# Patient Record
Sex: Female | Born: 1935
Health system: Southern US, Community
[De-identification: ages and names within clinical notes are randomized; demographics above are authoritative.]

## PROBLEM LIST (undated history)

## (undated) DIAGNOSIS — M8580 Other specified disorders of bone density and structure, unspecified site: Secondary | ICD-10-CM

## (undated) DIAGNOSIS — R42 Dizziness and giddiness: Secondary | ICD-10-CM

## (undated) DIAGNOSIS — M19012 Primary osteoarthritis, left shoulder: Secondary | ICD-10-CM

## (undated) DIAGNOSIS — K219 Gastro-esophageal reflux disease without esophagitis: Secondary | ICD-10-CM

## (undated) DIAGNOSIS — K449 Diaphragmatic hernia without obstruction or gangrene: Secondary | ICD-10-CM

## (undated) DIAGNOSIS — Z86718 Personal history of other venous thrombosis and embolism: Secondary | ICD-10-CM

## (undated) DIAGNOSIS — M79672 Pain in left foot: Secondary | ICD-10-CM

## (undated) DIAGNOSIS — J449 Chronic obstructive pulmonary disease, unspecified: Secondary | ICD-10-CM

## (undated) DIAGNOSIS — R32 Unspecified urinary incontinence: Secondary | ICD-10-CM

## (undated) DIAGNOSIS — K589 Irritable bowel syndrome without diarrhea: Secondary | ICD-10-CM

## (undated) DIAGNOSIS — E2740 Unspecified adrenocortical insufficiency: Secondary | ICD-10-CM

## (undated) DIAGNOSIS — J40 Bronchitis, not specified as acute or chronic: Secondary | ICD-10-CM

## (undated) DIAGNOSIS — C801 Malignant (primary) neoplasm, unspecified: Secondary | ICD-10-CM

## (undated) DIAGNOSIS — F32A Depression, unspecified: Secondary | ICD-10-CM

## (undated) DIAGNOSIS — M1990 Unspecified osteoarthritis, unspecified site: Secondary | ICD-10-CM

## (undated) DIAGNOSIS — R011 Cardiac murmur, unspecified: Secondary | ICD-10-CM

## (undated) DIAGNOSIS — R0602 Shortness of breath: Secondary | ICD-10-CM

## (undated) DIAGNOSIS — I209 Angina pectoris, unspecified: Secondary | ICD-10-CM

## (undated) DIAGNOSIS — I10 Essential (primary) hypertension: Secondary | ICD-10-CM

## (undated) DIAGNOSIS — I2699 Other pulmonary embolism without acute cor pulmonale: Secondary | ICD-10-CM

## (undated) DIAGNOSIS — D759 Disease of blood and blood-forming organs, unspecified: Secondary | ICD-10-CM

## (undated) DIAGNOSIS — M069 Rheumatoid arthritis, unspecified: Secondary | ICD-10-CM

## (undated) DIAGNOSIS — E538 Deficiency of other specified B group vitamins: Secondary | ICD-10-CM

## (undated) DIAGNOSIS — H269 Unspecified cataract: Secondary | ICD-10-CM

## (undated) DIAGNOSIS — Z9289 Personal history of other medical treatment: Secondary | ICD-10-CM

## (undated) DIAGNOSIS — M797 Fibromyalgia: Secondary | ICD-10-CM

## (undated) DIAGNOSIS — I38 Endocarditis, valve unspecified: Secondary | ICD-10-CM

## (undated) DIAGNOSIS — D649 Anemia, unspecified: Secondary | ICD-10-CM

## (undated) DIAGNOSIS — K760 Fatty (change of) liver, not elsewhere classified: Secondary | ICD-10-CM

## (undated) DIAGNOSIS — F329 Major depressive disorder, single episode, unspecified: Secondary | ICD-10-CM

## (undated) HISTORY — PX: WRIST SURGERY: SHX841

## (undated) HISTORY — DX: Gastro-esophageal reflux disease without esophagitis: K21.9

## (undated) HISTORY — DX: Rheumatoid arthritis, unspecified: M06.9

## (undated) HISTORY — DX: Personal history of other venous thrombosis and embolism: Z86.718

## (undated) HISTORY — DX: Diaphragmatic hernia without obstruction or gangrene: K44.9

## (undated) HISTORY — DX: Chronic obstructive pulmonary disease, unspecified: J44.9

## (undated) HISTORY — DX: Deficiency of other specified B group vitamins: E53.8

## (undated) HISTORY — DX: Other pulmonary embolism without acute cor pulmonale: I26.99

## (undated) HISTORY — PX: SKIN CANCER EXCISION: SHX779

## (undated) HISTORY — PX: ABDOMINAL HYSTERECTOMY: SHX81

## (undated) HISTORY — PX: TOTAL KNEE ARTHROPLASTY: SHX125

## (undated) HISTORY — PX: COSMETIC SURGERY: SHX468

## (undated) HISTORY — PX: JOINT REPLACEMENT: SHX530

## (undated) HISTORY — DX: Fatty (change of) liver, not elsewhere classified: K76.0

## (undated) HISTORY — PX: CATARACT EXTRACTION: SUR2

## (undated) HISTORY — DX: Essential (primary) hypertension: I10

## (undated) HISTORY — PX: BREAST BIOPSY: SHX20

## (undated) HISTORY — PX: BILATERAL OOPHORECTOMY: SHX1221

## (undated) HISTORY — DX: Personal history of other medical treatment: Z92.89

## (undated) HISTORY — DX: Fibromyalgia: M79.7

## (undated) HISTORY — DX: Unspecified adrenocortical insufficiency: E27.40

## (undated) HISTORY — DX: Depression, unspecified: F32.A

## (undated) HISTORY — DX: Pain in left foot: M79.672

## (undated) HISTORY — DX: Other specified disorders of bone density and structure, unspecified site: M85.80

## (undated) HISTORY — DX: Irritable bowel syndrome, unspecified: K58.9

## (undated) HISTORY — DX: Major depressive disorder, single episode, unspecified: F32.9

## (undated) HISTORY — PX: COLONOSCOPY: SHX174

## (undated) HISTORY — DX: Unspecified osteoarthritis, unspecified site: M19.90

---
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
## 1999-07-03 ENCOUNTER — Other Ambulatory Visit: Admission: RE | Admit: 1999-07-03 | Discharge: 1999-07-03 | Payer: Self-pay | Admitting: Otolaryngology

## 1999-08-14 ENCOUNTER — Encounter: Admission: RE | Admit: 1999-08-14 | Discharge: 1999-08-14 | Payer: Self-pay | Admitting: *Deleted

## 1999-08-14 ENCOUNTER — Encounter: Payer: Self-pay | Admitting: *Deleted

## 2000-03-26 ENCOUNTER — Encounter: Payer: Self-pay | Admitting: Internal Medicine

## 2000-03-26 ENCOUNTER — Ambulatory Visit (HOSPITAL_COMMUNITY): Admission: RE | Admit: 2000-03-26 | Discharge: 2000-03-26 | Payer: Self-pay | Admitting: Internal Medicine

## 2000-03-27 ENCOUNTER — Ambulatory Visit (HOSPITAL_COMMUNITY): Admission: RE | Admit: 2000-03-27 | Discharge: 2000-03-27 | Payer: Self-pay | Admitting: Internal Medicine

## 2000-03-27 ENCOUNTER — Encounter: Payer: Self-pay | Admitting: Internal Medicine

## 2000-04-22 ENCOUNTER — Encounter: Payer: Self-pay | Admitting: Internal Medicine

## 2000-04-29 ENCOUNTER — Encounter: Payer: Self-pay | Admitting: Oncology

## 2000-04-29 ENCOUNTER — Encounter: Admission: RE | Admit: 2000-04-29 | Discharge: 2000-04-29 | Payer: Self-pay | Admitting: Oncology

## 2000-05-14 ENCOUNTER — Ambulatory Visit (HOSPITAL_COMMUNITY): Admission: RE | Admit: 2000-05-14 | Discharge: 2000-05-14 | Payer: Self-pay | Admitting: Oncology

## 2000-05-14 ENCOUNTER — Encounter (INDEPENDENT_AMBULATORY_CARE_PROVIDER_SITE_OTHER): Payer: Self-pay | Admitting: Specialist

## 2000-09-17 ENCOUNTER — Inpatient Hospital Stay (HOSPITAL_COMMUNITY): Admission: EM | Admit: 2000-09-17 | Discharge: 2000-09-24 | Payer: Self-pay | Admitting: Emergency Medicine

## 2000-09-17 ENCOUNTER — Encounter: Payer: Self-pay | Admitting: Emergency Medicine

## 2001-05-04 HISTORY — PX: CHOLECYSTECTOMY: SHX55

## 2001-09-01 ENCOUNTER — Encounter: Admission: RE | Admit: 2001-09-01 | Discharge: 2001-09-01 | Payer: Self-pay | Admitting: Oncology

## 2001-09-01 ENCOUNTER — Encounter: Payer: Self-pay | Admitting: Oncology

## 2001-09-02 ENCOUNTER — Encounter (INDEPENDENT_AMBULATORY_CARE_PROVIDER_SITE_OTHER): Payer: Self-pay | Admitting: Specialist

## 2001-09-02 ENCOUNTER — Inpatient Hospital Stay (HOSPITAL_COMMUNITY): Admission: EM | Admit: 2001-09-02 | Discharge: 2001-09-08 | Payer: Self-pay | Admitting: Emergency Medicine

## 2001-09-03 ENCOUNTER — Encounter: Payer: Self-pay | Admitting: Internal Medicine

## 2001-09-30 ENCOUNTER — Encounter: Admission: RE | Admit: 2001-09-30 | Discharge: 2001-09-30 | Payer: Self-pay | Admitting: General Surgery

## 2001-09-30 ENCOUNTER — Encounter: Payer: Self-pay | Admitting: General Surgery

## 2001-11-15 ENCOUNTER — Ambulatory Visit (HOSPITAL_COMMUNITY): Admission: RE | Admit: 2001-11-15 | Discharge: 2001-11-15 | Payer: Self-pay | Admitting: Oncology

## 2001-11-15 ENCOUNTER — Encounter (INDEPENDENT_AMBULATORY_CARE_PROVIDER_SITE_OTHER): Payer: Self-pay | Admitting: Specialist

## 2002-02-23 ENCOUNTER — Encounter: Payer: Self-pay | Admitting: Internal Medicine

## 2002-02-23 ENCOUNTER — Encounter: Admission: RE | Admit: 2002-02-23 | Discharge: 2002-02-23 | Payer: Self-pay | Admitting: Internal Medicine

## 2002-11-23 ENCOUNTER — Encounter: Payer: Self-pay | Admitting: Emergency Medicine

## 2002-11-23 ENCOUNTER — Emergency Department (HOSPITAL_COMMUNITY): Admission: EM | Admit: 2002-11-23 | Discharge: 2002-11-23 | Payer: Self-pay | Admitting: Emergency Medicine

## 2003-07-31 ENCOUNTER — Encounter: Admission: RE | Admit: 2003-07-31 | Discharge: 2003-07-31 | Payer: Self-pay | Admitting: Internal Medicine

## 2003-08-06 ENCOUNTER — Inpatient Hospital Stay (HOSPITAL_COMMUNITY): Admission: RE | Admit: 2003-08-06 | Discharge: 2003-08-10 | Payer: Self-pay | Admitting: Orthopedic Surgery

## 2004-01-10 ENCOUNTER — Ambulatory Visit: Admission: RE | Admit: 2004-01-10 | Discharge: 2004-01-10 | Payer: Self-pay | Admitting: Orthopedic Surgery

## 2004-01-28 ENCOUNTER — Inpatient Hospital Stay (HOSPITAL_COMMUNITY): Admission: RE | Admit: 2004-01-28 | Discharge: 2004-02-01 | Payer: Self-pay | Admitting: Orthopedic Surgery

## 2004-03-17 ENCOUNTER — Ambulatory Visit: Payer: Self-pay | Admitting: Oncology

## 2004-03-24 ENCOUNTER — Ambulatory Visit: Payer: Self-pay | Admitting: Internal Medicine

## 2004-04-18 ENCOUNTER — Ambulatory Visit (HOSPITAL_COMMUNITY): Admission: RE | Admit: 2004-04-18 | Discharge: 2004-04-18 | Payer: Self-pay | Admitting: Hematology & Oncology

## 2004-05-02 ENCOUNTER — Ambulatory Visit: Payer: Self-pay | Admitting: Oncology

## 2004-05-20 ENCOUNTER — Ambulatory Visit: Payer: Self-pay | Admitting: Internal Medicine

## 2004-06-18 ENCOUNTER — Ambulatory Visit: Payer: Self-pay | Admitting: Oncology

## 2004-06-20 ENCOUNTER — Ambulatory Visit: Payer: Self-pay | Admitting: Internal Medicine

## 2004-06-25 ENCOUNTER — Ambulatory Visit (HOSPITAL_COMMUNITY): Admission: RE | Admit: 2004-06-25 | Discharge: 2004-06-25 | Payer: Self-pay | Admitting: Internal Medicine

## 2004-07-23 ENCOUNTER — Ambulatory Visit: Payer: Self-pay | Admitting: Internal Medicine

## 2004-08-06 ENCOUNTER — Ambulatory Visit: Payer: Self-pay | Admitting: Oncology

## 2004-08-29 ENCOUNTER — Encounter: Admission: RE | Admit: 2004-08-29 | Discharge: 2004-08-29 | Payer: Self-pay | Admitting: Oncology

## 2004-09-02 ENCOUNTER — Ambulatory Visit: Payer: Self-pay | Admitting: Internal Medicine

## 2004-09-18 ENCOUNTER — Ambulatory Visit: Payer: Self-pay | Admitting: Endocrinology

## 2004-09-22 ENCOUNTER — Ambulatory Visit: Payer: Self-pay | Admitting: Oncology

## 2004-10-01 ENCOUNTER — Encounter: Admission: RE | Admit: 2004-10-01 | Discharge: 2004-10-01 | Payer: Self-pay | Admitting: Oncology

## 2004-10-08 ENCOUNTER — Ambulatory Visit: Payer: Self-pay | Admitting: Internal Medicine

## 2004-10-22 ENCOUNTER — Ambulatory Visit: Payer: Self-pay | Admitting: Internal Medicine

## 2004-11-11 ENCOUNTER — Ambulatory Visit: Payer: Self-pay | Admitting: Oncology

## 2004-12-03 ENCOUNTER — Ambulatory Visit: Payer: Self-pay | Admitting: Internal Medicine

## 2005-02-04 ENCOUNTER — Ambulatory Visit: Payer: Self-pay | Admitting: Internal Medicine

## 2005-03-04 ENCOUNTER — Ambulatory Visit: Payer: Self-pay | Admitting: Internal Medicine

## 2005-03-12 ENCOUNTER — Ambulatory Visit: Payer: Self-pay | Admitting: Internal Medicine

## 2005-03-17 ENCOUNTER — Ambulatory Visit: Payer: Self-pay | Admitting: Oncology

## 2005-03-23 ENCOUNTER — Ambulatory Visit: Payer: Self-pay | Admitting: Internal Medicine

## 2005-04-08 ENCOUNTER — Ambulatory Visit (HOSPITAL_COMMUNITY): Admission: RE | Admit: 2005-04-08 | Discharge: 2005-04-08 | Payer: Self-pay | Admitting: Internal Medicine

## 2005-04-08 ENCOUNTER — Ambulatory Visit: Payer: Self-pay | Admitting: Internal Medicine

## 2005-05-27 ENCOUNTER — Ambulatory Visit: Payer: Self-pay | Admitting: Internal Medicine

## 2005-05-30 ENCOUNTER — Emergency Department (HOSPITAL_COMMUNITY): Admission: EM | Admit: 2005-05-30 | Discharge: 2005-05-30 | Payer: Self-pay | Admitting: *Deleted

## 2005-06-09 ENCOUNTER — Ambulatory Visit: Payer: Self-pay | Admitting: Internal Medicine

## 2005-06-15 ENCOUNTER — Ambulatory Visit (HOSPITAL_COMMUNITY): Admission: RE | Admit: 2005-06-15 | Discharge: 2005-06-15 | Payer: Self-pay | Admitting: Oncology

## 2005-06-17 ENCOUNTER — Ambulatory Visit: Payer: Self-pay | Admitting: Oncology

## 2005-07-27 ENCOUNTER — Ambulatory Visit: Payer: Self-pay | Admitting: Internal Medicine

## 2005-08-28 ENCOUNTER — Ambulatory Visit: Payer: Self-pay | Admitting: Oncology

## 2005-08-31 LAB — CBC & DIFF AND RETIC
Basophils Absolute: 0 10*3/uL (ref 0.0–0.1)
EOS%: 2.1 % (ref 0.0–7.0)
HCT: 40.2 % (ref 34.8–46.6)
HGB: 13.7 g/dL (ref 11.6–15.9)
MCH: 32.7 pg (ref 26.0–34.0)
MCV: 95.9 fL (ref 81.0–101.0)
MONO%: 7.9 % (ref 0.0–13.0)
NEUT%: 48.6 % (ref 39.6–76.8)

## 2005-08-31 LAB — COMPREHENSIVE METABOLIC PANEL
ALT: 16 U/L (ref 0–40)
Alkaline Phosphatase: 75 U/L (ref 39–117)
Calcium: 9.3 mg/dL (ref 8.4–10.5)
Glucose, Bld: 96 mg/dL (ref 70–99)
Potassium: 4.1 mEq/L (ref 3.5–5.3)
Sodium: 138 mEq/L (ref 135–145)

## 2005-08-31 LAB — IRON AND TIBC
Iron: 97 ug/dL (ref 42–145)
UIBC: 225 ug/dL

## 2005-08-31 LAB — HEPARIN ANTI-XA: Heparin LMW: 0.11 IU/mL

## 2005-08-31 LAB — FERRITIN: Ferritin: 112 ng/mL (ref 10–291)

## 2005-08-31 LAB — LACTATE DEHYDROGENASE: LDH: 177 U/L (ref 94–250)

## 2005-08-31 LAB — CHCC SMEAR

## 2005-09-01 ENCOUNTER — Encounter: Admission: RE | Admit: 2005-09-01 | Discharge: 2005-09-01 | Payer: Self-pay | Admitting: Internal Medicine

## 2005-09-02 LAB — PROTEIN ELECTROPHORESIS, SERUM
Alpha-1-Globulin: 5.4 % — ABNORMAL HIGH (ref 2.9–4.9)
Alpha-2-Globulin: 11.2 % (ref 7.1–11.8)
Beta 2: 6 % (ref 3.2–6.5)
Beta Globulin: 5.9 % (ref 4.7–7.2)
Gamma Globulin: 14.7 % (ref 11.1–18.8)

## 2005-09-14 ENCOUNTER — Ambulatory Visit: Payer: Self-pay | Admitting: Internal Medicine

## 2005-11-02 ENCOUNTER — Ambulatory Visit: Payer: Self-pay | Admitting: Internal Medicine

## 2005-11-18 ENCOUNTER — Ambulatory Visit: Payer: Self-pay | Admitting: Internal Medicine

## 2005-12-04 ENCOUNTER — Encounter (INDEPENDENT_AMBULATORY_CARE_PROVIDER_SITE_OTHER): Payer: Self-pay | Admitting: *Deleted

## 2005-12-04 ENCOUNTER — Ambulatory Visit (HOSPITAL_BASED_OUTPATIENT_CLINIC_OR_DEPARTMENT_OTHER): Admission: RE | Admit: 2005-12-04 | Discharge: 2005-12-04 | Payer: Self-pay | Admitting: Plastic Surgery

## 2005-12-14 ENCOUNTER — Ambulatory Visit: Payer: Self-pay | Admitting: Internal Medicine

## 2006-01-20 ENCOUNTER — Ambulatory Visit: Payer: Self-pay | Admitting: Internal Medicine

## 2006-01-27 ENCOUNTER — Ambulatory Visit: Payer: Self-pay | Admitting: Oncology

## 2006-03-29 ENCOUNTER — Ambulatory Visit: Payer: Self-pay | Admitting: Internal Medicine

## 2006-04-07 ENCOUNTER — Ambulatory Visit: Payer: Self-pay | Admitting: Oncology

## 2006-04-29 LAB — PROTIME-INR: INR: 1.5 — ABNORMAL LOW (ref 2.00–3.50)

## 2006-05-04 DIAGNOSIS — Z86718 Personal history of other venous thrombosis and embolism: Secondary | ICD-10-CM

## 2006-05-04 HISTORY — DX: Personal history of other venous thrombosis and embolism: Z86.718

## 2006-05-07 LAB — CBC & DIFF AND RETIC
BASO%: 0.5 % (ref 0.0–2.0)
HCT: 37.7 % (ref 34.8–46.6)
LYMPH%: 26.9 % (ref 14.0–48.0)
MCHC: 33.3 g/dL (ref 32.0–36.0)
MCV: 95.4 fL (ref 81.0–101.0)
MONO#: 0.3 10*3/uL (ref 0.1–0.9)
MONO%: 5.7 % (ref 0.0–13.0)
NEUT%: 65.2 % (ref 39.6–76.8)
Platelets: 200 10*3/uL (ref 145–400)
RBC: 3.96 10*6/uL (ref 3.70–5.32)
Retic %: 0.8 % (ref 0.4–2.3)

## 2006-05-07 LAB — PROTIME-INR
INR: 1.3 — ABNORMAL LOW (ref 2.00–3.50)
Protime: 15.6 Seconds — ABNORMAL HIGH (ref 10.6–13.4)

## 2006-05-11 LAB — COMPREHENSIVE METABOLIC PANEL
AST: 11 U/L (ref 0–37)
Albumin: 3.9 g/dL (ref 3.5–5.2)
Alkaline Phosphatase: 77 U/L (ref 39–117)
Calcium: 8.6 mg/dL (ref 8.4–10.5)
Chloride: 108 mEq/L (ref 96–112)
Potassium: 3.5 mEq/L (ref 3.5–5.3)
Sodium: 142 mEq/L (ref 135–145)
Total Protein: 6.5 g/dL (ref 6.0–8.3)

## 2006-05-11 LAB — SPEP & IFE WITH QIG
Albumin ELP: 57.3 % (ref 55.8–66.1)
Alpha-2-Globulin: 9.6 % (ref 7.1–11.8)
Beta Globulin: 6.6 % (ref 4.7–7.2)
IgA: 200 mg/dL (ref 68–378)
IgG (Immunoglobin G), Serum: 1110 mg/dL (ref 694–1618)
Total Protein, Serum Electrophoresis: 6.5 g/dL (ref 6.0–8.3)

## 2006-05-11 LAB — IRON AND TIBC
%SAT: 32 % (ref 20–55)
TIBC: 295 ug/dL (ref 250–470)
UIBC: 201 ug/dL

## 2006-05-13 ENCOUNTER — Ambulatory Visit (HOSPITAL_COMMUNITY): Admission: RE | Admit: 2006-05-13 | Discharge: 2006-05-13 | Payer: Self-pay | Admitting: Oncology

## 2006-05-31 ENCOUNTER — Ambulatory Visit: Payer: Self-pay | Admitting: Oncology

## 2006-06-04 LAB — PROTIME-INR

## 2006-06-18 ENCOUNTER — Ambulatory Visit: Payer: Self-pay | Admitting: Internal Medicine

## 2006-06-18 LAB — CONVERTED CEMR LAB
BUN: 30 mg/dL — ABNORMAL HIGH (ref 6–23)
Calcium: 8.8 mg/dL (ref 8.4–10.5)
Chloride: 100 meq/L (ref 96–112)
Creatinine, Ser: 1.2 mg/dL (ref 0.4–1.2)
Pro B Natriuretic peptide (BNP): 33 pg/mL (ref 0.0–100.0)
Sodium: 141 meq/L (ref 135–145)
TSH: 2.98 microintl units/mL (ref 0.35–5.50)
Vit D, 1,25-Dihydroxy: 8 — ABNORMAL LOW (ref 20–57)

## 2006-06-18 LAB — PROTIME-INR
INR: 1.7 — ABNORMAL LOW (ref 2.00–3.50)
Protime: 20.4 Seconds — ABNORMAL HIGH (ref 10.6–13.4)

## 2006-07-01 LAB — PROTIME-INR: Protime: 39.6 Seconds — ABNORMAL HIGH (ref 10.6–13.4)

## 2006-07-10 ENCOUNTER — Emergency Department (HOSPITAL_COMMUNITY): Admission: EM | Admit: 2006-07-10 | Discharge: 2006-07-10 | Payer: Self-pay | Admitting: Emergency Medicine

## 2006-07-16 ENCOUNTER — Emergency Department (HOSPITAL_COMMUNITY): Admission: EM | Admit: 2006-07-16 | Discharge: 2006-07-16 | Payer: Self-pay | Admitting: Family Medicine

## 2006-07-20 ENCOUNTER — Ambulatory Visit: Payer: Self-pay | Admitting: Internal Medicine

## 2006-08-11 ENCOUNTER — Ambulatory Visit: Payer: Self-pay | Admitting: Oncology

## 2006-08-11 LAB — PROTIME-INR
INR: 2.4 (ref 2.00–3.50)
Protime: 28.8 Seconds — ABNORMAL HIGH (ref 10.6–13.4)

## 2006-08-20 LAB — CBC WITH DIFFERENTIAL/PLATELET
BASO%: 0.8 % (ref 0.0–2.0)
EOS%: 4.1 % (ref 0.0–7.0)
LYMPH%: 39 % (ref 14.0–48.0)
MCHC: 35.2 g/dL (ref 32.0–36.0)
MONO#: 0.4 10*3/uL (ref 0.1–0.9)
Platelets: 232 10*3/uL (ref 145–400)
RBC: 3.99 10*6/uL (ref 3.70–5.32)
WBC: 4.5 10*3/uL (ref 3.9–10.0)
lymph#: 1.7 10*3/uL (ref 0.9–3.3)

## 2006-08-20 LAB — PROTIME-INR
INR: 3.9 — ABNORMAL HIGH (ref 2.00–3.50)
Protime: 46.8 Seconds — ABNORMAL HIGH (ref 10.6–13.4)

## 2006-08-25 LAB — COMPREHENSIVE METABOLIC PANEL
Alkaline Phosphatase: 71 U/L (ref 39–117)
BUN: 22 mg/dL (ref 6–23)
Creatinine, Ser: 1.23 mg/dL — ABNORMAL HIGH (ref 0.40–1.20)
Glucose, Bld: 86 mg/dL (ref 70–99)
Sodium: 139 mEq/L (ref 135–145)
Total Bilirubin: 0.4 mg/dL (ref 0.3–1.2)
Total Protein: 6.6 g/dL (ref 6.0–8.3)

## 2006-08-25 LAB — IRON AND TIBC
%SAT: 13 % — ABNORMAL LOW (ref 20–55)
Iron: 33 ug/dL — ABNORMAL LOW (ref 42–145)
TIBC: 264 ug/dL (ref 250–470)
UIBC: 231 ug/dL

## 2006-08-25 LAB — FERRITIN: Ferritin: 109 ng/mL (ref 10–291)

## 2006-08-25 LAB — SPEP & IFE WITH QIG
Albumin ELP: 54.9 % — ABNORMAL LOW (ref 55.8–66.1)
Alpha-1-Globulin: 5.7 % — ABNORMAL HIGH (ref 2.9–4.9)
Alpha-2-Globulin: 11.3 % (ref 7.1–11.8)
Beta 2: 5.9 % (ref 3.2–6.5)
Beta Globulin: 6.3 % (ref 4.7–7.2)
Gamma Globulin: 15.9 % (ref 11.1–18.8)

## 2006-08-27 LAB — CBC WITH DIFFERENTIAL/PLATELET
BASO%: 1.5 % (ref 0.0–2.0)
Eosinophils Absolute: 0.1 10*3/uL (ref 0.0–0.5)
HCT: 36.4 % (ref 34.8–46.6)
LYMPH%: 39.4 % (ref 14.0–48.0)
MONO#: 0.4 10*3/uL (ref 0.1–0.9)
NEUT#: 2.1 10*3/uL (ref 1.5–6.5)
NEUT%: 47.9 % (ref 39.6–76.8)
Platelets: 187 10*3/uL (ref 145–400)
WBC: 4.4 10*3/uL (ref 3.9–10.0)
lymph#: 1.7 10*3/uL (ref 0.9–3.3)

## 2006-08-27 LAB — PROTIME-INR

## 2006-09-03 ENCOUNTER — Ambulatory Visit: Payer: Self-pay | Admitting: Internal Medicine

## 2006-09-03 LAB — CONVERTED CEMR LAB: Vit D, 1,25-Dihydroxy: 11 — ABNORMAL LOW (ref 20–57)

## 2006-09-14 LAB — PROTIME-INR: Protime: 28.8 Seconds — ABNORMAL HIGH (ref 10.6–13.4)

## 2006-09-29 ENCOUNTER — Ambulatory Visit: Payer: Self-pay | Admitting: Oncology

## 2006-09-29 ENCOUNTER — Inpatient Hospital Stay (HOSPITAL_COMMUNITY): Admission: EM | Admit: 2006-09-29 | Discharge: 2006-10-04 | Payer: Self-pay | Admitting: Emergency Medicine

## 2006-10-05 ENCOUNTER — Ambulatory Visit: Payer: Self-pay | Admitting: Oncology

## 2006-10-07 LAB — CBC WITH DIFFERENTIAL/PLATELET
BASO%: 3.1 % — ABNORMAL HIGH (ref 0.0–2.0)
Eosinophils Absolute: 0.2 10*3/uL (ref 0.0–0.5)
HCT: 34.1 % — ABNORMAL LOW (ref 34.8–46.6)
LYMPH%: 36.7 % (ref 14.0–48.0)
MCHC: 35.1 g/dL (ref 32.0–36.0)
MONO#: 0.6 10*3/uL (ref 0.1–0.9)
NEUT#: 2 10*3/uL (ref 1.5–6.5)
Platelets: 195 10*3/uL (ref 145–400)
RBC: 3.57 10*6/uL — ABNORMAL LOW (ref 3.70–5.32)
WBC: 4.7 10*3/uL (ref 3.9–10.0)
lymph#: 1.7 10*3/uL (ref 0.9–3.3)

## 2006-10-07 LAB — PROTIME-INR

## 2006-10-14 LAB — CBC WITH DIFFERENTIAL/PLATELET
BASO%: 1.5 % (ref 0.0–2.0)
HCT: 32 % — ABNORMAL LOW (ref 34.8–46.6)
HGB: 11.1 g/dL — ABNORMAL LOW (ref 11.6–15.9)
MCHC: 34.7 g/dL (ref 32.0–36.0)
MONO#: 0.7 10*3/uL (ref 0.1–0.9)
NEUT%: 41.7 % (ref 39.6–76.8)
WBC: 5.7 10*3/uL (ref 3.9–10.0)
lymph#: 2.3 10*3/uL (ref 0.9–3.3)

## 2006-10-14 LAB — PROTIME-INR: INR: 3.3 (ref 2.00–3.50)

## 2006-10-15 ENCOUNTER — Ambulatory Visit (HOSPITAL_COMMUNITY): Admission: RE | Admit: 2006-10-15 | Discharge: 2006-10-15 | Payer: Self-pay | Admitting: Internal Medicine

## 2006-10-26 ENCOUNTER — Encounter (INDEPENDENT_AMBULATORY_CARE_PROVIDER_SITE_OTHER): Payer: Self-pay | Admitting: Orthopedic Surgery

## 2006-10-26 ENCOUNTER — Ambulatory Visit (HOSPITAL_BASED_OUTPATIENT_CLINIC_OR_DEPARTMENT_OTHER): Admission: RE | Admit: 2006-10-26 | Discharge: 2006-10-26 | Payer: Self-pay | Admitting: Orthopedic Surgery

## 2006-10-29 ENCOUNTER — Ambulatory Visit: Admission: RE | Admit: 2006-10-29 | Discharge: 2006-10-29 | Payer: Self-pay | Admitting: Oncology

## 2006-10-29 ENCOUNTER — Ambulatory Visit: Payer: Self-pay | Admitting: Vascular Surgery

## 2006-10-29 LAB — CBC WITH DIFFERENTIAL/PLATELET
BASO%: 1.8 % (ref 0.0–2.0)
HCT: 30.4 % — ABNORMAL LOW (ref 34.8–46.6)
MCHC: 34.5 g/dL (ref 32.0–36.0)
MONO#: 0.5 10*3/uL (ref 0.1–0.9)
NEUT%: 38.5 % — ABNORMAL LOW (ref 39.6–76.8)
RBC: 3.22 10*6/uL — ABNORMAL LOW (ref 3.70–5.32)
RDW: 14.2 % (ref 11.3–14.5)
WBC: 4.9 10*3/uL (ref 3.9–10.0)
lymph#: 2.1 10*3/uL (ref 0.9–3.3)

## 2006-10-29 LAB — PROTIME-INR
INR: 1.1 — ABNORMAL LOW (ref 2.00–3.50)
Protime: 13.2 Seconds (ref 10.6–13.4)

## 2006-11-04 LAB — PROTIME-INR

## 2006-11-11 LAB — PROTIME-INR

## 2006-11-11 LAB — PROTHROMBIN TIME
INR: 4.7 — ABNORMAL HIGH (ref 0.0–1.5)
Prothrombin Time: 47.5 seconds — ABNORMAL HIGH (ref 11.6–15.2)

## 2006-11-23 ENCOUNTER — Encounter: Payer: Self-pay | Admitting: Internal Medicine

## 2006-11-23 DIAGNOSIS — M069 Rheumatoid arthritis, unspecified: Secondary | ICD-10-CM | POA: Insufficient documentation

## 2006-12-08 ENCOUNTER — Ambulatory Visit: Payer: Self-pay | Admitting: Internal Medicine

## 2006-12-08 LAB — CONVERTED CEMR LAB
Albumin: 3.6 g/dL (ref 3.5–5.2)
Bilirubin Urine: NEGATIVE
Bilirubin, Direct: 0.1 mg/dL (ref 0.0–0.3)
GFR calc non Af Amer: 66 mL/min
Glucose, Bld: 99 mg/dL (ref 70–99)
Leukocytes, UA: NEGATIVE
Nitrite: NEGATIVE
Potassium: 3.5 meq/L (ref 3.5–5.1)
Sodium: 142 meq/L (ref 135–145)
Specific Gravity, Urine: >=1.03 (ref 1.000–1.03)
Total Bilirubin: 0.8 mg/dL (ref 0.3–1.2)
Urine Glucose: NEGATIVE mg/dL
Urobilinogen, UA: 0.2 (ref 0.0–1.0)
Vitamin B-12: 435 pg/mL (ref 211–911)
pH: 6 (ref 5.0–8.0)

## 2007-02-16 ENCOUNTER — Ambulatory Visit: Payer: Self-pay | Admitting: Internal Medicine

## 2007-03-10 ENCOUNTER — Ambulatory Visit: Payer: Self-pay | Admitting: Internal Medicine

## 2007-03-10 DIAGNOSIS — R42 Dizziness and giddiness: Secondary | ICD-10-CM | POA: Insufficient documentation

## 2007-03-10 DIAGNOSIS — M8580 Other specified disorders of bone density and structure, unspecified site: Secondary | ICD-10-CM | POA: Insufficient documentation

## 2007-03-10 DIAGNOSIS — M1990 Unspecified osteoarthritis, unspecified site: Secondary | ICD-10-CM | POA: Insufficient documentation

## 2007-03-10 DIAGNOSIS — E538 Deficiency of other specified B group vitamins: Secondary | ICD-10-CM | POA: Insufficient documentation

## 2007-03-10 DIAGNOSIS — E559 Vitamin D deficiency, unspecified: Secondary | ICD-10-CM | POA: Insufficient documentation

## 2007-03-10 DIAGNOSIS — Z86718 Personal history of other venous thrombosis and embolism: Secondary | ICD-10-CM | POA: Insufficient documentation

## 2007-03-15 ENCOUNTER — Ambulatory Visit: Payer: Self-pay | Admitting: Oncology

## 2007-03-17 LAB — CBC & DIFF AND RETIC
Basophils Absolute: 0 10*3/uL (ref 0.0–0.1)
Eosinophils Absolute: 0.2 10*3/uL (ref 0.0–0.5)
HGB: 12.9 g/dL (ref 11.6–15.9)
IRF: 0.42 — ABNORMAL HIGH (ref 0.130–0.330)
MCV: 96 fL (ref 81.0–101.0)
MONO#: 0.5 10*3/uL (ref 0.1–0.9)
MONO%: 9.8 % (ref 0.0–13.0)
NEUT#: 2 10*3/uL (ref 1.5–6.5)
RBC: 3.78 10*6/uL (ref 3.70–5.32)
RDW: 14.1 % (ref 11.3–14.5)
RETIC #: 52.9 10*3/uL (ref 19.7–115.1)
Retic %: 1.4 % (ref 0.4–2.3)
WBC: 5.1 10*3/uL (ref 3.9–10.0)
lymph#: 2.3 10*3/uL (ref 0.9–3.3)

## 2007-03-17 LAB — COMPREHENSIVE METABOLIC PANEL
AST: 19 U/L (ref 0–37)
Albumin: 4 g/dL (ref 3.5–5.2)
Alkaline Phosphatase: 60 U/L (ref 39–117)
Glucose, Bld: 81 mg/dL (ref 70–99)
Potassium: 4.1 mEq/L (ref 3.5–5.3)
Sodium: 139 mEq/L (ref 135–145)
Total Bilirubin: 0.4 mg/dL (ref 0.3–1.2)
Total Protein: 6.9 g/dL (ref 6.0–8.3)

## 2007-03-17 LAB — CHCC SMEAR

## 2007-03-21 ENCOUNTER — Ambulatory Visit: Payer: Self-pay | Admitting: Cardiology

## 2007-04-06 ENCOUNTER — Telehealth: Payer: Self-pay | Admitting: Internal Medicine

## 2007-04-11 ENCOUNTER — Encounter: Payer: Self-pay | Admitting: Internal Medicine

## 2007-04-13 ENCOUNTER — Telehealth: Payer: Self-pay | Admitting: Internal Medicine

## 2007-04-18 ENCOUNTER — Telehealth: Payer: Self-pay | Admitting: Internal Medicine

## 2007-05-11 ENCOUNTER — Ambulatory Visit: Payer: Self-pay | Admitting: Internal Medicine

## 2007-05-11 DIAGNOSIS — R5383 Other fatigue: Secondary | ICD-10-CM

## 2007-05-11 DIAGNOSIS — F329 Major depressive disorder, single episode, unspecified: Secondary | ICD-10-CM

## 2007-05-11 DIAGNOSIS — R5381 Other malaise: Secondary | ICD-10-CM | POA: Insufficient documentation

## 2007-05-11 DIAGNOSIS — F32A Depression, unspecified: Secondary | ICD-10-CM | POA: Insufficient documentation

## 2007-05-11 DIAGNOSIS — R404 Transient alteration of awareness: Secondary | ICD-10-CM | POA: Insufficient documentation

## 2007-05-12 LAB — CONVERTED CEMR LAB
BUN: 15 mg/dL (ref 6–23)
Basophils Relative: 0.9 % (ref 0.0–1.0)
CO2: 30 meq/L (ref 19–32)
Calcium: 9.3 mg/dL (ref 8.4–10.5)
Creatinine, Ser: 1.1 mg/dL (ref 0.4–1.2)
Eosinophils Relative: 3.8 % (ref 0.0–5.0)
GFR calc Af Amer: 63 mL/min
Glucose, Bld: 98 mg/dL (ref 70–99)
Hemoglobin: 12.7 g/dL (ref 12.0–15.0)
Lymphocytes Relative: 46 % (ref 12.0–46.0)
Monocytes Relative: 7.5 % (ref 3.0–11.0)
Neutro Abs: 2.2 10*3/uL (ref 1.4–7.7)
Platelets: 145 10*3/uL — ABNORMAL LOW (ref 150–400)
RDW: 12.3 % (ref 11.5–14.6)
WBC: 5.3 10*3/uL (ref 4.5–10.5)

## 2007-05-17 LAB — CONVERTED CEMR LAB: Vit D, 1,25-Dihydroxy: 30 (ref 30–89)

## 2007-05-31 ENCOUNTER — Ambulatory Visit: Payer: Self-pay | Admitting: Internal Medicine

## 2007-05-31 DIAGNOSIS — N3090 Cystitis, unspecified without hematuria: Secondary | ICD-10-CM | POA: Insufficient documentation

## 2007-06-01 LAB — CONVERTED CEMR LAB
Nitrite: POSITIVE — AB
RBC / HPF: NONE SEEN
Specific Gravity, Urine: 1.025 (ref 1.000–1.03)
Squamous Epithelial / LPF: NEGATIVE /lpf
Total Protein, Urine: 100 mg/dL — AB
Urobilinogen, UA: >8 — AB (ref 0.0–1.0)
pH: 5 (ref 5.0–8.0)

## 2007-07-06 ENCOUNTER — Encounter: Payer: Self-pay | Admitting: Internal Medicine

## 2007-07-07 ENCOUNTER — Emergency Department (HOSPITAL_COMMUNITY): Admission: EM | Admit: 2007-07-07 | Discharge: 2007-07-07 | Payer: Self-pay | Admitting: Emergency Medicine

## 2007-07-07 ENCOUNTER — Telehealth: Payer: Self-pay | Admitting: Internal Medicine

## 2007-07-08 DIAGNOSIS — K589 Irritable bowel syndrome without diarrhea: Secondary | ICD-10-CM | POA: Insufficient documentation

## 2007-07-08 DIAGNOSIS — K219 Gastro-esophageal reflux disease without esophagitis: Secondary | ICD-10-CM | POA: Insufficient documentation

## 2007-07-08 DIAGNOSIS — M359 Systemic involvement of connective tissue, unspecified: Secondary | ICD-10-CM | POA: Insufficient documentation

## 2007-07-08 DIAGNOSIS — I82409 Acute embolism and thrombosis of unspecified deep veins of unspecified lower extremity: Secondary | ICD-10-CM | POA: Insufficient documentation

## 2007-07-08 DIAGNOSIS — G8929 Other chronic pain: Secondary | ICD-10-CM | POA: Insufficient documentation

## 2007-07-08 DIAGNOSIS — Z86718 Personal history of other venous thrombosis and embolism: Secondary | ICD-10-CM | POA: Insufficient documentation

## 2007-07-11 ENCOUNTER — Telehealth: Payer: Self-pay | Admitting: Internal Medicine

## 2007-07-13 ENCOUNTER — Ambulatory Visit: Payer: Self-pay | Admitting: Internal Medicine

## 2007-07-13 DIAGNOSIS — R079 Chest pain, unspecified: Secondary | ICD-10-CM | POA: Insufficient documentation

## 2007-07-13 DIAGNOSIS — J189 Pneumonia, unspecified organism: Secondary | ICD-10-CM | POA: Insufficient documentation

## 2007-07-28 ENCOUNTER — Telehealth (INDEPENDENT_AMBULATORY_CARE_PROVIDER_SITE_OTHER): Payer: Self-pay | Admitting: *Deleted

## 2007-08-03 ENCOUNTER — Encounter: Payer: Self-pay | Admitting: Internal Medicine

## 2007-08-29 ENCOUNTER — Telehealth: Payer: Self-pay | Admitting: Internal Medicine

## 2007-09-14 ENCOUNTER — Ambulatory Visit: Payer: Self-pay | Admitting: Internal Medicine

## 2007-09-30 ENCOUNTER — Ambulatory Visit: Payer: Self-pay | Admitting: Oncology

## 2007-11-16 ENCOUNTER — Ambulatory Visit: Payer: Self-pay | Admitting: Internal Medicine

## 2007-12-08 ENCOUNTER — Encounter: Payer: Self-pay | Admitting: Internal Medicine

## 2007-12-21 ENCOUNTER — Telehealth: Payer: Self-pay | Admitting: Internal Medicine

## 2007-12-27 ENCOUNTER — Telehealth: Payer: Self-pay | Admitting: Internal Medicine

## 2008-01-09 ENCOUNTER — Encounter (INDEPENDENT_AMBULATORY_CARE_PROVIDER_SITE_OTHER): Payer: Self-pay | Admitting: Emergency Medicine

## 2008-01-09 ENCOUNTER — Encounter: Payer: Self-pay | Admitting: Internal Medicine

## 2008-01-09 ENCOUNTER — Inpatient Hospital Stay (HOSPITAL_COMMUNITY): Admission: EM | Admit: 2008-01-09 | Discharge: 2008-01-12 | Payer: Self-pay | Admitting: Emergency Medicine

## 2008-01-09 ENCOUNTER — Ambulatory Visit: Payer: Self-pay | Admitting: Surgery

## 2008-01-09 ENCOUNTER — Ambulatory Visit: Payer: Self-pay | Admitting: Internal Medicine

## 2008-01-09 DIAGNOSIS — R609 Edema, unspecified: Secondary | ICD-10-CM | POA: Insufficient documentation

## 2008-01-10 ENCOUNTER — Ambulatory Visit: Payer: Self-pay | Admitting: Oncology

## 2008-01-20 ENCOUNTER — Encounter: Payer: Self-pay | Admitting: Internal Medicine

## 2008-01-24 ENCOUNTER — Ambulatory Visit: Payer: Self-pay | Admitting: Oncology

## 2008-01-31 ENCOUNTER — Telehealth: Payer: Self-pay | Admitting: Internal Medicine

## 2008-02-01 ENCOUNTER — Ambulatory Visit: Payer: Self-pay | Admitting: Internal Medicine

## 2008-02-06 LAB — CONVERTED CEMR LAB
ALT: 26 units/L (ref 0–35)
BUN: 16 mg/dL (ref 6–23)
CO2: 28 meq/L (ref 19–32)
Calcium: 9.5 mg/dL (ref 8.4–10.5)
Eosinophils Relative: 3.3 % (ref 0.0–5.0)
Glucose, Bld: 110 mg/dL — ABNORMAL HIGH (ref 70–99)
HCT: 43.2 % (ref 36.0–46.0)
Hemoglobin: 15 g/dL (ref 12.0–15.0)
Lymphocytes Relative: 44.6 % (ref 12.0–46.0)
Monocytes Relative: 9.7 % (ref 3.0–12.0)
Neutro Abs: 1.8 10*3/uL (ref 1.4–7.7)
Potassium: 4 meq/L (ref 3.5–5.1)
RDW: 13.4 % (ref 11.5–14.6)
Sodium: 137 meq/L (ref 135–145)
WBC: 4.2 10*3/uL — ABNORMAL LOW (ref 4.5–10.5)

## 2008-02-09 LAB — CBC WITH DIFFERENTIAL/PLATELET
BASO%: 0.4 % (ref 0.0–2.0)
EOS%: 3.6 % (ref 0.0–7.0)
HCT: 41 % (ref 34.8–46.6)
LYMPH%: 44.9 % (ref 14.0–48.0)
MCH: 32.4 pg (ref 26.0–34.0)
MCHC: 34.4 g/dL (ref 32.0–36.0)
NEUT%: 42.4 % (ref 39.6–76.8)
RBC: 4.35 10*6/uL (ref 3.70–5.32)
lymph#: 1.4 10*3/uL (ref 0.9–3.3)

## 2008-02-13 ENCOUNTER — Telehealth: Payer: Self-pay | Admitting: Internal Medicine

## 2008-03-12 ENCOUNTER — Telehealth: Payer: Self-pay | Admitting: Internal Medicine

## 2008-04-04 ENCOUNTER — Ambulatory Visit: Payer: Self-pay | Admitting: Internal Medicine

## 2008-04-10 ENCOUNTER — Encounter: Payer: Self-pay | Admitting: Internal Medicine

## 2008-04-18 ENCOUNTER — Encounter: Payer: Self-pay | Admitting: Internal Medicine

## 2008-05-18 ENCOUNTER — Telehealth: Payer: Self-pay | Admitting: Internal Medicine

## 2008-05-30 ENCOUNTER — Ambulatory Visit: Payer: Self-pay | Admitting: Oncology

## 2008-05-31 ENCOUNTER — Telehealth: Payer: Self-pay | Admitting: Internal Medicine

## 2008-06-01 LAB — COMPREHENSIVE METABOLIC PANEL
AST: 20 U/L (ref 0–37)
Albumin: 3.9 g/dL (ref 3.5–5.2)
BUN: 22 mg/dL (ref 6–23)
Calcium: 9.1 mg/dL (ref 8.4–10.5)
Chloride: 102 mEq/L (ref 96–112)
Glucose, Bld: 96 mg/dL (ref 70–99)
Potassium: 4.2 mEq/L (ref 3.5–5.3)

## 2008-06-01 LAB — CBC WITH DIFFERENTIAL/PLATELET
Basophils Absolute: 0 10*3/uL (ref 0.0–0.1)
EOS%: 2.3 % (ref 0.0–7.0)
HCT: 43 % (ref 34.8–46.6)
HGB: 14.8 g/dL (ref 11.6–15.9)
LYMPH%: 42.8 % (ref 14.0–48.0)
MCH: 32.5 pg (ref 26.0–34.0)
MCV: 94.1 fL (ref 81.0–101.0)
MONO%: 7.1 % (ref 0.0–13.0)
NEUT%: 47.2 % (ref 39.6–76.8)

## 2008-06-01 LAB — HEPARIN ANTI-XA: Heparin LMW: 1.12 IU/mL

## 2008-06-04 ENCOUNTER — Telehealth: Payer: Self-pay | Admitting: Internal Medicine

## 2008-06-06 ENCOUNTER — Ambulatory Visit: Payer: Self-pay | Admitting: Internal Medicine

## 2008-06-06 DIAGNOSIS — J209 Acute bronchitis, unspecified: Secondary | ICD-10-CM | POA: Insufficient documentation

## 2008-06-06 LAB — CONVERTED CEMR LAB
Albumin: 3.6 g/dL (ref 3.5–5.2)
BUN: 21 mg/dL (ref 6–23)
Calcium: 9.1 mg/dL (ref 8.4–10.5)
Creatinine, Ser: 1.1 mg/dL (ref 0.4–1.2)
Eosinophils Relative: 3.6 % (ref 0.0–5.0)
GFR calc Af Amer: 63 mL/min
Glucose, Bld: 107 mg/dL — ABNORMAL HIGH (ref 70–99)
HCT: 41 % (ref 36.0–46.0)
Hemoglobin: 13.8 g/dL (ref 12.0–15.0)
MCV: 95.5 fL (ref 78.0–100.0)
Monocytes Absolute: 0.3 10*3/uL (ref 0.1–1.0)
Monocytes Relative: 9.4 % (ref 3.0–12.0)
Neutro Abs: 1.5 10*3/uL (ref 1.4–7.7)
Total Protein: 6.7 g/dL (ref 6.0–8.3)
WBC: 3.7 10*3/uL — ABNORMAL LOW (ref 4.5–10.5)

## 2008-06-12 ENCOUNTER — Encounter: Payer: Self-pay | Admitting: Internal Medicine

## 2008-08-10 ENCOUNTER — Ambulatory Visit: Payer: Self-pay | Admitting: Internal Medicine

## 2008-08-10 LAB — CONVERTED CEMR LAB
ALT: 14 units/L (ref 0–35)
Alkaline Phosphatase: 47 units/L (ref 39–117)
Basophils Relative: 1.4 % (ref 0.0–3.0)
Bilirubin, Direct: 0.1 mg/dL (ref 0.0–0.3)
Chloride: 107 meq/L (ref 96–112)
Creatinine, Ser: 1.1 mg/dL (ref 0.4–1.2)
Eosinophils Relative: 3.4 % (ref 0.0–5.0)
MCV: 96 fL (ref 78.0–100.0)
Monocytes Absolute: 0.4 10*3/uL (ref 0.1–1.0)
Neutrophils Relative %: 37.6 % — ABNORMAL LOW (ref 43.0–77.0)
Potassium: 4.6 meq/L (ref 3.5–5.1)
RBC: 3.76 M/uL — ABNORMAL LOW (ref 3.87–5.11)
Sodium: 141 meq/L (ref 135–145)
Total Protein: 6.3 g/dL (ref 6.0–8.3)
WBC: 4.2 10*3/uL — ABNORMAL LOW (ref 4.5–10.5)

## 2008-09-12 ENCOUNTER — Telehealth: Payer: Self-pay | Admitting: Internal Medicine

## 2008-09-14 ENCOUNTER — Ambulatory Visit: Payer: Self-pay | Admitting: Internal Medicine

## 2008-09-14 DIAGNOSIS — I2699 Other pulmonary embolism without acute cor pulmonale: Secondary | ICD-10-CM | POA: Insufficient documentation

## 2008-09-14 DIAGNOSIS — Z86711 Personal history of pulmonary embolism: Secondary | ICD-10-CM | POA: Insufficient documentation

## 2008-09-14 DIAGNOSIS — R1012 Left upper quadrant pain: Secondary | ICD-10-CM | POA: Insufficient documentation

## 2008-09-14 DIAGNOSIS — R1013 Epigastric pain: Secondary | ICD-10-CM | POA: Insufficient documentation

## 2008-09-14 DIAGNOSIS — K5900 Constipation, unspecified: Secondary | ICD-10-CM | POA: Insufficient documentation

## 2008-09-14 DIAGNOSIS — R1011 Right upper quadrant pain: Secondary | ICD-10-CM | POA: Insufficient documentation

## 2008-09-17 LAB — CONVERTED CEMR LAB
ALT: 17 units/L (ref 0–35)
AST: 17 units/L (ref 0–37)
Albumin: 3.8 g/dL (ref 3.5–5.2)
Basophils Absolute: 0 10*3/uL (ref 0.0–0.1)
Calcium: 9.1 mg/dL (ref 8.4–10.5)
Chloride: 106 meq/L (ref 96–112)
Eosinophils Absolute: 0.2 10*3/uL (ref 0.0–0.7)
Lymphocytes Relative: 40.7 % (ref 12.0–46.0)
MCHC: 34.2 g/dL (ref 30.0–36.0)
Neutrophils Relative %: 46.4 % (ref 43.0–77.0)
Platelets: 173 10*3/uL (ref 150.0–400.0)
Potassium: 4.8 meq/L (ref 3.5–5.1)
RBC: 4.1 M/uL (ref 3.87–5.11)
RDW: 12.7 % (ref 11.5–14.6)
Sodium: 141 meq/L (ref 135–145)

## 2008-09-18 ENCOUNTER — Ambulatory Visit (HOSPITAL_COMMUNITY): Admission: RE | Admit: 2008-09-18 | Discharge: 2008-09-18 | Payer: Self-pay | Admitting: Internal Medicine

## 2008-10-05 ENCOUNTER — Encounter: Payer: Self-pay | Admitting: Internal Medicine

## 2008-10-08 ENCOUNTER — Ambulatory Visit: Payer: Self-pay | Admitting: Oncology

## 2008-10-08 ENCOUNTER — Telehealth: Payer: Self-pay | Admitting: Internal Medicine

## 2008-10-12 ENCOUNTER — Telehealth: Payer: Self-pay | Admitting: Endocrinology

## 2008-10-22 ENCOUNTER — Ambulatory Visit: Payer: Self-pay | Admitting: Internal Medicine

## 2008-10-22 DIAGNOSIS — R05 Cough: Secondary | ICD-10-CM

## 2008-10-22 DIAGNOSIS — R059 Cough, unspecified: Secondary | ICD-10-CM | POA: Insufficient documentation

## 2008-10-22 LAB — CONVERTED CEMR LAB
AST: 24 units/L (ref 0–37)
Alkaline Phosphatase: 54 units/L (ref 39–117)
Basophils Absolute: 0 10*3/uL (ref 0.0–0.1)
Bilirubin, Direct: 0.1 mg/dL (ref 0.0–0.3)
Eosinophils Absolute: 0.2 10*3/uL (ref 0.0–0.7)
Eosinophils Relative: 4.7 % (ref 0.0–5.0)
Free T4: 0.9 ng/dL (ref 0.6–1.6)
GFR calc non Af Amer: 51.83 mL/min (ref >60)
HCT: 39.9 % (ref 36.0–46.0)
Leukocytes, UA: NEGATIVE
Lymphs Abs: 2.3 10*3/uL (ref 0.7–4.0)
MCV: 96.4 fL (ref 78.0–100.0)
Neutro Abs: 1.5 10*3/uL (ref 1.4–7.7)
Platelets: 174 10*3/uL (ref 150.0–400.0)
Potassium: 4 meq/L (ref 3.5–5.1)
RDW: 12.5 % (ref 11.5–14.6)
Sodium: 141 meq/L (ref 135–145)
Specific Gravity, Urine: 1.025 (ref 1.000–1.030)
T3, Free: 2.8 pg/mL (ref 2.3–4.2)
Tissue Transglutaminase Ab, IgA: 0.6 units (ref <7)
Total Protein: 7.4 g/dL (ref 6.0–8.3)
Urobilinogen, UA: 0.2 (ref 0.0–1.0)
Vit D, 25-Hydroxy: 18 ng/mL — ABNORMAL LOW (ref 30–89)
pH: 5.5 (ref 5.0–8.0)

## 2008-11-28 ENCOUNTER — Ambulatory Visit: Payer: Self-pay | Admitting: Oncology

## 2008-12-03 ENCOUNTER — Encounter: Payer: Self-pay | Admitting: Internal Medicine

## 2008-12-03 LAB — CBC & DIFF AND RETIC
BASO%: 0.5 % (ref 0.0–2.0)
EOS%: 2 % (ref 0.0–7.0)
HGB: 13.2 g/dL (ref 11.6–15.9)
Immature Retic Fract: 4.2 % (ref 0.00–10.70)
MCH: 31.7 pg (ref 25.1–34.0)
MCHC: 34.4 g/dL (ref 31.5–36.0)
RDW: 13.3 % (ref 11.2–14.5)
Retic Ct Abs: 38.78 10*3/uL (ref 18.30–72.70)
lymph#: 1.7 10*3/uL (ref 0.9–3.3)

## 2008-12-03 LAB — COMPREHENSIVE METABOLIC PANEL
ALT: 14 U/L (ref 0–35)
AST: 17 U/L (ref 0–37)
Creatinine, Ser: 1.17 mg/dL (ref 0.40–1.20)
Total Bilirubin: 0.4 mg/dL (ref 0.3–1.2)

## 2008-12-03 LAB — CHCC SMEAR

## 2008-12-12 ENCOUNTER — Encounter: Payer: Self-pay | Admitting: Internal Medicine

## 2008-12-18 ENCOUNTER — Ambulatory Visit: Payer: Self-pay | Admitting: Internal Medicine

## 2009-01-10 ENCOUNTER — Encounter: Payer: Self-pay | Admitting: Internal Medicine

## 2009-01-18 ENCOUNTER — Encounter: Payer: Self-pay | Admitting: Internal Medicine

## 2009-01-25 ENCOUNTER — Encounter: Payer: Self-pay | Admitting: Internal Medicine

## 2009-02-11 ENCOUNTER — Telehealth: Payer: Self-pay | Admitting: Internal Medicine

## 2009-02-27 ENCOUNTER — Ambulatory Visit: Payer: Self-pay | Admitting: Internal Medicine

## 2009-03-01 ENCOUNTER — Encounter: Payer: Self-pay | Admitting: Internal Medicine

## 2009-03-22 ENCOUNTER — Telehealth (INDEPENDENT_AMBULATORY_CARE_PROVIDER_SITE_OTHER): Payer: Self-pay | Admitting: *Deleted

## 2009-04-15 ENCOUNTER — Encounter: Payer: Self-pay | Admitting: Internal Medicine

## 2009-04-24 ENCOUNTER — Telehealth: Payer: Self-pay | Admitting: Internal Medicine

## 2009-04-25 ENCOUNTER — Ambulatory Visit: Payer: Self-pay | Admitting: Internal Medicine

## 2009-04-26 LAB — CONVERTED CEMR LAB
AST: 23 units/L (ref 0–37)
Alkaline Phosphatase: 48 units/L (ref 39–117)
Basophils Absolute: 0 10*3/uL (ref 0.0–0.1)
Bilirubin, Direct: 0.1 mg/dL (ref 0.0–0.3)
GFR calc non Af Amer: 46.81 mL/min (ref >60)
HCT: 37.1 % (ref 36.0–46.0)
Hemoglobin: 12.4 g/dL (ref 12.0–15.0)
Lymphs Abs: 1.7 10*3/uL (ref 0.7–4.0)
MCHC: 33.3 g/dL (ref 30.0–36.0)
Monocytes Relative: 9.5 % (ref 3.0–12.0)
Neutro Abs: 1.4 10*3/uL (ref 1.4–7.7)
Potassium: 5 meq/L (ref 3.5–5.1)
RDW: 12 % (ref 11.5–14.6)
Sodium: 138 meq/L (ref 135–145)
TSH: 0.02 microintl units/mL — ABNORMAL LOW (ref 0.35–5.50)
Total Bilirubin: 0.9 mg/dL (ref 0.3–1.2)

## 2009-04-30 ENCOUNTER — Ambulatory Visit: Payer: Self-pay | Admitting: Internal Medicine

## 2009-05-06 ENCOUNTER — Ambulatory Visit: Payer: Self-pay | Admitting: Oncology

## 2009-05-08 ENCOUNTER — Telehealth: Payer: Self-pay | Admitting: Internal Medicine

## 2009-05-10 ENCOUNTER — Telehealth: Payer: Self-pay | Admitting: Internal Medicine

## 2009-05-15 ENCOUNTER — Telehealth: Payer: Self-pay | Admitting: Internal Medicine

## 2009-05-16 ENCOUNTER — Ambulatory Visit: Payer: Self-pay | Admitting: Internal Medicine

## 2009-05-16 DIAGNOSIS — L299 Pruritus, unspecified: Secondary | ICD-10-CM | POA: Insufficient documentation

## 2009-05-17 ENCOUNTER — Encounter: Payer: Self-pay | Admitting: Internal Medicine

## 2009-05-17 LAB — MORPHOLOGY: PLT EST: ADEQUATE

## 2009-05-17 LAB — CHCC SMEAR

## 2009-05-17 LAB — COMPREHENSIVE METABOLIC PANEL
ALT: 11 U/L (ref 0–35)
AST: 14 U/L (ref 0–37)
Albumin: 4 g/dL (ref 3.5–5.2)
Alkaline Phosphatase: 50 U/L (ref 39–117)
Glucose, Bld: 101 mg/dL — ABNORMAL HIGH (ref 70–99)
Potassium: 4.7 mEq/L (ref 3.5–5.3)
Sodium: 140 mEq/L (ref 135–145)
Total Bilirubin: 0.3 mg/dL (ref 0.3–1.2)
Total Protein: 6.4 g/dL (ref 6.0–8.3)

## 2009-05-17 LAB — CBC WITH DIFFERENTIAL/PLATELET
BASO%: 1.3 % (ref 0.0–2.0)
Basophils Absolute: 0.1 10*3/uL (ref 0.0–0.1)
HCT: 39.7 % (ref 34.8–46.6)
HGB: 13 g/dL (ref 11.6–15.9)
LYMPH%: 55.7 % — ABNORMAL HIGH (ref 14.0–49.7)
MCHC: 32.7 g/dL (ref 31.5–36.0)
MONO#: 0.3 10*3/uL (ref 0.1–0.9)
NEUT%: 32.4 % — ABNORMAL LOW (ref 38.4–76.8)
Platelets: 156 10*3/uL (ref 145–400)
WBC: 4 10*3/uL (ref 3.9–10.3)
lymph#: 2.2 10*3/uL (ref 0.9–3.3)

## 2009-05-17 LAB — CONVERTED CEMR LAB
Bilirubin Urine: NEGATIVE
Ketones, ur: NEGATIVE mg/dL
pH: 7 (ref 5.0–8.0)

## 2009-06-03 ENCOUNTER — Telehealth: Payer: Self-pay | Admitting: Internal Medicine

## 2009-06-04 ENCOUNTER — Ambulatory Visit: Payer: Self-pay | Admitting: Internal Medicine

## 2009-06-18 ENCOUNTER — Telehealth: Payer: Self-pay | Admitting: Internal Medicine

## 2009-07-04 ENCOUNTER — Ambulatory Visit: Payer: Self-pay | Admitting: Internal Medicine

## 2009-07-04 DIAGNOSIS — B370 Candidal stomatitis: Secondary | ICD-10-CM | POA: Insufficient documentation

## 2009-07-26 ENCOUNTER — Telehealth: Payer: Self-pay | Admitting: Internal Medicine

## 2009-07-29 ENCOUNTER — Ambulatory Visit: Payer: Self-pay | Admitting: Internal Medicine

## 2009-07-29 LAB — CONVERTED CEMR LAB
GFR calc non Af Amer: 39.16 mL/min (ref >60)
Potassium: 5.3 meq/L — ABNORMAL HIGH (ref 3.5–5.1)
Sodium: 139 meq/L (ref 135–145)
TSH: 0.42 microintl units/mL (ref 0.35–5.50)

## 2009-07-30 ENCOUNTER — Encounter: Admission: RE | Admit: 2009-07-30 | Discharge: 2009-07-30 | Payer: Self-pay | Admitting: Family Medicine

## 2009-07-31 ENCOUNTER — Telehealth: Payer: Self-pay | Admitting: Internal Medicine

## 2009-09-03 ENCOUNTER — Encounter: Payer: Self-pay | Admitting: Internal Medicine

## 2009-09-09 ENCOUNTER — Ambulatory Visit: Payer: Self-pay | Admitting: Oncology

## 2009-09-09 ENCOUNTER — Ambulatory Visit: Payer: Self-pay | Admitting: Internal Medicine

## 2009-09-10 LAB — CHCC SMEAR

## 2009-09-10 LAB — COMPREHENSIVE METABOLIC PANEL
ALT: 9 U/L (ref 0–35)
AST: 16 U/L (ref 0–37)
Albumin: 4.1 g/dL (ref 3.5–5.2)
Calcium: 9 mg/dL (ref 8.4–10.5)
Chloride: 107 mEq/L (ref 96–112)
Potassium: 4.2 mEq/L (ref 3.5–5.3)
Sodium: 141 mEq/L (ref 135–145)
Total Protein: 6.7 g/dL (ref 6.0–8.3)

## 2009-09-10 LAB — CBC & DIFF AND RETIC
Basophils Absolute: 0 10*3/uL (ref 0.0–0.1)
Eosinophils Absolute: 0.2 10*3/uL (ref 0.0–0.5)
HCT: 39 % (ref 34.8–46.6)
HGB: 13.2 g/dL (ref 11.6–15.9)
Immature Retic Fract: 1.6 % (ref 0.00–10.70)
NEUT#: 1.6 10*3/uL (ref 1.5–6.5)
RDW: 14 % (ref 11.2–14.5)
Retic Ct Abs: 26.33 10*3/uL (ref 18.30–72.70)
lymph#: 1.4 10*3/uL (ref 0.9–3.3)

## 2009-09-11 ENCOUNTER — Encounter: Admission: RE | Admit: 2009-09-11 | Discharge: 2009-09-11 | Payer: Self-pay | Admitting: Obstetrics and Gynecology

## 2009-09-18 ENCOUNTER — Encounter: Payer: Self-pay | Admitting: Internal Medicine

## 2009-09-23 ENCOUNTER — Telehealth: Payer: Self-pay | Admitting: Internal Medicine

## 2009-09-27 ENCOUNTER — Encounter: Payer: Self-pay | Admitting: Internal Medicine

## 2009-10-14 ENCOUNTER — Encounter: Payer: Self-pay | Admitting: Internal Medicine

## 2009-11-05 ENCOUNTER — Telehealth: Payer: Self-pay | Admitting: Internal Medicine

## 2009-11-27 ENCOUNTER — Ambulatory Visit: Payer: Self-pay | Admitting: Internal Medicine

## 2009-11-28 LAB — CONVERTED CEMR LAB
AST: 18 units/L (ref 0–37)
Alkaline Phosphatase: 57 units/L (ref 39–117)
BUN: 24 mg/dL — ABNORMAL HIGH (ref 6–23)
Basophils Relative: 1.3 % (ref 0.0–3.0)
Calcium: 8.8 mg/dL (ref 8.4–10.5)
GFR calc non Af Amer: 65.14 mL/min (ref >60)
Hemoglobin: 13.6 g/dL (ref 12.0–15.0)
Lymphocytes Relative: 35.1 % (ref 12.0–46.0)
Monocytes Relative: 10.9 % (ref 3.0–12.0)
Neutro Abs: 1.9 10*3/uL (ref 1.4–7.7)
Potassium: 4.9 meq/L (ref 3.5–5.1)
RBC: 4.1 M/uL (ref 3.87–5.11)
Saturation Ratios: 39.3 % (ref 20.0–50.0)
Sodium: 136 meq/L (ref 135–145)
TSH: 0.11 microintl units/mL — ABNORMAL LOW (ref 0.35–5.50)
Total Bilirubin: 1.1 mg/dL (ref 0.3–1.2)
Transferrin: 249.2 mg/dL (ref 212.0–360.0)
WBC: 3.9 10*3/uL — ABNORMAL LOW (ref 4.5–10.5)

## 2009-12-05 ENCOUNTER — Encounter: Payer: Self-pay | Admitting: Internal Medicine

## 2010-01-21 ENCOUNTER — Telehealth: Payer: Self-pay | Admitting: Internal Medicine

## 2010-01-21 ENCOUNTER — Encounter (INDEPENDENT_AMBULATORY_CARE_PROVIDER_SITE_OTHER): Payer: Self-pay | Admitting: *Deleted

## 2010-01-22 ENCOUNTER — Ambulatory Visit: Payer: Self-pay | Admitting: Internal Medicine

## 2010-01-29 ENCOUNTER — Ambulatory Visit: Payer: Self-pay | Admitting: Internal Medicine

## 2010-01-29 LAB — HM COLONOSCOPY

## 2010-02-03 ENCOUNTER — Encounter: Payer: Self-pay | Admitting: Internal Medicine

## 2010-02-03 ENCOUNTER — Ambulatory Visit: Payer: Self-pay | Admitting: Internal Medicine

## 2010-02-03 DIAGNOSIS — G2581 Restless legs syndrome: Secondary | ICD-10-CM | POA: Insufficient documentation

## 2010-03-19 ENCOUNTER — Encounter: Payer: Self-pay | Admitting: Internal Medicine

## 2010-03-19 ENCOUNTER — Ambulatory Visit: Payer: Self-pay | Admitting: Oncology

## 2010-03-20 LAB — CBC WITH DIFFERENTIAL/PLATELET
Eosinophils Absolute: 0.1 10*3/uL (ref 0.0–0.5)
LYMPH%: 40.7 % (ref 14.0–49.7)
MCHC: 34.3 g/dL (ref 31.5–36.0)
MCV: 95.8 fL (ref 79.5–101.0)
MONO%: 10.2 % (ref 0.0–14.0)
NEUT#: 1.8 10*3/uL (ref 1.5–6.5)
Platelets: 161 10*3/uL (ref 145–400)
RBC: 4.14 10*6/uL (ref 3.70–5.45)

## 2010-03-20 LAB — COMPREHENSIVE METABOLIC PANEL
Alkaline Phosphatase: 50 U/L (ref 39–117)
BUN: 19 mg/dL (ref 6–23)
Glucose, Bld: 93 mg/dL (ref 70–99)
Sodium: 141 mEq/L (ref 135–145)
Total Bilirubin: 0.6 mg/dL (ref 0.3–1.2)
Total Protein: 6.9 g/dL (ref 6.0–8.3)

## 2010-03-20 LAB — HEPARIN ANTI-XA: Heparin LMW: 1.04 IU/mL

## 2010-03-24 ENCOUNTER — Encounter: Payer: Self-pay | Admitting: Internal Medicine

## 2010-04-21 ENCOUNTER — Ambulatory Visit: Payer: Self-pay | Admitting: Internal Medicine

## 2010-05-07 ENCOUNTER — Encounter: Payer: Self-pay | Admitting: Internal Medicine

## 2010-05-24 ENCOUNTER — Encounter: Payer: Self-pay | Admitting: Otolaryngology

## 2010-05-24 ENCOUNTER — Encounter: Payer: Self-pay | Admitting: Oncology

## 2010-05-25 ENCOUNTER — Encounter: Payer: Self-pay | Admitting: Oncology

## 2010-05-25 ENCOUNTER — Encounter: Payer: Self-pay | Admitting: *Deleted

## 2010-05-25 ENCOUNTER — Encounter: Payer: Self-pay | Admitting: Neurology

## 2010-05-25 ENCOUNTER — Encounter: Payer: Self-pay | Admitting: Internal Medicine

## 2010-06-03 NOTE — Progress Notes (Signed)
Summary: triage   Phone Note Call from Patient Call back at Home Phone [phone]   Caller: Patient Call For: Dr Juanda Chance Reason for Call: Talk to Nurse Summary of Call: Patient states that her stomach is like a "ball" really swollen and is bloated. Pt would like to be seen sooner than first available appt 10-20. Initial call taken by: Tawni Levy,  January 21, 2010 9:58 AM  Follow-up for Phone Call        patient scheduled to see Dr Juanda Chance 01/22/10 11:30 Follow-up by: Darcey Nora RN, CGRN,  January 21, 2010 10:08 AM

## 2010-06-03 NOTE — Letter (Signed)
Summary: [facility] [place]   Imported By: Sherian Rein 11/06/2009 15:14:44  _____________________________________________________________________  External Attachment:    Type:   Image     Comment:   External Document

## 2010-06-03 NOTE — Procedures (Signed)
Summary: Upper Endoscopy  Patient: Diane Abbott Note: All result statuses are Final unless otherwise noted.  Tests: (1) Upper Endoscopy (EGD)   EGD Upper Endoscopy       DONE (C)     [facility]     [address]     [place], [place]  [address]           ENDOSCOPY PROCEDURE REPORT           PATIENT:  [name], [name]  MR#:  [id number]     BIRTHDATE:  [birth year], 75 yrs. old  GENDER:  female           ENDOSCOPIST:  Hedwig Morton. Juanda Chance, MD     Referred by:  Linda Hedges Plotnikov, M.D.           PROCEDURE DATE:  01/29/2010     PROCEDURE:  EGD with biopsy     ASA CLASS:  Class II     INDICATIONS:  dyspepsia, bloating     EGD 1914,7829           MEDICATIONS:   Fentanyl 50 mcg, Versed 4 mg     TOPICAL ANESTHETIC:  Exactacain Spray           DESCRIPTION OF PROCEDURE:   After the risks benefits and     alternatives of the procedure were thoroughly explained, informed     consent was obtained.  The LB GIF-H180 [id number] endoscope was     introduced through the mouth and advanced to the second portion of     the duodenum, without limitations.  The instrument was slowly     withdrawn as the mucosa was fully examined.     <<PROCEDUREIMAGES>>           The upper, middle, and distal third of the esophagus were     carefully inspected and no abnormalities were noted. The z-line     was well seen at the GEJ. The endoscope was pushed into the fundus     which was normal including a retroflexed view. The antrum,gastric     body, first and second part of the duodenum were unremarkable.     With standard forceps, a biopsy was obtained and sent to pathology     (see image1, image2, image3, image4, image5, image6, and image7).     gastric biopsies    Retroflexed views revealed no abnormalities.     The scope was then withdrawn from the patient and the procedure     completed.           COMPLICATIONS:  None           ENDOSCOPIC IMPRESSION:     1) Normal EGD     s/p gastric biopsies  RECOMMENDATIONS:     1) Await biopsy results           REPEAT EXAM:  In 0 year(s) for.           ______________________________     Hedwig Morton. Juanda Chance, MD           CC:           n.     REVISED:  02/04/2010 01:29 PM     eSIGNED:   Hedwig Morton. Kolton Kienle at 02/04/2010 01:29 PM           Alyce Pagan, [id number]  Note: An exclamation mark (!) indicates a result that was not dispersed into the flowsheet. Document Creation Date: 02/04/2010 1:30 PM _______________________________________________________________________  (  1) Order result status: Final Collection or observation date-time: 01/29/2010 16:03 Requested date-time:  Receipt date-time:  Reported date-time:  Referring Physician:   Ordering Physician: [name] [phone]) Specimen Source:  Source: Launa Grill Order Number: [id number] Lab site:

## 2010-06-03 NOTE — Assessment & Plan Note (Signed)
Summary: 2 MO ROV /NWS  #   Vital Signs:  Patient profile:   76 year old female Height:      62 inches Weight:      156 pounds BMI:     28.64 O2 Sat:      94 % on Room air Temp:     98.4 degrees F oral Pulse rate:   76 / minute Pulse rhythm:   regular Resp:     16 per minute BP sitting:   118 / 70  (left arm) Cuff size:   regular  Vitals Entered By: Lanier Prude, CMA(AAMA) (November 27, 2009 9:46 AM)  O2 Flow:  Room air CC: 2 mo f/u Is Patient Diabetic? No Comments pt is not using Vagifem or Clotrimazole Vaginal cream   Primary Care Provider:  Sonda Primes, MD  CC:  2 mo f/u.  History of Present Illness: The patient presents for a follow up of back pain, anxiety, depression and headaches. Judith Part told her that she has black mold in her body and treated her for it. C/o restless legs...   Current Medications (verified): 1)  Oxycontin 10 Mg  Tb12 (Oxycodone Hcl) .Marland Kitchen.. 1 By Mouth  A Day For Pain. Please,  Fill On or After  11/03/09          . Ok To Fill 1 Day Earlier When The Month Has 31 Days in It. 2)  Protonix 40 Mg  Tbec (Pantoprazole Sodium) .... Take One Tab Once Daily 3)  Enbrel 50 Mg/ml  Soln (Etanercept) .Marland Kitchen.. 1 Inj Q 1 Wk Sq 4)  Vitamin D3 10000 Unit Caps (Cholecalciferol) .... Once Daily 5)  Levsin/sl 0.125 Mg Subl (Hyoscyamine Sulfate) .Marland Kitchen.. 1-2 Sl or By Mouth Qid Prn 6)  Lovenox 120 Mg/0.83ml Soln (Enoxaparin Sodium) .... Once Daily 7)  Citalopram Hydrobromide 20 Mg Tabs (Citalopram Hydrobromide) .... Take One Tablet By Mouth Daily 8)  Requip 1 Mg Tabs (Ropinirole Hcl) .... Take 1 By Mouth 3 Times A Day 9)  Phenazopyridine Hcl 100 Mg Tabs (Phenazopyridine Hcl) .... Take 1 Tablet By Mouth Three Times A Day As Needed 10)  Aleve 220 Mg Caps (Naproxen Sodium) .... Take 1 Twice Daily X 7 Days Then As Needed For Pain 11)  Vagifem 25 Mcg Tabs (Estradiol) .Marland Kitchen.. 1 Pv Q 2 D 12)  Atrovent 0.06 % Soln (Ipratropium Bromide) .Marland Kitchen.. 1-2 Sprays in Each Nostril As Needed For Runny  Nose 13)  Sm Clotrimazole Vaginal 1 % Crea (Clotrimazole) .... As Dirrected 14)  Hydroxyzine Hcl 25 Mg Tabs (Hydroxyzine Hcl) .Marland Kitchen.. 1-2 Tabs By Mouth Two Times A Day As Needed Itching 15)  Proair Hfa 108 (90 Base) Mcg/act Aers (Albuterol Sulfate) .... 2 Inh Qid As Needed 16)  Vit B12 Inject.  0.12ml .Marland Kitchen.. 1 Shot A Week  Allergies (verified): 1)  ! Xanax 2)  ! Neurontin 3)  ! Effexor 4)  ! Pcn 5)  ! Prednisone 6)  ! * Klonopin 7)  ! Vioxx 8)  ! Sulfa 9)  ! * Volaren 10)  ! * Benzodizzapines 11)  Mirapex (Pramipexole Dihydrochloride) 12)  Cymbalta (Duloxetine Hcl) 13)  Nortriptyline Hcl (Nortriptyline Hcl)  Past History:  Past Medical History: Last updated: 04/30/2009 FMS IMS Pulmonary embolism, hx of  2002 Rheumatoid arthritis    Dr Titus Dubin B12 def Vit D def Osteoarthritis Osteopenia H/o adrenal insuff IBS Tammy Worell NP writes her thyroid meds  for "energy"  Past Surgical History: Last updated: 10/05/2008 Hysterectomy Total knee replacement  B cholecystectomy in 2003 tonsillectomy in 1941 appendectomy in 1995 Right Wrist Sugery Bilateral oophorectomy with tummy tuck Cosmetic Surgery  Social History: Last updated: 10/05/2008 Retired Single Never Smoked Alcohol use-no Illicit Drug Use - no  Review of Systems       The patient complains of difficulty walking and depression.  The patient denies fever, hoarseness, prolonged cough, and abdominal pain.    Physical Exam  General:  NAD, overweight-appearing.   Eyes:  PERRLA, no icterus. Nose:  Erythematous throat mucosa and intranasal erythema.  Mouth:  Erythematous throat mucosa and intranasal erythema. No thrush Neck:  Supple; no masses or thyromegaly. Lungs:  Clear throughout to auscultation. Coughing hard Heart:  Regular rate and rhythm; no murmurs, rubs,  or bruits. Abdomen:  NT Msk:  Symmetrical with no gross deformities. Normal posture. Lumbar-sacral spine is tender to palpation over paraspinal  muscles and painfull with the ROM  Extremities:  No clubbing, cyanosis, edema or deformities noted. Neurologic:  Alert and  oriented x4;  grossly normal neurologically. Skin:  Hemorrhagic excoriations present on upper and lower extr B cheeks w/erythema Psych:  Oriented X3.  Less sad. Not suicidal and depressed affect.     Impression & Recommendations:  Problem # 1:  FIBROMYALGIA (ICD-729.1) Assessment Unchanged  Her updated medication list for this problem includes:    Oxycontin 10 Mg Tb12 (Oxycodone hcl) .Marland Kitchen... 1 by mouth  a day for pain. please,  fill on or after  01/04/10          . ok to fill 1 day earlier when the month has 31 days in it.    Aleve 220 Mg Caps (Naproxen sodium) .Marland Kitchen... Take 1 twice daily x 7 days then as needed for pain  Orders: TLB-CBC Platelet - w/Differential (85025-CBCD) TLB-IBC Pnl (Iron/FE;Transferrin) (83550-IBC) TLB-TSH (Thyroid Stimulating Hormone) (84443-TSH) TLB-B12, Serum-Total ONLY ([address]-U04) TLB-BMP (Basic Metabolic Panel-BMET) (80048-METABOL) TLB-Hepatic/Liver Function Pnl (80076-HEPATIC)  Problem # 2:  EDEMA (ICD-782.3) Assessment: Improved  Problem # 3:  PULMONARY EMBOLISM (ICD-415.19) Assessment: Comment Only  Her updated medication list for this problem includes:    Lovenox 120 Mg/0.54ml Soln (Enoxaparin sodium) ..... Once daily  Problem # 4:  DEPRESSION (ICD-311) Assessment: Unchanged  Her updated medication list for this problem includes:    Citalopram Hydrobromide 20 Mg Tabs (Citalopram hydrobromide) .Marland Kitchen... Take one tablet by mouth daily    Hydroxyzine Hcl 25 Mg Tabs (Hydroxyzine hcl) .Marland Kitchen... 1-2 tabs by mouth two times a day as needed itching  Problem # 5:  B12 DEFICIENCY (ICD-266.2) Assessment: Unchanged  Orders: TLB-CBC Platelet - w/Differential (85025-CBCD) TLB-IBC Pnl (Iron/FE;Transferrin) (83550-IBC) TLB-TSH (Thyroid Stimulating Hormone) (84443-TSH) TLB-B12, Serum-Total ONLY (54098-J19) TLB-BMP (Basic Metabolic Panel-BMET)  (80048-METABOL) TLB-Hepatic/Liver Function Pnl (80076-HEPATIC)  Complete Medication List: 1)  Oxycontin 10 Mg Tb12 (Oxycodone hcl) .Marland Kitchen.. 1 by mouth  a day for pain. please,  fill on or after  01/04/10          . ok to fill 1 day earlier when the month has 31 days in it. 2)  Protonix 40 Mg Tbec (Pantoprazole sodium) .... Take one tab once daily 3)  Enbrel 50 Mg/ml Soln (Etanercept) .Marland Kitchen.. 1 inj q 1 wk sq 4)  Vitamin D3 10000 Unit Caps (Cholecalciferol) .... Once daily 5)  Levsin/sl 0.125 Mg Subl (Hyoscyamine sulfate) .Marland Kitchen.. 1-2 sl or by mouth qid prn 6)  Lovenox 120 Mg/0.64ml Soln (Enoxaparin sodium) .... Once daily 7)  Citalopram Hydrobromide 20 Mg Tabs (Citalopram hydrobromide) .... Take one tablet by mouth  daily 8)  Requip 1 Mg Tabs (Ropinirole hcl) .... Take 1 by mouth 3 times a day 9)  Phenazopyridine Hcl 100 Mg Tabs (Phenazopyridine hcl) .... Take 1 tablet by mouth three times a day as needed 10)  Aleve 220 Mg Caps (Naproxen sodium) .... Take 1 twice daily x 7 days then as needed for pain 11)  Vagifem 25 Mcg Tabs (Estradiol) .Marland Kitchen.. 1 pv q 2 d 12)  Atrovent 0.06 % Soln (Ipratropium bromide) .Marland Kitchen.. 1-2 sprays in each nostril as needed for runny nose 13)  Sm Clotrimazole Vaginal 1 % Crea (Clotrimazole) .... As dirrected 14)  Hydroxyzine Hcl 25 Mg Tabs (Hydroxyzine hcl) .Marland Kitchen.. 1-2 tabs by mouth two times a day as needed itching 15)  Proair Hfa 108 (90 Base) Mcg/act Aers (Albuterol sulfate) .... 2 inh qid as needed 16)  Vit B12 Inject. 0.90ml  .Marland Kitchen.. 1 shot a week 17)  Ferro-bob 325 (65 Fe) Mg Tabs (Ferrous sulfate) .Marland Kitchen.. 1 by mouth once daily  Patient Instructions: 1)  Please schedule a follow-up appointment in 2 months. Prescriptions: REQUIP 1 MG TABS (ROPINIROLE HCL) take 1 by mouth 3 times a day Brand medically necessary #90 x 12   Entered and Authorized by:   Tresa Garter MD   Signed by:   Tresa Garter MD on 11/27/2009   Method used:   Print then Give to Patient   RxID:    [id number] OXYCONTIN 10 MG  TB12 (OXYCODONE HCL) 1 by mouth  a day for pain. Please,  fill on or after  01/04/10          . OK to fill 1 day earlier when the month has 31 days in it.  #30 x 0   Entered and Authorized by:   Tresa Garter MD   Signed by:   Tresa Garter MD on 11/27/2009   Method used:   Print then Give to Patient   RxID:   [id number] OXYCONTIN 10 MG  TB12 (OXYCODONE HCL) 1 by mouth  a day for pain. Please,  fill on or after  12/04/09          . OK to fill 1 day earlier when the month has 31 days in it.  #30 x 0   Entered and Authorized by:   Tresa Garter MD   Signed by:   Tresa Garter MD on 11/27/2009   Method used:   Print then Give to Patient   RxID:   [id number] FERRO-BOB 325 (65 FE) MG TABS (FERROUS SULFATE) 1 by mouth once daily  #30 x 6   Entered and Authorized by:   Tresa Garter MD   Signed by:   Tresa Garter MD on 11/27/2009   Method used:   Print then Give to Patient   RxID:   [id number]

## 2010-06-03 NOTE — Letter (Signed)
[facility]   Imported By: Sherian Rein 11/11/2009 12:02:48  _____________________________________________________________________  External Attachment:    Type:   Image     Comment:   External Document

## 2010-06-03 NOTE — Progress Notes (Signed)
Summary: labs  Phone Note Call from Patient Call back at Home Phone [phone]   Summary of Call: Patient has appt with another MD tomorrow and would like copy of labs placed up front for pickup. Done, left message on machine notifying patient. Initial call taken by: Lucious Groves,  July 31, 2009 12:57 PM  Follow-up for Phone Call        OK - or pls fax it to that MD Follow-up by: Tresa Garter MD,  July 31, 2009 5:21 PM

## 2010-06-03 NOTE — Assessment & Plan Note (Signed)
Summary: 2 MO ROV /NWS  #   RS'D PER PT/NWS   Vital Signs:  Patient profile:   76 year old female Weight:      160 pounds Temp:     98.6 degrees F oral Pulse rate:   92 / minute BP sitting:   106 / 60  (left arm)  Vitals Entered By: Tora Perches (July 04, 2009 10:04 AM) CC: f/u Is Patient Diabetic? No   Primary Care Provider:  Sonda Primes, MD  CC:  f/u.  History of Present Illness: C/o thrush in esophagus - bad - on Nystatin. F/u FMS, GERD, CTD  Preventive Screening-Counseling & Management  Alcohol-Tobacco     Smoking Status: never  Current Medications (verified): 1)  Protonix 40 Mg  Tbec (Pantoprazole Sodium) .... Take One Tab Once Daily 2)  Enbrel 50 Mg/ml  Soln (Etanercept) .Marland Kitchen.. 1 Inj Q 1 Wk Sq 3)  Vitamin D3 10000 Unit Caps (Cholecalciferol) .... Once Daily 4)  Levsin/sl 0.125 Mg Subl (Hyoscyamine Sulfate) .Marland Kitchen.. 1-2 Sl or By Mouth Qid Prn 5)  Oxycontin 10 Mg  Tb12 (Oxycodone Hcl) .Marland Kitchen.. 1 By Mouth  A Day For Pain. Please,  Fill On or After  04/14/09          . Ok To Fill 1 Day Earlier When The Month Has 31 Days in It. 6)  Lovenox 120 Mg/0.24ml Soln (Enoxaparin Sodium) .... Once Daily 7)  Citalopram Hydrobromide 20 Mg Tabs (Citalopram Hydrobromide) .... Take One Tablet By Mouth Daily 8)  Requip 1 Mg Tabs (Ropinirole Hcl) .... Take 1 By Mouth 3 Times A Day 9)  Phenazopyridine Hcl 100 Mg Tabs (Phenazopyridine Hcl) .... Take 1 Tablet By Mouth Three Times A Day As Needed 10)  Aleve 220 Mg Caps (Naproxen Sodium) .... Take 1 Twice Daily X 7 Days Then As Needed For Pain 11)  Vagifem 25 Mcg Tabs (Estradiol) .Marland Kitchen.. 1 Pv Q 2 D 12)  Atrovent 0.06 % Soln (Ipratropium Bromide) .Marland Kitchen.. 1-2 Sprays in Each Nostril As Needed For Runny Nose 13)  Sm Clotrimazole Vaginal 1 % Crea (Clotrimazole) .... As Dirrected 14)  Hydroxyzine Hcl 25 Mg Tabs (Hydroxyzine Hcl) .Marland Kitchen.. 1-2 Tabs By Mouth Two Times A Day As Needed Itching 15)  Proair Hfa 108 (90 Base) Mcg/act Aers (Albuterol Sulfate) .... 2 Inh Qid  As Needed 16)  Vit B12 Inject.  0.9ml .Marland Kitchen.. 1 Shot A Week  Allergies: 1)  ! Xanax 2)  ! Neurontin 3)  ! Effexor 4)  ! Pcn 5)  ! Prednisone 6)  ! * Klonopin 7)  ! Vioxx 8)  ! Sulfa 9)  ! * Volaren 10)  ! * Benzodizzapines 11)  Mirapex (Pramipexole Dihydrochloride) 12)  Cymbalta (Duloxetine Hcl) 13)  Nortriptyline Hcl (Nortriptyline Hcl)  Physical Exam  General:  NAD, overweight-appearing.   Nose:  Erythematous throat mucosa and intranasal erythema.  Mouth:  Erythematous throat mucosa and intranasal erythema. No thrush Neck:  Supple; no masses or thyromegaly. Lungs:  Clear throughout to auscultation. Coughing hard Heart:  Regular rate and rhythm; no murmurs, rubs,  or bruits. Abdomen:  NT Msk:  Symmetrical with no gross deformities. Normal posture. Lumbar-sacral spine is tender to palpation over paraspinal muscles and painfull with the ROM  Neurologic:  Alert and  oriented x4;  grossly normal neurologically. Skin:  Hemorrhagic excoriations present on upper and lower extr B cheeks w/erythema Psych:  Oriented X3.  Less sad. Not suicidal and depressed affect.     Impression &  Recommendations:  Problem # 1:  THRUSH (ICD-112.0) Assessment New Rx given  Problem # 2:  COUGH (ICD-786.2) Assessment: Improved  Problem # 3:  FIBROMYALGIA (ICD-729.1) Assessment: Unchanged  Her updated medication list for this problem includes:    Oxycontin 10 Mg Tb12 (Oxycodone hcl) .Marland Kitchen... 1 by mouth  a day for pain. please,  fill on or after  08/04/08          . ok to fill 1 day earlier when the month has 31 days in it.    Aleve 220 Mg Caps (Naproxen sodium) .Marland Kitchen... Take 1 twice daily x 7 days then as needed for pain  Problem # 4:  B12 DEFICIENCY (ICD-266.2) Assessment: Improved  Orders: Vit B12 1000 mcg (J3420) Admin of Therapeutic Inj  intramuscular or subcutaneous (29562)  Problem # 5:  CONNECTIVE TISSUE DISEASE (ICD-710.9) Assessment: Unchanged On prescription therapy   Complete  Medication List: 1)  Protonix 40 Mg Tbec (Pantoprazole sodium) .... Take one tab once daily 2)  Enbrel 50 Mg/ml Soln (Etanercept) .Marland Kitchen.. 1 inj q 1 wk sq 3)  Vitamin D3 10000 Unit Caps (Cholecalciferol) .... Once daily 4)  Levsin/sl 0.125 Mg Subl (Hyoscyamine sulfate) .Marland Kitchen.. 1-2 sl or by mouth qid prn 5)  Oxycontin 10 Mg Tb12 (Oxycodone hcl) .Marland Kitchen.. 1 by mouth  a day for pain. please,  fill on or after  08/04/08          . ok to fill 1 day earlier when the month has 31 days in it. 6)  Lovenox 120 Mg/0.103ml Soln (Enoxaparin sodium) .... Once daily 7)  Citalopram Hydrobromide 20 Mg Tabs (Citalopram hydrobromide) .... Take one tablet by mouth daily 8)  Requip 1 Mg Tabs (Ropinirole hcl) .... Take 1 by mouth 3 times a day 9)  Phenazopyridine Hcl 100 Mg Tabs (Phenazopyridine hcl) .... Take 1 tablet by mouth three times a day as needed 10)  Aleve 220 Mg Caps (Naproxen sodium) .... Take 1 twice daily x 7 days then as needed for pain 11)  Vagifem 25 Mcg Tabs (Estradiol) .Marland Kitchen.. 1 pv q 2 d 12)  Atrovent 0.06 % Soln (Ipratropium bromide) .Marland Kitchen.. 1-2 sprays in each nostril as needed for runny nose 13)  Sm Clotrimazole Vaginal 1 % Crea (Clotrimazole) .... As dirrected 14)  Hydroxyzine Hcl 25 Mg Tabs (Hydroxyzine hcl) .Marland Kitchen.. 1-2 tabs by mouth two times a day as needed itching 15)  Proair Hfa 108 (90 Base) Mcg/act Aers (Albuterol sulfate) .... 2 inh qid as needed 16)  Vit B12 Inject. 0.48ml  .Marland Kitchen.. 1 shot a week 17)  Diflucan 100 Mg Tabs (Fluconazole) .... Take two tablets on the first day, than  1 by mouth once daily untill gone for a fungul infection  Patient Instructions: 1)  Please schedule a follow-up appointment in 2 months. 2)  Call if you are not better in a reasonable amount of time or if worse.  Prescriptions: OXYCONTIN 10 MG  TB12 (OXYCODONE HCL) 1 by mouth  a day for pain. Please,  fill on or after  08/04/08          . OK to fill 1 day earlier when the month has 31 days in it.  #30 x 0   Entered and Authorized by:    Tresa Garter MD   Signed by:   Tresa Garter MD on 07/04/2009   Method used:   Print then Give to Patient   RxID:   [id number] OXYCONTIN 10 MG  TB12 (OXYCODONE  HCL) 1 by mouth  a day for pain. Please,  fill on or after  07/04/08          . OK to fill 1 day earlier when the month has 31 days in it.  #30 x 0   Entered and Authorized by:   Tresa Garter MD   Signed by:   Tresa Garter MD on 07/04/2009   Method used:   Print then Give to Patient   RxID:   [id number] DIFLUCAN 100 MG TABS (FLUCONAZOLE) Take two tablets on the first day, than  1 by mouth once daily untill gone for a fungul infection  #11 x 1   Entered and Authorized by:   Tresa Garter MD   Signed by:   Tresa Garter MD on 07/04/2009   Method used:   Print then Give to Patient   RxID:   [id number]    Medication Administration  Injection # 1:    Medication: Vit B12 1000 mcg    Diagnosis: B12 DEFICIENCY (ICD-266.2)    Route: IM    Site: L deltoid    Exp Date: 03/2011    Lot #: [id number]    Mfr: [employer]    Comments:    Patient tolerated injection without complications    Given by: Tora Perches (July 04, 2009 10:50 AM)  Orders Added: 1)  Vit B12 1000 mcg [J3420] 2)  Admin of Therapeutic Inj  intramuscular or subcutaneous [96372] 3)  Est. Patient Level IV [42706]

## 2010-06-03 NOTE — Progress Notes (Signed)
Summary: ?  Phone Note Call from Patient   Summary of Call: Patient is requesting a call regarding rx.  Initial call taken by: Lamar Sprinkles, CMA,  May 15, 2009 1:27 PM  Follow-up for Phone Call        left mess to call office back  Follow-up by: Lamar Sprinkles, CMA,  May 16, 2009 8:54 AM  Additional Follow-up for Phone Call Additional follow up Details #1::        Pt c/o continued symptoms, scheduled for office visit today Additional Follow-up by: Lamar Sprinkles, CMA,  May 16, 2009 1:30 PM

## 2010-06-03 NOTE — Letter (Signed)
[facility]   Imported By: Sherian Rein 01/01/2010 10:12:56  _____________________________________________________________________  External Attachment:    Type:   Image     Comment:   External Document

## 2010-06-03 NOTE — Miscellaneous (Signed)
Summary: new med  Clinical Lists Changes  Medications: Added new medication of MIRALAX   POWD (POLYETHYLENE GLYCOL 3350) 1 capful per day - Signed Rx of MIRALAX   POWD (POLYETHYLENE GLYCOL 3350) 1 capful per day;  #522 gm x 3;  Signed;  Entered by: Oda Cogan RN;  Authorized by: Hart Carwin MD;  Method used: Electronically to [facility]  [place] [address] [address], [place], [place]  [address], Ph: [phone] or [phone], Fax: [phone]    Prescriptions: MIRALAX   POWD (POLYETHYLENE GLYCOL 3350) 1 capful per day  #522 gm x 3   Entered by:   Oda Cogan RN   Authorized by:   Hart Carwin MD   Signed by:   Oda Cogan RN on 01/29/2010   Method used:   [facility]ronically to        [facility]  [place] [address] (retail)       [address]       [place], [place]  [address]       Ph: [phone] or [phone]       Fax: [phone]   RxID:   [id number]

## 2010-06-03 NOTE — Procedures (Signed)
Summary: Colonoscopy  Patient: [name] Note: All result statuses are Final unless otherwise noted.  Tests: (1) Colonoscopy (COL)   COL Colonoscopy           DONE (C)     [facility]     [address]     [place], [place]  [address]           COLONOSCOPY PROCEDURE REPORT           PATIENT:  Diane Abbott, Diane Abbott  MR#:  [id number]     BIRTHDATE:  10-16-1934, 75 yrs. old  GENDER:  female     ENDOSCOPIST:  Hedwig Morton. Juanda Chance, MD     REF. BY:  Linda Hedges. Plotnikov, M.D.     PROCEDURE DATE:  01/29/2010     PROCEDURE:  Colonoscopy 19[address]     ASA CLASS:  Class II     INDICATIONS:  Abdominal pain, Routine Risk Screening last colon     2001, IBS     MEDICATIONS:   Fentanyl 50 mcg, Versed 6 mg IV           DESCRIPTION OF PROCEDURE:   After the risks benefits and     alternatives of the procedure were thoroughly explained, informed     consent was obtained.  Digital rectal exam was performed and     revealed no rectal masses.   The LB PCF-H180AL [id number] endoscope     was introduced through the anus and advanced to the cecum, which     was identified by both the appendix and ileocecal valve, without     limitations.  The quality of the prep was good, using MiraLax.     The instrument was then slowly withdrawn as the colon was fully     examined.     <<PROCEDUREIMAGES>>           FINDINGS:  No polyps or cancers were seen (see image1, image2,     image3, image4, image5, image6, and image7).   Retroflexed views     in the rectum revealed no abnormalities.    The scope was then     withdrawn from the patient and the procedure completed.           COMPLICATIONS:  None     ENDOSCOPIC IMPRESSION:     1) No polyps or cancers     RECOMMENDATIONS:     1) high fiber diet     Resume Lovenox today     Align 1x/day     Miralax 17 gm prn constipation     REPEAT EXAM:  In 10 year(s) for.           ______________________________     Hedwig Morton. Juanda Chance, MD           CC:           n.     REVISED:   02/04/2010 01:28 PM     eSIGNED:   Hedwig Morton. Brodie at 02/04/2010 01:28 PM           Diane Abbott, [id number]  Note: An exclamation mark (!) indicates a result that was not dispersed into the flowsheet. Document Creation Date: 02/04/2010 1:29 PM _______________________________________________________________________  (1) Order result status: Final Collection or observation date-time: 01/29/2010 16:27 Requested date-time:  Receipt date-time:  Reported date-time:  Referring Physician:   Ordering Physician: [name] [phone]) Specimen Source:  Source: Launa Grill Order Number: [id number] Lab site:

## 2010-06-03 NOTE — Assessment & Plan Note (Signed)
Summary: 2 MO ROV /NWS  #   Vital Signs:  Patient profile:   76 year old female Height:      62 inches Weight:      162 pounds BMI:     29.74 O2 Sat:      96 % on Room air Pulse rate:   84 / minute Pulse rhythm:   regular Resp:     98.1 per minute BP sitting:   120 / 68  (left arm) Cuff size:   large  Vitals Entered By: Rock Nephew CMA (February 03, 2010 8:24 AM)  O2 Flow:  Room air CC: follow-up visit Is Patient Diabetic? No Pain Assessment Patient in pain? no       Does patient need assistance? Functional Status Self care Ambulation Normal   Primary Care Provider:  Sonda Primes, MD  CC:  follow-up visit.  History of Present Illness: C/o abd swelling, bloating, wt gain in mid-section The patient presents for a follow up of chronic pain, anxiety, depression and headaches, IBS.    Current Medications (verified): 1)  Oxycontin 10 Mg  Tb12 (Oxycodone Hcl) .Marland Kitchen.. 1 By Mouth  A Day For Pain. Please,  Fill On or After  01/04/10          . Ok To Fill 1 Day Earlier When The Month Has 31 Days in It. 2)  Protonix 40 Mg  Tbec (Pantoprazole Sodium) .... Take One Tab Once Daily 3)  Enbrel 50 Mg/ml  Soln (Etanercept) .Marland Kitchen.. 1 Inj Q 1 Wk Sq 4)  Vitamin D3 10000 Unit Caps (Cholecalciferol) .... Once Daily 5)  Levsin/sl 0.125 Mg Subl (Hyoscyamine Sulfate) .Marland Kitchen.. 1-2 Sl or By Mouth Qid Prn 6)  Lovenox 120 Mg/0.76ml Soln (Enoxaparin Sodium) .Marland Kitchen.. 1 Injection Daily 7)  Requip 1 Mg Tabs (Ropinirole Hcl) .... Take 1 By Mouth 3 Times A Day 8)  Aleve 220 Mg Caps (Naproxen Sodium) .... Take 1 Twice Daily X 7 Days Then As Needed For Pain 9)  Vagifem 25 Mcg Tabs (Estradiol) .Marland Kitchen.. 1 Pv Q 2 D 10)  Atrovent 0.06 % Soln (Ipratropium Bromide) .Marland Kitchen.. 1-2 Sprays in Each Nostril As Needed For Runny Nose 11)  Sm Clotrimazole Vaginal 1 % Crea (Clotrimazole) .... As Dirrected 12)  Vit B12 Inject.  0.55ml .Marland Kitchen.. 1 Shot A Week 13)  Ferro-Bob 325 (65 Fe) Mg Tabs (Ferrous Sulfate) .Marland Kitchen.. 1 By Mouth Once Daily 14)   Miralax   Powd (Polyethylene Glycol 3350) .... As Per Prep  Instructions. 15)  Reglan 10 Mg  Tabs (Metoclopramide Hcl) .... As Per Prep Instructions. 16)  Dulcolax 5 Mg  Tbec (Bisacodyl) .... Day Before Procedure Take 2 At 3pm and 2 At 8pm. 17)  Miralax   Powd (Polyethylene Glycol 3350) .Marland Kitchen.. 1 Capful Per Day 18)  Align  Caps (Probiotic Product) .... Take 1 Tablet By Mouth Once A Day 19)  Nexium 40 Mg Cpdr (Esomeprazole Magnesium) .... Take 1 Tablet By Mouth Once A Day  Allergies (verified): 1)  ! Xanax 2)  ! Neurontin 3)  ! Effexor 4)  ! Pcn 5)  ! Prednisone 6)  ! * Klonopin 7)  ! Vioxx 8)  ! Sulfa 9)  ! * Volaren 10)  ! * Benzodizzapines 11)  Mirapex (Pramipexole Dihydrochloride) 12)  Cymbalta (Duloxetine Hcl) 13)  Nortriptyline Hcl (Nortriptyline Hcl)  Past History:  Past Medical History: Last updated: 01/22/2010 FMS IMS Pulmonary embolism, hx of  2002 Rheumatoid arthritis    Dr Titus Dubin B12 def  Vit D def Osteoarthritis Osteopenia H/o adrenal insuff IBS Tammy Worell NP writes her thyroid meds  for "energy" blood clot below the knee 2008  Past Surgical History: Last updated: 10/05/2008 Hysterectomy Total knee replacement B cholecystectomy in 2003 tonsillectomy in 1941 appendectomy in 1995 Right Wrist Sugery Bilateral oophorectomy with tummy tuck Cosmetic Surgery  Social History: Last updated: 10/05/2008 Retired Single Never Smoked Alcohol use-no Illicit Drug Use - no  Review of Systems       The patient complains of difficulty walking and depression.  The patient denies fever and abdominal pain.    Physical Exam  General:  NAD, overweight-appearing.   Nose:  Erythematous throat mucosa and intranasal erythema.  Mouth:  Erythematous throat mucosa and intranasal erythema. No thrush Lungs:  Clear throughout to auscultation. Coughing hard Heart:  Regular rate and rhythm; no murmurs, rubs,  or bruits. Abdomen:  NT Msk:  Symmetrical with no gross  deformities. Normal posture. Lumbar-sacral spine is tender to palpation over paraspinal muscles and painfull with the ROM  Neurologic:  Alert and  oriented x4;  grossly normal neurologically. Skin:  Hemorrhagic excoriations present on upper and lower extr B cheeks w/erythema Psych:  Oriented X3.  Less sad. Not suicidal and depressed affect.     Impression & Recommendations:  Problem # 1:  FIBROMYALGIA (ICD-729.1) Assessment Deteriorated  Her updated medication list for this problem includes:    Oxycontin 10 Mg Tb12 (Oxycodone hcl) .Marland Kitchen... 1 by mouth twice a day for pain. please,  fill on or after  03/06/10          . ok to fill 1 day earlier when the month has 31 days in it.    Aleve 220 Mg Caps (Naproxen sodium) .Marland Kitchen... Take 1 twice daily x 7 days then as needed for pain  Problem # 2:  PULMONARY EMBOLISM (ICD-415.19) Assessment: Unchanged  Her updated medication list for this problem includes:    Lovenox 120 Mg/0.81ml Soln (Enoxaparin sodium) .Marland Kitchen... 1 injection daily  Problem # 3:  B12 DEFICIENCY (ICD-266.2) Assessment: Unchanged On the regimen of medicine(s) reflected in the chart    Problem # 4:  RESTLESS LEG SYNDROME (ICD-333.94) Assessment: Comment Only On the regimen of medicine(s) reflected in the chart    Complete Medication List: 1)  Oxycontin 10 Mg Tb12 (Oxycodone hcl) .Marland Kitchen.. 1 by mouth twice a day for pain. please,  fill on or after  03/06/10          . ok to fill 1 day earlier when the month has 31 days in it. 2)  Protonix 40 Mg Tbec (Pantoprazole sodium) .... Take one tab once daily 3)  Enbrel 50 Mg/ml Soln (Etanercept) .Marland Kitchen.. 1 inj q 1 wk sq 4)  Vitamin D3 10000 Unit Caps (Cholecalciferol) .... Once daily 5)  Levsin/sl 0.125 Mg Subl (Hyoscyamine sulfate) .Marland Kitchen.. 1-2 sl or by mouth qid prn 6)  Lovenox 120 Mg/0.83ml Soln (Enoxaparin sodium) .Marland Kitchen.. 1 injection daily 7)  Requip 1 Mg Tabs (Ropinirole hcl) .... Take 1 by mouth 3 times a day 8)  Aleve 220 Mg Caps (Naproxen sodium) ....  Take 1 twice daily x 7 days then as needed for pain 9)  Vagifem 25 Mcg Tabs (Estradiol) .Marland Kitchen.. 1 pv q 2 d 10)  Atrovent 0.06 % Soln (Ipratropium bromide) .Marland Kitchen.. 1-2 sprays in each nostril as needed for runny nose 11)  Sm Clotrimazole Vaginal 1 % Crea (Clotrimazole) .... As dirrected 12)  Vit B12 Inject. 0.20ml  .Marland Kitchen.. 1 shot  a week 13)  Ferro-bob 325 (65 Fe) Mg Tabs (Ferrous sulfate) .Marland Kitchen.. 1 by mouth once daily 14)  Miralax Powd (Polyethylene glycol 3350) .... As per prep  instructions. 15)  Reglan 10 Mg Tabs (Metoclopramide hcl) .... As per prep instructions. 16)  Dulcolax 5 Mg Tbec (Bisacodyl) .... Day before procedure take 2 at 3pm and 2 at 8pm. 17)  Miralax Powd (Polyethylene glycol 3350) .Marland Kitchen.. 1 capful per day 18)  Align Caps (Probiotic product) .... Take 1 tablet by mouth once a day 19)  Nexium 40 Mg Cpdr (Esomeprazole magnesium) .... Take 1 tablet by mouth two times a day  Other Orders: Pneumococcal Vaccine (60[address]) Admin 1st Vaccine ([address])  Patient Instructions: 1)  Please schedule a follow-up appointment in 2 months. Prescriptions: NEXIUM 40 MG CPDR (ESOMEPRAZOLE MAGNESIUM) Take 1 tablet by mouth two times a day  #60 x 11   Entered and Authorized by:   Tresa Garter MD   Signed by:   Tresa Garter MD on 02/03/2010   Method used:   Print then Give to Patient   RxID:   [id number] ALIGN  CAPS (PROBIOTIC PRODUCT) Take 1 tablet by mouth once a day  #30 x 11   Entered and Authorized by:   Tresa Garter MD   Signed by:   Tresa Garter MD on 02/03/2010   Method used:   Print then Give to Patient   RxID:   [id number] OXYCONTIN 10 MG  TB12 (OXYCODONE HCL) 1 by mouth twice a day for pain. Please,  fill on or after  03/06/10          . OK to fill 1 day earlier when the month has 31 days in it.  #60 x 0   Entered and Authorized by:   Tresa Garter MD   Signed by:   Tresa Garter MD on 02/03/2010   Method used:   Print then Give to Patient   RxID:    [id number] OXYCONTIN 10 MG  TB12 (OXYCODONE HCL) 1 by mouth twice a day for pain. Please,  fill on or after  02/03/10          . OK to fill 1 day earlier when the month has 31 days in it.  #60 x 0   Entered and Authorized by:   Tresa Garter MD   Signed by:   Tresa Garter MD on 02/03/2010   Method used:   Print then Give to Patient   RxID:   [id number]    Immunizations Administered:  Pneumonia Vaccine:    Vaccine Type: Pneumovax    Site: right deltoid    Mfr: Merck    Dose: 0.5 ml    Route: IM    Given by: Ami Bullins CMA    Exp. Date: 05/21/2011    Lot #: [id number]    VIS given: 04/08/09 version given February 03, 2010.

## 2010-06-03 NOTE — Procedures (Signed)
Summary: Colonoscopy  Patient: [name] Note: All result statuses are Final unless otherwise noted.  Tests: (1) Colonoscopy (COL)   COL Colonoscopy           DONE     [facility]     [address]     [place], [place]  [address]           COLONOSCOPY PROCEDURE REPORT           PATIENT:  Diane Abbott, Diane Abbott  MR#:  [id number]     BIRTHDATE:  August 24, 1934, 75 yrs. old  GENDER:  female     ENDOSCOPIST:  Hedwig Morton. Juanda Chance, MD     REF. BY:  Linda Hedges. Plotnikov, M.D.     PROCEDURE DATE:  01/29/2010     PROCEDURE:  Colonoscopy 84[address]     ASA CLASS:  Class II     INDICATIONS:  Abdominal pain, Routine Risk Screening last colon     2001, IBS     MEDICATIONS:   Versed 4 mg, Fentanyl 50 mcg           DESCRIPTION OF PROCEDURE:   After the risks benefits and     alternatives of the procedure were thoroughly explained, informed     consent was obtained.  Digital rectal exam was performed and     revealed no rectal masses.   The LB PCF-H180AL [id number] endoscope     was introduced through the anus and advanced to the cecum, which     was identified by both the appendix and ileocecal valve, without     limitations.  The quality of the prep was good, using MiraLax.     The instrument was then slowly withdrawn as the colon was fully     examined.     <<PROCEDUREIMAGES>>           FINDINGS:  No polyps or cancers were seen (see image1, image2,     image3, image4, image5, image6, and image7).   Retroflexed views     in the rectum revealed no abnormalities.    The scope was then     withdrawn from the patient and the procedure completed.           COMPLICATIONS:  None     ENDOSCOPIC IMPRESSION:     1) No polyps or cancers     RECOMMENDATIONS:     1) high fiber diet     Resume Lovenox today     Align 1x/day     Miralax 17 gm prn constipation     REPEAT EXAM:  In 10 year(s) for.           ______________________________     Hedwig Morton. Juanda Chance, MD           CC:           n.     eSIGNED:   Hedwig Morton. Brodie at 01/29/2010 04:54 PM           Alyce Pagan, [id number]  Note: An exclamation mark (!) indicates a result that was not dispersed into the flowsheet. Document Creation Date: 01/29/2010 4:55 PM _______________________________________________________________________  (1) Order result status: Final Collection or observation date-time: 01/29/2010 16:27 Requested date-time:  Receipt date-time:  Reported date-time:  Referring Physician:   Ordering Physician: [name] [phone]) Specimen Source:  Source: Launa Grill Order Number: [id number] Lab site:   Appended Document: Colonoscopy    Clinical Lists Changes  Observations: Added new observation of COLONNXTDUE:  01/2020 (01/29/2010 17:20)     

## 2010-06-03 NOTE — Assessment & Plan Note (Signed)
Summary: abdominal pain and bloating/Diane Abbott    History of Present Illness Visit Type: Follow-up Visit Primary GI MD: [name] MD Primary Provider: Sonda Primes, MD Requesting Provider: n/a Chief Complaint: Abdominal discomfort with abdominal bloating History of Present Illness:   This is a 76 year old white female with irritable bowel syndrome who has been followed by me for many years. She also has fibromyalgia, rheumatoid arthritis, deep venous thrombosis (on Lovenox) and depression. She had a remote cholecystectomy in 2003. She comes today with a several week history of bloating, abdominal distention and feeling uncomfortable. She used to have diarrhea but now is constipated. Her last colonoscopy in 2001 was normal. An upper endoscopy at that time showed bile reflux gastritis. She is due for a repeat colonoscopy. An MRI of the abdomen in March of this year by a holistic physician was essentially normal.   GI Review of Systems    Reports abdominal pain and  bloating.      Denies acid reflux, belching, chest pain, dysphagia with liquids, dysphagia with solids, heartburn, loss of appetite, nausea, vomiting, vomiting blood, weight loss, and  weight gain.      Reports constipation and  irritable bowel syndrome.     Denies anal fissure, black tarry stools, change in bowel habit, diverticulosis, fecal incontinence, heme positive stool, hemorrhoids, jaundice, light color stool, liver problems, rectal bleeding, and  rectal pain.    Current Medications (verified): 1)  Oxycontin 10 Mg  Tb12 (Oxycodone Hcl) .Marland Kitchen.. 1 By Mouth  A Day For Pain. Please,  Fill On or After  01/04/10          . Ok To Fill 1 Day Earlier When The Month Has 31 Days in It. 2)  Protonix 40 Mg  Tbec (Pantoprazole Sodium) .... Take One Tab Once Daily 3)  Enbrel 50 Mg/ml  Soln (Etanercept) .Marland Kitchen.. 1 Inj Q 1 Wk Sq 4)  Vitamin D3 10000 Unit Caps (Cholecalciferol) .... Once Daily 5)  Levsin/sl 0.125 Mg Subl (Hyoscyamine Sulfate) .Marland Kitchen..  1-2 Sl or By Mouth Qid Prn 6)  Lovenox 120 Mg/0.25ml Soln (Enoxaparin Sodium) .Marland Kitchen.. 1 Injection Daily 7)  Requip 1 Mg Tabs (Ropinirole Hcl) .... Take 1 By Mouth 3 Times A Day 8)  Aleve 220 Mg Caps (Naproxen Sodium) .... Take 1 Twice Daily X 7 Days Then As Needed For Pain 9)  Vagifem 25 Mcg Tabs (Estradiol) .Marland Kitchen.. 1 Pv Q 2 D 10)  Atrovent 0.06 % Soln (Ipratropium Bromide) .Marland Kitchen.. 1-2 Sprays in Each Nostril As Needed For Runny Nose 11)  Sm Clotrimazole Vaginal 1 % Crea (Clotrimazole) .... As Dirrected 12)  Vit B12 Inject.  0.46ml .Marland Kitchen.. 1 Shot A Week 13)  Ferro-Bob 325 (65 Fe) Mg Tabs (Ferrous Sulfate) .Marland Kitchen.. 1 By Mouth Once Daily  Allergies (verified): 1)  ! Xanax 2)  ! Neurontin 3)  ! Effexor 4)  ! Pcn 5)  ! Prednisone 6)  ! * Klonopin 7)  ! Vioxx 8)  ! Sulfa 9)  ! * Volaren 10)  ! * Benzodizzapines 11)  Mirapex (Pramipexole Dihydrochloride) 12)  Cymbalta (Duloxetine Hcl) 13)  Nortriptyline Hcl (Nortriptyline Hcl)  Past History:  Family History: Last updated: 10/05/2008 Family History Ovarian cancer: Mother Family History of Thromboembolism clotting disorder Family History Breast cancer 1st degree relative <50 No FH of Colon Cancer: Family History of Heart Disease: Maternal Grandmother Family History of Diabetes: Mother Family History of Pancreatic Cancer:  Social History: Last updated: 10/05/2008 Retired Single Never Smoked Alcohol use-no Illicit Drug  Use - no  Past Medical History: FMS IMS Pulmonary embolism, hx of  2002 Rheumatoid arthritis    Dr Titus Dubin B12 def Vit D def Osteoarthritis Osteopenia H/o adrenal insuff IBS Tammy Worell NP writes her thyroid meds  for "energy" blood clot below the knee 2008  Past Surgical History: Reviewed history from 10/05/2008 and no changes required. Hysterectomy Total knee replacement B cholecystectomy in 2003 tonsillectomy in 1941 appendectomy in 1995 Right Wrist Sugery Bilateral oophorectomy with tummy tuck Cosmetic  Surgery  Review of Systems  The patient denies allergy/sinus, anemia, anxiety-new, arthritis/joint pain, back pain, blood in urine, breast changes/lumps, change in vision, confusion, cough, coughing up blood, depression-new, fainting, fatigue, fever, headaches-new, hearing problems, heart murmur, heart rhythm changes, itching, menstrual pain, muscle pains/cramps, night sweats, nosebleeds, pregnancy symptoms, shortness of breath, skin rash, sleeping problems, sore throat, swelling of feet/legs, swollen lymph glands, thirst - excessive , urination - excessive , urination changes/pain, urine leakage, vision changes, and voice change.         Pertinent positive and negative review of systems were noted in the above HPI. All other ROS was otherwise negative.   Vital Signs:  Patient profile:   76 year old female Height:      62 inches Weight:      162 pounds BMI:     29.74 BSA:     1.75 Pulse rate:   78 / minute Pulse rhythm:   regular BP sitting:   120 / 66  (left arm)  Vitals Entered By: Merri Ray CMA Duncan Dull) (January 22, 2010 11:30 AM)  Physical Exam  General:  Well developed, well nourished, no acute distress. Mouth:  No deformity or lesions, dentition normal. Neck:  Supple; no masses or thyromegaly. Lungs:  Clear throughout to auscultation. Heart:  Regular rate and rhythm; no murmurs, rubs,  or bruits. Abdomen:  emosis lower abdomen from Lovenox, normoactive bowel sounds, status post abdominoplasty. Soft abdomen but diffusely tender in all quadrants. No ascites, liver edge at costal margin. No palpable mass. Rectal:  soft Hemoccult negative stool. Extremities:  No clubbing, cyanosis, edema or deformities noted. Skin:  Intact without significant lesions or rashes. Psych:  Alert and cooperative. Normal mood and affect.   Impression & Recommendations:  Problem # 1:  IRRITABLE BOWEL SYNDROME (ICD-564.1) Patient has irritable bowel syndrome with predominant diarrhea in the past  now with constipation. Her abdominal symptoms may be related to decreased bowel motility. She is due for a colonoscopy. Her last exam was 10 years ago. We will go and schedule her for a colon exam. At the same time I gave her samples of Align and Bentyl 10 mg twice a day p.r.n.  Problem # 2:  ABDOMINAL PAIN-EPIGASTRIC (ICD-789.06) Patient has a history of bile reflux gastritis and is status post remote cholecystectomy. I have given her samples of Nexium 40 mg twice a day and have scheduled her for an upper endoscopy which would be done the same day as her colonoscopy. Orders: Colon/Endo (Colon/Endo)  Patient Instructions: 1)  You have been scheduled for an endoscopy/colonoscopy on 01/29/10. You have been given written prep instructions.  2)  Please discontinue your Lovenox injection 24 hours prior to your procedure. You will be given instructions on when to start back after your procedure has been completed. 3)  Please go by the pharmacy and pick up reglan, miralax and dulcolax. We have sent an electronic prescription to your pharmacy for each of these medications. 4)  You have been given  samples of Align to take 1 capsule by mouth once daily. 5)  Copy sent to : Dr Trinna Post Plotnikov 6)  The medication list was reviewed and reconciled.  All changed / newly prescribed medications were explained.  A complete medication list was provided to the patient / caregiver. Prescriptions: DULCOLAX 5 MG  TBEC (BISACODYL) Day before procedure take 2 at 3pm and 2 at 8pm.  #4 x 0   Entered by:   Lamona Curl CMA (AAMA)   Authorized by:   Hart Carwin MD   Signed by:   Lamona Curl CMA (AAMA) on 01/22/2010   Method used:   Electronically to        [facility]  [place] [address] (retail)       [address]       [place], [place]  [address]       Ph: [phone] or [phone]       Fax: [phone]   RxID:   [id number] REGLAN 10 MG  TABS (METOCLOPRAMIDE HCL) As per prep instructions.  #2 x 0    Entered by:   Lamona Curl CMA (AAMA)   Authorized by:   Hart Carwin MD   Signed by:   Lamona Curl CMA (AAMA) on 01/22/2010   Method used:   Electronically to        [facility]  [place] [address] (retail)       [address]       [place], [place]  [address]       Ph: [phone] or [phone]       Fax: [phone]   RxID:   [id number] MIRALAX   POWD (POLYETHYLENE GLYCOL 3350) As per prep  instructions.  #255gm x 0   Entered by:   Lamona Curl CMA (AAMA)   Authorized by:   Hart Carwin MD   Signed by:   Lamona Curl CMA (AAMA) on 01/22/2010   Method used:   [facility]        [facility]  [place] [address] (retail[facility]       [address]       [place], [place]  [address]       Ph: [phone] or [phone]       Fax: [phone]   RxID:   [id number]

## 2010-06-03 NOTE — Assessment & Plan Note (Signed)
Summary: chest congestion/cough / SD   Vital Signs:  Patient profile:   76 year old female Weight:      160 pounds Temp:     98 degrees F oral Pulse rate:   86 / minute BP sitting:   114 / 60  (left arm)  Vitals Entered By: Tora Perches (June 04, 2009 7:54 AM) CC: chest cong. Is Patient Diabetic? No   Primary Care Provider:  Sonda Primes, MD  CC:  chest cong.Marland Kitchen  History of Present Illness: The patient presents with complaints of sore throat, fever, cough, sinus congestion and drainge of several days duration since Fri. Not better with OTC meds. Chest hurts with coughing. Can't sleep due to cough. Muscle aches are present.  The mucus is colored.   Preventive Screening-Counseling & Management  Alcohol-Tobacco     Smoking Status: never  Current Medications (verified): 1)  Protonix 40 Mg  Tbec (Pantoprazole Sodium) .... Take One Tab Once Daily 2)  Enbrel 50 Mg/ml  Soln (Etanercept) .Marland Kitchen.. 1 Inj Q 1 Wk Sq 3)  Vitamin B-12 1000 Mcg  Tabs (Cyanocobalamin) .... 1/2 Once Daily 4)  Vitamin D3 1000 Unit  Tabs (Cholecalciferol) .... 2 Qd 5)  Levsin/sl 0.125 Mg Subl (Hyoscyamine Sulfate) .Marland Kitchen.. 1-2 Sl or By Mouth Qid Prn 6)  Oxycontin 10 Mg  Tb12 (Oxycodone Hcl) .Marland Kitchen.. 1 By Mouth 2  A Day For Pain. Please,  Fill On or After  04/14/09          . Ok To Fill 1 Day Earlier When The Month Has 31 Days in It. 7)  Lovenox 120 Mg/0.79ml Soln (Enoxaparin Sodium) .... Once Daily 8)  Citalopram Hydrobromide 20 Mg Tabs (Citalopram Hydrobromide) .... Take One Tablet By Mouth Daily 9)  Requip 1 Mg Tabs (Ropinirole Hcl) .... Take 1 By Mouth 3 Times A Day 10)  Phenazopyridine Hcl 100 Mg Tabs (Phenazopyridine Hcl) .... Take 1 Tablet By Mouth Three Times A Day As Needed 11)  Aleve 220 Mg Caps (Naproxen Sodium) .... Take 1 Twice Daily X 7 Days Then As Needed For Pain 12)  Vagifem 25 Mcg Tabs (Estradiol) .Marland Kitchen.. 1 Pv Q 2 D 13)  Atrovent 0.06 % Soln (Ipratropium Bromide) .Marland Kitchen.. 1-2 Sprays in Each Nostril As Needed  For Runny Nose 14)  Lotrisone 1-0.05 % Crea (Clotrimazole-Betamethasone) .... Use Bid 15)  Sm Clotrimazole Vaginal 1 % Crea (Clotrimazole) .... As Dirrected 16)  Hydroxyzine Hcl 25 Mg Tabs (Hydroxyzine Hcl) .Marland Kitchen.. 1-2 Tabs By Mouth Two Times A Day As Needed Itching  Allergies: 1)  ! Xanax 2)  ! Neurontin 3)  ! Effexor 4)  ! Pcn 5)  ! Prednisone 6)  ! * Klonopin 7)  ! Vioxx 8)  ! Sulfa 9)  ! * Volaren 10)  ! * Benzodizzapines 11)  Mirapex (Pramipexole Dihydrochloride) 12)  Cymbalta (Duloxetine Hcl) 13)  Nortriptyline Hcl (Nortriptyline Hcl)  Past History:  Past Medical History: Last updated: 04/30/2009 FMS IMS Pulmonary embolism, hx of  2002 Rheumatoid arthritis    Dr Titus Dubin B12 def Vit D def Osteoarthritis Osteopenia H/o adrenal insuff IBS Tammy Worell NP writes her thyroid meds  for "energy"  Social History: Last updated: 10/05/2008 Retired Single Never Smoked Alcohol use-no Illicit Drug Use - no  Review of Systems       The patient complains of fever, chest pain, dyspnea on exertion, and prolonged cough.    Physical Exam  General:  NAD, overweight-appearing.   Mouth:  Erythematous throat mucosa  and intranasal erythema.  Neck:  Supple; no masses or thyromegaly. Lungs:  Clear throughout to auscultation. Coughing hard Heart:  Regular rate and rhythm; no murmurs, rubs,  or bruits. Abdomen:  NT Msk:  Symmetrical with no gross deformities. Normal posture. Lumbar-sacral spine is tender to palpation over paraspinal muscles and painfull with the ROM  Neurologic:  Alert and  oriented x4;  grossly normal neurologically.   Impression & Recommendations:  Problem # 1:  COUGH (ICD-786.2) Assessment New  Problem # 2:  BRONCHITIS, ACUTE (ICD-466.0) Assessment: New  Her updated medication list for this problem includes:    Zithromax Z-pak 250 Mg Tabs (Azithromycin) .Marland Kitchen... As dirrected    Proair Hfa 108 (90 Base) Mcg/act Aers (Albuterol sulfate) .Marland Kitchen... 2 inh qid as  needed  Complete Medication List: 1)  Protonix 40 Mg Tbec (Pantoprazole sodium) .... Take one tab once daily 2)  Enbrel 50 Mg/ml Soln (Etanercept) .Marland Kitchen.. 1 inj q 1 wk sq 3)  Vitamin B-12 1000 Mcg Tabs (Cyanocobalamin) .... 1/2 once daily 4)  Vitamin D3 1000 Unit Tabs (Cholecalciferol) .... 2 qd 5)  Levsin/sl 0.125 Mg Subl (Hyoscyamine sulfate) .Marland Kitchen.. 1-2 sl or by mouth qid prn 6)  Oxycontin 10 Mg Tb12 (Oxycodone hcl) .Marland Kitchen.. 1 by mouth 2  a day for pain. please,  fill on or after  04/14/09          . ok to fill 1 day earlier when the month has 31 days in it. 7)  Lovenox 120 Mg/0.39ml Soln (Enoxaparin sodium) .... Once daily 8)  Citalopram Hydrobromide 20 Mg Tabs (Citalopram hydrobromide) .... Take one tablet by mouth daily 9)  Requip 1 Mg Tabs (Ropinirole hcl) .... Take 1 by mouth 3 times a day 10)  Phenazopyridine Hcl 100 Mg Tabs (Phenazopyridine hcl) .... Take 1 tablet by mouth three times a day as needed 11)  Aleve 220 Mg Caps (Naproxen sodium) .... Take 1 twice daily x 7 days then as needed for pain 12)  Vagifem 25 Mcg Tabs (Estradiol) .Marland Kitchen.. 1 pv q 2 d 13)  Atrovent 0.06 % Soln (Ipratropium bromide) .Marland Kitchen.. 1-2 sprays in each nostril as needed for runny nose 14)  Lotrisone 1-0.05 % Crea (Clotrimazole-betamethasone) .... Use bid 15)  Sm Clotrimazole Vaginal 1 % Crea (Clotrimazole) .... As dirrected 16)  Hydroxyzine Hcl 25 Mg Tabs (Hydroxyzine hcl) .Marland Kitchen.. 1-2 tabs by mouth two times a day as needed itching 17)  Zithromax Z-pak 250 Mg Tabs (Azithromycin) .... As dirrected 18)  Proair Hfa 108 (90 Base) Mcg/act Aers (Albuterol sulfate) .... 2 inh qid as needed  Patient Instructions: 1)  Call if you are not better in a reasonable amount of time or if worse. Go to ER if feeling really bad! Prescriptions: PROAIR HFA 108 (90 BASE) MCG/ACT AERS (ALBUTEROL SULFATE) 2 inh qid as needed  #3 x 3   Entered and Authorized by:   Tresa Garter MD   Signed by:   Tresa Garter MD on 06/04/2009   Method  used:   [facility]  [place] [address] [facility]       [address]       [place], [place]  [address]       Ph: [phone] or [phone]       Fax: [phone]   RxID:   [id number] ZITHROMAX Z-PAK 250 MG TABS (AZITHROMYCIN) as dirrected  #1 x 0   Entered and Authorized by:  Tresa Garter MD   Signed by:   Tresa Garter MD on 06/04/2009   Method used:   [facility]  [place] [address]       [address]       [place], [place]  [address]       Ph: [phone] or [phone]       Fax: [phone]   RxID:   [id number]

## 2010-06-03 NOTE — Assessment & Plan Note (Signed)
Summary: 2 mos f/u #/cd   Vital Signs:  Patient profile:   76 year old female Height:      62 inches Weight:      155.50 pounds BMI:     28.54 O2 Sat:      98 % on Room air Temp:     98.1 degrees F oral Pulse rate:   75 / minute BP sitting:   126 / 88  (left arm) Cuff size:   regular  Vitals Entered By: Lucious Groves (Sep 09, 2009 8:22 AM)  O2 Flow:  Room air CC: 2 mo f/u./kb Is Patient Diabetic? No Pain Assessment Patient in pain? no        Primary Care Provider:  Sonda Primes, MD  CC:  2 mo f/u./kb.  History of Present Illness: The patient presents for a follow up of FMS - worse; back pain, anxiety, depression and headaches.   Current Medications (verified): 1)  Protonix 40 Mg  Tbec (Pantoprazole Sodium) .... Take One Tab Once Daily 2)  Enbrel 50 Mg/ml  Soln (Etanercept) .Marland Kitchen.. 1 Inj Q 1 Wk Sq 3)  Vitamin D3 10000 Unit Caps (Cholecalciferol) .... Once Daily 4)  Levsin/sl 0.125 Mg Subl (Hyoscyamine Sulfate) .Marland Kitchen.. 1-2 Sl or By Mouth Qid Prn 5)  Oxycontin 10 Mg  Tb12 (Oxycodone Hcl) .Marland Kitchen.. 1 By Mouth  A Day For Pain. Please,  Fill On or After  08/04/08          . Ok To Fill 1 Day Earlier When The Month Has 31 Days in It. 6)  Lovenox 120 Mg/0.14ml Soln (Enoxaparin Sodium) .... Once Daily 7)  Citalopram Hydrobromide 20 Mg Tabs (Citalopram Hydrobromide) .... Take One Tablet By Mouth Daily 8)  Requip 1 Mg Tabs (Ropinirole Hcl) .... Take 1 By Mouth 3 Times A Day 9)  Phenazopyridine Hcl 100 Mg Tabs (Phenazopyridine Hcl) .... Take 1 Tablet By Mouth Three Times A Day As Needed 10)  Aleve 220 Mg Caps (Naproxen Sodium) .... Take 1 Twice Daily X 7 Days Then As Needed For Pain 11)  Vagifem 25 Mcg Tabs (Estradiol) .Marland Kitchen.. 1 Pv Q 2 D 12)  Atrovent 0.06 % Soln (Ipratropium Bromide) .Marland Kitchen.. 1-2 Sprays in Each Nostril As Needed For Runny Nose 13)  Sm Clotrimazole Vaginal 1 % Crea (Clotrimazole) .... As Dirrected 14)  Hydroxyzine Hcl 25 Mg Tabs (Hydroxyzine Hcl) .Marland Kitchen.. 1-2 Tabs By Mouth Two Times A Day  As Needed Itching 15)  Proair Hfa 108 (90 Base) Mcg/act Aers (Albuterol Sulfate) .... 2 Inh Qid As Needed 16)  Vit B12 Inject.  0.21ml .Marland Kitchen.. 1 Shot A Week  Allergies (verified): 1)  ! Xanax 2)  ! Neurontin 3)  ! Effexor 4)  ! Pcn 5)  ! Prednisone 6)  ! * Klonopin 7)  ! Vioxx 8)  ! Sulfa 9)  ! * Volaren 10)  ! * Benzodizzapines 11)  Mirapex (Pramipexole Dihydrochloride) 12)  Cymbalta (Duloxetine Hcl) 13)  Nortriptyline Hcl (Nortriptyline Hcl)  Past History:  Past Medical History: Last updated: 04/30/2009 FMS IMS Pulmonary embolism, hx of  2002 Rheumatoid arthritis    Dr Titus Dubin B12 def Vit D def Osteoarthritis Osteopenia H/o adrenal insuff IBS Tammy Worell NP writes her thyroid meds  for "energy"  Past Surgical History: Last updated: 10/05/2008 Hysterectomy Total knee replacement B cholecystectomy in 2003 tonsillectomy in 1941 appendectomy in 1995 Right Wrist Sugery Bilateral oophorectomy with tummy tuck Cosmetic Surgery  Social History: Last updated: 10/05/2008 Retired Single Never Smoked Alcohol  use-no Illicit Drug Use - no   Impression & Recommendations:  Problem # 1:  CONNECTIVE TISSUE DISEASE (ICD-710.9) Assessment Unchanged On prescription drug  therapy   Problem # 2:  FIBROMYALGIA (ICD-729.1) Assessment: Deteriorated Use stretching and balance exercises  Her updated medication list for this problem includes:    Oxycontin 10 Mg Tb12 (Oxycodone hcl) .Marland Kitchen... 1 by mouth  a day for pain. please,  fill on or after  11/03/08          . ok to fill 1 day earlier when the month has 31 days in it.    Aleve 220 Mg Caps (Naproxen sodium) .Marland Kitchen... Take 1 twice daily x 7 days then as needed for pain  Problem # 3:  B12 DEFICIENCY (ICD-266.2) Assessment: Unchanged On prescription drug  therapy   Problem # 4:  OSTEOPENIA (ICD-733.90) Assessment: Deteriorated BDS is pending per Dr Billy Coast  Complete Medication List: 1)  Oxycontin 10 Mg Tb12 (Oxycodone hcl) .Marland Kitchen..  1 by mouth  a day for pain. please,  fill on or after  11/03/08          . ok to fill 1 day earlier when the month has 31 days in it. 2)  Protonix 40 Mg Tbec (Pantoprazole sodium) .... Take one tab once daily 3)  Enbrel 50 Mg/ml Soln (Etanercept) .Marland Kitchen.. 1 inj q 1 wk sq 4)  Vitamin D3 10000 Unit Caps (Cholecalciferol) .... Once daily 5)  Levsin/sl 0.125 Mg Subl (Hyoscyamine sulfate) .Marland Kitchen.. 1-2 sl or by mouth qid prn 6)  Lovenox 120 Mg/0.60ml Soln (Enoxaparin sodium) .... Once daily 7)  Citalopram Hydrobromide 20 Mg Tabs (Citalopram hydrobromide) .... Take one tablet by mouth daily 8)  Requip 1 Mg Tabs (Ropinirole hcl) .... Take 1 by mouth 3 times a day 9)  Phenazopyridine Hcl 100 Mg Tabs (Phenazopyridine hcl) .... Take 1 tablet by mouth three times a day as needed 10)  Aleve 220 Mg Caps (Naproxen sodium) .... Take 1 twice daily x 7 days then as needed for pain 11)  Vagifem 25 Mcg Tabs (Estradiol) .Marland Kitchen.. 1 pv q 2 d 12)  Atrovent 0.06 % Soln (Ipratropium bromide) .Marland Kitchen.. 1-2 sprays in each nostril as needed for runny nose 13)  Sm Clotrimazole Vaginal 1 % Crea (Clotrimazole) .... As dirrected 14)  Hydroxyzine Hcl 25 Mg Tabs (Hydroxyzine hcl) .Marland Kitchen.. 1-2 tabs by mouth two times a day as needed itching 15)  Proair Hfa 108 (90 Base) Mcg/act Aers (Albuterol sulfate) .... 2 inh qid as needed 16)  Vit B12 Inject. 0.63ml  .Marland Kitchen.. 1 shot a week  Patient Instructions: 1)  Please schedule a follow-up appointment in 2 months. Prescriptions: OXYCONTIN 10 MG  TB12 (OXYCODONE HCL) 1 by mouth  a day for pain. Please,  fill on or after  11/03/08          . OK to fill 1 day earlier when the month has 31 days in it.  #30 x 0   Entered and Authorized by:   Tresa Garter MD   Signed by:   Tresa Garter MD on 09/09/2009   Method used:   Print then Give to Patient   RxID:   [id number] OXYCONTIN 10 MG  TB12 (OXYCODONE HCL) 1 by mouth  a day for pain. Please,  fill on or after  10/04/08          . OK to fill 1 day earlier  when the month has 31 days in it.  #30 x  0   Entered and Authorized by:   Tresa Garter MD   Signed by:   Tresa Garter MD on 09/09/2009   Method used:   Print then Give to Patient   RxID:   [id number]

## 2010-06-03 NOTE — Letter (Signed)
Summary: [facility]   Imported By: Sherian Rein 04/08/2010 13:32:45  _____________________________________________________________________  External Attachment:    Type:   Image     Comment:   External Document

## 2010-06-03 NOTE — Progress Notes (Signed)
Summary: REQ FOR RX  Phone Note Call from Patient   Summary of Call: Pt is c/o continued vaginal discomfort. She is req rx for vaginal cream or suppositories for yeast infection.  Initial call taken by: Lamar Sprinkles, CMA,  May 10, 2009 10:14 AM  Follow-up for Phone Call        OK clotrimazol Follow-up by: Tresa Garter MD,  May 12, 2009 11:26 PM  Additional Follow-up for Phone Call Additional follow up Details #1::        left mess to call office back......................Marland KitchenLamar Sprinkles, CMA  May 13, 2009 10:36 AM     Additional Follow-up for Phone Call Additional follow up Details #2::    Left mess for pt to check with her pharm & call office with any questions Follow-up by: Lamar Sprinkles, CMA,  May 13, 2009 4:31 PM  New/Updated Medications: SM CLOTRIMAZOLE VAGINAL 1 % CREA (CLOTRIMAZOLE) as dirrected Prescriptions: SM CLOTRIMAZOLE VAGINAL 1 % CREA (CLOTRIMAZOLE) as dirrected  #1 x 1   Entered and Authorized by:   Tresa Garter MD   Signed by:   Lamar Sprinkles, CMA on 05/13/2009   Method used:   [facility]  [place] [address] [facility]       [address]       [place], [place]  [address]       Ph: [phone] or [phone]       Fax: [phone]   RxID:   [id number]

## 2010-06-03 NOTE — Progress Notes (Signed)
Summary: oxycontin  Phone Note Call from Patient   Summary of Call: Patient is requesting a call regarding oxycontin. Need to call pharmacy and get fill dates.  Initial call taken by: Lamar Sprinkles, CMA,  Sep 23, 2009 9:58 AM  Follow-up for Phone Call        Patient filled Rx for Oxycontin 10mg  1 once daily #30 on 4/19. If you look at rx's given they should be for the 19th. Pt was given rx's to be filled 3/3, 4/3, 6/3 and 7/3. No rx was given to fill on 5/3, ok for rx to fill 5/23 2 wk supply?  Follow-up by: Lamar Sprinkles, CMA,  Sep 23, 2009 1:40 PM  Additional Follow-up for Phone Call Additional follow up Details #1::        OK Thx Additional Follow-up by: Tresa Garter MD,  Sep 23, 2009 5:20 PM    Additional Follow-up for Phone Call Additional follow up Details #2::    Pt informed  Follow-up by: Lamar Sprinkles, CMA,  Sep 23, 2009 5:28 PM  New/Updated Medications: OXYCONTIN 10 MG  TB12 (OXYCODONE HCL) 1 by mouth  a day for pain. Please,  fill on or after  09/23/08          . OK to fill 1 day earlier when the month has 31 days in it. OXYCONTIN 10 MG  TB12 (OXYCODONE HCL) 1 by mouth  a day for pain. Please,  fill on or after  11/03/09          . OK to fill 1 day earlier when the month has 31 days in it. Prescriptions: OXYCONTIN 10 MG  TB12 (OXYCODONE HCL) 1 by mouth  a day for pain. Please,  fill on or after  09/23/08          . OK to fill 1 day earlier when the month has 31 days in it.  #12 x 0   Entered by:   Lamar Sprinkles, CMA   Authorized by:   Tresa Garter MD   Signed by:   Lamar Sprinkles, CMA on 09/23/2009   Method used:   Print then Give to Patient   RxID:   [id number]

## 2010-06-03 NOTE — Letter (Signed)
[facility]   Imported By: Sherian Rein 11/19/2009 08:48:53  _____________________________________________________________________  External Attachment:    Type:   Image     Comment:   External Document

## 2010-06-03 NOTE — Letter (Signed)
Summary: [facility]  [facility]   Imported By: Sherian Rein 09/13/2009 08:34:12  _____________________________________________________________________  External Attachment:    Type:   Image     Comment:   External Document

## 2010-06-03 NOTE — Progress Notes (Signed)
Summary: Bladder.vaginal infection  Phone Note Call from Patient Call back at [facility] Phone [phone]   Summary of Call: Patient left message on triage that she has bladder/vaginal infection. Patient started taking sulfa as prescribed two times a day x4 days, and otc meds (vagisil,etc) not helping. Patient c/o severe burning and itch and would like prescription [facility] [place]. Please advise. Initial call taken by: Lucious Groves,  May 08, 2009 1:03 PM  Follow-up for Phone Call        Lotrisone topically Diflucan by mouth OV if sick Follow-up by: Tresa Garter MD,  May 08, 2009 1:06 PM  Additional Follow-up for Phone Call Additional follow up Details #1::        iformed pt. she will call with an update after completing all medications Additional Follow-up by: Ami Bullins CMA,  May 08, 2009 1:13 PM    New/Updated Medications: LOTRISONE 1-0.05 % CREA (CLOTRIMAZOLE-BETAMETHASONE) use bid DIFLUCAN 100 MG TABS (FLUCONAZOLE) Take two tablets on the first day, than  1 by mouth once daily untill gone for a fungul infection Prescriptions: DIFLUCAN 100 MG TABS (FLUCONAZOLE) Take two tablets on the first day, than  1 by mouth once daily untill gone for a fungul infection  #11 x 1   Entered and Authorized by:   Tresa Garter MD   Signed by:   Bill Salinas CMA on 05/08/2009   Method used:   [facility]  [place] [address] [facility]       [address]       [place], [place]  [address]       Ph: [phone] or [phone]       Fax: [phone]   RxID:   [id number] LOTRISONE 1-0.05 % CREA (CLOTRIMAZOLE-BETAMETHASONE) use bid  #90 g x 1   Entered and Authorized by:   Tresa Garter MD   Signed by:   Bill Salinas CMA on 05/08/2009   Method used:   [facility]  [place] [address]       [address]       [place], [place]  [address]       Ph: [phone] or [phone]       Fax: [phone]   RxID:    [id number]

## 2010-06-03 NOTE — Progress Notes (Signed)
Summary: REFILL  - Oxycontin  Phone Note Refill Request Call back at [facility] Phone [phone] Call back at [phone]   Refills Requested: Medication #1:  OXYCONTIN 10 MG  TB12 1 by mouth  a day for pain. Please Pt missed office visit today b/c of car trouble. She is sitting in town now waiting on repairs. Please advise.   Initial call taken by: Lamar Sprinkles, CMA,  November 05, 2009 10:02 AM  Follow-up for Phone Call        ok to ref Follow-up by: Tresa Garter MD,  November 06, 2009 8:07 AM  Additional Follow-up for Phone Call Additional follow up Details #1::        See previous phone note, prescription not needed due to patient already having one dated for 11-03-09. Patient aware. Additional Follow-up by: Lucious Groves,  November 06, 2009 2:31 PM

## 2010-06-03 NOTE — Progress Notes (Signed)
Summary: NEEDS OV TODAY  Phone Note Call from Patient   Summary of Call: Pt left vm, thinks she has bronchitis, wanted to know if she needs office visit. Yes, please help schedule pt office visit for eval. THANKS Initial call taken by: Lamar Sprinkles, CMA,  June 03, 2009 8:53 AM  Follow-up for Phone Call        Pt scheduled for tomorrow Follow-up by: Lamar Sprinkles, CMA,  June 03, 2009 10:48 AM

## 2010-06-03 NOTE — Progress Notes (Signed)
Summary: lab orders  Phone Note Call from Patient Call back at Home Phone [phone]   Summary of Call: Patient called to confirm that she had some lab orders in our system. I made patient aware that there are orders in [facility] from 06-21-09. Patient will come on Monday for lab. Initial call taken by: Lucious Groves,  July 26, 2009 9:32 AM

## 2010-06-03 NOTE — Progress Notes (Signed)
Summary: Thrush  Phone Note Call from Patient Call back at [facility] Phone [phone]   Summary of Call: Pt was seen 2/01, completed her medications. She now has what she thinks is thrush in her mouth and throat. Has raw sores and "slime" in mouth & throat. Also continues to c/o fever and productive cough with gray mucus. Please advise.  Initial call taken by: Lamar Sprinkles, CMA,  June 18, 2009 12:20 PM  Follow-up for Phone Call        OK Nystatin OV if  not better in a reasonable amount of time or if worse Follow-up by: Tresa Garter MD,  June 18, 2009 1:08 PM  Additional Follow-up for Phone Call Additional follow up Details #1::        Pt also concerned about the continued fever & productive cough, do you want to see her again for this?  Additional Follow-up by: Lamar Sprinkles, CMA,  June 18, 2009 1:14 PM    Additional Follow-up for Phone Call Additional follow up Details #2::    Start Levaquin x 10 d .ROV in 10 d  Go to ER if feeling really bad!  Follow-up by: Tresa Garter MD,  June 18, 2009 5:53 PM  Additional Follow-up for Phone Call Additional follow up Details #3:: Details for Additional Follow-up Action Taken: Pt informed  Additional Follow-up by: Lamar Sprinkles, CMA,  June 18, 2009 6:01 PM  New/Updated Medications: NYSTATIN 100000 UNIT/ML SUSP (NYSTATIN) 5 ml by mouth swishh, gargle and swallow qid for thrush LEVAQUIN 500 MG TABS (LEVOFLOXACIN) 1 by mouth qd Prescriptions: LEVAQUIN 500 MG TABS (LEVOFLOXACIN) 1 by mouth qd  #10 x 0   Entered and Authorized by:   Tresa Garter MD   Signed by:   Lamar Sprinkles, CMA on 06/18/2009   Method used:   [facility]        [facility]  [place] [address] [facility]       [address]       [place], [place]  [address]       Ph: [phone] or [phone]       Fax: [phone]   RxID:   [id number] NYSTATIN 100000 UNIT/ML SUSP (NYSTATIN) 5 ml by mouth swishh, gargle and swallow qid for thrush   #300 ml x 1   Entered and Authorized by:   Tresa Garter MD   Signed by:   Lamar Sprinkles, CMA on 06/18/2009   Method used:   [facility]        [facility]  [place] [address] [facility]       [address]       [place], [place]  [address]       Ph: [phone] or [phone]       Fax: [phone]   RxID:   [id number]

## 2010-06-03 NOTE — Assessment & Plan Note (Signed)
Summary: recurrent u/a? / SD   Vital Signs:  Patient profile:   76 year old female Weight:      161 pounds Temp:     97 degrees F oral Pulse rate:   76 / minute BP sitting:   114 / 86  (left arm)  Vitals Entered By: Tora Perches (May 16, 2009 3:13 PM) CC: returning u/a Is Patient Diabetic? No   Primary Care Provider:  Sonda Primes, MD  CC:  returning u/a.  History of Present Illness: C/o urinary burning - she took a sulfa med x4 d - not better  Preventive Screening-Counseling & Management  Alcohol-Tobacco     Smoking Status: never  Current Medications (verified): 1)  Protonix 40 Mg  Tbec (Pantoprazole Sodium) .... Take One Tab Once Daily 2)  Enbrel 50 Mg/ml  Soln (Etanercept) .Marland Kitchen.. 1 Inj Q 1 Wk Sq 3)  Vitamin B-12 1000 Mcg  Tabs (Cyanocobalamin) .... 1/2 Once Daily 4)  Vitamin D3 1000 Unit  Tabs (Cholecalciferol) .... 2 Qd 5)  Meclizine Hcl 12.5 Mg Tabs (Meclizine Hcl) .Marland Kitchen.. 1-2 Qid Prn 6)  Antivert 25 Mg Tabs (Meclizine Hcl) 7)  Levsin/sl 0.125 Mg Subl (Hyoscyamine Sulfate) .Marland Kitchen.. 1-2 Sl or By Mouth Qid Prn 8)  Oxycontin 10 Mg  Tb12 (Oxycodone Hcl) .Marland Kitchen.. 1 By Mouth 2  A Day For Pain. Please,  Fill On or After  04/14/09          . Ok To Fill 1 Day Earlier When The Month Has 31 Days in It. 9)  Lovenox 120 Mg/0.29ml Soln (Enoxaparin Sodium) .... Once Daily 10)  Citalopram Hydrobromide 20 Mg Tabs (Citalopram Hydrobromide) .... Take One Tablet By Mouth Daily 11)  Requip 1 Mg Tabs (Ropinirole Hcl) .... Take 1 By Mouth 3 Times A Day 12)  Phenazopyridine Hcl 100 Mg Tabs (Phenazopyridine Hcl) .... Take 1 Tablet By Mouth Three Times A Day As Needed 13)  Aleve 220 Mg Caps (Naproxen Sodium) .... Take 1 Twice Daily X 7 Days Then As Needed For Pain 14)  Vagifem 25 Mcg Tabs (Estradiol) .Marland Kitchen.. 1 Pv Q 2 D 15)  Atrovent 0.06 % Soln (Ipratropium Bromide) .Marland Kitchen.. 1-2 Sprays in Each Nostril As Needed For Runny Nose 16)  Lotrisone 1-0.05 % Crea (Clotrimazole-Betamethasone) .... Use Bid 17)   Diflucan 100 Mg Tabs (Fluconazole) .... Take Two Tablets On The First Day, Than  1 By Mouth Once Daily Untill Gone For A Fungul Infection 18)  Sm Clotrimazole Vaginal 1 % Crea (Clotrimazole) .... As Dirrected  Allergies: 1)  ! Xanax 2)  ! Neurontin 3)  ! Effexor 4)  ! Pcn 5)  ! Prednisone 6)  ! * Klonopin 7)  ! Vioxx 8)  ! Sulfa 9)  ! * Volaren 10)  ! * Benzodizzapines 11)  Mirapex (Pramipexole Dihydrochloride) 12)  Cymbalta (Duloxetine Hcl) 13)  Nortriptyline Hcl (Nortriptyline Hcl)  Past History:  Past Medical History: Last updated: 04/30/2009 FMS IMS Pulmonary embolism, hx of  2002 Rheumatoid arthritis    Dr Titus Dubin B12 def Vit D def Osteoarthritis Osteopenia H/o adrenal insuff IBS Tammy Worell NP writes her thyroid meds  for "energy"  Social History: Last updated: 10/05/2008 Retired Single Never Smoked Alcohol use-no Illicit Drug Use - no  Review of Systems  The patient denies fever, chest pain, and abdominal pain.    Physical Exam  General:  NAD, overweight-appearing.   Mouth:  Oral mucosa and oropharynx without lesions or exudates.  Teeth in good repair. Lungs:  Clear throughout to auscultation. Heart:  Regular rate and rhythm; no murmurs, rubs,  or bruits. Abdomen:  NT Msk:  Symmetrical with no gross deformities. Normal posture. Lumbar-sacral spine is tender to palpation over paraspinal muscles and painfull with the ROM  Extremities:  No clubbing, cyanosis, edema or deformities noted. Skin:  Hemorrhagic excoriations present on upper and lower extr B cheeks w/erythema Psych:  Oriented X3.  Less sad. Not suicidal and depressed affect.     Impression & Recommendations:  Problem # 1:  CYSTITIS (ICD-595.9) Assessment New  Her updated medication list for this problem includes:    Ciprofloxacin Hcl 500 Mg Tabs (Ciprofloxacin hcl) .Marland Kitchen... 1 by mouth bid  Orders: T-Urine Culture (Spectrum Order) [id number]) TLB-Udip ONLY (81003-UDIP)  Problem #  2:  PRURITUS (ICD-698.9) poss allergy to meds Assessment: Deteriorated Hold 1 med at the time x 1-2 wks (except for Lovenox)  Problem # 3:  PULMONARY EMBOLISM (ICD-415.19) Assessment: Unchanged  Her updated medication list for this problem includes:    Lovenox 120 Mg/0.42ml Soln (Enoxaparin sodium) ..... Once daily  Problem # 4:  BRONCHITIS, ACUTE (ICD-466.0) Assessment: Improved  Complete Medication List: 1)  Protonix 40 Mg Tbec (Pantoprazole sodium) .... Take one tab once daily 2)  Enbrel 50 Mg/ml Soln (Etanercept) .Marland Kitchen.. 1 inj q 1 wk sq 3)  Vitamin B-12 1000 Mcg Tabs (Cyanocobalamin) .... 1/2 once daily 4)  Vitamin D3 1000 Unit Tabs (Cholecalciferol) .... 2 qd 5)  Levsin/sl 0.125 Mg Subl (Hyoscyamine sulfate) .Marland Kitchen.. 1-2 sl or by mouth qid prn 6)  Oxycontin 10 Mg Tb12 (Oxycodone hcl) .Marland Kitchen.. 1 by mouth 2  a day for pain. please,  fill on or after  04/14/09          . ok to fill 1 day earlier when the month has 31 days in it. 7)  Lovenox 120 Mg/0.80ml Soln (Enoxaparin sodium) .... Once daily 8)  Citalopram Hydrobromide 20 Mg Tabs (Citalopram hydrobromide) .... Take one tablet by mouth daily 9)  Requip 1 Mg Tabs (Ropinirole hcl) .... Take 1 by mouth 3 times a day 10)  Phenazopyridine Hcl 100 Mg Tabs (Phenazopyridine hcl) .... Take 1 tablet by mouth three times a day as needed 11)  Aleve 220 Mg Caps (Naproxen sodium) .... Take 1 twice daily x 7 days then as needed for pain 12)  Vagifem 25 Mcg Tabs (Estradiol) .Marland Kitchen.. 1 pv q 2 d 13)  Atrovent 0.06 % Soln (Ipratropium bromide) .Marland Kitchen.. 1-2 sprays in each nostril as needed for runny nose 14)  Lotrisone 1-0.05 % Crea (Clotrimazole-betamethasone) .... Use bid 15)  Diflucan 100 Mg Tabs (Fluconazole) .... Take two tablets on the first day, than  1 by mouth once daily untill gone for a fungul infection 16)  Sm Clotrimazole Vaginal 1 % Crea (Clotrimazole) .... As dirrected 17)  Hydroxyzine Hcl 25 Mg Tabs (Hydroxyzine hcl) .Marland Kitchen.. 1-2 tabs by mouth two times a day  as needed itching 18)  Ciprofloxacin Hcl 500 Mg Tabs (Ciprofloxacin hcl) .Marland Kitchen.. 1 by mouth bid  Patient Instructions: 1)  Keep return office visit  2)  Call if you are not better in a reasonable amount of time or if worse.  Prescriptions: DIFLUCAN 100 MG TABS (FLUCONAZOLE) Take two tablets on the first day, than  1 by mouth once daily untill gone for a fungul infection  #11 x 1   Entered and Authorized by:   Tresa Garter MD   Signed by:   Tresa Garter MD on  05/16/2009   Method used:   Print then Give to Patient   RxID:   [id number] CIPROFLOXACIN HCL 500 MG TABS (CIPROFLOXACIN HCL) 1 by mouth bid  #20 x 1   Entered and Authorized by:   Tresa Garter MD   Signed by:   Tresa Garter MD on 05/16/2009   Method used:   Print then Give to Patient   RxID:   [id number] HYDROXYZINE HCL 25 MG TABS (HYDROXYZINE HCL) 1-2 tabs by mouth two times a day as needed itching  #60 x 3   Entered and Authorized by:   Tresa Garter MD   Signed by:   Tresa Garter MD on 05/16/2009   Method used:   Print then Give to Patient   RxID:   [id number]

## 2010-06-03 NOTE — Letter (Signed)
Summary: Patient [facility] Biopsy Results  [facility]  [address] [place], [place] [address]   Phone: [phone]  Fax: [phone]        February 03, 2010 MRN: [id number]    Diane Abbott [address] [place], [place]  [address]    Dear [name],  I am pleased to inform you that the biopsies taken during your recent endoscopic examination did not show any evidence of cancer upon pathologic examination.  Additional information/recommendations:  __No further action is needed at this time.  Please follow-up with      your primary care physician for your other healthcare needs.  __ Please call [phone] to schedule a return visit to review      your condition.  _x_ Continue with the treatment plan as outlined on the day of your      exam.     Please call us if you are having persistent problems or have questions about your condition that have not been fully answered at this time.  Sincerely,  Hart Carwin MD  This letter has been electronically signed by your physician.  Appended Document: Patient Notice-Endo Biopsy Results letter mailed

## 2010-06-03 NOTE — Letter (Signed)
Summary: [facility] Instructions  [facility]  [address] [place], [place] [address]   Phone: [phone]  Fax: [phone]       LELA MURFIN    [birth year]    MRN: [id number]       Procedure Day Dorna Bloom: Wednesday 01/29/10     Arrival Time: 2:30 pm     Procedure Time: 3:30 pm     Location of Procedure:                    _ x_  [facility] (4th Floor)  PREPARATION FOR COLONOSCOPY WITH MIRALAX  Starting 5 days prior to your procedure 01/24/10 do not eat nuts, seeds, popcorn, corn, beans, peas,  salads, or any raw vegetables.  Do not take any fiber supplements (e.g. Metamucil, Citrucel, and Benefiber). ____________________________________________________________________________________________________   THE DAY BEFORE YOUR PROCEDURE         DATE: 01/28/10 DAY: Tuesday  1   Drink clear liquids the entire day-NO SOLID FOOD  2   Do not drink anything colored red or purple.  Avoid juices with pulp.  No orange juice.  3   Drink at least 64 oz. (8 glasses) of fluid/clear liquids during the day to prevent dehydration and help the prep work efficiently.  CLEAR LIQUIDS INCLUDE: Water Jello Ice Popsicles Tea (sugar ok, no milk/cream) Powdered fruit flavored drinks Coffee (sugar ok, no milk/cream) Gatorade Juice: apple, white grape, white cranberry  Lemonade Clear bullion, consomm, broth Carbonated beverages (any kind) Strained chicken noodle soup Hard Candy  4   Mix the entire bottle of Miralax with 64 oz. of Gatorade/Powerade in the morning and put in the refrigerator to chill.  5   At 3:00 pm take 2 Dulcolax/Bisacodyl tablets.  6   At 4:30 pm take one Reglan/Metoclopramide tablet.  7  Starting at 5:00 pm drink one 8 oz glass of the Miralax mixture every 15-20 minutes until you have finished drinking the entire 64 oz.  You should finish drinking prep around 7:30 or 8:00 pm.  8   If you are nauseated, you may take the 2nd Reglan/Metoclopramide tablet  at 6:30 pm.        9    At 8:00 pm take 2 more DULCOLAX/Bisacodyl tablets.         THE DAY OF YOUR PROCEDURE      DATE:  01/29/10 DAY: Wednesday  You may drink clear liquids until 1:30 pm  (2 HOURS BEFORE PROCEDURE).   MEDICATION INSTRUCTIONS  Unless otherwise instructed, you should take regular prescription medications with a small sip of water as early as possible the morning of your procedure.  STOP TAKING LOVENOX INJECTION 24 HOURS PRIOR TO PROCEDURE PER DR Micca Matura. YOU WILL GET FURTHER INSTRUCTIONS ON RESTARTING LOVENOX AFTER YOUR PROCEDURE HAS BEEN COMPLETED.       OTHER INSTRUCTIONS  You will need a responsible adult at least 74 years of age to accompany you and drive you home.   This person must remain in the waiting room during your procedure.  Wear loose fitting clothing that is easily removed.  Leave jewelry and other valuables at home.  However, you may wish to bring a book to read or an iPod/MP3 player to listen to music as you wait for your procedure to start.  Remove all body piercing jewelry and leave at home.  Total time from sign-in until discharge is approximately 2-3 hours.  You should go home directly after your procedure  and rest.  You can resume normal activities the day after your procedure.  The day of your procedure you should not:   Drive   Make legal decisions   Operate machinery   Drink alcohol   Return to work  You will receive specific instructions about eating, activities and medications before you leave.   The above instructions have been reviewed and explained to me by   Lamona Curl CMA Duncan Dull)  January 22, 2010 12:26 PM     I fully understand and can verbalize these instructions _____________________________ Date 01/22/10

## 2010-06-05 NOTE — Assessment & Plan Note (Signed)
Summary: 2 mos f/u #/cd   Vital Signs:  Patient profile:   76 year old female Height:      62 inches Weight:      158 pounds BMI:     29.00 Temp:     98.5 degrees F oral Pulse rate:   88 / minute Pulse rhythm:   regular Resp:     16 per minute BP sitting:   134 / 70  (left arm) Cuff size:   regular  Vitals Entered By: Lanier Prude, CMA(AAMA) (April 21, 2010 8:06 AM) CC: 2 mo f/u  Is Patient Diabetic? No   Primary Care Provider:  Sonda Primes, MD  CC:  2 mo f/u .  History of Present Illness: The patient presents for a follow up of FMS, back pain, anxiety, depression and headaches.   Current Medications (verified): 1)  Oxycontin 10 Mg  Tb12 (Oxycodone Hcl) .Marland Kitchen.. 1 By Mouth Twice A Day For Pain. Please,  Fill On or After  03/06/10          . Ok To Fill 1 Day Earlier When The Month Has 31 Days in It. 2)  Protonix 40 Mg  Tbec (Pantoprazole Sodium) .... Take One Tab Once Daily 3)  Enbrel 50 Mg/ml  Soln (Etanercept) .Marland Kitchen.. 1 Inj Q 1 Wk Sq 4)  Vitamin D3 10000 Unit Caps (Cholecalciferol) .... Once Daily 5)  Levsin/sl 0.125 Mg Subl (Hyoscyamine Sulfate) .Marland Kitchen.. 1-2 Sl or By Mouth Qid Prn 6)  Lovenox 120 Mg/0.17ml Soln (Enoxaparin Sodium) .Marland Kitchen.. 1 Injection Daily 7)  Requip 1 Mg Tabs (Ropinirole Hcl) .... Take 1 By Mouth 3 Times A Day 8)  Aleve 220 Mg Caps (Naproxen Sodium) .... Take 1 Twice Daily X 7 Days Then As Needed For Pain 9)  Vagifem 25 Mcg Tabs (Estradiol) .Marland Kitchen.. 1 Pv Q 2 D 10)  Atrovent 0.06 % Soln (Ipratropium Bromide) .Marland Kitchen.. 1-2 Sprays in Each Nostril As Needed For Runny Nose 11)  Sm Clotrimazole Vaginal 1 % Crea (Clotrimazole) .... As Dirrected 12)  Vit B12 Inject.  0.39ml .Marland Kitchen.. 1 Shot A Week 13)  Ferro-Bob 325 (65 Fe) Mg Tabs (Ferrous Sulfate) .Marland Kitchen.. 1 By Mouth Once Daily 14)  Miralax   Powd (Polyethylene Glycol 3350) .Marland Kitchen.. 1 Capful Per Day 15)  Align  Caps (Probiotic Product) .... Take 1 Tablet By Mouth Once A Day 16)  Nexium 40 Mg Cpdr (Esomeprazole Magnesium) .... Take 1 Tablet  By Mouth Two Times A Day  Allergies (verified): 1)  ! Xanax 2)  ! Neurontin 3)  ! Effexor 4)  ! Pcn 5)  ! Prednisone 6)  ! * Klonopin 7)  ! Vioxx 8)  ! Sulfa 9)  ! * Volaren 10)  ! * Benzodizzapines 11)  Mirapex (Pramipexole Dihydrochloride) 12)  Cymbalta (Duloxetine Hcl) 13)  Nortriptyline Hcl (Nortriptyline Hcl)  Past History:  Past Medical History: Last updated: 01/22/2010 FMS IMS Pulmonary embolism, hx of  2002 Rheumatoid arthritis    Dr Titus Dubin B12 def Vit D def Osteoarthritis Osteopenia H/o adrenal insuff IBS Tammy Worell NP writes her thyroid meds  for "energy" blood clot below the knee 2008  Social History: Last updated: 10/05/2008 Retired Single Never Smoked Alcohol use-no Illicit Drug Use - no  Review of Systems       The patient complains of difficulty walking and depression.  The patient denies fever, dyspnea on exertion, and abdominal pain.    Physical Exam  General:  NAD, overweight-appearing.   Nose:  Erythematous throat  mucosa and intranasal erythema.  Mouth:  Erythematous throat mucosa and intranasal erythema. No thrush Lungs:  Clear throughout to auscultation. Coughing hard Heart:  Regular rate and rhythm; no murmurs, rubs,  or bruits. Abdomen:  NT Msk:  Symmetrical with no gross deformities. Normal posture. Lumbar-sacral spine is tender to palpation over paraspinal muscles and painfull with the ROM  Extremities:  No clubbing, cyanosis, edema or deformities noted. Neurologic:  Alert and  oriented x4;  grossly normal neurologically. Skin:  Hemorrhagic excoriations present on upper and lower extr B cheeks w/erythema Psych:  Oriented X3.  Less sad. Not suicidal and depressed affect.     Impression & Recommendations:  Problem # 1:  CONNECTIVE TISSUE DISEASE (ICD-710.9) Assessment Unchanged On the regimen of medicine(s) reflected in the chart    Problem # 2:  FIBROMYALGIA (ICD-729.1) better on higher dose Oxy Assessment:  Improved  Her updated medication list for this problem includes:    Oxycontin 10 Mg Tb12 (Oxycodone hcl) .Marland Kitchen... 1 by mouth twice a day for pain. please,  fill on or after  06/06/10          . ok to fill 1 day earlier when the month has 31 days in it.    Aleve 220 Mg Caps (Naproxen sodium) .Marland Kitchen... Take 1 twice daily x 7 days then as needed for pain  Problem # 3:  PULMONARY EMBOLISM (ICD-415.19) Assessment: Comment Only  Her updated medication list for this problem includes:    Lovenox 120 Mg/0.26ml Soln (Enoxaparin sodium) .Marland Kitchen... 1 injection daily  Problem # 4:  B12 DEFICIENCY (ICD-266.2) Assessment: Unchanged On the regimen of medicine(s) reflected in the chart    Complete Medication List: 1)  Oxycontin 10 Mg Tb12 (Oxycodone hcl) .Marland Kitchen.. 1 by mouth twice a day for pain. please,  fill on or after  06/06/10          . ok to fill 1 day earlier when the month has 31 days in it. 2)  Protonix 40 Mg Tbec (Pantoprazole sodium) .... Take one tab once daily 3)  Enbrel 50 Mg/ml Soln (Etanercept) .Marland Kitchen.. 1 inj q 1 wk sq 4)  Vitamin D3 10000 Unit Caps (Cholecalciferol) .... Once daily 5)  Levsin/sl 0.125 Mg Subl (Hyoscyamine sulfate) .Marland Kitchen.. 1-2 sl or by mouth qid prn 6)  Lovenox 120 Mg/0.27ml Soln (Enoxaparin sodium) .Marland Kitchen.. 1 injection daily 7)  Requip 1 Mg Tabs (Ropinirole hcl) .... Take 1 by mouth 3 times a day 8)  Aleve 220 Mg Caps (Naproxen sodium) .... Take 1 twice daily x 7 days then as needed for pain 9)  Vagifem 25 Mcg Tabs (Estradiol) .Marland Kitchen.. 1 pv q 2 d 10)  Atrovent 0.06 % Soln (Ipratropium bromide) .Marland Kitchen.. 1-2 sprays in each nostril as needed for runny nose 11)  Sm Clotrimazole Vaginal 1 % Crea (Clotrimazole) .... As dirrected 12)  Vit B12 Inject. 0.22ml  .Marland Kitchen.. 1 shot a week 13)  Ferro-bob 325 (65 Fe) Mg Tabs (Ferrous sulfate) .Marland Kitchen.. 1 by mouth once daily 14)  Miralax Powd (Polyethylene glycol 3350) .Marland Kitchen.. 1 capful per day 15)  Align Caps (Probiotic product) .... Take 1 tablet by mouth once a day 16)  Nexium 40 Mg  Cpdr (Esomeprazole magnesium) .... Take 1 tablet by mouth two times a day  Patient Instructions: 1)  Please schedule a follow-up appointment in 2  months. 2)  BMP prior to visit, ICD-9: 3)  Vit B12 and Vit D 268.9  266.20 Prescriptions: OXYCONTIN 10 MG  TB12 (OXYCODONE  HCL) 1 by mouth twice a day for pain. Please,  fill on or after  06/06/10          . OK to fill 1 day earlier when the month has 31 days in it.  #60 x 0   Entered and Authorized by:   Tresa Garter MD   Signed by:   Tresa Garter MD on 04/21/2010   Method used:   Print then Give to Patient   RxID:   [id number] OXYCONTIN 10 MG  TB12 (OXYCODONE HCL) 1 by mouth twice a day for pain. Please,  fill on or after  05/06/10          . OK to fill 1 day earlier when the month has 31 days in it.  #60 x 0   Entered and Authorized by:   Tresa Garter MD   Signed by:   Tresa Garter MD on 04/21/2010   Method used:   Print then Give to Patient   RxID:   [id number]    Orders Added: 1)  Est. Patient Level IV [84696]

## 2010-06-05 NOTE — Letter (Signed)
Summary:  [facility]  [facility]   Imported By: Lester Venedy 04/23/2010 12:01:23  _____________________________________________________________________  External Attachment:    Type:   Image     Comment:   External Document

## 2010-06-05 NOTE — Letter (Signed)
Summary: [facility]  [facility]   Imported By: Sherian Rein 05/20/2010 09:13:33  _____________________________________________________________________  External Attachment:    Type:   Image     Comment:   External Document

## 2010-06-16 ENCOUNTER — Other Ambulatory Visit: Payer: Self-pay

## 2010-06-17 ENCOUNTER — Telehealth: Payer: Self-pay | Admitting: Internal Medicine

## 2010-06-23 ENCOUNTER — Ambulatory Visit: Payer: Self-pay | Admitting: Internal Medicine

## 2010-06-25 NOTE — Progress Notes (Signed)
Summary: Brand name Requip not covered  Phone Note Other Incoming   Caller: [facility] Summary of Call: rec a fax from [facility] stating Requip is not on pt's formulary or is covered only under certain conditons that must be authorized through a coverage review.  We can either 1.)  write a new Rx for generic (Ropinirole) or 2.) Request a coverage review for Requip if BMN. Call [phone]  Please advise Initial call taken by: Lanier Prude, [facility]),  June 17, 2010 2:17 PM  Follow-up for Phone Call        ok generic Follow-up by: Tresa Garter MD,  June 19, 2010 9:41 AM    New/Updated Medications: ROPINIROLE HCL 1 MG TABS (ROPINIROLE HCL) take 1 by mouth 3 times a day Prescriptions: ROPINIROLE HCL 1 MG TABS (ROPINIROLE HCL) take 1 by mouth 3 times a day  #16mos x 3   Entered by:   Rock Nephew CMA   Authorized by:   Tresa Garter MD   Signed by:   Rock Nephew CMA on 06/19/2010   Method used:   Faxed to ...       [facility]* (retail)             ,          Ph: [phone]       Fax: [phone]   RxID:   [id number]

## 2010-07-01 ENCOUNTER — Ambulatory Visit (INDEPENDENT_AMBULATORY_CARE_PROVIDER_SITE_OTHER): Payer: Medicare Other | Admitting: Internal Medicine

## 2010-07-01 ENCOUNTER — Encounter: Payer: Self-pay | Admitting: Internal Medicine

## 2010-07-01 ENCOUNTER — Other Ambulatory Visit: Payer: Medicare Other

## 2010-07-01 ENCOUNTER — Other Ambulatory Visit: Payer: Self-pay | Admitting: Internal Medicine

## 2010-07-01 DIAGNOSIS — R066 Hiccough: Secondary | ICD-10-CM

## 2010-07-01 DIAGNOSIS — E538 Deficiency of other specified B group vitamins: Secondary | ICD-10-CM

## 2010-07-01 DIAGNOSIS — IMO0001 Reserved for inherently not codable concepts without codable children: Secondary | ICD-10-CM

## 2010-07-01 DIAGNOSIS — R509 Fever, unspecified: Secondary | ICD-10-CM

## 2010-07-01 LAB — CBC WITH DIFFERENTIAL/PLATELET
Basophils Absolute: 0 10*3/uL (ref 0.0–0.1)
Eosinophils Relative: 4.1 % (ref 0.0–5.0)
HCT: 39.6 % (ref 36.0–46.0)
Lymphs Abs: 1.4 10*3/uL (ref 0.7–4.0)
MCV: 97.2 fl (ref 78.0–100.0)
Monocytes Absolute: 0.4 10*3/uL (ref 0.1–1.0)
Platelets: 158 10*3/uL (ref 150.0–400.0)
RDW: 13.3 % (ref 11.5–14.6)

## 2010-07-01 LAB — BASIC METABOLIC PANEL
BUN: 23 mg/dL (ref 6–23)
CO2: 28 mEq/L (ref 19–32)
Chloride: 101 mEq/L (ref 96–112)
Glucose, Bld: 91 mg/dL (ref 70–99)
Potassium: 5.2 mEq/L — ABNORMAL HIGH (ref 3.5–5.1)

## 2010-07-01 LAB — URINALYSIS, ROUTINE W REFLEX MICROSCOPIC
Bilirubin Urine: NEGATIVE
Leukocytes, UA: NEGATIVE
Nitrite: NEGATIVE
Urobilinogen, UA: 0.2 (ref 0.0–1.0)
pH: 5.5 (ref 5.0–8.0)

## 2010-07-01 LAB — HEPATIC FUNCTION PANEL
ALT: 14 U/L (ref 0–35)
Total Bilirubin: 0.5 mg/dL (ref 0.3–1.2)

## 2010-07-01 LAB — TSH: TSH: 1.32 u[IU]/mL (ref 0.35–5.50)

## 2010-07-15 ENCOUNTER — Other Ambulatory Visit: Payer: Self-pay | Admitting: Oncology

## 2010-07-15 ENCOUNTER — Encounter (HOSPITAL_BASED_OUTPATIENT_CLINIC_OR_DEPARTMENT_OTHER): Payer: Medicare Other | Admitting: Oncology

## 2010-07-15 DIAGNOSIS — M069 Rheumatoid arthritis, unspecified: Secondary | ICD-10-CM

## 2010-07-15 DIAGNOSIS — Z86718 Personal history of other venous thrombosis and embolism: Secondary | ICD-10-CM

## 2010-07-15 DIAGNOSIS — Z7901 Long term (current) use of anticoagulants: Secondary | ICD-10-CM

## 2010-07-15 LAB — COMPREHENSIVE METABOLIC PANEL
Albumin: 3.4 g/dL — ABNORMAL LOW (ref 3.5–5.2)
Alkaline Phosphatase: 47 U/L (ref 39–117)
CO2: 28 mEq/L (ref 19–32)
Calcium: 9 mg/dL (ref 8.4–10.5)
Chloride: 106 mEq/L (ref 96–112)
Glucose, Bld: 93 mg/dL (ref 70–99)
Potassium: 5.3 mEq/L (ref 3.5–5.3)
Sodium: 141 mEq/L (ref 135–145)
Total Protein: 6.6 g/dL (ref 6.0–8.3)

## 2010-07-15 LAB — CBC WITH DIFFERENTIAL/PLATELET
Basophils Absolute: 0 10*3/uL (ref 0.0–0.1)
Eosinophils Absolute: 0.1 10*3/uL (ref 0.0–0.5)
HGB: 12.7 g/dL (ref 11.6–15.9)
MONO#: 0.3 10*3/uL (ref 0.1–0.9)
NEUT#: 1.8 10*3/uL (ref 1.5–6.5)
RBC: 3.85 10*6/uL (ref 3.70–5.45)
RDW: 13.6 % (ref 11.2–14.5)
lymph#: 1.4 10*3/uL (ref 0.9–3.3)

## 2010-07-15 NOTE — Assessment & Plan Note (Signed)
Summary: 2 MO ROV  STC   Vital Signs:  Patient profile:   76 year old female Height:      62 inches (157.48 cm) Weight:      154 pounds (70 kg) BMI:     28.27 O2 Sat:      98 % on Room air Temp:     98.0 degrees F (36.67 degrees C) oral Pulse rate:   74 / minute Resp:     14 per minute BP sitting:   138 / 80  (left arm) Cuff size:   regular  Vitals Entered By: Burnard Leigh CMA(AAMA) (July 01, 2010 7:52 AM)  O2 Flow:  Room air CC: Routine F/U.Pt needs to discuss labs/sls,cma Is Patient Diabetic? No Comments Pt needs Rx refills for: Oxycontin, Vagifem, Align, Nexium. Pt states she is no longer taking Protonix. Pt is requesting a referral for her daughter, Recardo Evangelist, to Dr. Jeral Fruit @ [phone]   Primary Care Provider:  Sonda Primes, MD  CC:  Routine F/U.Pt needs to discuss labs/sls and cma.  History of Present Illness: C/o fever, nausea x 2d The patient presents for a follow up of back pain, anxiety, depression and PE  Current Medications (verified): 1)  Oxycontin 10 Mg  Tb12 (Oxycodone Hcl) .Marland Kitchen.. 1 By Mouth Twice A Day For Pain. Please,  Fill On or After  06/06/10          . Ok To Fill 1 Day Earlier When The Month Has 31 Days in It. 2)  Protonix 40 Mg  Tbec (Pantoprazole Sodium) .... Take One Tab Once Daily 3)  Enbrel 50 Mg/ml  Soln (Etanercept) .Marland Kitchen.. 1 Inj Q 1 Wk Sq 4)  Vitamin D3 10000 Unit Caps (Cholecalciferol) .... Once Daily 5)  Levsin/sl 0.125 Mg Subl (Hyoscyamine Sulfate) .Marland Kitchen.. 1-2 Sl or By Mouth Qid Prn 6)  Lovenox 120 Mg/0.85ml Soln (Enoxaparin Sodium) .Marland Kitchen.. 1 Injection Daily 7)  Ropinirole Hcl 1 Mg Tabs (Ropinirole Hcl) .... Take 1 By Mouth 3 Times A Day 8)  Aleve 220 Mg Caps (Naproxen Sodium) .... Take 1 Twice Daily X 7 Days Then As Needed For Pain 9)  Vagifem 25 Mcg Tabs (Estradiol) .Marland Kitchen.. 1 Pv Q 2 D 10)  Atrovent 0.06 % Soln (Ipratropium Bromide) .Marland Kitchen.. 1-2 Sprays in Each Nostril As Needed For Runny Nose 11)  Sm Clotrimazole Vaginal 1 % Crea (Clotrimazole)  .... As Dirrected 12)  Vit B12 Inject.  0.23ml .Marland Kitchen.. 1 Shot A Week 13)  Ferro-Bob 325 (65 Fe) Mg Tabs (Ferrous Sulfate) .Marland Kitchen.. 1 By Mouth Once Daily 14)  Miralax   Powd (Polyethylene Glycol 3350) .Marland Kitchen.. 1 Capful Per Day 15)  Align  Caps (Probiotic Product) .... Take 1 Tablet By Mouth Once A Day 16)  Nexium 40 Mg Cpdr (Esomeprazole Magnesium) .... Take 1 Tablet By Mouth Two Times A Day  Allergies (verified): 1)  ! Xanax 2)  ! Neurontin 3)  ! Effexor 4)  ! Pcn 5)  ! Prednisone 6)  ! * Klonopin 7)  ! Vioxx 8)  ! Sulfa 9)  ! * Volaren 10)  ! * Benzodizzapines 11)  Mirapex (Pramipexole Dihydrochloride) 12)  Cymbalta (Duloxetine Hcl) 13)  Nortriptyline Hcl (Nortriptyline Hcl)  Past History:  Social History: Last updated: 10/05/2008 Retired Single Never Smoked Alcohol use-no Illicit Drug Use - no  Past Medical History: FMS Pulmonary embolism, hx of  2002 Rheumatoid arthritis    Dr Titus Dubin B12 def Vit D def Osteoarthritis Osteopenia H/o adrenal insuff IBS Tammy  Worell NP writes her thyroid meds  for "energy" blood clot below the knee 2008  Review of Systems       The patient complains of fever.  The patient denies syncope, dyspnea on exertion, and abdominal pain.         No cough and ST  Physical Exam  General:  NAD, overweight-appearing.   Nose:  Erythematous throat mucosa and intranasal erythema.  Mouth:  Erythematous throat mucosa and intranasal erythema. No thrush Lungs:  Clear throughout to auscultation. Coughing hard Heart:  Regular rate and rhythm; no murmurs, rubs,  or bruits. Abdomen:  NT Msk:  Symmetrical with no gross deformities. Normal posture. Lumbar-sacral spine is tender to palpation over paraspinal muscles and painfull with the ROM  Neurologic:  Alert and  oriented x4;  grossly normal neurologically. Skin:  Hemorrhagic excoriations present on upper and lower extr B cheeks w/erythema Psych:  Oriented X3.  Less sad. Not suicidal and depressed affect.      Impression & Recommendations:  Problem # 1:  HICCUPS (ICD-786.8) ? etiol Assessment New  Orders: TLB-BMP (Basic Metabolic Panel-BMET) (80048-METABOL) TLB-CBC Platelet - w/Differential (85025-CBCD) TLB-Hepatic/Liver Function Pnl (80076-HEPATIC) TLB-TSH (Thyroid Stimulating Hormone) (84443-TSH) TLB-Udip ONLY (81003-UDIP)  Problem # 2:  FEVER UNSPECIFIED (ICD-780.60) Assessment: New  Orders: TLB-BMP (Basic Metabolic Panel-BMET) (80048-METABOL) TLB-CBC Platelet - w/Differential (85025-CBCD) TLB-Hepatic/Liver Function Pnl (80076-HEPATIC) TLB-TSH (Thyroid Stimulating Hormone) (84443-TSH) TLB-Udip ONLY (81003-UDIP)  Problem # 3:  PULMONARY EMBOLISM (ICD-415.19) Assessment: Comment Only  Her updated medication list for this problem includes:    Lovenox 120 Mg/0.45ml Soln (Enoxaparin sodium) .Marland Kitchen... 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 1 by mouth twice a day for pain. please,  fill on or after  08/05/10          . ok to fill 1 day earlier when the month has 31 days in it. 2)  Enbrel 50 Mg/ml Soln (Etanercept) .Marland Kitchen.. 1 inj q 1 wk sq 3)  Vitamin D3 10000 Unit Caps (Cholecalciferol) .... Once daily 4)  Levsin/sl 0.125 Mg Subl (Hyoscyamine sulfate) .Marland Kitchen.. 1-2 sl or by mouth qid prn 5)  Lovenox 120 Mg/0.68ml Soln (Enoxaparin sodium) .Marland Kitchen.. 1 injection daily 6)  Ropinirole Hcl 1 Mg Tabs (Ropinirole hcl) .... Take 1 by mouth 3 times a day 7)  Aleve 220 Mg Caps (Naproxen sodium) .... Take 1 twice daily x 7 days then as needed for pain 8)  Vagifem 25 Mcg Tabs (Estradiol) .Marland Kitchen.. 1 pv q 2 d 9)  Atrovent 0.06 % Soln (Ipratropium bromide) .Marland Kitchen..  1-2 sprays in each nostril as needed for runny nose 10)  Sm Clotrimazole Vaginal 1 % Crea (Clotrimazole) .... As dirrected 11)  Vit B12 Inject. 0.34ml  .Marland Kitchen.. 1 shot a week 12)  Ferro-bob 325 (65 Fe) Mg Tabs (Ferrous sulfate) .Marland Kitchen.. 1 by mouth once daily 13)  Miralax Powd (Polyethylene glycol 3350) .Marland Kitchen.. 1 capful per day 14)  Align Caps (Probiotic product) .... Take 1 tablet by mouth once a day 15)  Nexium 40 Mg Cpdr (Esomeprazole magnesium) .... Take 1 tablet by mouth two times a day  Patient Instructions: 1)  Please schedule a follow-up  appointment in 2 months 2)  Call if you are not better in a reasonable amount of time or if worse. Go to ER if feeling really bad!  Prescriptions: NEXIUM 40 MG CPDR (ESOMEPRAZOLE MAGNESIUM) Take 1 tablet by mouth two times a day  #60 x 11   Entered and Authorized by:   Tresa Garter MD   Signed by:   Tresa Garter MD on 07/01/2010   Method used:   Print then Give to Patient   RxID:   [id number] ALIGN  CAPS (PROBIOTIC PRODUCT) Take 1 tablet by mouth once a day  #30 x 11   Entered and Authorized by:   Tresa Garter MD   Signed by:   Tresa Garter MD on 07/01/2010   Method used:   Print then Give to Patient   RxID:   [id number] VAGIFEM 25 MCG TABS (ESTRADIOL) 1 pv q 2 d  #15 x 12   Entered and Authorized by:   Tresa Garter MD   Signed by:   Tresa Garter MD on 07/01/2010   Method used:   Print then Give to Patient   RxID:   [id number] OXYCONTIN 10 MG  TB12 (OXYCODONE HCL) 1 by mouth twice a day for pain. Please,  fill on or after  08/05/10          . OK to fill 1 day earlier when the month has 31 days in it.  #60 x 0   Entered and Authorized by:   Tresa Garter MD   Signed by:   Tresa Garter MD on 07/01/2010   Method used:   Print then Give to Patient   RxID:   [id number] OXYCONTIN 10 MG  TB12 (OXYCODONE HCL) 1 by mouth twice a day for pain. Please,  fill on or after  07/05/10          . OK  to fill 1 day earlier when the month has 31 days in it.  #60 x 0   Entered and Authorized by:   Tresa Garter MD   Signed by:   Tresa Garter MD on 07/01/2010   Method used:   Print then Give to Patient   RxID:   [id number]    Orders Added: 1)  Est. Patient Level IV [51884] 2)  TLB-BMP (Basic Metabolic Panel-BMET) [80048-METABOL] 3)  TLB-CBC Platelet - w/Differential [85025-CBCD] 4)  TLB-Hepatic/Liver Function Pnl [80076-HEPATIC] 5)  TLB-TSH (Thyroid Stimulating Hormone) [84443-TSH] 6)  TLB-Udip ONLY [81003-UDIP]

## 2010-07-16 LAB — VITAMIN D 25 HYDROXY (VIT D DEFICIENCY, FRACTURES): Vit D, 25-Hydroxy: 30 ng/mL (ref 30–89)

## 2010-07-16 LAB — HEPARIN LEVEL (UNFRACTIONATED): Heparin Unfractionated: 1.4 IU/mL — ABNORMAL HIGH (ref 0.3–0.7)

## 2010-07-22 ENCOUNTER — Other Ambulatory Visit: Payer: Self-pay | Admitting: Oncology

## 2010-07-22 ENCOUNTER — Encounter (HOSPITAL_BASED_OUTPATIENT_CLINIC_OR_DEPARTMENT_OTHER): Payer: Medicare Other | Admitting: Oncology

## 2010-07-22 DIAGNOSIS — Z7901 Long term (current) use of anticoagulants: Secondary | ICD-10-CM

## 2010-07-22 DIAGNOSIS — Z1231 Encounter for screening mammogram for malignant neoplasm of breast: Secondary | ICD-10-CM

## 2010-07-22 LAB — CBC WITH DIFFERENTIAL/PLATELET
BASO%: 0.5 % (ref 0.0–2.0)
EOS%: 2.2 % (ref 0.0–7.0)
HCT: 40.4 % (ref 34.8–46.6)
MCH: 32.8 pg (ref 25.1–34.0)
MCHC: 34.3 g/dL (ref 31.5–36.0)
NEUT%: 53.6 % (ref 38.4–76.8)
RBC: 4.23 10*6/uL (ref 3.70–5.45)
lymph#: 1.7 10*3/uL (ref 0.9–3.3)

## 2010-07-22 LAB — HEPARIN ANTI-XA: Heparin LMW: 0.5 IU/mL

## 2010-09-08 ENCOUNTER — Encounter: Payer: Self-pay | Admitting: Internal Medicine

## 2010-09-08 ENCOUNTER — Ambulatory Visit (INDEPENDENT_AMBULATORY_CARE_PROVIDER_SITE_OTHER): Payer: Medicare Other | Admitting: Internal Medicine

## 2010-09-08 DIAGNOSIS — E538 Deficiency of other specified B group vitamins: Secondary | ICD-10-CM

## 2010-09-08 DIAGNOSIS — R42 Dizziness and giddiness: Secondary | ICD-10-CM

## 2010-09-08 DIAGNOSIS — G2581 Restless legs syndrome: Secondary | ICD-10-CM

## 2010-09-08 DIAGNOSIS — M1990 Unspecified osteoarthritis, unspecified site: Secondary | ICD-10-CM

## 2010-09-08 DIAGNOSIS — IMO0001 Reserved for inherently not codable concepts without codable children: Secondary | ICD-10-CM

## 2010-09-08 MED ORDER — OXYCODONE HCL 10 MG PO TB12: 10.0000 mg | ORAL_TABLET | Freq: Two times a day (BID) | ORAL | Status: DC

## 2010-09-08 MED ORDER — OXYCODONE HCL 10 MG PO TB12: 10.0000 mg | ORAL_TABLET | Freq: Every day | ORAL | Status: DC | PRN

## 2010-09-08 MED ORDER — MECLIZINE HCL 12.5 MG PO TABS: 12.5000 mg | ORAL_TABLET | Freq: Three times a day (TID) | ORAL | Status: AC | PRN

## 2010-09-08 NOTE — Progress Notes (Signed)
  Subjective:    Patient ID: Diane Abbott, female    DOB: [birth year], 76 y.o.   MRN: [id number]  HPI   The patient is here to follow up on chronic depression, anxiety, headaches and chronic moderate leg pain pains, fibromyalgia symptoms controlled with medicines, diet and exercise. C/o restless legs being worse...    Review of Systems  Constitutional: Negative for chills and fatigue.  HENT: Negative for nosebleeds and sneezing.   Eyes: Negative for pain.  Respiratory: Negative for cough.   Cardiovascular: Negative for leg swelling.  Genitourinary: Negative for flank pain.  Musculoskeletal: Negative for back pain and gait problem.  Skin: Negative for color change and rash.  Neurological: Positive for numbness. Negative for tremors and weakness.  Psychiatric/Behavioral: Negative for self-injury.       Objective:   Physical Exam  Constitutional: She appears well-developed and well-nourished. No distress.  HENT:  Head: Normocephalic.  Right Ear: External ear normal.  Left Ear: External ear normal.  Nose: Nose normal.  Mouth/Throat: Oropharynx is clear and moist.  Eyes: Conjunctivae are normal. Pupils are equal, round, and reactive to light. Right eye exhibits no discharge. Left eye exhibits no discharge.  Neck: Normal range of motion. Neck supple. No JVD present. No tracheal deviation present. No thyromegaly present.  Cardiovascular: Normal rate, regular rhythm and normal heart sounds.   Pulmonary/Chest: No stridor. No respiratory distress. She has no wheezes.  Abdominal: Soft. Bowel sounds are normal. She exhibits no distension and no mass. There is no tenderness. There is no rebound and no guarding.  Musculoskeletal: She exhibits tenderness (B legs are tender). She exhibits no edema.  Lymphadenopathy:    She has no cervical adenopathy.  Neurological: She displays normal reflexes. No cranial nerve deficit. She exhibits normal muscle tone. Coordination normal.  Skin: No rash  noted. No erythema.  Psychiatric: She has a normal mood and affect. Her behavior is normal. Judgment and thought content normal.          Assessment & Plan:  RESTLESS LEG SYNDROME Not better. Cont Rx  OSTEOARTHRITIS Not better. On Rx  FIBROMYALGIA Rx given  B12 DEFICIENCY On Rx  VERTIGO On rx - meds refilled.

## 2010-09-08 NOTE — Assessment & Plan Note (Signed)
Rx given

## 2010-09-08 NOTE — Assessment & Plan Note (Signed)
Not better. Cont Rx

## 2010-09-08 NOTE — Assessment & Plan Note (Signed)
On rx - meds refilled 

## 2010-09-08 NOTE — Assessment & Plan Note (Signed)
On Rx 

## 2010-09-08 NOTE — Assessment & Plan Note (Signed)
Not better. On Rx

## 2010-09-15 ENCOUNTER — Ambulatory Visit: Payer: Medicare Other

## 2010-09-16 NOTE — Discharge Summary (Signed)
Diane Abbott, BOWE NO.:  [id number]   MEDICAL RECORD NO.:  [id number]          PATIENT TYPE:  INP   LOCATION:  1321                         FACILITY:  [facility]   PHYSICIAN:  Pierce Crane, M.D.      DATE OF BIRTH:  Dec 12, 1934   DATE OF ADMISSION:  09/28/2006  DATE OF DISCHARGE:  10/04/2006                               DISCHARGE SUMMARY   DISCHARGE DIAGNOSES:  1. Rheumatoid arthritis.  2. History of left hand pain.   HISTORY:  This 76 year old woman is well known to our practice with  history of chronic anemia, is on chronic anticoagulation for history of  DVT and PE.  She presented with INR of 10.2 and easy bruising and some  bleeding.  She was admitted by Dr. Arline Asp.  Initial ev showed her to  be stable. Saturation of 98%.  Vital signs were stable.  She did not  have any obvious palpable peripheral adenopathy.  Lungs are clear.  Heart sounds are normal.  She had leg swelling and dorsum of the hand.  There is some ecchymosis of the upper extremity.  As noted, her INR was  10.  Hemoglobin was 10.4, white count 4.8.   COURSE IN THE HOSPITAL:  The patient was admitted to the hospital. Prior  to that she was given FFP and vitamin K.  INR did quickly normalize.  She was seen.  It was unclear as to what the etiology of the swelling in  her hand was.  She was seen by Dr. Teressa Senter from orthopedics who felt this  was a carpal tunnel syndrome related to acute bleeding.  She did have an  MRI of the hand.  This showed some fluid.  No obvious hematoma.  Changes  were seen in the tendon sheath and the bones and fluid in tendon sheath.  Ultimately she was restarted on low dose Coumadin.  She was felt to be  stable and discharged on 6/2.  Pain was relieved with Percocet.  On day  of discharge INR was 1.3.  She was restarted on low dose Coumadin,  instructed to follow-up in the inpatient clinic.  She was also given  some Lovenox injections for coverage until her INR became  therapeutic.  She was discharged from the hospital in stable condition on that day.      Pierce Crane, M.D.  Electronically Signed     PR/MEDQ  D:  10/28/2006  T:  10/28/2006  Job:  [id number]

## 2010-09-16 NOTE — Assessment & Plan Note (Signed)
[facility]                         GASTROENTEROLOGY OFFICE NOTE   Diane Abbott, Diane Abbott                      MRN:          [id number]  DATE:02/16/2007                            DOB:          1934/07/28    [name] is a 76 year old white female with a history of irritable  bowel syndrome/functional constipation.  She has also multiple other  health issues including fibromyalgia, deep venous thrombosis,  depression, connective tissue disorder, specifically rheumatoid  arthritis.  We have done GI evaluations on her in the past including  last colonoscopy in December 2001.  She is due for next colonoscopy  December 2011.  We have evaluated her gastroesophageal reflux disease,  last endoscopy 2001.  She is currently asymptomatic after  cholecystectomy in 2003.  She takes occasional Pepcid on a p.r.n. basis.  She is here today just to check about her constipation.  She takes an  over-the-counter laxative; she prefers pills from powder or liquid,  which is a quite limited choice; she usually takes Senokot-S 1 or 2 at  day x3.  Her bowel movements occur 2-3 times a week.  She denies any  rectal bleeding.   PHYSICAL EXAMINATION:  Blood pressure 102/72, pulse 76, weight 157  pounds.  The patient was obviously depressed; her daughter died several months  ago.  LUNGS:  Clear to auscultation.  COR:  Normal S1 and normal S2.  ABDOMEN:  Soft, but very tender in epigastrium.  Lower abdomen was  normal.  There are normoactive bowel sounds.  Abdominoplasty scar was  well-healed.  EXTREMITIES:  No edema.   IMPRESSION:  1. [age]-year-old white female with functional gastrointestinal      problems, irritable bowel syndrome/constipation.  2. Depression.  3. Gastroesophageal reflux, currently controlled.   PLAN:  1. Senokot-S two or three a day; other choices would be Dulcolax      tablets or possibly Colace.  2. High-fiber diet.  3. Increase her exercise.  4. Colonoscopy recall December 2011.     Diane Abbott. Diane Chance, MD  Electronically Signed    DMB/MedQ  DD: 02/16/2007  DT: 02/17/2007  Job #: [id number]   cc:   Diane Quint. Plotnikov, MD

## 2010-09-16 NOTE — Op Note (Signed)
NAMEDONELL, [name] NO.:  [id number]   MEDICAL RECORD NO.:  [id number]          PATIENT TYPE:  AMB   LOCATION:  DSC                          FACILITY:  [facility]   PHYSICIAN:  Diane Abbott, M.D. DATE OF BIRTH:  24-Feb-1935   DATE OF PROCEDURE:  10/26/2006  DATE OF DISCHARGE:                               OPERATIVE REPORT   PREOPERATIVE DIAGNOSES:  Status post probable tenosynovial bleed, left  volar wrist, leading to acute and severe carpal tunnel syndrome, with  evidence of recurrent aggressive rheumatoid tenosynovium at left wrist,  ulnar bursa, and left long finger synovial sheath, leading to a left  long finger flexion contracture.   POSTOPERATIVE DIAGNOSES:  Probable status post bleed into ulnar bursa  with aggressive recurrence of rheumatoid syndrome with a Mannerfelt-type  lesion on the volar aspect of the distal radioulnar joint with rupture  of the distal radioulnar joint capsule and extrusion of pathologic  synovium from the distal radioulnar joint.  There was a significant  fibrotic and inflammatory tenosynovitis of the ulnar bursa consistent  with recurrent rheumatoid arthritis causing severe carpal tunnel  syndrome.   OPERATION:  1. Exploration of left carpal canal and volar wrist mass, finding a      large tenosynovial mass and evidence of severe carpal tunnel      syndrome with significant contusion of median nerve at distal      margin of transverse carpal ligament.  2. Radical tenosynovectomy of ulnar bursa.  3. Synovectomy of distal radioulnar joint with repair of volar capsule      due to erosive synovitis.  4. Release of left long finger A1 pulley and incidental synovectomy.   OPERATIONS:  Diane Abbott, M.D.   ASSISTANT:  Diane Maduro Dasnoit PA-C.   ANESTHESIA:  General by LMA.  Supervising anesthesiologist is Dr.  Jacklynn Abbott.   INDICATIONS:  Diane Abbott is a 76 year old woman who has been a long-  term patient of our practice.   She has had a very challenging spring of 2008.  On July 10, 2006, she  fell sustaining a minimally-displaced fracture of the left distal  radius.  She was treated with reduction and immobilization.  She went on  to heal with a near-anatomic result.  Approximately 2-1/2 months later  she was admitted to [facility] with profound gum  bleeding and swelling of the volar aspect of her wrist and left hand.  She was noted have a markedly elevated INR.  She is on chronic Coumadin  anticoagulation due to a hypocoagulable state.  She is under the care of  Dr. Donnie Abbott at the cancer center for a possible myelodysplastic state.   For reasons that are not at all, clear Diane Abbott developed a profound  anticoagulated state with an INR greater than 10.  She was admitted to  [facility] on Sep 28, 2006, and was given fresh  frozen plasma to correct her bleeding diathesis.  She was noted to have  a flexion posture of her fingers, pain and numbness in her hand, and an  urgent hand surgery  consult was requested.   On examination she appeared to have an acute bleed into her ulnar bursa.  I advised the oncology service to apply heat, elevate the hand, and to  withhold Coumadin for several days until the bleed stabilized.   After a short period of time Diane Abbott was more comfortable and had  developed some recovery of her median-innervated sensibility.  She  returned to our office for follow-up after discharge and was noted to  have persistent carpal tunnel syndrome.  We advised her to schedule  exploration of her carpal canal as she was developing a progressive mass  in the canal that initially looked like hematoma but as time evolved  began to look more like chronic synovitis.   At the time of her interview in the holding area, she was noted have  obvious synovitis of her wrist and distal radioulnar joint and a large  volar synovial mass, all consistent with an  aggressive inflammatory  synovitis or rheumatoid arthritis.   She has recently seen Diane Abbott, M.D., for rheumatologic  evaluation.   She is brought to the operating room at this time anticipating  synovectomy, release of her long finger flexor sheath, and biopsy of her  mass.   PROCEDURE:  Diane Abbott was brought to the operating room and placed  in the supine position on the operating table.   Following an anesthesia consult with Dr. Jacklynn Abbott, general anesthesia by  LMA was recommended.   Her preoperative INR was 1.   She was brought to room #1, placed in supine position on the operating  table, and under Dr. Marlane Abbott supervision general anesthesia by LMA  technique induced.  Her the left arm was prepped with Betadine soap and  solution and sterilely draped.  A pneumatic tourniquet was applied to  the proximal left brachium.   Following exsanguination of the left arm with Esmarch bandage, an  arterial tourniquet over the proximal brachium was inflated to 250 mmHg.  The procedure commenced with planning of an extended carpal tunnel  incision brought proximal to the wrist flexion creases to explore the  mass in the forearm.   The skin edges were taken sharply, followed by a electrocautery of the  subdermal plexus.  The distal margin of the transverse carpal ligament  was identified in the palm after release of the palmar fascia, followed  by sounding the carpal canal.  There was marked tenosynovium within the  carpal canal.   The transverse carpal ligament was released along its ulnar border  extending proximally, followed by release of the volar forearm fascia.  There was a bulging mass of tenosynovium deep to the superficialis  muscles.   The median nerve was dissected from the synovial mass and gently  protected with blunt retractors, followed by meticulous tenosynovectomy  removing the entire ulnar bursa and synovium of the carpal canal.  This  was passed  off in formalin for pathologic evaluation.  The feeding  vessels were electrocauterized with bipolar current.  Care was taken to  identify the ulnar artery and nerve.  These were gently retracted,  followed by exploration of the distal radioulnar joint and volar  capsule.  There was an 8 x 4 mm defect in the volar aspect of the distal  radioulnar joint capsule.  The triangular fibrocartilage was  inflammatory and degenerative.  There was an abundant tenosynovium  growth within the distal radioulnar joint that had the appearance of a  hyperplastic rheumatoid synovium.  A synovectomy  of the distal  radioulnar joint was accomplished with a fine rongeur, followed by  repair of the capsule of the distal radioulnar joint with a pair of  figure-of-eight sutures of 4-0 Vicryl.   Attention was then directed to the left long finger.   A transverse incision was fashioned in the distal palmar crease,  followed by exposure of the palmar fascia.  The thickened fascia was  released with scissors, followed by identification of the A1 pulley.  After protection of the neurovascular structures, the A1 pulley was  released with scalpel and scissors.  The proximal tenosynovial fold was  fibrotic and thickened.  A tenosynovectomy was performed proximal to the  A1 pulley to facilitate recovery of full extension of the long finger.   After the volar wrist and carpal tunnel synovectomy and the release of  the A1 pulley, full extension of the long finger was recovered.   The median nerve was inspected and found be quite hyperemic at the  distal margin of the transverse carpal ligament.  This appeared to be a  chronic compression syndrome due to the tenosynovitis.   It appears that Diane Abbott has had a series of issues including initially  the fracture, followed by the anticoagulation episode with hemorrhage  into the tenosynovium due to the evidence of hemosiderin deposits.  There is currently evidence of an  aggressive rheumatoid tenosynovitis.   She has seen Dr. Corliss Skains.   We will copy Dr. Corliss Skains with our operative note so that she  understands that we have found evidence of rheumatoid synovitis in the  distal radioulnar joint, a Mannerfelt lesion in the volar capsule of the  distal radioulnar joint, and marked synovitis in the ulnar bursa and  long finger flexor sheath.   It appears that Diane Abbott will need to begin more aggressive management  of her inflammatory arthritis.   The wounds were irrigated and subsequently repaired with subdermal  corner sutures of 4-0 Vicryl and intradermal segmental 3-0 Prolene.  The  tourniquet was released and hemostasis achieved by direct compression.  A voluminous hand and wrist dressing was applied, followed by a volar  plaster splint maintaining the wrist in 10 degrees of dorsiflexion.   There were no apparent complications.      Diane Fitch Abbott, M.D.  Electronically Signed     RVS/MEDQ  D:  10/26/2006  T:  10/27/2006  Job:  [id number]   cc:   Diane Abbott, M.D.

## 2010-09-16 NOTE — H&P (Signed)
NAMEKRISTLE, WESCH NO.:  [id number]   MEDICAL RECORD NO.:  [id number]          PATIENT TYPE:  INP   LOCATION:  0103                         FACILITY:  [facility]   PHYSICIAN:  Samul Dada, M.D.DATE OF BIRTH:  10/04/1934   DATE OF ADMISSION:  09/28/2006  DATE OF DISCHARGE:                              HISTORY & PHYSICAL   HISTORY:  Diane Abbott is a 76 year old white female, being admitted  through the emergency room for markedly elevated prothrombin time  greater than 90 seconds, with an INR of 10.2 and association with  bleeding from the gums (estimated to be about 1/2 cup by the patient and  her daughter Diane Abbott).  Also, suspected hematoma involving the left hand  and wrist, requiring IV Dilaudid for control of her pain.  The patient  apparently was in her stable state of health until this morning, when  she woke up with paresthesias in her left wrist.  She then became aware  of some swelling of the wrist, particularly of the left wrist, the volar  area and swelling on the dorsum of her left hand.  The hand has become  swollen.  The hand is extremely painful and was not controlled with  Percocet in the emergency room.  Subsequently is requiring Dilaudid IV,  given every 1-2 hours.   The patient has been on Coumadin 5 mg daily, since May 2003 when she  apparently developed DVT in her left leg, associated with a fall.  She  also had bilateral pulmonary emboli at that time.  She was initially  treated with Coumadin.  Apparently it was very difficult to control her  levels, and she was on Lovenox for a period of time until about 5 months  ago -- when once again she was placed on Coumadin.  It appears that her  prothrombin times were fairly stable on this dosage.  In fact, we have a  prothrombin time from Sep 14, 2006 of 28.8 seconds and with an INR of  2.4.   The patient had some dental work today.  She came to the emergency room  with a lot of blood in  her mouth.  Also complaining of pain in her left  wrist and hand.  She stated her prothrombin time was markedly elevated.  She is currently receiving her second bag of FFP.  Bleeding in her mouth  has stopped.   The patient and the emergency room doctor felt she needed to be  admitted.  I was called to admit her as the on-call physician for Dr.  Pierce Crane.   PAST MEDICAL HISTORY:  This is rather complicated, in that the patient  has had bilateral total knee arthroplasties by Dr. Lequita Halt.  She has  rheumatoid arthritis, osteoarthritis, fibromyalgia.  She had a  respiratory arrest following abdominal surgery approximately 10 years  ago.  She has GERD, a history of bronchitis, hemorrhoids, urinary tract  infection, restless leg syndrome, irritable bowel syndrome, neuropathy,  anemia (possibly due to myelodysplastic syndrome).  She has undergone  appendectomy, surgery on her right wrist, a hysterectomy with  bilateral  oophorectomy and cosmetic surgery.   ALLERGIES:  PENICILLIN, BENZODIAZEPINE, EFFEXOR, NEURONTIN, VIOXX.   MEDICATIONS:  1. Folic acid 1 mg daily.  2. Antivert as needed.  3. Protonix 40 mg twice a day.  4. Coumadin 5 mg daily.  5. Prednisone as needed, most recently 5 mg.  6. Ferrous sulfate 325 mg a day.  7. Enbrel 50 mg injections once a week.  8. Ipratropium nasal spray.  9. Meclizine for vertigo.  10.Topamax in the past for migraine headaches.   FAMILY HISTORY:  Father apparently had blood clots.  Mother died at age  56 with cancer.   SOCIAL HISTORY:  The patient denies any use of cigarettes or alcohol.  She was born and raised in [place]; has been in [place] since 1967.  She is divorced.  A daughter recently died.  Her daughter, Diane Abbott, is here with her and apparently living with her now.  The patient  lives in [place].  She drives.   REVIEW OF SYSTEMS:  The patient has had migraine headaches, sees Dr.  Sandria Manly.  No stroke or stroke-like  problems.  She wears glasses.  No eye  surgery.  She is having some hearing loss.  She has hay fever and sinus  problems.  She has irritable bowel syndrome.  No liver problems.  No  cardiac problems.  She has had bronchitis in the past.  She has yearly  mammograms.  She has had urinary tract infections.  She occasionally has  some hot flashes.  Does not have any postmenopausal bleeding.  No recent  history of swelling of the legs.  No pain in the legs when she walks.  She bruises easily.  She denies any excessive bleeding in the past.  She  has arthritis, as described above.  She has fever and night sweats.  She  denies any skin problems or psychological problems.   PHYSICAL EXAMINATION:  GENERAL:  The patient is pleasant, fairly good  historian.  She does not seem to be in undue pain, unless her left hand  and wrist are moved.  VITAL SIGNS:  Respirations on room air 98%.  She is afebrile.  Pulse 67  and regular, respirations regular and unlabored.  HEENT:  No scleral icterus.  Pupils and extraocular movements are  normal.  Mouth and pharynx with some dried blood, but no active  bleeding.  NECK:  Without adenopathy, thyroid enlargement or bruit.  LUNGS:  Clear to percussion and auscultation.  CARDIAC:  Regular rhythm, without murmur or rub.  BREASTS:  Not examined.  LYMPHATIC:  No axillary or inguinal adenopathy.  ABDOMEN:  Soft, nontender; with no organomegaly or masses palpable.  EXTREMITIES:  No peripheral edema or clubbing. The patient has fairly  extensive changes in her toes and fingers of rheumatoid arthritis.   Attention to her left upper extremity shows swelling of her left hand.  There is a cystic-like swelling on the dorsum of the hand, that measures  approximately 4-5 cm.  No ecchymosis.  There is also swelling on the  volar aspect of the wrist, approximately 3 cm.  These areas appear to be tender, almost cystic, fairly tense.  There are purpura and ecchymoses  over the  upper extremity.  No active bleeding.  NEUROLOGIC:  Grossly normal, without any focal findings.   LABORATORY DATA:  Prothrombin time greater than 90 seconds, INR 10.2.  White count 4.8, hemoglobin 10.4, hematocrit 30.3, platelets 250,000.  PTT is pending.  I do  not see any chemistries at the moment.   RADIOLOGIC TESTING:  X-rays of the left hand show findings consistent  with rheumatoid arthritis involving the MCP joints and carpus, with  swelling.  Consistent with synovitis.  There is widening of the  scapholunate interval, consistent with tear.   IMPRESSION AND PLAN:  1. PROLONGED PROTHROMBIN TIME OF UNCERTAIN ETIOLOGY.  This is      puzzling, given the lack of interfering medications or other      substances.  As stated, the patient did take some prednisone 5 mg      within the past day or two.  There is question of whether the      patient could have taken more Coumadin or perhaps there has been a      pharmacy error accounting for this patient's prolonged prothrombin      time.  An alternative explanation would be coagulopathy, such as      development of a factor inhibitor.  We are checking PTT.  This may      require further workup.  2. PAIN AND SWELLING INVOLVING THE LEFT WRIST.  This may require      orthopedic evaluation.  The patient has seen Dr. Lequita Halt in the      past; also Dr. Kathryne Hitch as a rheumatologist.  3.  The      patient has anemia, possibly myelodysplastic syndrome.   Other problems are as enumerated above.      Samul Dada, M.D.  Electronically Signed     DSM/MEDQ  D:  09/29/2006  T:  09/29/2006  Job:  [id number]   cc:   Pierce Crane, M.D.  Fax: [phone]

## 2010-09-16 NOTE — Consult Note (Signed)
[name], Abbott NO.:  [id number]   MEDICAL RECORD NO.:  [id number]          PATIENT TYPE:  INP   LOCATION:  1321                         FACILITY:  [facility]   PHYSICIAN:  Katy Fitch. Sypher, M.D. DATE OF BIRTH:  1934-06-11   DATE OF CONSULTATION:  09/29/2006  DATE OF DISCHARGE:                                 CONSULTATION   REQUESTING PHYSICIAN:  Pierce Crane, M.D., Oncology Service   CHIEF COMPLAINT:  Painful, swollen, numb left hand and wrist.   HISTORY OF PRESENT ILLNESS:  Diane Abbott. Shimamoto is a 76 year old right-  hand dominant woman who was admitted on Sep 28, 2006 by Dr. Arline Asp for  evaluation and management of a severe coagulopathy.   Ms. Duggin has a history of deep venous thrombosis and bilateral  pulmonary embolus treated in 2003.  She has been managed with both  Lovenox and oral Coumadin.   She has been reasonably stable on her oral Coumadin dose; however,  recently she had dental work and had prolonged bleeding from her gums  and from her mouth.  She was seen in the emergency room, where she had  lab studies which revealed a markedly elevated INR of greater than 10  with a prothrombin time of greater than 90 seconds.  She was admitted  for fresh frozen plasma.   During her admission, she noted significant pain and swelling in the  left wrist.  She has been seen by Dr. Despina Hick for bilateral knee  arthroplasty and Dr. Corliss Skains for rheumatology consult.  I have treated  her recently for a wrist fracture.  She is well-acquainted with our  practice, as I cared for her daughter for many years for a number of  orthopedic conditions.   Dr. Donnie Coffin has requested orthopedic consultation be performed at this  time.   PAST MEDICAL HISTORY:  As per record.   ALLERGIES:  PENICILLIN, BENZODIAZEPINES, EFFEXOR, NEURONTIN AND VIOXX.   MEDICATIONS:  1. Folic acid 1 mg daily.  2. Antivert p.r.n.  3. Protonix 40 mg b.i.d.  4. Coumadin 5 mg daily, currently  being held.  5. Prednisone 5 mg daily p.r.n.  6. Iron supplement.  7. Enbrel 50 mg injections once weekly.  8. Ipratropium nasal spray.  9. She takes meclizine p.r.n. vertigo.  10.Topamax p.r.n. migraine headaches.   She has a positive family history of deep venous thrombosis and a  history of cancer.  She recently lost her daughter due to what sounds  like pulmonary emboli.  Her daughter has had a number of other chronic  medical problems, including spinal degenerative disk disease and morbid  obesity.   PHYSICAL EXAMINATION:  VITAL SIGNS:  Blood pressure 117/57, temperature  97.7, pulse 80, respirations 20.  GENERAL:  Her gait and stance were not tested.  EXTREMITIES:  Inspection of her upper extremities reveals a normal-  appearing right hand.  Inspection of the left hand and wrist reveal  swelling of the volar aspect of the wrist with a firm hematoma over the  ulna bursa.  She had a mild swelling of the wrist.  She had  flexed  posture of her left long finger with a 30 degree flexion contracture.  Passive extension was painless in her fingers and wrist.  She had full  flexion of her fingers and thumb.  she could palmar flex her wrist 40  degrees and demonstrated dorsiflexion of the wrist at 40 degrees.  She  was noted to have 2+ radial pulse and 1+ ulnar pulse.  Her capillary  refill was two seconds in her fingers and thumb.  Her median sensibility  was diminished on the left, intact on the right.  She had intact radial  and ulnar sensibility.  Her motor function was intact.   ASSESSMENT:  Acute left ulnar bursal hematoma with secondary carpal  tunnel syndrome and pain.   The flexed posture of her long finger represents a probable entrapment  due to the tension of the hematoma in the ulnar bursa.  She does not  have any focal signs of intrinsic or extrinsic compartment syndrome at  this time.   PLAN:  I have advised Ms. Pelissier to utilize the heating pad and elevation  of her  left hand.  Should she not show signs of resolution of her median  neuropathy  symptoms, she may require an acute carpal tunnel release.  I  feel it would be advantageous to maintain her INR  at less than 1.5 in  the interim so that surgery could be accomplished on an acute basis.      Katy Fitch Sypher, M.D.  Electronically Signed     RVS/MEDQ  D:  09/29/2006  T:  09/30/2006  Job:  [id number]

## 2010-09-16 NOTE — Discharge Summary (Signed)
NAMEKALESE, ENSZ NO.:  [id number]   MEDICAL RECORD NO.:  [id number]          PATIENT TYPE:  INP   LOCATION:  1432                         FACILITY:  [facility]   PHYSICIAN:  Rosalyn Gess. Norins, MD  DATE OF BIRTH:  [birth year]   DATE OF ADMISSION:  01/09/2008  DATE OF DISCHARGE:  01/12/2008                               DISCHARGE SUMMARY   ADMITTING DIAGNOSIS:  Left lower extremity deep venous thrombosis.   DISCHARGE DIAGNOSIS:  Left lower extremity deep venous thrombosis.   CONSULTANTS:  Dr. Pierce Crane for hematology.   PROCEDURE:  A CT of the chest with PE protocol that was negative for  pulmonary embolus.   HISTORY OF PRESENT ILLNESS:  The patient is a 76 year old woman followed  for multiple medical problems, including rheumatoid arthritis,  fibromyalgia, osteopenia.  She does have a history of pulmonary embolism  in 2002.  She did have a problem with Coumadin with excessive bleeding,  thrombocytopenia, and also had a problem with a bleed into her wrist.   The patient presented to the office on the day of admission complaining  of a 3-day history of left lower extremity swelling and aching pain  rated as an 8/10, worse with walking.  The patient has been off Coumadin  for approximately a year.  The patient's leg was enlarged.  She was sent  for lower extremity venous Doppler, which did show she had 2 very large  DVTs in the left lower extremity.  She was admitted for anticoagulation.   Please see the H&P on EMR for past medical history, family history,  social history, and physical exam at admission.   HOSPITAL COURSE:  The patient was admitted to the hospital.  She was  started on full-dose Lovenox and was to be started on Coumadin.  Because  of the problem with anticoagulation in the past, Dr. Donnie Coffin was  consulted.  The patient is also followed by hematology in [facility].  Dr. Renelda Loma recommendation was the patient should be treated with low-  molecular-weight heparin only, no Coumadin, and he did not think she  needed an IVC filter.   The patient has had ongoing pain in her left leg, particularly with  weightbearing.  She has been getting her home regimen plus Dilaudid 2 to  4 mg IV q.4 h.  This has kept her moderately comfortable.   With the patient being free of PE with her being bed bound for several  days, now ready for discharge home to continue on low-molecular- weight  heparin/Lovenox.   DISCHARGE EXAMINATION:  VITAL SIGNS:  Temperature 98.8, blood pressure  110/62, heart rate 79, respirations 20, O2 sats 98% on 2 liters.  GENERAL APPEARANCE:  This is a heavy-set woman lying in bed.  She is in  no acute distress.  HEENT:  Exam was grossly unremarkable.  CHEST:  Clear.  No wheezes or rales were noted.  There was no increased  work of breathing.  CARDIOVASCULAR:  The patient had 2+ radial pulses.  Her precordium was  quiet.  She had a regular rate and rhythm.  ABDOMEN:  Nontender.  EXTREMITIES:  The left lower extremity was mildly swollen and tender.   FINAL LABORATORY:  Hemoglobin was 10.8 grams, hematocrit was 32.2%,  white count 3,100, platelet count 145,000.  INR was 1.1.  The final  metabolic panel January 10, 2008, was unremarkable with potassium of  3.9, BUN of 19, creatinine 0.95.  Glucose was 111.  The patient did have  a D-dimer at time of admission that came back at greater than 20, but  she did rule out for PE by CT scan as noted.   DISCHARGE MEDICATIONS:  The patient will resume all of her home  medications, including OxyContin 10 mg b.i.d., Celexa 10 mg daily,  Protonix 40 mg daily, Levsin 4 times a day p.r.n., meclizine 12.5 mg  b.i.d., Enbrel 50 mcg injections every Wednesday IM, self-administered,  vitamin D daily, vitamin B12 orally on a daily basis.   New medications will include Lovenox at 120 mg IM q.24 h.  We will  continue her on her home pain medications.  We will add OxyIR 5 mg q.4 h   p.r.n. breakthrough pain providing her enough for 4 times a day dosing  for 10 days.  Her pain medication chronic regimen will be adjusted by  Dr. Posey Rea in outpatient followup.   DISPOSITION:  The patient is discharged home.  She does have supportive  family.  As noted, she will continue her home medications.  The patient  will need to call Dr. Renelda Loma office to set up followup.  She will be  scheduled to see Dr. Posey Rea in 5 to 7 days for office followup.   The patient's condition at time of discharge dictation is stable and  improved.      Rosalyn Gess Norins, MD  Electronically Signed     MEN/MEDQ  D:  01/12/2008  T:  01/12/2008  Job:  [id number]   cc:   Pierce Crane, MD  Fax: [phone]   Georgina Quint. Plotnikov, MD  [address]  [place]  [place] [address]

## 2010-09-19 NOTE — Discharge Summary (Signed)
NAME:  Diane Abbott, Diane Abbott                         ACCOUNT NO.:  [id number]   MEDICAL RECORD NO.:  [id number]                   PATIENT TYPE:  INP   LOCATION:  0474                                 FACILITY:  [facility]   PHYSICIAN:  Ollen Gross, M.D.                 DATE OF BIRTH:  [birth year]   DATE OF ADMISSION:  08/06/2003  DATE OF DISCHARGE:  08/10/2003                                 DISCHARGE SUMMARY   ADMISSION DIAGNOSES:  1. Osteoarthritis bilateral knees left more symptomatic than right.  2. Rheumatoid arthritis.  3. Fibromyalgia.  4. Significant history of respiratory arrest following previous abdominal     surgery 5 to 7 years ago.  5. Reflux disease.  6. History of bronchitis.  7. History of urinary retention.  8. History of deep vein thrombosis left leg approximately 3 years ago     following a fall.  9. History of bilateral pulmonary embolism from left leg deep vein     thrombosis approximately 3 years ago.   DISCHARGE DIAGNOSES:  1. Osteoarthritis left knee, valgus deformity, status post left total knee     arthroplasty.  2. Right knee arthritis.  3. Postoperative blood loss anemia.  4. Status post transfusion without sequelae.  5. Rheumatoid arthritis.  6. Fibromyalgia.  7. Significant history of respiratory arrest following previous abdominal     surgery 5 to 7 years ago.  8. Reflux disease.  9. History of bronchitis.  10.      History of urinary retention.  11.      History of deep vein thrombosis left leg approximately 3 years ago     following a fall.  12.      History of bilateral pulmonary embolism from left leg deep vein     thrombosis approximately 3 years ago.   PROCEDURE:  Date of surgery August 06, 2003 left total knee arthroplasty.  Surgeon:  Ollen Gross, M.D.  Assistant:  Alexzandrew L. Perkins, P.A.-C.  Anesthesia spinal with postoperative Marcaine pain pump.  Minimal blood  loss.  Hemovac drain x1.  Tourniquet time:  44 minutes at 300 mmHg.   CONSULTS:  Rehabilitation Services.   BRIEF HISTORY:  [name] is a 76 year old female with end-stage arthritis  of the left knee with approximately 30-degree valgus deformity.  She now  presents for a total knee arthroplasty.   LABORATORY DATA:  CBC on admission showed a hemoglobin of 11.7, hematocrit  of 35.5, white cell count 3.6, red cell count of 3.6.  Differential showed  decreased neutrophils of 27, elevated lymphs at 57, monos 9, eos elevated at  6, basos 1.  Postop H&H down to 8.8 and 26.1.  Hemoglobin continued to drop  down to 7.5, given blood.  Post transfusion hemoglobin 9.2.  Last noted H&H  9 and 26.8.  On the initial CBC, the blood smear cell did show some smudge  cells and atypical  lymphocytes with teardrop cells in the lymphocytes.  Large platelets were present.  Preop coag study showed a PT of 25.6, PTT of  57, INR of 3.4.  Rechecked on the morning of surgery at 13.3 and 34  respectively PT/PTT.  Serum pro times were followed per Coumadin protocol.  Last noted PT/INR 21.2 and 2.4.  Chem panel on admission all within normal  limits.  Serial BMETs were followed.  Sodium dropped from 143 down to 134  back up to 137.  Glucose went up from 90 to 157 back down to 104.  Calcium  dropped from 8.5 to 7.9.  Preop UA:  Trace blood, small bilirubin, trace  ketones, oxalate crystals noted, 0-2 white cells.  Blood group type A  positive.   EKG dated August 06, 2003 normal sinus rhythm, normal EKG.  This is  unconfirmed EKG.  Preop chest x-ray with mild osteopenia, stable anterior  superior compression fracture T12, no active disease in the chest.   HOSPITAL COURSE:  The patient was admitted to [facility], taken to  the OR and underwent the above-stated procedure without complication.  The  patient tolerated the procedure well, later sent to recovery, and then to  the orthopedic floor in continued postoperative care.  Vital signs were  followed.  The patient was placed on  p.o. and PCA analgesics for pain  control following surgery.  Was started back on home meds, placed on a  continuous pulse oximetry.  PT and OT and rehab were consulted postop to  assist with gait training ambulation and ADLs and consideration of inpatient  rehab.  She was placed weightbearing as tolerated to the left lower  extremity.  Hemovac drain placed at the time of surgery was pulled on postop  day #1.  She had a rough night through that first evening.  She had a  moderate amount of pain.  She was placed on continuous pulse oximetry  because of a previous history of DVT, PE, and respiratory arrest in the  past.  She had been increased to high-dose PCA because of the pain control.  By day #2, she was still in a fair amount of pain but doing a little bit  better, was still using the PCA.  She was up to the chair.  She did note she  had a drop in her blood down to 7.5.  She was given blood.  Posttransfusion  hemoglobin was back up to 9.2.  Dressing was changed.  Did have some bloody  drainage on the dressing but there was no active bleeding.  She started  getting up with physical therapy and was initially slow with her physical  therapy.  She only got up and walked 15 feet the first day and 20 feet by  day #2.  By day #3, she was doing much better.  She still had some soreness  but much improved.  She was also ambulating much better over 200 feet.  PCAs  were discontinued by then along with the Foley.  Her blood had improved up  to 9.2 following the transfusion.  She did so well with physical therapy was  felt that she would be able to go home.  By day #4, the patient was doing  much better with pain control and much more comfortable, moving her bowels,  tolerating her meds.  Arrangements were made for the patient to have  hospital bed and home therapy.  Once all the arrangements were made, it was  decided the patient could be discharged home.   DISCHARGE PLAN: 1. The patient  discharged home on August 10, 2003.  2. Discharge diagnoses please see above.  3. Discharge meds:  OxyContin, OxyIR, Robaxin, Coumadin.  4. Diet as tolerated.  5. Activity:  Weightbearing as tolerated, home health PT and home health     nursing.   DISPOSITION:  Home.   CONDITION ON DISCHARGE:  Improved.   FOLLOW UP:  Two weeks from surgery.     Alexzandrew L. Julien Girt, P.A.              Ollen Gross, M.D.    ALP/MEDQ  D:  09/06/2003  T:  09/06/2003  Job:  [id number]   cc:   Georgina Quint. Plotnikov, M.D. [facility]   Ranelle Oyster, M.D.  [address] [place]  [place] [address]  Fax: [phone]

## 2010-09-19 NOTE — Procedures (Signed)
[facility]  Patient:    Diane Abbott, Diane Abbott Visit Number: [id number] MRN: [id number]          Service Type: Attending:  Pierce Crane, M.D. Dictated by:   Pierce Crane, M.D. Proc. Date: 11/15/01                             Procedure Report  PROCEDURE PERFORMED:  Bone marrow biopsy.  DESCRIPTION OF PROCEDURE:  The patient underwent bone marrow biopsy today. She was placed in the prone position.  Xylocaine 2% was used to anesthetize the left posterior superior iliac spine.  Demerol 25 mg and 7 mg of Versed were used to anesthetize the patient.  The patient tolerated the procedure well.  Adequate samples were obtained.  The patient was discharged from the medical day surgery area in stable condition. Dictated by:   Pierce Crane, M.D. Attending:  Pierce Crane, M.D. DD:  11/15/01 TD:  11/15/01 Job: [id number]

## 2010-09-19 NOTE — Op Note (Signed)
NAMEJASANI, [name] NO.:  [id number]   MEDICAL RECORD NO.:  [id number]          PATIENT TYPE:  AMB   LOCATION:  DSC                          FACILITY:  [facility]   PHYSICIAN:  Etter Sjogren, M.D.     DATE OF BIRTH:  1934/11/18   DATE OF PROCEDURE:  12/04/2005  DATE OF DISCHARGE:                                 OPERATIVE REPORT   SURGEON:  Etter Sjogren, M.D.   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   POSTOPERATIVE DIAGNOSES:  1. Squamous cell carcinoma of the right cheek, greater than 1 cm.  2. Complicated open wound of the right cheek, greater than 2.5 cm.   PROCEDURE PERFORMED:  1. Excision of squamous cell carcinoma of the right cheek, greater than 1      cm.  2. Complicated open wound/complex wound closure, right cheek, greater than      2.5 cm.   ANESTHESIA:  Xylocaine 1% with epinephrine.   CLINICAL NOTE:  A 76 year old woman with squamous cell carcinoma of the  right cheek, who presents for wide excision.  The nature of the procedure  and risks were discussed with her and she wished to proceed.   DESCRIPTION OF PROCEDURE:  The patient was placed supine, prepped with  Betadine and draped with sterile drapes.  The excision was marked under  loupe magnification.  The excision was performed after successful local  anesthesia.  The specimen was removed.  The wound was irrigated thoroughly.  I did a layered closure with 5-0 Monocryl interrupted for the deep sutures,  5-0 Monocryl interrupted, inverted deep dermal sutures, and 6-0 Prolene  simple, running suture.  Antibiotic ointment and a dry sterile dressing with  a little bit of pressure applied.  She tolerated the procedure well.   DISPOSITION:  She will start her Lovenox back tomorrow.  She understands  there may be some oozing from this wound due to the blood thinning agent,  and she will just need to apply pressure.  I will see her back in the office  next week for recheck.      Etter Sjogren, M.D.  Electronically Signed     DB/MEDQ  D:  12/04/2005  T:  12/04/2005  Job:  [id number]

## 2010-09-19 NOTE — Op Note (Signed)
NAME:  Diane Abbott, Diane Abbott                         ACCOUNT NO.:  [id number]   MEDICAL RECORD NO.:  [id number]                   PATIENT TYPE:  INP   LOCATION:  X006                                 FACILITY:  [facility]   PHYSICIAN:  Ollen Gross, M.D.                 DATE OF BIRTH:  1935/03/20   DATE OF PROCEDURE:  08/06/2003  DATE OF DISCHARGE:                                 OPERATIVE REPORT   PREOPERATIVE DIAGNOSIS:  Osteoarthritis of the left knee, valgus deformity.   POSTOPERATIVE DIAGNOSES:  Osteoarthritis of the left knee, valgus deformity.  .   OPERATION/PROCEDURE:  Left total knee arthroplasty.   SURGEON:  Ollen Gross, M.D.   ASSISTANT:  Alexzandrew L. Perkins, P.A.-C.   ANESTHESIA:  Spinal with postoperative Marcaine pump.   ESTIMATED BLOOD LOSS:  Minimal.   DRAINS:  Hemovac x1.   COMPLICATIONS:  None.   TOURNIQUET TIME:  44 minutes at 300 mmHg.   DISPOSITION:  Stable to recovery.   BRIEF CLINICAL NOTE:  Diane Abbott 76 year old female with end-stage  osteoarthritis of the left knee with approximately 30-degree valgus  deformity.  She presents now for left total knee arthroplasty.   DESCRIPTION OF PROCEDURE:  After the successful administration of general  anesthesia, tourniquet was placed on the left thigh and left lower extremity  prepped and draped in the usual sterile fashion. The extremity was wrapped  in Esmarch, knee flexed, tourniquet inflated to 300 mmHg.   Midline incision was made with a 10-blade through the subcutaneous tissue to  the level of the extensor mechanism.  Given her significant valgus  deformity, I did a lateral parapatellar approach. The arthrotomy is made  with the 10 blade and then the soft tissue of the proximal lateral tissue is  periosteally elevated to the joint line with the knife and around  posterolateral aspect with the Cobb elevator.  We left the medial soft  tissue sleeve alone.  I then everted the patella medially and flexed  the  knee 90 degrees.  She had a massive defect on the lateral tibial plateau.  She was bone on bone in all three compartments.  We removed the remnants of  the PCL and then used the drill to create a starting hole at the distal  femur.  Canal was irrigated and 5-degree left valgus alignment guide was  placed.  The block is pinned to remove 10 mm off the distal femur.  Distal  femoral resection is made with an oscillating saw.  The sizing block was  placed and size 2 was the most appropriate.  Rotation was marked off the  epicondylar axis.  The size 2 cutting block is placed and the anterior and  posterior cuts made.   The tibia was subluxed forward and the meniscal remnants removed.  Extramedullary tibial alignment guide placed surfacing proximally at the  medial aspect of the tibial tubercle and distally along  the second  metatarsal axis and tibial crest.  I already pinned the block to remove 2 mm  of the defect laterally.  This lead to a resection of about 15 mm of  proximal tibia totally.  Resection was made with an oscillating saw.  The  size 2 was also the most appropriate tibial component and then the proximal  tibia was prepared to modular drill and keel punch.  Femoral preparation was  then completed with the intercondylar and chamfer cuts.   Size 2 mobile bearing tibial tray trial with the size 2 posterior stabilized  femur trial and a 15 mm rotating platform insert trial was placed.  With the  15, there was a little bit of varus and valgus play.  With the 17.5, which  allowed for full extension with excellent balance and good valgus and varus  stress throughout with full range of motion.  Her valgus deformity was  corrected back to neutral alignment.  The patella was everted and the  thickness measured to be 23 mm.  Free-hand resection taken to 14 mm, the 35  template placed, lug holes were drilled, trial patella was placed and it  tracks normally.  All trials were then  removed.  There were no posterior  osteophytes removed off the femur.  The wound was then copiously irrigated  with pulsatile lavage and cement mixed.  Once ready for implantation, the  size 2 mobile bearing tibial tray, size 2 posterior stabilized femur and 35  patella are cemented into place and patella held with a clamp.  The trial  17.5 was placed and held for full extension and all extruded cement removed.  Once the cement hardened, the permanent 17.5 posterior stabilized rotating  platform insert was placed with the tibial tray.  The wounds were then  copiously irrigated with antibiotic solution and tourniquet released after a  total time of 43 minutes.  Minor bleeding was stopped with the cautery.  The  arthrotomy was closed over a Hemovac drain with interrupted #1 PDS.  We left  open the area from the superior tibia inferior to the hole of the patella  laterally to serve as a mini-lateral release.  The patella tracks normally  with flexion over 135 degrees against gravity.  Subcutaneous tissues were  then closed with interrupted 2-0 Vicryl, subcuticular running 4-0 Monocryl.  The catheter for the Marcaine pain pump is then inserted but the pump is not  yet turned on.  Incision was cleaned and dried and Steri-Strips and a bulky  sterile dressing applied.  The patient was then awakened and transported to  recovery in stable condition.                                               Ollen Gross, M.D.    FA/MEDQ  D:  08/06/2003  T:  08/06/2003  Job:  [id number]

## 2010-09-19 NOTE — Discharge Summary (Signed)
[facility]  Patient:    Diane Abbott, Diane Abbott Visit Number: [id number] MRN: [id number]          Service Type: MED Location: [phone] Attending Physician:  Corwin Levins Dictated by:   Claretta Fraise, M.D. Admit Date:  09/02/2001 Discharge Date: 09/08/2001   CC:         Sonda Primes, M.D. [facility], [place]   Discharge Summary  DISCHARGE DIAGNOSES: 1. Cholelithiasis status post laparoscopic cholecystectomy on Sep 07, 2001. 2. History of deep venous thrombosis and pulmonary embolus on long-time    anticoagulation.  The rest of her past medical history is the same as listed on her H&P; please refer to that original H&P dated Sep 02, 2001.  DISCHARGE MEDICATIONS:  She is to continue on all of her home medications. The only two differences are that she is to go home on Lovenox 30 mg subcutaneously q.12h. for the next week.  She has been given two refills on the Lovenox, depending on what her INR does once the Coumadin is started.  She is to be on Coumadin 6 mg p.o. q.d. starting Monday, May 12, with INR to be checked on Wednesday at the Coumadin clinic.  Subsequent need for adjustments on the Coumadin will be based on that.  As mentioned earlier, the rest of her discharge medications are as she was taking at home, and they consist of:  1. Topamax 25 mg p.o. b.i.d.  2. Prilosec 20 mg p.o. b.i.d.  3. Folate 1 mg p.o. q.d.  4. Levsin 0.125 mg p.o. q.i.d.  5. Bextra 10 mg p.o. q.d.  6. OxyContin 20 mg p.o. b.i.d.  7. OxyIR 5 mg q.i.d.  8. Ultram 50 mg p.o. q.i.d.  9. Ativan 1 mg t.i.d. p.r.n. 10. Procrit monthly per Dr. Donnie Coffin. 11. B12 monthly. 12. Sonata 10 mg p.o. q.h.s. 13. Diflucan p.r.n. 14. Zoloft 100 mg p.o. q.d. 15. Arava 20 mg p.o. q.d. 16. Megace 40 mg p.o. q.d. 17. Iron supplementation. 18. Senokot. 19. Fosamax 70 mg p.o. weekly,. 20. Pyridium p.r.n.  ACTIVITY:  As tolerated.  DIET:  There have been no restrictions given to her by the  surgeon, Dr. Tawanna Cooler.  FOLLOWUP:  She is to follow up with Dr. Tawanna Cooler in two weeks.  She has been given his phone number to contact his office to set up this appointment.  Also recommend followup with Dr. Posey Rea in 7 to 10 days and also in the Coumadin clinic on Wednesday, Sep 14, 2001, to have PT and INR checked.  HOSPITAL COURSE:  This patient was admitted by Dr. Efrain Sella on May 2 with complaints of right upper quadrant pain.  It was subsequently discovered the pain was coming from gallstones.  Because of the fact that she had multiple medical problems, she was admitted on the medicine service.  With concerns of DVT and PE, pulmonary consult was called to ensure there was no reason for her surgery being held.  Pulmonary saw her and recommended repeat Dopplers to be done which came back negative for any acute DVT.  Her INR was therapeutic during this time.  They thought she had extremely low probability of a pulmonary embolism and felt there was no further need for VQ scan or CT of the chest to determine if she had PE.  She subsequently underwent laparoscopic cholecystectomy by Dr. Tawanna Cooler on Sep 07, 2001, and she did well with that procedure without any complications.  Please refer to Dr. Barbette Or surgical  note for further details on that.  The patient has tolerated p.o. intake.  On the morning of discharge, she ate a good breakfast and kept it down.  The patient is ready for discharge and wants to go home.  In regard to her history of DVT and pulmonary embolism in May 2002, on chronic anticoagulation secondary to various members having hypercoaguable state, as mentioned earlier, repeat Doppler of legs was obtained and was negative for DVT.  The patients heparin was held on the day of surgery.  Prior to that, her Coumadin was taken off also.  On postop day #1, the patient was started on Lovenox at 30 mg subcutaneously q.12h., and she is to remain on this until her INR is therapeutic once  Coumadin is started.  I had recommended to the patient to start her Coumadin on Monday, May 12, starting at 6 mg p.o. q.d. and to have INR checked on Wednesday, May 14, at the Coumadin clinic.  Based on that, the decision as to what her next Coumadin dose will be decided.  Once INR is therapeutic for a couple of days overlap on the Coumadin, she can come off the Coumadin, and this can be determined Shelby Dubin and her primary care physician, Dr. Posey Rea.  The patients family member was taught by the pharmacist on how to administer the Lovenox, and the patient is wanting to go home.  The patient is being discharged to home in stable condition. Dictated by:   Claretta Fraise, M.D. Attending Physician:  Corwin Levins DD:  09/08/01 TD:  09/12/01 Job: [id number]

## 2010-09-19 NOTE — Discharge Summary (Signed)
[facility]  Patient:    Diane Abbott, Diane Abbott                      MRN: [id number] Adm. Date:  [id number] Disc. Date: [id number] Attending:  Corwin Levins CC:         Pierce Crane, M.D.  Coumadin Clinic, Dr. Shelby Dubin   Discharge Summary  DATE OF BIRTH:  [birth year]  DISCHARGE DIAGNOSES:  1. Left leg extensive deep venous thrombosis.  2. Bilateral pulmonary embolus.  3. Hypoxemia due to problem #2.  4. Connective tissue disease of unknown etiology.  5. Fibromyalgia syndrome.  6. Front tooth injury.  7. Anemia.  ALLERGIES:  PENICILLIN and most BENZODIAZEPINES, except for the Ativan.  SURGERIES:  Status post appendectomy, history of T&A, status post TAH/BSO.  MEDICATIONS PRIOR TO ADMISSION:  1. Donnatal 1 q.i.d. and q.h.s.  2. Zoloft 100 q.d.  3. Sonata 10 mg at h.s.  4. Ativan 1 mg 1/2 q.a.m. and 1 in p.m.  5. OxyContin 20 mg b.i.d.  6. Levsin sublingual p.r.n.  7. Calcium supplement.  8. Vitamin D supplement.  9. Prilosec 20 mg b.i.d. 10. Arthrotec 50 mg 2 b.i.d. 11. Prednisone 20 mg q.d. 12. Premarin 0.9 mg q.d. 13. OxyIR 5 mg q.i.d. p.r.n.  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  No tobacco or alcohol.  FAMILY HISTORY:  Positive for DVT.  Daughter had miscarriage.  HOSPITAL COURSE:  She was admitted on Sep 17, 2000, with acute onset of chest pain, shortness of breath.  For the details, please address the history and physical on Sep 17, 2000.  During the course of hospitalization the patient was started on IV heparin and Coumadin.  She was hypoxic and treated with oxygen.  Her condition has greatly improved.  On the day of discharge she was feeling well, although still requiring oxygen to keep her O2 saturations up.  Her blood pressure was 123/58, respirations 16, heart rate 79, and temperature 97.1.  O2 saturation 97% on 1-1/2 liter nasal cannula.  Lungs were clear.  Heart was regular. Abdomen  soft and nontender.  Lower extremities without edema, nontender bilaterally.  She was alert, oriented, and cooperative.  CONSULTATIONS:  Pulmonary, Dr. Jayme Cloud.  DISCHARGE MEDICATIONS:  1. Coumadin 4 mg q.d. until instructed otherwise by Coumadin Clinic.  2. Resume other home medications, except for Premarin.  ACTIVITY:  Resume previous.  SPECIAL INSTRUCTIONS:  Do not take Premarin, wear compression stockings in day time.  FOLLOWUP PLANS:  Coumadin Clinic on Monday and Dr. Trinna Post Plotnikov in two weeks.  Oral surgeon today for consultation.  Dr. Donnie Coffin next week.  X-RAYS:  CT scan of the chest with lower extremity, positive for bilateral acute pulmonary embolism and left common femoral vein and external iliac vein deep venous thrombosis.  EKG with sinus tachycardia, otherwise unremarkable.  LABORATORY ON ADMISSION:  INR 2.8, day prior 2.8.  White count 5.4, hemoglobin 11.0/12.3, MCV 87.5, platelets normal.  INR on admission 1.1.  Factor V Leidin negative.  Protein S function, 112, normal.  Protein C function, 190, elevated.  Troponin 0.21, elevated.  CK normal. DD:  09/24/00 TD:  09/24/00 Job: [id number]

## 2010-09-19 NOTE — Letter (Signed)
December 10, 2005     Caira Poche  [address]  [place], [place]  [address]   RE:  ANELIA, CARRIVEAU  MRN:  [id number]  /  DOB:  09-10-1934   To Whom it May Concern:   Ms. Paelyn Smick has been a patient of mine for a number of years. She has  been suffering from a number of serious medical conditions through 2004-  2007. Her serious medical problems include blood clots in the lungs, blood  clots in the legs with chronic blood thinning therapy, connective tissue  disorder, rheumatoid arthritis, fibromyalgia, adrenal gland insufficiency,  vitamin B12 deficiency, and other problems.    Sincerely,      Sonda Primes, MD   AP/MedQ  DD:  12/10/2005  DT:  12/10/2005  Job #:  [id number]

## 2010-09-19 NOTE — H&P (Signed)
[facility]  Patient:    Diane Abbott, Diane Abbott                      MRN: [id number] Adm. Date:  [id number] Attending:  Corwin Levins CC:         Titus Dubin. Alwyn Ren, M.D. [facility]  Sophronia Simas., M.D., Rheumatology, [facility]   History and Physical  CHIEF COMPLAINT:  Chest pain, shortness of breath, increased troponin I and chest CT with PE, bilateral.  HISTORY OF PRESENT ILLNESS:  Diane Abbott is a 76 year old white female, here with acute onset at approximately 3 p.m. of symptoms above, better with O2 in the ER.  She has no more symptoms at this time and appears otherwise in no apparent distress.  She is on multiple medications including estrogen.  Family medical history is significant for father and daughter both with history of DVTs, daughter perigestation.  The patient herself has been also relatively immobile and not working for the past two years, with a former connective tissue disease, not well-defined, currently on prednisone trial for two months.  PAST MEDICAL HISTORY: Illnesses:  1. History of anemia.  2. Fibromyalgia.  3. Questionable connective tissue disorder.  Surgeries:  1. Status post appendectomy.  2. History of T&A.  3. Status post TAH/BSO.  ALLERGIES:  PENICILLIN and MOST BENZODIAZEPINES except for Ativan.  MEDICATIONS:  1. Donnatal one p.o. q.i.d. and q.h.s.  2. Zoloft 100 mg p.o. q.d.  3. Sonata 10 mg one to two q.h.s. p.r.n.  4. Ativan 1 mg one-half q.a.m., one q.p.m.  5. OxyContin 20 mg b.i.d.  6. Levsin sublingual p.r.n.  7. Calcium supplement.  8. Vitamin D supplement.  9. Prilosec 20 mg b.i.d. 10. Arthrotec 50 mg two b.i.d. 11. Prednisone 20 mg p.o. q.d. 12. Premarin 0.9 mg p.o. q.d. 13. OxyIR 5 mg one q.i.d. 14. She has a prescription bottle for megestrol but does not take it.  SOCIAL HISTORY:  No tobacco.  No alcohol.  Divorced.  Three children.  Retired Warden/ranger, bookkeeper.  FAMILY HISTORY:  As  above.  PHYSICAL EXAMINATION:  GENERAL:  Diane Abbott appears mildly chronically ill.  VITAL SIGNS:  Blood pressure 134/59, heart rate 118, respirations 20, temperature 97.8.  ENT:  Sclerae clear.  TMs clear.  Pharynx benign.  NECK:  Without JVD.  CHEST:  Clear to auscultation.  CARDIAC:  Tachycardic, otherwise, regular rate and rhythm.  ABDOMEN:  Chronic mild epigastric tenderness, otherwise, benign.  EXTREMITIES:  Strikingly benign appearing.  No palpable edema, no erythema, no tenderness and certainly no palpable cords in the left calf.  LABORATORY DATA:  Chest CT with bilateral pulmonary embolus.  Left leg also scanned with the CT and found to have DVT in the common femoral and external iliac.  Other labs include troponin I slightly high at 0.17, CPK 11, MB negative.  WBC 12.3, WBC 5.4, glucose 164.  Electrolytes, BUN, creatinine and LFTs otherwise within normal limits.  ECG:  Sinus tachycardia, no acute changes.  ASSESSMENT AND PLAN:  1. Pulmonary embolus/deep venous thrombosis, currently stable on O2.  She     will be admitted for intravenous heparin and coumadinization, given O2,     gradual ambulation.  Need to stop the Premarin.  With her very interesting     family history, we need to check inherited predisposition to blood clots     including protein C, protein S levels, antithrombin III levels, factor V  leiden mutation and homocystine levels.  Homocystine levels can also be     elevated due to renal insufficiency, but this does not appear to be the     case at this time.  2. Chest pain probably secondary to #1 but rule out myocardial infarction     with telemetry and serial enzymes.  3. Other medical problems as above.  We will need to change the Arthrotec to     Vioxx 25 mg p.o. q.d. as we are concerned about this in conjunction with     prednisone and intravenous heparin and risk of ulceration. DD:  09/17/00 TD:  09/19/00 Job: [id number]

## 2010-09-19 NOTE — Op Note (Signed)
NAMEKHALIAH, BARNICK NO.:  [id number]   MEDICAL RECORD NO.:  [id number]          PATIENT TYPE:  INP   LOCATION:  0473                         FACILITY:  [facility]   PHYSICIAN:  Ollen Gross, M.D.    DATE OF BIRTH:  02-May-1935   DATE OF PROCEDURE:  01/28/2004  DATE OF DISCHARGE:                                 OPERATIVE REPORT   PREOPERATIVE DIAGNOSIS:  Osteoarthritis, right knee.   POSTOPERATIVE DIAGNOSIS:  Osteoarthritis, right knee.   PROCEDURE:  Right total knee arthroplasty.   SURGEON:  Ollen Gross, M.D.   ASSISTANT:  Avel Peace, PA-C.   ANESTHESIA:  Spinal.   ESTIMATED BLOOD LOSS:  Minimal.   DRAINS:  Hemovac x1.   TOURNIQUET TIME:  Forty-one minutes at 300 mmHg.   COMPLICATIONS:  None.   CONDITION:  Stable to the recovery room.   CLINICAL NOTE:  Ms. Sherpa is a 76 year old female with end-stage arthritis  of the right knee and intractable pain.  She has had a previous successful  left total knee arthroplasty and presents now with a right total knee  arthroplasty.   PROCEDURE IN DETAIL:  After successful administration of spinal anesthetic,  a tourniquet was placed high on her right thigh, and her right lower  extremity prepped and draped in the usual sterile fashion.  The extremity  was wrapped in esmarch, the knee flexed, and the tourniquet inflated to 300  mmHg.  A standard midline incision was made with a 10 blade through the  subcutaneous tissue to the level of the extensor mechanism.  A fresh blade  is used to make a medial parapatellar arthrotomy, then the soft tissue over  the proximal medial tissue is subperiosteally elevated to the joint line  with a knife at the semimembranosus bursa with a curved osteotome.  Soft  tissue over the proximal lateral tibia is also elevated with attention being  paid to avoid a patellar tendon on the tibial tubercle.  The patella is  everted, and the knee flexed to 90 degrees and intercondylar  osteophytes are  removed.  I then removed the ACL and PCL.  A drill was used to create a  starting hole on the distal femur, and the canal is irrigated.  A 5 degree  right valgus alignment guide is placed, and the distal femoral block is  pinned to remove 10 mm from the distal femur.  A sizing block is then  placed, and size 3 is the most appropriate for the femoral component.  Rotation is marked off the epicondylar axis, and then a size 3 cutting block  placed.  The anterior and posterior cuts are then made.  The tibia is  subluxed forward, and the menisci are removed.  An extramedullary tibial  alignment guide is placed referencing proximally at the medial aspect of the  tibial tubercle and distally along the second metatarsal axis and tibial  crest.  The block is pinned to remove approximately 2 mm off the deficient  medial side.  She had significant medial deficiency.  The size 3 is the most  appropriate tibial component,  and the proximal tibia is repaired with a  modular drill and keel punch of 4.3.  Femoral preparation is then completed  with the intercondylar and chamfer cuts.   The size 3 posterior stabilizing femoral trial and size 3 mobile bearing  tibial trial, and a 10 mm posterior stabilized rotating platform insert  trial were placed.  With the 10, full extension is achieved with excellent  varus and valgus balance throughout full range of motion.  The patella is  then everted.  Thickness measured to be 26 mm.  Free-hand resection is taken  to 15 mm.  A 35 template is placed.  Lug holes are drilled.  The trial  patella is placed.  It tracks normally.  Osteophytes were then removed off  the posterior femur, and a trial is then placed.  The trial is then removed,  and the cut bone surfaces were prepared with pulsatile lavage.  Cement is  mixed and once ready for implantation, the size 3 mobile-bearing tibial  tray, size 3 posterior stabilized femur, and 35 patella are cemented  into  place, and the patella is held with a clamp.  A trial 10 mm insert is  placed, and the knee held in full extension, and all extruded cement  removed.  Once the cement is fully hardened, a permanent 10 mm posterior  stabilizer rotating platform insert is placed into the tibial tray.  The  wound is then copiously irrigated with antibiotic solution, and the extensor  mechanism closed over a Hemovac drain with interrupted #1 PDS.  Flexion  against gravity is 135 degrees.  The tourniquet is released for a total time  of 41 minutes.  The subcu is closed with interrupted 2-0 Vicryl.  The  subcuticular with running 4-0 Monocryl.  The incision is cleaned and dried,  then Steri-Strips and bulky sterile dressing applied.  She was then awakened  and transported to the recovery room in stable condition.      FA/MEDQ  D:  01/28/2004  T:  01/28/2004  Job:  [id number]

## 2010-09-19 NOTE — Op Note (Signed)
[facility]  Patient:    Diane Abbott, Diane Abbott Visit Number: [id number] MRN: [id number]          Service Type: MED Location: [phone] Attending Physician:  Corwin Levins Proc. Date: 09/07/01 Admit Date:  09/02/2001 Discharge Date: 09/08/2001                             Operative Report  PREOPERATIVE DIAGNOSIS:  Cholelithiasis.  POSTOPERATIVE DIAGNOSIS:  Cholelithiasis.  PROCEDURE PERFORMED:  Laparoscopic cholecystectomy.  SURGEON:  Ollen Gross. Vernell Morgans, M.D.  ASSISTANT:  Angelia Mould. Derrell Lolling, M.D.  ANESTHESIA:  General endotracheal.  DESCRIPTION OF PROCEDURE:  After informed consent was obtained, the patient was brought to the operating room and placed in the supine position on the operating room table.  After adequate induction of general anesthesia, the patients abdomen was prepped with Betadine, and draped in the usual sterile manner.  The area above the umbilicus was infiltrated with 0.25% Marcaine and a small transverse incision was made with a 15 blade knife.  This incision was carried down through the subcutaneous tissue using blunt dissection with the Army-Navy retractor and Kelly clamp until the linea alba was identified.  The linea alba was incised with the 15 blade knife and each side was grasped with Kocher clamps and elevated anteriorly.  The preperitoneal space was probed bluntly with the Hemostat until the peritoneum was opened and access was gained to the abdominal cavity.  A finger was inserted through this opening to palpate the anterior abdominal wall and there were no apparent adhesions.  A 0 Vicryl pursestring stitch was then placed in the fascia surrounding this opening.  A Hasson cannula was placed through this opening and anchored in place with a previously placed Vicryl pursestring stitch.  The abdomen was insufflated with carbon dioxide without difficulty.  The laparoscope was placed through the Hasson cannula and the dome of  the gallbladder and liver edge were readily identifiable.  The patient was placed in head-up position and a small upper midline transverse incision was made after infiltrating this area with 0.25% Marcaine.  A 10 mm port was then placed bluntly through this incision into the abdominal cavity under direct vision.  Sites were then chosen laterally on the right side of the abdomen for placement of 5 mm ports and each of these areas was infiltrated with 0.25% Marcaine.  Small stab incisions were made with a 15 blade and 5 mm ports were placed bluntly through these incisions into the abdominal cavity under direct vision.  A blunt grasper was placed through the lateral most 5 mm port and used to grasp the dome of the gallbladder and elevated anteriorly and superiorly.  Another blunt grasper was placed through the other 5 mm port and used to retract on the body and neck of the gallbladder.  A dissector was placed through the upper midline port and using a combination of the electrocautery and blunt dissection, the peritoneal reflection over the top of the gallbladder neck area was opened. Blunt dissection was then carried out in this area until the gallbladder neck cystic duct junction was readily identified.  Three clips were then placed proximally and one distally on the cystic duct and the duct was divided between the two.  Posterior to this, the cystic artery was identified and again dissected in a circumferential manner until a good window was created. Two clips were placed proximally and one distally on  the artery, and the artery was identified between the two.  Next, the laparoscopic hook cautery device was used to separate the gallbladder from the liver bed.  Prior to completely detaching the gallbladder from the liver bed, the liver bed was inspected and several small bleeding points were coagulated with the Bovie electrocautery.  The gallbladder was then detached the rest of the way  from the liver bed.  The laparoscope was then moved to the upper midline port and a gallbladder grasper was placed through the Hasson cannula and used to grasp the neck of the gallbladder.  The gallbladder was removed with the Hasson cannula through the supraumbilical port.  The Hasson cannula was then replaced and the laparoscope was placed back through the supraumbilical port.  The abdomen was then irrigated with copious amounts of saline until the effluent was clear.  The liver was examined again and found to be hemostatic.  The ports were then all removed under direct vision without difficulty.  The fascia at the supraumbilical port was closed with the previously placed Vicryl pursestring stitch.  The skin incisions were then all closed with interrupted 4-0 Monocryl subcuticular stitches.  Benzoin and Steri-Strips were applied. The patient tolerated the procedure well.  At the end of the case, all needle, sponge, and instrument counts were correct.  The patient was awakened and taken to the recovery room in stable condition. Attending Physician:  Corwin Levins DD:  09/24/01 TD:  09/27/01 Job: [id number]

## 2010-09-19 NOTE — Discharge Summary (Signed)
NAMEHIYAB, NHEM NO.:  [id number]   MEDICAL RECORD NO.:  [id number]          PATIENT TYPE:  INP   LOCATION:  0473                         FACILITY:  [facility]   PHYSICIAN:  Ollen Gross, M.D.    DATE OF BIRTH:  1935-04-23   DATE OF ADMISSION:  01/28/2004  DATE OF DISCHARGE:  02/01/2004                                 DISCHARGE SUMMARY   ADMISSION DIAGNOSES:  1.  Osteoarthritis, right knee.  2.  Rheumatoid arthritis.  3.  Fibromyalgia.  4.  Significant history of respiratory arrest following previous abdominal      surgery 5 to 7 years ago.  5.  Reflux disease.  6.  History of bronchitis.  7.  Hemorrhoids.  8.  History of urinary tract infection.  9.  History of deep vein thrombosis.  10. History of bilateral pulmonary embolism from previous deep vein      thrombosis.   DISCHARGE DIAGNOSES:  1.  Osteoarthritis, right knee status post right total knee arthroplasty.  2.  Rheumatoid arthritis.  3.  Fibromyalgia.  4.  Significant history of respiratory arrest following previous abdominal      surgery 5 to 7 years ago.  5.  Reflux disease.  6.  History of bronchitis.  7.  Hemorrhoids.  8.  History of urinary tract infection.  9.  History of deep vein thrombosis.  10. History of bilateral pulmonary embolism from previous deep vein      thrombosis.  11. Postoperative blood loss anemia.  12. Status post transfusion without sequelae.  13. Postoperative hypokalemia, resolved.   PROCEDURE:  Date of surgery January 28, 2004, right total knee  arthroplasty. Surgeon Dr. Trudee Grip, assistant Avel Peace, P.A.-C.  Spinal anesthesia. Minimal blood loss. Hemovac drain x1. Tourniquet time 41  minutes, 300 mmHg.   BRIEF HISTORY:  Ms. Fabio is a 76 year old female with end-stage  osteoarthritis of the right knee and intractable pain. Previous successful  left total knee, now presents for right total knee arthroplasty.   HOSPITAL COURSE:  The patient admitted  to [facility], taken to the  OR, underwent the above stated procedure without complication. The patient  tolerated the procedure well, later transferred to the recovery room and  then to the orthopedic floor to continue postoperative care. Vital signs  were followed. Given p.o. and IV analgesia for pain control following  surgery. Due to the significant history of her previous DVT and PE, she was  placed on Coumadin and concomitant Lovenox for coverage. On day 1, she had  moderate pain through that night, was doing a little bit better on the  morning of day 1. The drain came out the night before. Adjusted her pain  medications for better control. Initially was going to order a rehab  consult, but she did not want to consider going to rehab. She wanted to go  home. Therefore, that was cancelled. By day 2, she was doing a little bit  better. Blood had dropped down to 9.0. She was asymptomatic at that time.  Dressing was changed. Incision was healing well. She started  getting up with  physical therapy. It was noted, however, the following day her hemoglobin  dropped down to 7.8. Her Coumadin was held due to INR became  supratherapeutic, shot up to 4.6. Rechecked her coags and hemoglobin. She  was given blood on day #3. She tolerated the blood well, and her hemoglobin  came back up to 10.5. From a therapy standpoint, she was actually doing  pretty well. She started off slow, was only ambulating approximately 10 and  20 feet by day 2; however, by day 3, she walked 80 feet, and this is the day  that she also had received her blood, so she was doing quite well. By day 4,  she tolerated her blood well. She was in good condition, no complaints, and  was discharged home.   DISCHARGE PLAN:  1.  The patient was discharged home on February 01, 2004.  2.  Discharge diagnoses:  Please see above.  3.  Discharge medications:  Percocet, Phenergan, Flexeril, and Coumadin.  4.  Diet:  Resume  previous home diet.  5.  Activity:  Weight bearing as tolerated. Home health PT, home health      nursing, total knee protocol. May start showering.   DISPOSITION:  Home.   CONDITION ON DISCHARGE:  Improved.     Alex   ALP/MEDQ  D:  03/10/2004  T:  03/11/2004  Job:  [id number]

## 2010-09-19 NOTE — H&P (Signed)
NAME:  Diane Abbott, TISCHER NO.:  [id number]   MEDICAL RECORD NO.:  [id number]                   PATIENT TYPE:  INP   LOCATION:  X006                                 FACILITY:  [facility]   PHYSICIAN:  Ollen Gross, M.D.                 DATE OF BIRTH:  July 24, 1934   DATE OF ADMISSION:  08/06/2003  DATE OF DISCHARGE:                                HISTORY & PHYSICAL   ANTICIPATED DATE OF ADMISSION:  Office visit History and Physical July 31, 2003 for admission August 06, 2003.   CHIEF COMPLAINT:  Left knee pain.   HISTORY OF PRESENT ILLNESS:  The patient is a 76 year old female with a  continued progressive history of bilateral knee pain.  She has known  arthritis in both knees and feels that she is getting progressively worse.  She has been on Remicade for rheumatoid arthritis which has improved her  swelling but the pain is to the point where it is hard getting up.  She is  seen in the office where x-rays show end-stage arthritis in both knees with  a slight varus deformity on the right.  On the left she has about a 25  degree varus deformity.  She has benefited from cortisone in the past but  this is only temporary.  It is felt she has reached the point where she can  benefit from undergoing total knee replacement.  Risks and benefits are  discussed.  The patient elects to proceed with surgery.   ALLERGIES:  PENICILLIN causes severe rash, BENZODIAZEPINES, EFFEXOR,  NEURONTIN, and VIOXX.   CURRENT MEDICATIONS:  1. Topamax 25 mg twice a day.  2. Coumadin stopped prior to surgery.  3. Folic acid 1 mg daily.  4. __________ 1 tablet twice a day.  5. Belladonna 1 tablet twice a day.  6. Levsin 0.125 mg 1 tablet twice a day.  7. OxyContin 30 mg total twice a day.  8. OxyIR 5 mg four times a day.  9. Sonata 10 mg two tablets at bedtime.  10.      Diflucan p.r.n.  11.      Klonopin 1 mg twice a day.  12.      Fosamax 70 mg once a week.  13.      Pyridium  Plus as needed.  14.      Vitamin B12 injections monthly.  15.      She is on Procrit shots as needed.  16.      Protonix 40 mg twice a day.  17.      Zoloft 100 mg twice a day.  18.      Megestrol 40 mg as needed.  19.      Ferrous sulfate twice a day.  20.      Senokot daily.   PAST MEDICAL HISTORY:  1. Fibromyalgia.  2. Rheumatoid arthritis.  3. Reflux disease.  4. History of bronchitis.  5. History of urinary retention.  6. She has had a significant history of a respiratory arrest following a     previous abdominal surgery about 5 to 7 years ago.  7. History of deep vein thrombosis in her left leg about 3 years ago after a     fall which resulted in bilateral pulmonary embolism.   PAST SURGICAL HISTORY:  1. Bilateral oophorectomy with a tummy tuck.  2. Hysterectomy.  3. Right wrist surgery.  4. Appendectomy.   SOCIAL HISTORY:  Divorced, 3 children, denies use of tobacco products or  alcohol products.   FAMILY HISTORY:  Mother deceased at age 6 with cancer.  Father deceased at  age 81 with emphysema.   REVIEW OF SYSTEMS:  GENERAL:  She has had some night sweats which she states  she incurs all the time.  No fevers or chills.  NEUROLOGICAL:  Fibromyalgia.  No seizures, syncope, paralysis.  RESPIRATORY:  She gets a  little bit of a respiratory cough, nonproductive, which she attributes to  her medications.  She does have a little bit of shortness of breath when she  exerts herself; however, no shortness of breath at rest.  No hemoptysis.  CARDIOVASCULAR:  No chest pain, angina, orthopnea.  GI:  No nausea,  vomiting, diarrhea, constipation.  GU:  No dysuria, hematuria, or discharge.  MUSCULOSKELETAL:  Pertinent to that of the knee found in the History of  Present Illness.   PHYSICAL EXAMINATION:  VITAL SIGNS:  Pulse 68, respirations 12, blood  pressure 100/62.  GENERAL:  A 76 year old white female, well nourished, well developed.  In no  acute distress.  Alert,  oriented, and cooperative.  Mildly anxious at the  time of the visit.  HEENT:  Normocephalic, atraumatic.  Pupils round and reactive.  Oropharynx  clear.  NECK:  Supple.  No carotid bruits are appreciated.  CHEST:  Clear anterior and posterior chest walls.  No rhonchi, rales, or  wheezing.  HEART:  Regular rate and rhythm.  There is a grade 2/6 early systolic  ejection murmur.  No rubs, rales or palpitations.  ABDOMEN:  Soft, nontender, bowel sounds are present.  RECTAL, BREASTS, GENITALIA:  Not done and not pertinent to present illness.  EXTREMITIES:  Significant as to the left knee.  Left knee shows a range of  motion of 5 to 115 degrees, marked crepitus is noted.  There is no obvious  instability.  She does have a 25-degree valgus deformity.  Right Knee:  Right knee shows a range of motion of 5 to 120 degree, moderate crepitus is  noted.  No instability.  Slight valgus malalignment.   IMPRESSION:  1. Osteoarthritis bilateral knees, left more symptomatic than right.  2. Rheumatoid arthritis.  3. Fibromyalgia.  4. Significant history of respiratory arrest following previous abdominal     surgery 5 to 7 years ago.  5. Reflux disease.  6. History of bronchitis.  7. History of urinary retention.  8. History of deep vein thrombosis left leg approximately 3 years ago     following a fall.  9. History of bilateral pulmonary embolism from left leg deep vein     thrombosis approximately 3 years ago.   PLAN:  The patient will be admitted to [facility] and undergo a  left total knee replacement arthroplasty.  She has been on Coumadin  preoperatively which was stopped and she started on Lovenox up to the time  prior to surgery.  Lovenox was stopped 24 hours prior to surgery.  Surgery  will be performed by Dr. Ollen Gross.     Alexzandrew L. Julien Girt, P.A.              Ollen Gross, M.D.   ALP/MEDQ  D:  08/05/2003  T:  08/06/2003  Job:  [id number]

## 2010-09-19 NOTE — H&P (Signed)
[facility]  Patient:    Diane Abbott, Diane Abbott Visit Number: [id number] MRN: [id number]          Service Type: MED Location: [phone] Attending Physician:  Corwin Levins Dictated by:   Corwin Levins, M.D. [facility] Admit Date:  09/02/2001                           History and Physical  CHIEF COMPLAINT:  Right upper quadrant pain.  HISTORY OF PRESENT ILLNESS:  The patient is a 76 year old with a history of progressive increasing right upper quadrant and epigastric discomfort for the last several weeks after intermittent discomfort for the last several years. Recent ultrasound has shown gallstones.  She is now here in the emergency room sort of expecting to have surgery this evening.  I was asked by general surgery to admit due to multiple medical problems, not the least of which is a likely heritable hypercoagulable state with multiple family members with DVTs and PE, herself possibly exacerbated by estrogen use May 2002, resulting in bilateral large DVT/pulmonary embolus May 2002, now on chronic home O2.  PAST MEDICAL HISTORY:  1. Rheumatoid arthritis per [facility] rheumatologist, Dr. Vanessa Kick.  2. Fibromyalgia.  3. History of DVT/pulmonary embolus May 2002, on chronic home O2.  4. History of irritable bowel syndrome.  5. Diverticulosis.  6. History of bilateral reflux.  7. Osteoporosis.  8. B12 deficiency, on replacement.  9. DJD of the knees. 10. Normocytic anemia with leukopenia and thrombocytosis, status post bone     marrow biopsy January 2002, per Dr. Donnie Coffin, local hematology.  No evidence     of malignancy.  PAST SURGICAL HISTORY:  1. Status post TAH.  2. Status post T&A.  3. Status post appendectomy.  4. Status post BSO, with difficult postoperative course.  ALLERGIES:  PENICILLIN, NEURONTIN, VIOXX, EFFEXOR, and "ALL BENZODIAZEPINES" except Ativan.  CURRENT MEDICATIONS:  1. Home O2 at 2 L.  2. Topamax 25 mg b.i.d.  3. Coumadin 6 mg  p.o. q.d. except 4 mg on Monday and Friday.  4. Prilosec 20 mg b.i.d.  5. Folate 1 mg.  6. Belladonna.  7. Levsin 0.125 mg q.i.d.  8. Bextra 10 mg p.o. q.d.  9. OxyContin 20 mg b.i.d. 10. OxyIR 5 mg q.i.d. 11. Ultram 50 mg q.i.d. 12. Ativan 1 mg t.i.d. p.r.n. 13. She just stopped prednisone. 14. Procrit on a monthly basis per Dr. Donnie Coffin. 15. B12 monthly as well. 16. Sonata 10 mg q.h.s. 17. Diflucan p.r.n. 18. Zoloft 100 mg p.o. q.d. 19. Arava 20 mg p.o. q.d. 20. Megestrol 40 mg p.o. q.d. 21. Iron supplement. 22. Senokot. 23. Fosamax 70 mg weekly. 24. Pyridium p.r.n.  SOCIAL HISTORY:  No tobacco, no alcohol.  Currently divorced.  She is a retired Catering manager.  FAMILY HISTORY:  Her father and daughter both with DVT.  Second daughter with pulmonary embolus last year.  PHYSICAL EXAMINATION:  GENERAL:  Pleasant.  In no apparent distress.  VITAL SIGNS:  Afebrile, blood pressure 104/72, heart rate 119, respirations 20, O2 saturation 96% on 2 L.  HEENT:  Sclerae clear.  TMs clear.  Oropharynx benign.  NECK:  Supple.  Without lymphadenopathy, JVD, thyromegaly.  CHEST:  Without rales or wheezing.  CARDIAC:  Regular rate and rhythm.  ABDOMEN:  Right upper quadrant tenderness.  Otherwise positive bowel sounds, otherwise benign.  EXTREMITIES:  No edema.  NEUROLOGIC:  Cranial nerves II-XII intact, otherwise nonfocal.  LABORATORY DATA:  UA negative except for 1+ protein and 7-10 red blood cells. PTT for some reason is over 200, not on Coumadin, INR is 18, suspect laboratory error.  CMET with potassium 3.2, LFTs okay, creatinine 1.2, otherwise within normal limits.  CBC:  White blood cell count 5.7, hemoglobin 13.1, normal MCV.  ASSESSMENT AND PLAN: 1. Abdominal pain, questionable gallbladder with long history of irritable    bowel syndrome, reflux:  Will continue current gastrointestinal    medications.  Consider HIDA scan and gastrointestinal consult in the     morning. 2. History of deep vein thrombosis and pulmonary embolus:  Need to recheck    the coagulations as I suspect laboratory error.  Hold the Coumadin for    now.  Reverse with vitamin K if necessary.  Start IV heparin in order to    stop more quickly than low molecular weight heparin as surgery to be done.    Otherwise, consider low molecular weight postoperatively.  There is no    evidence of thrombocytopenia in the past as noted by the general surgeon    as per patient report, but she actually had thrombocytosis with leukopenia,    apparently with negative bone marrow as above, so that she should not be    more than average risk for heparin antibodies. 3. Other medical problems as above.  ______ "active" at this time    individually enough to preclude surgery with clearly as frail and higher    risk given the sum total of her comorbidities.  She is admitted to further    stabilize, characterize her discomfort, and proceed carefully, doing the    procedure should we deem that necessary. Dictated by:   Corwin Levins, M.D. [facility] Attending Physician:  Corwin Levins DD:  09/02/01 TD:  09/04/01 Job: [id number]

## 2010-09-19 NOTE — Procedures (Signed)
[facility]  Patient:    Diane Abbott, Diane Abbott                        MRN: [id number] Attending:  Hedwig Morton. Juanda Chance, M.D. [facility] CC:         Lilly Cove, M.D.   Procedure Report  PROCEDURE:  Upper endoscopy.  INDICATIONS:  This 76 year old white female has experienced abdominal pain while taking Arthrotec 50 mg twice a day and Ultram.  She has a history of depression and irritable bowel syndrome.  She has had documented weight loss of about 20 pounds.  On physical exam, she had a fullness in the epigastric area.  She is scheduled for CT scan of the abdomen tomorrow and she is undergoing upper endoscope to evaluate her abdominal pain as well as weight loss.  ENDOSCOPIST:  Hedwig Morton. Juanda Chance, M.D.  ENDOSCOPE:  Olympus single-channel videoscope.  SEDATION:  Versed 5 mg IV, Demerol 25 mg IV.  FINDINGS:  Olympus single-channel videoscope was passed directly into the posterior pharynx and into the esophagus.  Patient was monitored by pulse oximetry; her oxygen saturations were normal.  Proximal and distal esophageal mucosa was unremarkable.  There was no evidence of esophagitis or hiatal hernia.  Stomach:  Stomach was insufflated with air and showed normal gastric folds. There was no gastritis.  Bile was refluxing through the pylorus into the gastric lumen.  Biopsies were taken from gastric antrum for CLOtest.  There was some minimal erythema in the prepyloric antrum.  Fundus and cardia were normal.  Duodenum:  Duodenal bulb and descending duodenum were unremarkable.  IMPRESSION 1. Normal upper endoscopy of esophagus, stomach and duodenum, status post    CLOtest. 2. Bile reflux.  PLAN:  Patient will need further investigation of her weight loss.  She is for CT scan of the abdomen and pelvis tomorrow and most likely will also need a colonoscopy.  She has discontinued her Arthrotec and is on Prilosec 20 mg twice a day. DD:  03/26/00 TD:  03/26/00 Job:  [id number]

## 2010-09-19 NOTE — Discharge Summary (Signed)
[name], Diane Abbott NO.:  [id number]   MEDICAL RECORD NO.:  [id number]          PATIENT TYPE:  INP   LOCATION:  1321                         FACILITY:  [facility]   PHYSICIAN:  Pierce Crane, M.D.      DATE OF BIRTH:  November 07, 1934   DATE OF ADMISSION:  09/29/2006  DATE OF DISCHARGE:  10/04/2006                               DISCHARGE SUMMARY   DISCHARGE DIAGNOSES:  1. History of recurrent deep venous thrombosis.  2. History of pulmonary embolism.  3. History of  osteoarthritis.  4. History of   Dictation ended at this point.      Pierce Crane, M.D.  Electronically Signed     PR/MEDQ  D:  12/21/2006  T:  12/21/2006  Job:  [id number]

## 2010-09-19 NOTE — H&P (Signed)
NAMESERAH, NICOLETTI NO.:  [id number]   MEDICAL RECORD NO.:  [id number]          PATIENT TYPE:  INP   LOCATION:  NA                           FACILITY:  [facility]   PHYSICIAN:  Ollen Gross, M.D.    DATE OF BIRTH:  Oct 22, 1934   DATE OF ADMISSION:  01/28/2004  DATE OF DISCHARGE:                                HISTORY & PHYSICAL   Date of office visit, history and physical:  January 17, 2004.   CHIEF COMPLAINT:  Right knee pain.   HISTORY OF PRESENT ILLNESS:  This is a 76 year old female well-known to  Ollen Gross, M.D., who has a longstanding history of severe pain and  arthritis in the right knee that has been ongoing for some time now.  She  has previously had a left total knee arthroplasty and done extremely well  with this.  She has been seen and treated for her arthritis in the past  nonsurgically, also including injection.  She continues to have pain and due  to family and social issues, she is trying to push her surgery off until  September.  She has reached a point now where she would like to have  something done about it.  Risks and benefits of the procedure have been  discussed with the patient.  She elects to proceed with surgery.   ALLERGIES:  PENICILLIN causes rash.  BENZODIAZEPINES, EFFEXOR, NEURONTIN,  and VIOXX.   CURRENT MEDICATIONS:  1.  Topamax 25 mg twice a day.  2.  Coumadin, stopped prior to surgery.  3.  Folic acid 1 mg daily.  4.  Voltoin one tablet twice a day.  5.  Belladonna one tablet twice a day.  6.  Boniva one tablet monthly.  7.  OxyContin 30 mg twice a day.  8.  Diflucan as needed.  9.  Requip 2 mg p.o. daily.  10. Vesicare 5 mg p.o. daily.  11. Meclizine 12.5 mg q.i.d. p.r.n.  12. Pyridium Plus as needed.  13. LUNESTA 2 mg p.o. q.h.s.  14. Procrit shots as needed.  15. Protonix 40 mg twice a day.  16. Zoloft 100 mg twice a day.  17. Ferrous sulfate twice a day.   PAST MEDICAL HISTORY:  1.  Fibromyalgia.  2.   History of migraines.  3.  History of bronchitis.  4.  Hiatal hernia.  5.  Reflux disease.  6.  Hemorrhoids.  7.  Urinary tract infections.  8.  Cystitis.  9.  History of anemia.  10. Rheumatoid arthritis.  11. History history of respiratory arrest following a previous abdominal      surgery.  12. Deep vein thrombosis in her left leg after a fall.  13. History of bilateral pulmonary embolism following DVT.   PAST SURGICAL HISTORY:  1.  Appendectomy.  2.  Right wrist surgery.  3.  Hysterectomy.  4.  Bilateral oophorectomy with a tummy tuck.  5.  Left total knee replacement.  6.  Cosmetic surgery.   SOCIAL HISTORY:  Divorced, retired.  Nonsmoker, no alcohol.  Has three  children.   FAMILY HISTORY:  Mother deceased at 25 with cancer, with the father deceased  at age 69 with emphysema.   REVIEW OF SYSTEMS:  GENERAL:  No fevers, chills, night sweats.  NEUROLOGIC:  She does have fibromyalgia.  No seizure, syncope, or paralysis.  RESPIRATORY:  No shortness of breath, productive cough, or hemoptysis.  CARDIOVASCULAR:  No chest pain, angina, or orthopnea.  GASTROINTESTINAL:  History of reflux and hiatal hernia.  No nausea, vomiting, diarrhea, or  constipation.  GENITOURINARY:  No dysuria, hematuria, or discharge.  MUSCULOSKELETAL:  Pertinent to that of the knee, found in the history of  present illness.   PHYSICAL EXAMINATION:  VITAL SIGNS:  Pulse 76, respirations 14, blood  pressure 112/64.  GENERAL:  The patient is a 76 year old white female, well-nourished, well-  developed, in no acute distress.  She is alert and oriented and cooperative,  mildly anxious at the time of exam.  HEENT:  Normocephalic, atraumatic.  Pupils are round and reactive. EOMs are  intact.  The patient is noted to wear glasses.  NECK:  Supple, no carotid bruits.  CHEST:  Clear anterior, posterior chest walls.  No rhonchi, rales, or  wheezing.  CARDIAC:  Regular rate and rhythm, a grade 2/6 early systolic  ejection  murmur.  No rubs, thrills, or palpitations.  ABDOMEN:  Soft, nontender, bowel sounds present.  RECTAL, BREASTS, GENITALIA:  Not done, not pertinent to present illness.  EXTREMITIES:  Pertinent to the right knee.  Right knee shows a varus  deformity and malalignment.  She does have general tenderness throughout the  right knee.  Marked crepitus is noted on passive range of motion.   IMPRESSION:  1.  Osteoarthritis, right knee.  2.  Rheumatoid arthritis.  3.  Fibromyalgia.  4.  Significant history of respiratory arrest following a previous abdominal      surgery five to seven years ago.  5.  Reflux disease.  6.  History of bronchitis.  7.  Hemorrhoids.  8.  History of urinary tract infection.  9.  History of deep vein thrombosis.  10. History of bilateral pulmonary embolism from previous deep vein      thrombosis.   PLAN:  The patient will be admitted to [facility] and undergo a  right total knee replacement arthroplasty.  Surgery will be performed by Dr.  Ollen Gross.      ALP/MEDQ  D:  01/24/2004  T:  01/25/2004  Job:  [id number]   cc:   Georgina Quint. Plotnikov, M.D. [facility]

## 2010-11-03 ENCOUNTER — Ambulatory Visit
Admission: RE | Admit: 2010-11-03 | Discharge: 2010-11-03 | Disposition: A | Discharge status: Home / Self Care | Payer: Medicare Other | Source: Ambulatory Visit | Attending: Oncology | Admitting: Oncology

## 2010-11-03 DIAGNOSIS — Z1231 Encounter for screening mammogram for malignant neoplasm of breast: Secondary | ICD-10-CM

## 2010-11-04 ENCOUNTER — Encounter: Payer: Self-pay | Admitting: Internal Medicine

## 2010-11-04 ENCOUNTER — Ambulatory Visit (INDEPENDENT_AMBULATORY_CARE_PROVIDER_SITE_OTHER): Payer: Medicare Other | Admitting: Internal Medicine

## 2010-11-04 VITALS — BP 128/90 | HR 80 | Temp 97.9°F | Resp 16 | Ht 61.25 in | Wt 158.0 lb

## 2010-11-04 DIAGNOSIS — G2581 Restless legs syndrome: Secondary | ICD-10-CM

## 2010-11-04 DIAGNOSIS — F329 Major depressive disorder, single episode, unspecified: Secondary | ICD-10-CM

## 2010-11-04 DIAGNOSIS — IMO0001 Reserved for inherently not codable concepts without codable children: Secondary | ICD-10-CM

## 2010-11-04 DIAGNOSIS — G4700 Insomnia, unspecified: Secondary | ICD-10-CM | POA: Insufficient documentation

## 2010-11-04 DIAGNOSIS — E559 Vitamin D deficiency, unspecified: Secondary | ICD-10-CM

## 2010-11-04 DIAGNOSIS — Z23 Encounter for immunization: Secondary | ICD-10-CM

## 2010-11-04 DIAGNOSIS — Z2911 Encounter for prophylactic immunotherapy for respiratory syncytial virus (RSV): Secondary | ICD-10-CM

## 2010-11-04 MED ORDER — OXYCODONE HCL 10 MG PO TB12: 10.0000 mg | ORAL_TABLET | Freq: Every day | ORAL | Status: DC | PRN

## 2010-11-04 MED ORDER — TRIAZOLAM 0.25 MG PO TABS: 0.2500 mg | ORAL_TABLET | Freq: Every evening | ORAL | Status: DC | PRN

## 2010-11-04 MED ORDER — ROPINIROLE HCL 2 MG PO TABS: ORAL_TABLET | ORAL | Status: DC

## 2010-11-04 NOTE — Progress Notes (Signed)
  Subjective:    Patient ID: Diane Abbott, female    DOB: May 09, 1934, 76 y.o.   MRN: [id number]  HPI  The patient presents for a follow-up of  chronic hypertension, chronic dyslipidemia,chronic pain controlled with medicines. Restless legs are worse. Insomnia is terrible - not sleeping at all.    Review of Systems  Constitutional: Positive for fatigue. Negative for chills, activity change, appetite change and unexpected weight change.  HENT: Negative for congestion, mouth sores and sinus pressure.   Eyes: Negative for visual disturbance.  Respiratory: Negative for cough and chest tightness.   Gastrointestinal: Negative for nausea and abdominal pain.  Genitourinary: Negative for frequency, difficulty urinating and vaginal pain.  Musculoskeletal: Positive for back pain. Negative for gait problem.  Skin: Negative for pallor and rash.  Neurological: Negative for dizziness, tremors, weakness, numbness and headaches.  Psychiatric/Behavioral: Positive for sleep disturbance and decreased concentration. Negative for suicidal ideas and confusion. The patient is nervous/anxious.        Objective:   Physical Exam  Constitutional: She appears well-developed and well-nourished. No distress.  HENT:  Head: Normocephalic.  Right Ear: External ear normal.  Left Ear: External ear normal.  Nose: Nose normal.  Mouth/Throat: Oropharynx is clear and moist.  Eyes: Conjunctivae are normal. Pupils are equal, round, and reactive to light. Right eye exhibits no discharge. Left eye exhibits no discharge.  Neck: Normal range of motion. Neck supple. No JVD present. No tracheal deviation present. No thyromegaly present.  Cardiovascular: Normal rate, regular rhythm and normal heart sounds.   Pulmonary/Chest: No stridor. No respiratory distress. She has no wheezes.  Abdominal: Soft. Bowel sounds are normal. She exhibits no distension and no mass. There is no tenderness. There is no rebound and no guarding.    Musculoskeletal: She exhibits no edema and no tenderness.  Lymphadenopathy:    She has no cervical adenopathy.  Neurological: She displays normal reflexes. No cranial nerve deficit. She exhibits normal muscle tone. Coordination normal.  Skin: No rash noted. No erythema.  Psychiatric: Her behavior is normal. Judgment and thought content normal.       sad          Assessment & Plan:

## 2010-11-06 ENCOUNTER — Encounter: Payer: Self-pay | Admitting: Internal Medicine

## 2010-11-06 NOTE — Assessment & Plan Note (Signed)
On Rx 

## 2010-11-06 NOTE — Assessment & Plan Note (Signed)
Discussed See meds 

## 2010-11-06 NOTE — Assessment & Plan Note (Signed)
Meds were adjusted

## 2010-12-24 ENCOUNTER — Telehealth: Payer: Self-pay | Admitting: *Deleted

## 2010-12-24 DIAGNOSIS — K121 Other forms of stomatitis: Secondary | ICD-10-CM

## 2010-12-24 NOTE — Telephone Encounter (Signed)
Pt left vm - She feels that she has thrush in her mouth and esophagus but says Dr Posey Rea does not agree w/her. She is req lab orders to check saliva.

## 2010-12-24 NOTE — Telephone Encounter (Signed)
OK tongue wet prep Thx

## 2010-12-25 NOTE — Telephone Encounter (Signed)
Patient informed and will come in next week for nurse visit to collect specimen.

## 2010-12-25 NOTE — Telephone Encounter (Signed)
Addended by: Vernie Murders on: 12/25/2010 11:38 AM   Modules accepted: Orders

## 2010-12-29 ENCOUNTER — Telehealth: Payer: Self-pay | Admitting: *Deleted

## 2010-12-29 ENCOUNTER — Ambulatory Visit: Payer: Medicare Other | Admitting: *Deleted

## 2010-12-29 ENCOUNTER — Other Ambulatory Visit: Payer: Medicare Other

## 2010-12-29 ENCOUNTER — Other Ambulatory Visit: Payer: Self-pay | Admitting: Internal Medicine

## 2010-12-29 DIAGNOSIS — B370 Candidal stomatitis: Secondary | ICD-10-CM

## 2010-12-29 LAB — WET PREP, GENITAL
Clue Cells Wet Prep HPF POC: NONE SEEN
WBC, Wet Prep HPF POC: NONE SEEN

## 2010-12-29 MED ORDER — NYSTATIN 100000 UNIT/ML MT SUSP: 500000.0000 [IU] | Freq: Four times a day (QID) | OROMUCOSAL | Status: DC

## 2010-12-29 NOTE — Telephone Encounter (Signed)
OK Nystatin - done. Noted. Thx

## 2010-12-29 NOTE — Progress Notes (Signed)
Swabbed pt's mouth and back of throat, prepared wet prep specimen and transported to the lab.

## 2010-12-29 NOTE — Telephone Encounter (Signed)
Wet prep was negative.

## 2010-12-29 NOTE — Telephone Encounter (Signed)
Pt stopped by for nurse visit to collect wet prep specimen to check for thrush. She says she has large amounts of mucus and saliva daily - "can't leave the house b/c of this". She did drink a small amount of vinegar in front of me to show how the saliva & mucus comes up - spit up clear mucus and said this has become serious issue in her daily life. She wants rx for liquid nystatin, results of swab are pending.

## 2010-12-30 NOTE — Telephone Encounter (Signed)
Patient informed. 

## 2011-01-09 ENCOUNTER — Encounter: Payer: Self-pay | Admitting: Internal Medicine

## 2011-01-09 ENCOUNTER — Other Ambulatory Visit (INDEPENDENT_AMBULATORY_CARE_PROVIDER_SITE_OTHER): Payer: Medicare Other

## 2011-01-09 ENCOUNTER — Ambulatory Visit (INDEPENDENT_AMBULATORY_CARE_PROVIDER_SITE_OTHER): Payer: Medicare Other | Admitting: Internal Medicine

## 2011-01-09 VITALS — BP 142/70 | HR 80 | Temp 98.3°F | Resp 16 | Wt 156.0 lb

## 2011-01-09 DIAGNOSIS — E538 Deficiency of other specified B group vitamins: Secondary | ICD-10-CM

## 2011-01-09 DIAGNOSIS — K209 Esophagitis, unspecified without bleeding: Secondary | ICD-10-CM

## 2011-01-09 DIAGNOSIS — F329 Major depressive disorder, single episode, unspecified: Secondary | ICD-10-CM

## 2011-01-09 DIAGNOSIS — IMO0001 Reserved for inherently not codable concepts without codable children: Secondary | ICD-10-CM

## 2011-01-09 DIAGNOSIS — B370 Candidal stomatitis: Secondary | ICD-10-CM

## 2011-01-09 DIAGNOSIS — K219 Gastro-esophageal reflux disease without esophagitis: Secondary | ICD-10-CM

## 2011-01-09 DIAGNOSIS — G4700 Insomnia, unspecified: Secondary | ICD-10-CM

## 2011-01-09 LAB — CBC WITH DIFFERENTIAL/PLATELET
Basophils Absolute: 0 10*3/uL (ref 0.0–0.1)
Basophils Relative: 0.5 % (ref 0.0–3.0)
Hemoglobin: 12.9 g/dL (ref 12.0–15.0)
Lymphocytes Relative: 35.7 % (ref 12.0–46.0)
Monocytes Relative: 10.4 % (ref 3.0–12.0)
Neutro Abs: 1.9 10*3/uL (ref 1.4–7.7)
RBC: 3.99 Mil/uL (ref 3.87–5.11)
RDW: 13.8 % (ref 11.5–14.6)

## 2011-01-09 LAB — COMPREHENSIVE METABOLIC PANEL
BUN: 20 mg/dL (ref 6–23)
CO2: 31 mEq/L (ref 19–32)
Calcium: 9.6 mg/dL (ref 8.4–10.5)
Chloride: 106 mEq/L (ref 96–112)
Creatinine, Ser: 1 mg/dL (ref 0.4–1.2)
GFR: 58.18 mL/min — ABNORMAL LOW (ref >60.00)
Glucose, Bld: 96 mg/dL (ref 70–99)

## 2011-01-09 MED ORDER — CYANOCOBALAMIN 1000 MCG/ML IJ SOLN
1000.0000 ug | Freq: Once | INTRAMUSCULAR | Status: AC
  Administered 2011-01-09: 1000 ug via INTRAMUSCULAR

## 2011-01-09 MED ORDER — KETOCONAZOLE 2 % EX CREA: TOPICAL_CREAM | CUTANEOUS | Status: DC

## 2011-01-09 MED ORDER — OXYCODONE HCL 10 MG PO TB12: 10.0000 mg | ORAL_TABLET | Freq: Every day | ORAL | Status: DC | PRN

## 2011-01-09 MED ORDER — NYSTATIN 100000 UNIT/ML MT SUSP: 500000.0000 [IU] | Freq: Four times a day (QID) | OROMUCOSAL | Status: AC

## 2011-01-09 NOTE — Assessment & Plan Note (Signed)
Continue with current prescription therapy as reflected on the Med list. B12 IM today

## 2011-01-09 NOTE — Progress Notes (Signed)
  Subjective:    Patient ID: Diane Abbott, female    DOB: Jun 27, 1934, 76 y.o.   MRN: [id number]  HPI   The patient presents for a follow-up of  chronic hypertension, chronic dyslipidemia, type 2 diabetes controlled with medicines  C/o thrush and a lot of saliva d/c x 1 mo - not better. Review of Systems  Constitutional: Positive for fatigue. Negative for chills, activity change, appetite change and unexpected weight change.  HENT: Positive for sore throat, drooling and trouble swallowing. Negative for congestion, mouth sores and sinus pressure.   Eyes: Negative for visual disturbance.  Respiratory: Negative for cough and chest tightness.   Gastrointestinal: Negative for nausea, abdominal pain and blood in stool.  Genitourinary: Negative for frequency, difficulty urinating and vaginal pain.  Musculoskeletal: Positive for myalgias, back pain and arthralgias. Negative for gait problem.  Skin: Negative for pallor and rash.  Neurological: Negative for dizziness, tremors, weakness, numbness and headaches.  Psychiatric/Behavioral: Negative for confusion and sleep disturbance. The patient is nervous/anxious.        Objective:   Physical Exam  Constitutional: She appears well-developed and well-nourished. No distress.  HENT:  Head: Normocephalic.  Right Ear: External ear normal.  Left Ear: External ear normal.  Nose: Nose normal.  Mouth/Throat: Oropharynx is clear and moist.  Eyes: Conjunctivae are normal. Pupils are equal, round, and reactive to light. Right eye exhibits no discharge. Left eye exhibits no discharge.       No thrush  Neck: Normal range of motion. Neck supple. No JVD present. No tracheal deviation present. No thyromegaly present.  Cardiovascular: Normal rate, regular rhythm and normal heart sounds.   Pulmonary/Chest: No stridor. No respiratory distress. She has no wheezes.  Abdominal: Soft. Bowel sounds are normal. She exhibits no distension and no mass. There is no  tenderness. There is no rebound and no guarding.  Musculoskeletal: She exhibits tenderness. She exhibits no edema.  Lymphadenopathy:    She has no cervical adenopathy.  Neurological: She displays normal reflexes. No cranial nerve deficit. She exhibits normal muscle tone. Coordination normal.  Skin: No rash noted. No erythema.  Psychiatric: Her behavior is normal. Judgment and thought content normal.       subdued   Rash under breasts B       Assessment & Plan:

## 2011-01-09 NOTE — Assessment & Plan Note (Signed)
Continue with current prescription therapy as reflected on the Med list.  

## 2011-01-09 NOTE — Assessment & Plan Note (Signed)
Restart Nexium.

## 2011-01-09 NOTE — Assessment & Plan Note (Signed)
She is convinced that she has yeast. Will cont w/Nystatin - it helped before.Marland KitchenMarland Kitchen

## 2011-01-09 NOTE — Assessment & Plan Note (Signed)
Chronic FMS/CTD  Potential benefits of a long term opioids use as well as potential risks (i.e. addiction risk, apnea etc) and complications (i.e. Somnolence, constipation and others) were explained to the patient and were aknowledged.

## 2011-01-09 NOTE — Assessment & Plan Note (Signed)
Severe  Potential benefits of a long term benzodiazepines  use as well as potential risks  and complications were explained to the patient and were aknowledged.

## 2011-01-09 NOTE — Assessment & Plan Note (Addendum)
GI consult Dr Juanda Chance Restart Nexium

## 2011-01-20 ENCOUNTER — Telehealth: Payer: Self-pay | Admitting: Internal Medicine

## 2011-01-20 NOTE — Telephone Encounter (Signed)
Left a message for Stanton Kidney to call me.

## 2011-01-21 NOTE — Telephone Encounter (Signed)
Spoke with Stanton Kidney and scheduled patient on 01/30/11 at 2:45 PM with Dr. Juanda Chance.

## 2011-01-26 LAB — DIFFERENTIAL
Basophils Absolute: 0
Basophils Relative: 1
Eosinophils Absolute: 0.2
Monocytes Relative: 10
Neutro Abs: 2.4
Neutrophils Relative %: 46

## 2011-01-26 LAB — CBC
HCT: 33.4 — ABNORMAL LOW
Hemoglobin: 11.7 — ABNORMAL LOW
RBC: 3.57 — ABNORMAL LOW
RDW: 12.9

## 2011-01-26 LAB — COMPREHENSIVE METABOLIC PANEL
ALT: 14
Alkaline Phosphatase: 48
BUN: 19
CO2: 27
Chloride: 101
GFR calc non Af Amer: 47 — ABNORMAL LOW
Glucose, Bld: 101 — ABNORMAL HIGH
Potassium: 3.5
Sodium: 140
Total Bilirubin: 0.5
Total Protein: 6.2

## 2011-01-30 ENCOUNTER — Ambulatory Visit: Payer: Medicare Other | Admitting: Internal Medicine

## 2011-02-04 LAB — URINALYSIS, ROUTINE W REFLEX MICROSCOPIC
Leukocytes, UA: NEGATIVE
Nitrite: NEGATIVE
Specific Gravity, Urine: 1.016
Urobilinogen, UA: 0.2

## 2011-02-04 LAB — CBC
HCT: 36.1
Hemoglobin: 10.8 — ABNORMAL LOW
Hemoglobin: 11.2 — ABNORMAL LOW
MCHC: 33.7
MCHC: 34.1
MCV: 96.8
Platelets: 114 — ABNORMAL LOW
Platelets: 131 — ABNORMAL LOW
RBC: 3.28 — ABNORMAL LOW
RBC: 3.44 — ABNORMAL LOW
RDW: 13.6
RDW: 13.7
RDW: 13.9
WBC: 3.7 — ABNORMAL LOW
WBC: 4.8

## 2011-02-04 LAB — D-DIMER, QUANTITATIVE: D-Dimer, Quant: >20 — ABNORMAL HIGH

## 2011-02-04 LAB — BASIC METABOLIC PANEL
Calcium: 8.8
Chloride: 104
Creatinine, Ser: 0.95
GFR calc Af Amer: >60
Sodium: 140

## 2011-02-04 LAB — DIFFERENTIAL
Basophils Absolute: 0
Basophils Absolute: 0
Basophils Absolute: 0
Basophils Relative: 1
Basophils Relative: 1
Eosinophils Absolute: 0.1
Eosinophils Relative: 3
Eosinophils Relative: 4
Lymphocytes Relative: 34
Lymphs Abs: 1.6
Monocytes Absolute: 0.3
Monocytes Relative: 8
Neutro Abs: 1.2 — ABNORMAL LOW
Neutrophils Relative %: 41 — ABNORMAL LOW
Neutrophils Relative %: 53

## 2011-02-04 LAB — PROTIME-INR
INR: 1
INR: 1
INR: 1.1
Prothrombin Time: 13.4
Prothrombin Time: 13.4

## 2011-02-04 LAB — URINE MICROSCOPIC-ADD ON

## 2011-02-04 LAB — POCT I-STAT, CHEM 8
Calcium, Ion: 1.18
Chloride: 104
HCT: 36
Sodium: 138
TCO2: 27

## 2011-02-13 ENCOUNTER — Telehealth: Payer: Self-pay | Admitting: *Deleted

## 2011-02-13 NOTE — Telephone Encounter (Signed)
Left detailed mess informing pharmacy of below.  

## 2011-02-13 NOTE — Telephone Encounter (Signed)
Rec fax stating Vagifem 25 mcg is d/c by manufact. Is 10 mcg ok to dispense? If so, please advise sig/quantity.

## 2011-02-13 NOTE — Telephone Encounter (Signed)
OK. Thx

## 2011-02-17 ENCOUNTER — Encounter (HOSPITAL_BASED_OUTPATIENT_CLINIC_OR_DEPARTMENT_OTHER): Payer: Medicare Other | Admitting: Oncology

## 2011-02-17 ENCOUNTER — Other Ambulatory Visit: Payer: Self-pay | Admitting: Physician Assistant

## 2011-02-17 DIAGNOSIS — Z86718 Personal history of other venous thrombosis and embolism: Secondary | ICD-10-CM

## 2011-02-17 DIAGNOSIS — M069 Rheumatoid arthritis, unspecified: Secondary | ICD-10-CM

## 2011-02-17 DIAGNOSIS — Z7901 Long term (current) use of anticoagulants: Secondary | ICD-10-CM

## 2011-02-17 LAB — PROTIME-INR: Protime: 10.8 Seconds (ref 10.6–13.4)

## 2011-02-17 LAB — COMPREHENSIVE METABOLIC PANEL
Albumin: 4.2 g/dL (ref 3.5–5.2)
Alkaline Phosphatase: 50 U/L (ref 39–117)
BUN: 33 mg/dL — ABNORMAL HIGH (ref 6–23)
Glucose, Bld: 107 mg/dL — ABNORMAL HIGH (ref 70–99)
Total Bilirubin: 0.5 mg/dL (ref 0.3–1.2)

## 2011-02-17 LAB — CBC WITH DIFFERENTIAL/PLATELET
Basophils Absolute: 0 10*3/uL (ref 0.0–0.1)
Eosinophils Absolute: 0.1 10*3/uL (ref 0.0–0.5)
HGB: 13.3 g/dL (ref 11.6–15.9)
LYMPH%: 32.3 % (ref 14.0–49.7)
MCV: 97.3 fL (ref 79.5–101.0)
MONO%: 8.3 % (ref 0.0–14.0)
NEUT#: 2 10*3/uL (ref 1.5–6.5)
Platelets: 160 10*3/uL (ref 145–400)

## 2011-02-18 LAB — APTT: aPTT: 27

## 2011-02-18 LAB — PROTIME-INR: INR: 1

## 2011-02-19 LAB — CBC
Hemoglobin: 11.2 — ABNORMAL LOW
MCHC: 33.2
MCHC: 34.1
MCV: 98.4
Platelets: 226
RBC: 3.48 — ABNORMAL LOW
RDW: 22.4 — ABNORMAL HIGH

## 2011-02-19 LAB — PROTIME-INR
INR: 1.3
Prothrombin Time: 16.5 — ABNORMAL HIGH

## 2011-02-26 ENCOUNTER — Encounter: Payer: Self-pay | Admitting: *Deleted

## 2011-03-04 ENCOUNTER — Encounter: Payer: Self-pay | Admitting: Internal Medicine

## 2011-03-04 ENCOUNTER — Ambulatory Visit (INDEPENDENT_AMBULATORY_CARE_PROVIDER_SITE_OTHER): Payer: Medicare Other | Admitting: Internal Medicine

## 2011-03-04 DIAGNOSIS — R1319 Other dysphagia: Secondary | ICD-10-CM

## 2011-03-04 DIAGNOSIS — K219 Gastro-esophageal reflux disease without esophagitis: Secondary | ICD-10-CM

## 2011-03-04 NOTE — Patient Instructions (Signed)
You have been scheduled for an endoscopy. Please follow written instructions given to you at your visit today. You have been scheduled for a Barium Esophogram at [facility] (1st floor of the hospital) on 03/11/11 at 9:30 am. Please arrive 15 minutes prior to your appointment for registration. Make certain not to have anything to eat or drink 6 hours prior to your test. If you need to reschedule for any reason, please contact radiology at [phone] to do so. CC: Dr Posey Rea

## 2011-03-04 NOTE — Progress Notes (Signed)
Diane Abbott 12/28/1934 MRN [id number]   History of Present Illness:  This is a 76 year old, white female with irritable bowel syndrome, fibromyalgia and rheumatoid arthritis treated with Enbrel. She has a history of deep venous thromboses and history of pulmonary emboli in 2002. She now has history of recent thrush and increased salivation. She denies dysphagia but has some odynophagia. She has taken Diflucan 200 mg a day for 2 months. She is currently much improved but still not well. She comes in the office salivating into a napkin. An upper endoscopy in September 2011 showed bile reflux gastritis. Her colonoscopy at that time was normal. She is on Nexium 40 mg a day.   Past Medical History  Diagnosis Date  . Fibromyalgia   . Pulmonary embolism   . RA (rheumatoid arthritis)   . Vitamin B 12 deficiency   . Vitamin D deficiency   . Osteoarthritis   . Osteopenia   . Adrenal insufficiency   . IBS (irritable bowel syndrome)   . History of blood clots 2008    below knee  . Diabetes mellitus type II   . Esophagitis   . Depression   . GERD (gastroesophageal reflux disease)   . Connective tissue disorder    Past Surgical History  Procedure Date  . Abdominal hysterectomy   . Total knee arthroplasty     bilateral  . Cholecystectomy 2003  . Tonsillectomy 1941  . Appendectomy 1995  . Wrist surgery     Right  . Bilateral oophorectomy   . Tummy tuck   . Cosmetic surgery     reports that she has never smoked. She has never used smokeless tobacco. She reports that she drinks alcohol. She reports that she does not use illicit drugs. family history includes Breast cancer in an unspecified family member; COPD in her father; Clotting disorder in an unspecified family member; Diabetes in her mother; Heart disease in her maternal grandmother; Ovarian cancer in her mother; and Pancreatic cancer in an unspecified family member.  There is no history of Colon cancer. Allergies  Allergen  Reactions  . Alprazolam   . Benzodiazepines   . Clonazepam   . Codeine   . Darvocet (Propoxyphene N-Acetaminophen)   . Duloxetine     REACTION: achy legs  . Gabapentin   . Morphine And Related   . Nortriptyline Hcl     REACTION: HA  . Penicillins     REACTION: Can take Cephalosporins  . Pramipexole Dihydrochloride     REACTION: sick, falling  . Prednisone   . Rofecoxib   . Sulfonamide Derivatives   . Venlafaxine   . Voltaren         Review of negative for change in bowel habits rectal bleeding or abdominal pain  The remainder of the 10 point ROS is negative except as outlined in H&P   Physical Exam: General appearance  Well developed, in no distress. Eyes- non icteric. HEENT nontraumatic, normocephalic. Mouth no lesions, tongue papillated, no cheilosis. No thrush or ulcerations Neck supple without adenopathy, thyroid not enlarged, no carotid bruits, no JVD. Lungs Clear to auscultation bilaterally. Cor normal S1, normal S2, regular rhythm, no murmur,  quiet precordium. Abdomen: Soft mildly tender in epigastrium. Normal active bowel sounds. No distention. Rectal: Not done. Extremities no pedal edema. Skin no lesions. Neurological alert and oriented x 3. Psychological normal mood and affect.  Assessment and Plan:  Problem #1 Recent onset of odynophagia and history of thrush due to antibiotics. She is improved  after a prolonged course of Diflucan. Her symptoms are also suggestive of esophageal dysmotility. We will proceed with an upper endoscopy and brushings to rule out infectious esophagitis. We will also obtain a barium esophagram to assess esophageal motility. Her hypersalivation is usually controlled with anti-cholinergics such as Robinul.   03/04/2011 Diane Abbott

## 2011-03-05 ENCOUNTER — Ambulatory Visit (AMBULATORY_SURGERY_CENTER): Payer: Medicare Other | Admitting: Internal Medicine

## 2011-03-05 ENCOUNTER — Encounter: Payer: Self-pay | Admitting: Internal Medicine

## 2011-03-05 ENCOUNTER — Other Ambulatory Visit (HOSPITAL_COMMUNITY)
Admission: RE | Admit: 2011-03-05 | Discharge: 2011-03-05 | Disposition: A | Discharge status: Home / Self Care | Payer: Medicare Other | Source: Ambulatory Visit | Attending: Internal Medicine | Admitting: Internal Medicine

## 2011-03-05 DIAGNOSIS — R1013 Epigastric pain: Secondary | ICD-10-CM

## 2011-03-05 DIAGNOSIS — R1319 Other dysphagia: Secondary | ICD-10-CM

## 2011-03-05 DIAGNOSIS — K219 Gastro-esophageal reflux disease without esophagitis: Secondary | ICD-10-CM | POA: Insufficient documentation

## 2011-03-05 DIAGNOSIS — K2970 Gastritis, unspecified, without bleeding: Secondary | ICD-10-CM

## 2011-03-05 DIAGNOSIS — R109 Unspecified abdominal pain: Secondary | ICD-10-CM | POA: Insufficient documentation

## 2011-03-05 DIAGNOSIS — R1012 Left upper quadrant pain: Secondary | ICD-10-CM

## 2011-03-05 MED ORDER — SUCRALFATE 1 GM/10ML PO SUSP: 1.0000 g | Freq: Two times a day (BID) | ORAL | Status: DC

## 2011-03-05 MED ORDER — SODIUM CHLORIDE 0.9 % IV SOLN: 500.0000 mL | INTRAVENOUS | Status: DC

## 2011-03-06 ENCOUNTER — Telehealth: Payer: Self-pay | Admitting: *Deleted

## 2011-03-06 NOTE — Telephone Encounter (Signed)
No answer, no ID on answering machine. No message left. 

## 2011-03-09 DIAGNOSIS — K589 Irritable bowel syndrome without diarrhea: Secondary | ICD-10-CM

## 2011-03-09 DIAGNOSIS — K2970 Gastritis, unspecified, without bleeding: Secondary | ICD-10-CM

## 2011-03-09 DIAGNOSIS — R1012 Left upper quadrant pain: Secondary | ICD-10-CM

## 2011-03-09 DIAGNOSIS — K2990 Gastroduodenitis, unspecified, without bleeding: Secondary | ICD-10-CM

## 2011-03-09 DIAGNOSIS — R109 Unspecified abdominal pain: Secondary | ICD-10-CM

## 2011-03-10 ENCOUNTER — Telehealth: Payer: Self-pay | Admitting: *Deleted

## 2011-03-10 NOTE — Telephone Encounter (Signed)
Message copied by Daphine Deutscher on Tue Mar 10, 2011 11:13 AM ------      Message from: Hart Carwin      Created: Mon Mar 09, 2011 10:06 PM       Please call pt with negative CLO test from EGD.

## 2011-03-10 NOTE — Telephone Encounter (Signed)
Patient given results as per Dr. Brodie 

## 2011-03-11 ENCOUNTER — Ambulatory Visit (HOSPITAL_COMMUNITY)
Admission: RE | Admit: 2011-03-11 | Discharge: 2011-03-11 | Disposition: A | Discharge status: Home / Self Care | Payer: Medicare Other | Source: Ambulatory Visit | Attending: Internal Medicine | Admitting: Internal Medicine

## 2011-03-11 DIAGNOSIS — K449 Diaphragmatic hernia without obstruction or gangrene: Secondary | ICD-10-CM | POA: Insufficient documentation

## 2011-03-11 DIAGNOSIS — K219 Gastro-esophageal reflux disease without esophagitis: Secondary | ICD-10-CM

## 2011-03-11 DIAGNOSIS — M629 Disorder of muscle, unspecified: Secondary | ICD-10-CM | POA: Insufficient documentation

## 2011-03-11 DIAGNOSIS — M2420 Disorder of ligament, unspecified site: Secondary | ICD-10-CM | POA: Insufficient documentation

## 2011-03-11 DIAGNOSIS — R1310 Dysphagia, unspecified: Secondary | ICD-10-CM | POA: Insufficient documentation

## 2011-03-12 ENCOUNTER — Encounter: Payer: Self-pay | Admitting: Internal Medicine

## 2011-03-12 ENCOUNTER — Ambulatory Visit (INDEPENDENT_AMBULATORY_CARE_PROVIDER_SITE_OTHER): Payer: Medicare Other | Admitting: Internal Medicine

## 2011-03-12 ENCOUNTER — Telehealth: Payer: Self-pay | Admitting: *Deleted

## 2011-03-12 VITALS — BP 140/60 | HR 88 | Temp 98.1°F | Resp 16 | Wt 161.0 lb

## 2011-03-12 DIAGNOSIS — F329 Major depressive disorder, single episode, unspecified: Secondary | ICD-10-CM

## 2011-03-12 DIAGNOSIS — J449 Chronic obstructive pulmonary disease, unspecified: Secondary | ICD-10-CM

## 2011-03-12 DIAGNOSIS — IMO0001 Reserved for inherently not codable concepts without codable children: Secondary | ICD-10-CM

## 2011-03-12 DIAGNOSIS — E559 Vitamin D deficiency, unspecified: Secondary | ICD-10-CM

## 2011-03-12 DIAGNOSIS — B370 Candidal stomatitis: Secondary | ICD-10-CM

## 2011-03-12 MED ORDER — OXYCODONE HCL 10 MG PO TB12: 10.0000 mg | ORAL_TABLET | Freq: Two times a day (BID) | ORAL | Status: DC | PRN

## 2011-03-12 MED ORDER — IPRATROPIUM-ALBUTEROL 20-100 MCG/ACT IN AERS: 2.0000 | INHALATION_SPRAY | Freq: Three times a day (TID) | RESPIRATORY_TRACT | Status: DC

## 2011-03-12 NOTE — Assessment & Plan Note (Signed)
Worse 10/12 Start Combivent Respimat

## 2011-03-12 NOTE — Assessment & Plan Note (Signed)
Continue with current prescription therapy as reflected on the Med list.  

## 2011-03-12 NOTE — Telephone Encounter (Signed)
Spoke with patient and gave her the results as per Dr. Brodie. 

## 2011-03-12 NOTE — Telephone Encounter (Signed)
Message copied by Daphine Deutscher on Thu Mar 12, 2011 10:29 AM ------      Message from: Hart Carwin      Created: Wed Mar 11, 2011  5:13 PM       Please call pt with results of Ba esophagram which does not show any  Disruption of the motility or stricture. 13 mm tablet passed without difficulty.

## 2011-03-12 NOTE — Progress Notes (Signed)
  Subjective:    Patient ID: Diane Abbott, female    DOB: 1934-06-20, 76 y.o.   MRN: [id number]  HPI   The patient is here to follow up on chronic depression, anxiety, headaches and chronic moderate LBP and fibromyalgia symptoms controlled with medicines, diet and exercise. C/o SOB and rattling, mucus - h/o COPD  Review of Systems  Constitutional: Negative for chills, activity change, appetite change, fatigue and unexpected weight change.  HENT: Positive for sinus pressure. Negative for congestion and mouth sores.   Eyes: Negative for visual disturbance.  Respiratory: Positive for cough, shortness of breath and wheezing. Negative for chest tightness.   Gastrointestinal: Negative for nausea and abdominal pain.  Genitourinary: Negative for frequency, difficulty urinating and vaginal pain.  Musculoskeletal: Positive for myalgias, back pain and arthralgias. Negative for gait problem.  Skin: Negative for pallor and rash.  Neurological: Negative for dizziness, tremors, weakness, numbness and headaches.  Psychiatric/Behavioral: Negative for confusion and sleep disturbance.       Objective:   Physical Exam  Constitutional: She appears well-developed and well-nourished. No distress.  HENT:  Head: Normocephalic.  Right Ear: External ear normal.  Left Ear: External ear normal.  Nose: Nose normal.  Mouth/Throat: Oropharynx is clear and moist.  Eyes: Conjunctivae are normal. Pupils are equal, round, and reactive to light. Right eye exhibits no discharge. Left eye exhibits no discharge.  Neck: Normal range of motion. Neck supple. No JVD present. No tracheal deviation present. No thyromegaly present.  Cardiovascular: Normal rate, regular rhythm and normal heart sounds.   Pulmonary/Chest: No stridor. No respiratory distress. She has no wheezes.  Abdominal: Soft. Bowel sounds are normal. She exhibits no distension and no mass. There is no tenderness. There is no rebound and no guarding.    Musculoskeletal: She exhibits tenderness. She exhibits no edema.  Lymphadenopathy:    She has no cervical adenopathy.  Neurological: She displays normal reflexes. No cranial nerve deficit. She exhibits normal muscle tone. Coordination normal.  Skin: No rash noted. No erythema.  Psychiatric: She has a normal mood and affect. Her behavior is normal. Judgment and thought content normal.       sad    I personally provided the inhaler Respimat use teaching. After the teaching patient was able to demonstrate it's use effectively. All questions were answered       Assessment & Plan:

## 2011-03-12 NOTE — Telephone Encounter (Signed)
Left a message for patient to call me. 

## 2011-03-12 NOTE — Assessment & Plan Note (Signed)
Chronic FMS/CTD, severe Meds refilled

## 2011-03-12 NOTE — Assessment & Plan Note (Signed)
Resolved

## 2011-03-31 ENCOUNTER — Telehealth: Payer: Self-pay | Admitting: *Deleted

## 2011-03-31 NOTE — Telephone Encounter (Signed)
Pt called and stated she has a lump in her breast.  Dr Donnie Coffin notified and she is to see her PCP   Msg left stating the above with patient to return call for questions or concerns

## 2011-04-01 NOTE — Progress Notes (Signed)
CC:   Georgina Quint. Plotnikov, MD Ollen Gross, M.D.  PROBLEM LIST:  History of hypercoaguable state on chronic Lovenox with history of mild chronic anemia.  INTERVAL HISTORY:  Kienna returns for followup.  She is doing reasonably well.  She has a lot of financial stress, is helping her daughter out.  There have been a lot of issues with her not taking her Lovenox in a timely fashion.  Because of her financial issues, she has not been taking her Lovenox as before.  Her leg pain seems to be much better.  She denies any hot flashes.  She denies any headaches, blurry vision, shortness of breath or cough.  ECOG STATUS:  0.  MEDICATIONS:  List was reviewed.  She is still on OxyContin, Requip, Aleve and Lovenox.  PHYSICAL EXAMINATION:  Pleasant, alert woman looking stated age.  Vital signs:  Blood pressure 142/70, temperature 98.3, pulse 80, respiratory rate 20, weight 161.4.  Head and neck:  Unremarkable.  Trachea midline. No thyromegaly.  Extraocular movements are normal.  Lungs:  Clear. Heart:  Sounds normal.  Breasts:  Free of any masses.  No nipple retraction or skin changes.  Both axilla negative.  There is no palpable hepatosplenomegaly.  No inguinal adenopathy.  No peripheral cyanosis, clubbing or edema.  Neurologic:  Grossly intact.  LABORATORY DATA:  From 10/16 are within normal limits.  We do not have a __________level.  Plan to repeat one in the near future.  IMPRESSION/PLAN:  History of hypercoagulable state with multiple family members being afflicted with deep venous thrombosis and pulmonary embolism.  Once again cautioned Nicole Cella about not being compliant with her Lovenox and the consequences of that.  Will try to help her out in any way we can.  I will plan to see her again in probably 6 months.  I have encouraged her to get a mammogram and continue with her health care maintenance as before.  She did remind me that she did have a mammogram which we reviewed which was  done in July which actually was normal.    ______________________________ Pierce Crane, M.D., F.R.C.P.C. PR/MEDQ  D:  04/01/2011  T:  04/01/2011  Job:  [id number]

## 2011-04-02 ENCOUNTER — Telehealth: Payer: Self-pay | Admitting: *Deleted

## 2011-04-02 NOTE — Telephone Encounter (Signed)
FYI: Pt c/o lump in breast X1 week, scheduled OV for Tuesday 12.04.12 [first opening-refused appointment w/another MD]; Dr Donnie Coffin suggest she she PCP. Pt has Family Hx: Aunt & Cousin deceased from Breast Cancer . Do you want to see patient before next week? Please advise.

## 2011-04-02 NOTE — Telephone Encounter (Signed)
Next Mon w/me or another MD appt sooner Thx

## 2011-04-06 NOTE — Telephone Encounter (Signed)
Done

## 2011-04-07 ENCOUNTER — Encounter: Payer: Self-pay | Admitting: Internal Medicine

## 2011-04-07 ENCOUNTER — Ambulatory Visit (INDEPENDENT_AMBULATORY_CARE_PROVIDER_SITE_OTHER): Payer: Medicare Other | Admitting: Internal Medicine

## 2011-04-07 VITALS — BP 120/60 | HR 92 | Temp 98.0°F | Resp 16 | Wt 162.0 lb

## 2011-04-07 DIAGNOSIS — N63 Unspecified lump in unspecified breast: Secondary | ICD-10-CM | POA: Insufficient documentation

## 2011-04-07 MED ORDER — OXYCODONE HCL 10 MG PO TB12: 10.0000 mg | ORAL_TABLET | Freq: Two times a day (BID) | ORAL | Status: DC | PRN

## 2011-04-07 NOTE — Progress Notes (Signed)
  Subjective:    Patient ID: Diane Abbott, female    DOB: 1935-01-09, 76 y.o.   MRN: [id number]  HPI C/o painful lump in L breast 11/26 ( she had a bx benign in L breast a few years back)   Review of Systems  Constitutional: Negative for chills, activity change, appetite change, fatigue and unexpected weight change.  HENT: Negative for congestion, mouth sores and sinus pressure.   Eyes: Negative for visual disturbance.  Respiratory: Negative for cough and chest tightness.   Gastrointestinal: Negative for nausea and abdominal pain.  Genitourinary: Negative for frequency, difficulty urinating and vaginal pain.  Musculoskeletal: Negative for back pain and gait problem.  Skin: Negative for pallor and rash.  Neurological: Negative for dizziness, tremors, weakness, numbness and headaches.  Psychiatric/Behavioral: Negative for confusion and sleep disturbance.       Objective:   Physical Exam  Constitutional: She appears well-developed and well-nourished. No distress.  Cardiovascular: Normal rate.  Exam reveals no gallop.   Pulmonary/Chest: She has no wheezes. She has no rales. She exhibits no tenderness.  Abdominal: She exhibits no mass. There is no tenderness.  Skin: No rash noted. No pallor.      12/12 L breast 6x5 mm oval mass at 11:30 - about 12 cm north from the nipple, NT    Assessment & Plan:

## 2011-04-07 NOTE — Assessment & Plan Note (Signed)
3/12:   DG SCREEN MAMMOGRAM BILATERAL  Bilateral CC and MLO view(s) were taken.  Prior study comparison: Sep 01, 2005, DG screen mammogram bilateral.  DIGITAL SCREENING MAMMOGRAM WITH CAD:  Comparison: Prior studies.  The breast tissue is almost entirely fatty. There is no dominant mass, architectural distortion or  calcification to suggest malignancy.  Images were processed with CAD.  IMPRESSION:  No mammographic evidence of malignancy. Suggest yearly screening mammography.  A result letter of this screening mammogram will be mailed directly to the patient.  ASSESSMENT: Negative - BI-RADS 1  Screening mammogram in 1 year.

## 2011-04-08 ENCOUNTER — Encounter: Payer: Self-pay | Admitting: Internal Medicine

## 2011-04-08 ENCOUNTER — Telehealth: Payer: Self-pay | Admitting: *Deleted

## 2011-04-08 NOTE — Telephone Encounter (Signed)
left voice message to inform the patient of the new date and time of the 08-19-2011 starting at 11:00

## 2011-04-10 ENCOUNTER — Other Ambulatory Visit: Payer: Self-pay | Admitting: Internal Medicine

## 2011-04-10 DIAGNOSIS — N63 Unspecified lump in unspecified breast: Secondary | ICD-10-CM

## 2011-04-22 ENCOUNTER — Other Ambulatory Visit: Payer: Medicare Other

## 2011-05-07 ENCOUNTER — Ambulatory Visit
Admission: RE | Admit: 2011-05-07 | Discharge: 2011-05-07 | Disposition: A | Discharge status: Home / Self Care | Payer: Medicare Other | Source: Ambulatory Visit | Attending: Internal Medicine | Admitting: Internal Medicine

## 2011-05-07 DIAGNOSIS — N63 Unspecified lump in unspecified breast: Secondary | ICD-10-CM

## 2011-05-07 DIAGNOSIS — N6489 Other specified disorders of breast: Secondary | ICD-10-CM | POA: Diagnosis not present

## 2011-05-18 DIAGNOSIS — M069 Rheumatoid arthritis, unspecified: Secondary | ICD-10-CM | POA: Diagnosis not present

## 2011-05-21 ENCOUNTER — Ambulatory Visit: Payer: Medicare Other | Admitting: Internal Medicine

## 2011-06-01 DIAGNOSIS — M069 Rheumatoid arthritis, unspecified: Secondary | ICD-10-CM | POA: Diagnosis not present

## 2011-06-09 ENCOUNTER — Telehealth: Payer: Self-pay | Admitting: Oncology

## 2011-06-09 NOTE — Telephone Encounter (Signed)
Pt called and lmon my voice mail regarding a blood clot in her leg and was requesting a dopplar appt. forwred the message over to the desk nurse

## 2011-06-10 ENCOUNTER — Telehealth: Payer: Self-pay

## 2011-06-10 ENCOUNTER — Ambulatory Visit (HOSPITAL_COMMUNITY)
Admission: RE | Admit: 2011-06-10 | Discharge: 2011-06-10 | Disposition: A | Discharge status: Home / Self Care | Payer: Medicare Other | Source: Ambulatory Visit | Attending: Oncology | Admitting: Oncology

## 2011-06-10 ENCOUNTER — Other Ambulatory Visit: Payer: Self-pay

## 2011-06-10 ENCOUNTER — Telehealth: Payer: Self-pay | Admitting: *Deleted

## 2011-06-10 DIAGNOSIS — M7989 Other specified soft tissue disorders: Secondary | ICD-10-CM | POA: Insufficient documentation

## 2011-06-10 DIAGNOSIS — M79609 Pain in unspecified limb: Secondary | ICD-10-CM | POA: Diagnosis not present

## 2011-06-10 DIAGNOSIS — T148XXA Other injury of unspecified body region, initial encounter: Secondary | ICD-10-CM

## 2011-06-10 DIAGNOSIS — Z86718 Personal history of other venous thrombosis and embolism: Secondary | ICD-10-CM

## 2011-06-10 DIAGNOSIS — I2699 Other pulmonary embolism without acute cor pulmonale: Secondary | ICD-10-CM

## 2011-06-10 DIAGNOSIS — S8010XA Contusion of unspecified lower leg, initial encounter: Secondary | ICD-10-CM | POA: Insufficient documentation

## 2011-06-10 DIAGNOSIS — R609 Edema, unspecified: Secondary | ICD-10-CM

## 2011-06-10 DIAGNOSIS — X58XXXA Exposure to other specified factors, initial encounter: Secondary | ICD-10-CM | POA: Insufficient documentation

## 2011-06-10 DIAGNOSIS — I82409 Acute embolism and thrombosis of unspecified deep veins of unspecified lower extremity: Secondary | ICD-10-CM

## 2011-06-10 NOTE — Telephone Encounter (Signed)
Call received asking for a copy of an order as patient will be arriving soon and Dawn will enter the order.  Jacki Cones with scheduling notified and will fax order to [facility] at [phone].

## 2011-06-10 NOTE — Telephone Encounter (Signed)
Received message from [employer], scheduler reporting that pt had called requesting a doppler appt for "a superficial blood clot."  Contacted pt for more information.  Per pt, she "bruised my leg about a week ago."  Reports redness around the bruising and "2 knots; 1 the size of a tea cup and 1 the size of a silver dollar."  Denies temperature difference at this sites.  Reports "deep pain in my calf like when I had the clot that went from my ankle to my hip."   Pt admits to being somewhat sporadic in using her Lovenox.  She is prescribed Lovenox 120mg  daily, but states "I'm using my daughter's prescription of 100mg  and those shots are over 45 years old."  Pt's reasoning is financial and she is aware of the risks of not using Lovenox as prescribed.  She is aware that she can apply for financial assistance, but states "I just don't know if I can handle being rejected again."  Encouraged pt to see assistance ASAP.  Doppler of LLE scheduled with Dawn in vascular lab per Debbora Presto, PA-C.  Pt is aware to arrive at [facility] admitting at 1245 TODAY.  Pt is also aware that she will NOT be seen in our office today.  She is comfortable with this plan and verbalizes understanding and appreciation. dph

## 2011-06-10 NOTE — Progress Notes (Signed)
Left:  No evidence of DVT, superficial thrombosis, or Baker's cyst.  Milta Deiters, [place] D., RVS 06/10/2011 1:46 PM

## 2011-06-17 DIAGNOSIS — M069 Rheumatoid arthritis, unspecified: Secondary | ICD-10-CM | POA: Diagnosis not present

## 2011-06-19 ENCOUNTER — Encounter: Payer: Self-pay | Admitting: Internal Medicine

## 2011-06-19 ENCOUNTER — Ambulatory Visit (INDEPENDENT_AMBULATORY_CARE_PROVIDER_SITE_OTHER): Payer: Medicare Other | Admitting: Internal Medicine

## 2011-06-19 DIAGNOSIS — IMO0001 Reserved for inherently not codable concepts without codable children: Secondary | ICD-10-CM | POA: Diagnosis not present

## 2011-06-19 DIAGNOSIS — F329 Major depressive disorder, single episode, unspecified: Secondary | ICD-10-CM

## 2011-06-19 DIAGNOSIS — J449 Chronic obstructive pulmonary disease, unspecified: Secondary | ICD-10-CM

## 2011-06-19 DIAGNOSIS — E538 Deficiency of other specified B group vitamins: Secondary | ICD-10-CM

## 2011-06-19 DIAGNOSIS — I82409 Acute embolism and thrombosis of unspecified deep veins of unspecified lower extremity: Secondary | ICD-10-CM

## 2011-06-19 DIAGNOSIS — F3289 Other specified depressive episodes: Secondary | ICD-10-CM

## 2011-06-19 DIAGNOSIS — K589 Irritable bowel syndrome without diarrhea: Secondary | ICD-10-CM | POA: Diagnosis not present

## 2011-06-19 DIAGNOSIS — M069 Rheumatoid arthritis, unspecified: Secondary | ICD-10-CM

## 2011-06-19 MED ORDER — IPRATROPIUM BROMIDE 0.06 % NA SOLN: 1.0000 | NASAL | Status: DC | PRN

## 2011-06-19 MED ORDER — ERYTHROMYCIN 5 MG/GM OP OINT: TOPICAL_OINTMENT | Freq: Every day | OPHTHALMIC | Status: AC

## 2011-06-19 MED ORDER — HYOSCYAMINE SULFATE 0.125 MG SL SUBL: 0.1250 mg | SUBLINGUAL_TABLET | Freq: Four times a day (QID) | SUBLINGUAL | Status: DC | PRN

## 2011-06-19 MED ORDER — OXYCODONE HCL 10 MG PO TB12: 10.0000 mg | ORAL_TABLET | Freq: Two times a day (BID) | ORAL | Status: DC | PRN

## 2011-06-19 MED ORDER — TRIAZOLAM 0.25 MG PO TABS: 0.2500 mg | ORAL_TABLET | Freq: Every evening | ORAL | Status: DC | PRN

## 2011-06-19 NOTE — Assessment & Plan Note (Signed)
Continue with current prescription therapy as reflected on the Med list.  

## 2011-06-19 NOTE — Assessment & Plan Note (Addendum)
Continue with current prescription therapy as reflected on the Med list. Not using Lovenox every day due to to $$. We will see if Xarelto is less expensive

## 2011-06-19 NOTE — Assessment & Plan Note (Signed)
Chronic FMS/CTD, severe  Potential benefits of a long term opioids use as well as potential risks (i.e. addiction risk, apnea etc) and complications (i.e. Somnolence, constipation and others) were explained to the patient and were aknowledged. 

## 2011-06-21 NOTE — Progress Notes (Signed)
Patient ID: NDEA [name], female   DOB: 1935/02/11, 77 y.o.   MRN: [id number]  Subjective:    Patient ID: Diane Abbott, female    DOB: 1935-03-01, 77 y.o.   MRN: [id number]  HPI   The patient is here to follow up on chronic depression, anxiety, headaches and chronic moderate fibromyalgia symptoms controlled with medicines, diet and exercise. She has been using Lovenox irregular due to cost...   Review of Systems  Constitutional: Negative for chills, activity change, appetite change, fatigue and unexpected weight change.  HENT: Positive for sinus pressure. Negative for congestion and mouth sores.   Eyes: Negative for visual disturbance.  Respiratory: Positive for cough, shortness of breath and wheezing. Negative for chest tightness.   Gastrointestinal: Negative for nausea and abdominal pain.  Genitourinary: Negative for frequency, difficulty urinating and vaginal pain.  Musculoskeletal: Positive for myalgias, back pain and arthralgias. Negative for gait problem.  Skin: Negative for pallor and rash.  Neurological: Negative for dizziness, tremors, weakness, numbness and headaches.  Psychiatric/Behavioral: Negative for confusion and sleep disturbance.       Objective:   Physical Exam  Constitutional: She appears well-developed and well-nourished. No distress.  HENT:  Head: Normocephalic.  Right Ear: External ear normal.  Left Ear: External ear normal.  Nose: Nose normal.  Mouth/Throat: Oropharynx is clear and moist.  Eyes: Conjunctivae are normal. Pupils are equal, round, and reactive to light. Right eye exhibits no discharge. Left eye exhibits no discharge.  Neck: Normal range of motion. Neck supple. No JVD present. No tracheal deviation present. No thyromegaly present.  Cardiovascular: Normal rate, regular rhythm and normal heart sounds.   Pulmonary/Chest: No stridor. No respiratory distress. She has no wheezes.  Abdominal: Soft. Bowel sounds are normal. She exhibits no  distension and no mass. There is no tenderness. There is no rebound and no guarding.  Musculoskeletal: She exhibits tenderness. She exhibits no edema.  Lymphadenopathy:    She has no cervical adenopathy.  Neurological: She displays normal reflexes. No cranial nerve deficit. She exhibits normal muscle tone. Coordination normal.  Skin: No rash noted. No erythema.  Psychiatric: She has a normal mood and affect. Her behavior is normal. Judgment and thought content normal.       sad          Assessment & Plan:

## 2011-06-23 ENCOUNTER — Telehealth: Payer: Self-pay | Admitting: *Deleted

## 2011-06-23 NOTE — Telephone Encounter (Signed)
Left mess for patient to call back.  

## 2011-06-23 NOTE — Telephone Encounter (Signed)
Message copied by Merrilyn Puma on Tue Jun 23, 2011  9:09 AM ------      Message from: Diane Abbott      Created: Sun Jun 21, 2011  1:26 PM       Misty Stanley, please, ask the pt to find out from her pharmacist what Xarelto will cost her      Thx

## 2011-06-24 NOTE — Telephone Encounter (Signed)
Pt left vm stating Xarelto cost for her will be $272.88 at [facility].

## 2011-06-24 NOTE — Telephone Encounter (Signed)
Left detailed mess informing pt of below.  

## 2011-06-24 NOTE — Telephone Encounter (Signed)
Noted - did she get a call from [facility]? Thx

## 2011-06-25 NOTE — Telephone Encounter (Signed)
No, pt states she has not received call from [facility].

## 2011-06-26 NOTE — Telephone Encounter (Signed)
i called [facility]

## 2011-07-01 DIAGNOSIS — M069 Rheumatoid arthritis, unspecified: Secondary | ICD-10-CM | POA: Diagnosis not present

## 2011-07-22 DIAGNOSIS — M67919 Unspecified disorder of synovium and tendon, unspecified shoulder: Secondary | ICD-10-CM | POA: Diagnosis not present

## 2011-07-28 DIAGNOSIS — H2589 Other age-related cataract: Secondary | ICD-10-CM | POA: Diagnosis not present

## 2011-07-28 DIAGNOSIS — H04129 Dry eye syndrome of unspecified lacrimal gland: Secondary | ICD-10-CM | POA: Diagnosis not present

## 2011-08-19 ENCOUNTER — Other Ambulatory Visit (HOSPITAL_BASED_OUTPATIENT_CLINIC_OR_DEPARTMENT_OTHER): Payer: Medicare Other | Admitting: Lab

## 2011-08-19 ENCOUNTER — Ambulatory Visit (HOSPITAL_BASED_OUTPATIENT_CLINIC_OR_DEPARTMENT_OTHER): Payer: Medicare Other | Admitting: Oncology

## 2011-08-19 VITALS — BP 123/65 | HR 78 | Temp 98.5°F | Ht 61.0 in | Wt 159.7 lb

## 2011-08-19 DIAGNOSIS — D6859 Other primary thrombophilia: Secondary | ICD-10-CM

## 2011-08-19 DIAGNOSIS — Z86718 Personal history of other venous thrombosis and embolism: Secondary | ICD-10-CM

## 2011-08-19 DIAGNOSIS — D649 Anemia, unspecified: Secondary | ICD-10-CM

## 2011-08-19 DIAGNOSIS — D530 Protein deficiency anemia: Secondary | ICD-10-CM | POA: Diagnosis not present

## 2011-08-19 DIAGNOSIS — E559 Vitamin D deficiency, unspecified: Secondary | ICD-10-CM | POA: Diagnosis not present

## 2011-08-19 DIAGNOSIS — E538 Deficiency of other specified B group vitamins: Secondary | ICD-10-CM

## 2011-08-19 DIAGNOSIS — C50919 Malignant neoplasm of unspecified site of unspecified female breast: Secondary | ICD-10-CM

## 2011-08-19 DIAGNOSIS — Z7901 Long term (current) use of anticoagulants: Secondary | ICD-10-CM | POA: Diagnosis not present

## 2011-08-19 LAB — CBC WITH DIFFERENTIAL/PLATELET
BASO%: 1.6 % (ref 0.0–2.0)
EOS%: 3.4 % (ref 0.0–7.0)
HCT: 39.2 % (ref 34.8–46.6)
HGB: 13.4 g/dL (ref 11.6–15.9)
MCH: 31.5 pg (ref 25.1–34.0)
MCHC: 34.2 g/dL (ref 31.5–36.0)
MONO#: 0.4 10*3/uL (ref 0.1–0.9)
NEUT%: 45.8 % (ref 38.4–76.8)
RDW: 13.2 % (ref 11.2–14.5)
WBC: 4.4 10*3/uL (ref 3.9–10.3)
lymph#: 1.8 10*3/uL (ref 0.9–3.3)

## 2011-08-20 ENCOUNTER — Other Ambulatory Visit: Payer: Medicare Other | Admitting: Lab

## 2011-08-20 LAB — COMPREHENSIVE METABOLIC PANEL
ALT: 13 U/L (ref 0–35)
AST: 17 U/L (ref 0–37)
Albumin: 3.9 g/dL (ref 3.5–5.2)
Alkaline Phosphatase: 60 U/L (ref 39–117)
BUN: 19 mg/dL (ref 6–23)
Calcium: 8.8 mg/dL (ref 8.4–10.5)
Chloride: 105 mEq/L (ref 96–112)
Potassium: 3.9 mEq/L (ref 3.5–5.3)
Sodium: 142 mEq/L (ref 135–145)
Total Protein: 6.8 g/dL (ref 6.0–8.3)

## 2011-08-20 NOTE — Progress Notes (Signed)
Hematology and Oncology Follow Up Visit  Diane Abbott [id number] February 23, 1935 77 y.o. 08/20/2011 8:06 AM PCP Dr Posey Rea Principle Diagnosis: History of hypercoagulable state with positive family history on chronic Lovenox therapy History of anemia  Interim History:  There have been no intercurrent illness, hospitalizations or medication changes. [name] is not compliant with her Lovenox because of financial issues. Her family doctor is given her samples of a oral anticoagulant likely a antithrombin inhibitor. To try. From a financial point of view this may be more expedient. She a mammogram a few months ago and a followup is recommended for this month. This has not been booked to date. There is a hypoechoic area seen on her mammogram and ultrasound.  Medications: I have reviewed the patient's current medications.  Allergies:  Allergies  Allergen Reactions  . Alprazolam   . Benzodiazepines   . Clonazepam   . Codeine   . Darvocet (Propoxacet-N)   . Duloxetine     REACTION: achy legs  . Gabapentin   . Morphine And Related   . Nortriptyline Hcl     REACTION: HA  . Penicillins     REACTION: Can take Cephalosporins  . Pramipexole Dihydrochloride     REACTION: sick, falling  . Prednisone   . Rofecoxib   . Sulfonamide Derivatives   . Venlafaxine   . Voltaren     Past Medical History, Surgical history, Social history, and Family History were reviewed and updated.  Review of Systems: Constitutional:  Negative for fever, chills, night sweats, anorexia, weight loss, pain. Cardiovascular: negative Respiratory: negative Neurological: negative Dermatological: negative ENT: negative Skin Gastrointestinal: negative Genito-Urinary: negative Hematological and Lymphatic: negative Breast: negative Musculoskeletal: negative Remaining ROS negative.  Physical Exam: Blood pressure 123/65, pulse 78, temperature 98.5 F (36.9 C), temperature source Oral, height 5\' 1"  (1.549 m), weight  159 lb 11.2 oz (72.439 kg). ECOG: 0 General appearance: alert, cooperative and appears stated age Head: Normocephalic, without obvious abnormality, atraumatic Neck: no adenopathy, no carotid bruit, no JVD, supple, symmetrical, trachea midline and thyroid not enlarged, symmetric, no tenderness/mass/nodules Lymph nodes: Cervical, supraclavicular, and axillary nodes normal. Cardiac : regular rate and rhythm, no murmurs or gallops Pulmonary:clear to auscultation bilaterally and normal percussion bilaterally Breasts: inspection negative, no nipple discharge or bleeding, no masses or nodularity palpable Abdomen:soft, non-tender; bowel sounds normal; no masses,  no organomegaly Extremities negative Neuro: alert, oriented, normal speech, no focal findings or movement disorder noted  Lab Results: Lab Results  Component Value Date   WBC 4.4 08/19/2011   HGB 13.4 08/19/2011   HCT 39.2 08/19/2011   MCV 92.2 08/19/2011   PLT 159 08/19/2011     Chemistry      Component Value Date/Time   NA 142 08/19/2011 1059   K 3.9 08/19/2011 1059   CL 105 08/19/2011 1059   CO2 28 08/19/2011 1059   BUN 19 08/19/2011 1059   CREATININE 1.19* 08/19/2011 1059      Component Value Date/Time   CALCIUM 8.8 08/19/2011 1059   ALKPHOS 60 08/19/2011 1059   AST 17 08/19/2011 1059   ALT 13 08/19/2011 1059   BILITOT 0.4 08/19/2011 1059      .pathology. Radiological Studies: chest X-ray n/a Mammogram Due 4/13 Bone density n/a  Impression and Plan: I did discuss with him within the possibility of her taking rivaroxaban, this is likely a good idea especially if this is something that she can obtain on an ongoing basis. I've scheduled her followup mammogram. I will see  her in 6 months.   More than 50% of the visit was spent in patient-related counselling   Pierce Crane, MD 4/18/20138:06 AM

## 2011-08-26 ENCOUNTER — Encounter: Payer: Self-pay | Admitting: Internal Medicine

## 2011-08-26 ENCOUNTER — Ambulatory Visit (INDEPENDENT_AMBULATORY_CARE_PROVIDER_SITE_OTHER): Payer: Medicare Other | Admitting: Internal Medicine

## 2011-08-26 VITALS — BP 136/84 | HR 84 | Temp 97.2°F | Resp 16 | Wt 162.0 lb

## 2011-08-26 DIAGNOSIS — M359 Systemic involvement of connective tissue, unspecified: Secondary | ICD-10-CM

## 2011-08-26 DIAGNOSIS — E538 Deficiency of other specified B group vitamins: Secondary | ICD-10-CM

## 2011-08-26 DIAGNOSIS — M069 Rheumatoid arthritis, unspecified: Secondary | ICD-10-CM

## 2011-08-26 DIAGNOSIS — B370 Candidal stomatitis: Secondary | ICD-10-CM

## 2011-08-26 DIAGNOSIS — I82409 Acute embolism and thrombosis of unspecified deep veins of unspecified lower extremity: Secondary | ICD-10-CM

## 2011-08-26 DIAGNOSIS — IMO0001 Reserved for inherently not codable concepts without codable children: Secondary | ICD-10-CM

## 2011-08-26 DIAGNOSIS — F329 Major depressive disorder, single episode, unspecified: Secondary | ICD-10-CM

## 2011-08-26 MED ORDER — CLOTRIMAZOLE-BETAMETHASONE 1-0.05 % EX CREA: TOPICAL_CREAM | Freq: Two times a day (BID) | CUTANEOUS | Status: DC

## 2011-08-26 MED ORDER — RIVAROXABAN 20 MG PO TABS: 20.0000 mg | ORAL_TABLET | ORAL | Status: DC

## 2011-08-26 MED ORDER — OXYCODONE HCL 10 MG PO TB12: 10.0000 mg | ORAL_TABLET | Freq: Two times a day (BID) | ORAL | Status: DC | PRN

## 2011-08-26 MED ORDER — FLUCONAZOLE 200 MG PO TABS: 200.0000 mg | ORAL_TABLET | Freq: Every day | ORAL | Status: DC

## 2011-08-26 NOTE — Assessment & Plan Note (Signed)
Diflucan po Lotrisone vag for burning

## 2011-08-26 NOTE — Assessment & Plan Note (Signed)
Rx per Dr Titus Dubin

## 2011-08-26 NOTE — Progress Notes (Signed)
Patient ID: Diane Abbott, female   DOB: 1934/10/18, 77 y.o.   MRN: [id number] Patient ID: Diane Abbott, female   DOB: 1935/04/24, 77 y.o.   MRN: [id number]  Subjective:    Patient ID: Diane Abbott, female    DOB: [birth year], 77 y.o.   MRN: [id number]  HPI   The patient is here to follow up on chronic depression, anxiety, headaches and chronic moderate fibromyalgia symptoms controlled with medicines, diet and exercise. She has been using Lovenox irregular due to cost... Xarelto is less $$: $450 vs $4500/mo  Wt Readings from Last 3 Encounters:  08/26/11 162 lb (73.483 kg)  08/19/11 159 lb 11.2 oz (72.439 kg)  06/19/11 164 lb (74.39 kg)   BP Readings from Last 3 Encounters:  08/26/11 136/84  08/19/11 123/65  06/19/11 148/70     Review of Systems  Constitutional: Negative for chills, activity change, appetite change, fatigue and unexpected weight change.  HENT: Positive for sinus pressure. Negative for congestion and mouth sores.   Eyes: Negative for visual disturbance.  Respiratory: Positive for cough, shortness of breath and wheezing. Negative for chest tightness.   Gastrointestinal: Negative for nausea and abdominal pain.  Genitourinary: Negative for frequency, difficulty urinating and vaginal pain.  Musculoskeletal: Positive for myalgias, back pain and arthralgias. Negative for gait problem.  Skin: Negative for pallor and rash.  Neurological: Negative for dizziness, tremors, weakness, numbness and headaches.  Psychiatric/Behavioral: Negative for confusion and sleep disturbance.       Objective:   Physical Exam  Constitutional: She appears well-developed and well-nourished. No distress.  HENT:  Head: Normocephalic.  Right Ear: External ear normal.  Left Ear: External ear normal.  Nose: Nose normal.  Mouth/Throat: Oropharynx is clear and moist.  Eyes: Conjunctivae are normal. Pupils are equal, round, and reactive to light. Right eye exhibits no discharge. Left eye  exhibits no discharge.  Neck: Normal range of motion. Neck supple. No JVD present. No tracheal deviation present. No thyromegaly present.  Cardiovascular: Normal rate, regular rhythm and normal heart sounds.   Pulmonary/Chest: No stridor. No respiratory distress. She has no wheezes.  Abdominal: Soft. Bowel sounds are normal. She exhibits no distension and no mass. There is no tenderness. There is no rebound and no guarding.  Musculoskeletal: She exhibits tenderness. She exhibits no edema.  Lymphadenopathy:    She has no cervical adenopathy.  Neurological: She displays normal reflexes. No cranial nerve deficit. She exhibits normal muscle tone. Coordination normal.  Skin: No rash noted. No erythema.  Psychiatric: She has a normal mood and affect. Her behavior is normal. Judgment and thought content normal.       sad   Lab Results  Component Value Date   WBC 4.4 08/19/2011   HGB 13.4 08/19/2011   HCT 39.2 08/19/2011   PLT 159 08/19/2011   GLUCOSE 121* 08/19/2011   ALT 13 08/19/2011   AST 17 08/19/2011   NA 142 08/19/2011   K 3.9 08/19/2011   CL 105 08/19/2011   CREATININE 1.19* 08/19/2011   BUN 19 08/19/2011   CO2 28 08/19/2011   TSH 2.88 01/09/2011   INR 0.90* 02/17/2011           Assessment & Plan:

## 2011-08-26 NOTE — Assessment & Plan Note (Signed)
Xarelto is more affordable

## 2011-08-26 NOTE — Assessment & Plan Note (Signed)
Continue with current prescription therapy as reflected on the Med list.  

## 2011-08-31 ENCOUNTER — Telehealth: Payer: Self-pay

## 2011-08-31 NOTE — Telephone Encounter (Signed)
Ok Zpac Thx 

## 2011-08-31 NOTE — Telephone Encounter (Signed)
Pt advised of Rx/pharmacy 

## 2011-08-31 NOTE — Telephone Encounter (Signed)
Pt called c/o of fever (100-101), productive cough (green), ST and body aches. Pt is requesting Rx for zpak, please advise.

## 2011-09-01 ENCOUNTER — Telehealth: Payer: Self-pay | Admitting: *Deleted

## 2011-09-01 MED ORDER — AZITHROMYCIN 250 MG PO TABS: ORAL_TABLET | ORAL | Status: DC

## 2011-09-01 NOTE — Telephone Encounter (Signed)
Pt l.eft msg on vm [facility] states they haven't received rx that was suppose to be sent in yesterday. Called pt back z-pack was sent electronically. Will call med into pharmacy... 09/01/11@9 :53am/LMB

## 2011-09-08 ENCOUNTER — Telehealth: Payer: Self-pay

## 2011-09-08 NOTE — Telephone Encounter (Signed)
Patient called LM on triage c/o cough with dark blood in it. She she completed abx and has continued cough. Patient messg transferred to scheduler to set up appt.

## 2011-09-08 NOTE — Telephone Encounter (Signed)
APPT AT 8:00 MAY 8.

## 2011-09-09 ENCOUNTER — Ambulatory Visit (INDEPENDENT_AMBULATORY_CARE_PROVIDER_SITE_OTHER): Payer: Medicare Other | Admitting: Internal Medicine

## 2011-09-09 ENCOUNTER — Encounter: Payer: Self-pay | Admitting: Internal Medicine

## 2011-09-09 VITALS — BP 110/72 | HR 86 | Temp 97.4°F | Ht 61.5 in | Wt 155.4 lb

## 2011-09-09 DIAGNOSIS — B370 Candidal stomatitis: Secondary | ICD-10-CM

## 2011-09-09 DIAGNOSIS — J441 Chronic obstructive pulmonary disease with (acute) exacerbation: Secondary | ICD-10-CM | POA: Diagnosis not present

## 2011-09-09 DIAGNOSIS — R110 Nausea: Secondary | ICD-10-CM

## 2011-09-09 DIAGNOSIS — J189 Pneumonia, unspecified organism: Secondary | ICD-10-CM

## 2011-09-09 MED ORDER — PROMETHAZINE HCL 25 MG PO TABS: 25.0000 mg | ORAL_TABLET | Freq: Four times a day (QID) | ORAL | Status: DC | PRN

## 2011-09-09 MED ORDER — METHYLPREDNISOLONE ACETATE 80 MG/ML IJ SUSP
120.0000 mg | Freq: Once | INTRAMUSCULAR | Status: AC
  Administered 2011-09-09: 120 mg via INTRAMUSCULAR

## 2011-09-09 MED ORDER — LEVOFLOXACIN 250 MG PO TABS: 250.0000 mg | ORAL_TABLET | Freq: Every day | ORAL | Status: AC

## 2011-09-09 MED ORDER — HYDROCODONE-HOMATROPINE 5-1.5 MG/5ML PO SYRP: 5.0000 mL | ORAL_SOLUTION | Freq: Four times a day (QID) | ORAL | Status: AC | PRN

## 2011-09-09 MED ORDER — METHYLPREDNISOLONE 4 MG PO KIT: PACK | ORAL | Status: DC

## 2011-09-09 MED ORDER — PROMETHAZINE HCL 25 MG/ML IJ SOLN
25.0000 mg | Freq: Once | INTRAMUSCULAR | Status: AC
  Administered 2011-09-09: 25 mg via INTRAMUSCULAR

## 2011-09-09 MED ORDER — CEFTRIAXONE SODIUM 1 G IJ SOLR
1.0000 g | Freq: Once | INTRAMUSCULAR | Status: AC
  Administered 2011-09-09: 1 g via INTRAMUSCULAR

## 2011-09-09 NOTE — Assessment & Plan Note (Signed)
liekly related to acute illness - for phenergan 25mg  IM, and po for home

## 2011-09-09 NOTE — Patient Instructions (Addendum)
You had 3 shots today - rocephin antibiotic, depomedrol (steroid), and phenergan for nausea Take all new medications as prescribed - the antibiotic (levaquin), medrol (a steroid but not prednisone), cough medicine (which has hydrocodone, not codeine so should be ok), and phenergan pills for home (all sent to your pharmacy except for the cough medicine) Continue all other medications as before, including the diflucan Please go to XRAY in the Basement for the x-ray test You will be contacted by phone if any changes need to be made immediately.  Otherwise, you will receive a letter about your results with an explanation.

## 2011-09-09 NOTE — Assessment & Plan Note (Signed)
Clinical dx - just finished zpack, but with LLL rales, worsening relative hypoxia x 2 days (97 TO 93 today), prod cough - has numerous allergies but apparently ok with cephalosporins  - for rocephin IM, and levaquin course, check cxr

## 2011-09-09 NOTE — Progress Notes (Signed)
Subjective:    Patient ID: Diane Abbott, female    DOB: June 14, 1934, 77 y.o.   MRN: [id number]  HPI Here as acute with 2-3 days onset ST, HA, general weakness and malaise, with prod cough greenish sputum and worsening sob/doe with wheezing and o2 sat at home decreased from 97 to 93% over past 3 days with low grade temp, but Pt denies chest pain, orthopnea, PND, increased LE swelling, palpitations, dizziness or syncope.  Has mult allergies to mult meds.  Last seen per heme with normal WBC apr 17, did have slight elev glc 121.  Pt denies polydipsia, polyuria.  Has taken zpack x 5 days (finished x 3 days). Pt denies new neurological symptoms such as new headache, or facial or extremity weakness or numbness. Has marked nausea oveall but denies vomiting or abd pain and Denies worsening reflux, dysphagia,  bowel change or blood.  Past Medical History  Diagnosis Date  . Fibromyalgia   . Pulmonary embolism   . RA (rheumatoid arthritis)   . Vitamin B 12 deficiency   . Vitamin d deficiency   . Osteoarthritis   . Osteopenia   . Adrenal insufficiency   . IBS (irritable bowel syndrome)   . History of blood clots 2008    below knee  . Diabetes mellitus type II     pt denies this 03-05-11  . Esophagitis   . Depression   . GERD (gastroesophageal reflux disease)   . Connective tissue disorder    Past Surgical History  Procedure Date  . Abdominal hysterectomy   . Total knee arthroplasty     bilateral  . Cholecystectomy 2003  . Tonsillectomy 1941  . Appendectomy 1995  . Wrist surgery     bilateral  . Bilateral oophorectomy   . Tummy tuck   . Cosmetic surgery     reports that she has never smoked. She has never used smokeless tobacco. She reports that she drinks alcohol. She reports that she does not use illicit drugs. family history includes Breast cancer in an unspecified family member; COPD in her father; Clotting disorder in an unspecified family member; Diabetes in her mother; Heart disease  in her maternal grandmother; Ovarian cancer in her mother; and Pancreatic cancer in an unspecified family member.  There is no history of Colon cancer. Allergies  Allergen Reactions  . Alprazolam   . Benzodiazepines   . Clonazepam   . Codeine   . Darvocet (Propoxyphene-Acetaminophen)   . Diclofenac Sodium   . Duloxetine     REACTION: achy legs  . Gabapentin   . Morphine And Related   . Nortriptyline Hcl     REACTION: HA  . Penicillins     REACTION: Can take Cephalosporins  . Pramipexole Dihydrochloride     REACTION: sick, falling  . Prednisone   . Rofecoxib   . Sulfonamide Derivatives   . Venlafaxine    Current Outpatient Prescriptions on File Prior to Visit  Medication Sig Dispense Refill  . Cholecalciferol (VITAMIN D) 1000 UNITS capsule Take 1,000 Units by mouth daily.        . clotrimazole-betamethasone (LOTRISONE) cream Apply topically 2 (two) times daily.  90 g  3  . cyanocobalamin (,VITAMIN B-12,) 1000 MCG/ML injection Inject 1,000 mcg into the muscle once a week.        . esomeprazole (NEXIUM) 40 MG capsule Take 40 mg by mouth 2 (two) times daily.        Marland Kitchen estradiol (VAGIFEM) 25 MCG vaginal tablet  Place 25 mcg vaginally as directed. every 2 days       . etanercept (ENBREL) 50 MG/ML injection Inject 50 mg into the skin once a week.        . ferrous sulfate 325 (65 FE) MG tablet Take 325 mg by mouth daily with breakfast.        . fluconazole (DIFLUCAN) 200 MG tablet Take 1 tablet (200 mg total) by mouth daily.  21 tablet  1  . hyoscyamine (LEVSIN SL) 0.125 MG SL tablet Place 1-2 tablets (0.125-0.25 mg total) under the tongue 4 (four) times daily as needed.  60 tablet  3  . ipratropium (ATROVENT) 0.06 % nasal spray Place 1-2 sprays into the nose as needed. For runny nose  15 mL  3  . Ipratropium-Albuterol (COMBIVENT RESPIMAT) 20-100 MCG/ACT AERS Inhale 2 Act into the lungs 4 (four) times daily - after meals and at bedtime.  1 Inhaler  3  . ketoconazole (NIZORAL) 2 % cream Use  bid x 1 month  75 g  2  . naproxen sodium (ANAPROX) 220 MG tablet Take 220 mg by mouth as needed. For pain       . oxyCODONE (OXYCONTIN) 10 MG 12 hr tablet Take 1 tablet (10 mg total) by mouth every 12 (twelve) hours as needed for pain. Please fill on 10/10/11  60 tablet  0  . polyethylene glycol (MIRALAX / GLYCOLAX) packet Take 17 g by mouth daily.        . Probiotic Product (ALIGN PO) Take 1 capsule by mouth daily.        . Rivaroxaban (XARELTO) 20 MG TABS Take 20 mg by mouth 1 day or 1 dose.  90 tablet  3  . rOPINIRole (REQUIP) 2 MG tablet 1 po tid  90 tablet  11  . sucralfate (CARAFATE) 1 GM/10ML suspension Take 10 mLs (1 g total) by mouth 2 (two) times daily.  300 mL  1  . triazolam (HALCION) 0.25 MG tablet Take 1 tablet (0.25 mg total) by mouth at bedtime as needed.  30 tablet  5  . meclizine (ANTIVERT) 12.5 MG tablet Take 1 tablet (12.5 mg total) by mouth 3 (three) times daily as needed for dizziness or nausea.  60 tablet  3  . promethazine (PHENERGAN) 25 MG tablet Take 1 tablet (25 mg total) by mouth every 6 (six) hours as needed for nausea.  40 tablet  0   No current facility-administered medications on file prior to visit.   Review of Systems Review of Systems  Constitutional: Negative for diaphoresis and unexpected weight change.  HENT: Negative for drooling and tinnitus.   Eyes: Negative for photophobia and visual disturbance.  Respiratory: Negative for choking and stridor.   Gastrointestinal: Negative for vomiting and blood in stool.  Genitourinary: Negative for hematuria and decreased urine volume.  Neurological: Negative for tremors and numbness.  Psychiatric/Behavioral: Negative for decreased concentration. The patient is not hyperactive.       Objective:   Physical Exam BP 110/72  Pulse 86  Temp(Src) 97.4 F (36.3 C) (Oral)  Ht 5' 1.5" (1.562 m)  Wt 155 lb 6 oz (70.478 kg)  BMI 28.88 kg/m2  SpO2 93% Physical Exam  VS noted, mild to mod ill, fatigued, mild  dyspniec Constitutional: Pt appears well-developed and well-nourished.  HENT: Head: Normocephalic.  Right Ear: External ear normal.  Left Ear: External ear normal.  Bilat tm's mild erythema.  Sinus nontender.  Pharynx mild erythema Eyes: Conjunctivae and EOM  are normal. Pupils are equal, round, and reactive to light.  Neck: Normal range of motion. Neck supple.  Cardiovascular: Normal rate and regular rhythm.   Pulmonary/Chest: Effort normal and breath sounds decreased with bilat mild wheezing, no retractions, few LLL rales noted  Abd:  Soft, NT, non-distended, + BS Neurological: Pt is alert. Not confused Skin: Skin is warm. No erythema.  Psychiatric: Pt behavior is normal. Thought content normal.     Assessment & Plan:

## 2011-09-09 NOTE — Assessment & Plan Note (Signed)
Mild, to cont the diflucan as prescribed

## 2011-09-09 NOTE — Assessment & Plan Note (Signed)
Mild to mod, for depomedrol, and medrol pack for home, to f/u any worsening symptoms or concerns

## 2011-09-10 ENCOUNTER — Encounter: Payer: Self-pay | Admitting: Internal Medicine

## 2011-09-10 ENCOUNTER — Ambulatory Visit (INDEPENDENT_AMBULATORY_CARE_PROVIDER_SITE_OTHER)
Admission: RE | Admit: 2011-09-10 | Discharge: 2011-09-10 | Disposition: A | Discharge status: Home / Self Care | Payer: Medicare Other | Source: Ambulatory Visit | Attending: Internal Medicine | Admitting: Internal Medicine

## 2011-09-10 DIAGNOSIS — J438 Other emphysema: Secondary | ICD-10-CM | POA: Diagnosis not present

## 2011-09-10 DIAGNOSIS — R062 Wheezing: Secondary | ICD-10-CM | POA: Diagnosis not present

## 2011-09-10 DIAGNOSIS — J189 Pneumonia, unspecified organism: Secondary | ICD-10-CM | POA: Diagnosis not present

## 2011-09-10 DIAGNOSIS — R0602 Shortness of breath: Secondary | ICD-10-CM | POA: Diagnosis not present

## 2011-09-17 ENCOUNTER — Ambulatory Visit (INDEPENDENT_AMBULATORY_CARE_PROVIDER_SITE_OTHER): Payer: Medicare Other | Admitting: Internal Medicine

## 2011-09-17 ENCOUNTER — Encounter: Payer: Self-pay | Admitting: Internal Medicine

## 2011-09-17 VITALS — BP 128/60 | HR 64 | Temp 97.5°F | Resp 16 | Wt 152.0 lb

## 2011-09-17 DIAGNOSIS — J40 Bronchitis, not specified as acute or chronic: Secondary | ICD-10-CM

## 2011-09-17 DIAGNOSIS — B370 Candidal stomatitis: Secondary | ICD-10-CM

## 2011-09-17 DIAGNOSIS — E538 Deficiency of other specified B group vitamins: Secondary | ICD-10-CM

## 2011-09-17 DIAGNOSIS — J4489 Other specified chronic obstructive pulmonary disease: Secondary | ICD-10-CM

## 2011-09-17 DIAGNOSIS — J449 Chronic obstructive pulmonary disease, unspecified: Secondary | ICD-10-CM | POA: Diagnosis not present

## 2011-09-17 MED ORDER — FLUCONAZOLE 200 MG PO TABS: 200.0000 mg | ORAL_TABLET | Freq: Every day | ORAL | Status: DC

## 2011-09-17 NOTE — Assessment & Plan Note (Signed)
Treated

## 2011-09-17 NOTE — Assessment & Plan Note (Signed)
Recurrent post - abx Usually it takes her 8 wks of Diflucan to get rid of it [employer]

## 2011-09-17 NOTE — Progress Notes (Signed)
Patient ID: Diane Abbott, female   DOB: May 26, 1934, 77 y.o.   MRN: [id number] Patient ID: Diane Abbott, female   DOB: [birth year], 77 y.o.   MRN: [id number] Patient ID: Diane Abbott, female   DOB: 1934-07-17, 77 y.o.   MRN: [id number]  Subjective:    Patient ID: Diane Abbott, female    DOB: Mar 29, 1935, 77 y.o.   MRN: [id number]  Cough Associated symptoms include myalgias, shortness of breath and wheezing. Pertinent negatives include no chills, headaches or rash.   C/o recent pneumonia, thrush. C/o weakness and fatigue.   The patient is here to follow up on chronic depression, anxiety, headaches and chronic moderate fibromyalgia symptoms controlled with medicines, diet and exercise. She has been using Lovenox irregular due to cost... Xarelto is less $$: $450 vs $4500/mo  Wt Readings from Last 3 Encounters:  09/17/11 152 lb (68.947 kg)  09/09/11 155 lb 6 oz (70.478 kg)  08/26/11 162 lb (73.483 kg)   BP Readings from Last 3 Encounters:  09/17/11 128/60  09/09/11 110/72  08/26/11 136/84     Review of Systems  Constitutional: Negative for chills, activity change, appetite change, fatigue and unexpected weight change.  HENT: Positive for sinus pressure. Negative for congestion and mouth sores.   Eyes: Negative for visual disturbance.  Respiratory: Positive for cough, shortness of breath and wheezing. Negative for chest tightness.   Gastrointestinal: Negative for nausea and abdominal pain.  Genitourinary: Negative for frequency, difficulty urinating and vaginal pain.  Musculoskeletal: Positive for myalgias, back pain and arthralgias. Negative for gait problem.  Skin: Negative for pallor and rash.  Neurological: Negative for dizziness, tremors, weakness, numbness and headaches.  Psychiatric/Behavioral: Negative for confusion and sleep disturbance.       Objective:   Physical Exam  Constitutional: She appears well-developed and well-nourished. No distress.  HENT:  Head:  Normocephalic.  Right Ear: External ear normal.  Left Ear: External ear normal.  Nose: Nose normal.  Mouth/Throat: Oropharynx is clear and moist.  Eyes: Conjunctivae are normal. Pupils are equal, round, and reactive to light. Right eye exhibits no discharge. Left eye exhibits no discharge.  Neck: Normal range of motion. Neck supple. No JVD present. No tracheal deviation present. No thyromegaly present.  Cardiovascular: Normal rate, regular rhythm and normal heart sounds.   Pulmonary/Chest: No stridor. No respiratory distress. She has no wheezes.  Abdominal: Soft. Bowel sounds are normal. She exhibits no distension and no mass. There is no tenderness. There is no rebound and no guarding.  Musculoskeletal: She exhibits tenderness. She exhibits no edema.  Lymphadenopathy:    She has no cervical adenopathy.  Neurological: She displays normal reflexes. No cranial nerve deficit. She exhibits normal muscle tone. Coordination normal.  Skin: No rash noted. No erythema.  Psychiatric: She has a normal mood and affect. Her behavior is normal. Judgment and thought content normal.       sad   Lab Results  Component Value Date   WBC 4.4 08/19/2011   HGB 13.4 08/19/2011   HCT 39.2 08/19/2011   PLT 159 08/19/2011   GLUCOSE 121* 08/19/2011   ALT 13 08/19/2011   AST 17 08/19/2011   NA 142 08/19/2011   K 3.9 08/19/2011   CL 105 08/19/2011   CREATININE 1.19* 08/19/2011   BUN 19 08/19/2011   CO2 28 08/19/2011   TSH 2.88 01/09/2011   INR 0.90* 02/17/2011           Assessment & Plan:

## 2011-09-17 NOTE — Patient Instructions (Signed)
Align 1 a day x 1 month 

## 2011-09-17 NOTE — Assessment & Plan Note (Signed)
Worse 10/12, 5/13 treated

## 2011-09-17 NOTE — Assessment & Plan Note (Signed)
Continue with current prescription therapy as reflected on the Med list.  

## 2011-10-01 ENCOUNTER — Other Ambulatory Visit: Payer: Medicare Other

## 2011-10-28 ENCOUNTER — Other Ambulatory Visit: Payer: Self-pay | Admitting: Orthopedic Surgery

## 2011-10-28 DIAGNOSIS — M25519 Pain in unspecified shoulder: Secondary | ICD-10-CM | POA: Diagnosis not present

## 2011-10-28 DIAGNOSIS — M25512 Pain in left shoulder: Secondary | ICD-10-CM

## 2011-10-28 DIAGNOSIS — M1990 Unspecified osteoarthritis, unspecified site: Secondary | ICD-10-CM

## 2011-10-30 ENCOUNTER — Ambulatory Visit
Admission: RE | Admit: 2011-10-30 | Discharge: 2011-10-30 | Disposition: A | Discharge status: Home / Self Care | Payer: Medicare Other | Source: Ambulatory Visit | Attending: Orthopedic Surgery | Admitting: Orthopedic Surgery

## 2011-10-30 DIAGNOSIS — M25519 Pain in unspecified shoulder: Secondary | ICD-10-CM | POA: Diagnosis not present

## 2011-10-30 DIAGNOSIS — M1990 Unspecified osteoarthritis, unspecified site: Secondary | ICD-10-CM

## 2011-10-30 DIAGNOSIS — M25829 Other specified joint disorders, unspecified elbow: Secondary | ICD-10-CM | POA: Diagnosis not present

## 2011-10-30 DIAGNOSIS — M2580 Other specified joint disorders, unspecified joint: Secondary | ICD-10-CM | POA: Diagnosis not present

## 2011-10-30 DIAGNOSIS — M25512 Pain in left shoulder: Secondary | ICD-10-CM

## 2011-10-30 DIAGNOSIS — M19019 Primary osteoarthritis, unspecified shoulder: Secondary | ICD-10-CM | POA: Diagnosis not present

## 2011-11-04 ENCOUNTER — Ambulatory Visit: Payer: Medicare Other | Admitting: Internal Medicine

## 2011-11-11 ENCOUNTER — Telehealth: Payer: Self-pay | Admitting: Internal Medicine

## 2011-11-11 NOTE — Telephone Encounter (Signed)
Form is at my desk. Will have it for him to complete at next OV.

## 2011-11-11 NOTE — Telephone Encounter (Signed)
Pt has an appt on Tues. July 16.  Do we have the form from the surgical doctor, or do we need to have them fax a new one?

## 2011-11-11 NOTE — Telephone Encounter (Signed)
Not sure. I should see her anyway for an OV to clear Thx

## 2011-11-11 NOTE — Telephone Encounter (Signed)
Caller: Ashtynn/Mother; Phone Number: [phone]; Message from caller:   Pt is supposed to have shoulder surgery and needs clearance from Dr Posey Rea.  Pt stating that surgoen's office sent form to be filled out 10/27/2011 , and they haven't received it back.  Pt wants to know if the form has been seen by Dr Posey Rea yet, or if he is waiting til her appt 11/25/2011 .  Pt can be called back at [phone]

## 2011-11-17 ENCOUNTER — Ambulatory Visit (INDEPENDENT_AMBULATORY_CARE_PROVIDER_SITE_OTHER): Payer: Medicare Other | Admitting: Internal Medicine

## 2011-11-17 ENCOUNTER — Encounter: Payer: Self-pay | Admitting: Internal Medicine

## 2011-11-17 VITALS — BP 112/68 | HR 84 | Temp 97.5°F | Resp 16 | Wt 161.0 lb

## 2011-11-17 DIAGNOSIS — K589 Irritable bowel syndrome without diarrhea: Secondary | ICD-10-CM

## 2011-11-17 DIAGNOSIS — F329 Major depressive disorder, single episode, unspecified: Secondary | ICD-10-CM

## 2011-11-17 DIAGNOSIS — Z01818 Encounter for other preprocedural examination: Secondary | ICD-10-CM | POA: Diagnosis not present

## 2011-11-17 DIAGNOSIS — E559 Vitamin D deficiency, unspecified: Secondary | ICD-10-CM

## 2011-11-17 DIAGNOSIS — IMO0001 Reserved for inherently not codable concepts without codable children: Secondary | ICD-10-CM | POA: Diagnosis not present

## 2011-11-17 DIAGNOSIS — K209 Esophagitis, unspecified without bleeding: Secondary | ICD-10-CM | POA: Diagnosis not present

## 2011-11-17 DIAGNOSIS — E538 Deficiency of other specified B group vitamins: Secondary | ICD-10-CM

## 2011-11-17 DIAGNOSIS — J449 Chronic obstructive pulmonary disease, unspecified: Secondary | ICD-10-CM | POA: Diagnosis not present

## 2011-11-17 DIAGNOSIS — K219 Gastro-esophageal reflux disease without esophagitis: Secondary | ICD-10-CM

## 2011-11-17 DIAGNOSIS — M069 Rheumatoid arthritis, unspecified: Secondary | ICD-10-CM

## 2011-11-17 DIAGNOSIS — Z7189 Other specified counseling: Secondary | ICD-10-CM | POA: Insufficient documentation

## 2011-11-17 MED ORDER — OXYCODONE HCL 10 MG PO TB12: 10.0000 mg | ORAL_TABLET | Freq: Two times a day (BID) | ORAL | Status: DC | PRN

## 2011-11-17 NOTE — Assessment & Plan Note (Signed)
Albuterol HHN perioperatively

## 2011-11-17 NOTE — Assessment & Plan Note (Signed)
She is clear for surgery assuming that her pre-op test/labs that you asked her to have come back with acceptable results. Please, hold and resume anticoagulation per your protocol. She is at a higher risk for thrombotic events. She is aware. She has been tolerating her long-term Xarelto well...  Thank you!

## 2011-11-17 NOTE — Assessment & Plan Note (Signed)
Continue with current prescription therapy as reflected on the Med list.  

## 2011-11-17 NOTE — Assessment & Plan Note (Signed)
Chronic FMS/CTD, severe  Potential benefits of a long term opioids use as well as potential risks (i.e. addiction risk, apnea etc) and complications (i.e. Somnolence, constipation and others) were explained to the patient and were aknowledged. Continue with current prescription therapy as reflected on the Med list.

## 2011-11-17 NOTE — Progress Notes (Signed)
Subjective:    Patient ID: Diane Abbott, female    DOB: 03/19/1935, 77 y.o.   MRN: [id number]  HPI  IM consult Req by Dr Dion Saucier Reason: pre-op L shoulder -- shoulder joint replacement  The patient is here to follow up on chronic depression, anxiety, headaches and chronic moderate fibromyalgia symptoms controlled with medicines, stable. She has been using Lovenox irregular due to cost... Xarelto is less $$ - we switched her to Xarelto a few months ago.  Wt Readings from Last 3 Encounters:  11/17/11 161 lb (73.029 kg)  09/17/11 152 lb (68.947 kg)  09/09/11 155 lb 6 oz (70.478 kg)   BP Readings from Last 3 Encounters:  11/17/11 112/68  09/17/11 128/60  09/09/11 110/72   Past Medical History  Diagnosis Date  . Fibromyalgia   . Pulmonary embolism   . RA (rheumatoid arthritis)   . Vitamin B 12 deficiency   . Vitamin d deficiency   . Osteoarthritis   . Osteopenia   . Adrenal insufficiency   . IBS (irritable bowel syndrome)   . History of blood clots 2008    below knee  . Diabetes mellitus type II     pt denies this 03-05-11  . Esophagitis   . Depression   . GERD (gastroesophageal reflux disease)   . Connective tissue disorder    Past Surgical History  Procedure Date  . Abdominal hysterectomy   . Total knee arthroplasty     bilateral  . Cholecystectomy 2003  . Tonsillectomy 1941  . Appendectomy 1995  . Wrist surgery     bilateral  . Bilateral oophorectomy   . Tummy tuck   . Cosmetic surgery     reports that she has never smoked. She has never used smokeless tobacco. She reports that she drinks alcohol. She reports that she does not use illicit drugs. family history includes Breast cancer in an unspecified family member; COPD in her father; Clotting disorder in an unspecified family member; Diabetes in her mother; Heart disease in her maternal grandmother; Ovarian cancer in her mother; and Pancreatic cancer in an unspecified family member.  There is no history of  Colon cancer. Allergies  Allergen Reactions  . Alprazolam   . Benzodiazepines   . Clonazepam   . Codeine   . Darvocet (Propoxyphene-Acetaminophen)   . Diclofenac Sodium   . Duloxetine     REACTION: achy legs  . Gabapentin   . Morphine And Related   . Nortriptyline Hcl     REACTION: HA  . Penicillins     REACTION: Can take Cephalosporins  . Pramipexole Dihydrochloride     REACTION: sick, falling  . Prednisone   . Rofecoxib   . Sulfonamide Derivatives   . Venlafaxine    Current Outpatient Prescriptions on File Prior to Visit  Medication Sig Dispense Refill  . Cholecalciferol (VITAMIN D) 1000 UNITS capsule Take 1,000 Units by mouth daily.        . clotrimazole-betamethasone (LOTRISONE) cream Apply topically 2 (two) times daily.  90 g  3  . cyanocobalamin (,VITAMIN B-12,) 1000 MCG/ML injection Inject 1,000 mcg into the muscle once a week.        . esomeprazole (NEXIUM) 40 MG capsule Take 40 mg by mouth 2 (two) times daily.        Marland Kitchen estradiol (VAGIFEM) 25 MCG vaginal tablet Place 25 mcg vaginally as directed. every 2 days       . fluconazole (DIFLUCAN) 200 MG tablet Take 1 tablet (  200 mg total) by mouth daily.  30 tablet  1  . hyoscyamine (LEVSIN SL) 0.125 MG SL tablet Place 1-2 tablets (0.125-0.25 mg total) under the tongue 4 (four) times daily as needed.  60 tablet  3  . ipratropium (ATROVENT) 0.06 % nasal spray Place 1-2 sprays into the nose as needed. For runny nose  15 mL  3  . Ipratropium-Albuterol (COMBIVENT RESPIMAT) 20-100 MCG/ACT AERS Inhale 2 Act into the lungs 4 (four) times daily - after meals and at bedtime.  1 Inhaler  3  . naproxen sodium (ANAPROX) 220 MG tablet Take 220 mg by mouth as needed. For pain       . Probiotic Product (ALIGN PO) Take 1 capsule by mouth daily.        . promethazine (PHENERGAN) 25 MG tablet Take 25 mg by mouth every 6 (six) hours as needed.      . Rivaroxaban (XARELTO) 20 MG TABS Take 20 mg by mouth 1 day or 1 dose.  90 tablet  3  .  rOPINIRole (REQUIP) 2 MG tablet 1 po tid  90 tablet  11  . DISCONTD: oxyCODONE (OXYCONTIN) 10 MG 12 hr tablet Take 1 tablet (10 mg total) by mouth every 12 (twelve) hours as needed for pain. Please fill on 10/10/11  60 tablet  0  . etanercept (ENBREL) 50 MG/ML injection Inject 50 mg into the skin once a week.        . ferrous sulfate 325 (65 FE) MG tablet Take 325 mg by mouth daily with breakfast.        . ketoconazole (NIZORAL) 2 % cream Use bid x 1 month  75 g  2  . polyethylene glycol (MIRALAX / GLYCOLAX) packet Take 17 g by mouth daily.        . sucralfate (CARAFATE) 1 GM/10ML suspension Take 10 mLs (1 g total) by mouth 2 (two) times daily.  300 mL  1  . triazolam (HALCION) 0.25 MG tablet Take 1 tablet (0.25 mg total) by mouth at bedtime as needed.  30 tablet  5      Review of Systems  Constitutional: Negative for activity change, appetite change, fatigue and unexpected weight change.  HENT: Positive for sinus pressure. Negative for congestion and mouth sores.   Eyes: Negative for visual disturbance.  Respiratory: Negative for chest tightness.   Gastrointestinal: Negative for nausea and abdominal pain.  Genitourinary: Negative for frequency, difficulty urinating and vaginal pain.  Musculoskeletal: Positive for back pain and arthralgias. Negative for gait problem.  Skin: Negative for pallor.  Neurological: Negative for dizziness, tremors, weakness and numbness.  Psychiatric/Behavioral: Negative for confusion and disturbed wake/sleep cycle.   BP 112/68  Pulse 84  Temp 97.5 F (36.4 C) (Oral)  Resp 16  Wt 161 lb (73.029 kg)     Objective:   Physical Exam  Constitutional: She appears well-developed and well-nourished. No distress.  HENT:  Head: Normocephalic.  Right Ear: External ear normal.  Left Ear: External ear normal.  Nose: Nose normal.  Mouth/Throat: Oropharynx is clear and moist.  Eyes: Conjunctivae are normal. Pupils are equal, round, and reactive to light. Right eye  exhibits no discharge. Left eye exhibits no discharge.  Neck: Normal range of motion. Neck supple. No JVD present. No tracheal deviation present. No thyromegaly present.  Cardiovascular: Normal rate, regular rhythm and normal heart sounds.   Pulmonary/Chest: No stridor. No respiratory distress. She has no wheezes.  Abdominal: Soft. Bowel sounds are normal. She exhibits  no distension and no mass. There is no tenderness. There is no rebound and no guarding.  Musculoskeletal: She exhibits tenderness. She exhibits no edema.       L shoulder is tender w/ROM  Lymphadenopathy:    She has no cervical adenopathy.  Neurological: She displays normal reflexes. No cranial nerve deficit. She exhibits normal muscle tone. Coordination normal.  Skin: No rash noted. No erythema.  Psychiatric: She has a normal mood and affect. Her behavior is normal. Judgment and thought content normal.       sad   Lab Results  Component Value Date   WBC 4.4 08/19/2011   HGB 13.4 08/19/2011   HCT 39.2 08/19/2011   PLT 159 08/19/2011   GLUCOSE 121* 08/19/2011   ALT 13 08/19/2011   AST 17 08/19/2011   NA 142 08/19/2011   K 3.9 08/19/2011   CL 105 08/19/2011   CREATININE 1.19* 08/19/2011   BUN 19 08/19/2011   CO2 28 08/19/2011   TSH 2.88 01/09/2011   INR 0.90* 02/17/2011           Assessment & Plan:

## 2011-11-17 NOTE — Assessment & Plan Note (Signed)
Cont PPIs

## 2011-11-23 ENCOUNTER — Encounter (HOSPITAL_COMMUNITY): Payer: Self-pay | Admitting: Respiratory Therapy

## 2011-11-25 DIAGNOSIS — R5383 Other fatigue: Secondary | ICD-10-CM | POA: Diagnosis not present

## 2011-11-25 DIAGNOSIS — Z79899 Other long term (current) drug therapy: Secondary | ICD-10-CM | POA: Diagnosis not present

## 2011-11-25 DIAGNOSIS — M069 Rheumatoid arthritis, unspecified: Secondary | ICD-10-CM | POA: Diagnosis not present

## 2011-11-25 DIAGNOSIS — R7612 Nonspecific reaction to cell mediated immunity measurement of gamma interferon antigen response without active tuberculosis: Secondary | ICD-10-CM | POA: Diagnosis not present

## 2011-11-25 DIAGNOSIS — R5381 Other malaise: Secondary | ICD-10-CM | POA: Diagnosis not present

## 2011-11-25 DIAGNOSIS — M25519 Pain in unspecified shoulder: Secondary | ICD-10-CM | POA: Diagnosis not present

## 2011-11-25 DIAGNOSIS — M25549 Pain in joints of unspecified hand: Secondary | ICD-10-CM | POA: Diagnosis not present

## 2011-11-26 ENCOUNTER — Telehealth: Payer: Self-pay

## 2011-11-26 NOTE — Telephone Encounter (Signed)
Pt is currently taking Xarelto because she cannot afford Lovenox. Care manager can assist pt with applying for financial assistance with Lovenox but is requesting MD advisement forst.

## 2011-11-26 NOTE — Telephone Encounter (Signed)
I thought Xarelto was less expinsive for her - I'm OK with Xarelto Thx

## 2011-11-27 NOTE — Telephone Encounter (Signed)
Left message on VM advising Care Manager to proceed with financial assistance with pt.

## 2011-11-30 NOTE — Pre-Procedure Instructions (Signed)
22 Diane Abbott   11/30/2011   Your procedure is scheduled on:  Monday, August 5th   Report to [facility] at 10:00 AM. ** Please Note, Short Stay is now on the 3rd Floor (3700)   Call this number if you have problems the morning of surgery: [phone]   Remember:   Do not eat food or drink ANY liquids:After Midnight Sunday .  Take these medicines the morning of surgery with A SIP OF WATER: Nexium, Inhaler   Do not wear jewelry, make-up or nail polish.  Do not wear lotions, powders, or perfumes. You may NOT wear deodorant.   Do not shave 48 hours prior to surgery. Men may shave face and neck.  Do not bring valuables to the hospital.   Contacts, dentures or bridgework may not be worn into surgery.  Leave suitcase in the car. After surgery it may be brought to your room.   For patients admitted to the hospital, checkout time is 11:00 AM the day of discharge.   Patients discharged the day of surgery will not be allowed to drive home.  Name and phone number of your driver:   Special Instructions: CHG Shower Use Special Wash: 1/2 bottle night before surgery and 1/2 bottle morning of surgery.   Please read over the following fact sheets that you were given: Pain Booklet, Coughing and Deep Breathing, MRSA Information and Surgical Site Infection Prevention

## 2011-12-01 ENCOUNTER — Encounter (HOSPITAL_COMMUNITY)
Admission: RE | Admit: 2011-12-01 | Discharge: 2011-12-01 | Disposition: A | Discharge status: Home / Self Care | Payer: Medicare Other | Source: Ambulatory Visit | Attending: Orthopedic Surgery | Admitting: Orthopedic Surgery

## 2011-12-01 ENCOUNTER — Other Ambulatory Visit: Payer: Self-pay | Admitting: Orthopedic Surgery

## 2011-12-01 ENCOUNTER — Encounter (HOSPITAL_COMMUNITY): Payer: Self-pay

## 2011-12-01 DIAGNOSIS — E119 Type 2 diabetes mellitus without complications: Secondary | ICD-10-CM | POA: Diagnosis not present

## 2011-12-01 DIAGNOSIS — D62 Acute posthemorrhagic anemia: Secondary | ICD-10-CM | POA: Diagnosis not present

## 2011-12-01 DIAGNOSIS — M19019 Primary osteoarthritis, unspecified shoulder: Secondary | ICD-10-CM | POA: Diagnosis not present

## 2011-12-01 DIAGNOSIS — M069 Rheumatoid arthritis, unspecified: Secondary | ICD-10-CM | POA: Diagnosis not present

## 2011-12-01 HISTORY — DX: Unspecified cataract: H26.9

## 2011-12-01 LAB — COMPREHENSIVE METABOLIC PANEL
ALT: 11 U/L (ref 0–35)
AST: 15 U/L (ref 0–37)
Albumin: 4 g/dL (ref 3.5–5.2)
Alkaline Phosphatase: 69 U/L (ref 39–117)
Chloride: 104 mEq/L (ref 96–112)
Potassium: 4.9 mEq/L (ref 3.5–5.1)
Sodium: 142 mEq/L (ref 135–145)
Total Bilirubin: 0.4 mg/dL (ref 0.3–1.2)
Total Protein: 7.2 g/dL (ref 6.0–8.3)

## 2011-12-01 LAB — URINALYSIS, ROUTINE W REFLEX MICROSCOPIC
Bilirubin Urine: NEGATIVE
Nitrite: NEGATIVE
Specific Gravity, Urine: 1.015 (ref 1.005–1.030)
Urobilinogen, UA: 0.2 mg/dL (ref 0.0–1.0)
pH: 5.5 (ref 5.0–8.0)

## 2011-12-01 LAB — CBC
HCT: 38.2 % (ref 36.0–46.0)
MCH: 32.2 pg (ref 26.0–34.0)
MCHC: 33.5 g/dL (ref 30.0–36.0)
MCV: 96.2 fL (ref 78.0–100.0)
Platelets: 185 10*3/uL (ref 150–400)
RDW: 13.3 % (ref 11.5–15.5)

## 2011-12-01 LAB — SURGICAL PCR SCREEN
MRSA, PCR: POSITIVE — AB
Staphylococcus aureus: POSITIVE — AB

## 2011-12-01 LAB — URINE MICROSCOPIC-ADD ON

## 2011-12-01 NOTE — Progress Notes (Addendum)
AFTER HAVING GALLBLADDER SURGERY, PT HAD TO HAVE 1 UNIT OF BLOOD?  2007... 1100  STILL NO ORDERS FROM DR. LANDAU

## 2011-12-06 MED ORDER — CEFAZOLIN SODIUM-DEXTROSE 2-3 GM-% IV SOLR
2.0000 g | INTRAVENOUS | Status: AC
  Administered 2011-12-07: 2 g via INTRAVENOUS
  Filled 2011-12-06: qty 50

## 2011-12-07 ENCOUNTER — Inpatient Hospital Stay (HOSPITAL_COMMUNITY): Payer: Medicare Other

## 2011-12-07 ENCOUNTER — Encounter (HOSPITAL_COMMUNITY): Payer: Self-pay | Admitting: Anesthesiology

## 2011-12-07 ENCOUNTER — Encounter (HOSPITAL_COMMUNITY): Payer: Self-pay | Admitting: *Deleted

## 2011-12-07 ENCOUNTER — Ambulatory Visit (HOSPITAL_COMMUNITY): Payer: Medicare Other | Admitting: Anesthesiology

## 2011-12-07 ENCOUNTER — Encounter (HOSPITAL_COMMUNITY): Payer: Self-pay | Admitting: Orthopedic Surgery

## 2011-12-07 ENCOUNTER — Inpatient Hospital Stay (HOSPITAL_COMMUNITY)
Admission: RE | Admit: 2011-12-07 | Discharge: 2011-12-08 | DRG: 483 | Disposition: A | Discharge status: Home Health | Payer: Medicare Other | Source: Ambulatory Visit | Attending: Orthopedic Surgery | Admitting: Orthopedic Surgery

## 2011-12-07 ENCOUNTER — Encounter (HOSPITAL_COMMUNITY)
Admission: RE | Disposition: A | Discharge status: Home / Self Care | Payer: Self-pay | Source: Ambulatory Visit | Attending: Orthopedic Surgery

## 2011-12-07 DIAGNOSIS — Z880 Allergy status to penicillin: Secondary | ICD-10-CM

## 2011-12-07 DIAGNOSIS — F329 Major depressive disorder, single episode, unspecified: Secondary | ICD-10-CM | POA: Diagnosis present

## 2011-12-07 DIAGNOSIS — Z888 Allergy status to other drugs, medicaments and biological substances status: Secondary | ICD-10-CM | POA: Diagnosis not present

## 2011-12-07 DIAGNOSIS — H269 Unspecified cataract: Secondary | ICD-10-CM | POA: Diagnosis present

## 2011-12-07 DIAGNOSIS — M19019 Primary osteoarthritis, unspecified shoulder: Principal | ICD-10-CM | POA: Diagnosis present

## 2011-12-07 DIAGNOSIS — Z833 Family history of diabetes mellitus: Secondary | ICD-10-CM

## 2011-12-07 DIAGNOSIS — K219 Gastro-esophageal reflux disease without esophagitis: Secondary | ICD-10-CM | POA: Diagnosis present

## 2011-12-07 DIAGNOSIS — Z86711 Personal history of pulmonary embolism: Secondary | ICD-10-CM

## 2011-12-07 DIAGNOSIS — Z96619 Presence of unspecified artificial shoulder joint: Secondary | ICD-10-CM | POA: Diagnosis not present

## 2011-12-07 DIAGNOSIS — K589 Irritable bowel syndrome without diarrhea: Secondary | ICD-10-CM | POA: Diagnosis present

## 2011-12-07 DIAGNOSIS — E119 Type 2 diabetes mellitus without complications: Secondary | ICD-10-CM | POA: Diagnosis not present

## 2011-12-07 DIAGNOSIS — Z882 Allergy status to sulfonamides status: Secondary | ICD-10-CM

## 2011-12-07 DIAGNOSIS — IMO0001 Reserved for inherently not codable concepts without codable children: Secondary | ICD-10-CM | POA: Diagnosis present

## 2011-12-07 DIAGNOSIS — Z8249 Family history of ischemic heart disease and other diseases of the circulatory system: Secondary | ICD-10-CM

## 2011-12-07 DIAGNOSIS — Z96659 Presence of unspecified artificial knee joint: Secondary | ICD-10-CM | POA: Diagnosis not present

## 2011-12-07 DIAGNOSIS — M069 Rheumatoid arthritis, unspecified: Secondary | ICD-10-CM | POA: Diagnosis present

## 2011-12-07 DIAGNOSIS — D62 Acute posthemorrhagic anemia: Secondary | ICD-10-CM | POA: Diagnosis not present

## 2011-12-07 DIAGNOSIS — F3289 Other specified depressive episodes: Secondary | ICD-10-CM | POA: Diagnosis present

## 2011-12-07 DIAGNOSIS — Z01812 Encounter for preprocedural laboratory examination: Secondary | ICD-10-CM

## 2011-12-07 DIAGNOSIS — M19012 Primary osteoarthritis, left shoulder: Secondary | ICD-10-CM

## 2011-12-07 DIAGNOSIS — Z471 Aftercare following joint replacement surgery: Secondary | ICD-10-CM | POA: Diagnosis not present

## 2011-12-07 DIAGNOSIS — Z79899 Other long term (current) drug therapy: Secondary | ICD-10-CM | POA: Diagnosis not present

## 2011-12-07 DIAGNOSIS — G8918 Other acute postprocedural pain: Secondary | ICD-10-CM | POA: Diagnosis not present

## 2011-12-07 HISTORY — DX: Primary osteoarthritis, left shoulder: M19.012

## 2011-12-07 HISTORY — PX: TOTAL SHOULDER ARTHROPLASTY: SHX126

## 2011-12-07 LAB — TYPE AND SCREEN: ABO/RH(D): A POS

## 2011-12-07 SURGERY — ARTHROPLASTY, SHOULDER, TOTAL
Anesthesia: General | Site: Shoulder | Laterality: Left | Wound class: Clean

## 2011-12-07 MED ORDER — PROMETHAZINE HCL 25 MG PO TABS: 25.0000 mg | ORAL_TABLET | Freq: Four times a day (QID) | ORAL | Status: DC | PRN

## 2011-12-07 MED ORDER — ACETAMINOPHEN 10 MG/ML IV SOLN
INTRAVENOUS | Status: DC | PRN
  Administered 2011-12-07: 1000 mg via INTRAVENOUS

## 2011-12-07 MED ORDER — ACETAMINOPHEN 10 MG/ML IV SOLN
1000.0000 mg | Freq: Once | INTRAVENOUS | Status: DC
  Filled 2011-12-07: qty 100

## 2011-12-07 MED ORDER — POTASSIUM CHLORIDE IN NACL 20-0.45 MEQ/L-% IV SOLN
INTRAVENOUS | Status: DC
  Administered 2011-12-07: 21:00:00 via INTRAVENOUS
  Filled 2011-12-07 (×3): qty 1000

## 2011-12-07 MED ORDER — PHENYLEPHRINE HCL 10 MG/ML IJ SOLN
INTRAMUSCULAR | Status: DC | PRN
  Administered 2011-12-07 (×2): 80 ug via INTRAVENOUS
  Administered 2011-12-07: 40 ug via INTRAVENOUS

## 2011-12-07 MED ORDER — ZOLPIDEM TARTRATE 5 MG PO TABS: 5.0000 mg | ORAL_TABLET | Freq: Every evening | ORAL | Status: DC | PRN

## 2011-12-07 MED ORDER — BUPIVACAINE HCL (PF) 0.5 % IJ SOLN
INTRAMUSCULAR | Status: DC | PRN
  Administered 2011-12-07: 20 mL

## 2011-12-07 MED ORDER — DIPHENHYDRAMINE HCL 12.5 MG/5ML PO ELIX: 12.5000 mg | ORAL_SOLUTION | ORAL | Status: DC | PRN

## 2011-12-07 MED ORDER — PANTOPRAZOLE SODIUM 40 MG PO TBEC: 80.0000 mg | DELAYED_RELEASE_TABLET | Freq: Every day | ORAL | Status: DC

## 2011-12-07 MED ORDER — PROMETHAZINE HCL 25 MG/ML IJ SOLN: 6.2500 mg | INTRAMUSCULAR | Status: DC | PRN

## 2011-12-07 MED ORDER — FLUCONAZOLE 200 MG PO TABS: 200.0000 mg | ORAL_TABLET | Freq: Every day | ORAL | Status: DC

## 2011-12-07 MED ORDER — RIVAROXABAN 20 MG PO TABS
20.0000 mg | ORAL_TABLET | Freq: Every day | ORAL | Status: DC
  Administered 2011-12-08: 20 mg via ORAL
  Filled 2011-12-07 (×2): qty 1

## 2011-12-07 MED ORDER — HYOSCYAMINE SULFATE 0.125 MG SL SUBL
0.1250 mg | SUBLINGUAL_TABLET | Freq: Four times a day (QID) | SUBLINGUAL | Status: DC | PRN
  Filled 2011-12-07: qty 2

## 2011-12-07 MED ORDER — CEFAZOLIN SODIUM 1-5 GM-% IV SOLN
1.0000 g | Freq: Four times a day (QID) | INTRAVENOUS | Status: AC
  Administered 2011-12-08 (×3): 1 g via INTRAVENOUS
  Filled 2011-12-07 (×4): qty 50

## 2011-12-07 MED ORDER — LACTATED RINGERS IV SOLN
INTRAVENOUS | Status: DC | PRN
  Administered 2011-12-07: 13:00:00 via INTRAVENOUS

## 2011-12-07 MED ORDER — ONDANSETRON HCL 4 MG/2ML IJ SOLN: 4.0000 mg | Freq: Four times a day (QID) | INTRAMUSCULAR | Status: DC | PRN

## 2011-12-07 MED ORDER — OXYCODONE HCL 10 MG PO TB12
10.0000 mg | ORAL_TABLET | Freq: Two times a day (BID) | ORAL | Status: DC
  Administered 2011-12-07 – 2011-12-08 (×2): 10 mg via ORAL
  Filled 2011-12-07 (×2): qty 1

## 2011-12-07 MED ORDER — LIDOCAINE 5 % EX PTCH
3.0000 | MEDICATED_PATCH | CUTANEOUS | Status: DC
  Administered 2011-12-08: 3 via TRANSDERMAL
  Filled 2011-12-07 (×2): qty 3

## 2011-12-07 MED ORDER — HYDROMORPHONE HCL PF 1 MG/ML IJ SOLN
0.2500 mg | INTRAMUSCULAR | Status: DC | PRN
  Administered 2011-12-07 (×2): 0.5 mg via INTRAVENOUS

## 2011-12-07 MED ORDER — ACETAMINOPHEN 650 MG RE SUPP: 650.0000 mg | Freq: Four times a day (QID) | RECTAL | Status: DC | PRN

## 2011-12-07 MED ORDER — METHOCARBAMOL 500 MG PO TABS
500.0000 mg | ORAL_TABLET | Freq: Four times a day (QID) | ORAL | Status: DC | PRN
  Administered 2011-12-07 – 2011-12-08 (×3): 500 mg via ORAL
  Filled 2011-12-07 (×3): qty 1

## 2011-12-07 MED ORDER — PROPOFOL 10 MG/ML IV EMUL
INTRAVENOUS | Status: DC | PRN
  Administered 2011-12-07: 180 mg via INTRAVENOUS

## 2011-12-07 MED ORDER — ROPINIROLE HCL 1 MG PO TABS
2.0000 mg | ORAL_TABLET | Freq: Three times a day (TID) | ORAL | Status: DC
Start: 2011-12-07 — End: 2011-12-08
  Administered 2011-12-07 – 2011-12-08 (×2): 2 mg via ORAL
  Filled 2011-12-07 (×4): qty 2

## 2011-12-07 MED ORDER — ACETAMINOPHEN 10 MG/ML IV SOLN
INTRAVENOUS | Status: AC
  Filled 2011-12-07: qty 100

## 2011-12-07 MED ORDER — ONDANSETRON HCL 4 MG PO TABS: 4.0000 mg | ORAL_TABLET | Freq: Four times a day (QID) | ORAL | Status: DC | PRN

## 2011-12-07 MED ORDER — OXYCODONE-ACETAMINOPHEN 5-325 MG PO TABS
1.0000 | ORAL_TABLET | ORAL | Status: DC | PRN
  Administered 2011-12-07 – 2011-12-08 (×2): 2 via ORAL
  Administered 2011-12-08: 1 via ORAL
  Administered 2011-12-08: 2 via ORAL
  Filled 2011-12-07 (×3): qty 2
  Filled 2011-12-07: qty 1
  Filled 2011-12-07: qty 2

## 2011-12-07 MED ORDER — HYDROMORPHONE HCL PF 1 MG/ML IJ SOLN
INTRAMUSCULAR | Status: AC
  Administered 2011-12-07: 0.5 mg via INTRAVENOUS
  Filled 2011-12-07: qty 1

## 2011-12-07 MED ORDER — LACTATED RINGERS IV SOLN: INTRAVENOUS | Status: DC

## 2011-12-07 MED ORDER — METHOCARBAMOL 500 MG PO TABS: 500.0000 mg | ORAL_TABLET | Freq: Four times a day (QID) | ORAL | Status: AC

## 2011-12-07 MED ORDER — VANCOMYCIN HCL IN DEXTROSE 1-5 GM/200ML-% IV SOLN
1000.0000 mg | Freq: Three times a day (TID) | INTRAVENOUS | Status: AC
  Administered 2011-12-07 – 2011-12-08 (×2): 1000 mg via INTRAVENOUS
  Filled 2011-12-07 (×2): qty 200

## 2011-12-07 MED ORDER — ONDANSETRON HCL 4 MG/2ML IJ SOLN
INTRAMUSCULAR | Status: DC | PRN
  Administered 2011-12-07: 4 mg via INTRAVENOUS

## 2011-12-07 MED ORDER — ROCURONIUM BROMIDE 100 MG/10ML IV SOLN
INTRAVENOUS | Status: DC | PRN
  Administered 2011-12-07: 50 mg via INTRAVENOUS

## 2011-12-07 MED ORDER — FENTANYL CITRATE 0.05 MG/ML IJ SOLN
INTRAMUSCULAR | Status: DC | PRN
  Administered 2011-12-07 (×5): 50 ug via INTRAVENOUS

## 2011-12-07 MED ORDER — METOCLOPRAMIDE HCL 5 MG/ML IJ SOLN: 5.0000 mg | Freq: Three times a day (TID) | INTRAMUSCULAR | Status: DC | PRN

## 2011-12-07 MED ORDER — METOCLOPRAMIDE HCL 10 MG PO TABS: 5.0000 mg | ORAL_TABLET | Freq: Three times a day (TID) | ORAL | Status: DC | PRN

## 2011-12-07 MED ORDER — PHENYLEPHRINE HCL 10 MG/ML IJ SOLN
10.0000 mg | INTRAVENOUS | Status: DC | PRN
  Administered 2011-12-07: 25 ug/min via INTRAVENOUS

## 2011-12-07 MED ORDER — ALUM & MAG HYDROXIDE-SIMETH 200-200-20 MG/5ML PO SUSP: 30.0000 mL | ORAL | Status: DC | PRN

## 2011-12-07 MED ORDER — DOCUSATE SODIUM 100 MG PO CAPS
100.0000 mg | ORAL_CAPSULE | Freq: Two times a day (BID) | ORAL | Status: DC
  Administered 2011-12-08 (×2): 100 mg via ORAL
  Filled 2011-12-07 (×3): qty 1

## 2011-12-07 MED ORDER — OXYCODONE-ACETAMINOPHEN 10-325 MG PO TABS: 1.0000 | ORAL_TABLET | Freq: Four times a day (QID) | ORAL | Status: AC | PRN

## 2011-12-07 MED ORDER — PHENOL 1.4 % MT LIQD: 1.0000 | OROMUCOSAL | Status: DC | PRN

## 2011-12-07 MED ORDER — OXYCODONE HCL 5 MG PO TABS
5.0000 mg | ORAL_TABLET | ORAL | Status: DC | PRN
  Administered 2011-12-08 (×3): 10 mg via ORAL
  Filled 2011-12-07 (×3): qty 2

## 2011-12-07 MED ORDER — HYDROMORPHONE HCL PF 1 MG/ML IJ SOLN: 0.5000 mg | INTRAMUSCULAR | Status: DC | PRN

## 2011-12-07 MED ORDER — ACETAMINOPHEN 325 MG PO TABS
650.0000 mg | ORAL_TABLET | Freq: Four times a day (QID) | ORAL | Status: DC | PRN
  Administered 2011-12-08: 650 mg via ORAL
  Filled 2011-12-07: qty 2

## 2011-12-07 MED ORDER — MENTHOL 3 MG MT LOZG: 1.0000 | LOZENGE | OROMUCOSAL | Status: DC | PRN

## 2011-12-07 MED ORDER — EPHEDRINE SULFATE 50 MG/ML IJ SOLN
INTRAMUSCULAR | Status: DC | PRN
  Administered 2011-12-07: 10 mg via INTRAVENOUS

## 2011-12-07 MED ORDER — MAGNESIUM CITRATE PO SOLN
1.0000 | Freq: Once | ORAL | Status: AC | PRN
  Filled 2011-12-07: qty 296

## 2011-12-07 MED ORDER — POLYETHYLENE GLYCOL 3350 17 G PO PACK: 17.0000 g | PACK | Freq: Every day | ORAL | Status: DC | PRN

## 2011-12-07 MED ORDER — ESTRADIOL 25 MCG VA TABS: 25.0000 ug | ORAL_TABLET | VAGINAL | Status: DC

## 2011-12-07 MED ORDER — HYDROXYCHLOROQUINE SULFATE 200 MG PO TABS
200.0000 mg | ORAL_TABLET | Freq: Every day | ORAL | Status: DC
  Administered 2011-12-07 – 2011-12-08 (×2): 200 mg via ORAL
  Filled 2011-12-07 (×2): qty 1

## 2011-12-07 MED ORDER — VASOPRESSIN 20 UNIT/ML IJ SOLN
0.0300 [IU]/min | INTRAVENOUS | Status: DC
  Filled 2011-12-07: qty 2.5

## 2011-12-07 MED ORDER — SORBITOL 70 % SOLN: 30.0000 mL | Freq: Every day | Status: DC | PRN

## 2011-12-07 MED ORDER — TRIAZOLAM 0.25 MG PO TABS: 0.2500 mg | ORAL_TABLET | Freq: Every evening | ORAL | Status: DC | PRN

## 2011-12-07 MED ORDER — METHOCARBAMOL 100 MG/ML IJ SOLN
500.0000 mg | Freq: Four times a day (QID) | INTRAVENOUS | Status: DC | PRN
  Filled 2011-12-07: qty 5

## 2011-12-07 MED ORDER — FENTANYL CITRATE 0.05 MG/ML IJ SOLN: 50.0000 ug | INTRAMUSCULAR | Status: DC | PRN

## 2011-12-07 MED ORDER — SENNA 8.6 MG PO TABS
1.0000 | ORAL_TABLET | Freq: Two times a day (BID) | ORAL | Status: DC
  Administered 2011-12-07 – 2011-12-08 (×2): 8.6 mg via ORAL
  Filled 2011-12-07 (×3): qty 1

## 2011-12-07 SURGICAL SUPPLY — 68 items
APL SKNCLS STERI-STRIP NONHPOA (GAUZE/BANDAGES/DRESSINGS) ×1
BENZOIN TINCTURE PRP APPL 2/3 (GAUZE/BANDAGES/DRESSINGS) ×2 IMPLANT
BIT DRILL QUICK RELEASE PRPHRL (DRILL) IMPLANT
BLADE SAW SAG 29X58X.64 (BLADE) ×2 IMPLANT
BOOTCOVER CLEANROOM LRG (PROTECTIVE WEAR) ×4 IMPLANT
BOWL SMART MIX CTS (DISPOSABLE) ×1 IMPLANT
BRUSH FEMORAL CANAL (MISCELLANEOUS) IMPLANT
CEMENT BONE DEPUY (Cement) ×2 IMPLANT
CLOTH BEACON ORANGE TIMEOUT ST (SAFETY) ×2 IMPLANT
CLSR STERI-STRIP ANTIMIC 1/2X4 (GAUZE/BANDAGES/DRESSINGS) ×1 IMPLANT
COVER SURGICAL LIGHT HANDLE (MISCELLANEOUS) ×2 IMPLANT
COVER TABLE BACK 60X90 (DRAPES) IMPLANT
DRAPE C-ARM 42X72 X-RAY (DRAPES) IMPLANT
DRAPE INCISE IOBAN 66X45 STRL (DRAPES) ×2 IMPLANT
DRAPE U-SHAPE 47X51 STRL (DRAPES) ×2 IMPLANT
DRILL QUICK RELEASE PERIPHERAL (DRILL) ×6
DRSG MEPILEX BORDER 4X8 (GAUZE/BANDAGES/DRESSINGS) IMPLANT
DRSG PAD ABDOMINAL 8X10 ST (GAUZE/BANDAGES/DRESSINGS) ×2 IMPLANT
DURAPREP 26ML APPLICATOR (WOUND CARE) ×2 IMPLANT
ELECT BLADE 6.5 EXT (BLADE) IMPLANT
ELECT NDL TIP 2.8 STRL (NEEDLE) ×1 IMPLANT
ELECT NEEDLE TIP 2.8 STRL (NEEDLE) ×2 IMPLANT
ELECT REM PT RETURN 9FT ADLT (ELECTROSURGICAL) ×2
ELECTRODE REM PT RTRN 9FT ADLT (ELECTROSURGICAL) ×1 IMPLANT
EVACUATOR 1/8 PVC DRAIN (DRAIN) IMPLANT
FACESHIELD LNG OPTICON STERILE (SAFETY) IMPLANT
GLOVE BIOGEL PI IND STRL 8 (GLOVE) ×2 IMPLANT
GLOVE BIOGEL PI INDICATOR 8 (GLOVE) ×2
GLOVE ORTHO TXT STRL SZ7.5 (GLOVE) ×1 IMPLANT
GLOVE SURG ORTHO 8.0 STRL STRW (GLOVE) ×4 IMPLANT
GOWN STRL NON-REIN LRG LVL3 (GOWN DISPOSABLE) IMPLANT
HANDPIECE INTERPULSE COAX TIP (DISPOSABLE) ×2
HOOD PEEL AWAY FACE SHEILD DIS (HOOD) ×4 IMPLANT
KIT BASIN OR (CUSTOM PROCEDURE TRAY) ×2 IMPLANT
KIT ROOM TURNOVER OR (KITS) ×2 IMPLANT
MANIFOLD NEPTUNE II (INSTRUMENTS) ×2 IMPLANT
NDL 1/2 CIR CATGUT .05X1.09 (NEEDLE) ×1 IMPLANT
NDL HYPO 25GX1X1/2 BEV (NEEDLE) ×1 IMPLANT
NEEDLE 1/2 CIR CATGUT .05X1.09 (NEEDLE) ×2 IMPLANT
NEEDLE HYPO 25GX1X1/2 BEV (NEEDLE) ×2 IMPLANT
NS IRRIG 1000ML POUR BTL (IV SOLUTION) ×2 IMPLANT
PACK SHOULDER (CUSTOM PROCEDURE TRAY) ×2 IMPLANT
PAD ARMBOARD 7.5X6 YLW CONV (MISCELLANEOUS) ×4 IMPLANT
PIN STEINMANN THREADED TIP (PIN) ×1 IMPLANT
PIN THREADED REVERSE (PIN) ×1 IMPLANT
RETRIEVER SUT HEWSON (MISCELLANEOUS) IMPLANT
SET HNDPC FAN SPRY TIP SCT (DISPOSABLE) IMPLANT
SLING ARM IMMOBILIZER LRG (SOFTGOODS) IMPLANT
SLING ARM IMMOBILIZER MED (SOFTGOODS) ×1 IMPLANT
SMARTMIX MINI TOWER (MISCELLANEOUS)
SPONGE GAUZE 4X4 12PLY (GAUZE/BANDAGES/DRESSINGS) ×2 IMPLANT
SPONGE LAP 18X18 X RAY DECT (DISPOSABLE) ×2 IMPLANT
STRIP CLOSURE SKIN 1/2X4 (GAUZE/BANDAGES/DRESSINGS) ×2 IMPLANT
SUCTION FRAZIER TIP 10 FR DISP (SUCTIONS) ×2 IMPLANT
SUPPORT WRAP ARM LG (MISCELLANEOUS) ×2 IMPLANT
SUT FIBERWIRE #2 38 T-5 BLUE (SUTURE) ×6
SUT MNCRL AB 4-0 PS2 18 (SUTURE) ×2 IMPLANT
SUT VIC AB 2-0 CT1 27 (SUTURE) ×2
SUT VIC AB 2-0 CT1 TAPERPNT 27 (SUTURE) ×1 IMPLANT
SUTURE FIBERWR #2 38 T-5 BLUE (SUTURE) ×3 IMPLANT
SYR CONTROL 10ML LL (SYRINGE) ×2 IMPLANT
TAPE CLOTH SURG 4X10 WHT LF (GAUZE/BANDAGES/DRESSINGS) ×1 IMPLANT
TOWEL OR 17X24 6PK STRL BLUE (TOWEL DISPOSABLE) ×2 IMPLANT
TOWEL OR 17X26 10 PK STRL BLUE (TOWEL DISPOSABLE) ×2 IMPLANT
TOWER SMARTMIX MINI (MISCELLANEOUS) IMPLANT
TRAY FOLEY BAG SILVER LF 14FR (CATHETERS) ×1 IMPLANT
TRAY FOLEY CATH 14FR (SET/KITS/TRAYS/PACK) IMPLANT
WATER STERILE IRR 1000ML POUR (IV SOLUTION) ×2 IMPLANT

## 2011-12-07 NOTE — Progress Notes (Signed)
11l:10   SPOKE WITH DR. LANDAU CONCERNING SEVERAL ISSUES...  PT STATES SHE IS ALLERGIC TO PCN...HE WANTS TO CONTINUE ON WITH ANCEF 2 GM..."THEY CAN GIVE A TEST DOSE IN THE OR".... PT STATES THAT SHE HAS ALWAYS LOST BLOOD DURING HER SURGERIES...TYPE & SCREEN TO BE DONE.  da

## 2011-12-07 NOTE — Anesthesia Preprocedure Evaluation (Addendum)
Anesthesia Evaluation  Patient identified by MRN, date of birth, ID band Patient awake    Reviewed: Allergy & Precautions, H&P , NPO status , Patient's Chart, lab work & pertinent test results  Airway Mallampati: II TM Distance: >3 FB Neck ROM: Full    Dental  (+) Dental Advisory Given and Teeth Intact   Pulmonary pneumonia -, resolved, COPDPE breath sounds clear to auscultation  Pulmonary exam normal       Cardiovascular Rhythm:Regular Rate:Normal     Neuro/Psych Depression  Neuromuscular disease    GI/Hepatic Neg liver ROS, GERD-  Controlled and Medicated,  Endo/Other  Pt denies having DM  Renal/GU negative Renal ROS  negative genitourinary   Musculoskeletal  (+) Arthritis -, Rheumatoid disorders,  Fibromyalgia -, narcotic dependent  Abdominal Normal abdominal exam  (+)   Peds  Hematology   Anesthesia Other Findings   Reproductive/Obstetrics                         Anesthesia Physical Anesthesia Plan  ASA: III  Anesthesia Plan: General   Post-op Pain Management:    Induction: Intravenous  Airway Management Planned: Oral ETT  Additional Equipment:   Intra-op Plan:   Post-operative Plan: Extubation in OR  Informed Consent: I have reviewed the patients History and Physical, chart, labs and discussed the procedure including the risks, benefits and alternatives for the proposed anesthesia with the patient or authorized representative who has indicated his/her understanding and acceptance.   Dental advisory given  Plan Discussed with: CRNA and Surgeon  Anesthesia Plan Comments: (Pt seen and examined. Risks, benefits, procedures explained. Pt accepts. Plan GETA with interscalene.)        Anesthesia Quick Evaluation

## 2011-12-07 NOTE — Anesthesia Postprocedure Evaluation (Signed)
  Anesthesia Post-op Note  Patient: Diane Abbott  Procedure(s) Performed: Procedure(s) (LRB): TOTAL SHOULDER ARTHROPLASTY (Left)  Patient Location: PACU  Anesthesia Type: General  Level of Consciousness: awake  Airway and Oxygen Therapy: Patient Spontanous Breathing  Post-op Pain: mild  Post-op Assessment: Post-op Vital signs reviewed  Post-op Vital Signs: Reviewed  Complications: No apparent anesthesia complications

## 2011-12-07 NOTE — Op Note (Signed)
12/07/2011  5:05 PM  PATIENT:  Diane Abbott    PRE-OPERATIVE DIAGNOSIS:  Osteoarthritis LEFT SHOULDER  POST-OPERATIVE DIAGNOSIS:  Same  PROCEDURE:  Left TOTAL SHOULDER ARTHROPLASTY  SURGEON:  Eulas Post, MD  PHYSICIAN ASSISTANT: Janace Litten, OPA-C, present and scrubbed throughout the case, critical for completion in a timely fashion, and for retraction, instrumentation, and closure.  ANESTHESIA:   General  PREOPERATIVE INDICATIONS:  Diane Abbott is a  77 y.o. female with a diagnosis of DJD LEFT SHOULDER who failed conservative measures and elected for surgical management.  Her preoperative x-rays and CT scan had some concern for rotator cuff arthropathy, and we discussed possible reverse shoulder arthroplasty, depending on intraoperative findings.  The risks benefits and alternatives were discussed with the patient preoperatively including but not limited to the risks of infection, bleeding, nerve injury, cardiopulmonary complications, the need for revision surgery, dislocation, loosening, incomplete relief of pain, among others, and the patient was willing to proceed.   OPERATIVE IMPLANTS: Biomet size 10 mini press-fit humeral stem, size 46+18 Versa-dial humeral head, set in the C position with increased coverage posteriorly, with a small cemented glenoid polyethylene 3 peg implant with a central regenerex noncemented post.   OPERATIVE FINDINGS: Advanced glenohumeral osteoarthritis involving the glenoid and the humeral head with substantial osteophyte formation inferiorly. The superior rotator cuff was present, although fairly thinned, and there may have been a small high-grade partial-thickness tear, but overall the rotator cuff seemed present, and therefore I did not perform a reverse shoulder arthroplasty.   OPERATIVE PROCEDURE: The patient is brought to the operating room and placed in the supine position. General anesthesia was administered. IV antibiotics were given.  A  foley was placed. The upper extremity was prepped and draped in usual sterile fashion. The patient was in a beachchair position with all bony prominences padded.   Time out was performed and a deltopectoral approach was carried out. The biceps tendon was tenodesed to the pectoralis tendon. The subscapularis was released, tagging it with a #2 FiberWire, leaving a cuff of tendon for repair.   The inferior osteophyte was removed, and release of the capsule off of the humeral side was completed. The head was dislocated, and I reamed sequentially. I placed the humeral cutting guide at 30 of retroversion, and then pinned this into place, and made my humeral neck cut. This is at the appropriate level.   I then placed deep retractors and exposed the glenoid. I excised the labrum circumferentially, taking care to protect the axillary nerve inferiorly.   I then placed a guidewire into the center position, controlling appropriate version and inclination. I then reamed over the guidewire with the small reamer, and was satisfied with the preparation. I preserved the subchondral bone in order to maximize the strength and minimize the risk for subsequent subsidence.   I then drilled the central hole for the regenerate peg, and then placed the guide, and then drilled the 3 peripheral peg holes. I had excellent bony circumferential contact. The glenoid itself was extremely shallow however, I had good bone in the superior and anterior holes, although the posterior hole perforated. The central hole is well was just at the very depths of the glenoid vault. During the reaming process I took away almost no bone in order to try and preserve glenoid bone stock.  I then cleaned the glenoid, irrigated it copiously, and then dried it and cemented the prosthesis into place. Excellent seating was achieved. I had full exposure. The  cement cured, and then I turned my attention to the humeral side.   I sequentially broached, up to  the selected size, with the broach set at 30 of retroversion. I then placed the real stem. I trialed with multiple heads, and the above-named component was selected. Increased posterior coverage improved the coverage. The soft tissue tension was appropriate.   I then impacted the real humeral head into place, reduced the head, and irrigated copiously. Excellent stability and range of motion was achieved. I repaired the subscapularis with 4 #2 FiberWire, as well as the rotator interval, and irrigated copiously once more. The subcutaneous tissue was closed with Vicryl including the deltopectoral fascia. The cephalic vein was not patent at the end of the case.  The skin was closed with Steri-Strips and sterile gauze was applied. She had a preoperative nerve block. She tolerated the procedure well and there were no complications.

## 2011-12-07 NOTE — Transfer of Care (Signed)
Immediate Anesthesia Transfer of Care Note  Patient: Diane Abbott  Procedure(s) Performed: Procedure(s) (LRB): TOTAL SHOULDER ARTHROPLASTY (Left)  Patient Location: PACU  Anesthesia Type: General and Regional  Level of Consciousness: awake, alert , oriented and patient cooperative  Airway & Oxygen Therapy: Patient Spontanous Breathing and Patient connected to nasal cannula oxygen  Post-op Assessment: Report given to PACU RN, Post -op Vital signs reviewed and stable and Patient moving all extremities X 4  Post vital signs: Reviewed and stable  Complications: No apparent anesthesia complications

## 2011-12-07 NOTE — Anesthesia Procedure Notes (Signed)
Anesthesia Regional Block:  Interscalene brachial plexus block  Pre-Anesthetic Checklist: ,, timeout performed, Correct Patient, Correct Site, Correct Laterality, Correct Procedure, Correct Position, site marked, Risks and benefits discussed, Surgical consent,  Pre-op evaluation,  Post-op pain management  Laterality: Left and Upper  Prep: chloraprep       Needles:  Injection technique: Single-shot  Needle Type: Echogenic Stimulator Needle     Needle Length: 5cm 5 cm Needle Gauge: 22 and 22 G    Additional Needles:  Procedures: ultrasound guided and nerve stimulator Interscalene brachial plexus block  Nerve Stimulator or Paresthesia:  Response: Bicepts, 0.5 mA, 200 ms, 4 cm  Additional Responses:   Narrative:  Start time: 12/07/2011 1:12 PM End time: 12/07/2011 1:37 PM Injection made incrementally with aspirations every 5 mL.  Performed by: Personally  Anesthesiologist: Jacki Couse  Additional Notes: [id number]  Pt seen and examined. Pt elects regional technique. Risks, benefits, procedures explained. Pt accepts.  Brachial plexus identified using u/s. Interscalene nerve block performed under normal sterile conditions. Total of 20ml 0.5% bupivicaine with aspiration interval of 3-5 ml. No blood, paresthesia or pain on injection. Pt tolerated well. No complications.  Lewie Loron, MD

## 2011-12-07 NOTE — H&P (Signed)
PREOPERATIVE H&P  Chief Complaint: DJD LEFT SHOULDER  HPI: Diane Abbott is a 77 y.o. female who presents for preoperative history and physical with a diagnosis of DJD LEFT SHOULDER. Symptoms are rated as moderate to severe, and have been worsening.  This is significantly impairing activities of daily living.  She has elected for surgical management.   Past Medical History  Diagnosis Date  . Fibromyalgia   . Pulmonary embolism   . RA (rheumatoid arthritis)   . Vitamin B 12 deficiency   . Vitamin d deficiency   . Osteoarthritis   . Osteopenia   . Adrenal insufficiency   . IBS (irritable bowel syndrome)   . History of blood clots 2008    below knee  . Diabetes mellitus type II     pt denies this 03-05-11  . Esophagitis   . Depression   . GERD (gastroesophageal reflux disease)   . Connective tissue disorder   . Cataracts, bilateral     more in left than right   Past Surgical History  Procedure Date  . Abdominal hysterectomy   . Total knee arthroplasty     bilateral  . Cholecystectomy 2003  . Tonsillectomy 1941  . Appendectomy 1995  . Wrist surgery     bilateral  . Bilateral oophorectomy   . Tummy tuck   . Cosmetic surgery    History   Social History  . Marital Status: Divorced    Spouse Name: N/A    Number of Children: N/A  . Years of Education: N/A   Occupational History  . retired    Social History Main Topics  . Smoking status: Never Smoker   . Smokeless tobacco: Never Used  . Alcohol Use: Yes  . Drug Use: No  . Sexually Active: Not Currently   Other Topics Concern  . None   Social History Narrative  . None   Family History  Problem Relation Age of Onset  . Ovarian cancer Mother   . Heart disease Maternal Grandmother   . Clotting disorder    . Breast cancer    . Diabetes Mother   . Pancreatic cancer    . COPD Father   . Colon cancer Neg Hx    Allergies  Allergen Reactions  . Latex Itching and Dermatitis    When examined with latex  gloves, burns and itches in contact areas per pt.  . Alprazolam   . Benzodiazepines   . Clonazepam   . Codeine   . Darvocet (Propoxyphene-Acetaminophen)   . Diclofenac Sodium   . Duloxetine     REACTION: achy legs  . Gabapentin   . Morphine And Related   . Nortriptyline Hcl     REACTION: HA  . Penicillins     REACTION: Can take Cephalosporins  . Pramipexole Dihydrochloride     REACTION: sick, falling  . Prednisone   . Rofecoxib   . Sulfonamide Derivatives   . Venlafaxine    Prior to Admission medications   Medication Sig Start Date End Date Taking? Authorizing Provider  albuterol-ipratropium (COMBIVENT) 18-103 MCG/ACT inhaler Inhale 2 puffs into the lungs every 6 (six) hours as needed. For shortness of breath   Yes Historical Provider, MD  esomeprazole (NEXIUM) 40 MG capsule Take 40 mg by mouth 2 (two) times daily.     Yes Historical Provider, MD  estradiol (VAGIFEM) 25 MCG vaginal tablet Place 25 mcg vaginally every other day.    Yes Historical Provider, MD  fluconazole (DIFLUCAN) 200 MG  tablet Take 1 tablet (200 mg total) by mouth daily. 09/17/11  Yes Georgina Quint Plotnikov, MD  hydroxychloroquine (PLAQUENIL) 200 MG tablet Take 200 mg by mouth daily.   Yes Historical Provider, MD  lidocaine (LIDODERM) 5 % Place 3 patches onto the skin daily. Remove & Discard patch within 12 hours or as directed by MD   Yes Historical Provider, MD  oxyCODONE (OXYCONTIN) 10 MG 12 hr tablet Take 1 tablet (10 mg total) by mouth every 12 (twelve) hours as needed for pain. Please fill on 12/18/11 11/17/11  Yes Georgina Quint Plotnikov, MD  Rivaroxaban (XARELTO) 20 MG TABS Take 20 mg by mouth daily.   Yes Historical Provider, MD  rOPINIRole (REQUIP) 2 MG tablet Take 2 mg by mouth 3 (three) times daily.   Yes Historical Provider, MD  hyoscyamine (LEVSIN SL) 0.125 MG SL tablet Place 1-2 tablets (0.125-0.25 mg total) under the tongue 4 (four) times daily as needed. 06/19/11   Georgina Quint Plotnikov, MD  naproxen sodium  (ANAPROX) 220 MG tablet Take 220 mg by mouth as needed. For pain    Historical Provider, MD  triazolam (HALCION) 0.25 MG tablet Take 1 tablet (0.25 mg total) by mouth at bedtime as needed. 06/19/11 02/01/12  Georgina Quint Plotnikov, MD     Positive ROS: All other systems have been reviewed and were otherwise negative with the exception of those mentioned in the HPI and as above.  Physical Exam: General: Alert, no acute distress Cardiovascular: No pedal edema Respiratory: No cyanosis, no use of accessory musculature GI: No organomegaly, abdomen is soft and non-tender Skin: No lesions in the area of chief complaint Neurologic: Sensation intact distally Psychiatric: Patient is competent for consent with normal mood and affect Lymphatic: No axillary or cervical lymphadenopathy  MUSCULOSKELETAL: AROM 0-90, positive crepitance, left shoulder.  Assessment: DJD LEFT SHOULDER  Plan: Plan for Procedure(s): TOTAL SHOULDER ARTHROPLASTY  The risks benefits and alternatives were discussed with the patient including but not limited to the risks of nonoperative treatment, versus surgical intervention including infection, bleeding, nerve injury,  blood clots, cardiopulmonary complications, morbidity, mortality, among others, and they were willing to proceed.   Angelice Piech P, MD Cell [phone] Pager [phone]  12/07/2011 2:14 PM

## 2011-12-08 LAB — CBC
HCT: 30.1 % — ABNORMAL LOW (ref 36.0–46.0)
MCV: 96.8 fL (ref 78.0–100.0)
Platelets: 139 10*3/uL — ABNORMAL LOW (ref 150–400)
RBC: 3.11 MIL/uL — ABNORMAL LOW (ref 3.87–5.11)
RDW: 13.4 % (ref 11.5–15.5)
WBC: 4.2 10*3/uL (ref 4.0–10.5)

## 2011-12-08 LAB — BASIC METABOLIC PANEL
CO2: 28 mEq/L (ref 19–32)
Chloride: 104 mEq/L (ref 96–112)
Creatinine, Ser: 1.01 mg/dL (ref 0.50–1.10)
GFR calc Af Amer: 62 mL/min — ABNORMAL LOW (ref >90)
Potassium: 4.2 mEq/L (ref 3.5–5.1)

## 2011-12-08 MED ORDER — IPRATROPIUM-ALBUTEROL 18-103 MCG/ACT IN AERO
2.0000 | INHALATION_SPRAY | Freq: Four times a day (QID) | RESPIRATORY_TRACT | Status: DC | PRN
Start: 2011-12-08 — End: 2011-12-08
  Filled 2011-12-08 (×2): qty 14.7

## 2011-12-08 MED ORDER — HYDROMORPHONE HCL 2 MG PO TABS
2.0000 mg | ORAL_TABLET | ORAL | Status: DC | PRN
  Administered 2011-12-08: 2 mg via ORAL
  Filled 2011-12-08: qty 1

## 2011-12-08 MED ORDER — DIAZEPAM 5 MG PO TABS: 5.0000 mg | ORAL_TABLET | Freq: Three times a day (TID) | ORAL | Status: DC | PRN

## 2011-12-08 MED ORDER — HYDROMORPHONE HCL 2 MG PO TABS: 2.0000 mg | ORAL_TABLET | ORAL | Status: AC | PRN

## 2011-12-08 NOTE — Progress Notes (Addendum)
Occupational Therapy Treatment Patient Details Name: Diane Abbott MRN: [id number] DOB: 1934-11-04 Today's Date: 12/08/2011 Time: 6578-4696 OT Time Calculation (min): 25 min  OT Assessment / Plan / Recommendation Comments on Treatment Session Apparent c/o pain. Pt rates pain at 10, but is dozing off during session. Pt's primary caregiver verbalized understanding of all aspects of OT. Pt to continue with HHOT after D/C to return to I living status.    Follow Up Recommendations  Home health OT    Barriers to Discharge  None    Equipment Recommendations  None recommended by OT    Recommendations for Other Services    Frequency Min 2X/week   Plan Discharge plan remains appropriate    Precautions / Restrictions Precautions Precautions: Shoulder Type of Shoulder Precautions: no shoulder ROM. E/W/H only Precaution Booklet Issued: Yes (comment) Required Braces or Orthoses: Other Brace/Splint (sling) Restrictions Weight Bearing Restrictions: Yes LUE Weight Bearing: Non weight bearing   Pertinent Vitals/Pain 10    ADL  Eating/Feeding: Performed;Set up Where Assessed - Eating/Feeding: Bed level Grooming: Performed;Minimal assistance Where Assessed - Grooming: Unsupported standing Upper Body Bathing: Simulated;Maximal assistance Where Assessed - Upper Body Bathing: Supported sitting Lower Body Bathing: Simulated;Minimal assistance Where Assessed - Lower Body Bathing: Supported sit to stand Upper Body Dressing: Simulated;Maximal assistance Where Assessed - Upper Body Dressing: Supported sitting Lower Body Dressing: Simulated;Minimal assistance Where Assessed - Lower Body Dressing: Unsupported sit to stand Toilet Transfer: Performed;Supervision/safety Toilet Transfer Method: Sit to Barista: Comfort height toilet Toileting - Clothing Manipulation and Hygiene: Performed;Moderate assistance Where Assessed - Toileting Clothing Manipulation and Hygiene:  Standing Transfers/Ambulation Related to ADLs: S ADL Comments: pt/sister educated in shoulder protocol, including technique for donning/doffing shirt and sling. Able to return demonstrate. Reviewed all precautions, including NWB L UE and ROM with E/W/H only. Also educated on proper positioning of LUE in bed and in sitting. Able to demonstrate correct use of sling. written information given    OT Diagnosis: Generalized weakness;Acute pain  OT Problem List: Decreased strength;Decreased range of motion;Decreased activity tolerance;Decreased coordination;Decreased knowledge of precautions;Impaired UE functional use;Pain OT Treatment Interventions: Self-care/ADL training;Therapeutic exercise;Therapeutic activities;Patient/family education   OT Goals Acute Rehab OT Goals OT Goal Formulation: With patient Time For Goal Achievement: 12/15/11 Potential to Achieve Goals: Good ADL Goals Pt Will Perform Upper Body Bathing: with caregiver independent in assisting;Sitting, edge of bed;Unsupported ADL Goal: Upper Body Bathing - Progress: Met Pt Will Perform Upper Body Dressing: with caregiver independent in assisting;Sit to stand from bed;Unsupported ADL Goal: Upper Body Dressing - Progress: Met Additional ADL Goal #1: pt/family independent in donning/doffing sling ADL Goal: Additional Goal #1 - Progress: Met Arm Goals Pt Will Perform AROM: with supervision, verbal cues required/provided;Left upper extremity;1 set;to maintain range of motion;Other (comment) Arm Goal: AROM - Progress: Met  Visit Information  Last OT Received On: 12/08/11 Assistance Needed: +1    Subjective Data      Prior Functioning  Home Living Lives With: Family Available Help at Discharge: Available 24 hours/day Type of Home: House Home Access: Stairs to enter [employer] of Steps: 2 Home Layout: One level Bathroom Shower/Tub: Engineer, manufacturing systems: Standard Bathroom Accessibility: Yes Home Adaptive  Equipment: Hospital bed;Straight cane Prior Function Level of Independence: Independent Able to Take Stairs?: Yes Driving: Yes Communication Communication: No difficulties Dominant Hand: Right    Cognition  Overall Cognitive Status: Appears within functional limits for tasks assessed/performed Arousal/Alertness: Awake/alert Orientation Level: Appears intact for tasks assessed Behavior During Session:  Agitated    Mobility Bed Mobility Bed Mobility: Supine to Sit Supine to Sit: HOB elevated;With rails;5: Supervision Transfers Transfers: Sit to Stand;Stand to Sit Sit to Stand: 6: Modified independent (Device/Increase time) Stand to Sit: 6: Modified independent (Device/Increase time)   Exercises  E/W/H ROM only. No shoulder ROM. Sling at all times with the exception of exercise and ADL.  Balance  WFL  End of Session OT - End of Session Equipment Utilized During Treatment: Other (comment) (sling) Activity Tolerance: Patient tolerated treatment well Patient left: in bed;with call bell/phone within reach;with family/visitor present Nurse Communication: Other (comment) (assist with IV)  GO     Aunika Kirsten,HILLARY 12/08/2011, 12:29 PM [facility], OTR/L  [phone] 12/08/2011

## 2011-12-08 NOTE — Progress Notes (Signed)
Patient ID: Diane Abbott, female   DOB: 1934/09/10, 77 y.o.   MRN: [id number]     Subjective:  She denies any CP but does state that she feels like she needs her inhaler. Complains of severe pain and anxiety.  Objective:   VITALS:   Filed Vitals:   12/07/11 1847 12/07/11 2200 12/08/11 0155 12/08/11 0632  BP: 95/38 121/75 88/38 110/37  Pulse: 88 107 81 74  Temp: 97.2 F (36.2 C) 98.1 F (36.7 C) 97.4 F (36.3 C) 97.1 F (36.2 C)  TempSrc: Oral Oral Oral Oral  Resp: 16 16 15 15   SpO2: 96% 95% 95% 96%    ABD soft Sensation intact distally Dorsiflexion/Plantar flexion intact Incision: dressing C/D/I and no drainage  LABS  Results for orders placed during the hospital encounter of 12/07/11 (from the past 24 hour(s))  TYPE AND SCREEN     Status: Normal   Collection Time   12/07/11 11:15 AM      Component Value Range   ABO/RH(D) A POS     Antibody Screen NEG     Sample Expiration 12/10/2011    ABO/RH     Status: Normal   Collection Time   12/07/11 11:15 AM      Component Value Range   ABO/RH(D) A POS    GLUCOSE, CAPILLARY     Status: Abnormal   Collection Time   12/07/11  8:59 PM      Component Value Range   Glucose-Capillary 157 (*) 70 - 99 mg/dL  CBC     Status: Abnormal   Collection Time   12/08/11  6:58 AM      Component Value Range   WBC 4.2  4.0 - 10.5 K/uL   RBC 3.11 (*) 3.87 - 5.11 MIL/uL   Hemoglobin 9.9 (*) 12.0 - 15.0 g/dL   HCT 52.8 (*) 41.3 - 24.4 %   MCV 96.8  78.0 - 100.0 fL   MCH 31.8  26.0 - 34.0 pg   MCHC 32.9  30.0 - 36.0 g/dL   RDW 01.0  27.2 - 53.6 %   Platelets 139 (*) 150 - 400 K/uL  BASIC METABOLIC PANEL     Status: Abnormal   Collection Time   12/08/11  6:58 AM      Component Value Range   Sodium 138  135 - 145 mEq/L   Potassium 4.2  3.5 - 5.1 mEq/L   Chloride 104  96 - 112 mEq/L   CO2 28  19 - 32 mEq/L   Glucose, Bld 127 (*) 70 - 99 mg/dL   BUN 13  6 - 23 mg/dL   Creatinine, Ser 6.44  0.50 - 1.10 mg/dL   Calcium 8.4  8.4 - 03.4  mg/dL   GFR calc non Af Amer 53 (*) >90 mL/min   GFR calc Af Amer 62 (*) >90 mL/min    Dg Shoulder Left Port  12/07/2011  *RADIOLOGY REPORT*  Clinical Data: Postop left shoulder arthroplasty.  PORTABLE LEFT SHOULDER - 2+ VIEW  Comparison: 10/30/2011 CT.  Findings: The patient is status post left total shoulder arthroplasty.  Hardware appears well positioned.  There is an anchor screw anteriorly within the glenoid.  No fracture or dislocation is identified.  IMPRESSION: No demonstrated complication following left total shoulder arthroplasty.  Original Report Authenticated By: Gerrianne Scale, M.D.    Assessment/Plan: 1 Day Post-Op   Principal Problem:  *Osteoarthritis of left shoulder   Advance diet Up with therapy ABLA will continue  to monitor Continue inhaler for her COPD as needed Will plan for DC home with home health today. NWB left upper ext.  will add dilaudid, and valium if really needed.  dc home today.   Haskel Khan 12/08/2011, 8:51 AM   Teryl Lucy, MD Cell [phone] Pager [phone]

## 2011-12-08 NOTE — Progress Notes (Signed)
CARE MANAGEMENT NOTE 12/08/2011  Patient:  Diane Abbott, Diane Abbott   Account Number:  [id number]  Date Initiated:  12/08/2011  Documentation initiated by:  Vance Peper  Subjective/Objective Assessment:   77 yr female s/p left total shoulder arthroplasty     Action/Plan:   CM spoke with patient and her sister regarding HH needs at discharge. Choice offered. Patient states she has used [facility] in the past. Will do so now.   Anticipated DC Date:  12/08/2011   Anticipated DC Plan:  HOME W HOME HEALTH SERVICES      DC Planning Services  CM consult      [facility] [place] Choice  HOME HEALTH   Choice offered to / List presented to:  C-1 Patient        HH arranged  HH-1 RN  HH-2 PT  HH-3 OT      [facility] agency  [facility]   Status of service:  Completed, signed off Medicare Important Message given?   (If response is "NO", the following Medicare IM given date fields will be blank) Date Medicare IM given:   Date Additional Medicare IM given:    Discharge Disposition:  HOME W HOME HEALTH SERVICES  Per UR Regulation:    If discussed at Long Length of Stay Meetings, dates discussed:    Comments:

## 2011-12-08 NOTE — Progress Notes (Signed)
Occupational Therapy Evaluation Patient Details Name: NANCI LAKATOS MRN: [id number] DOB: [birth year] Today's Date: 12/08/2011 Time:  -     OT Assessment / Plan / Recommendation Clinical Impression  77 yo s/p L TSA. Pt painful this am. pain meds given. will return again later this am for completion of education. Pt will benefitf rom skilled Ot services to max independence with ADL and functional mobility for ADL to facilitate D/C home with 24/7 S of sister.    OT Assessment  Patient needs continued OT Services    Follow Up Recommendations  Home health OT    Barriers to Discharge None    Equipment Recommendations  None recommended by OT    Recommendations for Other Services    Frequency  Min 2X/week    Precautions / Restrictions Precautions Precautions: Shoulder Type of Shoulder Precautions: no shoulder ROM. E/W/H only Precaution Booklet Issued: Yes (comment) Required Braces or Orthoses: Other Brace/Splint (sling) Restrictions Weight Bearing Restrictions: Yes LUE Weight Bearing: Non weight bearing   Pertinent Vitals/Pain 10    ADL  Eating/Feeding: Performed;Set up Where Assessed - Eating/Feeding: Bed level Grooming: Performed;Minimal assistance Where Assessed - Grooming: Unsupported standing Upper Body Bathing: Simulated;Maximal assistance Where Assessed - Upper Body Bathing: Supported sitting Lower Body Bathing: Simulated;Minimal assistance Where Assessed - Lower Body Bathing: Supported sit to stand Upper Body Dressing: Simulated;Maximal assistance Where Assessed - Upper Body Dressing: Supported sitting Lower Body Dressing: Simulated;Minimal assistance Where Assessed - Lower Body Dressing: Unsupported sit to stand Toilet Transfer: Performed;Supervision/safety Toilet Transfer Method: Sit to Barista: Comfort height toilet Toileting - Clothing Manipulation and Hygiene: Performed;Moderate assistance Where Assessed - Toileting Clothing  Manipulation and Hygiene: Standing Transfers/Ambulation Related to ADLs: S ADL Comments: decreased knowledge of compensatory techniques and shoulder precautions    OT Diagnosis: Generalized weakness;Acute pain  OT Problem List: Decreased strength;Decreased range of motion;Decreased activity tolerance;Decreased coordination;Decreased knowledge of precautions;Impaired UE functional use;Pain OT Treatment Interventions: Self-care/ADL training;Therapeutic exercise;Therapeutic activities;Patient/family education   OT Goals Acute Rehab OT Goals OT Goal Formulation: With patient Time For Goal Achievement: 12/15/11 Potential to Achieve Goals: Good ADL Goals Pt Will Perform Upper Body Bathing: with caregiver independent in assisting;Sitting, edge of bed;Unsupported ADL Goal: Upper Body Bathing - Progress: Goal set today Pt Will Perform Upper Body Dressing: with caregiver independent in assisting;Sit to stand from bed;Unsupported ADL Goal: Upper Body Dressing - Progress: Goal set today Additional ADL Goal #1: pt/family independent in donning/doffing sling ADL Goal: Additional Goal #1 - Progress: Goal set today Arm Goals Pt Will Perform AROM: with supervision, verbal cues required/provided;Left upper extremity;1 set;to maintain range of motion;Other (comment) (elbow, wrist and hand only. no shoulder rom) Arm Goal: AROM - Progress: Goal set today  Visit Information  Last OT Received On: 12/08/11    Subjective Data      Prior Functioning  Vision/Perception  Home Living Lives With: Family Available Help at Discharge: Available 24 hours/day Type of Home: House Home Access: Stairs to enter [employer] of Steps: 2 Home Layout: One level Bathroom Shower/Tub: Associate Professor: Yes Home Adaptive Equipment: Hospital bed;Straight cane Prior Function Level of Independence: Independent Able to Take Stairs?: Yes Driving:  Yes Communication Communication: No difficulties Dominant Hand: Right      Cognition  Overall Cognitive Status: Appears within functional limits for tasks assessed/performed Arousal/Alertness: Awake/alert Orientation Level: Appears intact for tasks assessed Behavior During Session: Agitated    Extremity/Trunk Assessment Right Upper Extremity Assessment RUE ROM/Strength/Tone: [facility]  for tasks assessed Left Upper Extremity Assessment LUE ROM/Strength/Tone: Deficits;Due to pain;Due to precautions LUE ROM/Strength/Tone Deficits: no shoulder ROm. All other ROM WFL LUE Sensation: WFL - Light Touch;WFL - Proprioception Right Lower Extremity Assessment RLE ROM/Strength/Tone: WFL for tasks assessed Left Lower Extremity Assessment LLE ROM/Strength/Tone: WFL for tasks assessed Trunk Assessment Trunk Assessment: Normal   Mobility Bed Mobility Bed Mobility: Supine to Sit Supine to Sit: HOB elevated;With rails;5: Supervision Transfers Transfers: Sit to Stand;Stand to Sit Sit to Stand: 6: Modified independent (Device/Increase time) Stand to Sit: 6: Modified independent (Device/Increase time)   Exercise    Balance    End of Session OT - End of Session Activity Tolerance: Patient limited by pain Patient left: in bed;with call bell/phone within reach;with family/visitor present Nurse Communication: Patient requests pain meds  GO     Aysha Livecchi,HILLARY 12/08/2011, 12:23 PM

## 2011-12-08 NOTE — Progress Notes (Signed)
UR COMPLETED  

## 2011-12-08 NOTE — Discharge Summary (Signed)
Physician Discharge Summary  Patient ID: Diane Abbott MRN: [id number] DOB/AGE: 08-25-1934 77 y.o.  Admit date: 12/07/2011 Discharge date: 12/08/2011  Admission Diagnoses:  Osteoarthritis of left shoulder  Discharge Diagnoses:  Principal Problem:  *Osteoarthritis of left shoulder   Past Medical History  Diagnosis Date  . Fibromyalgia   . Pulmonary embolism   . RA (rheumatoid arthritis)   . Vitamin B 12 deficiency   . Vitamin d deficiency   . Osteoarthritis   . Osteopenia   . Adrenal insufficiency   . IBS (irritable bowel syndrome)   . History of blood clots 2008    below knee  . Diabetes mellitus type II     pt denies this 03-05-11  . Esophagitis   . Depression   . GERD (gastroesophageal reflux disease)   . Connective tissue disorder   . Cataracts, bilateral     more in left than right  . Osteoarthritis of left shoulder 12/07/2011    Surgeries: Procedure(s): TOTAL SHOULDER ARTHROPLASTY on 12/07/2011   Consultants (if any):    Discharged Condition: Improved  Hospital Course: Diane Abbott is an 77 y.o. female who was admitted 12/07/2011 with a diagnosis of Osteoarthritis of left shoulder and went to the operating room on 12/07/2011 and underwent the above named procedures.    She was given perioperative antibiotics:  Anti-infectives     Start     Dose/Rate Route Frequency Ordered Stop   12/07/11 2100   ceFAZolin (ANCEF) IVPB 1 g/50 mL premix        1 g 100 mL/hr over 30 Minutes Intravenous Every 6 hours 12/07/11 1856 12/08/11 0850   12/07/11 2000   hydroxychloroquine (PLAQUENIL) tablet 200 mg        200 mg Oral Daily 12/07/11 1856     12/07/11 2000   vancomycin (VANCOCIN) IVPB 1000 mg/200 mL premix        1,000 mg 200 mL/hr over 60 Minutes Intravenous Every 8 hours 12/07/11 1856 12/08/11 0442   12/07/11 1900   fluconazole (DIFLUCAN) tablet 200 mg  Status:  Discontinued        200 mg Oral Daily 12/07/11 1856 12/07/11 1939   12/06/11 1338   ceFAZolin (ANCEF)  IVPB 2 g/50 mL premix        2 g 100 mL/hr over 30 Minutes Intravenous 60 min pre-op 12/06/11 1338 12/07/11 1427        .  She was given sequential compression devices, early ambulation, and xarelto for DVT prophylaxis.  She also had problems with pain control, and has been on oxycontin and percocet for a long time.  Ultimately used po dilaudid, percocet, oxycontin, oxycodone, and also a low dose valium to help with anxiety and pain control.  Not being dcd on valium due to history of respiratory arrest.  Counseled her on the dangers of all these medicines, but she is adamant that she has developed tolerance, and has a nurse for a daughter who will help monitor.    She benefited maximally from the hospital stay and there were no complications.    Recent vital signs:  Filed Vitals:   12/08/11 0632  BP: 110/37  Pulse: 74  Temp: 97.1 F (36.2 C)  Resp: 15    Recent laboratory studies:  Lab Results  Component Value Date   HGB 9.9* 12/08/2011   HGB 12.8 12/01/2011   HGB 13.4 08/19/2011   Lab Results  Component Value Date   WBC 4.2 12/08/2011   PLT 139*  12/08/2011   Lab Results  Component Value Date   INR 0.90* 02/17/2011   Lab Results  Component Value Date   NA 138 12/08/2011   K 4.2 12/08/2011   CL 104 12/08/2011   CO2 28 12/08/2011   BUN 13 12/08/2011   CREATININE 1.01 12/08/2011   GLUCOSE 127* 12/08/2011    Discharge Medications:   Medication List  As of 12/08/2011 10:38 AM   STOP taking these medications         naproxen sodium 220 MG tablet         TAKE these medications         albuterol-ipratropium 18-103 MCG/ACT inhaler   Commonly known as: COMBIVENT   Inhale 2 puffs into the lungs every 6 (six) hours as needed. For shortness of breath      esomeprazole 40 MG capsule   Commonly known as: NEXIUM   Take 40 mg by mouth 2 (two) times daily.      estradiol 25 MCG vaginal tablet   Commonly known as: VAGIFEM   Place 25 mcg vaginally every other day.      fluconazole 200 MG  tablet   Commonly known as: DIFLUCAN   Take 1 tablet (200 mg total) by mouth daily.      HYDROmorphone 2 MG tablet   Commonly known as: DILAUDID   Take 1 tablet (2 mg total) by mouth every 4 (four) hours as needed for pain.      hydroxychloroquine 200 MG tablet   Commonly known as: PLAQUENIL   Take 200 mg by mouth daily.      hyoscyamine 0.125 MG SL tablet   Commonly known as: LEVSIN SL   Place 1-2 tablets (0.125-0.25 mg total) under the tongue 4 (four) times daily as needed.      lidocaine 5 %   Commonly known as: LIDODERM   Place 3 patches onto the skin daily. Remove & Discard patch within 12 hours or as directed by MD      methocarbamol 500 MG tablet   Commonly known as: ROBAXIN   Take 1 tablet (500 mg total) by mouth 4 (four) times daily.      oxyCODONE 10 MG 12 hr tablet   Commonly known as: OXYCONTIN   Take 1 tablet (10 mg total) by mouth every 12 (twelve) hours as needed for pain. Please fill on 12/18/11      oxyCODONE-acetaminophen 10-325 MG per tablet   Commonly known as: PERCOCET   Take 1-2 tablets by mouth every 6 (six) hours as needed for pain. MAXIMUM TOTAL ACETAMINOPHEN DOSE IS 4000 MG PER DAY.  PLEASE DISPENSE THE TRADE DRUG, NOT GENERIC.      promethazine 25 MG tablet   Commonly known as: PHENERGAN   Take 1 tablet (25 mg total) by mouth every 6 (six) hours as needed for nausea.      Rivaroxaban 20 MG Tabs   Commonly known as: XARELTO   Take 20 mg by mouth daily.      rOPINIRole 2 MG tablet   Commonly known as: REQUIP   Take 2 mg by mouth 3 (three) times daily.      triazolam 0.25 MG tablet   Commonly known as: HALCION   Take 1 tablet (0.25 mg total) by mouth at bedtime as needed.            Diagnostic Studies: Dg Shoulder Left Port  12/07/2011  *RADIOLOGY REPORT*  Clinical Data: Postop left shoulder arthroplasty.  PORTABLE LEFT  SHOULDER - 2+ VIEW  Comparison: 10/30/2011 CT.  Findings: The patient is status post left total shoulder arthroplasty.   Hardware appears well positioned.  There is an anchor screw anteriorly within the glenoid.  No fracture or dislocation is identified.  IMPRESSION: No demonstrated complication following left total shoulder arthroplasty.  Original Report Authenticated By: Gerrianne Scale, M.D.    Disposition:   Discharge Orders    Future Appointments: Provider: Department: Dept Phone: Center:   01/19/2012 1:15 PM Tresa Garter, MD [facility] [phone] [facility]   02/18/2012 9:00 AM Sherrie Mustache [facility] [phone] None   02/18/2012 9:30 AM Pierce Crane, MD [facility] [phone] None      Follow-up Information    Follow up with Eulas Post, MD in 2 weeks.   Contact information:   [facility] [address], [address] [place] [address] [phone]           Signed: Eulas Post 12/08/2011, 10:38 AM

## 2011-12-08 NOTE — Progress Notes (Signed)
OT NOTE  Attempted to see pt this am. Pt having difficulty with pain control. Discussed with nursing. Pain meds given. Will return later this am. [facility], OTR/L  [phone] 12/08/2011

## 2011-12-09 ENCOUNTER — Encounter (HOSPITAL_COMMUNITY): Payer: Self-pay | Admitting: Orthopedic Surgery

## 2011-12-09 DIAGNOSIS — J449 Chronic obstructive pulmonary disease, unspecified: Secondary | ICD-10-CM | POA: Diagnosis not present

## 2011-12-09 DIAGNOSIS — M069 Rheumatoid arthritis, unspecified: Secondary | ICD-10-CM | POA: Diagnosis not present

## 2011-12-09 DIAGNOSIS — Z96619 Presence of unspecified artificial shoulder joint: Secondary | ICD-10-CM | POA: Diagnosis not present

## 2011-12-09 DIAGNOSIS — E119 Type 2 diabetes mellitus without complications: Secondary | ICD-10-CM | POA: Diagnosis not present

## 2011-12-09 DIAGNOSIS — Z471 Aftercare following joint replacement surgery: Secondary | ICD-10-CM | POA: Diagnosis not present

## 2011-12-09 DIAGNOSIS — IMO0001 Reserved for inherently not codable concepts without codable children: Secondary | ICD-10-CM | POA: Diagnosis not present

## 2011-12-11 ENCOUNTER — Telehealth: Payer: Self-pay | Admitting: *Deleted

## 2011-12-11 NOTE — Telephone Encounter (Signed)
Caller requesting the status of pharmacy assistance forms for pt's Enbrel and Lovenox. Please advise.

## 2011-12-11 NOTE — Telephone Encounter (Signed)
i haven't seen them Thx

## 2011-12-11 NOTE — Telephone Encounter (Signed)
Left detailed mess informing Marcelino Duster at [facility] to please fax forms back to [phone].

## 2011-12-14 ENCOUNTER — Other Ambulatory Visit: Payer: Self-pay | Admitting: Internal Medicine

## 2011-12-21 DIAGNOSIS — M19019 Primary osteoarthritis, unspecified shoulder: Secondary | ICD-10-CM | POA: Diagnosis not present

## 2011-12-21 DIAGNOSIS — Z471 Aftercare following joint replacement surgery: Secondary | ICD-10-CM | POA: Diagnosis not present

## 2012-01-05 ENCOUNTER — Telehealth: Payer: Self-pay | Admitting: *Deleted

## 2012-01-05 NOTE — Telephone Encounter (Signed)
You wanted to know what does of Lovenox pt was on. She states she was not on Lovenox. She is on Xarelto 20 mg qd.

## 2012-01-05 NOTE — Telephone Encounter (Signed)
Why did I get the forms for Lovenox pt assistance? -- please disregard Thx

## 2012-01-11 ENCOUNTER — Other Ambulatory Visit: Payer: Self-pay | Admitting: Internal Medicine

## 2012-01-12 ENCOUNTER — Other Ambulatory Visit: Payer: Self-pay | Admitting: *Deleted

## 2012-01-12 MED ORDER — ROPINIROLE HCL 2 MG PO TABS: 1.0000 mg | ORAL_TABLET | Freq: Three times a day (TID) | ORAL | Status: DC

## 2012-01-19 ENCOUNTER — Other Ambulatory Visit (INDEPENDENT_AMBULATORY_CARE_PROVIDER_SITE_OTHER): Payer: Medicare Other

## 2012-01-19 ENCOUNTER — Encounter: Payer: Self-pay | Admitting: Internal Medicine

## 2012-01-19 ENCOUNTER — Ambulatory Visit (INDEPENDENT_AMBULATORY_CARE_PROVIDER_SITE_OTHER): Payer: Medicare Other | Admitting: Internal Medicine

## 2012-01-19 ENCOUNTER — Telehealth: Payer: Self-pay | Admitting: Internal Medicine

## 2012-01-19 VITALS — BP 130/70 | HR 76 | Temp 97.6°F | Resp 16 | Wt 157.0 lb

## 2012-01-19 DIAGNOSIS — D649 Anemia, unspecified: Secondary | ICD-10-CM | POA: Diagnosis not present

## 2012-01-19 DIAGNOSIS — F329 Major depressive disorder, single episode, unspecified: Secondary | ICD-10-CM

## 2012-01-19 DIAGNOSIS — M19019 Primary osteoarthritis, unspecified shoulder: Secondary | ICD-10-CM

## 2012-01-19 DIAGNOSIS — IMO0001 Reserved for inherently not codable concepts without codable children: Secondary | ICD-10-CM

## 2012-01-19 DIAGNOSIS — E538 Deficiency of other specified B group vitamins: Secondary | ICD-10-CM | POA: Diagnosis not present

## 2012-01-19 DIAGNOSIS — M19012 Primary osteoarthritis, left shoulder: Secondary | ICD-10-CM

## 2012-01-19 DIAGNOSIS — E559 Vitamin D deficiency, unspecified: Secondary | ICD-10-CM | POA: Diagnosis not present

## 2012-01-19 LAB — BASIC METABOLIC PANEL
CO2: 30 mEq/L (ref 19–32)
Chloride: 105 mEq/L (ref 96–112)
Glucose, Bld: 103 mg/dL — ABNORMAL HIGH (ref 70–99)
Potassium: 4.7 mEq/L (ref 3.5–5.1)
Sodium: 140 mEq/L (ref 135–145)

## 2012-01-19 LAB — CBC WITH DIFFERENTIAL/PLATELET
Basophils Absolute: 0 10*3/uL (ref 0.0–0.1)
Basophils Relative: 0.6 % (ref 0.0–3.0)
Eosinophils Absolute: 0.1 10*3/uL (ref 0.0–0.7)
HCT: 36.9 % (ref 36.0–46.0)
Hemoglobin: 11.8 g/dL — ABNORMAL LOW (ref 12.0–15.0)
Lymphs Abs: 1.2 10*3/uL (ref 0.7–4.0)
MCHC: 32.1 g/dL (ref 30.0–36.0)
Neutro Abs: 2.1 10*3/uL (ref 1.4–7.7)
RBC: 3.83 Mil/uL — ABNORMAL LOW (ref 3.87–5.11)
RDW: 13 % (ref 11.5–14.6)

## 2012-01-19 MED ORDER — CYANOCOBALAMIN 1000 MCG/ML IJ SOLN
1000.0000 ug | Freq: Once | INTRAMUSCULAR | Status: AC
  Administered 2012-01-19: 1000 ug via INTRAMUSCULAR

## 2012-01-19 MED ORDER — ESOMEPRAZOLE MAGNESIUM 40 MG PO CPDR: 40.0000 mg | DELAYED_RELEASE_CAPSULE | Freq: Two times a day (BID) | ORAL | Status: DC

## 2012-01-19 MED ORDER — TRIAZOLAM 0.25 MG PO TABS: 0.2500 mg | ORAL_TABLET | Freq: Every evening | ORAL | Status: DC | PRN

## 2012-01-19 MED ORDER — PREGABALIN 25 MG PO CAPS: 25.0000 mg | ORAL_CAPSULE | Freq: Three times a day (TID) | ORAL | Status: DC

## 2012-01-19 MED ORDER — OXYCODONE HCL 10 MG PO TB12: 10.0000 mg | ORAL_TABLET | Freq: Two times a day (BID) | ORAL | Status: DC | PRN

## 2012-01-19 NOTE — Assessment & Plan Note (Addendum)
C/o pain in L arm - neuropathic We can try a low dose Lyrica

## 2012-01-19 NOTE — Assessment & Plan Note (Signed)
Continue with current prescription therapy as reflected on the Med list.  

## 2012-01-19 NOTE — Telephone Encounter (Signed)
Pharmacy needs clarification on the quantity of Nexium for this patient, directions are for the patient to take the medication 2x daily and we only filled for 30 tablets, should this have been fore 60 tablets?

## 2012-01-19 NOTE — Assessment & Plan Note (Signed)
Re-start B12 

## 2012-01-19 NOTE — Telephone Encounter (Signed)
Diane Abbott, please, inform patient that all labs are normal except for a very mild anemia Thx

## 2012-01-19 NOTE — Telephone Encounter (Signed)
Please advise on sig 

## 2012-01-19 NOTE — Telephone Encounter (Signed)
Sorry, - OK #[id number] Thx

## 2012-01-19 NOTE — Progress Notes (Signed)
Patient ID: Diane Abbott, female   DOB: June 29, 1934, 77 y.o.   MRN: [id number]   Subjective:    Patient ID: Diane Abbott, female    DOB: 1934/10/14, 77 y.o.   MRN: [id number]  HPI  S/p L shoulder replacement 12/07/11 by Dr Dion Saucier. C/o pail in L arm - like hot water...   The patient is here to follow up on chronic depression, anxiety, headaches and chronic moderate fibromyalgia symptoms controlled with medicines, stable. She has been using Lovenox irregular due to cost... Xarelto is less $$ - we switched her to Xarelto a few months ago.  Wt Readings from Last 3 Encounters:  01/19/12 157 lb (71.215 kg)  12/01/11 159 lb 4.8 oz (72.258 kg)  11/17/11 161 lb (73.029 kg)   BP Readings from Last 3 Encounters:  01/19/12 130/70  12/08/11 110/41  12/08/11 110/41   Past Medical History  Diagnosis Date  . Fibromyalgia   . Pulmonary embolism   . RA (rheumatoid arthritis)   . Vitamin B 12 deficiency   . Vitamin d deficiency   . Osteoarthritis   . Osteopenia   . Adrenal insufficiency   . IBS (irritable bowel syndrome)   . History of blood clots 2008    below knee  . Diabetes mellitus type II     pt denies this 03-05-11  . Esophagitis   . Depression   . GERD (gastroesophageal reflux disease)   . Connective tissue disorder   . Cataracts, bilateral     more in left than right  . Osteoarthritis of left shoulder 12/07/2011   Past Surgical History  Procedure Date  . Abdominal hysterectomy   . Total knee arthroplasty     bilateral  . Cholecystectomy 2003  . Tonsillectomy 1941  . Appendectomy 1995  . Wrist surgery     bilateral  . Bilateral oophorectomy   . Tummy tuck   . Cosmetic surgery   . Total shoulder arthroplasty 12/07/2011    Procedure: TOTAL SHOULDER ARTHROPLASTY;  Surgeon: Eulas Post, MD;  Location: [facility] OR;  Service: Orthopedics;  Laterality: Left;  left total shoulder artthroplasty    reports that she has never smoked. She has never used smokeless tobacco. She reports  that she drinks alcohol. She reports that she does not use illicit drugs. family history includes Breast cancer in an unspecified family member; COPD in her father; Clotting disorder in an unspecified family member; Diabetes in her mother; Heart disease in her maternal grandmother; Ovarian cancer in her mother; and Pancreatic cancer in an unspecified family member.  There is no history of Colon cancer. Allergies  Allergen Reactions  . Latex Itching and Dermatitis    When examined with latex gloves, burns and itches in contact areas per pt.  . Alprazolam   . Benzodiazepines   . Clonazepam   . Codeine   . Darvocet (Propoxyphene-Acetaminophen)   . Diclofenac Sodium   . Duloxetine     REACTION: achy legs  . Gabapentin   . Morphine And Related   . Nortriptyline Hcl     REACTION: HA  . Penicillins     REACTION: Can take Cephalosporins  . Pramipexole Dihydrochloride     REACTION: sick, falling  . Prednisone   . Rofecoxib   . Sulfonamide Derivatives   . Venlafaxine    Current Outpatient Prescriptions on File Prior to Visit  Medication Sig Dispense Refill  . albuterol-ipratropium (COMBIVENT) 18-103 MCG/ACT inhaler Inhale 2 puffs into the lungs every 6 (six)  hours as needed. For shortness of breath      . esomeprazole (NEXIUM) 40 MG capsule Take 40 mg by mouth 2 (two) times daily.        Marland Kitchen estradiol (VAGIFEM) 25 MCG vaginal tablet Place 25 mcg vaginally every other day.       . fluconazole (DIFLUCAN) 200 MG tablet Take 1 tablet (200 mg total) by mouth daily.  30 tablet  1  . hydroxychloroquine (PLAQUENIL) 200 MG tablet Take 200 mg by mouth daily.      . hyoscyamine (LEVSIN SL) 0.125 MG SL tablet Place 1-2 tablets (0.125-0.25 mg total) under the tongue 4 (four) times daily as needed.  60 tablet  3  . lidocaine (LIDODERM) 5 % Place 3 patches onto the skin daily. Remove & Discard patch within 12 hours or as directed by MD      . oxyCODONE (OXYCONTIN) 10 MG 12 hr tablet Take 1 tablet (10 mg  total) by mouth every 12 (twelve) hours as needed for pain. Please fill on 12/18/11  60 tablet  0  . Rivaroxaban (XARELTO) 20 MG TABS Take 20 mg by mouth daily.      Marland Kitchen rOPINIRole (REQUIP) 2 MG tablet Take 0.5 tablets (1 mg total) by mouth 3 (three) times daily.  90 tablet  5  . triazolam (HALCION) 0.25 MG tablet Take 1 tablet (0.25 mg total) by mouth at bedtime as needed.  30 tablet  5      Review of Systems  Constitutional: Negative for activity change, appetite change, fatigue and unexpected weight change.  HENT: Positive for sinus pressure. Negative for congestion and mouth sores.   Eyes: Negative for visual disturbance.  Respiratory: Negative for chest tightness.   Gastrointestinal: Negative for nausea and abdominal pain.  Genitourinary: Negative for frequency, difficulty urinating and vaginal pain.  Musculoskeletal: Positive for back pain and arthralgias. Negative for gait problem.  Skin: Negative for pallor.  Neurological: Negative for dizziness, tremors, weakness and numbness.  Psychiatric/Behavioral: Negative for confusion and disturbed wake/sleep cycle.   BP 130/70  Pulse 76  Temp 97.6 F (36.4 C) (Oral)  Resp 16  Wt 157 lb (71.215 kg)     Objective:   Physical Exam  Constitutional: She appears well-developed and well-nourished. No distress.  HENT:  Head: Normocephalic.  Right Ear: External ear normal.  Left Ear: External ear normal.  Nose: Nose normal.  Mouth/Throat: Oropharynx is clear and moist.  Eyes: Conjunctivae normal are normal. Pupils are equal, round, and reactive to light. Right eye exhibits no discharge. Left eye exhibits no discharge.  Neck: Normal range of motion. Neck supple. No JVD present. No tracheal deviation present. No thyromegaly present.  Cardiovascular: Normal rate, regular rhythm and normal heart sounds.   Pulmonary/Chest: No stridor. No respiratory distress. She has no wheezes.  Abdominal: Soft. Bowel sounds are normal. She exhibits no  distension and no mass. There is no tenderness. There is no rebound and no guarding.  Musculoskeletal: She exhibits tenderness. She exhibits no edema.       L shoulder is tender w/ROM  Lymphadenopathy:    She has no cervical adenopathy.  Neurological: She displays normal reflexes. No cranial nerve deficit. She exhibits normal muscle tone. Coordination normal.  Skin: No rash noted. No erythema.  Psychiatric: She has a normal mood and affect. Her behavior is normal. Judgment and thought content normal.       sad   Lab Results  Component Value Date   WBC 4.2 12/08/2011  HGB 9.9* 12/08/2011   HCT 30.1* 12/08/2011   PLT 139* 12/08/2011   GLUCOSE 127* 12/08/2011   ALT 11 12/01/2011   AST 15 12/01/2011   NA 138 12/08/2011   K 4.2 12/08/2011   CL 104 12/08/2011   CREATININE 1.01 12/08/2011   BUN 13 12/08/2011   CO2 28 12/08/2011   TSH 2.88 01/09/2011   INR 0.90* 02/17/2011           Assessment & Plan:

## 2012-01-20 ENCOUNTER — Telehealth: Payer: Self-pay | Admitting: *Deleted

## 2012-01-20 NOTE — Telephone Encounter (Signed)
Pharmacy informed. Left detailed mess informing pt of below.

## 2012-01-20 NOTE — Telephone Encounter (Signed)
Caller reports that [employer] has made several attempts to reach patient via telephone RE: delivery of her medication and have not been able to reach. Called Son-Bob Demedeiros listed in [facility] at [phone] who reports that his mom [patient] only has mobile and will not answer phone, so he will take message to pt with toll free number to call about receiving her medication/SLS

## 2012-01-26 ENCOUNTER — Telehealth: Payer: Self-pay

## 2012-01-26 NOTE — Telephone Encounter (Signed)
FYI.Marland KitchenMarland KitchenMarland KitchenSelena Batten (PT) w/ [facility] called stating that their computer system shows a potential adverse reaction between Lovenox and Alieve. It states increased potential for bleeding with a NSAID and Heprin. Thanks

## 2012-01-26 NOTE — Telephone Encounter (Signed)
Ok to d/c [employer]

## 2012-01-27 NOTE — Telephone Encounter (Signed)
Tylenol prn Thx

## 2012-01-27 NOTE — Telephone Encounter (Signed)
[name] informed of below. What should pt be taking OTC for pain?

## 2012-01-28 ENCOUNTER — Telehealth: Payer: Self-pay | Admitting: *Deleted

## 2012-01-28 NOTE — Telephone Encounter (Signed)
[facility] OT [name] called back stating that she had informed pt per yesterday's phone note to d/c Aleve and take Tylenol because of drug interaction with Lovenox. Pt states that Dr. Juanda Chance told her that it was okay to take Aleve and Lovenox so pt will continue to take. [facility] wanted to make you aware.

## 2012-01-28 NOTE — Telephone Encounter (Signed)
[name] informed

## 2012-01-29 NOTE — Telephone Encounter (Signed)
Noted. Thx.

## 2012-02-07 DIAGNOSIS — Z471 Aftercare following joint replacement surgery: Secondary | ICD-10-CM | POA: Diagnosis not present

## 2012-02-07 DIAGNOSIS — M069 Rheumatoid arthritis, unspecified: Secondary | ICD-10-CM | POA: Diagnosis not present

## 2012-02-07 DIAGNOSIS — Z96619 Presence of unspecified artificial shoulder joint: Secondary | ICD-10-CM | POA: Diagnosis not present

## 2012-02-07 DIAGNOSIS — IMO0001 Reserved for inherently not codable concepts without codable children: Secondary | ICD-10-CM | POA: Diagnosis not present

## 2012-02-07 DIAGNOSIS — J449 Chronic obstructive pulmonary disease, unspecified: Secondary | ICD-10-CM | POA: Diagnosis not present

## 2012-02-08 DIAGNOSIS — M069 Rheumatoid arthritis, unspecified: Secondary | ICD-10-CM | POA: Diagnosis not present

## 2012-02-08 DIAGNOSIS — J449 Chronic obstructive pulmonary disease, unspecified: Secondary | ICD-10-CM | POA: Diagnosis not present

## 2012-02-08 DIAGNOSIS — Z471 Aftercare following joint replacement surgery: Secondary | ICD-10-CM | POA: Diagnosis not present

## 2012-02-08 DIAGNOSIS — Z96619 Presence of unspecified artificial shoulder joint: Secondary | ICD-10-CM | POA: Diagnosis not present

## 2012-02-08 DIAGNOSIS — IMO0001 Reserved for inherently not codable concepts without codable children: Secondary | ICD-10-CM | POA: Diagnosis not present

## 2012-02-09 ENCOUNTER — Telehealth: Payer: Self-pay | Admitting: Internal Medicine

## 2012-02-09 MED ORDER — FLUCONAZOLE 150 MG PO TABS: 150.0000 mg | ORAL_TABLET | Freq: Once | ORAL | Status: DC

## 2012-02-09 MED ORDER — AZITHROMYCIN 250 MG PO TABS: ORAL_TABLET | ORAL | Status: DC

## 2012-02-09 NOTE — Telephone Encounter (Signed)
Done. Thx.

## 2012-02-09 NOTE — Telephone Encounter (Signed)
Caller: Pallie/Patient; Patient Name: Diane Abbott; PCP: Sonda Primes; Best Callback Phone Number: [phone] Onset- 02/08/12 has not taken temp. and can not find thermometer at this time. Pt has hx of COPD and Bronchitis. She has a non-productive cough and taste in her mouth that she gets when she is getting Bronchitis.  She is calling to request an antibiotic be called in and Diflucan because antibiotic will give her thrush. Emergent s/s of Cough protocol r/o.  Pt to see provider within 4hrs. No appointments seen in [facility] for this office.  Message sent  for appointment. Pt states pharmacy is [facility] in [place] if physician will send in prescriptions. Pt aware of policy concerning antibiotics.

## 2012-02-10 NOTE — Telephone Encounter (Signed)
Left detailed mess informing pt.  

## 2012-02-12 DIAGNOSIS — Z471 Aftercare following joint replacement surgery: Secondary | ICD-10-CM | POA: Diagnosis not present

## 2012-02-12 DIAGNOSIS — IMO0001 Reserved for inherently not codable concepts without codable children: Secondary | ICD-10-CM | POA: Diagnosis not present

## 2012-02-12 DIAGNOSIS — M069 Rheumatoid arthritis, unspecified: Secondary | ICD-10-CM | POA: Diagnosis not present

## 2012-02-12 DIAGNOSIS — J449 Chronic obstructive pulmonary disease, unspecified: Secondary | ICD-10-CM | POA: Diagnosis not present

## 2012-02-12 DIAGNOSIS — Z96619 Presence of unspecified artificial shoulder joint: Secondary | ICD-10-CM | POA: Diagnosis not present

## 2012-02-15 ENCOUNTER — Other Ambulatory Visit: Payer: Self-pay | Admitting: *Deleted

## 2012-02-15 DIAGNOSIS — Z96619 Presence of unspecified artificial shoulder joint: Secondary | ICD-10-CM | POA: Diagnosis not present

## 2012-02-15 DIAGNOSIS — Z471 Aftercare following joint replacement surgery: Secondary | ICD-10-CM | POA: Diagnosis not present

## 2012-02-15 DIAGNOSIS — J449 Chronic obstructive pulmonary disease, unspecified: Secondary | ICD-10-CM | POA: Diagnosis not present

## 2012-02-15 DIAGNOSIS — IMO0001 Reserved for inherently not codable concepts without codable children: Secondary | ICD-10-CM | POA: Diagnosis not present

## 2012-02-15 DIAGNOSIS — M069 Rheumatoid arthritis, unspecified: Secondary | ICD-10-CM | POA: Diagnosis not present

## 2012-02-16 ENCOUNTER — Other Ambulatory Visit: Payer: Self-pay | Admitting: *Deleted

## 2012-02-16 DIAGNOSIS — H2510 Age-related nuclear cataract, unspecified eye: Secondary | ICD-10-CM | POA: Diagnosis not present

## 2012-02-16 DIAGNOSIS — E538 Deficiency of other specified B group vitamins: Secondary | ICD-10-CM

## 2012-02-18 ENCOUNTER — Telehealth: Payer: Self-pay | Admitting: *Deleted

## 2012-02-18 ENCOUNTER — Other Ambulatory Visit (HOSPITAL_BASED_OUTPATIENT_CLINIC_OR_DEPARTMENT_OTHER): Payer: Medicare Other | Admitting: Lab

## 2012-02-18 ENCOUNTER — Ambulatory Visit (HOSPITAL_BASED_OUTPATIENT_CLINIC_OR_DEPARTMENT_OTHER): Payer: Medicare Other | Admitting: Oncology

## 2012-02-18 VITALS — BP 108/65 | HR 99 | Temp 98.2°F | Resp 20 | Ht 62.0 in | Wt 159.1 lb

## 2012-02-18 DIAGNOSIS — Z7901 Long term (current) use of anticoagulants: Secondary | ICD-10-CM

## 2012-02-18 DIAGNOSIS — E538 Deficiency of other specified B group vitamins: Secondary | ICD-10-CM

## 2012-02-18 DIAGNOSIS — D649 Anemia, unspecified: Secondary | ICD-10-CM | POA: Diagnosis not present

## 2012-02-18 DIAGNOSIS — C50919 Malignant neoplasm of unspecified site of unspecified female breast: Secondary | ICD-10-CM | POA: Diagnosis not present

## 2012-02-18 DIAGNOSIS — D6859 Other primary thrombophilia: Secondary | ICD-10-CM

## 2012-02-18 LAB — CBC & DIFF AND RETIC
BASO%: 1.8 % (ref 0.0–2.0)
Eosinophils Absolute: 0.1 10*3/uL (ref 0.0–0.5)
Immature Retic Fract: 7.5 % (ref 1.60–10.00)
LYMPH%: 39.4 % (ref 14.0–49.7)
MCHC: 32.8 g/dL (ref 31.5–36.0)
MONO#: 0.2 10*3/uL (ref 0.1–0.9)
NEUT#: 1.6 10*3/uL (ref 1.5–6.5)
Platelets: 200 10*3/uL (ref 145–400)
RBC: 4.01 10*6/uL (ref 3.70–5.45)
Retic %: 1.06 % (ref 0.70–2.10)
Retic Ct Abs: 42.51 10*3/uL (ref 33.70–90.70)
WBC: 3.3 10*3/uL — ABNORMAL LOW (ref 3.9–10.3)
lymph#: 1.3 10*3/uL (ref 0.9–3.3)
nRBC: 0 % (ref 0–0)

## 2012-02-18 LAB — COMPREHENSIVE METABOLIC PANEL (CC13)
ALT: 15 U/L (ref 0–55)
AST: 17 U/L (ref 5–34)
Alkaline Phosphatase: 75 U/L (ref 40–150)
CO2: 27 mEq/L (ref 22–29)
Creatinine: 1.3 mg/dL — ABNORMAL HIGH (ref 0.6–1.1)
Sodium: 144 mEq/L (ref 136–145)
Total Bilirubin: 0.3 mg/dL (ref 0.20–1.20)
Total Protein: 6.9 g/dL (ref 6.4–8.3)

## 2012-02-18 LAB — CHCC SMEAR

## 2012-02-18 LAB — MORPHOLOGY

## 2012-02-18 NOTE — Progress Notes (Signed)
Hematology and Oncology Follow Up Visit  Diane Abbott [id number] 28-Aug-1934 77 y.o. 02/18/2012 10:22 AM PCP Dr Posey Rea Principle Diagnosis: History of hypercoagulable state with positive family history on chronic Lovenox therapy History of anemia  Interim History:  There have been no intercurrent illness, hospitalizations or medication changes. [name] is not compliant with her Lovenox because of financial issues. Her family doctor is given her samples of a oral anticoagulant likely a antithrombin inhibitor. To try. From a financial point of view this may be more expedient. She a mammogram a few months ago and a followup is recommended for this month. This has not been booked to date. There is a hypoechoic area seen on her mammogram and ultrasound.  Medications: I have reviewed the patient's current medications.  Allergies:  Allergies  Allergen Reactions  . Latex Itching and Dermatitis    When examined with latex gloves, burns and itches in contact areas per pt.  . Alprazolam   . Benzodiazepines   . Clonazepam   . Codeine   . Darvocet (Propoxyphene-Acetaminophen)   . Diclofenac Sodium   . Duloxetine     REACTION: achy legs  . Gabapentin     HA  . Morphine And Related   . Nortriptyline Hcl     REACTION: HA  . Penicillins     REACTION: Can take Cephalosporins  . Pramipexole Dihydrochloride     REACTION: sick, falling  . Prednisone   . Rofecoxib   . Sulfonamide Derivatives   . Venlafaxine     Past Medical History, Surgical history, Social history, and Family History were reviewed and updated.  Review of Systems: Constitutional:  Negative for fever, chills, night sweats, anorexia, weight loss, pain. Cardiovascular: negative Respiratory: negative Neurological: negative Dermatological: negative ENT: negative Skin Gastrointestinal: negative Genito-Urinary: negative Hematological and Lymphatic: negative Breast: negative Musculoskeletal: negative Remaining ROS  negative.  Physical Exam: Blood pressure 108/65, pulse 99, temperature 98.2 F (36.8 C), resp. rate 20, height 5\' 2"  (1.575 m), weight 159 lb 1.6 oz (72.167 kg). ECOG: 0 General appearance: alert, cooperative and appears stated age Head: Normocephalic, without obvious abnormality, atraumatic Neck: no adenopathy, no carotid bruit, no JVD, supple, symmetrical, trachea midline and thyroid not enlarged, symmetric, no tenderness/mass/nodules Lymph nodes: Cervical, supraclavicular, and axillary nodes normal. Cardiac : regular rate and rhythm, no murmurs or gallops Pulmonary:clear to auscultation bilaterally and normal percussion bilaterally Breasts: inspection negative, no nipple discharge or bleeding, no masses or nodularity palpable Abdomen:soft, non-tender; bowel sounds normal; no masses,  no organomegaly Extremities negative Neuro: alert, oriented, normal speech, no focal findings or movement disorder noted  Lab Results: Lab Results  Component Value Date   WBC 3.3* 02/18/2012   HGB 12.2 02/18/2012   HCT 37.2 02/18/2012   MCV 92.8 02/18/2012   PLT 200 02/18/2012     Chemistry      Component Value Date/Time   NA 140 01/19/2012 1405   K 4.7 01/19/2012 1405   CL 105 01/19/2012 1405   CO2 30 01/19/2012 1405   BUN 12 01/19/2012 1405   CREATININE 1.0 01/19/2012 1405      Component Value Date/Time   CALCIUM 8.9 01/19/2012 1405   ALKPHOS 69 12/01/2011 1148   AST 15 12/01/2011 1148   ALT 11 12/01/2011 1148   BILITOT 0.4 12/01/2011 1148      .pathology. Radiological Studies: chest X-ray n/a Mammogram Due 4/13 Bone density n/a  Impression and Plan: I did discuss with him within the possibility of her taking rivaroxaban,  this is likely a good idea especially if this is something that she can obtain on an ongoing basis. I've scheduled her followup mammogram. I will see her in 6 months.   More than 50% of the visit was spent in patient-related counselling   Pierce Crane,  MD 10/17/201310:22 AM

## 2012-02-18 NOTE — Telephone Encounter (Signed)
GAVE PATIENT APPOINTMENT FOR LAB AND MD ON 09-13-2012 STARTING AT 8:30AM

## 2012-02-18 NOTE — Progress Notes (Signed)
Hematology and Oncology Follow Up Visit  Diane Abbott [id number] 01-27-1935 77 y.o. 02/18/2012 10:26 AM PCP Dr Posey Rea Principle Diagnosis: History of hypercoagulable state with positive family history on chronic Lovenox therapy History of anemia  Interim History:  There have been no intercurrent illness, hospitalizations or medication changes. She had shoulder surgery 8/13 and is recovering.she was on xarelto and is receiving lovenox free for 1 month. If she doesn't get free drug, she will go back to xarelto. She has some fatigue.no other complaints.. She has had bronchitis x 3.. She has not had f/u mammogram. Medications: I have reviewed the patient's current medications.  Allergies:  Allergies  Allergen Reactions  . Latex Itching and Dermatitis    When examined with latex gloves, burns and itches in contact areas per pt.  . Alprazolam   . Benzodiazepines   . Clonazepam   . Codeine   . Darvocet (Propoxyphene-Acetaminophen)   . Diclofenac Sodium   . Duloxetine     REACTION: achy legs  . Gabapentin     HA  . Morphine And Related   . Nortriptyline Hcl     REACTION: HA  . Penicillins     REACTION: Can take Cephalosporins  . Pramipexole Dihydrochloride     REACTION: sick, falling  . Prednisone   . Rofecoxib   . Sulfonamide Derivatives   . Venlafaxine     Past Medical History, Surgical history, Social history, and Family History were reviewed and updated.  Review of Systems: Constitutional:  Negative for fever, chills, night sweats, anorexia, weight loss, pain. Cardiovascular: negative Respiratory: negative Neurological: negative Dermatological: negative ENT: negative Skin Gastrointestinal: negative Genito-Urinary: negative Hematological and Lymphatic: negative Breast: negative Musculoskeletal: negative Remaining ROS negative.  Physical Exam: Blood pressure 108/65, pulse 99, temperature 98.2 F (36.8 C), resp. rate 20, height 5\' 2"  (1.575 m), weight 159 lb  1.6 oz (72.167 kg). ECOG: 0 General appearance: alert, cooperative and appears stated age Head: Normocephalic, without obvious abnormality, atraumatic Neck: no adenopathy, no carotid bruit, no JVD, supple, symmetrical, trachea midline and thyroid not enlarged, symmetric, no tenderness/mass/nodules Lymph nodes: Cervical, supraclavicular, and axillary nodes normal. Cardiac : regular rate and rhythm, no murmurs or gallops Pulmonary:clear to auscultation bilaterally and normal percussion bilaterally Breasts: inspection negative, no nipple discharge or bleeding, no masses or nodularity palpable, I can't feel anything in her breast. Abdomen:soft, non-tender; bowel sounds normal; no masses,  no organomegaly Extremities negative Neuro: alert, oriented, normal speech, no focal findings or movement disorder noted  Lab Results: Lab Results  Component Value Date   WBC 3.3* 02/18/2012   HGB 12.2 02/18/2012   HCT 37.2 02/18/2012   MCV 92.8 02/18/2012   PLT 200 02/18/2012     Chemistry      Component Value Date/Time   NA 140 01/19/2012 1405   K 4.7 01/19/2012 1405   CL 105 01/19/2012 1405   CO2 30 01/19/2012 1405   BUN 12 01/19/2012 1405   CREATININE 1.0 01/19/2012 1405      Component Value Date/Time   CALCIUM 8.9 01/19/2012 1405   ALKPHOS 69 12/01/2011 1148   AST 15 12/01/2011 1148   ALT 11 12/01/2011 1148   BILITOT 0.4 12/01/2011 1148      .pathology. Radiological Studies: chest X-ray n/a Mammogram  Bone density n/a  Impression and Plan: History of hypercoagulable state. She has now been on Lovenox. She may continue Lovenox based on availability and/or switch back to xarelto. I will plan to see her back in  about 6 months time. I've encouraged once again to get her mammogram as followup from previous mammograms she should have about 4 months ago.   More than 50% of the visit was spent in patient-related counselling   Pierce Crane, MD 10/17/201310:26 AM

## 2012-02-19 DIAGNOSIS — M069 Rheumatoid arthritis, unspecified: Secondary | ICD-10-CM | POA: Diagnosis not present

## 2012-02-19 DIAGNOSIS — Z471 Aftercare following joint replacement surgery: Secondary | ICD-10-CM | POA: Diagnosis not present

## 2012-02-19 DIAGNOSIS — J449 Chronic obstructive pulmonary disease, unspecified: Secondary | ICD-10-CM | POA: Diagnosis not present

## 2012-02-19 DIAGNOSIS — Z96619 Presence of unspecified artificial shoulder joint: Secondary | ICD-10-CM | POA: Diagnosis not present

## 2012-02-19 DIAGNOSIS — IMO0001 Reserved for inherently not codable concepts without codable children: Secondary | ICD-10-CM | POA: Diagnosis not present

## 2012-02-24 DIAGNOSIS — Z471 Aftercare following joint replacement surgery: Secondary | ICD-10-CM | POA: Diagnosis not present

## 2012-02-24 DIAGNOSIS — J449 Chronic obstructive pulmonary disease, unspecified: Secondary | ICD-10-CM | POA: Diagnosis not present

## 2012-02-24 DIAGNOSIS — M069 Rheumatoid arthritis, unspecified: Secondary | ICD-10-CM | POA: Diagnosis not present

## 2012-02-24 DIAGNOSIS — IMO0001 Reserved for inherently not codable concepts without codable children: Secondary | ICD-10-CM | POA: Diagnosis not present

## 2012-02-24 DIAGNOSIS — Z96619 Presence of unspecified artificial shoulder joint: Secondary | ICD-10-CM | POA: Diagnosis not present

## 2012-02-26 ENCOUNTER — Other Ambulatory Visit: Payer: Self-pay | Admitting: *Deleted

## 2012-02-26 ENCOUNTER — Telehealth: Payer: Self-pay | Admitting: Internal Medicine

## 2012-02-26 MED ORDER — ROPINIROLE HCL 2 MG PO TABS: 2.0000 mg | ORAL_TABLET | Freq: Three times a day (TID) | ORAL | Status: DC

## 2012-02-26 NOTE — Telephone Encounter (Signed)
Caller: Dorthory/Patient; Patient Name: Diane Abbott; PCP: Plotnikov, Alex (Adults only); Best Callback Phone Number: [phone].  Call regarding Requip RX.  Pt states, my Requip RX in Sept was written wrong: 2mg , take 1.5 tabs BID #90, instead of the correct RX 2mg , take 1 tab by mouth 3 x daily.  Per [facility], confirmed RX written on 10-25 w/ Pt:  Requip 2mg , take 1 tab by mouth 3 times daily #90, 5 refills.  Pt confirms RX is correct.  Pt would like to make sure RX is not changed again for future reference due to it is hard for Pt to transport w/ current physical therapy.

## 2012-03-02 DIAGNOSIS — J449 Chronic obstructive pulmonary disease, unspecified: Secondary | ICD-10-CM | POA: Diagnosis not present

## 2012-03-02 DIAGNOSIS — Z471 Aftercare following joint replacement surgery: Secondary | ICD-10-CM | POA: Diagnosis not present

## 2012-03-02 DIAGNOSIS — M069 Rheumatoid arthritis, unspecified: Secondary | ICD-10-CM | POA: Diagnosis not present

## 2012-03-02 DIAGNOSIS — IMO0001 Reserved for inherently not codable concepts without codable children: Secondary | ICD-10-CM | POA: Diagnosis not present

## 2012-03-02 DIAGNOSIS — Z96619 Presence of unspecified artificial shoulder joint: Secondary | ICD-10-CM | POA: Diagnosis not present

## 2012-03-21 ENCOUNTER — Encounter: Payer: Self-pay | Admitting: Internal Medicine

## 2012-03-21 ENCOUNTER — Ambulatory Visit (INDEPENDENT_AMBULATORY_CARE_PROVIDER_SITE_OTHER): Payer: Medicare Other | Admitting: Internal Medicine

## 2012-03-21 ENCOUNTER — Other Ambulatory Visit: Payer: Self-pay | Admitting: *Deleted

## 2012-03-21 VITALS — BP 140/60 | HR 72 | Temp 97.5°F | Resp 16 | Wt 162.0 lb

## 2012-03-21 DIAGNOSIS — M359 Systemic involvement of connective tissue, unspecified: Secondary | ICD-10-CM

## 2012-03-21 DIAGNOSIS — Z23 Encounter for immunization: Secondary | ICD-10-CM | POA: Diagnosis not present

## 2012-03-21 DIAGNOSIS — E538 Deficiency of other specified B group vitamins: Secondary | ICD-10-CM

## 2012-03-21 DIAGNOSIS — K219 Gastro-esophageal reflux disease without esophagitis: Secondary | ICD-10-CM | POA: Diagnosis not present

## 2012-03-21 DIAGNOSIS — IMO0001 Reserved for inherently not codable concepts without codable children: Secondary | ICD-10-CM

## 2012-03-21 DIAGNOSIS — S51809A Unspecified open wound of unspecified forearm, initial encounter: Secondary | ICD-10-CM | POA: Diagnosis not present

## 2012-03-21 MED ORDER — OXYCODONE HCL 10 MG PO TB12: 10.0000 mg | ORAL_TABLET | Freq: Two times a day (BID) | ORAL | Status: DC | PRN

## 2012-03-21 MED ORDER — SILVER SULFADIAZINE 1 % EX CREA: TOPICAL_CREAM | Freq: Every day | CUTANEOUS | Status: DC

## 2012-03-21 MED ORDER — MUPIROCIN CALCIUM 2 % EX CREA: TOPICAL_CREAM | Freq: Every day | CUTANEOUS | Status: DC

## 2012-03-21 NOTE — Progress Notes (Signed)
Subjective:    Patient ID: Diane Abbott, female    DOB: 15-Jun-1934, 77 y.o.   MRN: [id number]  HPI  C/o L forearm abrasion 2 d ago S/p L shoulder replacement 12/07/11 by Dr Dion Saucier. C/o pail in L arm - like hot water...   The patient is here to follow up on chronic depression, anxiety, headaches and chronic moderate fibromyalgia symptoms controlled with medicines, stable. She has been using Lovenox irregular due to cost... Xarelto is less $$ - we switched her to Xarelto a few months ago.  Wt Readings from Last 3 Encounters:  03/21/12 162 lb (73.483 kg)  02/18/12 159 lb 1.6 oz (72.167 kg)  01/19/12 157 lb (71.215 kg)   BP Readings from Last 3 Encounters:  03/21/12 140/60  02/18/12 108/65  01/19/12 130/70   Past Medical History  Diagnosis Date  . Fibromyalgia   . Pulmonary embolism   . RA (rheumatoid arthritis)   . Vitamin B 12 deficiency   . Vitamin D deficiency   . Osteoarthritis   . Osteopenia   . Adrenal insufficiency   . IBS (irritable bowel syndrome)   . History of blood clots 2008    below knee  . Diabetes mellitus type II     pt denies this 03-05-11  . Esophagitis   . Depression   . GERD (gastroesophageal reflux disease)   . Connective tissue disorder   . Cataracts, bilateral     more in left than right  . Osteoarthritis of left shoulder 12/07/2011   Past Surgical History  Procedure Date  . Abdominal hysterectomy   . Total knee arthroplasty     bilateral  . Cholecystectomy 2003  . Tonsillectomy 1941  . Appendectomy 1995  . Wrist surgery     bilateral  . Bilateral oophorectomy   . Tummy tuck   . Cosmetic surgery   . Total shoulder arthroplasty 12/07/2011    Procedure: TOTAL SHOULDER ARTHROPLASTY;  Surgeon: Eulas Post, MD;  Location: [facility] OR;  Service: Orthopedics;  Laterality: Left;  left total shoulder artthroplasty    reports that she has never smoked. She has never used smokeless tobacco. She reports that she drinks alcohol. She reports that she  does not use illicit drugs. family history includes Breast cancer in an unspecified family member; COPD in her father; Clotting disorder in an unspecified family member; Diabetes in her mother; Heart disease in her maternal grandmother; Ovarian cancer in her mother; and Pancreatic cancer in an unspecified family member.  There is no history of Colon cancer. Allergies  Allergen Reactions  . Latex Itching and Dermatitis    When examined with latex gloves, burns and itches in contact areas per pt.  . Alprazolam   . Benzodiazepines   . Clonazepam   . Codeine   . Darvocet (Propoxyphene-Acetaminophen)   . Diclofenac Sodium   . Duloxetine     REACTION: achy legs  . Gabapentin     HA  . Morphine And Related   . Nortriptyline Hcl     REACTION: HA  . Penicillins     REACTION: Can take Cephalosporins  . Pramipexole Dihydrochloride     REACTION: sick, falling  . Prednisone   . Rofecoxib   . Sulfonamide Derivatives   . Venlafaxine    Current Outpatient Prescriptions on File Prior to Visit  Medication Sig Dispense Refill  . albuterol-ipratropium (COMBIVENT) 18-103 MCG/ACT inhaler Inhale 2 puffs into the lungs every 6 (six) hours as needed. For shortness of  breath      . azithromycin (ZITHROMAX Z-PAK) 250 MG tablet As dirrected  6 each  0  . enoxaparin (LOVENOX) 120 MG/0.8ML injection Inject 120 mg into the skin.      Marland Kitchen esomeprazole (NEXIUM) 40 MG capsule Take 1 capsule (40 mg total) by mouth 2 (two) times daily.  30 capsule  11  . estradiol (VAGIFEM) 25 MCG vaginal tablet Place 25 mcg vaginally every other day.       . fluconazole (DIFLUCAN) 150 MG tablet Take 1 tablet (150 mg total) by mouth once.  1 tablet  1  . hydroxychloroquine (PLAQUENIL) 200 MG tablet Take 200 mg by mouth daily.      . hyoscyamine (LEVSIN SL) 0.125 MG SL tablet Place 1-2 tablets (0.125-0.25 mg total) under the tongue 4 (four) times daily as needed.  60 tablet  3  . lidocaine (LIDODERM) 5 % Place 3 patches onto the skin  daily. Remove & Discard patch within 12 hours or as directed by MD      . oxyCODONE (OXYCONTIN) 10 MG 12 hr tablet Take 1 tablet (10 mg total) by mouth every 12 (twelve) hours as needed for pain. Please fill on 02/17/12  60 tablet  0  . rOPINIRole (REQUIP) 2 MG tablet Take 1 tablet (2 mg total) by mouth 3 (three) times daily.  90 tablet  5  . triazolam (HALCION) 0.25 MG tablet Take 1 tablet (0.25 mg total) by mouth at bedtime as needed.  30 tablet  5  . pregabalin (LYRICA) 25 MG capsule Take 1 capsule (25 mg total) by mouth 3 (three) times daily.  30 capsule  5      Review of Systems  Constitutional: Negative for activity change, appetite change, fatigue and unexpected weight change.  HENT: Positive for sinus pressure. Negative for congestion and mouth sores.   Eyes: Negative for visual disturbance.  Respiratory: Negative for chest tightness.   Gastrointestinal: Negative for nausea and abdominal pain.  Genitourinary: Negative for frequency, difficulty urinating and vaginal pain.  Musculoskeletal: Positive for back pain and arthralgias. Negative for gait problem.  Skin: Negative for pallor.  Neurological: Negative for dizziness, tremors, weakness and numbness.  Psychiatric/Behavioral: Negative for confusion and sleep disturbance.   BP 140/60  Pulse 72  Temp 97.5 F (36.4 C) (Oral)  Resp 16  Wt 162 lb (73.483 kg)     Objective:   Physical Exam  Constitutional: She appears well-developed and well-nourished. No distress.  HENT:  Head: Normocephalic.  Right Ear: External ear normal.  Left Ear: External ear normal.  Nose: Nose normal.  Mouth/Throat: Oropharynx is clear and moist.  Eyes: Conjunctivae normal are normal. Pupils are equal, round, and reactive to light. Right eye exhibits no discharge. Left eye exhibits no discharge.  Neck: Normal range of motion. Neck supple. No JVD present. No tracheal deviation present. No thyromegaly present.  Cardiovascular: Normal rate, regular  rhythm and normal heart sounds.   Pulmonary/Chest: No stridor. No respiratory distress. She has no wheezes.  Abdominal: Soft. Bowel sounds are normal. She exhibits no distension and no mass. There is no tenderness. There is no rebound and no guarding.  Musculoskeletal: She exhibits tenderness. She exhibits no edema.       L shoulder is tender w/ROM  Lymphadenopathy:    She has no cervical adenopathy.  Neurological: She displays normal reflexes. No cranial nerve deficit. She exhibits normal muscle tone. Coordination normal.  Skin: No rash noted. No erythema.  Psychiatric: She has  a normal mood and affect. Her behavior is normal. Judgment and thought content normal.       sad  L dorsal forearm 1.5x1.5 cm abrasion  Lab Results  Component Value Date   WBC 3.3* 02/18/2012   HGB 12.2 02/18/2012   HCT 37.2 02/18/2012   PLT 200 02/18/2012   GLUCOSE 153* 02/18/2012   ALT 15 02/18/2012   AST 17 02/18/2012   NA 144 02/18/2012   K 4.4 02/18/2012   CL 105 02/18/2012   CREATININE 1.3* 02/18/2012   BUN 20.0 02/18/2012   CO2 27 02/18/2012   TSH 2.88 01/09/2011   INR 0.90* 02/17/2011     The wound was dressed      Assessment & Plan:

## 2012-03-21 NOTE — Assessment & Plan Note (Signed)
Continue with current prescription therapy as reflected on the Med list.  

## 2012-03-21 NOTE — Assessment & Plan Note (Signed)
I dressed the wound Rx - silvadene cream

## 2012-03-28 DIAGNOSIS — M069 Rheumatoid arthritis, unspecified: Secondary | ICD-10-CM | POA: Diagnosis not present

## 2012-03-28 DIAGNOSIS — M25549 Pain in joints of unspecified hand: Secondary | ICD-10-CM | POA: Diagnosis not present

## 2012-03-28 DIAGNOSIS — M25579 Pain in unspecified ankle and joints of unspecified foot: Secondary | ICD-10-CM | POA: Diagnosis not present

## 2012-03-28 DIAGNOSIS — M25569 Pain in unspecified knee: Secondary | ICD-10-CM | POA: Diagnosis not present

## 2012-03-30 DIAGNOSIS — M542 Cervicalgia: Secondary | ICD-10-CM | POA: Diagnosis not present

## 2012-04-05 ENCOUNTER — Telehealth: Payer: Self-pay | Admitting: Internal Medicine

## 2012-04-05 DIAGNOSIS — M5412 Radiculopathy, cervical region: Secondary | ICD-10-CM

## 2012-04-05 NOTE — Telephone Encounter (Signed)
Ok Done Thx 

## 2012-04-05 NOTE — Telephone Encounter (Signed)
Caller: Sabrina/Child; Phone: [phone]; Reason for Call: Wanting referral to [facility] Surgeon: Hilda Lias [phone] on [address] for treatment of arm numbess.  X-ray done and bone spurs in neck.  Needs Injections.

## 2012-04-14 DIAGNOSIS — M5030 Other cervical disc degeneration, unspecified cervical region: Secondary | ICD-10-CM | POA: Diagnosis not present

## 2012-04-14 DIAGNOSIS — M542 Cervicalgia: Secondary | ICD-10-CM | POA: Diagnosis not present

## 2012-04-14 DIAGNOSIS — M5412 Radiculopathy, cervical region: Secondary | ICD-10-CM | POA: Diagnosis not present

## 2012-04-25 DIAGNOSIS — G5620 Lesion of ulnar nerve, unspecified upper limb: Secondary | ICD-10-CM | POA: Diagnosis not present

## 2012-04-25 DIAGNOSIS — G5600 Carpal tunnel syndrome, unspecified upper limb: Secondary | ICD-10-CM | POA: Diagnosis not present

## 2012-05-25 ENCOUNTER — Encounter: Payer: Self-pay | Admitting: Internal Medicine

## 2012-05-25 ENCOUNTER — Ambulatory Visit (INDEPENDENT_AMBULATORY_CARE_PROVIDER_SITE_OTHER): Payer: Medicare Other | Admitting: Internal Medicine

## 2012-05-25 VITALS — BP 150/90 | HR 76 | Temp 98.1°F | Resp 16 | Wt 160.0 lb

## 2012-05-25 DIAGNOSIS — S51809A Unspecified open wound of unspecified forearm, initial encounter: Secondary | ICD-10-CM

## 2012-05-25 DIAGNOSIS — E559 Vitamin D deficiency, unspecified: Secondary | ICD-10-CM | POA: Diagnosis not present

## 2012-05-25 DIAGNOSIS — F329 Major depressive disorder, single episode, unspecified: Secondary | ICD-10-CM

## 2012-05-25 DIAGNOSIS — F3289 Other specified depressive episodes: Secondary | ICD-10-CM

## 2012-05-25 DIAGNOSIS — I82409 Acute embolism and thrombosis of unspecified deep veins of unspecified lower extremity: Secondary | ICD-10-CM

## 2012-05-25 DIAGNOSIS — M069 Rheumatoid arthritis, unspecified: Secondary | ICD-10-CM | POA: Diagnosis not present

## 2012-05-25 DIAGNOSIS — R609 Edema, unspecified: Secondary | ICD-10-CM

## 2012-05-25 DIAGNOSIS — E538 Deficiency of other specified B group vitamins: Secondary | ICD-10-CM

## 2012-05-25 MED ORDER — OXYCODONE HCL 10 MG PO TB12: 10.0000 mg | ORAL_TABLET | Freq: Two times a day (BID) | ORAL | Status: DC | PRN

## 2012-05-25 MED ORDER — MECLIZINE HCL 12.5 MG PO TABS: 12.5000 mg | ORAL_TABLET | Freq: Three times a day (TID) | ORAL | Status: DC | PRN

## 2012-05-25 MED ORDER — RIVAROXABAN 20 MG PO TABS: 20.0000 mg | ORAL_TABLET | Freq: Every day | ORAL | Status: DC

## 2012-05-25 NOTE — Assessment & Plan Note (Signed)
Continue with current prescription therapy as reflected on the Med list.  

## 2012-05-25 NOTE — Assessment & Plan Note (Signed)
Not on any systemic Rx at this time

## 2012-05-25 NOTE — Progress Notes (Signed)
Subjective:     HPI   S/p L shoulder replacement 12/07/11 by Dr Dion Saucier. F/u pain in L arm - better   The patient is here to follow up on chronic depression, anxiety, headaches and chronic moderate fibromyalgia symptoms controlled with medicines, stable. She has been using Lovenox irregular due to cost... Xarelto - we switched her to it due to Lovenox cost a few months ago. Xarelto is $400/mo...  Wt Readings from Last 3 Encounters:  05/25/12 160 lb (72.576 kg)  03/21/12 162 lb (73.483 kg)  02/18/12 159 lb 1.6 oz (72.167 kg)   BP Readings from Last 3 Encounters:  05/25/12 150/90  03/21/12 140/60  02/18/12 108/65   Past Medical History  Diagnosis Date  . Fibromyalgia   . Pulmonary embolism   . RA (rheumatoid arthritis)   . Vitamin B 12 deficiency   . Vitamin D deficiency   . Osteoarthritis   . Osteopenia   . Adrenal insufficiency   . IBS (irritable bowel syndrome)   . History of blood clots 2008    below knee  . Diabetes mellitus type II     pt denies this 03-05-11  . Esophagitis   . Depression   . GERD (gastroesophageal reflux disease)   . Connective tissue disorder   . Cataracts, bilateral     more in left than right  . Osteoarthritis of left shoulder 12/07/2011   Past Surgical History  Procedure Date  . Abdominal hysterectomy   . Total knee arthroplasty     bilateral  . Cholecystectomy 2003  . Tonsillectomy 1941  . Appendectomy 1995  . Wrist surgery     bilateral  . Bilateral oophorectomy   . Tummy tuck   . Cosmetic surgery   . Total shoulder arthroplasty 12/07/2011    Procedure: TOTAL SHOULDER ARTHROPLASTY;  Surgeon: Eulas Post, MD;  Location: [facility] OR;  Service: Orthopedics;  Laterality: Left;  left total shoulder artthroplasty    reports that she has never smoked. She has never used smokeless tobacco. She reports that she drinks alcohol. She reports that she does not use illicit drugs. family history includes Breast cancer in an unspecified family  member; COPD in her father; Clotting disorder in an unspecified family member; Diabetes in her mother; Heart disease in her maternal grandmother; Ovarian cancer in her mother; and Pancreatic cancer in an unspecified family member.  There is no history of Colon cancer. Allergies  Allergen Reactions  . Latex Itching and Dermatitis    When examined with latex gloves, burns and itches in contact areas per pt.  . Alprazolam   . Arava (Leflunomide) Swelling  . Benzodiazepines   . Clonazepam   . Codeine   . Darvocet (Propoxyphene-Acetaminophen)   . Diclofenac Sodium   . Duloxetine     REACTION: achy legs  . Gabapentin     HA  . Morphine And Related   . Nortriptyline Hcl     REACTION: HA  . Penicillins     REACTION: Can take Cephalosporins  . Pramipexole Dihydrochloride     REACTION: sick, falling  . Prednisone   . Rofecoxib   . Sulfonamide Derivatives   . Venlafaxine    Current Outpatient Prescriptions on File Prior to Visit  Medication Sig Dispense Refill  . albuterol-ipratropium (COMBIVENT) 18-103 MCG/ACT inhaler Inhale 2 puffs into the lungs every 6 (six) hours as needed. For shortness of breath      . azithromycin (ZITHROMAX Z-PAK) 250 MG tablet As dirrected  6 each  0  . enoxaparin (LOVENOX) 120 MG/0.8ML injection Inject 120 mg into the skin.      Marland Kitchen esomeprazole (NEXIUM) 40 MG capsule Take 1 capsule (40 mg total) by mouth 2 (two) times daily.  30 capsule  11  . estradiol (VAGIFEM) 25 MCG vaginal tablet Place 25 mcg vaginally every other day.       . fluconazole (DIFLUCAN) 150 MG tablet Take 1 tablet (150 mg total) by mouth once.  1 tablet  1  . hydroxychloroquine (PLAQUENIL) 200 MG tablet Take 200 mg by mouth daily.      . hyoscyamine (LEVSIN SL) 0.125 MG SL tablet Place 1-2 tablets (0.125-0.25 mg total) under the tongue 4 (four) times daily as needed.  60 tablet  3  . lidocaine (LIDODERM) 5 % Place 3 patches onto the skin daily. Remove & Discard patch within 12 hours or as  directed by MD      . mupirocin cream (BACTROBAN) 2 % Apply topically daily.  25 g  0  . oxyCODONE (OXYCONTIN) 10 MG 12 hr tablet Take 1 tablet (10 mg total) by mouth every 12 (twelve) hours as needed for pain. Please fill on 05/19/12  60 tablet  0  . pregabalin (LYRICA) 25 MG capsule Take 1 capsule (25 mg total) by mouth 3 (three) times daily.  30 capsule  5  . rOPINIRole (REQUIP) 2 MG tablet Take 1 tablet (2 mg total) by mouth 3 (three) times daily.  90 tablet  5  . triazolam (HALCION) 0.25 MG tablet Take 1 tablet (0.25 mg total) by mouth at bedtime as needed.  30 tablet  5      Review of Systems  Constitutional: Negative for activity change, appetite change, fatigue and unexpected weight change.  HENT: Positive for sinus pressure. Negative for congestion and mouth sores.   Eyes: Negative for visual disturbance.  Respiratory: Negative for chest tightness.   Gastrointestinal: Negative for nausea and abdominal pain.  Genitourinary: Negative for frequency, difficulty urinating and vaginal pain.  Musculoskeletal: Positive for back pain and arthralgias. Negative for gait problem.  Skin: Negative for pallor.  Neurological: Negative for dizziness, tremors, weakness and numbness.  Psychiatric/Behavioral: Negative for confusion and sleep disturbance.   BP 150/90  Pulse 76  Temp 98.1 F (36.7 C) (Oral)  Resp 16  Wt 160 lb (72.576 kg)     Objective:   Physical Exam  Constitutional: She appears well-developed and well-nourished. No distress.  HENT:  Head: Normocephalic.  Right Ear: External ear normal.  Left Ear: External ear normal.  Nose: Nose normal.  Mouth/Throat: Oropharynx is clear and moist.  Eyes: Conjunctivae normal are normal. Pupils are equal, round, and reactive to light. Right eye exhibits no discharge. Left eye exhibits no discharge.  Neck: Normal range of motion. Neck supple. No JVD present. No tracheal deviation present. No thyromegaly present.  Cardiovascular: Normal  rate, regular rhythm and normal heart sounds.   Pulmonary/Chest: No stridor. No respiratory distress. She has no wheezes.  Abdominal: Soft. Bowel sounds are normal. She exhibits no distension and no mass. There is no tenderness. There is no rebound and no guarding.  Musculoskeletal: She exhibits tenderness. She exhibits no edema.       L shoulder is tender w/ROM  Lymphadenopathy:    She has no cervical adenopathy.  Neurological: She displays normal reflexes. No cranial nerve deficit. She exhibits normal muscle tone. Coordination normal.  Skin: No rash noted. No erythema.  Psychiatric: She has a  normal mood and affect. Her behavior is normal. Judgment and thought content normal.       sad  L dorsal forearm 1.5x1.5 cm abrasion  Lab Results  Component Value Date   WBC 3.3* 02/18/2012   HGB 12.2 02/18/2012   HCT 37.2 02/18/2012   PLT 200 02/18/2012   GLUCOSE 153* 02/18/2012   ALT 15 02/18/2012   AST 17 02/18/2012   NA 144 02/18/2012   K 4.4 02/18/2012   CL 105 02/18/2012   CREATININE 1.3* 02/18/2012   BUN 20.0 02/18/2012   CO2 27 02/18/2012   TSH 2.88 01/09/2011   INR 0.90* 02/17/2011           Assessment & Plan:

## 2012-05-25 NOTE — Assessment & Plan Note (Signed)
On Xarelto x 5-6 mo by now - doing well... Cont Rx

## 2012-05-25 NOTE — Assessment & Plan Note (Signed)
Healed

## 2012-05-25 NOTE — Assessment & Plan Note (Signed)
resolved 

## 2012-05-25 NOTE — Assessment & Plan Note (Signed)
Discussed.

## 2012-05-30 DIAGNOSIS — IMO0001 Reserved for inherently not codable concepts without codable children: Secondary | ICD-10-CM | POA: Diagnosis not present

## 2012-05-30 DIAGNOSIS — M069 Rheumatoid arthritis, unspecified: Secondary | ICD-10-CM | POA: Diagnosis not present

## 2012-05-30 DIAGNOSIS — M25549 Pain in joints of unspecified hand: Secondary | ICD-10-CM | POA: Diagnosis not present

## 2012-05-30 DIAGNOSIS — Z09 Encounter for follow-up examination after completed treatment for conditions other than malignant neoplasm: Secondary | ICD-10-CM | POA: Diagnosis not present

## 2012-06-13 DIAGNOSIS — H2510 Age-related nuclear cataract, unspecified eye: Secondary | ICD-10-CM | POA: Diagnosis not present

## 2012-06-13 DIAGNOSIS — H269 Unspecified cataract: Secondary | ICD-10-CM | POA: Diagnosis not present

## 2012-06-14 DIAGNOSIS — H2510 Age-related nuclear cataract, unspecified eye: Secondary | ICD-10-CM | POA: Diagnosis not present

## 2012-06-20 DIAGNOSIS — M069 Rheumatoid arthritis, unspecified: Secondary | ICD-10-CM | POA: Diagnosis not present

## 2012-06-27 DIAGNOSIS — H269 Unspecified cataract: Secondary | ICD-10-CM | POA: Diagnosis not present

## 2012-06-27 DIAGNOSIS — H2510 Age-related nuclear cataract, unspecified eye: Secondary | ICD-10-CM | POA: Diagnosis not present

## 2012-07-01 ENCOUNTER — Other Ambulatory Visit: Payer: Self-pay | Admitting: Internal Medicine

## 2012-07-12 ENCOUNTER — Encounter: Payer: Self-pay | Admitting: *Deleted

## 2012-07-12 NOTE — Progress Notes (Signed)
Emailed Trey Paula and Melissa to inform them patient needs to be scheduled with another provider due to non breast diagnosis.

## 2012-07-15 ENCOUNTER — Encounter: Payer: Self-pay | Admitting: Internal Medicine

## 2012-07-20 DIAGNOSIS — M069 Rheumatoid arthritis, unspecified: Secondary | ICD-10-CM | POA: Diagnosis not present

## 2012-07-20 DIAGNOSIS — Z79899 Other long term (current) drug therapy: Secondary | ICD-10-CM | POA: Diagnosis not present

## 2012-07-22 ENCOUNTER — Other Ambulatory Visit: Payer: Self-pay | Admitting: Neurosurgery

## 2012-07-22 DIAGNOSIS — M542 Cervicalgia: Secondary | ICD-10-CM

## 2012-07-22 DIAGNOSIS — M5410 Radiculopathy, site unspecified: Secondary | ICD-10-CM

## 2012-07-27 ENCOUNTER — Ambulatory Visit (INDEPENDENT_AMBULATORY_CARE_PROVIDER_SITE_OTHER): Payer: Medicare Other | Admitting: Internal Medicine

## 2012-07-27 ENCOUNTER — Encounter: Payer: Self-pay | Admitting: Internal Medicine

## 2012-07-27 VITALS — BP 132/62 | HR 68 | Temp 97.5°F | Resp 16 | Wt 162.0 lb

## 2012-07-27 DIAGNOSIS — R739 Hyperglycemia, unspecified: Secondary | ICD-10-CM

## 2012-07-27 DIAGNOSIS — R7309 Other abnormal glucose: Secondary | ICD-10-CM

## 2012-07-27 DIAGNOSIS — E538 Deficiency of other specified B group vitamins: Secondary | ICD-10-CM

## 2012-07-27 DIAGNOSIS — E559 Vitamin D deficiency, unspecified: Secondary | ICD-10-CM

## 2012-07-27 DIAGNOSIS — F3289 Other specified depressive episodes: Secondary | ICD-10-CM

## 2012-07-27 DIAGNOSIS — F329 Major depressive disorder, single episode, unspecified: Secondary | ICD-10-CM

## 2012-07-27 DIAGNOSIS — M359 Systemic involvement of connective tissue, unspecified: Secondary | ICD-10-CM

## 2012-07-27 DIAGNOSIS — M069 Rheumatoid arthritis, unspecified: Secondary | ICD-10-CM | POA: Diagnosis not present

## 2012-07-27 DIAGNOSIS — Z86718 Personal history of other venous thrombosis and embolism: Secondary | ICD-10-CM | POA: Diagnosis not present

## 2012-07-27 DIAGNOSIS — IMO0001 Reserved for inherently not codable concepts without codable children: Secondary | ICD-10-CM

## 2012-07-27 DIAGNOSIS — K209 Esophagitis, unspecified without bleeding: Secondary | ICD-10-CM

## 2012-07-27 MED ORDER — OXYCODONE HCL 10 MG PO TB12: 10.0000 mg | ORAL_TABLET | Freq: Two times a day (BID) | ORAL | Status: DC | PRN

## 2012-07-27 NOTE — Assessment & Plan Note (Signed)
Continue with current prescription therapy as reflected on the Med list.  

## 2012-07-27 NOTE — Assessment & Plan Note (Signed)
Labs

## 2012-07-27 NOTE — Assessment & Plan Note (Signed)
On Xarelto 

## 2012-07-27 NOTE — Progress Notes (Signed)
Subjective:     HPI  C/o R CTS  and neck pain - Dr Jeral Fruit is planning to do surgeries: myelogram is pending...  S/p L shoulder replacement 12/07/11 by Dr Dion Saucier. F/u pain in L arm - better   The patient is here to follow up on chronic depression, anxiety, headaches and chronic moderate fibromyalgia symptoms controlled with medicines, stable. Xarelto - we switched her to it due to Lovenox cost a few months ago. Xarelto is $400/mo.Marland KitchenMarland KitchenShe is on Sympony for RA q 2 mo  Wt Readings from Last 3 Encounters:  07/27/12 162 lb (73.483 kg)  05/25/12 160 lb (72.576 kg)  03/21/12 162 lb (73.483 kg)   BP Readings from Last 3 Encounters:  07/27/12 132/62  05/25/12 150/90  03/21/12 140/60   Past Medical History  Diagnosis Date  . Fibromyalgia   . Pulmonary embolism   . RA (rheumatoid arthritis)   . Vitamin B 12 deficiency   . Vitamin D deficiency   . Osteoarthritis   . Osteopenia   . Adrenal insufficiency   . IBS (irritable bowel syndrome)   . History of blood clots 2008    below knee  . Diabetes mellitus type II     pt denies this 03-05-11  . Esophagitis   . Depression   . GERD (gastroesophageal reflux disease)   . Connective tissue disorder   . Cataracts, bilateral     more in left than right  . Osteoarthritis of left shoulder 12/07/2011   Past Surgical History  Procedure Laterality Date  . Abdominal hysterectomy    . Total knee arthroplasty      bilateral  . Cholecystectomy  2003  . Tonsillectomy  1941  . Appendectomy  1995  . Wrist surgery      bilateral  . Bilateral oophorectomy    . Tummy tuck    . Cosmetic surgery    . Total shoulder arthroplasty  12/07/2011    Procedure: TOTAL SHOULDER ARTHROPLASTY;  Surgeon: Eulas Post, MD;  Location: [facility] OR;  Service: Orthopedics;  Laterality: Left;  left total shoulder artthroplasty    reports that she has never smoked. She has never used smokeless tobacco. She reports that  drinks alcohol. She reports that she does not use  illicit drugs. family history includes Breast cancer in an unspecified family member; COPD in her father; Clotting disorder in an unspecified family member; Diabetes in her mother; Heart disease in her maternal grandmother; Ovarian cancer in her mother; and Pancreatic cancer in an unspecified family member.  There is no history of Colon cancer. Allergies  Allergen Reactions  . Latex Itching and Dermatitis    When examined with latex gloves, burns and itches in contact areas per pt.  . Alprazolam   . Arava (Leflunomide) Swelling  . Benzodiazepines   . Clonazepam   . Codeine   . Darvocet (Propoxyphene-Acetaminophen)   . Diclofenac Sodium   . Duloxetine     REACTION: achy legs  . Gabapentin     HA  . Morphine And Related   . Nortriptyline Hcl     REACTION: HA  . Penicillins     REACTION: Can take Cephalosporins  . Pramipexole Dihydrochloride     REACTION: sick, falling  . Prednisone   . Rofecoxib   . Sulfonamide Derivatives   . Venlafaxine    Current Outpatient Prescriptions on File Prior to Visit  Medication Sig Dispense Refill  . albuterol-ipratropium (COMBIVENT) 18-103 MCG/ACT inhaler Inhale 2 puffs into the  lungs every 6 (six) hours as needed. For shortness of breath      . azithromycin (ZITHROMAX Z-PAK) 250 MG tablet As dirrected  6 each  0  . esomeprazole (NEXIUM) 40 MG capsule Take 1 capsule (40 mg total) by mouth 2 (two) times daily.  30 capsule  11  . estradiol (VAGIFEM) 25 MCG vaginal tablet Place 25 mcg vaginally every other day.       . fluconazole (DIFLUCAN) 150 MG tablet Take 1 tablet (150 mg total) by mouth once.  1 tablet  1  . hyoscyamine (LEVSIN SL) 0.125 MG SL tablet Place 1-2 tablets (0.125-0.25 mg total) under the tongue 4 (four) times daily as needed.  60 tablet  3  . lidocaine (LIDODERM) 5 % Place 3 patches onto the skin daily. Remove & Discard patch within 12 hours or as directed by MD      . meclizine (ANTIVERT) 12.5 MG tablet Take 1-2 tablets (12.5-25 mg  total) by mouth 3 (three) times daily as needed for dizziness.  60 tablet  1  . mupirocin cream (BACTROBAN) 2 % Apply topically daily.  25 g  0  . oxyCODONE (OXYCONTIN) 10 MG 12 hr tablet Take 1 tablet (10 mg total) by mouth every 12 (twelve) hours as needed for pain. Please fill on 07/17/12  60 tablet  0  . pregabalin (LYRICA) 25 MG capsule Take 1 capsule (25 mg total) by mouth 3 (three) times daily.  30 capsule  5  . Rivaroxaban (XARELTO) 20 MG TABS Take 1 tablet (20 mg total) by mouth daily.  100 tablet  3  . rOPINIRole (REQUIP) 2 MG tablet Take 1 tablet (2 mg total) by mouth 3 (three) times daily.  90 tablet  5  . triazolam (HALCION) 0.25 MG tablet Take 1 tablet (0.25 mg total) by mouth at bedtime as needed.  30 tablet  5  . VAGIFEM 10 MCG TABS INSERT ONE TABLET VAGINALLY EVERY 2 (TWO) DAYS  15 tablet  1   No current facility-administered medications on file prior to visit.      Review of Systems  Constitutional: Negative for activity change, appetite change, fatigue and unexpected weight change.  HENT: Positive for sinus pressure. Negative for congestion and mouth sores.   Eyes: Negative for visual disturbance.  Respiratory: Negative for chest tightness.   Gastrointestinal: Negative for nausea and abdominal pain.  Genitourinary: Negative for frequency, difficulty urinating and vaginal pain.  Musculoskeletal: Positive for back pain and arthralgias. Negative for gait problem.  Skin: Negative for pallor.  Neurological: Negative for dizziness, tremors, weakness and numbness.  Psychiatric/Behavioral: Negative for confusion and sleep disturbance.   BP 132/62  Pulse 68  Temp(Src) 97.5 F (36.4 C) (Oral)  Resp 16  Wt 162 lb (73.483 kg)  BMI 29.62 kg/m2     Objective:   Physical Exam  Constitutional: She appears well-developed and well-nourished. No distress.  HENT:  Head: Normocephalic.  Right Ear: External ear normal.  Left Ear: External ear normal.  Nose: Nose normal.   Mouth/Throat: Oropharynx is clear and moist.  Eyes: Conjunctivae are normal. Pupils are equal, round, and reactive to light. Right eye exhibits no discharge. Left eye exhibits no discharge.  Neck: Normal range of motion. Neck supple. No JVD present. No tracheal deviation present. No thyromegaly present.  Cardiovascular: Normal rate, regular rhythm and normal heart sounds.   Pulmonary/Chest: No stridor. No respiratory distress. She has no wheezes.  Abdominal: Soft. Bowel sounds are normal. She exhibits no  distension and no mass. There is no tenderness. There is no rebound and no guarding.  Musculoskeletal: She exhibits tenderness. She exhibits no edema.  L shoulder is not tender w/ROM Neck is tender w/ROM  Lymphadenopathy:    She has no cervical adenopathy.  Neurological: She displays normal reflexes. No cranial nerve deficit. She exhibits normal muscle tone. Coordination normal.  Skin: No rash noted. No erythema.  Psychiatric: She has a normal mood and affect. Her behavior is normal. Judgment and thought content normal.  sad  weak R grip  Lab Results  Component Value Date   WBC 3.3* 02/18/2012   HGB 12.2 02/18/2012   HCT 37.2 02/18/2012   PLT 200 02/18/2012   GLUCOSE 153* 02/18/2012   ALT 15 02/18/2012   AST 17 02/18/2012   NA 144 02/18/2012   K 4.4 02/18/2012   CL 105 02/18/2012   CREATININE 1.3* 02/18/2012   BUN 20.0 02/18/2012   CO2 27 02/18/2012   TSH 2.88 01/09/2011   INR 0.90* 02/17/2011           Assessment & Plan:

## 2012-07-27 NOTE — Assessment & Plan Note (Addendum)
Continue with current prescription therapy as reflected on the Med list.  

## 2012-07-27 NOTE — Assessment & Plan Note (Signed)
On Sympony q 2 mo

## 2012-07-28 ENCOUNTER — Inpatient Hospital Stay
Admission: RE | Admit: 2012-07-28 | Discharge: 2012-07-28 | Disposition: A | Discharge status: Home / Self Care | Payer: Medicare Other | Source: Ambulatory Visit | Attending: Neurosurgery | Admitting: Neurosurgery

## 2012-07-28 ENCOUNTER — Inpatient Hospital Stay: Admission: RE | Admit: 2012-07-28 | Payer: Medicare Other | Source: Ambulatory Visit

## 2012-08-03 ENCOUNTER — Ambulatory Visit
Admission: RE | Admit: 2012-08-03 | Discharge: 2012-08-03 | Disposition: A | Discharge status: Home / Self Care | Payer: Medicare Other | Source: Ambulatory Visit | Attending: Neurosurgery | Admitting: Neurosurgery

## 2012-08-03 VITALS — BP 136/59 | HR 66

## 2012-08-03 DIAGNOSIS — M5410 Radiculopathy, site unspecified: Secondary | ICD-10-CM

## 2012-08-03 DIAGNOSIS — M4802 Spinal stenosis, cervical region: Secondary | ICD-10-CM | POA: Diagnosis not present

## 2012-08-03 DIAGNOSIS — M542 Cervicalgia: Secondary | ICD-10-CM

## 2012-08-03 MED ORDER — ONDANSETRON HCL 4 MG/2ML IJ SOLN
4.0000 mg | Freq: Once | INTRAMUSCULAR | Status: AC
  Administered 2012-08-03: 4 mg via INTRAMUSCULAR

## 2012-08-03 MED ORDER — MEPERIDINE HCL 100 MG/ML IJ SOLN
75.0000 mg | Freq: Once | INTRAMUSCULAR | Status: AC
  Administered 2012-08-03: 75 mg via INTRAMUSCULAR

## 2012-08-03 MED ORDER — IOHEXOL 300 MG/ML  SOLN
10.0000 mL | Freq: Once | INTRAMUSCULAR | Status: AC | PRN
  Administered 2012-08-03: 10 mL via INTRATHECAL

## 2012-08-03 NOTE — Progress Notes (Signed)
Patient states she has been off Xarelto for the past two days.  Discharge instructions explained to patient.

## 2012-08-15 ENCOUNTER — Telehealth: Payer: Self-pay

## 2012-08-15 DIAGNOSIS — I714 Abdominal aortic aneurysm, without rupture: Secondary | ICD-10-CM

## 2012-08-15 NOTE — Telephone Encounter (Signed)
Ok Done Thx 

## 2012-08-15 NOTE — Telephone Encounter (Signed)
Phone call from pt stating she needs a referral to Dr Payton Mccallum office(cardiac/thoracic surgeon for the aneurysm found in her ascending aorta. (myelogram and CT done 08/03/12)

## 2012-08-16 ENCOUNTER — Telehealth: Payer: Self-pay | Admitting: Internal Medicine

## 2012-08-16 NOTE — Telephone Encounter (Signed)
Caller: Diane Abbott/Patient; Phone: [phone]; Reason for Call: Calling to get referl to Dr.  Laneta Simmers because of the scan results.

## 2012-08-16 NOTE — Telephone Encounter (Signed)
done

## 2012-08-16 NOTE — Telephone Encounter (Signed)
Phone call to pt and LM letting her know the order was placed.

## 2012-08-18 ENCOUNTER — Encounter: Payer: Self-pay | Admitting: Internal Medicine

## 2012-08-18 ENCOUNTER — Telehealth: Payer: Self-pay | Admitting: Internal Medicine

## 2012-08-18 DIAGNOSIS — I712 Thoracic aortic aneurysm, without rupture, unspecified: Secondary | ICD-10-CM

## 2012-08-18 NOTE — Telephone Encounter (Signed)
Ok Thx 

## 2012-08-18 NOTE — Telephone Encounter (Signed)
Per Allsion @ [facility]  Pt need  Have a  ct angio chest abdomen pelvis prior to coming to  Their office  Because her dx was only seen on a myelogram and that's not beneficia to  The DR   And  It cant be measure correctly . Once is schedule then they can see pt . Per allsion [phone] also can pt be seen by other dr instead of dr Hortencia Pilar

## 2012-08-18 NOTE — Telephone Encounter (Signed)
Will schedule Thx

## 2012-08-18 NOTE — Telephone Encounter (Signed)
Pt scheduled for  08-24-2012@10 :30  For her  CT ANGIO CHEST PE W/CM &/OR WO CM and CT ANGIO ABDOMEN W/CM &/OR WO CONTRAST....per orders rose @[facility]  CT pt need and order put in for [facility] [employer] [facility]

## 2012-08-19 ENCOUNTER — Telehealth: Payer: Self-pay | Admitting: Internal Medicine

## 2012-08-19 NOTE — Telephone Encounter (Signed)
PT called very concerned about her scheduled angiogram. She stated that she was under the assumption it was a CT scan and would like to speak with a nurse about the details, asap. Please assist.

## 2012-08-22 ENCOUNTER — Encounter: Payer: Self-pay | Admitting: *Deleted

## 2012-08-22 DIAGNOSIS — M069 Rheumatoid arthritis, unspecified: Secondary | ICD-10-CM | POA: Diagnosis not present

## 2012-08-22 DIAGNOSIS — IMO0001 Reserved for inherently not codable concepts without codable children: Secondary | ICD-10-CM | POA: Diagnosis not present

## 2012-08-22 NOTE — Telephone Encounter (Signed)
It is a CT angio Thx

## 2012-08-22 NOTE — Telephone Encounter (Signed)
Pt informed of order

## 2012-08-24 ENCOUNTER — Ambulatory Visit (INDEPENDENT_AMBULATORY_CARE_PROVIDER_SITE_OTHER)
Admission: RE | Admit: 2012-08-24 | Discharge: 2012-08-24 | Disposition: A | Discharge status: Home / Self Care | Payer: Medicare Other | Source: Ambulatory Visit | Attending: Internal Medicine | Admitting: Internal Medicine

## 2012-08-24 DIAGNOSIS — D175 Benign lipomatous neoplasm of intra-abdominal organs: Secondary | ICD-10-CM | POA: Diagnosis not present

## 2012-08-24 DIAGNOSIS — I712 Thoracic aortic aneurysm, without rupture, unspecified: Secondary | ICD-10-CM | POA: Diagnosis not present

## 2012-08-24 MED ORDER — IOHEXOL 350 MG/ML SOLN
100.0000 mL | Freq: Once | INTRAVENOUS | Status: AC | PRN
  Administered 2012-08-24: 100 mL via INTRAVENOUS

## 2012-08-29 ENCOUNTER — Other Ambulatory Visit: Payer: Self-pay | Admitting: Internal Medicine

## 2012-08-31 ENCOUNTER — Institutional Professional Consult (permissible substitution) (INDEPENDENT_AMBULATORY_CARE_PROVIDER_SITE_OTHER): Payer: Medicare Other | Admitting: Surgery

## 2012-08-31 ENCOUNTER — Encounter: Payer: Self-pay | Admitting: Surgery

## 2012-08-31 VITALS — BP 152/70 | HR 90 | Resp 18 | Ht 62.0 in | Wt 162.0 lb

## 2012-08-31 DIAGNOSIS — I712 Thoracic aortic aneurysm, without rupture, unspecified: Secondary | ICD-10-CM

## 2012-09-01 ENCOUNTER — Other Ambulatory Visit: Payer: Self-pay | Admitting: *Deleted

## 2012-09-01 DIAGNOSIS — J449 Chronic obstructive pulmonary disease, unspecified: Secondary | ICD-10-CM

## 2012-09-01 DIAGNOSIS — I7101 Dissection of thoracic aorta: Secondary | ICD-10-CM

## 2012-09-01 DIAGNOSIS — I712 Thoracic aortic aneurysm, without rupture: Secondary | ICD-10-CM

## 2012-09-03 ENCOUNTER — Encounter: Payer: Self-pay | Admitting: Surgery

## 2012-09-03 NOTE — Progress Notes (Signed)
[address]       [place] [address]             [phone]      PCP is Sonda Primes, MD Referring Provider is Plotnikov, Georgina Quint, MD  Chief Complaint  Patient presents with  . Thoracic Aortic Aneurysm    Surgical eval for ascending aortic aneurysm, Chest/Abd/Pelvic CT 08/24/12    HPI:  The patient is a 78 year old white female with a hx of fibromyalgia, rheumatoid and degenerative arthritis, possible familial hypercoagulable state with hx of DVT and PE on chronic Xarelto who was being evaluated for degenerative disease of cervical spine by Dr. Jeral Fruit. MRI of the cervical spine showed a 5 cm ascending aortic aneurysm. CTA of the chest shows the maximum diameter of the ascending aorta to be 5.8 cm with the aneurysm extending from the root to the mid portion of the aortic arch.The aorta has increased in size from 4.5 cm in 2009. She does get some chest pains and back pain but she has fibromyalgia and significant arthritis so she doesn't think much about it.  Past Medical History  Diagnosis Date  . Fibromyalgia   . Pulmonary embolism   . RA (rheumatoid arthritis)   . Vitamin B 12 deficiency   . Vitamin D deficiency   . Osteoarthritis   . Osteopenia   . Adrenal insufficiency   . IBS (irritable bowel syndrome)   . History of blood clots 2008    below knee  . Diabetes mellitus type II     pt denies this 03-05-11  . Esophagitis   . Depression   . GERD (gastroesophageal reflux disease)   . Connective tissue disorder   . Cataracts, bilateral     more in left than right  . Osteoarthritis of left shoulder 12/07/2011    Past Surgical History  Procedure Laterality Date  . Abdominal hysterectomy    . Total knee arthroplasty      bilateral  . Cholecystectomy  2003  . Tonsillectomy  1941  . Appendectomy  1995  . Wrist surgery      bilateral  . Bilateral oophorectomy    . Tummy tuck    . Cosmetic surgery    . Total shoulder arthroplasty  12/07/2011   Procedure: TOTAL SHOULDER ARTHROPLASTY;  Surgeon: Eulas Post, MD;  Location: [facility] OR;  Service: Orthopedics;  Laterality: Left;  left total shoulder artthroplasty    Family History  Problem Relation Age of Onset  . Ovarian cancer Mother   . Heart disease Maternal Grandmother   . Clotting disorder    . Breast cancer    . Diabetes Mother   . Pancreatic cancer    . COPD Father   . Colon cancer Neg Hx     Social History History  Substance Use Topics  . Smoking status: Never Smoker   . Smokeless tobacco: Never Used  . Alcohol Use: Yes    Current Outpatient Prescriptions  Medication Sig Dispense Refill  . esomeprazole (NEXIUM) 40 MG capsule Take 40 mg by mouth as needed.      . hyoscyamine (LEVSIN SL) 0.125 MG SL tablet Place 0.125-0.25 mg under the tongue as needed.      Marland Kitchen oxyCODONE (OXYCONTIN) 10 MG 12 hr tablet Take 1 tablet (10 mg total) by mouth every 12 (twelve) hours as needed for pain. Please fill on 09/16/12  60 tablet  0  . Rivaroxaban (XARELTO) 20 MG TABS Take 1 tablet (  20 mg total) by mouth daily.  100 tablet  3  . rOPINIRole (REQUIP) 2 MG tablet TAKE ONE TABLET BY MOUTH THREE TIMES DAILY  90 tablet  0  . triazolam (HALCION) 0.25 MG tablet Take 1 tablet (0.25 mg total) by mouth at bedtime as needed.  30 tablet  5  . VAGIFEM 10 MCG TABS INSERT ONE TABLET VAGINALLY EVERY 2 (TWO) DAYS  15 tablet  1  . fluconazole (DIFLUCAN) 150 MG tablet Take 150 mg by mouth once. PRN ONLY with ABX       No current facility-administered medications for this visit.    Allergies  Allergen Reactions  . Codeine Other (See Comments)    Blood pressure drops; "I pass out."  . Pramipexole Dihydrochloride Nausea Only    sick, falling  . Rofecoxib Other (See Comments)    "sleep walks"  . Arava (Leflunomide) Swelling  . Clonazepam   . Diclofenac Sodium   . Venlafaxine   . Alprazolam Other (See Comments)    "Makes me mean"  . Benzodiazepines Other (See Comments)    "Halllucinations?"  .  Darvocet (Propoxyphene-Acetaminophen) Other (See Comments)    "makes my eyes ache"  . Duloxetine Other (See Comments)    achy legs  . Gabapentin Other (See Comments)    headache  . Latex Itching and Dermatitis    When examined with latex gloves, burns and itches in contact areas per pt.  . Morphine And Related Other (See Comments)    Hallucinations   . Nortriptyline Hcl Other (See Comments)    Headache   . Penicillins Rash    Can take Cephalosporins  . Prednisone Swelling    Just doesn't want to take it.    Review of Systems  Constitutional: Positive for fatigue and unexpected weight change.       Weight gain.  HENT: Positive for hearing loss.   Eyes: Positive for visual disturbance.  Respiratory: Positive for cough, shortness of breath and wheezing.   Cardiovascular: Positive for chest pain.  Gastrointestinal: Positive for diarrhea and constipation.       Hiatal hernia  Genitourinary: Positive for frequency.  Musculoskeletal: Positive for myalgias, back pain, joint swelling and arthralgias.  Skin: Negative.   Neurological: Positive for headaches.       Neuropathy, chronic pain  Hematological: Negative.   Psychiatric/Behavioral: Negative.     BP 152/70  Pulse 90  Resp 18  Ht 5\' 2"  (1.575 m)  Wt 162 lb (73.483 kg)  BMI 29.62 kg/m2  SpO2 98% Physical Exam  Constitutional: She is oriented to person, place, and time. She appears well-nourished. No distress.  HENT:  Head: Normocephalic and atraumatic.  Mouth/Throat: Oropharynx is clear and moist.  Eyes: Conjunctivae and EOM are normal. Pupils are equal, round, and reactive to light.  Neck: Normal range of motion. Neck supple. No JVD present. No tracheal deviation present. No thyromegaly present.  Cardiovascular: Normal rate, regular rhythm and intact distal pulses.   Murmur heard. 2/6 diastolic murmur at apex  Pulmonary/Chest: Effort normal and breath sounds normal. No respiratory distress. She has no wheezes. She  has no rales.  Abdominal: Soft. Bowel sounds are normal. She exhibits no distension and no mass. There is no tenderness.  Musculoskeletal: Normal range of motion. She exhibits no edema and no tenderness.  Lymphadenopathy:    She has no cervical adenopathy.  Neurological: She is alert and oriented to person, place, and time. No cranial nerve deficit or sensory deficit.  Skin: Skin  is warm and dry.  Psychiatric: She has a normal mood and affect.     Diagnostic Tests:  Art Joseph Art, MD   Wed Aug 24, 2012  1:14:08 PM EDT       **ADDENDUM** CREATED: 08/24/2012 13:11:59   IMA is diminutive and patent.  Branch vessels are grossly patent.   **END ADDENDUM** SIGNED BY: Marlowe Aschoff. Hoss, M.D.     Study Result    *RADIOLOGY REPORT*   Clinical Data:  Thoracic aortic aneurysm.  Pre stent graft.   CT ANGIOGRAPHY CHEST, ABDOMEN AND PELVIS   Technique:  Multidetector CT imaging through the chest, abdomen and pelvis was performed using the standard protocol during bolus administration of intravenous contrast.  Multiplanar reconstructed images including MIPs were obtained and reviewed to evaluate the vascular anatomy.   Contrast: , OMNIPAQUE IOHEXOL 350 MG/ML SOLN   Comparison:  07/07/2007 and multiple other studies.   CTA CHEST   Findings:  Aneurysmal dilatation of the ascending aorta has worsened.  Maximal diameters of the ascending aorta the at the sinus of Valsalva, sinotubular junction, and ascending aorta are 3.2 cm, 3.3 cm, and 5.8 cm respectively.  There is slight kinking of the thoracic aorta at the junction between the arch and proximal descending aorta without significant obstruction.   Innominate artery, right subclavian artery, right common carotid artery, left common carotid artery the, and left subclavian artery are patent.  All of these vessels are tortuous.  Proximal left subclavian artery is ectatic with maximal diameter of 15 mm. Innominate artery is minimally  ectatic at 13 mm.  Right vertebral artery is grossly patent.  Left vertebral artery is also grossly patent.   Descending aorta is nonaneurysmal and patent without significant plaque.  It is tortuous.   No obvious filling defects in the pulmonary arterial tree to suggest acute pulmonary thromboembolism.   Borderline enlarged right paratracheal node on image 30 measures 10 mm in short axis diameter.  This has improved.  No pericardial effusion.   No pneumothorax or pleural effusion.   Minimal ground-glass in the posterior upper left upper lobe on image 24 is stable.   Left shoulder arthroplasty is in place.  No definite acute rib fracture.  Stable T3 compression deformity.  Stable L1 compression compared with lateral radiographs from Sep 10, 2011.   Unremarkable thyroid gland.    Review of the MIP images confirms the above findings.   IMPRESSION: Aneurysmal dilatation of the ascending aorta has increased from 4.5 cm to 5.8 cm as described.   Chronic findings are noted.   CTA ABDOMEN AND PELVIS   Findings:  The aorta is nonaneurysmal and patent with mild diffuse atherosclerotic calcifications.  Celiac and SMA are widely patent. Branch vessels of the celiac and SMA are grossly patent.  Expected branching anatomy is noted.  Renal arteries are patent. There is some irregularity of the mid right renal artery with a lobulated appearance.  Early fibromuscular dysplasia is not excluded.   Common, internal, and external iliac arteries are all widely patent with mild tortuosity and mild vascular calcifications.   Venous phase images demonstrate patency of the portal, superior mesenteric, and splenic veins.  IVC and renal veins are patent. Right gonadal vein is prominent.  Left gonadal vein is prominent. No obvious venous reflux however.   Post cholecystectomy.  Diffuse hepatic steatosis.  No focal liver mass.   Spleen, pancreas, adrenal glands are within normal limits.   Chronic changes of the kidneys.  Tiny 3 mm  angiomyolipoma in the lower pole of the left kidney.  See image 19 of series 7.   Uterus is absent.  Bladder and adnexa are within normal limits.   Chronic changes at the pubic symphysis.  No acute bony deformity. Advanced degenerative disc disease at L5-S1 are noted. Anterolisthesis T12 upon L1 and L4 upon L5 have a chronic appearance.  There is associated facet arthropathy at these levels.    Review of the MIP images confirms the above findings.   IMPRESSION: No evidence of aortic aneurysm.   The right renal artery is somewhat lobulated.  Early fibromuscular dysplasia is not excluded.  Correlate with a history of uncontrolled hypertension.   Tiny angiomyolipoma in the left kidney.   Diffuse hepatic steatosis.     Original Report Authenticated By: Jolaine Click, M.D.     Impression/Plan:  She has a 5.8 cm fusiform ascending aortic and arch areurysm that has increased in size from 4.5 cm in 2009. She is 38 but still fairly vibrant and active despite her arthritis, joint replacements, and fibromyalgia. I think surgical repair is indicated to prevent dissection or rupture. This will require reimplantation or replacement of her aortic valve and replacement of the aorta from the annulus to the mid arch. She will require Right and Left Cardiac Cath and echo preop. Her daughter-in-law gave her the name of a couple cardiologists that she would like her to see and she will call us back to let us know so that we can schedule a consultation for cath. I reviewed the CTA with the patient and her son and discussed the surgery. I answered all of their questions at this time. I will see her back after cath to discuss the results and make plans for surgery.

## 2012-09-08 ENCOUNTER — Ambulatory Visit (HOSPITAL_COMMUNITY)
Admission: RE | Admit: 2012-09-08 | Discharge: 2012-09-08 | Disposition: A | Discharge status: Home / Self Care | Payer: Medicare Other | Source: Ambulatory Visit | Attending: Surgery | Admitting: Surgery

## 2012-09-08 DIAGNOSIS — Z01810 Encounter for preprocedural cardiovascular examination: Secondary | ICD-10-CM

## 2012-09-08 DIAGNOSIS — I712 Thoracic aortic aneurysm, without rupture, unspecified: Secondary | ICD-10-CM | POA: Insufficient documentation

## 2012-09-08 DIAGNOSIS — I6529 Occlusion and stenosis of unspecified carotid artery: Secondary | ICD-10-CM | POA: Diagnosis not present

## 2012-09-08 DIAGNOSIS — Z01811 Encounter for preprocedural respiratory examination: Secondary | ICD-10-CM | POA: Diagnosis not present

## 2012-09-08 DIAGNOSIS — J449 Chronic obstructive pulmonary disease, unspecified: Secondary | ICD-10-CM

## 2012-09-08 DIAGNOSIS — E119 Type 2 diabetes mellitus without complications: Secondary | ICD-10-CM | POA: Insufficient documentation

## 2012-09-08 DIAGNOSIS — I7101 Dissection of thoracic aorta: Secondary | ICD-10-CM

## 2012-09-08 DIAGNOSIS — I359 Nonrheumatic aortic valve disorder, unspecified: Secondary | ICD-10-CM

## 2012-09-08 DIAGNOSIS — J4489 Other specified chronic obstructive pulmonary disease: Secondary | ICD-10-CM | POA: Insufficient documentation

## 2012-09-08 LAB — PULMONARY FUNCTION TEST

## 2012-09-08 NOTE — Progress Notes (Signed)
Pre-op Cardiac Surgery  Carotid Findings:  Right: 40-59% ICA stenosis.  Left: No significant ICA stenosis.  Vertebral artery flow is antegrade.  Tortuous vessels bilaterally.  Upper Extremity Right Left  Brachial Pressures 139T 144T  Radial Waveforms T T  Ulnar Waveforms T T  Palmar Arch (Allen's Test) WNL WNL   Findings:  Palpable    Lower  Extremity Right Left  Dorsalis Pedis    Anterior Tibial    Posterior Tibial    Ankle/Brachial Indices      Findings:         Richetta Cubillos, RVT 09/08/2012, 2:59 PM

## 2012-09-08 NOTE — Progress Notes (Signed)
  Echocardiogram 2D Echocardiogram has been performed.  Diane Abbott 09/08/2012, 11:23 AM

## 2012-09-12 ENCOUNTER — Other Ambulatory Visit: Payer: Self-pay | Admitting: Medical Oncology

## 2012-09-13 ENCOUNTER — Other Ambulatory Visit: Payer: Medicare Other | Admitting: Lab

## 2012-09-13 ENCOUNTER — Encounter: Payer: Self-pay | Admitting: Cardiology

## 2012-09-13 ENCOUNTER — Ambulatory Visit: Payer: Medicare Other | Admitting: Oncology

## 2012-09-13 ENCOUNTER — Encounter: Payer: Self-pay | Admitting: *Deleted

## 2012-09-13 ENCOUNTER — Other Ambulatory Visit: Payer: Self-pay | Admitting: *Deleted

## 2012-09-13 ENCOUNTER — Encounter: Payer: Self-pay | Admitting: Internal Medicine

## 2012-09-13 ENCOUNTER — Ambulatory Visit (INDEPENDENT_AMBULATORY_CARE_PROVIDER_SITE_OTHER): Payer: Medicare Other | Admitting: Cardiology

## 2012-09-13 ENCOUNTER — Ambulatory Visit (HOSPITAL_BASED_OUTPATIENT_CLINIC_OR_DEPARTMENT_OTHER): Payer: Medicare Other | Admitting: Internal Medicine

## 2012-09-13 ENCOUNTER — Other Ambulatory Visit (HOSPITAL_BASED_OUTPATIENT_CLINIC_OR_DEPARTMENT_OTHER): Payer: Medicare Other | Admitting: Lab

## 2012-09-13 VITALS — BP 132/72 | HR 82 | Ht 62.0 in | Wt 164.0 lb

## 2012-09-13 VITALS — BP 115/61 | HR 72 | Temp 97.5°F | Resp 18 | Ht 62.0 in | Wt 164.2 lb

## 2012-09-13 DIAGNOSIS — I2699 Other pulmonary embolism without acute cor pulmonale: Secondary | ICD-10-CM | POA: Diagnosis not present

## 2012-09-13 DIAGNOSIS — J449 Chronic obstructive pulmonary disease, unspecified: Secondary | ICD-10-CM | POA: Diagnosis not present

## 2012-09-13 DIAGNOSIS — I82409 Acute embolism and thrombosis of unspecified deep veins of unspecified lower extremity: Secondary | ICD-10-CM | POA: Diagnosis not present

## 2012-09-13 DIAGNOSIS — I712 Thoracic aortic aneurysm, without rupture, unspecified: Secondary | ICD-10-CM

## 2012-09-13 DIAGNOSIS — E538 Deficiency of other specified B group vitamins: Secondary | ICD-10-CM | POA: Diagnosis not present

## 2012-09-13 DIAGNOSIS — R079 Chest pain, unspecified: Secondary | ICD-10-CM

## 2012-09-13 DIAGNOSIS — I359 Nonrheumatic aortic valve disorder, unspecified: Secondary | ICD-10-CM

## 2012-09-13 DIAGNOSIS — R7309 Other abnormal glucose: Secondary | ICD-10-CM | POA: Diagnosis not present

## 2012-09-13 DIAGNOSIS — R739 Hyperglycemia, unspecified: Secondary | ICD-10-CM

## 2012-09-13 DIAGNOSIS — Z01810 Encounter for preprocedural cardiovascular examination: Secondary | ICD-10-CM | POA: Diagnosis not present

## 2012-09-13 DIAGNOSIS — I351 Nonrheumatic aortic (valve) insufficiency: Secondary | ICD-10-CM

## 2012-09-13 LAB — CBC
HCT: 39.4 % (ref 36.0–46.0)
Hemoglobin: 13.1 g/dL (ref 12.0–15.0)
MCHC: 33.1 g/dL (ref 30.0–36.0)
MCV: 90.8 fl (ref 78.0–100.0)
RDW: 15.1 % — ABNORMAL HIGH (ref 11.5–14.6)

## 2012-09-13 LAB — COMPREHENSIVE METABOLIC PANEL (CC13)
ALT: 12 U/L (ref 0–55)
AST: 18 U/L (ref 5–34)
Calcium: 9.4 mg/dL (ref 8.4–10.4)
Chloride: 105 mEq/L (ref 98–107)
Creatinine: 1.2 mg/dL — ABNORMAL HIGH (ref 0.6–1.1)
Sodium: 140 mEq/L (ref 136–145)
Total Protein: 7.2 g/dL (ref 6.4–8.3)

## 2012-09-13 LAB — CBC WITH DIFFERENTIAL/PLATELET
BASO%: 1.3 % (ref 0.0–2.0)
EOS%: 2.9 % (ref 0.0–7.0)
HCT: 38.8 % (ref 34.8–46.6)
MCH: 29.9 pg (ref 25.1–34.0)
MCHC: 32.8 g/dL (ref 31.5–36.0)
MONO#: 0.4 10*3/uL (ref 0.1–0.9)
NEUT%: 42.5 % (ref 38.4–76.8)
RBC: 4.26 10*6/uL (ref 3.70–5.45)
RDW: 14.7 % — ABNORMAL HIGH (ref 11.2–14.5)
WBC: 3.8 10*3/uL — ABNORMAL LOW (ref 3.9–10.3)
lymph#: 1.7 10*3/uL (ref 0.9–3.3)

## 2012-09-13 LAB — BASIC METABOLIC PANEL
BUN: 25 mg/dL — ABNORMAL HIGH (ref 6–23)
CO2: 29 mEq/L (ref 19–32)
Chloride: 104 mEq/L (ref 96–112)
Creatinine, Ser: 1.2 mg/dL (ref 0.4–1.2)
Glucose, Bld: 97 mg/dL (ref 70–99)
Potassium: 4.6 mEq/L (ref 3.5–5.1)

## 2012-09-13 NOTE — Patient Instructions (Addendum)
SEE LETTER FOR HEART CATH AND FOLLOW THE INSTRUCTIONS  Your physician recommends that you return for lab work in: TODAY, BMET CBC   Your physician recommends that you schedule a follow-up appointment in: 2 MONTHS

## 2012-09-13 NOTE — Progress Notes (Signed)
[facility] [phone]   [phone]  OFFICE PROGRESS NOTE  Sonda Primes, MD [address] [facility] [address] [place] [address]  DIAGNOSIS: History of DVT and pulmonary embolism with Family history hypercoagulopathy.  PRIOR THERAPY: None  CURRENT THERAPY: Intermittent treatment with Xarelto  INTERVAL HISTORY: Diane Abbott 78 y.o. female returns to the clinic today for followup visit. She the patient Dr. Donnie Coffin who came today to see me after he left the practice. The patient is feeling fine today with no specific complaints. She has been on intermittent anticoagulation for questionable history of hypercoagulable state. I look that her medical history and didn't see any evidence for hypercoagulable abnormality. The patient has a daughter with hypercoagulable abnormality. The patient was seen by Dr. Cherie Dark at [facility] for second opinion and he did not recommend for her to continue on anticoagulation. She has been on Xarelto intermittently when she is travelling for long distance or not ambulating regularly. She denied having any significant complaints today. She denied having any chest pain, shortness breath, cough or hemoptysis. The patient denied having any weight loss or night sweats. She is scheduled for thoracic aorta aneurysm repair by Dr. Laneta Simmers soon. The patient is also followed by Dr. Posey Rea for her routine medical care and screening. She missed her appointment for the mammogram recently and I strongly encouraged her to reschedule it.  MEDICAL HISTORY: Past Medical History  Diagnosis Date  . Fibromyalgia   . Pulmonary embolism   . RA (rheumatoid arthritis)   . Vitamin B 12 deficiency   . Vitamin D deficiency   . Osteoarthritis   . Osteopenia   . Adrenal insufficiency   . IBS (irritable bowel syndrome)   . History of blood clots 2008    below knee  . Diabetes mellitus type II     pt denies this 03-05-11  .  Esophagitis   . Depression   . GERD (gastroesophageal reflux disease)   . Connective tissue disorder   . Cataracts, bilateral     more in left than right  . Osteoarthritis of left shoulder 12/07/2011    ALLERGIES:  is allergic to codeine; pramipexole dihydrochloride; rofecoxib; arava; clonazepam; diclofenac sodium; venlafaxine; alprazolam; benzodiazepines; darvocet; duloxetine; gabapentin; latex; morphine and related; nortriptyline hcl; penicillins; and prednisone.  MEDICATIONS:  Current Outpatient Prescriptions  Medication Sig Dispense Refill  . esomeprazole (NEXIUM) 40 MG capsule Take 40 mg by mouth as needed.      . fluconazole (DIFLUCAN) 150 MG tablet Take 150 mg by mouth once. PRN ONLY with ABX      . hyoscyamine (LEVSIN SL) 0.125 MG SL tablet Place 0.125-0.25 mg under the tongue as needed.      Marland Kitchen oxyCODONE (OXYCONTIN) 10 MG 12 hr tablet Take 1 tablet (10 mg total) by mouth every 12 (twelve) hours as needed for pain. Please fill on 09/16/12  60 tablet  0  . PRESCRIPTION MEDICATION Sympony, infusion as direct      . Rivaroxaban (XARELTO) 20 MG TABS Take 1 tablet (20 mg total) by mouth daily.  100 tablet  3  . rOPINIRole (REQUIP) 2 MG tablet TAKE ONE TABLET BY MOUTH THREE TIMES DAILY  90 tablet  0  . triazolam (HALCION) 0.25 MG tablet Take 1 tablet (0.25 mg total) by mouth at bedtime as needed.  30 tablet  5  . VAGIFEM 10 MCG TABS INSERT ONE TABLET VAGINALLY EVERY 2 (TWO)  DAYS  15 tablet  1   No current facility-administered medications for this visit.    SURGICAL HISTORY:  Past Surgical History  Procedure Laterality Date  . Abdominal hysterectomy    . Total knee arthroplasty      bilateral  . Cholecystectomy  2003  . Tonsillectomy  1941  . Appendectomy  1995  . Wrist surgery      bilateral  . Bilateral oophorectomy    . Tummy tuck    . Cosmetic surgery    . Total shoulder arthroplasty  12/07/2011    Procedure: TOTAL SHOULDER ARTHROPLASTY;  Surgeon: Eulas Post, MD;   Location: [facility] OR;  Service: Orthopedics;  Laterality: Left;  left total shoulder artthroplasty    REVIEW OF SYSTEMS:  A comprehensive review of systems was negative.   PHYSICAL EXAMINATION: General appearance: alert, cooperative and no distress Head: Normocephalic, without obvious abnormality, atraumatic Neck: no adenopathy Lymph nodes: Cervical, supraclavicular, and axillary nodes normal. Resp: clear to auscultation bilaterally Cardio: regular rate and rhythm, S1, S2 normal, no murmur, click, rub or gallop GI: soft, non-tender; bowel sounds normal; no masses,  no organomegaly Extremities: extremities normal, atraumatic, no cyanosis or edema  ECOG PERFORMANCE STATUS: 1 - Symptomatic but completely ambulatory  Blood pressure 115/61, pulse 72, temperature 97.5 F (36.4 C), temperature source Oral, resp. rate 18, height 5\' 2"  (1.575 m), weight 164 lb 3.2 oz (74.481 kg).  LABORATORY DATA: Lab Results  Component Value Date   WBC 3.8* 09/13/2012   HGB 12.7 09/13/2012   HCT 38.8 09/13/2012   MCV 91.2 09/13/2012   PLT 136* 09/13/2012      Chemistry      Component Value Date/Time   NA 140 09/13/2012 1427   NA 139 09/13/2012 1200   K 4.2 09/13/2012 1427   K 4.6 09/13/2012 1200   CL 105 09/13/2012 1427   CL 104 09/13/2012 1200   CO2 27 09/13/2012 1427   CO2 29 09/13/2012 1200   BUN 25.9 09/13/2012 1427   BUN 25* 09/13/2012 1200   CREATININE 1.2* 09/13/2012 1427   CREATININE 1.2 09/13/2012 1200      Component Value Date/Time   CALCIUM 9.4 09/13/2012 1427   CALCIUM 9.5 09/13/2012 1200   ALKPHOS 78 09/13/2012 1427   ALKPHOS 69 12/01/2011 1148   AST 18 09/13/2012 1427   AST 15 12/01/2011 1148   ALT 12 09/13/2012 1427   ALT 11 12/01/2011 1148   BILITOT 0.40 09/13/2012 1427   BILITOT 0.4 12/01/2011 1148       RADIOGRAPHIC STUDIES: Ct Angio Chest Aorta W/cm &/or Wo/cm  08/24/2012  **ADDENDUM** CREATED: 08/24/2012 13:11:59  IMA is diminutive and patent.  Branch vessels are grossly patent.  **END  ADDENDUM** SIGNED BY: Marlowe Aschoff. Hoss, M.D.   08/24/2012  *RADIOLOGY REPORT*  Clinical Data:  Thoracic aortic aneurysm.  Pre stent graft.  CT ANGIOGRAPHY CHEST, ABDOMEN AND PELVIS  Technique:  Multidetector CT imaging through the chest, abdomen and pelvis was performed using the standard protocol during bolus administration of intravenous contrast.  Multiplanar reconstructed images including MIPs were obtained and reviewed to evaluate the vascular anatomy.  Contrast: , OMNIPAQUE IOHEXOL 350 MG/ML SOLN  Comparison:  07/07/2007 and multiple other studies.  CTA CHEST  Findings:  Aneurysmal dilatation of the ascending aorta has worsened.  Maximal diameters of the ascending aorta the at the sinus of Valsalva, sinotubular junction, and ascending aorta are 3.2 cm, 3.3 cm, and 5.8 cm respectively.  There is slight kinking  of the thoracic aorta at the junction between the arch and proximal descending aorta without significant obstruction.  Innominate artery, right subclavian artery, right common carotid artery, left common carotid artery the, and left subclavian artery are patent.  All of these vessels are tortuous.  Proximal left subclavian artery is ectatic with maximal diameter of 15 mm. Innominate artery is minimally ectatic at 13 mm.  Right vertebral artery is grossly patent.  Left vertebral artery is also grossly patent.  Descending aorta is nonaneurysmal and patent without significant plaque.  It is tortuous.  No obvious filling defects in the pulmonary arterial tree to suggest acute pulmonary thromboembolism.  Borderline enlarged right paratracheal node on image 30 measures 10 mm in short axis diameter.  This has improved.  No pericardial effusion.  No pneumothorax or pleural effusion.  Minimal ground-glass in the posterior upper left upper lobe on image 24 is stable.  Left shoulder arthroplasty is in place.  No definite acute rib fracture.  Stable T3 compression deformity.  Stable L1 compression compared with  lateral radiographs from Sep 10, 2011.  Unremarkable thyroid gland.   Review of the MIP images confirms the above findings.  IMPRESSION: Aneurysmal dilatation of the ascending aorta has increased from 4.5 cm to 5.8 cm as described.  Chronic findings are noted.  CTA ABDOMEN AND PELVIS  Findings:  The aorta is nonaneurysmal and patent with mild diffuse atherosclerotic calcifications.  Celiac and SMA are widely patent. Branch vessels of the celiac and SMA are grossly patent.  Expected branching anatomy is noted.  Renal arteries are patent. There is some irregularity of the mid right renal artery with a lobulated appearance.  Early fibromuscular dysplasia is not excluded.  Common, internal, and external iliac arteries are all widely patent with mild tortuosity and mild vascular calcifications.  Venous phase images demonstrate patency of the portal, superior mesenteric, and splenic veins.  IVC and renal veins are patent. Right gonadal vein is prominent.  Left gonadal vein is prominent. No obvious venous reflux however.  Post cholecystectomy.  Diffuse hepatic steatosis.  No focal liver mass.  Spleen, pancreas, adrenal glands are within normal limits.  Chronic changes of the kidneys.  Tiny 3 mm angiomyolipoma in the lower pole of the left kidney.  See image 19 of series 7.  Uterus is absent.  Bladder and adnexa are within normal limits.  Chronic changes at the pubic symphysis.  No acute bony deformity. Advanced degenerative disc disease at L5-S1 are noted. Anterolisthesis T12 upon L1 and L4 upon L5 have a chronic appearance.  There is associated facet arthropathy at these levels.   Review of the MIP images confirms the above findings.  IMPRESSION: No evidence of aortic aneurysm.  The right renal artery is somewhat lobulated.  Early fibromuscular dysplasia is not excluded.  Correlate with a history of uncontrolled hypertension.  Tiny angiomyolipoma in the left kidney.  Diffuse hepatic steatosis.   Original Report  Authenticated By: Jolaine Click, M.D.    Ct Angio Abd/pel W/ And/or W/o  08/24/2012  **ADDENDUM** CREATED: 08/24/2012 13:11:59  IMA is diminutive and patent.  Branch vessels are grossly patent.  **END ADDENDUM** SIGNED BY: Marlowe Aschoff. Hoss, M.D.   08/24/2012  *RADIOLOGY REPORT*  Clinical Data:  Thoracic aortic aneurysm.  Pre stent graft.  CT ANGIOGRAPHY CHEST, ABDOMEN AND PELVIS  Technique:  Multidetector CT imaging through the chest, abdomen and pelvis was performed using the standard protocol during bolus administration of intravenous contrast.  Multiplanar reconstructed images including MIPs  were obtained and reviewed to evaluate the vascular anatomy.  Contrast: , OMNIPAQUE IOHEXOL 350 MG/ML SOLN  Comparison:  07/07/2007 and multiple other studies.  CTA CHEST  Findings:  Aneurysmal dilatation of the ascending aorta has worsened.  Maximal diameters of the ascending aorta the at the sinus of Valsalva, sinotubular junction, and ascending aorta are 3.2 cm, 3.3 cm, and 5.8 cm respectively.  There is slight kinking of the thoracic aorta at the junction between the arch and proximal descending aorta without significant obstruction.  Innominate artery, right subclavian artery, right common carotid artery, left common carotid artery the, and left subclavian artery are patent.  All of these vessels are tortuous.  Proximal left subclavian artery is ectatic with maximal diameter of 15 mm. Innominate artery is minimally ectatic at 13 mm.  Right vertebral artery is grossly patent.  Left vertebral artery is also grossly patent.  Descending aorta is nonaneurysmal and patent without significant plaque.  It is tortuous.  No obvious filling defects in the pulmonary arterial tree to suggest acute pulmonary thromboembolism.  Borderline enlarged right paratracheal node on image 30 measures 10 mm in short axis diameter.  This has improved.  No pericardial effusion.  No pneumothorax or pleural effusion.  Minimal ground-glass in the  posterior upper left upper lobe on image 24 is stable.  Left shoulder arthroplasty is in place.  No definite acute rib fracture.  Stable T3 compression deformity.  Stable L1 compression compared with lateral radiographs from Sep 10, 2011.  Unremarkable thyroid gland.   Review of the MIP images confirms the above findings.  IMPRESSION: Aneurysmal dilatation of the ascending aorta has increased from 4.5 cm to 5.8 cm as described.  Chronic findings are noted.  CTA ABDOMEN AND PELVIS  Findings:  The aorta is nonaneurysmal and patent with mild diffuse atherosclerotic calcifications.  Celiac and SMA are widely patent. Branch vessels of the celiac and SMA are grossly patent.  Expected branching anatomy is noted.  Renal arteries are patent. There is some irregularity of the mid right renal artery with a lobulated appearance.  Early fibromuscular dysplasia is not excluded.  Common, internal, and external iliac arteries are all widely patent with mild tortuosity and mild vascular calcifications.  Venous phase images demonstrate patency of the portal, superior mesenteric, and splenic veins.  IVC and renal veins are patent. Right gonadal vein is prominent.  Left gonadal vein is prominent. No obvious venous reflux however.  Post cholecystectomy.  Diffuse hepatic steatosis.  No focal liver mass.  Spleen, pancreas, adrenal glands are within normal limits.  Chronic changes of the kidneys.  Tiny 3 mm angiomyolipoma in the lower pole of the left kidney.  See image 19 of series 7.  Uterus is absent.  Bladder and adnexa are within normal limits.  Chronic changes at the pubic symphysis.  No acute bony deformity. Advanced degenerative disc disease at L5-S1 are noted. Anterolisthesis T12 upon L1 and L4 upon L5 have a chronic appearance.  There is associated facet arthropathy at these levels.   Review of the MIP images confirms the above findings.  IMPRESSION: No evidence of aortic aneurysm.  The right renal artery is somewhat lobulated.   Early fibromuscular dysplasia is not excluded.  Correlate with a history of uncontrolled hypertension.  Tiny angiomyolipoma in the left kidney.  Diffuse hepatic steatosis.   Original Report Authenticated By: Jolaine Click, M.D.     ASSESSMENT: This is a very pleasant 78 years old white female with a family history of  hypercoagulable state but the patient herself does not have any hypercoagulable abnormality.  PLAN: I have a lengthy discussion with the patient today about her condition. I don't see any need for the patient to be on routine anticoagulation. I recommended for her to continue her routine followup visit with her primary care physician. I strongly encouraged the patient to reschedule her mammogram and also to keep up with all the required screening as ordered by her primary care physician. I don't see a need for Diane Abbott to continue routine followup visit with me at this point but will be happy to see her in the future if needed. The patient agreed to the current plan. All questions were answered. The patient knows to call the clinic with any problems, questions or concerns. We can certainly see the patient much sooner if necessary.  I spent 20 minutes counseling the patient face to face. The total time spent in the appointment was 30 minutes.

## 2012-09-14 ENCOUNTER — Telehealth: Payer: Self-pay | Admitting: *Deleted

## 2012-09-14 DIAGNOSIS — I712 Thoracic aortic aneurysm, without rupture: Secondary | ICD-10-CM | POA: Insufficient documentation

## 2012-09-14 DIAGNOSIS — R079 Chest pain, unspecified: Secondary | ICD-10-CM

## 2012-09-14 DIAGNOSIS — I351 Nonrheumatic aortic (valve) insufficiency: Secondary | ICD-10-CM | POA: Insufficient documentation

## 2012-09-14 DIAGNOSIS — Z01810 Encounter for preprocedural cardiovascular examination: Secondary | ICD-10-CM

## 2012-09-14 NOTE — Progress Notes (Signed)
Patient ID: Diane Abbott, female   DOB: 06/11/1934, 78 y.o.   MRN: [id number] PCP: Dr. Plotnikov  78 yo with history of OA, RA, PE/DVT, and recently diagnosed ascending aortic aneurysm presents for cardiology evaluation.  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  She has been having problems with C-spine arthritis.  She had an MRI of her neck done, which noted ascending aortic aneurysm.  Therefore, CTA chest was done.  This showed a 5.8 cm ascending aortic aneurysm extending to the proximal arch.  Echo showed moderate AI with trileaflet aortic valve.    Symptomatically, patient has not had significant cardiac complaints.   She is chronically short of breath when she walks briskly up a flight of steps.  No PND or orthopnea.  Sometimes she will get mild chest aching, but this is usually while she is lying down at rest rather than exertional.    ECG: NSR, normal  Labs (4/14): K 4.4, creatinine 1.3  PMH: 1. C-spine OA 2. Left shoulder replacement in 8/13.  3. Bilateral TKR.  4. Depression 5. Anxiety 6. Fibromyalgia 7. Rheumatoid arthritis.  She is getting Sympony injections.  8. 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  Last event was in 2008.  She has been on Xarelto chronically.   9. GERD 10. Vitamin B12 deficiency.  11. Cholecystectomy 12. Abdominal hysterectomy 13. Ascending aortic aneurysm: 4.5 cm by CT in 2009.  5.8 cm by CTA chest in 4/14.  14. Aortic insufficiency: Echo (4/14) with EF 60-65%, moderate AI, trileaflet aortic valve that incompletely coapts.   SH: Lives in [place], nonsmoker, occasional ETOH.   FH: Familial hypercoagulable state.  Grandmother with "heart problem."   ROS: All systems reviewed and negative except as per HPI.   Current Outpatient Prescriptions  Medication Sig Dispense Refill  . esomeprazole (NEXIUM) 40 MG capsule Take 40 mg by mouth as needed.      . fluconazole (DIFLUCAN) 150  MG tablet Take 150 mg by mouth once. PRN ONLY with ABX      . hyoscyamine (LEVSIN SL) 0.125 MG SL tablet Place 0.125-0.25 mg under the tongue as needed.      . oxyCODONE (OXYCONTIN) 10 MG 12 hr tablet Take 1 tablet (10 mg total) by mouth every 12 (twelve) hours as needed for pain. Please fill on 09/16/12  60 tablet  0  . PRESCRIPTION MEDICATION Sympony, infusion as direct      . Rivaroxaban (XARELTO) 20 MG TABS Take 1 tablet (20 mg total) by mouth daily.  100 tablet  3  . rOPINIRole (REQUIP) 2 MG tablet TAKE ONE TABLET BY MOUTH THREE TIMES DAILY  90 tablet  0  . triazolam (HALCION) 0.25 MG tablet Take 1 tablet (0.25 mg total) by mouth at bedtime as needed.  30 tablet  5  . VAGIFEM 10 MCG TABS INSERT ONE TABLET VAGINALLY EVERY 2 (TWO) DAYS  15 tablet  1   No current facility-administered medications for this visit.   BP 132/72  Pulse 82  Ht 5' 2" (1.575 m)  Wt 164 lb (74.39 kg)  BMI 29.99 kg/m2 General: NAD Neck: JVP 7 cm, no thyromegaly or thyroid nodule.  Lungs: Clear to auscultation bilaterally with normal respiratory effort. CV: Nondisplaced PMI.  Heart regular S1/S2, no S3/S4, 2/6 SEM and 1/6 diastolic murmur along the upper sternal border.  No peripheral edema.  No carotid bruit.  Normal pedal pulses.  Abdomen: Soft, nontender, no hepatosplenomegaly, no   distention.  Skin: Intact without lesions or rashes.  Neurologic: Alert and oriented x 3.  Psych: Normal affect. Extremities: No clubbing or cyanosis.  HEENT: Normal.   Assessment/Plan: 1. Ascending aortic aneurysm: Reaching 5.8 cm, extending to the proximal arch.  The root at the sinuses of valsalva measured 3.2 cm.  Etiology is uncertain: she does not have a bicuspid valve and she does not have a history of HTN.  The size of the aneurysm is beyond the threshold for elective repair.  She will need right and left heart catheterization prior to surgery, which I will arrange (this week).  She has been off Xarelto for several days now.    2. Aortic insufficiency: The aortic valve is trileaflet.  It looks structurally normal but suspect there has been annular dilatation leading to incomplete coaptation in the center with moderate central AI.  The aortic valve will need to be addressed at the time of surgery: replacement versus repair and reimplantation . 3. History of venous thromboembolism: Unless surgery will be within a week post-cath, I will restart Xarelto.   Fonda Rochon 09/14/2012   

## 2012-09-14 NOTE — Telephone Encounter (Signed)
Left pt  a message this time at her home phone, regarding pt needing INR drown. Pt to come in the afternoon or tomorrow some time after 7:30 AM. Order for labs and appointment are in the system.

## 2012-09-14 NOTE — Telephone Encounter (Signed)
[name] from the [facility] called because pt needs an INR Blood work. I Called pt and left a message to call back.

## 2012-09-15 ENCOUNTER — Telehealth: Payer: Self-pay | Admitting: Physician Assistant

## 2012-09-15 ENCOUNTER — Encounter (HOSPITAL_BASED_OUTPATIENT_CLINIC_OR_DEPARTMENT_OTHER)
Admission: RE | Disposition: A | Discharge status: Home / Self Care | Payer: Self-pay | Source: Ambulatory Visit | Attending: Cardiology

## 2012-09-15 ENCOUNTER — Other Ambulatory Visit (INDEPENDENT_AMBULATORY_CARE_PROVIDER_SITE_OTHER): Payer: Medicare Other

## 2012-09-15 ENCOUNTER — Inpatient Hospital Stay (HOSPITAL_BASED_OUTPATIENT_CLINIC_OR_DEPARTMENT_OTHER)
Admission: RE | Admit: 2012-09-15 | Discharge: 2012-09-15 | Disposition: A | Discharge status: Home / Self Care | Payer: Medicare Other | Source: Ambulatory Visit | Attending: Cardiology | Admitting: Cardiology

## 2012-09-15 DIAGNOSIS — Z01810 Encounter for preprocedural cardiovascular examination: Secondary | ICD-10-CM

## 2012-09-15 DIAGNOSIS — I712 Thoracic aortic aneurysm, without rupture, unspecified: Secondary | ICD-10-CM | POA: Diagnosis not present

## 2012-09-15 DIAGNOSIS — R0602 Shortness of breath: Secondary | ICD-10-CM | POA: Diagnosis not present

## 2012-09-15 DIAGNOSIS — I359 Nonrheumatic aortic valve disorder, unspecified: Secondary | ICD-10-CM | POA: Insufficient documentation

## 2012-09-15 DIAGNOSIS — M069 Rheumatoid arthritis, unspecified: Secondary | ICD-10-CM | POA: Diagnosis not present

## 2012-09-15 DIAGNOSIS — R079 Chest pain, unspecified: Secondary | ICD-10-CM

## 2012-09-15 DIAGNOSIS — Z86718 Personal history of other venous thrombosis and embolism: Secondary | ICD-10-CM | POA: Insufficient documentation

## 2012-09-15 DIAGNOSIS — Z7901 Long term (current) use of anticoagulants: Secondary | ICD-10-CM | POA: Insufficient documentation

## 2012-09-15 HISTORY — PX: CARDIAC CATHETERIZATION: SHX172

## 2012-09-15 LAB — POCT I-STAT 3, VENOUS BLOOD GAS (G3P V)
pCO2, Ven: 48.4 mmHg (ref 45.0–50.0)
pH, Ven: 7.327 — ABNORMAL HIGH (ref 7.250–7.300)

## 2012-09-15 LAB — POCT I-STAT 3, ART BLOOD GAS (G3+)
Bicarbonate: 26.3 mEq/L — ABNORMAL HIGH (ref 20.0–24.0)
TCO2: 28 mmol/L (ref 0–100)

## 2012-09-15 SURGERY — JV LEFT AND RIGHT HEART CATHETERIZATION WITH CORONARY ANGIOGRAM
Anesthesia: Moderate Sedation

## 2012-09-15 MED ORDER — SODIUM CHLORIDE 0.9 % IV SOLN: INTRAVENOUS | Status: AC

## 2012-09-15 MED ORDER — ACETAMINOPHEN 325 MG PO TABS: 650.0000 mg | ORAL_TABLET | ORAL | Status: DC | PRN

## 2012-09-15 MED ORDER — ONDANSETRON HCL 4 MG/2ML IJ SOLN: 4.0000 mg | Freq: Four times a day (QID) | INTRAMUSCULAR | Status: DC | PRN

## 2012-09-15 NOTE — Telephone Encounter (Signed)
Per Dayna PA, pt is coming for labs (INR) this AM.

## 2012-09-15 NOTE — Telephone Encounter (Signed)
Patient's daughter called regarding confusion over a voicemail her mother received. Diane Abbott is scheduled for a cardiac cath in [facility] today at 9:30. She received a voicemail last night on her phone about coming in for labwork. Our office left this phone note in her chart: "Left pt a message this time at her home phone, regarding pt needing INR drown. Pt to come in the afternoon or tomorrow some time after 7:30 AM. Order for labs and appointment are in the system." It is unclear to me if this meant for the INR needing to be down or drawn. I spoke with [facility] for clarification since she is due to be in the lab this morning - per their instructions, she should proceed to the office to have the INR drawn and then onward to the [facility]. Pt's daughter made aware. She verbalized understanding. Dayna Dunn PA-C

## 2012-09-15 NOTE — Interval H&P Note (Signed)
History and Physical Interval Note:  09/15/2012 10:54 AM  Diane Abbott  has presented today for surgery, with the diagnosis of surgical clearance  The various methods of treatment have been discussed with the patient and family. After consideration of risks, benefits and other options for treatment, the patient has consented to  Procedure(s): [facility] LEFT AND RIGHT HEART CATHETERIZATION WITH CORONARY ANGIOGRAM (N/A) as a surgical intervention .  The patient's history has been reviewed, patient examined, no change in status, stable for surgery.  I have reviewed the patient's chart and labs.  Questions were answered to the patient's satisfaction.     Dafne Nield [employer]

## 2012-09-15 NOTE — OR Nursing (Signed)
Head elevated to eat.

## 2012-09-15 NOTE — OR Nursing (Signed)
Bedrest began at 1150

## 2012-09-15 NOTE — CV Procedure (Signed)
   Cardiac Catheterization Procedure Note  Name: Diane Abbott MRN: [id number] DOB: 1934-07-09  Procedure: Right Heart Cath, Left Heart Cath, Selective Coronary Angiography, aortic root angiography  Indication: Ascending aortic aneurysm, aortic insufficiency, pre-surgery.    Procedural Details: The right groin was prepped, draped, and anesthetized with 1% lidocaine. Using the modified Seldinger technique a 4 French sheath was placed in the right femoral artery and a 7 French sheath was placed in the right femoral vein. A Swan-Ganz catheter was used for the right heart catheterization. Standard protocol was followed for recording of right heart pressures and sampling of oxygen saturations. Fick cardiac output was calculated. JL-6, 3DRC, and pigtail catheter were used for selective coronary angiography and aortic root angiography. There were no immediate procedural complications. The patient was transferred to the post catheterization recovery area for further monitoring.  Procedural Findings: Hemodynamics (mmHg) RA mean 3 RV 26/4 PA 21/15 PCWP mean 9 LV 130/12 AO 124/80  Oxygen saturations: PA 69% AO 90%  Cardiac Output (Fick) 6.1  Cardiac Index (Fick) 3.4   Coronary angiography: Coronary dominance: right  Left mainstem: No significant disease.   Left anterior descending (LAD): Mild luminal irregularities.   Left circumflex (LCx): No significant disease.  There is a small to moderate ramus and a large PLOM.   Right coronary artery (RCA): No significant disease.   Aortic root angiography: 3+ aortic insufficiency.  Aneurysmal dilatation from the sinotubular junction to the proximal aortic arch.   Final Conclusions:  No significant CAD.  3+ aortic insufficiency.   Recommendations: Surgery per Dr. Laneta Simmers.  Will need to check on timing, if surgery will be delayed a significant amount of time would restart Xarelto.   Marca Ancona 09/15/2012    Marca Ancona 09/15/2012,  11:29 AM

## 2012-09-15 NOTE — H&P (View-Only) (Signed)
Patient ID: Diane Abbott, female   DOB: 10/04/1934, 78 y.o.   MRN: [id number] PCP: Dr. Posey Rea  78 yo with history of OA, RA, PE/DVT, and recently diagnosed ascending aortic aneurysm presents for cardiology evaluation.  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  She has been having problems with C-spine arthritis.  She had an MRI of her neck done, which noted ascending aortic aneurysm.  Therefore, CTA chest was done.  This showed a 5.8 cm ascending aortic aneurysm extending to the proximal arch.  Echo showed moderate AI with trileaflet aortic valve.    Symptomatically, patient has not had significant cardiac complaints.   She is chronically short of breath when she walks briskly up a flight of steps.  No PND or orthopnea.  Sometimes she will get mild chest aching, but this is usually while she is lying down at rest rather than exertional.    ECG: NSR, normal  Labs (4/14): K 4.4, creatinine 1.3  PMH: 1. C-spine OA 2. Left shoulder replacement in 8/13.  3. Bilateral TKR.  4. Depression 5. Anxiety 6. Fibromyalgia 7. Rheumatoid arthritis.  She is getting Sympony injections.  8. 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  Last event was in 2008.  She has been on Xarelto chronically.   9. GERD 10. Vitamin B12 deficiency.  11. Cholecystectomy 12. Abdominal hysterectomy 13. Ascending aortic aneurysm: 4.5 cm by CT in 2009.  5.8 cm by CTA chest in 4/14.  14. Aortic insufficiency: Echo (4/14) with EF 60-65%, moderate AI, trileaflet aortic valve that incompletely coapts.   SH: Lives in [place], nonsmoker, occasional ETOH.   FH: Familial hypercoagulable state.  Grandmother with "heart problem."   ROS: All systems reviewed and negative except as per HPI.   Current Outpatient Prescriptions  Medication Sig Dispense Refill  . esomeprazole (NEXIUM) 40 MG capsule Take 40 mg by mouth as needed.      . fluconazole (DIFLUCAN) 150  MG tablet Take 150 mg by mouth once. PRN ONLY with ABX      . hyoscyamine (LEVSIN SL) 0.125 MG SL tablet Place 0.125-0.25 mg under the tongue as needed.      Marland Kitchen oxyCODONE (OXYCONTIN) 10 MG 12 hr tablet Take 1 tablet (10 mg total) by mouth every 12 (twelve) hours as needed for pain. Please fill on 09/16/12  60 tablet  0  . PRESCRIPTION MEDICATION Sympony, infusion as direct      . Rivaroxaban (XARELTO) 20 MG TABS Take 1 tablet (20 mg total) by mouth daily.  100 tablet  3  . rOPINIRole (REQUIP) 2 MG tablet TAKE ONE TABLET BY MOUTH THREE TIMES DAILY  90 tablet  0  . triazolam (HALCION) 0.25 MG tablet Take 1 tablet (0.25 mg total) by mouth at bedtime as needed.  30 tablet  5  . VAGIFEM 10 MCG TABS INSERT ONE TABLET VAGINALLY EVERY 2 (TWO) DAYS  15 tablet  1   No current facility-administered medications for this visit.   BP 132/72  Pulse 82  Ht 5\' 2"  (1.575 m)  Wt 164 lb (74.39 kg)  BMI 29.99 kg/m2 General: NAD Neck: JVP 7 cm, no thyromegaly or thyroid nodule.  Lungs: Clear to auscultation bilaterally with normal respiratory effort. CV: Nondisplaced PMI.  Heart regular S1/S2, no S3/S4, 2/6 SEM and 1/6 diastolic murmur along the upper sternal border.  No peripheral edema.  No carotid bruit.  Normal pedal pulses.  Abdomen: Soft, nontender, no hepatosplenomegaly, no  distention.  Skin: Intact without lesions or rashes.  Neurologic: Alert and oriented x 3.  Psych: Normal affect. Extremities: No clubbing or cyanosis.  HEENT: Normal.   Assessment/Plan: 1. Ascending aortic aneurysm: Reaching 5.8 cm, extending to the proximal arch.  The root at the sinuses of valsalva measured 3.2 cm.  Etiology is uncertain: she does not have a bicuspid valve and she does not have a history of HTN.  The size of the aneurysm is beyond the threshold for elective repair.  She will need right and left heart catheterization prior to surgery, which I will arrange (this week).  She has been off Xarelto for several days now.    2. Aortic insufficiency: The aortic valve is trileaflet.  It looks structurally normal but suspect there has been annular dilatation leading to incomplete coaptation in the center with moderate central AI.  The aortic valve will need to be addressed at the time of surgery: replacement versus repair and reimplantation . 3. History of venous thromboembolism: Unless surgery will be within a week post-cath, I will restart Xarelto.   Marca Ancona 09/14/2012

## 2012-09-19 ENCOUNTER — Encounter: Payer: Self-pay | Admitting: Internal Medicine

## 2012-09-19 ENCOUNTER — Ambulatory Visit (INDEPENDENT_AMBULATORY_CARE_PROVIDER_SITE_OTHER): Payer: Medicare Other | Admitting: Internal Medicine

## 2012-09-19 VITALS — BP 140/62 | HR 68 | Temp 97.3°F | Resp 16 | Wt 163.0 lb

## 2012-09-19 DIAGNOSIS — I712 Thoracic aortic aneurysm, without rupture, unspecified: Secondary | ICD-10-CM | POA: Diagnosis not present

## 2012-09-19 DIAGNOSIS — I351 Nonrheumatic aortic (valve) insufficiency: Secondary | ICD-10-CM

## 2012-09-19 DIAGNOSIS — I359 Nonrheumatic aortic valve disorder, unspecified: Secondary | ICD-10-CM | POA: Diagnosis not present

## 2012-09-19 DIAGNOSIS — M359 Systemic involvement of connective tissue, unspecified: Secondary | ICD-10-CM | POA: Diagnosis not present

## 2012-09-19 DIAGNOSIS — E538 Deficiency of other specified B group vitamins: Secondary | ICD-10-CM | POA: Diagnosis not present

## 2012-09-19 DIAGNOSIS — I2699 Other pulmonary embolism without acute cor pulmonale: Secondary | ICD-10-CM

## 2012-09-19 DIAGNOSIS — IMO0001 Reserved for inherently not codable concepts without codable children: Secondary | ICD-10-CM

## 2012-09-19 MED ORDER — OXYCODONE HCL 10 MG PO TB12: 10.0000 mg | ORAL_TABLET | Freq: Two times a day (BID) | ORAL | Status: DC | PRN

## 2012-09-19 NOTE — Assessment & Plan Note (Signed)
2014 Coronary angiography:  Coronary dominance: right  Left mainstem: No significant disease.  Left anterior descending (LAD): Mild luminal irregularities.  Left circumflex (LCx): No significant disease. There is a small to moderate ramus and a large PLOM.  Right coronary artery (RCA): No significant disease.  Aortic root angiography: 3+ aortic insufficiency. Aneurysmal dilatation from the sinotubular junction to the proximal aortic arch.  Final Conclusions: No significant CAD. 3+ aortic insufficiency.   Surg by Dr Bartle on 09/29/12 

## 2012-09-19 NOTE — Assessment & Plan Note (Signed)
Dr Shirline Frees - no need for anticoagulation on permanent basis

## 2012-09-19 NOTE — Assessment & Plan Note (Signed)
Continue with current prescription therapy as reflected on the Med list.  

## 2012-09-19 NOTE — Assessment & Plan Note (Signed)
2014 Coronary angiography:  Coronary dominance: right  Left mainstem: No significant disease.  Left anterior descending (LAD): Mild luminal irregularities.  Left circumflex (LCx): No significant disease. There is a small to moderate ramus and a large PLOM.  Right coronary artery (RCA): No significant disease.  Aortic root angiography: 3+ aortic insufficiency. Aneurysmal dilatation from the sinotubular junction to the proximal aortic arch.  Final Conclusions: No significant CAD. 3+ aortic insufficiency.   Surg by Dr Laneta Simmers on 09/29/12

## 2012-09-19 NOTE — Progress Notes (Signed)
Subjective:     HPI  F/u R CTS  and neck pain - Dr Jeral Fruit is planning to do surgeries: she had a myelogram   C/o thor AA and AR recently diagnosed. AVR is planned. She had a heart cath  S/p L shoulder replacement 12/07/11 by Dr Dion Saucier. F/u pain in L arm - better   The patient is here to follow up on chronic depression, anxiety, headaches and chronic moderate fibromyalgia symptoms controlled with medicines, stable. Xarelto - we switched her to it due to Lovenox cost a few months ago. Xarelto is $400/mo.Marland KitchenMarland KitchenShe is on Sympony for RA q 2 mo  Wt Readings from Last 3 Encounters:  09/19/12 163 lb (73.936 kg)  09/15/12 164 lb (74.39 kg)  09/15/12 164 lb (74.39 kg)   BP Readings from Last 3 Encounters:  09/19/12 140/62  09/15/12 116/63  09/15/12 116/63   Past Medical History  Diagnosis Date  . Fibromyalgia   . Pulmonary embolism   . RA (rheumatoid arthritis)   . Vitamin B 12 deficiency   . Vitamin D deficiency   . Osteoarthritis   . Osteopenia   . Adrenal insufficiency   . IBS (irritable bowel syndrome)   . History of blood clots 2008    below knee  . Diabetes mellitus type II     pt denies this 03-05-11  . Esophagitis   . Depression   . GERD (gastroesophageal reflux disease)   . Connective tissue disorder   . Cataracts, bilateral     more in left than right  . Osteoarthritis of left shoulder 12/07/2011   Past Surgical History  Procedure Laterality Date  . Abdominal hysterectomy    . Total knee arthroplasty      bilateral  . Cholecystectomy  2003  . Tonsillectomy  1941  . Appendectomy  1995  . Wrist surgery      bilateral  . Bilateral oophorectomy    . Tummy tuck    . Cosmetic surgery    . Total shoulder arthroplasty  12/07/2011    Procedure: TOTAL SHOULDER ARTHROPLASTY;  Surgeon: Eulas Post, MD;  Location: [facility] OR;  Service: Orthopedics;  Laterality: Left;  left total shoulder artthroplasty    reports that she has never smoked. She has never used smokeless  tobacco. She reports that  drinks alcohol. She reports that she does not use illicit drugs. family history includes Breast cancer in an unspecified family member; COPD in her father; Clotting disorder in an unspecified family member; Diabetes in her mother; Heart disease in her maternal grandmother; Ovarian cancer in her mother; and Pancreatic cancer in an unspecified family member.  There is no history of Colon cancer. Allergies  Allergen Reactions  . Codeine Other (See Comments)    Blood pressure drops; "I pass out."  . Pramipexole Dihydrochloride Nausea Only    sick, falling  . Rofecoxib Other (See Comments)    "sleep walks"  . Arava (Leflunomide) Swelling  . Clonazepam   . Diclofenac Sodium   . Venlafaxine   . Alprazolam Other (See Comments)    "Makes me mean"  . Benzodiazepines Other (See Comments)    "Halllucinations?"  . Darvocet (Propoxyphene-Acetaminophen) Other (See Comments)    "makes my eyes ache"  . Duloxetine Other (See Comments)    achy legs  . Gabapentin Other (See Comments)    headache  . Latex Itching and Dermatitis    When examined with latex gloves, burns and itches in contact areas per pt.  Marland Kitchen  Morphine And Related Other (See Comments)    Hallucinations   . Nortriptyline Hcl Other (See Comments)    Headache   . Penicillins Rash    Can take Cephalosporins  . Prednisone Swelling    Just doesn't want to take it.   Current Outpatient Prescriptions on File Prior to Visit  Medication Sig Dispense Refill  . esomeprazole (NEXIUM) 40 MG capsule Take 40 mg by mouth as needed.      . fluconazole (DIFLUCAN) 150 MG tablet Take 150 mg by mouth once. PRN ONLY with ABX      . hyoscyamine (LEVSIN SL) 0.125 MG SL tablet Place 0.125-0.25 mg under the tongue as needed.      Marland Kitchen PRESCRIPTION MEDICATION Sympony, infusion as direct      . rOPINIRole (REQUIP) 2 MG tablet TAKE ONE TABLET BY MOUTH THREE TIMES DAILY  90 tablet  0  . triazolam (HALCION) 0.25 MG tablet Take 1 tablet  (0.25 mg total) by mouth at bedtime as needed.  30 tablet  5  . VAGIFEM 10 MCG TABS INSERT ONE TABLET VAGINALLY EVERY 2 (TWO) DAYS  15 tablet  1   No current facility-administered medications on file prior to visit.      Review of Systems  Constitutional: Negative for activity change, appetite change, fatigue and unexpected weight change.  HENT: Positive for sinus pressure. Negative for congestion and mouth sores.   Eyes: Negative for visual disturbance.  Respiratory: Negative for chest tightness.   Gastrointestinal: Negative for nausea and abdominal pain.  Genitourinary: Negative for frequency, difficulty urinating and vaginal pain.  Musculoskeletal: Positive for back pain and arthralgias. Negative for gait problem.  Skin: Negative for pallor.  Neurological: Negative for dizziness, tremors, weakness and numbness.  Psychiatric/Behavioral: Negative for confusion and sleep disturbance.   BP 140/62  Pulse 68  Temp(Src) 97.3 F (36.3 C) (Oral)  Resp 16  Wt 163 lb (73.936 kg)  BMI 29.81 kg/m2     Objective:   Physical Exam  Constitutional: She appears well-developed and well-nourished. No distress.  HENT:  Head: Normocephalic.  Right Ear: External ear normal.  Left Ear: External ear normal.  Nose: Nose normal.  Mouth/Throat: Oropharynx is clear and moist.  Eyes: Conjunctivae are normal. Pupils are equal, round, and reactive to light. Right eye exhibits no discharge. Left eye exhibits no discharge.  Neck: Normal range of motion. Neck supple. No JVD present. No tracheal deviation present. No thyromegaly present.  Cardiovascular: Normal rate, regular rhythm and normal heart sounds.   Pulmonary/Chest: No stridor. No respiratory distress. She has no wheezes.  Abdominal: Soft. Bowel sounds are normal. She exhibits no distension and no mass. There is no tenderness. There is no rebound and no guarding.  Musculoskeletal: She exhibits tenderness. She exhibits no edema.  L shoulder is not  tender w/ROM Neck is tender w/ROM  Lymphadenopathy:    She has no cervical adenopathy.  Neurological: She displays normal reflexes. No cranial nerve deficit. She exhibits normal muscle tone. Coordination normal.  Skin: No rash noted. No erythema.  Psychiatric: She has a normal mood and affect. Her behavior is normal. Judgment and thought content normal.  sad  weak R grip  Lab Results  Component Value Date   WBC 3.8* 09/13/2012   HGB 12.7 09/13/2012   HCT 38.8 09/13/2012   PLT 136* 09/13/2012   GLUCOSE 98 09/13/2012   ALT 12 09/13/2012   AST 18 09/13/2012   NA 140 09/13/2012   K 4.2 09/13/2012  CL 105 09/13/2012   CREATININE 1.2* 09/13/2012   BUN 25.9 09/13/2012   CO2 27 09/13/2012   TSH 2.88 01/09/2011   INR 1.0 09/15/2012           Assessment & Plan:

## 2012-09-21 ENCOUNTER — Ambulatory Visit (INDEPENDENT_AMBULATORY_CARE_PROVIDER_SITE_OTHER): Payer: Medicare Other | Admitting: Surgery

## 2012-09-21 ENCOUNTER — Encounter: Payer: Self-pay | Admitting: Surgery

## 2012-09-21 VITALS — BP 124/64 | HR 79 | Resp 20 | Ht 62.0 in | Wt 163.0 lb

## 2012-09-21 DIAGNOSIS — I712 Thoracic aortic aneurysm, without rupture, unspecified: Secondary | ICD-10-CM

## 2012-09-21 DIAGNOSIS — I359 Nonrheumatic aortic valve disorder, unspecified: Secondary | ICD-10-CM | POA: Diagnosis not present

## 2012-09-21 DIAGNOSIS — I351 Nonrheumatic aortic (valve) insufficiency: Secondary | ICD-10-CM

## 2012-09-21 NOTE — Progress Notes (Addendum)
[address]       [place] [address]             [phone]      HPI:  The patient returns today for review of her cardiac catheterization and 2-D echocardiogram in preparation for surgical treatment of a 5.8 cm ascending aortic aneurysm with moderate to severe aortic insufficiency. She's been doing well overall. She had one episode of dull substernal chest discomfort recently which she thought was indigestion. This did not last very long and has not recurred.  Current Outpatient Prescriptions  Medication Sig Dispense Refill  . esomeprazole (NEXIUM) 40 MG capsule Take 40 mg by mouth as needed.      . fluconazole (DIFLUCAN) 150 MG tablet Take 150 mg by mouth once. PRN ONLY with ABX      . hyoscyamine (LEVSIN SL) 0.125 MG SL tablet Place 0.125-0.25 mg under the tongue as needed.      Marland Kitchen oxyCODONE (OXYCONTIN) 10 MG 12 hr tablet Take 1 tablet (10 mg total) by mouth every 12 (twelve) hours as needed for pain. Please fill on 11/16/12  60 tablet  0  . PRESCRIPTION MEDICATION Sympony, infusion as direct      . rOPINIRole (REQUIP) 2 MG tablet TAKE ONE TABLET BY MOUTH THREE TIMES DAILY  90 tablet  0  . triazolam (HALCION) 0.25 MG tablet Take 1 tablet (0.25 mg total) by mouth at bedtime as needed.  30 tablet  5  . VAGIFEM 10 MCG TABS INSERT ONE TABLET VAGINALLY EVERY 2 (TWO) DAYS  15 tablet  1   No current facility-administered medications for this visit.     Physical Exam: \BP 124/64  Pulse 79  Resp 20  Ht 5\' 2"  (1.575 m)  Wt 163 lb (73.936 kg)  BMI 29.81 kg/m2  SpO2 98% She looks well. Cardiac exam shows a regular rate and rhythm with a grade 2/6 diastolic murmur at the apex. Lung exam is clear. There is no peripheral edema.   Diagnostic Tests:  *[place]*                *[facility]*                      [address]                     [place], [place] [address]                         [phone]     ------------------------------------------------------------ Transthoracic Echocardiography  Patient:    Diane Abbott, Diane Abbott MR #:       [id number] Study Date: 09/08/2012 Gender:     F Age:        8 Height: Weight:     73.4kg BSA: Pt. Status: Room:    PERFORMING   [place], [facility]     Evelene Croon  SONOGRAPHER  Melissa Morford, RDCS cc:  ------------------------------------------------------------ LV EF: 60% -   65%  ------------------------------------------------------------ Indications:      COPD 496.  ------------------------------------------------------------ History:   Risk factors:  Diabetes mellitus.  ------------------------------------------------------------ Study Conclusions  - Left ventricle: The cavity size was normal. Wall thickness   was normal. Systolic function was normal. The estimated   ejection fraction was in the range of 60% to 65%. - Aortic valve: Moderate regurgitation. - Aorta: The aorta was moderately dilated. Impressions:  -  There is moderate aortic insufficiency . There is a   moderate - large ascending aortic areurism. Suggest CT   angiogram of the chest to look specifically at the aorta   for further evaluation if indicated. Transthoracic echocardiography.  M-mode, complete 2D, spectral Doppler, and color Doppler.  Weight:  Weight: 73.4kg. Weight: 161.5lb.  Blood pressure:     132/62. Patient status:  Outpatient.  Location:  Echo laboratory.   ------------------------------------------------------------  ------------------------------------------------------------ Left ventricle:  The cavity size was normal. Wall thickness was normal. Systolic function was normal. The estimated ejection fraction was in the range of 60% to 65%.  ------------------------------------------------------------ Aortic valve:   Doppler:   There was no stenosis. Moderate  regurgitation.  ------------------------------------------------------------ Aorta:  The aorta was moderately dilated.  ------------------------------------------------------------ Mitral valve:   The valve appears to be grossly normal. Doppler:   No significant regurgitation.    Peak gradient: 3mm Hg (D).  ------------------------------------------------------------ Left atrium:  The atrium was normal in size.  ------------------------------------------------------------ Right ventricle:  The cavity size was normal. Systolic function was normal.  ------------------------------------------------------------ Pulmonic valve:    Structurally normal valve.   Cusp separation was normal.  Doppler:  Transvalvular velocity was within the normal range.  No regurgitation.  ------------------------------------------------------------ Tricuspid valve:   Structurally normal valve.   Leaflet separation was normal.  Doppler:  Transvalvular velocity was within the normal range.  Trivial regurgitation.  ------------------------------------------------------------ Pulmonary artery:   Systolic pressure was within the normal range.  ------------------------------------------------------------ Right atrium:  The atrium was at the upper limits of normal in size.  ------------------------------------------------------------ Pericardium:  There was no pericardial effusion.  ------------------------------------------------------------ Systemic veins: Inferior vena cava: The vessel was normal in size; the respirophasic diameter changes were in the normal range (= 50%); findings are consistent with normal central venous pressure.  ------------------------------------------------------------ Post procedure conclusions Ascending Aorta:  - The aorta was moderately dilated.  ------------------------------------------------------------  2D measurements        Normal  Doppler                Normal Left ventricle                 measurements LVID ED,     45.3 mm   43-52   Main pulmonary chord, PLAX                    artery LVID ES,     35.2 mm   23-38   Pressure, S    29 mm  =30 chord, PLAX                                      Hg FS, chord,     22 %    >29     Left ventricle PLAX                           Ea, lat      9.32 cm/ ------- LVPW, ED     9.93 mm   ------  ann, tiss         s IVS/LVPW      0.9      <1.3    DP ratio, ED                      E/Ea, lat    9.38     -------  Ventricular septum             ann, tiss IVS, ED      8.92 mm   ------  DP Aorta                          Ea, med      7.57 cm/ ------- Root diam,     27 mm   ------  ann, tiss         s ED                             DP Left atrium                    E/Ea, med   11.55     ------- AP dim         27 mm   ------  ann, tiss                                DP                                Aortic valve                                Regurg PHT    313 ms  -------                                Mitral valve                                Peak E vel   87.4 cm/ -------                                                  s                                Peak A vel   98.2 cm/ -------                                                  s                                Deceleratio   201 ms  150-230                                n time                                Peak            3 mm  -------  gradient, D       Hg                                Peak E/A      0.9     -------                                ratio                                Tricuspid valve                                Regurg peak   220 cm/ -------                                vel               s                                Peak RV-RA     19 mm  -------                                gradient, S       Hg                                Systemic veins                                Estimated      10 mm   -------                                CVP               Hg                                Right ventricle                                Pressure, S    29 mm  <30                                                  Hg                                Sa vel, lat  15.9 cm/ -------                                ann, tiss  s                                DP   ------------------------------------------------------------ Prepared and Electronically Authenticated by  Kristeen Miss [id number]:[id number] Procedural Findings: Hemodynamics (mmHg) RA mean 3 RV 26/4 PA 21/15 PCWP mean 9 LV 130/12 AO 124/80  Oxygen saturations: PA 69% AO 90%  Cardiac Output (Fick) 6.1  Cardiac Index (Fick) 3.4            Coronary angiography: Coronary dominance: right  Left mainstem: No significant disease.   Left anterior descending (LAD): Mild luminal irregularities.    Left circumflex (LCx): No significant disease.  There is a small to moderate ramus and a large PLOM.   Right coronary artery (RCA): No significant disease.    Aortic root angiography: 3+ aortic insufficiency.  Aneurysmal dilatation from the sinotubular junction to the proximal aortic arch.    Final Conclusions:  No significant CAD.  3+ aortic insufficiency.    Recommendations: Surgery per Dr. Laneta Simmers.  Will need to check on timing, if surgery will be delayed a significant amount of time would restart Xarelto.   Marca Ancona 09/15/2012    Impression:  She has a 5.8 cm ascending aortic aneurysm that essentially extends from the sinotubular junction to the proximal to mid aortic arch associated with moderate to severe aortic insufficiency. I have reviewed her 2-D echocardiogram and the aortic valve leaflets looked somewhat thickened and contracted with no coaptation centrally. Her aortic annulus does not appear enlarged and the aortic sinuses are not enlarged. I suspect that her aortic insufficiency is not related to the  aneurysm but to a primary abnormality of the leaflets. I will evaluate them in the operating room but I suspect that her valve is not suitable for repair. She will require a hemi-arch repair using deep hypothermic circulatory arrest. I would plan to use a pericardial valve in a Dacron conduit. I discussed the operative procedure with the patient and family including alternatives, benefits and risks; including but not limited to bleeding, blood transfusion, infection, stroke, myocardial infarction, coronary anastomotic stenosis, heart block requiring a permanent pacemaker, organ dysfunction, and death.  Wandra Mannan understands and agrees to proceed.    Plan:  Biological Bentall procedure on Thursday 09/29/12.     Addendum:    I reviewed her most recent visit with Dr. Arbutus Ped concerning the need for anticoagulation. He felt that she had a family history of a hypercoagulable disorder but did not have a hypercoagulable disorder herself. He did not think that she needed anticoagulation so I am not planning to restart the Xarelto. The patient is also very concerned about her pain management postop. She has been on chronic Oxycontin and Requip, both of which she says she has to have to function. I told her I would plan to restart them postop. She also has to have Pyridium when she has a foley and has to be on Diflucan when she has any antibiotic or she develops bad thrush.

## 2012-09-22 ENCOUNTER — Other Ambulatory Visit: Payer: Self-pay | Admitting: *Deleted

## 2012-09-22 DIAGNOSIS — I712 Thoracic aortic aneurysm, without rupture: Secondary | ICD-10-CM

## 2012-09-22 DIAGNOSIS — I359 Nonrheumatic aortic valve disorder, unspecified: Secondary | ICD-10-CM

## 2012-09-23 NOTE — Pre-Procedure Instructions (Signed)
[place]  09/23/2012   Your procedure is scheduled on:  Thursday Sep 29, 2012.  Report to [facility] 3rd Floor at 5:30 AM.  Call this number if you have problems the morning of surgery: [phone]   Remember:   Do not eat food or drink liquids after midnight.   Take these medicines the morning of surgery with A SIP OF WATER: Esomeprazole (Nexium), Oxycodone (Oxycontin)  if needed for pain and Requip   Do not wear jewelry, make-up or nail polish.  Do not wear lotions, powders, or perfumes.   Do not shave 48 hours prior to surgery.   Do not bring valuables to the hospital.  Contacts, dentures or bridgework may not be worn into surgery.  Leave suitcase in the car. After surgery it may be brought to your room.  For patients admitted to the hospital, checkout time is 11:00 AM the day of discharge.   Patients discharged the day of surgery will not be allowed to drive home.  Name and phone number of your driver: Family/Friend  Special Instructions: Shower using CHG 2 nights before surgery and the night before surgery.  If you shower the day of surgery use CHG.  Use special wash - you have one bottle of CHG for all showers.  You should use approximately 1/3 of the bottle for each shower.   Please read over the following fact sheets that you were given: Pain Booklet, Coughing and Deep Breathing, Blood Transfusion Information, MRSA Information and Surgical Site Infection Prevention

## 2012-09-27 ENCOUNTER — Encounter (HOSPITAL_COMMUNITY): Payer: Self-pay

## 2012-09-27 ENCOUNTER — Encounter (HOSPITAL_COMMUNITY)
Admission: RE | Admit: 2012-09-27 | Discharge: 2012-09-27 | Disposition: A | Discharge status: Home / Self Care | Payer: Medicare Other | Source: Ambulatory Visit | Attending: Surgery | Admitting: Surgery

## 2012-09-27 ENCOUNTER — Inpatient Hospital Stay (HOSPITAL_COMMUNITY)
Admission: RE | Admit: 2012-09-27 | Discharge: 2012-10-05 | DRG: 220 | Disposition: A | Discharge status: Home Health | Payer: Medicare Other | Source: Ambulatory Visit | Attending: Surgery | Admitting: Surgery

## 2012-09-27 ENCOUNTER — Encounter: Payer: Self-pay | Admitting: Cardiology

## 2012-09-27 ENCOUNTER — Ambulatory Visit (HOSPITAL_COMMUNITY)
Admission: RE | Admit: 2012-09-27 | Discharge: 2012-09-27 | Disposition: A | Discharge status: Home / Self Care | Payer: Medicare Other | Source: Ambulatory Visit | Attending: Surgery | Admitting: Surgery

## 2012-09-27 ENCOUNTER — Encounter (HOSPITAL_COMMUNITY): Payer: Self-pay | Admitting: Pharmacy Technician

## 2012-09-27 VITALS — BP 110/68 | HR 71 | Temp 97.0°F | Resp 20 | Ht 61.0 in | Wt 165.0 lb

## 2012-09-27 DIAGNOSIS — IMO0001 Reserved for inherently not codable concepts without codable children: Secondary | ICD-10-CM | POA: Diagnosis present

## 2012-09-27 DIAGNOSIS — K589 Irritable bowel syndrome without diarrhea: Secondary | ICD-10-CM | POA: Diagnosis present

## 2012-09-27 DIAGNOSIS — M949 Disorder of cartilage, unspecified: Secondary | ICD-10-CM | POA: Diagnosis present

## 2012-09-27 DIAGNOSIS — I319 Disease of pericardium, unspecified: Secondary | ICD-10-CM | POA: Diagnosis not present

## 2012-09-27 DIAGNOSIS — E538 Deficiency of other specified B group vitamins: Secondary | ICD-10-CM | POA: Diagnosis present

## 2012-09-27 DIAGNOSIS — K219 Gastro-esophageal reflux disease without esophagitis: Secondary | ICD-10-CM | POA: Diagnosis not present

## 2012-09-27 DIAGNOSIS — H269 Unspecified cataract: Secondary | ICD-10-CM | POA: Diagnosis present

## 2012-09-27 DIAGNOSIS — I359 Nonrheumatic aortic valve disorder, unspecified: Secondary | ICD-10-CM

## 2012-09-27 DIAGNOSIS — M899 Disorder of bone, unspecified: Secondary | ICD-10-CM | POA: Diagnosis present

## 2012-09-27 DIAGNOSIS — Z4682 Encounter for fitting and adjustment of non-vascular catheter: Secondary | ICD-10-CM | POA: Diagnosis not present

## 2012-09-27 DIAGNOSIS — Z01818 Encounter for other preprocedural examination: Secondary | ICD-10-CM | POA: Insufficient documentation

## 2012-09-27 DIAGNOSIS — F3289 Other specified depressive episodes: Secondary | ICD-10-CM | POA: Diagnosis present

## 2012-09-27 DIAGNOSIS — E559 Vitamin D deficiency, unspecified: Secondary | ICD-10-CM | POA: Diagnosis present

## 2012-09-27 DIAGNOSIS — Z86718 Personal history of other venous thrombosis and embolism: Secondary | ICD-10-CM

## 2012-09-27 DIAGNOSIS — M459 Ankylosing spondylitis of unspecified sites in spine: Secondary | ICD-10-CM | POA: Diagnosis present

## 2012-09-27 DIAGNOSIS — I712 Thoracic aortic aneurysm, without rupture, unspecified: Secondary | ICD-10-CM | POA: Diagnosis not present

## 2012-09-27 DIAGNOSIS — Z95828 Presence of other vascular implants and grafts: Secondary | ICD-10-CM

## 2012-09-27 DIAGNOSIS — E2749 Other adrenocortical insufficiency: Secondary | ICD-10-CM | POA: Diagnosis not present

## 2012-09-27 DIAGNOSIS — D62 Acute posthemorrhagic anemia: Secondary | ICD-10-CM | POA: Diagnosis not present

## 2012-09-27 DIAGNOSIS — J449 Chronic obstructive pulmonary disease, unspecified: Secondary | ICD-10-CM | POA: Diagnosis present

## 2012-09-27 DIAGNOSIS — M1990 Unspecified osteoarthritis, unspecified site: Secondary | ICD-10-CM | POA: Diagnosis present

## 2012-09-27 DIAGNOSIS — M069 Rheumatoid arthritis, unspecified: Secondary | ICD-10-CM | POA: Diagnosis present

## 2012-09-27 DIAGNOSIS — F329 Major depressive disorder, single episode, unspecified: Secondary | ICD-10-CM | POA: Diagnosis present

## 2012-09-27 DIAGNOSIS — R0989 Other specified symptoms and signs involving the circulatory and respiratory systems: Secondary | ICD-10-CM | POA: Diagnosis not present

## 2012-09-27 DIAGNOSIS — Z86711 Personal history of pulmonary embolism: Secondary | ICD-10-CM | POA: Diagnosis not present

## 2012-09-27 DIAGNOSIS — J9819 Other pulmonary collapse: Secondary | ICD-10-CM | POA: Diagnosis not present

## 2012-09-27 DIAGNOSIS — I714 Abdominal aortic aneurysm, without rupture, unspecified: Secondary | ICD-10-CM | POA: Diagnosis not present

## 2012-09-27 DIAGNOSIS — Z794 Long term (current) use of insulin: Secondary | ICD-10-CM | POA: Diagnosis not present

## 2012-09-27 DIAGNOSIS — Z952 Presence of prosthetic heart valve: Secondary | ICD-10-CM

## 2012-09-27 DIAGNOSIS — R0602 Shortness of breath: Secondary | ICD-10-CM | POA: Insufficient documentation

## 2012-09-27 DIAGNOSIS — D696 Thrombocytopenia, unspecified: Secondary | ICD-10-CM | POA: Diagnosis not present

## 2012-09-27 DIAGNOSIS — M19019 Primary osteoarthritis, unspecified shoulder: Secondary | ICD-10-CM | POA: Diagnosis present

## 2012-09-27 DIAGNOSIS — J4489 Other specified chronic obstructive pulmonary disease: Secondary | ICD-10-CM | POA: Insufficient documentation

## 2012-09-27 DIAGNOSIS — E119 Type 2 diabetes mellitus without complications: Secondary | ICD-10-CM | POA: Diagnosis present

## 2012-09-27 DIAGNOSIS — I719 Aortic aneurysm of unspecified site, without rupture: Secondary | ICD-10-CM | POA: Diagnosis not present

## 2012-09-27 DIAGNOSIS — Z01812 Encounter for preprocedural laboratory examination: Secondary | ICD-10-CM | POA: Insufficient documentation

## 2012-09-27 DIAGNOSIS — Z452 Encounter for adjustment and management of vascular access device: Secondary | ICD-10-CM | POA: Diagnosis not present

## 2012-09-27 DIAGNOSIS — J90 Pleural effusion, not elsewhere classified: Secondary | ICD-10-CM | POA: Diagnosis not present

## 2012-09-27 HISTORY — DX: Endocarditis, valve unspecified: I38

## 2012-09-27 HISTORY — DX: Dizziness and giddiness: R42

## 2012-09-27 HISTORY — DX: Bronchitis, not specified as acute or chronic: J40

## 2012-09-27 HISTORY — DX: Shortness of breath: R06.02

## 2012-09-27 HISTORY — DX: Angina pectoris, unspecified: I20.9

## 2012-09-27 HISTORY — DX: Unspecified urinary incontinence: R32

## 2012-09-27 HISTORY — DX: Cardiac murmur, unspecified: R01.1

## 2012-09-27 LAB — URINALYSIS, ROUTINE W REFLEX MICROSCOPIC
Glucose, UA: NEGATIVE mg/dL
Leukocytes, UA: NEGATIVE
Protein, ur: NEGATIVE mg/dL
Specific Gravity, Urine: 1.025 (ref 1.005–1.030)
Urobilinogen, UA: 0.2 mg/dL (ref 0.0–1.0)

## 2012-09-27 LAB — CBC
HCT: 37.7 % (ref 36.0–46.0)
Hemoglobin: 12.7 g/dL (ref 12.0–15.0)
MCH: 30.2 pg (ref 26.0–34.0)
MCV: 89.8 fL (ref 78.0–100.0)
Platelets: 148 10*3/uL — ABNORMAL LOW (ref 150–400)
RBC: 4.2 MIL/uL (ref 3.87–5.11)
WBC: 3.3 10*3/uL — ABNORMAL LOW (ref 4.0–10.5)

## 2012-09-27 LAB — COMPREHENSIVE METABOLIC PANEL
AST: 23 U/L (ref 0–37)
BUN: 23 mg/dL (ref 6–23)
CO2: 21 mEq/L (ref 19–32)
Calcium: 9.3 mg/dL (ref 8.4–10.5)
Chloride: 104 mEq/L (ref 96–112)
Creatinine, Ser: 1.12 mg/dL — ABNORMAL HIGH (ref 0.50–1.10)
GFR calc Af Amer: 54 mL/min — ABNORMAL LOW (ref >90)
GFR calc non Af Amer: 46 mL/min — ABNORMAL LOW (ref >90)
Glucose, Bld: 100 mg/dL — ABNORMAL HIGH (ref 70–99)
Total Bilirubin: 0.4 mg/dL (ref 0.3–1.2)

## 2012-09-27 LAB — BLOOD GAS, ARTERIAL
Acid-Base Excess: 0.6 mmol/L (ref 0.0–2.0)
Drawn by: 206361
O2 Saturation: 98.6 %
TCO2: 26.3 mmol/L (ref 0–100)
pCO2 arterial: 42.3 mmHg (ref 35.0–45.0)
pO2, Arterial: 92.2 mmHg (ref 80.0–100.0)

## 2012-09-27 LAB — SURGICAL PCR SCREEN
MRSA, PCR: POSITIVE — AB
Staphylococcus aureus: POSITIVE — AB

## 2012-09-27 LAB — URINE MICROSCOPIC-ADD ON

## 2012-09-27 LAB — HEMOGLOBIN A1C
Hgb A1c MFr Bld: 5.7 % — ABNORMAL HIGH (ref <5.7)
Mean Plasma Glucose: 117 mg/dL — ABNORMAL HIGH (ref <117)

## 2012-09-27 LAB — PROTIME-INR: Prothrombin Time: 12.7 seconds (ref 11.6–15.2)

## 2012-09-27 LAB — APTT: aPTT: 27 seconds (ref 24–37)

## 2012-09-28 MED ORDER — SODIUM CHLORIDE 0.9 % IV SOLN
INTRAVENOUS | Status: DC
  Filled 2012-09-28: qty 30

## 2012-09-28 MED ORDER — SODIUM CHLORIDE 0.9 % IV SOLN
INTRAVENOUS | Status: DC
  Filled 2012-09-28: qty 40

## 2012-09-28 MED ORDER — EPINEPHRINE HCL 1 MG/ML IJ SOLN
0.5000 ug/min | INTRAVENOUS | Status: DC
  Filled 2012-09-28: qty 4

## 2012-09-28 MED ORDER — DEXMEDETOMIDINE HCL IN NACL 400 MCG/100ML IV SOLN
0.1000 ug/kg/h | INTRAVENOUS | Status: AC
  Administered 2012-09-29: 0.3 ug/kg/h via INTRAVENOUS
  Filled 2012-09-28: qty 100

## 2012-09-28 MED ORDER — METOPROLOL TARTRATE 12.5 MG HALF TABLET
12.5000 mg | ORAL_TABLET | Freq: Once | ORAL | Status: AC
  Administered 2012-09-29: 12.5 mg via ORAL
  Filled 2012-09-28: qty 1

## 2012-09-28 MED ORDER — SODIUM CHLORIDE 0.9 % IV SOLN
INTRAVENOUS | Status: DC
  Filled 2012-09-28: qty 1

## 2012-09-28 MED ORDER — LEVOFLOXACIN IN D5W 500 MG/100ML IV SOLN
500.0000 mg | INTRAVENOUS | Status: AC
  Administered 2012-09-29: 500 mg via INTRAVENOUS
  Filled 2012-09-28 (×2): qty 100

## 2012-09-28 MED ORDER — POTASSIUM CHLORIDE 2 MEQ/ML IV SOLN
80.0000 meq | INTRAVENOUS | Status: DC
  Filled 2012-09-28: qty 40

## 2012-09-28 MED ORDER — PLASMA-LYTE 148 IV SOLN
INTRAVENOUS | Status: AC
  Administered 2012-09-29: 09:00:00
  Filled 2012-09-28: qty 2.5

## 2012-09-28 MED ORDER — DOPAMINE-DEXTROSE 3.2-5 MG/ML-% IV SOLN
2.0000 ug/kg/min | INTRAVENOUS | Status: AC
  Administered 2012-09-29: 3 ug/kg/min via INTRAVENOUS
  Filled 2012-09-28: qty 250

## 2012-09-28 MED ORDER — PHENYLEPHRINE HCL 10 MG/ML IJ SOLN
30.0000 ug/min | INTRAVENOUS | Status: AC
  Administered 2012-09-29: 13.33 ug/min via INTRAVENOUS
  Filled 2012-09-28 (×2): qty 2

## 2012-09-28 MED ORDER — MAGNESIUM SULFATE 50 % IJ SOLN
40.0000 meq | INTRAMUSCULAR | Status: DC
  Filled 2012-09-28: qty 10

## 2012-09-28 MED ORDER — NITROGLYCERIN IN D5W 200-5 MCG/ML-% IV SOLN
2.0000 ug/min | INTRAVENOUS | Status: DC
  Filled 2012-09-28: qty 250

## 2012-09-28 MED ORDER — SODIUM CHLORIDE 0.9 % IV SOLN
1250.0000 mg | INTRAVENOUS | Status: AC
  Administered 2012-09-29: 1250 mg via INTRAVENOUS
  Filled 2012-09-28: qty 1250

## 2012-09-28 MED ORDER — CHLORHEXIDINE GLUCONATE 4 % EX LIQD: 30.0000 mL | CUTANEOUS | Status: DC

## 2012-09-29 ENCOUNTER — Ambulatory Visit (HOSPITAL_COMMUNITY): Payer: Medicare Other | Admitting: Anesthesiology

## 2012-09-29 ENCOUNTER — Encounter (HOSPITAL_COMMUNITY): Payer: Self-pay | Admitting: Anesthesiology

## 2012-09-29 ENCOUNTER — Encounter (HOSPITAL_COMMUNITY): Payer: Self-pay | Admitting: Certified Registered"

## 2012-09-29 ENCOUNTER — Inpatient Hospital Stay (HOSPITAL_COMMUNITY): Payer: Medicare Other

## 2012-09-29 ENCOUNTER — Encounter (HOSPITAL_COMMUNITY)
Admission: RE | Disposition: A | Discharge status: Home Health | Payer: Self-pay | Source: Ambulatory Visit | Attending: Surgery

## 2012-09-29 DIAGNOSIS — I359 Nonrheumatic aortic valve disorder, unspecified: Secondary | ICD-10-CM

## 2012-09-29 DIAGNOSIS — I712 Thoracic aortic aneurysm, without rupture, unspecified: Secondary | ICD-10-CM

## 2012-09-29 HISTORY — PX: INTRAOPERATIVE TRANSESOPHAGEAL ECHOCARDIOGRAM: SHX5062

## 2012-09-29 HISTORY — PX: BENTALL PROCEDURE: SHX5058

## 2012-09-29 LAB — POCT I-STAT 3, ART BLOOD GAS (G3+)
Acid-Base Excess: 2 mmol/L (ref 0.0–2.0)
Acid-base deficit: 3 mmol/L — ABNORMAL HIGH (ref 0.0–2.0)
Bicarbonate: 26.4 mEq/L — ABNORMAL HIGH (ref 20.0–24.0)
Bicarbonate: 25.1 mEq/L — ABNORMAL HIGH (ref 20.0–24.0)
Bicarbonate: 25.2 mEq/L — ABNORMAL HIGH (ref 20.0–24.0)
O2 Saturation: 100 %
O2 Saturation: 100 %
Patient temperature: 35.6
TCO2: 28 mmol/L (ref 0–100)
TCO2: 26 mmol/L (ref 0–100)
TCO2: 26 mmol/L (ref 0–100)
pCO2 arterial: 41.2 mmHg (ref 35.0–45.0)
pCO2 arterial: 32.8 mmHg — ABNORMAL LOW (ref 35.0–45.0)
pH, Arterial: 7.35 (ref 7.350–7.450)
pH, Arterial: 7.391 (ref 7.350–7.450)
pH, Arterial: 7.486 — ABNORMAL HIGH (ref 7.350–7.450)
pH, Arterial: 7.367 (ref 7.350–7.450)
pO2, Arterial: 186 mmHg — ABNORMAL HIGH (ref 80.0–100.0)
pO2, Arterial: 110 mmHg — ABNORMAL HIGH (ref 80.0–100.0)
pO2, Arterial: 98 mmHg (ref 80.0–100.0)

## 2012-09-29 LAB — PROTIME-INR: Prothrombin Time: 16.3 seconds — ABNORMAL HIGH (ref 11.6–15.2)

## 2012-09-29 LAB — CBC
HCT: 23.8 % — ABNORMAL LOW (ref 36.0–46.0)
HCT: 26.5 % — ABNORMAL LOW (ref 36.0–46.0)
Hemoglobin: 9.4 g/dL — ABNORMAL LOW (ref 12.0–15.0)
Hemoglobin: 9.1 g/dL — ABNORMAL LOW (ref 12.0–15.0)
MCH: 30 pg (ref 26.0–34.0)
MCH: 30.4 pg (ref 26.0–34.0)
MCH: 30.2 pg (ref 26.0–34.0)
MCHC: 34.1 g/dL (ref 30.0–36.0)
MCV: 88.2 fL (ref 78.0–100.0)
MCV: 88.1 fL (ref 78.0–100.0)
MCV: 88 fL (ref 78.0–100.0)
Platelets: 152 10*3/uL (ref 150–400)
RBC: 3.13 MIL/uL — ABNORMAL LOW (ref 3.87–5.11)
RBC: 2.7 MIL/uL — ABNORMAL LOW (ref 3.87–5.11)
RBC: 3.01 MIL/uL — ABNORMAL LOW (ref 3.87–5.11)
RDW: 14 % (ref 11.5–15.5)
WBC: 7.6 10*3/uL (ref 4.0–10.5)

## 2012-09-29 LAB — POCT I-STAT 4, (NA,K, GLUC, HGB,HCT)
Glucose, Bld: 97 mg/dL (ref 70–99)
Glucose, Bld: 81 mg/dL (ref 70–99)
Glucose, Bld: 89 mg/dL (ref 70–99)
HCT: 26 % — ABNORMAL LOW (ref 36.0–46.0)
HCT: 21 % — ABNORMAL LOW (ref 36.0–46.0)
HCT: 26 % — ABNORMAL LOW (ref 36.0–46.0)
HCT: 27 % — ABNORMAL LOW (ref 36.0–46.0)
Hemoglobin: 11.6 g/dL — ABNORMAL LOW (ref 12.0–15.0)
Hemoglobin: 8.8 g/dL — ABNORMAL LOW (ref 12.0–15.0)
Hemoglobin: 8.5 g/dL — ABNORMAL LOW (ref 12.0–15.0)
Hemoglobin: 8.8 g/dL — ABNORMAL LOW (ref 12.0–15.0)
Potassium: 3.5 mEq/L (ref 3.5–5.1)
Potassium: 3.5 mEq/L (ref 3.5–5.1)
Potassium: 4.5 mEq/L (ref 3.5–5.1)
Potassium: 3.3 mEq/L — ABNORMAL LOW (ref 3.5–5.1)
Sodium: 142 mEq/L (ref 135–145)
Sodium: 125 mEq/L — ABNORMAL LOW (ref 135–145)
Sodium: 135 mEq/L (ref 135–145)

## 2012-09-29 LAB — POCT I-STAT, CHEM 8
Chloride: 106 mEq/L (ref 96–112)
Creatinine, Ser: 0.9 mg/dL (ref 0.50–1.10)
Glucose, Bld: 141 mg/dL — ABNORMAL HIGH (ref 70–99)
HCT: 36 % (ref 36.0–46.0)
Hemoglobin: 12.2 g/dL (ref 12.0–15.0)
Potassium: 3.9 mEq/L (ref 3.5–5.1)
Sodium: 143 mEq/L (ref 135–145)

## 2012-09-29 LAB — CREATININE, SERUM
GFR calc Af Amer: 74 mL/min — ABNORMAL LOW (ref >90)
GFR calc non Af Amer: 64 mL/min — ABNORMAL LOW (ref >90)

## 2012-09-29 LAB — GLUCOSE, CAPILLARY
Glucose-Capillary: 150 mg/dL — ABNORMAL HIGH (ref 70–99)
Glucose-Capillary: 153 mg/dL — ABNORMAL HIGH (ref 70–99)
Glucose-Capillary: 133 mg/dL — ABNORMAL HIGH (ref 70–99)

## 2012-09-29 SURGERY — BENTALL PROCEDURE
Anesthesia: General | Site: Chest | Wound class: Clean

## 2012-09-29 MED ORDER — CHLORHEXIDINE GLUCONATE CLOTH 2 % EX PADS
6.0000 | MEDICATED_PAD | Freq: Every day | CUTANEOUS | Status: AC
  Administered 2012-09-30 – 2012-10-04 (×5): 6 via TOPICAL

## 2012-09-29 MED ORDER — POTASSIUM CHLORIDE 10 MEQ/50ML IV SOLN
10.0000 meq | Freq: Once | INTRAVENOUS | Status: AC
  Administered 2012-09-29: 10 meq via INTRAVENOUS

## 2012-09-29 MED ORDER — DEXMEDETOMIDINE HCL IN NACL 200 MCG/50ML IV SOLN
0.1000 ug/kg/h | INTRAVENOUS | Status: DC
  Administered 2012-09-29: 0.7 ug/kg/h via INTRAVENOUS
  Filled 2012-09-29: qty 50

## 2012-09-29 MED ORDER — POTASSIUM CHLORIDE 10 MEQ/50ML IV SOLN
10.0000 meq | INTRAVENOUS | Status: AC
  Administered 2012-09-29 (×3): 10 meq via INTRAVENOUS

## 2012-09-29 MED ORDER — FAMOTIDINE IN NACL 20-0.9 MG/50ML-% IV SOLN
20.0000 mg | Freq: Two times a day (BID) | INTRAVENOUS | Status: AC
  Administered 2012-09-29 – 2012-09-30 (×2): 20 mg via INTRAVENOUS
  Filled 2012-09-29: qty 50

## 2012-09-29 MED ORDER — DOCUSATE SODIUM 100 MG PO CAPS
200.0000 mg | ORAL_CAPSULE | Freq: Every day | ORAL | Status: DC
  Administered 2012-09-30 – 2012-10-01 (×2): 200 mg via ORAL
  Filled 2012-09-29 (×2): qty 2

## 2012-09-29 MED ORDER — SODIUM CHLORIDE 0.9 % IV SOLN: INTRAVENOUS | Status: DC

## 2012-09-29 MED ORDER — NITROGLYCERIN IN D5W 200-5 MCG/ML-% IV SOLN: 0.0000 ug/min | INTRAVENOUS | Status: DC

## 2012-09-29 MED ORDER — PANTOPRAZOLE SODIUM 40 MG PO TBEC
40.0000 mg | DELAYED_RELEASE_TABLET | Freq: Every day | ORAL | Status: DC
  Administered 2012-10-01: 40 mg via ORAL
  Filled 2012-09-29 (×2): qty 1

## 2012-09-29 MED ORDER — MUPIROCIN 2 % EX OINT
1.0000 "application " | TOPICAL_OINTMENT | Freq: Two times a day (BID) | CUTANEOUS | Status: AC
  Administered 2012-09-29 – 2012-10-04 (×10): 1 via NASAL
  Filled 2012-09-29: qty 22

## 2012-09-29 MED ORDER — ACETAMINOPHEN 500 MG PO TABS
1000.0000 mg | ORAL_TABLET | Freq: Four times a day (QID) | ORAL | Status: DC
  Administered 2012-09-30 – 2012-10-02 (×9): 1000 mg via ORAL
  Filled 2012-09-29 (×14): qty 2

## 2012-09-29 MED ORDER — ASPIRIN 81 MG PO CHEW: 324.0000 mg | CHEWABLE_TABLET | Freq: Every day | ORAL | Status: DC

## 2012-09-29 MED ORDER — ROCURONIUM BROMIDE 100 MG/10ML IV SOLN
INTRAVENOUS | Status: DC | PRN
  Administered 2012-09-29 (×2): 50 mg via INTRAVENOUS

## 2012-09-29 MED ORDER — FENTANYL CITRATE 0.05 MG/ML IJ SOLN
INTRAMUSCULAR | Status: DC | PRN
  Administered 2012-09-29: 150 ug via INTRAVENOUS
  Administered 2012-09-29: 250 ug via INTRAVENOUS
  Administered 2012-09-29: 100 ug via INTRAVENOUS
  Administered 2012-09-29: 250 ug via INTRAVENOUS
  Administered 2012-09-29: 50 ug via INTRAVENOUS
  Administered 2012-09-29: 250 ug via INTRAVENOUS
  Administered 2012-09-29: 200 ug via INTRAVENOUS
  Administered 2012-09-29: 50 ug via INTRAVENOUS
  Administered 2012-09-29 (×2): 100 ug via INTRAVENOUS

## 2012-09-29 MED ORDER — LACTATED RINGERS IV SOLN: 500.0000 mL | Freq: Once | INTRAVENOUS | Status: AC | PRN

## 2012-09-29 MED ORDER — SODIUM CHLORIDE 0.9 % IJ SOLN
3.0000 mL | Freq: Two times a day (BID) | INTRAMUSCULAR | Status: DC
  Administered 2012-09-30: 3 mL via INTRAVENOUS
  Administered 2012-09-30: 22:00:00 via INTRAVENOUS
  Administered 2012-10-01 (×2): 3 mL via INTRAVENOUS

## 2012-09-29 MED ORDER — HEMOSTATIC AGENTS (NO CHARGE) OPTIME
TOPICAL | Status: DC | PRN
  Administered 2012-09-29: 1 via TOPICAL

## 2012-09-29 MED ORDER — LACTATED RINGERS IV SOLN: INTRAVENOUS | Status: DC

## 2012-09-29 MED ORDER — HEPARIN SODIUM (PORCINE) 1000 UNIT/ML IJ SOLN
INTRAMUSCULAR | Status: DC | PRN
  Administered 2012-09-29: 21000 [IU] via INTRAVENOUS

## 2012-09-29 MED ORDER — PROTAMINE SULFATE 10 MG/ML IV SOLN
INTRAVENOUS | Status: DC | PRN
  Administered 2012-09-29 (×3): 50 mg via INTRAVENOUS
  Administered 2012-09-29: 20 mg via INTRAVENOUS

## 2012-09-29 MED ORDER — FLUCONAZOLE 150 MG PO TABS
150.0000 mg | ORAL_TABLET | Freq: Once | ORAL | Status: AC
  Administered 2012-09-30: 150 mg via ORAL
  Filled 2012-09-29: qty 1

## 2012-09-29 MED ORDER — MIDAZOLAM HCL 5 MG/5ML IJ SOLN
INTRAMUSCULAR | Status: DC | PRN
  Administered 2012-09-29: 4 mg via INTRAVENOUS
  Administered 2012-09-29 (×2): 2 mg via INTRAVENOUS
  Administered 2012-09-29: 1 mg via INTRAVENOUS
  Administered 2012-09-29: 3 mg via INTRAVENOUS

## 2012-09-29 MED ORDER — MIDAZOLAM HCL 2 MG/2ML IJ SOLN: 2.0000 mg | INTRAMUSCULAR | Status: DC | PRN

## 2012-09-29 MED ORDER — SODIUM CHLORIDE 0.9 % IJ SOLN: 3.0000 mL | INTRAMUSCULAR | Status: DC | PRN

## 2012-09-29 MED ORDER — THROMBIN 20000 UNITS EX SOLR
CUTANEOUS | Status: AC
  Filled 2012-09-29: qty 20000

## 2012-09-29 MED ORDER — ALBUMIN HUMAN 5 % IV SOLN
INTRAVENOUS | Status: DC | PRN
  Administered 2012-09-29: 14:00:00 via INTRAVENOUS

## 2012-09-29 MED ORDER — SODIUM CHLORIDE 0.9 % IV SOLN: 250.0000 mL | INTRAVENOUS | Status: DC

## 2012-09-29 MED ORDER — METHYLPREDNISOLONE SODIUM SUCC 125 MG IJ SOLR
INTRAMUSCULAR | Status: DC | PRN
  Administered 2012-09-29: 125 mg via INTRAVENOUS

## 2012-09-29 MED ORDER — METOPROLOL TARTRATE 1 MG/ML IV SOLN: 2.5000 mg | INTRAVENOUS | Status: DC | PRN

## 2012-09-29 MED ORDER — HEMOSTATIC AGENTS (NO CHARGE) OPTIME
TOPICAL | Status: DC | PRN
  Administered 2012-09-29 (×2): 1 via TOPICAL

## 2012-09-29 MED ORDER — LACTATED RINGERS IV SOLN
INTRAVENOUS | Status: DC | PRN
  Administered 2012-09-29 (×2): via INTRAVENOUS

## 2012-09-29 MED ORDER — DEXTROSE 5 % IV SOLN
1.5000 g | Freq: Two times a day (BID) | INTRAVENOUS | Status: AC
  Administered 2012-09-29 – 2012-10-01 (×4): 1.5 g via INTRAVENOUS
  Filled 2012-09-29 (×4): qty 1.5

## 2012-09-29 MED ORDER — MORPHINE SULFATE 2 MG/ML IJ SOLN: 1.0000 mg | INTRAMUSCULAR | Status: DC | PRN

## 2012-09-29 MED ORDER — PHENAZOPYRIDINE HCL 100 MG PO TABS
100.0000 mg | ORAL_TABLET | Freq: Three times a day (TID) | ORAL | Status: AC
  Administered 2012-09-30 – 2012-10-01 (×5): 100 mg via ORAL
  Filled 2012-09-29 (×6): qty 1

## 2012-09-29 MED ORDER — BISACODYL 10 MG RE SUPP: 10.0000 mg | Freq: Every day | RECTAL | Status: DC

## 2012-09-29 MED ORDER — ASPIRIN EC 325 MG PO TBEC: 325.0000 mg | DELAYED_RELEASE_TABLET | Freq: Every day | ORAL | Status: DC

## 2012-09-29 MED ORDER — SODIUM CHLORIDE 0.9 % IV SOLN
10.0000 g | INTRAVENOUS | Status: DC | PRN
  Administered 2012-09-29: 5 g/h via INTRAVENOUS

## 2012-09-29 MED ORDER — ACETAMINOPHEN 10 MG/ML IV SOLN
1000.0000 mg | Freq: Once | INTRAVENOUS | Status: AC
  Administered 2012-09-29: 1000 mg via INTRAVENOUS
  Filled 2012-09-29: qty 100

## 2012-09-29 MED ORDER — METOPROLOL TARTRATE 12.5 MG HALF TABLET
12.5000 mg | ORAL_TABLET | Freq: Two times a day (BID) | ORAL | Status: DC
  Filled 2012-09-29 (×3): qty 1

## 2012-09-29 MED ORDER — ROPINIROLE HCL 1 MG PO TABS
2.0000 mg | ORAL_TABLET | Freq: Three times a day (TID) | ORAL | Status: DC
  Administered 2012-09-30 – 2012-10-05 (×16): 2 mg via ORAL
  Filled 2012-09-29 (×20): qty 2

## 2012-09-29 MED ORDER — MORPHINE SULFATE 2 MG/ML IJ SOLN: 2.0000 mg | INTRAMUSCULAR | Status: DC | PRN

## 2012-09-29 MED ORDER — ONDANSETRON HCL 4 MG/2ML IJ SOLN
4.0000 mg | Freq: Four times a day (QID) | INTRAMUSCULAR | Status: DC | PRN
  Administered 2012-09-30: 4 mg via INTRAVENOUS
  Filled 2012-09-29: qty 2

## 2012-09-29 MED ORDER — ACETAMINOPHEN 160 MG/5ML PO SOLN: 975.0000 mg | Freq: Four times a day (QID) | ORAL | Status: DC

## 2012-09-29 MED ORDER — OXYCODONE HCL ER 10 MG PO T12A
10.0000 mg | EXTENDED_RELEASE_TABLET | Freq: Two times a day (BID) | ORAL | Status: DC | PRN
  Administered 2012-10-02 – 2012-10-03 (×3): 10 mg via ORAL
  Filled 2012-09-29 (×4): qty 1

## 2012-09-29 MED ORDER — ARTIFICIAL TEARS OP OINT
TOPICAL_OINTMENT | OPHTHALMIC | Status: DC | PRN
  Administered 2012-09-29: 1 via OPHTHALMIC

## 2012-09-29 MED ORDER — INSULIN REGULAR BOLUS VIA INFUSION
0.0000 [IU] | Freq: Three times a day (TID) | INTRAVENOUS | Status: DC
  Filled 2012-09-29: qty 10

## 2012-09-29 MED ORDER — FLUCONAZOLE 150 MG PO TABS
150.0000 mg | ORAL_TABLET | Freq: Once | ORAL | Status: DC
  Filled 2012-09-29: qty 1

## 2012-09-29 MED ORDER — THROMBIN 20000 UNITS EX SOLR
OROMUCOSAL | Status: DC | PRN
  Administered 2012-09-29: 09:00:00 via TOPICAL

## 2012-09-29 MED ORDER — THROMBIN 20000 UNITS EX SOLR
CUTANEOUS | Status: DC | PRN
  Administered 2012-09-29: 20000 [IU] via TOPICAL

## 2012-09-29 MED ORDER — METOPROLOL TARTRATE 25 MG/10 ML ORAL SUSPENSION
12.5000 mg | Freq: Two times a day (BID) | ORAL | Status: DC
  Filled 2012-09-29 (×3): qty 5

## 2012-09-29 MED ORDER — LIDOCAINE HCL (CARDIAC) 20 MG/ML IV SOLN
INTRAVENOUS | Status: DC | PRN
  Administered 2012-09-29: 60 mg via INTRAVENOUS

## 2012-09-29 MED ORDER — VECURONIUM BROMIDE 10 MG IV SOLR
INTRAVENOUS | Status: DC | PRN
  Administered 2012-09-29: 10 mg via INTRAVENOUS

## 2012-09-29 MED ORDER — ALBUMIN HUMAN 5 % IV SOLN
250.0000 mL | INTRAVENOUS | Status: AC | PRN
  Administered 2012-09-29 (×3): 250 mL via INTRAVENOUS
  Filled 2012-09-29: qty 500

## 2012-09-29 MED ORDER — BISACODYL 5 MG PO TBEC
10.0000 mg | DELAYED_RELEASE_TABLET | Freq: Every day | ORAL | Status: DC
  Administered 2012-09-30 – 2012-10-01 (×2): 10 mg via ORAL
  Filled 2012-09-29 (×3): qty 2

## 2012-09-29 MED ORDER — FENTANYL CITRATE 0.05 MG/ML IJ SOLN
50.0000 ug | INTRAMUSCULAR | Status: DC | PRN
  Administered 2012-09-29 – 2012-09-30 (×2): 50 ug via INTRAVENOUS
  Filled 2012-09-29: qty 2

## 2012-09-29 MED ORDER — DOPAMINE-DEXTROSE 3.2-5 MG/ML-% IV SOLN: 0.0000 ug/kg/min | INTRAVENOUS | Status: DC

## 2012-09-29 MED ORDER — PHENYLEPHRINE HCL 10 MG/ML IJ SOLN
0.0000 ug/min | INTRAVENOUS | Status: DC
  Administered 2012-09-30 – 2012-10-01 (×2): 15 ug/min via INTRAVENOUS
  Filled 2012-09-29 (×3): qty 2

## 2012-09-29 MED ORDER — SODIUM CHLORIDE 0.45 % IV SOLN
INTRAVENOUS | Status: DC
  Administered 2012-09-29: 20 mL/h via INTRAVENOUS

## 2012-09-29 MED ORDER — MAGNESIUM SULFATE 40 MG/ML IJ SOLN
4.0000 g | Freq: Once | INTRAMUSCULAR | Status: AC
  Administered 2012-09-29: 4 g via INTRAVENOUS
  Filled 2012-09-29: qty 100

## 2012-09-29 MED ORDER — PROPOFOL 10 MG/ML IV BOLUS
INTRAVENOUS | Status: DC | PRN
  Administered 2012-09-29: 120 mg via INTRAVENOUS
  Administered 2012-09-29: 80 mg via INTRAVENOUS

## 2012-09-29 MED ORDER — SODIUM CHLORIDE 0.9 % IV SOLN
100.0000 [IU] | INTRAVENOUS | Status: DC | PRN
  Administered 2012-09-29: 1.1 [IU]/h via INTRAVENOUS

## 2012-09-29 MED ORDER — VANCOMYCIN HCL IN DEXTROSE 1-5 GM/200ML-% IV SOLN
1000.0000 mg | Freq: Once | INTRAVENOUS | Status: AC
  Administered 2012-09-29: 1000 mg via INTRAVENOUS
  Filled 2012-09-29: qty 200

## 2012-09-29 MED ORDER — 0.9 % SODIUM CHLORIDE (POUR BTL) OPTIME
TOPICAL | Status: DC | PRN
  Administered 2012-09-29: 1000 mL

## 2012-09-29 MED ORDER — OXYCODONE HCL 5 MG PO TABS
5.0000 mg | ORAL_TABLET | ORAL | Status: DC | PRN
  Administered 2012-09-30 – 2012-10-02 (×13): 10 mg via ORAL
  Filled 2012-09-29 (×8): qty 2
  Filled 2012-09-29: qty 1
  Filled 2012-09-29 (×5): qty 2

## 2012-09-29 SURGICAL SUPPLY — 90 items
ADAPTER CARDIO PERF ANTE/RETRO (ADAPTER) ×3 IMPLANT
ADPR PRFSN 84XANTGRD RTRGD (ADAPTER) ×2
APL SRG 7X2 LUM MLBL SLNT (VASCULAR PRODUCTS) ×4
APPLICATOR TIP COSEAL (VASCULAR PRODUCTS) ×2 IMPLANT
ATTRACTOMAT 16X20 MAGNETIC DRP (DRAPES) ×3 IMPLANT
BAG DECANTER FOR FLEXI CONT (MISCELLANEOUS) ×3 IMPLANT
BLADE KNIFE PERSONA 15 (BLADE) ×1 IMPLANT
BLADE STERNUM SYSTEM 6 (BLADE) ×3 IMPLANT
BLADE SURG 15 STRL LF DISP TIS (BLADE) ×2 IMPLANT
BLADE SURG 15 STRL SS (BLADE) ×3
BLADE SURG ROTATE 9660 (MISCELLANEOUS) ×1 IMPLANT
CANISTER SUCTION 2500CC (MISCELLANEOUS) ×3 IMPLANT
CANNULA GUNDRY RCSP 15FR (MISCELLANEOUS) ×3 IMPLANT
CATH ROBINSON RED A/P 18FR (CATHETERS) ×6 IMPLANT
CATH THORACIC 28FR (CATHETERS) ×1 IMPLANT
CATH THORACIC 36FR (CATHETERS) ×3 IMPLANT
CATH THORACIC 36FR RT ANG (CATHETERS) ×2 IMPLANT
CAUTERY HIGH TEMP VAS (MISCELLANEOUS) ×3 IMPLANT
CAUTERY SURG HI TEMP FINE TIP (MISCELLANEOUS) ×1 IMPLANT
CLOTH BEACON ORANGE TIMEOUT ST (SAFETY) ×3 IMPLANT
CONT SPEC 4OZ CLIKSEAL STRL BL (MISCELLANEOUS) ×2 IMPLANT
CONT SPEC STER OR (MISCELLANEOUS) ×3 IMPLANT
COVER SURGICAL LIGHT HANDLE (MISCELLANEOUS) ×5 IMPLANT
CRADLE DONUT ADULT HEAD (MISCELLANEOUS) ×3 IMPLANT
DRAPE SLUSH MACHINE 52X66 (DRAPES) IMPLANT
DRAPE SLUSH/WARMER DISC (DRAPES) ×1 IMPLANT
DRSG COVADERM 4X14 (GAUZE/BANDAGES/DRESSINGS) ×3 IMPLANT
ELECT CAUTERY BLADE 6.4 (BLADE) ×3 IMPLANT
ELECT REM PT RETURN 9FT ADLT (ELECTROSURGICAL) ×6
ELECTRODE REM PT RTRN 9FT ADLT (ELECTROSURGICAL) ×4 IMPLANT
GLOVE BIO SURGEON STRL SZ 6 (GLOVE) IMPLANT
GLOVE BIO SURGEON STRL SZ 6.5 (GLOVE) IMPLANT
GLOVE BIO SURGEON STRL SZ7 (GLOVE) IMPLANT
GLOVE BIO SURGEON STRL SZ7.5 (GLOVE) IMPLANT
GLOVE BIOGEL PI IND STRL 7.0 (GLOVE) IMPLANT
GLOVE BIOGEL PI INDICATOR 7.0 (GLOVE) ×3
GLOVE EUDERMIC 7 POWDERFREE (GLOVE) ×6 IMPLANT
GLOVE SURG SS PI 6.0 STRL IVOR (GLOVE) ×1 IMPLANT
GLOVE SURG SS PI 6.5 STRL IVOR (GLOVE) ×6 IMPLANT
GLOVE SURG SS PI 7.0 STRL IVOR (GLOVE) ×1 IMPLANT
GLOVE SURG SS PI 7.5 STRL IVOR (GLOVE) ×2 IMPLANT
GOWN PREVENTION PLUS XLARGE (GOWN DISPOSABLE) ×6 IMPLANT
GOWN STRL NON-REIN LRG LVL3 (GOWN DISPOSABLE) ×12 IMPLANT
GRAFT GELWEAVE VALSALVA 26 (Prosthesis & Implant Heart) IMPLANT
GRAFT GELWEAVE VALSALVA 26CM (Prosthesis & Implant Heart) ×3 IMPLANT
GRAFT HEMASHIELD 30X10 (Vascular Products) ×1 IMPLANT
HEART VENT LT CURVED (MISCELLANEOUS) ×3 IMPLANT
HEMOSTAT POWDER SURGIFOAM 1G (HEMOSTASIS) ×9 IMPLANT
HEMOSTAT SURGICEL 2X14 (HEMOSTASIS) ×3 IMPLANT
KIT BASIN OR (CUSTOM PROCEDURE TRAY) ×3 IMPLANT
KIT CATH CPB BARTLE (MISCELLANEOUS) ×3 IMPLANT
KIT ROOM TURNOVER OR (KITS) ×3 IMPLANT
KIT SUCTION CATH 14FR (SUCTIONS) ×6 IMPLANT
NS IRRIG 1000ML POUR BTL (IV SOLUTION) ×17 IMPLANT
PACK OPEN HEART (CUSTOM PROCEDURE TRAY) ×3 IMPLANT
PAD ARMBOARD 7.5X6 YLW CONV (MISCELLANEOUS) ×6 IMPLANT
SEALANT SURG COSEAL 4ML (VASCULAR PRODUCTS) ×1 IMPLANT
SEALANT SURG COSEAL 8ML (VASCULAR PRODUCTS) ×1 IMPLANT
SPONGE GAUZE 4X4 12PLY (GAUZE/BANDAGES/DRESSINGS) ×3 IMPLANT
SUT BONE WAX W31G (SUTURE) ×3 IMPLANT
SUT ETHIBON 2 0 V 52N 30 (SUTURE) ×6 IMPLANT
SUT ETHIBON EXCEL 2-0 V-5 (SUTURE) IMPLANT
SUT ETHIBOND V-5 VALVE (SUTURE) IMPLANT
SUT PROLENE 3 0 SH 1 (SUTURE) ×3 IMPLANT
SUT PROLENE 3 0 SH DA (SUTURE) IMPLANT
SUT PROLENE 4 0 RB 1 (SUTURE) ×18
SUT PROLENE 4-0 RB1 .5 CRCL 36 (SUTURE) ×8 IMPLANT
SUT PROLENE 5 0 C 1 36 (SUTURE) ×1 IMPLANT
SUT PROLENE 5 0 RB 2 (SUTURE) ×6 IMPLANT
SUT SILK  1 MH (SUTURE) ×1
SUT SILK 1 MH (SUTURE) IMPLANT
SUT SILK 2 0 SH CR/8 (SUTURE) ×1 IMPLANT
SUT STEEL 6MS V (SUTURE) ×2 IMPLANT
SUT STEEL STERNAL CCS#1 18IN (SUTURE) IMPLANT
SUT STEEL SZ 6 DBL 3X14 BALL (SUTURE) IMPLANT
SUT VIC AB 1 CTX 36 (SUTURE) ×12
SUT VIC AB 1 CTX36XBRD ANBCTR (SUTURE) ×4 IMPLANT
SUT VIC AB 2-0 CT1 27 (SUTURE)
SUT VIC AB 2-0 CT1 TAPERPNT 27 (SUTURE) IMPLANT
SUT VIC AB 3-0 X1 27 (SUTURE) IMPLANT
SUTURE E-PAK OPEN HEART (SUTURE) ×3 IMPLANT
SYSTEM SAHARA CHEST DRAIN ATS (WOUND CARE) ×3 IMPLANT
TOWEL OR 17X24 6PK STRL BLUE (TOWEL DISPOSABLE) ×3 IMPLANT
TOWEL OR 17X26 10 PK STRL BLUE (TOWEL DISPOSABLE) ×5 IMPLANT
TRAY FOLEY BAG SILVER LF 14FR (CATHETERS) ×1 IMPLANT
TRAY FOLEY IC TEMP SENS 14FR (CATHETERS) ×2 IMPLANT
TUBE SUCT INTRACARD DLP 20F (MISCELLANEOUS) ×3 IMPLANT
UNDERPAD 30X30 INCONTINENT (UNDERPADS AND DIAPERS) ×3 IMPLANT
VALVE MAGNA EASE 21MM (Prosthesis & Implant Heart) ×1 IMPLANT
WATER STERILE IRR 1000ML POUR (IV SOLUTION) ×6 IMPLANT

## 2012-09-29 NOTE — Interval H&P Note (Signed)
History and Physical Interval Note:  09/29/2012 7:41 AM  Diane Abbott  has presented today for surgery, with the diagnosis of ASCENDING AORTIC ANEURSYM AI  The various methods of treatment have been discussed with the patient and family. After consideration of risks, benefits and other options for treatment, the patient has consented to  Procedure(s) with comments: BENTALL PROCEDURE (N/A) - WITH CIRC ARREST INTRAOPERATIVE TRANSESOPHAGEAL ECHOCARDIOGRAM (N/A) as a surgical intervention .  The patient's history has been reviewed, patient examined, no change in status, stable for surgery.  I have reviewed the patient's chart and labs.  Questions were answered to the patient's satisfaction.     [name]

## 2012-09-29 NOTE — Progress Notes (Signed)
  Echocardiogram Echocardiogram Transesophageal has been performed.  Diane Abbott 09/29/2012, 9:11 AM

## 2012-09-29 NOTE — OR Nursing (Signed)
SICU First call @ 1310

## 2012-09-29 NOTE — Anesthesia Preprocedure Evaluation (Addendum)
Anesthesia Evaluation  Patient identified by MRN, date of birth, ID band Patient awake    Reviewed: Allergy & Precautions, H&P , NPO status , Patient's Chart, lab work & pertinent test results  History of Anesthesia Complications Negative for: history of anesthetic complications  Airway Mallampati: II TM Distance: >3 FB Neck ROM: Full    Dental  (+) Teeth Intact   Pulmonary shortness of breath, pneumonia -, resolved, COPD breath sounds clear to auscultation        Cardiovascular + angina + Peripheral Vascular Disease + Valvular Problems/Murmurs AI Rhythm:Regular Rate:Normal + Diastolic murmurs    Neuro/Psych PSYCHIATRIC DISORDERS Depression Fibromyalgia  Neuromuscular disease    GI/Hepatic GERD-  Medicated and Controlled,  Endo/Other    Renal/GU      Musculoskeletal  (+) Arthritis -, Fibromyalgia -, narcotic dependent  Abdominal   Peds  Hematology   Anesthesia Other Findings   Reproductive/Obstetrics                          Anesthesia Physical Anesthesia Plan  ASA: III  Anesthesia Plan: General   Post-op Pain Management:    Induction: Intravenous  Airway Management Planned: Oral ETT  Additional Equipment: Arterial line, TEE, PA Cath and Ultrasound Guidance Line Placement  Intra-op Plan:   Post-operative Plan: Post-operative intubation/ventilation  Informed Consent: I have reviewed the patients History and Physical, chart, labs and discussed the procedure including the risks, benefits and alternatives for the proposed anesthesia with the patient or authorized representative who has indicated his/her understanding and acceptance.     Plan Discussed with: CRNA, Surgeon and Anesthesiologist  Anesthesia Plan Comments:        Anesthesia Quick Evaluation

## 2012-09-29 NOTE — Progress Notes (Signed)
[facility] BRIEF SICU PROGRESS NOTE  Day of Surgery  S/P Procedure(s) (LRB): BENTALL PROCEDURE (N/A) INTRAOPERATIVE TRANSESOPHAGEAL ECHOCARDIOGRAM (N/A)   Sedated on vent AAI paced rhythm w/ stable hemodynamics Chest tube output trending down, 100-150 mL/hr Labs okay w/ Hgb 8.2  Plan: Watch chest tube output and Hgb, otherwise continue routine early postop  Diane Abbott,Diane Abbott 09/29/2012 7:20 PM

## 2012-09-29 NOTE — H&P (Signed)
[address]       [place] [address]             [phone]      PCP is Sonda Primes, MD  Referring Provider is Plotnikov, Georgina Quint, MD  Chief Complaint   Patient presents with   .  Thoracic Aortic Aneurysm     Surgical eval for ascending aortic aneurysm, Chest/Abd/Pelvic CT 08/24/12   HPI:  The patient is a 78 year old white female with a hx of fibromyalgia, rheumatoid and degenerative arthritis, possible familial hypercoagulable state with hx of DVT and PE on chronic Xarelto who was being evaluated for degenerative disease of cervical spine by Dr. Jeral Fruit. MRI of the cervical spine showed a 5 cm ascending aortic aneurysm. CTA of the chest shows the maximum diameter of the ascending aorta to be 5.8 cm with the aneurysm extending from the root to the mid portion of the aortic arch.The aorta has increased in size from 4.5 cm in 2009. She does get some chest pains and back pain but she has fibromyalgia and significant arthritis so she doesn't think much about it.  Past Medical History   Diagnosis  Date   .  Fibromyalgia    .  Pulmonary embolism    .  RA (rheumatoid arthritis)    .  Vitamin B 12 deficiency    .  Vitamin D deficiency    .  Osteoarthritis    .  Osteopenia    .  Adrenal insufficiency    .  IBS (irritable bowel syndrome)    .  History of blood clots  2008     below knee   .  Diabetes mellitus type II      pt denies this 03-05-11   .  Esophagitis    .  Depression    .  GERD (gastroesophageal reflux disease)    .  Connective tissue disorder    .  Cataracts, bilateral      more in left than right   .  Osteoarthritis of left shoulder  12/07/2011    Past Surgical History   Procedure  Laterality  Date   .  Abdominal hysterectomy     .  Total knee arthroplasty       bilateral   .  Cholecystectomy   2003   .  Tonsillectomy   1941   .  Appendectomy   1995   .  Wrist surgery       bilateral   .  Bilateral oophorectomy     .  Tummy tuck     .   Cosmetic surgery     .  Total shoulder arthroplasty   12/07/2011     Procedure: TOTAL SHOULDER ARTHROPLASTY; Surgeon: Eulas Post, MD; Location: [facility] OR; Service: Orthopedics; Laterality: Left; left total shoulder artthroplasty    Family History   Problem  Relation  Age of Onset   .  Ovarian cancer  Mother    .  Heart disease  Maternal Grandmother    .  Clotting disorder     .  Breast cancer     .  Diabetes  Mother    .  Pancreatic cancer     .  COPD  Father    .  Colon cancer  Neg Hx    Social History  History   Substance Use Topics   .  Smoking status:  Never Smoker   .  Smokeless tobacco:  Never Used   .  Alcohol Use:  Yes    Current Outpatient Prescriptions   Medication  Sig  Dispense  Refill   .  esomeprazole (NEXIUM) 40 MG capsule  Take 40 mg by mouth as needed.     .  hyoscyamine (LEVSIN SL) 0.125 MG SL tablet  Place 0.125-0.25 mg under the tongue as needed.     Marland Kitchen  oxyCODONE (OXYCONTIN) 10 MG 12 hr tablet  Take 1 tablet (10 mg total) by mouth every 12 (twelve) hours as needed for pain. Please fill on 09/16/12  60 tablet  0   .  Rivaroxaban (XARELTO) 20 MG TABS  Take 1 tablet (20 mg total) by mouth daily.  100 tablet  3   .  rOPINIRole (REQUIP) 2 MG tablet  TAKE ONE TABLET BY MOUTH THREE TIMES DAILY  90 tablet  0   .  triazolam (HALCION) 0.25 MG tablet  Take 1 tablet (0.25 mg total) by mouth at bedtime as needed.  30 tablet  5   .  VAGIFEM 10 MCG TABS  INSERT ONE TABLET VAGINALLY EVERY 2 (TWO) DAYS  15 tablet  1   .  fluconazole (DIFLUCAN) 150 MG tablet  Take 150 mg by mouth once. PRN ONLY with ABX      No current facility-administered medications for this visit.    Allergies   Allergen  Reactions   .  Codeine  Other (See Comments)     Blood pressure drops; "I pass out."   .  Pramipexole Dihydrochloride  Nausea Only     sick, falling   .  Rofecoxib  Other (See Comments)     "sleep walks"   .  Arava (Leflunomide)  Swelling   .  Clonazepam    .  Diclofenac Sodium    .   Venlafaxine    .  Alprazolam  Other (See Comments)     "Makes me mean"   .  Benzodiazepines  Other (See Comments)     "Halllucinations?"   .  Darvocet (Propoxyphene-Acetaminophen)  Other (See Comments)     "makes my eyes ache"   .  Duloxetine  Other (See Comments)     achy legs   .  Gabapentin  Other (See Comments)     headache   .  Latex  Itching and Dermatitis     When examined with latex gloves, burns and itches in contact areas per pt.   .  Morphine And Related  Other (See Comments)     Hallucinations   .  Nortriptyline Hcl  Other (See Comments)     Headache   .  Penicillins  Rash     Can take Cephalosporins   .  Prednisone  Swelling     Just doesn't want to take it.   Review of Systems  Constitutional: Positive for fatigue and unexpected weight change.  Weight gain.  HENT: Positive for hearing loss.  Eyes: Positive for visual disturbance.  Respiratory: Positive for cough, shortness of breath and wheezing.  Cardiovascular: Positive for chest pain.  Gastrointestinal: Positive for diarrhea and constipation.  Hiatal hernia  Genitourinary: Positive for frequency.  Musculoskeletal: Positive for myalgias, back pain, joint swelling and arthralgias.  Skin: Negative.  Neurological: Positive for headaches.  Neuropathy, chronic pain  Hematological: Negative.  Psychiatric/Behavioral: Negative.  BP 152/70  Pulse 90  Resp 18  Ht 5\' 2"  (1.575 m)  Wt 162 lb (73.483 kg)  BMI 29.62 kg/m2  SpO2 98%  Physical Exam  Constitutional: She is oriented to person, place, and time. She appears well-nourished. No distress.  HENT:  Head: Normocephalic and atraumatic.  Mouth/Throat: Oropharynx is clear and moist.  Eyes: Conjunctivae and EOM are normal. Pupils are equal, round, and reactive to light.  Neck: Normal range of motion. Neck supple. No JVD present. No tracheal deviation present. No thyromegaly present.  Cardiovascular: Normal rate, regular rhythm and intact distal pulses.  Murmur  heard. 2/6 diastolic murmur at apex  Pulmonary/Chest: Effort normal and breath sounds normal. No respiratory distress. She has no wheezes. She has no rales.  Abdominal: Soft. Bowel sounds are normal. She exhibits no distension and no mass. There is no tenderness.  Musculoskeletal: Normal range of motion. She exhibits no edema and no tenderness.  Lymphadenopathy:  She has no cervical adenopathy.  Neurological: She is alert and oriented to person, place, and time. No cranial nerve deficit or sensory deficit.  Skin: Skin is warm and dry.  Psychiatric: She has a normal mood and affect.   Diagnostic Tests:  Art Joseph Art, MD Wed Aug 24, 2012 1:14:08 PM EDT      **ADDENDUM** CREATED: 08/24/2012 13:11:59  IMA is diminutive and patent. Branch vessels are grossly patent.  **END ADDENDUM** SIGNED BY: Marlowe Aschoff. Hoss, M.D.     Study Result    *RADIOLOGY REPORT*  Clinical Data: Thoracic aortic aneurysm. Pre stent graft.  CT ANGIOGRAPHY CHEST, ABDOMEN AND PELVIS  Technique: Multidetector CT imaging through the chest, abdomen and  pelvis was performed using the standard protocol during bolus  administration of intravenous contrast. Multiplanar reconstructed  images including MIPs were obtained and reviewed to evaluate the  vascular anatomy.  Contrast: , OMNIPAQUE IOHEXOL 350 MG/ML SOLN  Comparison: 07/07/2007 and multiple other studies.  CTA CHEST  Findings: Aneurysmal dilatation of the ascending aorta has  worsened. Maximal diameters of the ascending aorta the at the  sinus of Valsalva, sinotubular junction, and ascending aorta are  3.2 cm, 3.3 cm, and 5.8 cm respectively. There is slight kinking  of the thoracic aorta at the junction between the arch and proximal  descending aorta without significant obstruction.  Innominate artery, right subclavian artery, right common carotid  artery, left common carotid artery the, and left subclavian artery  are patent. All of these vessels are  tortuous. Proximal left  subclavian artery is ectatic with maximal diameter of 15 mm.  Innominate artery is minimally ectatic at 13 mm. Right vertebral  artery is grossly patent. Left vertebral artery is also grossly  patent.  Descending aorta is nonaneurysmal and patent without significant  plaque. It is tortuous.  No obvious filling defects in the pulmonary arterial tree to  suggest acute pulmonary thromboembolism.  Borderline enlarged right paratracheal node on image 30 measures 10  mm in short axis diameter. This has improved. No pericardial  effusion.  No pneumothorax or pleural effusion.  Minimal ground-glass in the posterior upper left upper lobe on  image 24 is stable.  Left shoulder arthroplasty is in place. No definite acute rib  fracture. Stable T3 compression deformity. Stable L1 compression  compared with lateral radiographs from Sep 10, 2011.  Unremarkable thyroid gland.  Review of the MIP images confirms the above findings.  IMPRESSION:  Aneurysmal dilatation of the ascending aorta has increased from 4.5  cm to 5.8 cm as described.  Chronic findings are noted.  CTA ABDOMEN AND PELVIS  Findings: The aorta is nonaneurysmal and patent with mild diffuse  atherosclerotic calcifications. Celiac and SMA are widely patent.  Branch vessels of the celiac and SMA are grossly patent. Expected  branching anatomy is noted. Renal arteries are patent. There is  some irregularity of the mid right renal artery with a lobulated  appearance. Early fibromuscular dysplasia is not excluded.  Common, internal, and external iliac arteries are all widely patent  with mild tortuosity and mild vascular calcifications.  Venous phase images demonstrate patency of the portal, superior  mesenteric, and splenic veins. IVC and renal veins are patent.  Right gonadal vein is prominent. Left gonadal vein is prominent.  No obvious venous reflux however.  Post cholecystectomy. Diffuse hepatic steatosis.  No focal liver  mass.  Spleen, pancreas, adrenal glands are within normal limits. Chronic  changes of the kidneys. Tiny 3 mm angiomyolipoma in the lower pole  of the left kidney. See image 19 of series 7.  Uterus is absent. Bladder and adnexa are within normal limits.  Chronic changes at the pubic symphysis. No acute bony deformity.  Advanced degenerative disc disease at L5-S1 are noted.  Anterolisthesis T12 upon L1 and L4 upon L5 have a chronic  appearance. There is associated facet arthropathy at these levels.  Review of the MIP images confirms the above findings.  IMPRESSION:  No evidence of aortic aneurysm.  The right renal artery is somewhat lobulated. Early fibromuscular  dysplasia is not excluded. Correlate with a history of  uncontrolled hypertension.  Tiny angiomyolipoma in the left kidney.  Diffuse hepatic steatosis.  Original Report Authenticated By: Jolaine Click, M.D.       *[place]*                *[facility]*                      [address]                     [place], [place] [address]                         [phone]   ------------------------------------------------------------ Transthoracic Echocardiography  Patient:    Meryle, Pugmire MR #:       [id number] Study Date: 09/08/2012 Gender:     F Age:        41 Height: Weight:     73.4kg BSA: Pt. Status: Room:    PERFORMING   [place]     Evelene Croon  SONOGRAPHER  Melissa Morford, RDCS cc:  ------------------------------------------------------------ LV EF: 60% -   65%  ------------------------------------------------------------ Indications:      COPD 496.  ------------------------------------------------------------ History:   Risk factors:  Diabetes mellitus.  ------------------------------------------------------------ Study Conclusions  - Left ventricle: The cavity size was normal. Wall thickness   was normal. Systolic function was normal.  The estimated   ejection fraction was in the range of 60% to 65%. - Aortic valve: Moderate regurgitation. - Aorta: The aorta was moderately dilated. Impressions:  - There is moderate aortic insufficiency . There is a   moderate - large ascending aortic areurism. Suggest CT   angiogram of the chest to look specifically at the aorta   for further evaluation if indicated. Transthoracic echocardiography.  M-mode, complete 2D, spectral Doppler, and color Doppler.  Weight:  Weight: 73.4kg. Weight: 161.5lb.  Blood pressure:     132/62. Patient status:  Outpatient.  Location:  Echo laboratory.   ------------------------------------------------------------  ------------------------------------------------------------  Left ventricle:  The cavity size was normal. Wall thickness was normal. Systolic function was normal. The estimated ejection fraction was in the range of 60% to 65%.  ------------------------------------------------------------ Aortic valve:   Doppler:   There was no stenosis. Moderate regurgitation.  ------------------------------------------------------------ Aorta:  The aorta was moderately dilated.  ------------------------------------------------------------ Mitral valve:   The valve appears to be grossly normal. Doppler:   No significant regurgitation.    Peak gradient: 3mm Hg (D).  ------------------------------------------------------------ Left atrium:  The atrium was normal in size.  ------------------------------------------------------------ Right ventricle:  The cavity size was normal. Systolic function was normal.  ------------------------------------------------------------ Pulmonic valve:    Structurally normal valve.   Cusp separation was normal.  Doppler:  Transvalvular velocity was within the normal range.  No regurgitation.  ------------------------------------------------------------ Tricuspid valve:   Structurally normal valve.    Leaflet separation was normal.  Doppler:  Transvalvular velocity was within the normal range.  Trivial regurgitation.  ------------------------------------------------------------ Pulmonary artery:   Systolic pressure was within the normal range.  ------------------------------------------------------------ Right atrium:  The atrium was at the upper limits of normal in size.  ------------------------------------------------------------ Pericardium:  There was no pericardial effusion.  ------------------------------------------------------------ Systemic veins: Inferior vena cava: The vessel was normal in size; the respirophasic diameter changes were in the normal range (= 50%); findings are consistent with normal central venous pressure.  ------------------------------------------------------------ Post procedure conclusions Ascending Aorta:  - The aorta was moderately dilated.  ------------------------------------------------------------  2D measurements        Normal  Doppler               Normal Left ventricle                 measurements LVID ED,     45.3 mm   43-52   Main pulmonary chord, PLAX                    artery LVID ES,     35.2 mm   23-38   Pressure, S    29 mm  =30 chord, PLAX                                      Hg FS, chord,     22 %    >29     Left ventricle PLAX                           Ea, lat      9.32 cm/ ------- LVPW, ED     9.93 mm   ------  ann, tiss         s IVS/LVPW      0.9      <1.3    DP ratio, ED                      E/Ea, lat    9.38     ------- Ventricular septum             ann, tiss IVS, ED      8.92 mm   ------  DP Aorta                          Ea, med      7.57 cm/ ------- Root diam,     27 mm   ------  ann, tiss         s ED                             DP Left atrium                    E/Ea, med   11.55     ------- AP dim         27 mm   ------  ann, tiss                                DP                                Aortic  valve                                Regurg PHT    313 ms  -------                                Mitral valve                                Peak E vel   87.4 cm/ -------                                                  s                                Peak A vel   98.2 cm/ -------                                                  s                                Deceleratio   201 ms  150-230                                n time                                Peak            3 mm  -------                                gradient, D       Hg                                Peak E/A  0.9     -------                                ratio                                Tricuspid valve                                Regurg peak   220 cm/ -------                                vel               s                                Peak RV-RA     19 mm  -------                                gradient, S       Hg                                Systemic veins                                Estimated      10 mm  -------                                CVP               Hg                                Right ventricle                                Pressure, S    29 mm  <30                                                  Hg                                Sa vel, lat  15.9 cm/ -------                                ann, tiss         s                                DP   ------------------------------------------------------------ Prepared and Electronically Authenticated  by  Kristeen Miss 2014-05-08T11:47:[id number] Procedural Findings: Hemodynamics (mmHg) RA mean 3 RV 26/4 PA 21/15 PCWP mean 9 LV 130/12 AO 124/80  Oxygen saturations: PA 69% AO 90%  Cardiac Output (Fick) 6.1   Cardiac Index (Fick) 3.4            Coronary angiography: Coronary dominance: right  Left mainstem: No significant disease.   Left anterior descending (LAD): Mild luminal irregularities.    Left  circumflex (LCx): No significant disease.  There is a small to moderate ramus and a large PLOM.   Right coronary artery (RCA): No significant disease.    Aortic root angiography: 3+ aortic insufficiency.  Aneurysmal dilatation from the sinotubular junction to the proximal aortic arch.    Final Conclusions:  No significant CAD.  3+ aortic insufficiency.    Recommendations: Surgery per Dr. Laneta Simmers.  Will need to check on timing, if surgery will be delayed a significant amount of time would restart Xarelto.   Marca Ancona 09/15/2012    Impression:  She has a 5.8 cm ascending aortic aneurysm that essentially extends from the sinotubular junction to the proximal to mid aortic arch associated with moderate to severe aortic insufficiency. I have reviewed her 2-D echocardiogram and the aortic valve leaflets looked somewhat thickened and contracted with no coaptation centrally. Her aortic annulus does not appear enlarged and the aortic sinuses are not enlarged. I suspect that her aortic insufficiency is not related to the aneurysm but to a primary abnormality of the leaflets. I will evaluate them in the operating room but I suspect that her valve is not suitable for repair. She will require a hemi-arch repair using deep hypothermic circulatory arrest. I would plan to use a pericardial valve in a Dacron conduit. I discussed the operative procedure with the patient and family including alternatives, benefits and risks; including but not limited to bleeding, blood transfusion, infection, stroke, myocardial infarction, coronary anastomotic stenosis, heart block requiring a permanent pacemaker, organ dysfunction, and death.  Wandra Mannan understands and agrees to proceed.    Plan:  Biological Bentall procedure on Thursday 09/29/12.

## 2012-09-29 NOTE — Brief Op Note (Addendum)
      [address]       [place] [address]             [phone]     09/29/2012  1:08 PM  PATIENT:  Diane Abbott  78 y.o. female  PRE-OPERATIVE DIAGNOSIS:  ASCENDING AORTIC ANEURSYM/ MOD TO SEVERE AI  POST-OPERATIVE DIAGNOSIS: SAME  PROCEDURE:  Procedure(s):R+G ASCENDING AORTIC ANEURYSM WITH 30 m Hemashield graft. (Hemi-arch repair) BENTALL PROCEDURE with composite 21 mm Magna-Ease pericardial valve in a 26mm Gelweave Valsalva graft. INTRAOPERATIVE TRANSESOPHAGEAL ECHOCARDIOGRAM  SURGEON:  Surgeon(s): [name], MD  PHYSICIAN ASSISTANT: WAYNE GOLD PA-C  ANESTHESIA:   general  PATIENT CONDITION:  ICU - intubated and hemodynamically stable.  PRE-OPERATIVE WEIGHT: 74kg  COMPLICATIONS : NO KNOWN

## 2012-09-29 NOTE — Transfer of Care (Signed)
Immediate Anesthesia Transfer of Care Note  Patient: Diane Abbott  Procedure(s) Performed: Procedure(s) with comments: BENTALL PROCEDURE (N/A) - WITH CIRC ARREST INTRAOPERATIVE TRANSESOPHAGEAL ECHOCARDIOGRAM (N/A)  Patient Location: SICU  Anesthesia Type:General  Level of Consciousness: Patient remains intubated per anesthesia plan  Airway & Oxygen Therapy: Patient remains intubated per anesthesia plan and Patient placed on Ventilator (see vital sign flow sheet for setting)  Post-op Assessment: Report given to PACU RN and Post -op Vital signs reviewed and stable  Post vital signs: Reviewed and stable  Complications: No apparent anesthesia complications

## 2012-09-29 NOTE — OR Nursing (Signed)
Volunteer called @1310  to notify patient is off pump

## 2012-09-30 ENCOUNTER — Encounter (HOSPITAL_COMMUNITY): Payer: Self-pay | Admitting: Surgery

## 2012-09-30 ENCOUNTER — Inpatient Hospital Stay (HOSPITAL_COMMUNITY): Payer: Medicare Other

## 2012-09-30 LAB — PREPARE CRYOPRECIPITATE: Unit division: 0

## 2012-09-30 LAB — GLUCOSE, CAPILLARY
Glucose-Capillary: 122 mg/dL — ABNORMAL HIGH (ref 70–99)
Glucose-Capillary: 93 mg/dL (ref 70–99)
Glucose-Capillary: 101 mg/dL — ABNORMAL HIGH (ref 70–99)
Glucose-Capillary: 88 mg/dL (ref 70–99)
Glucose-Capillary: 109 mg/dL — ABNORMAL HIGH (ref 70–99)
Glucose-Capillary: 155 mg/dL — ABNORMAL HIGH (ref 70–99)
Glucose-Capillary: 110 mg/dL — ABNORMAL HIGH (ref 70–99)
Glucose-Capillary: 141 mg/dL — ABNORMAL HIGH (ref 70–99)
Glucose-Capillary: 128 mg/dL — ABNORMAL HIGH (ref 70–99)

## 2012-09-30 LAB — POCT I-STAT 3, ART BLOOD GAS (G3+)
O2 Saturation: 100 %
pCO2 arterial: 23.2 mmHg — ABNORMAL LOW (ref 35.0–45.0)
pH, Arterial: 7.612 (ref 7.350–7.450)
pO2, Arterial: 153 mmHg — ABNORMAL HIGH (ref 80.0–100.0)

## 2012-09-30 LAB — CBC
HCT: 25.7 % — ABNORMAL LOW (ref 36.0–46.0)
Hemoglobin: 7.6 g/dL — ABNORMAL LOW (ref 12.0–15.0)
MCH: 30.7 pg (ref 26.0–34.0)
MCH: 30.6 pg (ref 26.0–34.0)
MCHC: 35 g/dL (ref 30.0–36.0)
MCHC: 34.4 g/dL (ref 30.0–36.0)
MCV: 89.1 fL (ref 78.0–100.0)
RDW: 14.5 % (ref 11.5–15.5)

## 2012-09-30 LAB — POCT I-STAT, CHEM 8
Creatinine, Ser: 1 mg/dL (ref 0.50–1.10)
HCT: 21 % — ABNORMAL LOW (ref 36.0–46.0)
Hemoglobin: 7.1 g/dL — ABNORMAL LOW (ref 12.0–15.0)
Potassium: 4.4 mEq/L (ref 3.5–5.1)
Sodium: 138 mEq/L (ref 135–145)

## 2012-09-30 LAB — BASIC METABOLIC PANEL
BUN: 18 mg/dL (ref 6–23)
Calcium: 8.7 mg/dL (ref 8.4–10.5)
GFR calc Af Amer: 73 mL/min — ABNORMAL LOW (ref >90)
GFR calc non Af Amer: 63 mL/min — ABNORMAL LOW (ref >90)
Potassium: 3.8 mEq/L (ref 3.5–5.1)

## 2012-09-30 LAB — PREPARE PLATELET PHERESIS: Unit division: 0

## 2012-09-30 LAB — CREATININE, SERUM: Creatinine, Ser: 1.07 mg/dL (ref 0.50–1.10)

## 2012-09-30 LAB — PREPARE RBC (CROSSMATCH)

## 2012-09-30 MED ORDER — FLUCONAZOLE 100 MG PO TABS
100.0000 mg | ORAL_TABLET | Freq: Every day | ORAL | Status: AC
  Administered 2012-10-01 – 2012-10-04 (×4): 100 mg via ORAL
  Filled 2012-09-30 (×4): qty 1

## 2012-09-30 MED ORDER — ENOXAPARIN SODIUM 40 MG/0.4ML ~~LOC~~ SOLN
40.0000 mg | Freq: Every day | SUBCUTANEOUS | Status: DC
  Administered 2012-09-30 – 2012-10-01 (×2): 40 mg via SUBCUTANEOUS
  Filled 2012-09-30 (×3): qty 0.4

## 2012-09-30 MED ORDER — INSULIN ASPART 100 UNIT/ML ~~LOC~~ SOLN
0.0000 [IU] | SUBCUTANEOUS | Status: DC
  Administered 2012-09-30 (×2): 2 [IU] via SUBCUTANEOUS

## 2012-09-30 MED ORDER — INSULIN ASPART 100 UNIT/ML ~~LOC~~ SOLN
0.0000 [IU] | SUBCUTANEOUS | Status: DC
  Administered 2012-10-01: 2 [IU] via SUBCUTANEOUS

## 2012-09-30 MED ORDER — INSULIN ASPART 100 UNIT/ML ~~LOC~~ SOLN
0.0000 [IU] | SUBCUTANEOUS | Status: AC
  Administered 2012-09-30 (×2): 2 [IU] via SUBCUTANEOUS

## 2012-09-30 MED FILL — Potassium Chloride Inj 2 mEq/ML: INTRAVENOUS | Qty: 40 | Status: AC

## 2012-09-30 MED FILL — Sodium Bicarbonate IV Soln 8.4%: INTRAVENOUS | Qty: 50 | Status: AC

## 2012-09-30 MED FILL — Heparin Sodium (Porcine) Inj 1000 Unit/ML: INTRAMUSCULAR | Qty: 30 | Status: AC

## 2012-09-30 MED FILL — Magnesium Sulfate Inj 50%: INTRAMUSCULAR | Qty: 10 | Status: AC

## 2012-09-30 MED FILL — Electrolyte-R (PH 7.4) Solution: INTRAVENOUS | Qty: 5000 | Status: AC

## 2012-09-30 MED FILL — Mannitol IV Soln 20%: INTRAVENOUS | Qty: 500 | Status: AC

## 2012-09-30 MED FILL — Sodium Chloride IV Soln 0.9%: INTRAVENOUS | Qty: 1000 | Status: AC

## 2012-09-30 MED FILL — Sodium Chloride Irrigation Soln 0.9%: Qty: 3000 | Status: AC

## 2012-09-30 NOTE — Procedures (Signed)
Extubation Procedure Note  Patient Details:   Name: Diane Abbott DOB: [birth year] MRN: [id number]   Airway Documentation:   Pt extubated following wean protocol. Pt NIF: 36, VC 1350. Cuff leak audible when cuff deflated. Pt extubated to 2L [place] Sats 98%, RR: 19, Clear BBS. No stridor. Pt alert and able to verbalize name/DOB. Pt is resting comfortably post extubation. RT will continue to monitor.  Evaluation  O2 sats: stable throughout Complications: No apparent complications Patient did tolerate procedure well. Bilateral Breath Sounds: Clear;Diminished Suctioning: Airway Yes  Elmer Picker 09/30/2012, 2:55 AM

## 2012-09-30 NOTE — Anesthesia Postprocedure Evaluation (Signed)
  Anesthesia Post-op Note  Patient: Diane Abbott  Procedure(s) Performed: Procedure(s) with comments: BENTALL PROCEDURE (N/A) - WITH CIRC ARREST INTRAOPERATIVE TRANSESOPHAGEAL ECHOCARDIOGRAM (N/A)  Patient Location: SICU  Anesthesia Type:General  Level of Consciousness: sedated, unresponsive and Patient remains intubated per anesthesia plan  Airway and Oxygen Therapy: Patient remains intubated per anesthesia plan  Post-op Pain: none  Post-op Assessment: Post-op Vital signs reviewed  Post-op Vital Signs: stable  Complications: No apparent anesthesia complications

## 2012-09-30 NOTE — Op Note (Signed)
NAMEADDASYN, MCBREEN NO.:  [id number]  MEDICAL RECORD NO.:  [id number]  LOCATION:  2308                         FACILITY:  [facility]  PHYSICIAN:  Burna Forts, M.D.DATE OF BIRTH:  04/27/1935  DATE OF PROCEDURE:  09/29/2012 DATE OF DISCHARGE:                              OPERATIVE REPORT   INDICATIONS FOR PROCEDURE:  Ms. Ramey is a 78 year old woman who presents today for aortic aneurysm resection and repair including aortic valve replacement.  This is considered a Bentall procedure.  This is to be performed by Dr. Rito Ehrlich.  She is brought to the holding area the morning of surgery where under local anesthesia with sedation, radial arterial and pulmonary lines are inserted.  She is taken to the OR for routine induction of general anesthesia, after which the TEE probe is prepared and then passed oropharyngeally into the stomach and slightly withdrawn for imaging of the cardiac structures.  PRECARDIOPULMONARY BYPASS T EXAMINATION:   Left ventricle.  The left ventricular chamber is seen initially in the short axis view.  There is normal wall thickness and normal wall contractile pattern appreciated. There is very mild LV chamber dilatation appreciated.  Overall contractility of the left ventricular chamber in the short axis view is satisfactory.  No masses are appreciated.  Mitral valve.  The mitral valve is seen initially in the four-chamber view.  It is compliant, mobile, and coaptation occurs at  the appropriate level.  On color Doppler, there is trivial MR seen.  No regurgitant flow appreciated.  Left atrium.  This is a normal left atrial chamber.  The left atrial appendage is visualized and is without clot or thrombus.  The interatrial septum is interrogated and is intact.  Aortic valve.  The aortic valve is seen initially in the short axis view.  It is a trileaflet valve.  There is mildly dilated aortic valve itself with a mean dilatation  diameter of about 3.4 cm at the level of the aortic valve.  The cusps appear thin and normal in appearance, though they lack of coaptation centrally.  This is associated with a moderately sized aortic insufficiency jet especially seen in the long-axis view. In the view of the LVOT, the jet of aortic insufficiency fills approximately 50-55% of the LVOT tract indicating moderate to nearly severe aortic insufficiency.  Above the level of the aortic valve  the aortic root begins to dilate in symmetric fashion.   Above the level of the aortic valve 2-3 cm, the aneurysmal dilatation is appreciated further.  Measurement is approximately 5.7-5.75 cm in diameter and at its maximum withd.  This continues up to the arch area.  Right ventricle: normal chamber in size and function.  The tricuspid valve  Is normal.    Right atrium normal.  Right atrial chamber appreciated.  The patient was placed on cardiopulmonary bypass ultimately circulatory arrest of Bentall procedures carried out which is replacement of the aortic valve including the conduit for the ascending aortic portion.  De- airing maneuvers were carried out.  Rewarming is conducted and the patient is separated from cardiopulmonary bypass with the initial attempt.  POSTCARDIOPULMONARY BYPASS EXAMINATION:  Left ventricle.  Left ventricular chamber  is seen initially in the short axis view.  There was essentially flattening and some dyskinetic activity noted in the septal wall area which continues several minutes into the post bypass period. Overall, the contractile pattern remained satisfactory.  The ventricle was  paced as well.  In the area of the aortic valve could be seen the tissue valve leaflets of the replaced valve.  These were thin compliant and mobile.  There is no aortic insufficiency.  No stenosis appreciated across this valve. The struts appeared as well it could be seen easily. This appeared to be normally functioning  replaced aortic valve.  About this level, could be easily seen walls of the conduit which again appeared to be situated appropriately in a normal fashion.  The rest of the cardiac examination was as previously described.  The patient was ultimately returned to the cardiac intensive care unit in stable condition.          ______________________________ Burna Forts, M.D.     JTM/MEDQ  D:  09/29/2012  T:  09/30/2012  Job:  [id number]

## 2012-09-30 NOTE — Progress Notes (Addendum)
Patient ID: Diane Abbott, female   DOB: [birth year], 78 y.o.   MRN: [id number]      [address]       [place] [address]             [phone]                 1 Day Post-Op Procedure(s) (LRB): BENTALL PROCEDURE (N/A) INTRAOPERATIVE TRANSESOPHAGEAL ECHOCARDIOGRAM (N/A)  Total Length of Stay:  LOS: 1 day  BP 92/43  Pulse 87  Temp(Src) 98.4 F (36.9 C) (Core (Comment))  Resp 18  Ht 5\' 2"  (1.575 m)  Wt 171 lb 12.8 oz (77.928 kg)  BMI 31.41 kg/m2  SpO2 100%  .Intake/Output     05/30 0701 - 05/31 0700   P.O. 275   I.V. (mL/kg) 465.7 (6)   Blood    NG/GT    IV Piggyback 50   Total Intake(mL/kg) 790.7 (10.1)   Urine (mL/kg/hr) 770 (0.8)   Emesis/NG output    Blood    Chest Tube 130 (0.1)   Total Output 900   Net -109.3         . sodium chloride 20 mL/hr (09/29/12 1445)  . sodium chloride 20 mL/hr at 09/29/12 1900  . sodium chloride    . DOPamine Stopped (09/30/12 1200)  . lactated ringers 20 mL/hr (09/29/12 1430)  . nitroGLYCERIN Stopped (09/29/12 1430)  . phenylephrine (NEO-SYNEPHRINE) Adult infusion 30 mcg/min (09/30/12 1800)     Lab Results  Component Value Date   WBC 11.6* 09/30/2012   HGB 7.1* 09/30/2012   HCT 21.0* 09/30/2012   PLT 120* 09/30/2012   GLUCOSE 145* 09/30/2012   ALT 12 09/27/2012   AST 23 09/27/2012   NA 138 09/30/2012   K 4.4 09/30/2012   CL 103 09/30/2012   CREATININE 1.00 09/30/2012   BUN 19 09/30/2012   CO2 26 09/30/2012   TSH 2.88 01/09/2011   INR 1.34 09/29/2012   HGBA1C 5.7* 09/27/2012   Hgb was 7.6 to 7.1 tonight Still on some neo, with falling HGB and need for neo will transfusion prbc, patient agreeable, notes she had blood with both knee replacements and gallbladder   Delight Ovens MD  Beeper [phone] Office [phone] 09/30/2012 7:11 PM

## 2012-09-30 NOTE — Op Note (Signed)
[name], Abbott NO.:  [id number]  MEDICAL RECORD NO.:  [id number]  LOCATION:  2308                         FACILITY:  [facility]  PHYSICIAN:  Evelene Croon, M.D.     DATE OF BIRTH:  1935-04-18  DATE OF PROCEDURE:  09/29/2012 DATE OF DISCHARGE:                              OPERATIVE REPORT   POSTOPERATIVE DIAGNOSIS:  Severe aortic insufficiency and 6 cm ascending aortic aneurysm.  POSTOPERATIVE DIAGNOSIS:  Severe aortic insufficiency and 6 cm ascending aortic aneurysm.  OPERATIVE PROCEDURE:  Median sternotomy, extracorporeal circulation, replacement of the ascending aorta and proximal aortic arch using Diane Abbott 30- mm Hemashield tube graft under deep hypothermic circulatory arrest, and Bentall procedure using Diane Abbott composite 26 mm Gelweave Valsalva graft and Diane Abbott 21 mm Edwards pericardial Magna-Ease valve.  ATTENDING SURGEON:  Evelene Croon, M.D.  ASSISTANT:  Rowe Clack, P.Diane Abbott.-C.  ANESTHESIA:  General endotracheal.  CLINICAL HISTORY:  This patient is Diane Abbott 78 year old white Abbott with history of fibromyalgia, rheumatoid and degenerative arthritis, and history of previous DVT and pulmonary embolism, on chronic treatment with Xarelto, who is being evaluated for degenerative disease of the cervical spine by Dr. Jeral Fruit.  An MRI of the cervical spine showed Diane Abbott 5 cm ascending aortic aneurysm.  Diane Abbott CT angiogram of the chest showed Diane Abbott maximum diameter of the ascending aorta to be about 5.8 cm with the aneurysm extending from the aortic root to the midportion of the aortic arch.  The aorta had increased in size from 4.5 cm in 2009.  She was getting some chest pains and back pain, but it was difficult to tell if this was due to her aneurysm or due to her fibromyalgia and arthritis. She subsequently underwent cardiac catheterization, which showed normal coronary arteries.  There was an ascending aortic aneurysm and significant aortic insufficiency.  She also underwent Diane Abbott  2D echocardiogram that showed an ejection fraction of 60-65% with moderate aortic insufficiency and dilated aorta.  After review of all these studies and examination with the patient, it was felt that replacement of her aortic valve and ascending aorta up to the proximal arch was the best treatment to prevent further enlargement with dissection or rupture of the aorta and worsening aortic insufficiency.  I discussed the operative procedure of replacement of her aortic valve and ascending aorta using Diane Abbott composite pericardial valve and Diane Abbott Dacron conduit.  I discussed the alternatives of aortic valve repair with Diane Abbott valve sparing root replacement, although examination of valve showed there was some thickening of the valve leaflets and they appeared to be somewhat contracted.  I discussed the benefits and risks of surgery including, but not limited to, bleeding, blood transfusion, infection, stroke, myocardial infarction, heart block requiring permanent pacemaker, organ dysfunction, and death.  She understood all this and agreed to proceed.  OPERATIVE PROCEDURE:  The patient was seen in the preoperative holding area.  Proper patient and proper operation were confirmed with nursing staff and the patient after reviewing her CT scan, echocardiogram, and catheterizations.  The consent was signed by me.  Preoperative intravenous antibiotics were given.  She was taken back to the operating room and placed on the table in supine position.  After induction of general endotracheal anesthesia, Diane Abbott Foley catheter was placed in bladder using sterile technique.  Then, the chest, abdomen, and both lower extremities were prepped and draped in usual sterile manner.  TEE was performed by Dr. Sharee Holster.  This showed moderate to severe central aortic insufficiency.  The valve leaflets appeared fairly thin and pliable.  In this elderly patient, I thought that proceeding ahead with replacement of her valve would  be the best option to shorten the operative time.  Left ventricular function appeared normal.  There was no mitral regurgitation.  Then, Diane Abbott time-out was taken.  Proper patient, proper operation were confirmed with nursing and anesthesia staff.  Chest was entered through Diane Abbott median sternotomy incision.  The pericardium opened in midline. Examination of the heart showed good ventricular contractility.  The aortic root and ascending aorta were markedly enlarged with some bluish discoloration of the wall.  The aorta appeared to come back towards more normal size in the proximal aortic arch.  Then, the patient was heparinized when an adequate ACT was obtained.  The distal ascending aorta was cannulated using Diane Abbott 20-French aortic cannula for arterial inflow.  Venous outflow was achieved using Diane Abbott two-stage venous cannula for the right atrial appendage.  The patient was placed on cardiopulmonary bypass.  Diane Abbott left ventricular vent was placed through the right superior pulmonary vein and Diane Abbott retrograde cardioplegic cannula was inserted through Diane Abbott pursestring suture in the right atrium and advanced in the coronary sinus without difficulty.  The patient was then cooled to Diane Abbott rectal temperature of 18 degrees centigrade.  With her latex allergy, we could not use Diane Abbott temperature probe Foley.  While she was being cold, Diane Abbott retrograde cardioplegic cannula was inserted into the superior vena cava through Diane Abbott pursestring suture and the proximal vena cava was encircled with Diane Abbott tape to allow use of cerebral plegia.  An insulating pad was placed in the pericardium.  Temperature probe was placed in septum.  Diane Abbott 30-mm Hemashield tube graft with Diane Abbott 10-mm side arm was brought onto the operative field.  The 1/4 inch x 3/8 inch perfusion connector was inserted into the side graft and tied into place securely. The graft was cut to the appropriate blank and bevel to allow hemiarch replacement.  When the patient had just cooled to Diane Abbott rectal  temperature of 18 degrees centigrade and Diane Abbott cold for about 25 minutes, the patient was given 125 mg of Solu-Medrol as well as Diane Abbott dose of propofol to decrease the BIS to 0.  The head was packed in ice.  She was placed in Diane Abbott steep Trendelenburg position.  After 30 minutes of cooling, we reached our targeted temperature and circulatory arrest was begun.  The blood volume was not in the pump.  The superior vena cava was occluded with Diane Abbott tape and retrograde cerebral plegia was begun by perfusion.  The patient was also given dose of cold blood retrograde cardioplegia to obtain electrical silence.  Topical cooling of the heart was performed with iced saline solution.  Then, the aortic cannula was removed.  The aorta was transected just proximal to the innominate artery and beveled under the proximal portion the aortic arch to allow hemiarch replacement.  The aorta at this level appeared fairly normal size at about 3 cm.  The aortic wall appeared to be of good thickness.  Then, the Hemashield graft was anastomosed end-to- end to the undersurface of the aortic arch using continuous 3-0 Prolene suture with Diane Abbott felt strip to  reinforce the anastomosis.  The anastomosis was lightly coated with Coseal for hemostasis.  After the completion of this anastomosis, the side graft was connected to the arterial end of the perfusion circuit.  The retrograde cerebral plegia was stopped after 19 minutes.  Cardiopulmonary bypass was slowly restarted and the graft was crossclamped just proximal to the side arm perfusion graft.  Total circulatory arrest time was 21 minutes.  The patient was then rewarmed towards 20 degrees centigrade.  The distal anastomosis appeared hemostatic.  Then, attention was turned to the aortic root.  The ascending aortic aneurysm was mobilized off of the main pulmonary artery as well as the right pulmonary artery posteriorly.  The aneurysm was opened longitudinally.  This aneurysm extends  down to the level of the coronary arteries.  Examination of the valve showed that it was Diane Abbott 3-leaflet valve with mild thickening of the leaflets and slight contraction of the leaflets.  I was concerned about whether Diane Abbott valve repair would be possible.  The left and right coronary arteries were then excised from the aortic wall with Diane Abbott button of the aortic wall around them.  They were gently retracted all the way with sutures to prevent rotation or injury. Then, the native valve leaflets were excised.  The anulus was sized and Diane Abbott 21 mm Edwards pericardial Magna-Ease valve was chosen.  We did not have Diane Abbott 21 mm Perimount aortic valve.  I was concerned that Diane Abbott 23 mm Perimount valve may be too large in this very small aortic root and may end up causing problems.  While the prosthetic aortic valve was being prepared, Diane Abbott series of pledgeted 2-0 Ethibond horizontal mattress sutures were placed around the aortic anulus with the pledgets in Diane Abbott subannular location.  The prosthetic aortic valve was then placed inside Diane Abbott 26 mm Gelweave Valsalva graft, so the cuff on the valve was at the proximal end of the Valsalva portion.  Then, Diane Abbott 4-0 Prolene continuous suture was used to join the sewing cuff of the valve to the vascular graft in Diane Abbott continuous manner.  The proximal cuff on the graft was then trimmed, so there were about 3 rings left on the cuff.  Then, the valve sutures were placed through Diane Abbott strip of autologous pericardium for reinforcement and then through the proximal cuff on the graft.  The graft was then lowered into placed and the sutures tied sequentially.  This proximal anastomosis was lightly coated with Coseal to seal any needle holes. Then, the left coronary anastomosis was performed.  Diane Abbott small opening was made in the graft using an eye cautery.  The left coronary was then anastomosed to this opening using an end-to-side manner using continuous 5-0 Prolene suture.  Then, the right coronary anastomosis  was performed in Diane Abbott similar manner to the anterior portion of the graft.  Both anastomoses were lightly coated with Coseal for hemostasis.  The patient was then rewarmed to 37 degrees centigrade.  The proximal and distal grafts were then cut to the appropriate length and anastomosed in an end- to-end manner using continuous 3-0 Prolene suture.  This anastomosis was also lightly coated with Coseal for hemostasis.  Then, Diane Abbott vent catheter was inserted into the ascending graft through Diane Abbott pursestring suture to allow de-airing maneuvers.  We did insufflate the pericardium with CO2 throughout the procedure to minimize intracardiac air.  Then, after de- airing maneuvers were performed, the head was placed in Trendelenburg position and the crossclamp was removed with time of 135 minutes.  The total circulatory arrest time was only 21 minutes with 19 minutes of retrograde cerebral plegia given.  After the cross-clamp was removed, there was return of ventricular fibrillation.  The patient was defibrillated into sinus rhythm.  The anastomoses appeared grossly hemostatic.  There was some oozing from the needle holes.  Then, 2 temporary right ventricular and right atrial pacing wires placed and brought through the skin.  When the patient rewarmed to 37 degrees centigrade, she was weaned from cardiopulmonary bypass on low-dose dopamine.  Total bypass time was 204 minutes.  Cardiac function on TEE appeared good.  The aortic valve prosthesis was functioning normally. There was no mitral regurgitation.  Then, protamine was given and the venous cannulas removed.  The left ventricular vent and retrograde cardioplegia cannulas were removed.  After protamine was completed, the patient was given 2 units of platelets due to thrombocytopenia with Diane Abbott platelet count of 60,000.  This resulted in fairly good hemostasis.  The side arm aortic perfusion graft was then ligated with heavy silk ties and divided and suture  ligated with Diane Abbott 4-0 Prolene pledgeted horizontal mattress suture.  Three chest tubes were placed with 2 in the posterior pericardium on the anterior mediastinum and one on the right pleural space.  The sternum was then closed with #6 stainless steel wires.  The fascia was closed with continuous #1 Vicryl suture.  Subcutaneous tissue was closed with continuous 2-0 Vicryl and the skin with Diane Abbott 3-0 Vicryl subcuticular closure.  The sponge, needle, and instrument counts were correct according to the scrub nurse.  Dry sterile dressing was applied over the incision and around the chest tubes, which were hooked to Pleur- Evac suction.  The patient remained hemodynamically stable and transferred to the SICU in guarded, but stable condition.     Evelene Croon, M.D.     BB/MEDQ  D:  09/29/2012  T:  09/30/2012  Job:  [id number]

## 2012-09-30 NOTE — Progress Notes (Signed)
Utilization review completed.  P.J. Elyssia Strausser,RN,BSN Case Manager [phone]  

## 2012-09-30 NOTE — Progress Notes (Signed)
ABG results called to Dr. Cornelius Moras.  Per results, NIF, VC it is OK to extubate.

## 2012-09-30 NOTE — Progress Notes (Addendum)
1 Day Post-Op Procedure(s) (LRB): BENTALL PROCEDURE (N/A) INTRAOPERATIVE TRANSESOPHAGEAL ECHOCARDIOGRAM (N/A) Subjective: No complaints  Objective: Vital signs in last 24 hours: Temp:  [95.9 F (35.5 C)-97.9 F (36.6 C)] 97.5 F (36.4 C) (05/30 0700) Pulse Rate:  [79-87] 82 (05/30 0700) Cardiac Rhythm:  [-] Normal sinus rhythm (05/30 0700) Resp:  [9-28] 17 (05/30 0700) BP: (71-110)/(45-68) 110/61 mmHg (05/30 0700) SpO2:  [96 %-100 %] 99 % (05/30 0700) Arterial Line BP: (82-118)/(51-70) 105/52 mmHg (05/30 0700) FiO2 (%):  [40 %-50 %] 40 % (05/30 0140) Weight:  [77.928 kg (171 lb 12.8 oz)] 77.928 kg (171 lb 12.8 oz) (05/30 0500)  Hemodynamic parameters for last 24 hours: PAP: (16-31)/(5-14) 20/5 mmHg CO:  [2.1 L/min-2.9 L/min] 2.9 L/min CI:  [1.2 L/min/m2-1.7 L/min/m2] 1.7 L/min/m2  Intake/Output from previous day: 05/29 0701 - 05/30 0700 In: 6799.7 [I.V.:3115.2; Blood:1824.5; NG/GT:60; IV Piggyback:1800] Out: 6265 [Urine:4290; Emesis/NG output:50; Blood:1000; Chest Tube:925] Intake/Output this shift:    General appearance: alert and cooperative Neurologic: intact Heart: regular rate and rhythm, S1, S2 normal, no murmur, click, rub or gallop Lungs: clear to auscultation bilaterally Extremities: extremities normal, atraumatic, no cyanosis or edema Wound: dressing dry  Lab Results:  Recent Labs  09/29/12 2240 09/29/12 2308 09/30/12 0445  WBC 7.6  --  9.1  HGB 9.1* 12.2 9.0*  HCT 26.5* 36.0 25.7*  PLT 133*  --  132*   BMET:  Recent Labs  09/27/12 0848  09/29/12 2308 09/30/12 0445  NA 138  < > 143 139  K 3.9  < > 3.9 3.8  CL 104  --  106 105  CO2 21  --   --  26  GLUCOSE 100*  < > 141* 117*  BUN 23  --  15 18  CREATININE 1.12*  < > 0.90 0.87  CALCIUM 9.3  --   --  8.7  < > = values in this interval not displayed.  PT/INR:  Recent Labs  09/29/12 1500  LABPROT 16.3*  INR 1.34   ABG    Component Value Date/Time   PHART 7.612* 09/30/2012 0216   HCO3  23.5 09/30/2012 0216   TCO2 24 09/30/2012 0216   ACIDBASEDEF 1.0 09/29/2012 2304   O2SAT 100.0 09/30/2012 0216   CBG (last 3)   Recent Labs  09/30/12 0619 09/30/12 0703 09/30/12 0750  GLUCAP 93 101* 88   CXR:  Mild left base atelectasis.  ECG:  NSR. Prolonged QT. No acute changes.  Assessment/Plan: S/P Procedure(s) (LRB): BENTALL PROCEDURE (N/A) INTRAOPERATIVE TRANSESOPHAGEAL ECHOCARDIOGRAM (N/A) Hemodynamically stable but still on a little dop and neo. Mobilize Diuresis: wait until bp stable off dop and neo. Diabetes control: Hgb A1c was 5.7 preop. Continue CBG's and SSI See progression orders Will leave chest tubes in until later today.   LOS: 1 day    BARTLE,BRYAN K 09/30/2012

## 2012-09-30 NOTE — Clinical Documentation Improvement (Signed)
Anemia Blood Loss Clarification  THIS DOCUMENT IS NOT A PERMANENT PART OF THE MEDICAL RECORD  RESPOND TO THE THIS QUERY, FOLLOW THE INSTRUCTIONS BELOW:  1. If needed, update documentation for the patient's encounter via the notes activity.  2. Access this query again and click edit on the In [employer].  3. After updating, or not, click F2 to complete all highlighted (required) fields concerning your review. Select "additional documentation in the medical record" OR "no additional documentation provided".  4. Click Sign note button.  5. The deficiency will fall out of your In Basket *Please let us know if you are not able to complete this workflow by phone or e-mail (listed below).        09/30/12  Dear Dr. Laneta Simmers Marton Redwood,  In an effort to better capture your patient's severity of illness, reflect appropriate length of stay and utilization of resources, a review of the patient medical record has revealed the following indicators.    Based on your clinical judgment, please clarify and document in a progress note and/or discharge summary the clinical condition associated with the following supporting information:  In responding to this query please exercise your independent judgment.  The fact that a query is asked, does not imply that any particular answer is desired or expected.   Possible Clinical Conditions?   " Expected Acute Blood Loss Anemia  " Acute Blood Loss Anemia  " Acute on chronic blood loss anemia  " Chronic blood loss anemia  " Precipitous drop in Hematocrit  " Other Condition  " Cannot Clinically Determine    Risk Factors:  EBL: per 5/29 Anesthesia record.   Diagnostics: H&H on 5/29:  11.6/34.0 H&H after TX:   7.1/21.0  Treatments: Transfusion: 5/29:  PRBC  IV fluids / plasma expanders: Per 5/29 Anesthesia record: Cell saver: ; Albumin:  ; plts pheres:  612; LR:  Serial H&H monitoring Medications (Fe,  Procrit)  Reviewed:  no additional documentation provided  Thank You,  Heywood Footman, BSN,  Clinical Documentation Specialist:  Phone: [phone]  Health Information Management [place]

## 2012-10-01 ENCOUNTER — Inpatient Hospital Stay (HOSPITAL_COMMUNITY): Payer: Medicare Other

## 2012-10-01 LAB — CBC
Hemoglobin: 8.7 g/dL — ABNORMAL LOW (ref 12.0–15.0)
MCHC: 34 g/dL (ref 30.0–36.0)
WBC: 9.6 10*3/uL (ref 4.0–10.5)

## 2012-10-01 LAB — BASIC METABOLIC PANEL
BUN: 19 mg/dL (ref 6–23)
GFR calc Af Amer: 56 mL/min — ABNORMAL LOW (ref >90)
GFR calc non Af Amer: 48 mL/min — ABNORMAL LOW (ref >90)
Potassium: 4 mEq/L (ref 3.5–5.1)
Sodium: 138 mEq/L (ref 135–145)

## 2012-10-01 LAB — GLUCOSE, CAPILLARY
Glucose-Capillary: 137 mg/dL — ABNORMAL HIGH (ref 70–99)
Glucose-Capillary: 96 mg/dL (ref 70–99)

## 2012-10-01 NOTE — Progress Notes (Signed)
Patient ID: Diane Abbott, female   DOB: [birth year], 78 y.o.   MRN: [id number]      [address]       [place] [address]             [phone]                 2 Days Post-Op Procedure(s) (LRB): BENTALL PROCEDURE (N/A) INTRAOPERATIVE TRANSESOPHAGEAL ECHOCARDIOGRAM (N/A)  Total Length of Stay:  LOS: 2 days  BP 105/51  Pulse 80  Temp(Src) 97.9 F (36.6 C) (Oral)  Resp 14  Ht 5\' 2"  (1.575 m)  Wt 168 lb 6.9 oz (76.4 kg)  BMI 30.8 kg/m2  SpO2 99%  .Intake/Output     05/31 0701 - 06/01 0700   P.O. 600   I.V. (mL/kg) 512.1 (6.7)   IV Piggyback    Total Intake(mL/kg) 1112.1 (14.6)   Urine (mL/kg/hr) 260 (0.3)   Chest Tube 150 (0.2)   Total Output 410   Net +702.1         . sodium chloride 20 mL/hr (09/29/12 1445)  . sodium chloride 20 mL/hr at 09/29/12 1900  . sodium chloride    . DOPamine Stopped (09/30/12 1200)  . lactated ringers 20 mL/hr (09/29/12 1430)  . nitroGLYCERIN Stopped (09/29/12 1430)  . phenylephrine (NEO-SYNEPHRINE) Adult infusion Stopped (10/01/12 1400)     Lab Results  Component Value Date   WBC 9.6 10/01/2012   HGB 8.7* 10/01/2012   HCT 25.6* 10/01/2012   PLT 95* 10/01/2012   GLUCOSE 127* 10/01/2012   ALT 12 09/27/2012   AST 23 09/27/2012   NA 138 10/01/2012   K 4.0 10/01/2012   CL 104 10/01/2012   CREATININE 1.09 10/01/2012   BUN 19 10/01/2012   CO2 27 10/01/2012   TSH 2.88 01/09/2011   INR 1.34 09/29/2012   HGBA1C 5.7* 09/27/2012   Stable day, off drips   Delight Ovens MD  Beeper [phone] Office [phone] 10/01/2012 7:23 PM

## 2012-10-01 NOTE — Progress Notes (Signed)
Patient ID: IOWA KAPPES, female   DOB: [birth year], 78 y.o.   MRN: [id number] TCTS DAILY PROGRESS NOTE                   [address]            [place] [address]          [phone]      2 Days Post-Op Procedure(s) (LRB): BENTALL PROCEDURE (N/A) INTRAOPERATIVE TRANSESOPHAGEAL ECHOCARDIOGRAM (N/A)  Total Length of Stay:  LOS: 2 days   Subjective: Up in chair , neuro intact  Objective: Vital signs in last 24 hours: Temp:  [97.7 F (36.5 C)-98.4 F (36.9 C)] 97.7 F (36.5 C) (05/31 0746) Pulse Rate:  [74-98] 74 (05/31 0800) Cardiac Rhythm:  [-] Normal sinus rhythm (05/31 0800) Resp:  [10-26] 11 (05/31 0800) BP: (75-118)/(37-67) 108/51 mmHg (05/31 0800) SpO2:  [88 %-100 %] 100 % (05/31 0800) Arterial Line BP: (84-127)/(36-66) 113/52 mmHg (05/31 0800) FiO2 (%):  [1 %-2 %] 1 % (05/30 1400) Weight:  [168 lb 6.9 oz (76.4 kg)] 168 lb 6.9 oz (76.4 kg) (05/31 0500)  Filed Weights   09/28/12 1300 09/30/12 0500 10/01/12 0500  Weight: 165 lb (74.844 kg) 171 lb 12.8 oz (77.928 kg) 168 lb 6.9 oz (76.4 kg)    Weight change: -3 lb 5.9 oz (-1.528 kg)   Hemodynamic parameters for last 24 hours: PAP: (25-40)/(11-26) 28/13 mmHg  Intake/Output from previous day: 05/30 0701 - 05/31 0700 In: 1310.7 [P.O.:275; I.V.:985.7; IV Piggyback:50] Out: 1940 [Urine:1670; Chest Tube:270]  Intake/Output this shift: Total I/O In: 498.2 [P.O.:240; I.V.:258.2] Out: -   Current Meds: Scheduled Meds: . acetaminophen  1,000 mg Oral Q6H   Or  . acetaminophen (TYLENOL) oral liquid 160 mg/5 mL  975 mg Per Tube Q6H  . bisacodyl  10 mg Oral Daily   Or  . bisacodyl  10 mg Rectal Daily  . Chlorhexidine Gluconate Cloth  6 each Topical Q0600  . docusate sodium  200 mg Oral Daily  . enoxaparin (LOVENOX) injection  40 mg Subcutaneous QHS  . fluconazole  100 mg Oral Daily  . insulin aspart  0-24 Units Subcutaneous Q4H  . insulin aspart  0-24 Units Subcutaneous Q4H  . mupirocin  ointment  1 application Nasal BID  . pantoprazole  40 mg Oral Daily  . phenazopyridine  100 mg Oral TID WC  . rOPINIRole  2 mg Oral TID  . sodium chloride  3 mL Intravenous Q12H   Continuous Infusions: . sodium chloride 20 mL/hr (09/29/12 1445)  . sodium chloride 20 mL/hr at 09/29/12 1900  . sodium chloride    . DOPamine Stopped (09/30/12 1200)  . lactated ringers 20 mL/hr (09/29/12 1430)  . nitroGLYCERIN Stopped (09/29/12 1430)  . phenylephrine (NEO-SYNEPHRINE) Adult infusion 10 mcg/min (10/01/12 0800)   PRN Meds:.fentaNYL, metoprolol, ondansetron (ZOFRAN) IV, oxyCODONE, OxyCODONE, sodium chloride  General appearance: alert and cooperative Neurologic: intact Heart: friction rub heard . Lungs: clear to auscultation bilaterally Abdomen: soft, non-tender; bowel sounds normal; no masses,  no organomegaly Extremities: extremities normal, atraumatic, no cyanosis or edema Wound: steble sternum  Lab Results: CBC: Recent Labs  09/30/12 1700 09/30/12 1748 10/01/12 0405  WBC 11.6*  --  9.6  HGB 7.6* 7.1* 8.7*  HCT 22.1* 21.0* 25.6*  PLT 120*  --  95*   BMET:  Recent Labs  09/30/12 0445  09/30/12 1748 10/01/12 0405  NA 139  --  138 138  K 3.8  --  4.4 4.0  CL 105  --  103 104  CO2 26  --   --  27  GLUCOSE 117*  --  145* 127*  BUN 18  --  19 19  CREATININE 0.87  < > 1.00 1.09  CALCIUM 8.7  --   --  8.4  < > = values in this interval not displayed.  PT/INR:  Recent Labs  09/29/12 1500  LABPROT 16.3*  INR 1.34   Radiology: Dg Chest Port 1 View  10/01/2012   *RADIOLOGY REPORT*  Clinical Data: Post cardiac surgery  PORTABLE CHEST - 1 VIEW  Comparison:   the previous day's study  Findings: Interval retraction of the Swan-Ganz catheter leaving the right IJ venous sheath in place.  Mediastinal drains and right chest tube stable.  No pneumothorax.  Left shoulder arthroplasty hardware unchanged.  Small left pleural effusion. Atelectasis/consolidation at the left lung base as  before.  IMPRESSION:  1.  Removal of Swan-Ganz catheter.  Otherwise stable appearance since previous portable film   Original Report Authenticated By: D. Andria Rhein, MD      Assessment/Plan: S/P Procedure(s) (LRB): BENTALL PROCEDURE (N/A) INTRAOPERATIVE TRANSESOPHAGEAL ECHOCARDIOGRAM (N/A) D/c a line and chest tubes Expected Acute  Blood - loss Anemia now 8.7 after blood last night Pt consult  History of acute unary retentuion, leave foley one more day until gets up     Keitra Carusone B 10/01/2012 10:12 AM

## 2012-10-02 ENCOUNTER — Inpatient Hospital Stay (HOSPITAL_COMMUNITY): Payer: Medicare Other

## 2012-10-02 LAB — TYPE AND SCREEN
Antibody Screen: NEGATIVE
Unit division: 0

## 2012-10-02 LAB — BASIC METABOLIC PANEL
BUN: 21 mg/dL (ref 6–23)
CO2: 29 mEq/L (ref 19–32)
Calcium: 8.2 mg/dL — ABNORMAL LOW (ref 8.4–10.5)
Chloride: 104 mEq/L (ref 96–112)
Creatinine, Ser: 1.03 mg/dL (ref 0.50–1.10)
GFR calc Af Amer: 60 mL/min — ABNORMAL LOW (ref >90)
GFR calc non Af Amer: 51 mL/min — ABNORMAL LOW (ref >90)
Glucose, Bld: 98 mg/dL (ref 70–99)
Potassium: 4.2 mEq/L (ref 3.5–5.1)
Sodium: 136 mEq/L (ref 135–145)

## 2012-10-02 LAB — CBC
HCT: 25.1 % — ABNORMAL LOW (ref 36.0–46.0)
Hemoglobin: 8.3 g/dL — ABNORMAL LOW (ref 12.0–15.0)
MCH: 29.6 pg (ref 26.0–34.0)
MCHC: 33.1 g/dL (ref 30.0–36.0)
MCV: 89.6 fL (ref 78.0–100.0)
Platelets: 74 10*3/uL — ABNORMAL LOW (ref 150–400)
RBC: 2.8 MIL/uL — ABNORMAL LOW (ref 3.87–5.11)
RDW: 17.1 % — ABNORMAL HIGH (ref 11.5–15.5)
WBC: 6 10*3/uL (ref 4.0–10.5)

## 2012-10-02 LAB — GLUCOSE, CAPILLARY
Glucose-Capillary: 88 mg/dL (ref 70–99)
Glucose-Capillary: 94 mg/dL (ref 70–99)
Glucose-Capillary: 110 mg/dL — ABNORMAL HIGH (ref 70–99)
Glucose-Capillary: 106 mg/dL — ABNORMAL HIGH (ref 70–99)

## 2012-10-02 MED ORDER — SODIUM CHLORIDE 0.9 % IJ SOLN
3.0000 mL | Freq: Two times a day (BID) | INTRAMUSCULAR | Status: DC
  Administered 2012-10-02 – 2012-10-04 (×4): 3 mL via INTRAVENOUS

## 2012-10-02 MED ORDER — DOCUSATE SODIUM 100 MG PO CAPS
200.0000 mg | ORAL_CAPSULE | Freq: Every day | ORAL | Status: DC
  Administered 2012-10-02 – 2012-10-04 (×3): 200 mg via ORAL
  Filled 2012-10-02 (×3): qty 2

## 2012-10-02 MED ORDER — PANTOPRAZOLE SODIUM 40 MG PO TBEC
40.0000 mg | DELAYED_RELEASE_TABLET | Freq: Every day | ORAL | Status: DC
  Administered 2012-10-03 – 2012-10-05 (×3): 40 mg via ORAL
  Filled 2012-10-02 (×3): qty 1

## 2012-10-02 MED ORDER — MOVING RIGHT ALONG BOOK
Freq: Once | Status: DC
  Filled 2012-10-02 (×2): qty 1

## 2012-10-02 MED ORDER — OXYCODONE HCL 5 MG PO TABS
5.0000 mg | ORAL_TABLET | Freq: Four times a day (QID) | ORAL | Status: DC | PRN
  Administered 2012-10-02 – 2012-10-03 (×4): 10 mg via ORAL
  Administered 2012-10-04 (×2): 5 mg via ORAL
  Administered 2012-10-04: 10 mg via ORAL
  Administered 2012-10-05 (×2): 5 mg via ORAL
  Filled 2012-10-02 (×2): qty 1
  Filled 2012-10-02: qty 2
  Filled 2012-10-02: qty 1
  Filled 2012-10-02 (×2): qty 2
  Filled 2012-10-02: qty 1
  Filled 2012-10-02 (×2): qty 2

## 2012-10-02 MED ORDER — BISACODYL 10 MG RE SUPP: 10.0000 mg | Freq: Every day | RECTAL | Status: DC | PRN

## 2012-10-02 MED ORDER — INSULIN ASPART 100 UNIT/ML ~~LOC~~ SOLN: 0.0000 [IU] | Freq: Three times a day (TID) | SUBCUTANEOUS | Status: DC

## 2012-10-02 MED ORDER — SODIUM CHLORIDE 0.9 % IJ SOLN: 3.0000 mL | INTRAMUSCULAR | Status: DC | PRN

## 2012-10-02 MED ORDER — ENOXAPARIN SODIUM 30 MG/0.3ML ~~LOC~~ SOLN
30.0000 mg | Freq: Every day | SUBCUTANEOUS | Status: DC
  Administered 2012-10-02 – 2012-10-03 (×2): 30 mg via SUBCUTANEOUS
  Filled 2012-10-02 (×3): qty 0.3

## 2012-10-02 MED ORDER — SODIUM CHLORIDE 0.9 % IV SOLN: 250.0000 mL | INTRAVENOUS | Status: DC | PRN

## 2012-10-02 MED ORDER — BISACODYL 5 MG PO TBEC
10.0000 mg | DELAYED_RELEASE_TABLET | Freq: Every day | ORAL | Status: DC | PRN
  Administered 2012-10-02: 10 mg via ORAL

## 2012-10-02 NOTE — Progress Notes (Signed)
Patient ID: SHAILA GILCHREST, female   DOB: 1935-02-13, 78 y.o.   MRN: [id number] TCTS DAILY PROGRESS NOTE                   [address]            [place] [address]          [phone]      3 Days Post-Op Procedure(s) (LRB): BENTALL PROCEDURE (N/A) INTRAOPERATIVE TRANSESOPHAGEAL ECHOCARDIOGRAM (N/A)  Total Length of Stay:  LOS: 3 days   Subjective: Up in chair , walked 300 feet this am   Objective: Vital signs in last 24 hours: Temp:  [97.8 F (36.6 C)-98.4 F (36.9 C)] 98.2 F (36.8 C) (06/01 0400) Pulse Rate:  [73-85] 80 (06/01 0600) Cardiac Rhythm:  [-] Normal sinus rhythm (06/01 0400) Resp:  [11-18] 11 (06/01 0600) BP: (83-113)/(34-59) 93/50 mmHg (06/01 0600) SpO2:  [86 %-100 %] 97 % (06/01 0600) Arterial Line BP: (101-107)/(44-49) 107/49 mmHg (05/31 1000) Weight:  [168 lb 14 oz (76.6 kg)] 168 lb 14 oz (76.6 kg) (06/01 0600)  Filed Weights   09/30/12 0500 10/01/12 0500 10/02/12 0600  Weight: 171 lb 12.8 oz (77.928 kg) 168 lb 6.9 oz (76.4 kg) 168 lb 14 oz (76.6 kg)    Weight change: 7.1 oz (0.2 kg)      Intake/Output from previous day: 05/31 0701 - 06/01 0700 In: 1332.1 [P.O.:600; I.V.:732.1] Out: 1045 [Urine:745; Chest Tube:300]  Intake/Output this shift:    Current Meds: Scheduled Meds: . acetaminophen  1,000 mg Oral Q6H   Or  . acetaminophen (TYLENOL) oral liquid 160 mg/5 mL  975 mg Per Tube Q6H  . bisacodyl  10 mg Oral Daily   Or  . bisacodyl  10 mg Rectal Daily  . Chlorhexidine Gluconate Cloth  6 each Topical Q0600  . docusate sodium  200 mg Oral Daily  . enoxaparin (LOVENOX) injection  40 mg Subcutaneous QHS  . fluconazole  100 mg Oral Daily  . insulin aspart  0-24 Units Subcutaneous Q4H  . insulin aspart  0-24 Units Subcutaneous Q4H  . mupirocin ointment  1 application Nasal BID  . pantoprazole  40 mg Oral Daily  . rOPINIRole  2 mg Oral TID  . sodium chloride  3 mL Intravenous Q12H   Continuous Infusions: . sodium  chloride 20 mL/hr (09/29/12 1445)  . sodium chloride 20 mL/hr at 09/29/12 1900  . sodium chloride    . DOPamine Stopped (09/30/12 1200)  . lactated ringers 20 mL/hr (09/29/12 1430)  . nitroGLYCERIN Stopped (09/29/12 1430)  . phenylephrine (NEO-SYNEPHRINE) Adult infusion Stopped (10/01/12 1400)   PRN Meds:.fentaNYL, metoprolol, ondansetron (ZOFRAN) IV, oxyCODONE, OxyCODONE, sodium chloride  General appearance: alert and cooperative Neurologic: intact Heart: friction rub heard . Lungs: clear to auscultation bilaterally Abdomen: soft, non-tender; bowel sounds normal; no masses,  no organomegaly Extremities: extremities normal, atraumatic, no cyanosis or edema Wound:ssternum stable  Lab Results: CBC:  Recent Labs  10/01/12 0405 10/02/12 0350  WBC 9.6 6.0  HGB 8.7* 8.3*  HCT 25.6* 25.1*  PLT 95* 74*   BMET:   Recent Labs  10/01/12 0405 10/02/12 0350  NA 138 136  K 4.0 4.2  CL 104 104  CO2 27 29  GLUCOSE 127* 98  BUN 19 21  CREATININE 1.09 1.03  CALCIUM 8.4 8.2*    PT/INR:   Recent Labs  09/29/12 1500  LABPROT 16.3*  INR 1.34   Radiology: Dg Chest [facility] [address]  10/01/2012   *RADIOLOGY REPORT*  Clinical Data: Post cardiac surgery  PORTABLE CHEST - 1 VIEW  Comparison:   the previous day's study  Findings: Interval retraction of the Swan-Ganz catheter leaving the right IJ venous sheath in place.  Mediastinal drains and right chest tube stable.  No pneumothorax.  Left shoulder arthroplasty hardware unchanged.  Small left pleural effusion. Atelectasis/consolidation at the left lung base as before.  IMPRESSION:  1.  Removal of Swan-Ganz catheter.  Otherwise stable appearance since previous portable film   Original Report Authenticated By: D. Andria Rhein, MD      Assessment/Plan: S/P Procedure(s) (LRB): BENTALL PROCEDURE (N/A) INTRAOPERATIVE TRANSESOPHAGEAL ECHOCARDIOGRAM (N/A) D/c a line and chest tubes Expected Acute  Blood - loss Anemia now 8.3 after blood two  days ago Pt consult  D/c foley today To circle if beds  Mandeep Ferch B 10/02/2012 8:25 AM

## 2012-10-02 NOTE — Progress Notes (Signed)
Patient transferred from Unit 2300 to [address] 2000, after report was given to receiving RN, Nego.  Patient's chart, medications, and personal belongings all sent with patient.  Sister present during transfer.  Keitha Butte, RN

## 2012-10-02 NOTE — Evaluation (Signed)
Physical Therapy Evaluation Patient Details Name: Diane Abbott MRN: [id number] DOB: 03-Feb-1935 Today's Date: 10/02/2012 Time: 1100-1119 PT Time Calculation (min): 19 min  PT Assessment / Plan / Recommendation Clinical Impression  Patient is a 78 y/o female admitted with 5 cm ascending aortic aneurysm now s/p Bentall procedure with h/o fibromyalgia, rheumatoid and degenerative arthritis, possible familial hypercoagulable state with hx of DVT and PE on chronic Xarelto.  She presents to PT with decreased mobility secondary to sternal precautions, decreased activity tolerance, decreased balance, and decreased strength.  She will benefit from skilled PT in the acute setting to allow return home with supervision level assist.   Would also benefit from OT assessment of ADL's.    PT Assessment  Patient needs continued PT services    Follow Up Recommendations  Home health PT;Supervision - Intermittent       Barriers to Discharge Decreased caregiver support      Equipment Recommendations  None recommended by PT    Recommendations for Other Services OT consult   Frequency Min 3X/week    Precautions / Restrictions Precautions Precautions: Fall;Sternal Restrictions Weight Bearing Restrictions:  (sternal precautions)   Pertinent Vitals/Pain Min c/o chest soreness      Mobility  Bed Mobility Bed Mobility: Not assessed Details for Bed Mobility Assistance: pt up in bathroom with nursing upon entry preparing for transfer to 2000 Transfers Transfers: Sit to Stand;Stand to Sit Sit to Stand: 4: Min guard;From chair/3-in-1 Stand to Sit: 4: Min guard;To chair/3-in-1 Details for Transfer Assistance: stood from 3:1 over commode, cues for sternal precautions Ambulation/Gait Ambulation/Gait Assistance: 4: Min assist Ambulation Distance (Feet): 150 Feet Assistive device: 1 person hand held assist Gait Pattern: Step-through pattern;Decreased hip/knee flexion - right;Decreased hip/knee flexion -  left;Decreased stride length        PT Diagnosis: Abnormality of gait;Generalized weakness  PT Problem List: Decreased strength;Decreased activity tolerance;Decreased balance;Decreased mobility;Cardiopulmonary status limiting activity PT Treatment Interventions: Gait training;DME instruction;Stair training;Balance training;Patient/family education;Therapeutic activities;Therapeutic exercise;Functional mobility training   PT Goals Acute Rehab PT Goals PT Goal Formulation: With patient Time For Goal Achievement: 10/16/12 Potential to Achieve Goals: Good Pt will Roll Supine to Right Side: with modified independence PT Goal: Rolling Supine to Right Side - Progress: Goal set today Pt will go Supine/Side to Sit: with modified independence PT Goal: Supine/Side to Sit - Progress: Goal set today Pt will go Sit to Supine/Side: with modified independence PT Goal: Sit to Supine/Side - Progress: Goal set today Pt will go Sit to Stand: with modified independence PT Goal: Sit to Stand - Progress: Goal set today Pt will Stand: with modified independence;3 - 5 min;with unilateral upper extremity support PT Goal: Stand - Progress: Goal set today Pt will Ambulate: >150 feet;with modified independence;with least restrictive assistive device PT Goal: Ambulate - Progress: Goal set today Pt will Go Up / Down Stairs: 3-5 stairs;with supervision;with rail(s) PT Goal: Up/Down Stairs - Progress: Goal set today Additional Goals Additional Goal #1: Patient to demonstrate mobility maintaining sternal precautions without cues at lest 70% of the time. PT Goal: Additional Goal #1 - Progress: Goal set today  Visit Information  Last PT Received On: 10/02/12 Assistance Needed: +1    Subjective Data  Subjective: Do you think I can manage at home? Patient Stated Goal: To go home   Prior Functioning  Home Living Lives With: Daughter Available Help at Discharge: Available 24 hours/day;Family (for supervision help  only; daughter sick too) Type of Home: House Home Access: Stairs to  enter Entrance Stairs-Number of Steps: 3 Entrance Stairs-Rails: Left;Right Home Layout: Two level;Able to live on main level with bedroom/bathroom Alternate Level Stairs-Number of Steps: stays on main level Bathroom Shower/Tub: Health visitor: Handicapped height Bathroom Accessibility: Yes How Accessible: Accessible via walker;Accessible via wheelchair Home Adaptive Equipment: Built-in shower seat;Grab bars in shower;Grab bars around toilet;Walker - rolling;Straight cane Prior Function Level of Independence: Independent with assistive device(s) Driving: Yes Comments: sister will be able to help also initially couple of days Communication Communication: No difficulties    Cognition  Cognition Arousal/Alertness: Awake/alert Behavior During Therapy: WFL for tasks assessed/performed Overall Cognitive Status: Within Functional Limits for tasks assessed    Extremity/Trunk Assessment Right Lower Extremity Assessment RLE ROM/Strength/Tone: [facility] for tasks assessed Left Lower Extremity Assessment LLE ROM/Strength/Tone: [facility] for tasks assessed   Balance Balance Balance Assessed: Yes Static Standing Balance Static Standing - Balance Support: Left upper extremity supported Static Standing - Level of Assistance: 4: Min assist  End of Session PT - End of Session Equipment Utilized During Treatment: Gait belt Activity Tolerance: Patient limited by fatigue (increased respirations walking on room air) Patient left: in chair;with call bell/phone within reach;with family/visitor present  GP     [facility] 10/02/2012, 12:31 PM Sheran Lawless, PT [phone] 10/02/2012

## 2012-10-02 NOTE — Plan of Care (Signed)
Problem: Phase III Progression Outcomes Goal: Time patient transferred to PCTU/Telemetry POD Outcome: Completed/Met Date Met:  10/02/12 1130

## 2012-10-03 ENCOUNTER — Inpatient Hospital Stay (HOSPITAL_COMMUNITY): Payer: Medicare Other

## 2012-10-03 LAB — CBC
HCT: 26.9 % — ABNORMAL LOW (ref 36.0–46.0)
Hemoglobin: 8.9 g/dL — ABNORMAL LOW (ref 12.0–15.0)
MCH: 29.7 pg (ref 26.0–34.0)
MCHC: 33.1 g/dL (ref 30.0–36.0)
MCV: 89.7 fL (ref 78.0–100.0)
Platelets: 81 10*3/uL — ABNORMAL LOW (ref 150–400)
RBC: 3 MIL/uL — ABNORMAL LOW (ref 3.87–5.11)
RDW: 16.1 % — ABNORMAL HIGH (ref 11.5–15.5)
WBC: 4.6 10*3/uL (ref 4.0–10.5)

## 2012-10-03 LAB — BASIC METABOLIC PANEL
BUN: 17 mg/dL (ref 6–23)
CO2: 30 mEq/L (ref 19–32)
Calcium: 8.7 mg/dL (ref 8.4–10.5)
Chloride: 101 mEq/L (ref 96–112)
Creatinine, Ser: 0.89 mg/dL (ref 0.50–1.10)
GFR calc Af Amer: 71 mL/min — ABNORMAL LOW (ref >90)
GFR calc non Af Amer: 61 mL/min — ABNORMAL LOW (ref >90)
Glucose, Bld: 105 mg/dL — ABNORMAL HIGH (ref 70–99)
Potassium: 3.6 mEq/L (ref 3.5–5.1)
Sodium: 137 mEq/L (ref 135–145)

## 2012-10-03 NOTE — Progress Notes (Signed)
4 Days Post-Op Procedure(s) (LRB): BENTALL PROCEDURE (N/A) INTRAOPERATIVE TRANSESOPHAGEAL ECHOCARDIOGRAM (N/A) Subjective: Some back pain but otherwise feels well. Wants a regular diet.  Objective: Vital signs in last 24 hours: Temp:  [97.8 F (36.6 C)-98.9 F (37.2 C)] 98.9 F (37.2 C) (06/02 0407) Pulse Rate:  [76-92] 86 (06/02 0407) Cardiac Rhythm:  [-] Normal sinus rhythm (06/01 2014) Resp:  [9-42] 20 (06/02 0407) BP: (99-127)/(51-97) 107/68 mmHg (06/02 0407) SpO2:  [91 %-100 %] 92 % (06/02 0407) Weight:  [74.072 kg (163 lb 4.8 oz)] 74.072 kg (163 lb 4.8 oz) (06/02 0407)  Hemodynamic parameters for last 24 hours:    Intake/Output from previous day: 06/01 0701 - 06/02 0700 In: 500 [P.O.:480; I.V.:20] Out: 1300 [Urine:1300] Intake/Output this shift:    General appearance: alert and cooperative Neurologic: intact Heart: regular rate and rhythm, S1, S2 normal, no murmur, click, rub or gallop Lungs: clear to auscultation bilaterally Extremities: extremities normal, atraumatic, no cyanosis or edema Wound: incision ok  Lab Results:  Recent Labs  10/02/12 0350 10/03/12 0400  WBC 6.0 4.6  HGB 8.3* 8.9*  HCT 25.1* 26.9*  PLT 74* 81*   BMET:  Recent Labs  10/02/12 0350 10/03/12 0400  NA 136 137  K 4.2 3.6  CL 104 101  CO2 29 30  GLUCOSE 98 105*  BUN 21 17  CREATININE 1.03 0.89  CALCIUM 8.2* 8.7    PT/INR: No results found for this basename: LABPROT, INR,  in the last 72 hours ABG    Component Value Date/Time   PHART 7.612* 09/30/2012 0216   HCO3 23.5 09/30/2012 0216   TCO2 26 09/30/2012 1748   ACIDBASEDEF 1.0 09/29/2012 2304   O2SAT 100.0 09/30/2012 0216   CBG (last 3)   Recent Labs  10/02/12 1611 10/02/12 2135 10/03/12 0552  GLUCAP 110* 106* 93    Assessment/Plan: S/P Procedure(s) (LRB): BENTALL PROCEDURE (N/A) INTRAOPERATIVE TRANSESOPHAGEAL ECHOCARDIOGRAM (N/A) Overall she is doing very well pod 4 after surgery. CBG's have been normal. Will  switch diet to regular. Mobilize IS She is not sure if she wants to go home with her daughter who is not in good health or go to SNF.She will probably be ready tomorrow. Will have care management see her.    LOS: 4 days    BARTLE,BRYAN K 10/03/2012

## 2012-10-03 NOTE — Progress Notes (Signed)
Pt ambulated approx. 150 feet with assistance. Pt tolerated well.

## 2012-10-03 NOTE — Progress Notes (Signed)
Physical Therapy Treatment Patient Details Name: Diane Abbott MRN: [id number] DOB: 07-17-1934 Today's Date: 10/03/2012 Time: 1456-1510 PT Time Calculation (min): 14 min  PT Assessment / Plan / Recommendation Comments on Treatment Session  Pt s/p AAA repaiir with decr mobility secondary to decr endurance and decr balance.  Progressing well and pt feels that she will be able to manage at home with sisters help prn.      Follow Up Recommendations  Home health PT;Supervision - Intermittent                 Equipment Recommendations  None recommended by PT        Frequency Min 3X/week   Plan Discharge plan remains appropriate;Frequency remains appropriate    Precautions / Restrictions Precautions Precautions: Fall;Sternal   Pertinent Vitals/Pain VSS, no pain    Mobility  Bed Mobility Bed Mobility: Supine to Sit;Sitting - Scoot to Edge of Bed Supine to Sit: 4: Min guard Sitting - Scoot to [place] of Bed: 4: Min guard Details for Bed Mobility Assistance: cues for sternal precautions Transfers Transfers: Sit to Stand Sit to Stand: 5: Supervision;Without upper extremity assist;From bed Stand to Sit: 5: Supervision;Without upper extremity assist;To bed Details for Transfer Assistance: cues for sternal precautions Ambulation/Gait Ambulation/Gait Assistance: 5: Supervision Ambulation Distance (Feet): 500 Feet Assistive device: Rolling walker Ambulation/Gait Assistance Details: Pt ambulated with RW with only occasional cues needed for staying close to RW Gait Pattern: Step-through pattern;Decreased hip/knee flexion - right;Decreased hip/knee flexion - left;Decreased stride length Gait velocity: decreased Stairs: No Wheelchair Mobility Wheelchair Mobility: No    PT Goals Acute Rehab PT Goals Pt will go Supine/Side to Sit: with modified independence PT Goal: Supine/Side to Sit - Progress: Progressing toward goal Pt will go Sit to Stand: with modified independence PT Goal: Sit  to Stand - Progress: Progressing toward goal Pt will Stand: with modified independence;3 - 5 min;with unilateral upper extremity support PT Goal: Stand - Progress: Progressing toward goal Pt will Ambulate: >150 feet;with modified independence;with least restrictive assistive device PT Goal: Ambulate - Progress: Progressing toward goal Additional Goals Additional Goal #1: Patient to demonstrate mobility maintaining sternal precautions without cues at lest 70% of the time. PT Goal: Additional Goal #1 - Progress: Progressing toward goal  Visit Information  Last PT Received On: 10/03/12 Assistance Needed: +1    Subjective Data  Subjective: "i will walk."   Cognition  Cognition Arousal/Alertness: Awake/alert Behavior During Therapy: WFL for tasks assessed/performed Overall Cognitive Status: Within Functional Limits for tasks assessed    Balance  Static Standing Balance Static Standing - Balance Support: Bilateral upper extremity supported;During functional activity Static Standing - Level of Assistance: 5: Stand by assistance Static Standing - Comment/# of Minutes: 3 minutes with RW  End of Session PT - End of Session Equipment Utilized During Treatment: Gait belt Activity Tolerance: Patient tolerated treatment well Patient left: in chair;with call bell/phone within reach Nurse Communication: Mobility status        INGOLD,Jehu Mccauslin 10/03/2012, 4:14 PM [facility] Acute Rehabilitation [phone] (pager)

## 2012-10-03 NOTE — Progress Notes (Signed)
CARDIAC REHAB PHASE I   PRE:  Rate/Rhythm: 92SR  BP:  Supine:   Sitting: 125/90  Standing:    SaO2: 92%RA  MODE:  Ambulation: 350 ft   POST:  Rate/Rhythm: 103ST  BP:  Supine:   Sitting: 126/84  Standing:    SaO2: 94%RA 1315-1347 Pt walked 350 ft on RA with rolling walker and asst x 1. Gait steady. Tolerated well. Back to bed after walk. Sister in room. Has rolling walker at home if needed.   Luetta Nutting, RN BSN  10/03/2012 1:43 PM

## 2012-10-03 NOTE — Progress Notes (Signed)
Pt worried about not having bowel movement in several days. Gave pt prune juice this morning per pt request.

## 2012-10-04 ENCOUNTER — Inpatient Hospital Stay (HOSPITAL_COMMUNITY): Payer: Medicare Other

## 2012-10-04 DIAGNOSIS — Z952 Presence of prosthetic heart valve: Secondary | ICD-10-CM

## 2012-10-04 DIAGNOSIS — I319 Disease of pericardium, unspecified: Secondary | ICD-10-CM

## 2012-10-04 DIAGNOSIS — Z95828 Presence of other vascular implants and grafts: Secondary | ICD-10-CM

## 2012-10-04 LAB — CBC
HCT: 26.4 % — ABNORMAL LOW (ref 36.0–46.0)
Hemoglobin: 8.8 g/dL — ABNORMAL LOW (ref 12.0–15.0)
MCV: 88 fL (ref 78.0–100.0)
RBC: 3 MIL/uL — ABNORMAL LOW (ref 3.87–5.11)
WBC: 4.9 10*3/uL (ref 4.0–10.5)

## 2012-10-04 LAB — BASIC METABOLIC PANEL
BUN: 14 mg/dL (ref 6–23)
CO2: 26 mEq/L (ref 19–32)
Chloride: 98 mEq/L (ref 96–112)
Creatinine, Ser: 0.91 mg/dL (ref 0.50–1.10)

## 2012-10-04 MED ORDER — DSS 100 MG PO CAPS: 200.0000 mg | ORAL_CAPSULE | Freq: Two times a day (BID) | ORAL | Status: DC | PRN

## 2012-10-04 MED ORDER — OXYCODONE HCL 5 MG PO TABS: 5.0000 mg | ORAL_TABLET | Freq: Four times a day (QID) | ORAL | Status: DC | PRN

## 2012-10-04 MED ORDER — SODIUM CHLORIDE 0.9 % IV BOLUS (SEPSIS)
1000.0000 mL | Freq: Once | INTRAVENOUS | Status: AC
  Administered 2012-10-04: 1000 mL via INTRAVENOUS

## 2012-10-04 MED ORDER — ENOXAPARIN SODIUM 40 MG/0.4ML ~~LOC~~ SOLN
40.0000 mg | Freq: Every day | SUBCUTANEOUS | Status: DC
  Administered 2012-10-04: 40 mg via SUBCUTANEOUS
  Filled 2012-10-04 (×2): qty 0.4

## 2012-10-04 MED ORDER — ASPIRIN EC 81 MG PO TBEC
81.0000 mg | DELAYED_RELEASE_TABLET | Freq: Every day | ORAL | Status: DC
  Administered 2012-10-04 – 2012-10-05 (×2): 81 mg via ORAL
  Filled 2012-10-04 (×2): qty 1

## 2012-10-04 NOTE — Progress Notes (Signed)
Removed EPW and chest tube sutures per order. Removed right EPW with no problem, met resistance with left EPW. Called Erin Barrett, PA to come look at left EPW. Unable to pull out, so PA cut wires at skin. Pt tolerated well. No bleeding noted. Chest tube suture site oozing some. Placed steri strips and benzoin over site and gauze dressing. Pt on bed rest for 2 hours with frequent vitals set up.

## 2012-10-04 NOTE — Discharge Summary (Signed)
Physician Discharge Summary         [address]       [place] [id number]             [phone]      Patient ID: Diane Abbott MRN: [id number] DOB/AGE: April 15, 1935 78 y.o.  Admit date: 09/29/2012 Discharge date: 10/04/2012  Admission Diagnoses:  Patient Active Problem List   Diagnosis Date Noted  . Aortic aneurysm, thoracic 09/14/2012  . Aortic insufficiency 09/14/2012  . Hyperglycemia 07/27/2012  . Wound, open, forearm 03/21/2012  . Osteoarthritis of left shoulder 12/07/2011  . Preop exam for internal medicine 11/17/2011  . Bronchitis 09/17/2011  . Nausea 09/09/2011  . Breast lump in female 04/07/2011  . COPD (chronic obstructive pulmonary disease) 03/12/2011  . Esophagitis, unspecified 01/09/2011  . Insomnia 11/04/2010  . HICCUPS 07/01/2010  . RESTLESS LEG SYNDROME 02/03/2010  . THRUSH 07/04/2009  . PRURITUS 05/16/2009  . PULMONARY EMBOLISM 09/14/2008  . CONSTIPATION 09/14/2008  . RUQ PAIN 09/14/2008  . LUQ PAIN 09/14/2008  . ABDOMINAL PAIN-EPIGASTRIC 09/14/2008  . Edema 01/09/2008  . Pneumonia, organism unspecified 07/13/2007  . Chest pain, unspecified 07/13/2007  . DVT 07/08/2007  . GERD 07/08/2007  . Irritable bowel syndrome 07/08/2007  . CONNECTIVE TISSUE DISEASE 07/08/2007  . FIBROMYALGIA 07/08/2007  . CYSTITIS 05/31/2007  . DEPRESSION 05/11/2007  . SOMNOLENCE 05/11/2007  . FATIGUE 05/11/2007  . B12 DEFICIENCY 03/10/2007  . VITAMIN D DEFICIENCY 03/10/2007  . OSTEOARTHRITIS 03/10/2007  . OSTEOPENIA 03/10/2007  . VERTIGO 03/10/2007  . PULMONARY EMBOLISM, HX OF 03/10/2007  . Rheumatoid arthritis 11/23/2006   Discharge Diagnoses:   Patient Active Problem List   Diagnosis Date Noted  . S/P ascending aortic replacement 10/04/2012  . S/P AVR (aortic valve replacement) 10/04/2012  . Aortic aneurysm, thoracic 09/14/2012  . Aortic insufficiency 09/14/2012  . Hyperglycemia 07/27/2012  . Wound, open, forearm 03/21/2012  .  Osteoarthritis of left shoulder 12/07/2011  . Preop exam for internal medicine 11/17/2011  . Bronchitis 09/17/2011  . Nausea 09/09/2011  . Breast lump in female 04/07/2011  . COPD (chronic obstructive pulmonary disease) 03/12/2011  . Esophagitis, unspecified 01/09/2011  . Insomnia 11/04/2010  . HICCUPS 07/01/2010  . RESTLESS LEG SYNDROME 02/03/2010  . THRUSH 07/04/2009  . PRURITUS 05/16/2009  . PULMONARY EMBOLISM 09/14/2008  . CONSTIPATION 09/14/2008  . RUQ PAIN 09/14/2008  . LUQ PAIN 09/14/2008  . ABDOMINAL PAIN-EPIGASTRIC 09/14/2008  . Edema 01/09/2008  . Pneumonia, organism unspecified 07/13/2007  . Chest pain, unspecified 07/13/2007  . DVT 07/08/2007  . GERD 07/08/2007  . Irritable bowel syndrome 07/08/2007  . CONNECTIVE TISSUE DISEASE 07/08/2007  . FIBROMYALGIA 07/08/2007  . CYSTITIS 05/31/2007  . DEPRESSION 05/11/2007  . SOMNOLENCE 05/11/2007  . FATIGUE 05/11/2007  . B12 DEFICIENCY 03/10/2007  . VITAMIN D DEFICIENCY 03/10/2007  . OSTEOARTHRITIS 03/10/2007  . OSTEOPENIA 03/10/2007  . VERTIGO 03/10/2007  . PULMONARY EMBOLISM, HX OF 03/10/2007  . Rheumatoid arthritis 11/23/2006   Discharged Condition: good  History of Present Illness:   [name] is a 78 yo white female with known history Fibromyalgia, Rheumatoid/Degenerative Arthritis, and possible familial hypercoagulable state.  She was undergoing evaluation by Dr. Jeral Abbott for cervical spine disease.  She underwent MRI which showed a 5 cm Ascending Aortic Aneurysm.  Further evaluation with CTA of the chest showed the maximum diameter of the Ascending Aorta to be 5.8 cm extending from the root to the aortic arch.  This film was compared to a CT scan performed in  2009 which showed an increase from 4.5 cm.  Patient underwent further workup with cardiac catheterization which did not show evidence of Coronary Artery Disease.  Echocardiogram was also obtained and revealed moderate Aortic Stenosis.   Due to this finding she was  referred to [facility] and evaluated by Dr. Laneta Abbott.  She was evaluated on 09/21/2012 at which time it was felt she would benefit from [facility] procedure.  The risks and benefits of the procedure were explained to the patient and she was agreeable to proceed.   Hospital Course:   [name] presented to [facility].  She was taken to the operating room and underwent Bentall procedure with use of a 26 mm Gelweave Valsalva graft and a 21 mm Edwards pericardial magna ease valve.  She also underwent Ascending Aorta and Proximal Aortic Arch using a 30 mm Hemashield graft under deep hypothermic circulatory arrest.  She tolerated the procedure well.  The patient was extubated in the early morning after surgery.  During her stay in the ICU she was weaned off Dopamine and Neo Synephrine as blood pressure tolerated.  Her chest tubes and arterial lines were removed without difficulty.  The patient received PRBCs for expected blood loss anemia.  The patient was ambulating in the ICU and was medically stable.  She was transferred to the step down unit.  Physical therapy evaluated the patient and felt she was safe to be discharged home with PT.  She is maintaining NSR.  One pacing wires was removed successfully.  However her nurse was unable to remove the other one.  I attempted to remove the final pacing wire and was unable to do so.  I met significant resistance and this wire was cut at her skin.  She continues to do well.  She is ambulating without difficulty.  She is tolerating a regular diet.  We will discharge the patient today 10/04/2012 should no further issues arise.   Significant Diagnostic Studies:   CT Scan: Aneurysmal dilatation of the ascending aorta has increased from 4.5  cm to 5.8 cm as described  Cardiac Catheterization:   Hemodynamics (mmHg)  RA mean 3  RV 26/4  PA 21/15  PCWP mean 9  LV 130/12  AO 124/80  Oxygen saturations:  PA 69%  AO 90%  Cardiac Output (Fick) 6.1  Cardiac Index (Fick)  3.4  Coronary angiography:  Coronary dominance: right  Left mainstem: No significant disease.  Left anterior descending (LAD): Mild luminal irregularities.  Left circumflex (LCx): No significant disease. There is a small to moderate ramus and a large PLOM.  Right coronary artery (RCA): No significant disease.  Aortic root angiography: 3+ aortic insufficiency. Aneurysmal dilatation from the sinotubular junction to the proximal aortic arch.  Final Conclusions: No significant CAD. 3+ aortic insufficiency.   Echocardiogram:   Study Conclusions  - Left ventricle: The cavity size was normal. Wall thickness was normal. Systolic function was normal. The estimated ejection fraction was in the range of 60% to 65%. - Aortic valve: Moderate regurgitation.There was no stenosis. - Aorta: The aorta was moderately dilated.  Treatments: surgery:   Median sternotomy, extracorporeal circulation,  replacement of the ascending aorta and proximal aortic arch using a 30-  mm Hemashield tube graft under deep hypothermic circulatory arrest, and  Bentall procedure using a composite 26 mm Gelweave Valsalva graft and a  21 mm Edwards pericardial Magna-Ease valve.  Disposition: 01-Home or Self Care   Future Appointments Provider Department Dept Phone   11/21/2012 1:45  PM Laurey Morale, MD [facility] Main Office [place]) [phone]   11/25/2012 7:45 AM Tresa Garter, MD [facility] [phone]   01/24/2013 7:45 AM Tresa Garter, MD [facility] [phone]   03/23/2013 7:45 AM Tresa Garter, MD [facility] -[place] [phone]     Discharge medication:     Medication List    TAKE these medications       DSS 100 MG Caps  Take 200 mg by mouth 2 (two) times daily as needed for constipation.     esomeprazole 40 MG capsule  Commonly known as:  NEXIUM  Take 40 mg by mouth daily as needed. Acid reflux      fluconazole 150 MG tablet  Commonly known as:  DIFLUCAN  Take 150 mg by mouth once. PRN ONLY with ABX     oxyCODONE 10 MG 12 hr tablet  Commonly known as:  OXYCONTIN  Take 1 tablet (10 mg total) by mouth every 12 (twelve) hours as needed for pain. Please fill on 11/16/12     oxyCODONE 5 MG immediate release tablet  Commonly known as:  Oxy IR/ROXICODONE  Take 1-2 tablets (5-10 mg total) by mouth every 6 (six) hours as needed.     rOPINIRole 2 MG tablet  Commonly known as:  REQUIP  Take 2 mg by mouth 3 (three) times daily.            Follow-up Information   Follow up with [name], MD In 3 weeks. (Office will mail your appointment)    Contact information:   [address] [place] [address] [phone]       Follow up with [facility] In 3 weeks. (Please get CXR 1 hour prior to appointment with Dr. Laneta Abbott)    Contact information:   [place]       Schedule an appointment as soon as possible for a visit with Marca Ancona, MD. (Please contact office to set up 2-4 week follow up appointment)    Contact information:   [address] [place] [address] [phone]       Signed: Lowella Dandy 10/04/2012, 8:18 AM

## 2012-10-04 NOTE — Progress Notes (Signed)
[id number] Cardiac Rehab Completed discharge education with pt. She voices understanding. Pt agrees to Outpt. CRP in [place], will send referral. Melina Copa RN

## 2012-10-04 NOTE — Progress Notes (Signed)
Patient ID: Diane Abbott, female   DOB: 10-21-1934, 78 y.o.   MRN: [id number]   2-D echo reviewed at bedside. There is a small posterior pericardial effusion that is expected postop but no other significant effusion or sign of bleeding. LV and RV function look good. I don't see anything on echo to account for dyspnea or hypotension. She seems hemodynamically stable at this time with SBP 110 and pulse 90 sinus. Will continue to observe. She has hx of prior DVT and PE in past but has been on lovenox and SCD's postop so I think this is unlikely.

## 2012-10-04 NOTE — Progress Notes (Signed)
Pt ambulated for the second time today with assistance approx. 300 feet. Pt tolerated better than this mornings walk. Pt SOB when back to bed, however, was more stable during walk. Pt states she feels better than this morning. Will continue to monitor.

## 2012-10-04 NOTE — Progress Notes (Signed)
  Echocardiogram 2D Echocardiogram has been performed.  Georgian Co 10/04/2012, 12:41 PM

## 2012-10-04 NOTE — Progress Notes (Signed)
5 Days Post-Op Procedure(s) (LRB): BENTALL PROCEDURE (N/A) INTRAOPERATIVE TRANSESOPHAGEAL ECHOCARDIOGRAM (N/A) Subjective:  Diane Abbott states she feels good this morning.  She had a BM yesterday afternoon.  In regards to discharge she states she has decided to go home and feels ready today.  Objective: Vital signs in last 24 hours: Temp:  [98.2 F (36.8 C)-98.9 F (37.2 C)] 98.9 F (37.2 C) (06/03 0528) Pulse Rate:  [89-91] 90 (06/03 0528) Cardiac Rhythm:  [-] Normal sinus rhythm (06/02 2010) Resp:  [16-18] 18 (06/03 0528) BP: (100-112)/(58-81) 112/81 mmHg (06/03 0528) SpO2:  [91 %-94 %] 94 % (06/03 0528) Weight:  [160 lb 1.6 oz (72.621 kg)] 160 lb 1.6 oz (72.621 kg) (06/03 0528)  Intake/Output from previous day: 06/02 0701 - 06/03 0700 In: 120 [P.O.:120] Out: 1 [Stool:1]  General appearance: alert, cooperative and no distress Neurologic: intact Heart: regular rate and rhythm Lungs: clear to auscultation bilaterally Abdomen: soft, non-tender; bowel sounds normal; no masses,  no organomegaly Extremities: edema trace Wound: clean and dry  Lab Results:  Recent Labs  10/02/12 0350 10/03/12 0400  WBC 6.0 4.6  HGB 8.3* 8.9*  HCT 25.1* 26.9*  PLT 74* 81*   BMET:  Recent Labs  10/02/12 0350 10/03/12 0400  NA 136 137  K 4.2 3.6  CL 104 101  CO2 29 30  GLUCOSE 98 105*  BUN 21 17  CREATININE 1.03 0.89  CALCIUM 8.2* 8.7    PT/INR: No results found for this basename: LABPROT, INR,  in the last 72 hours ABG    Component Value Date/Time   PHART 7.612* 09/30/2012 0216   HCO3 23.5 09/30/2012 0216   TCO2 26 09/30/2012 1748   ACIDBASEDEF 1.0 09/29/2012 2304   O2SAT 100.0 09/30/2012 0216   CBG (last 3)   Recent Labs  10/03/12 0552 10/03/12 2120 10/04/12 0623  GLUCAP 93 108* 140*    Assessment/Plan: S/P Procedure(s) (LRB): BENTALL PROCEDURE (N/A) INTRAOPERATIVE TRANSESOPHAGEAL ECHOCARDIOGRAM (N/A)  1. CV- NSR rate tachy, good pressure control currently not on  beta blocker 2. Pulm- no acute issues 3. Deconditioning-mild will arrange home PT 4. Dispo- patient stable, will arrange home PT, plan for d/c today  LOS: 5 days    Diane Abbott, Diane Abbott 10/04/2012

## 2012-10-04 NOTE — Progress Notes (Signed)
Pt's systolic BP in low 80's-90's. Pt feels weak/tired and slightly SOB, however O2 sats 95% on RA. Notified PA of this. Ordered to walk and see how pt feels, monitor her for an hour then re-assess her discharge. Pt ambulated with rolling walker. Pt's gait was steady, pt tired and SOB when back to bed. Pt's BP 101/76 when got back in bed. Will continue to monitor.

## 2012-10-04 NOTE — Progress Notes (Signed)
5 Days Post-Op Procedure(s) (LRB): BENTALL PROCEDURE (N/A) INTRAOPERATIVE TRANSESOPHAGEAL ECHOCARDIOGRAM (N/A) Subjective:  Patient says she was feeling fine this am but has been short of breath since pacing wires removed this am. Apparently there was some difficulty removing the ventricular wires and PA had to cut off one of the wires.  Objective: Vital signs in last 24 hours: Temp:  [98.2 F (36.8 C)-98.9 F (37.2 C)] 98.9 F (37.2 C) (06/03 0528) Pulse Rate:  [89-99] 99 (06/03 1031) Cardiac Rhythm:  [-] Normal sinus rhythm (06/03 0805) Resp:  [16-18] 18 (06/03 0528) BP: (83-112)/(45-81) 94/67 mmHg (06/03 1031) SpO2:  [91 %-94 %] 94 % (06/03 0528) Weight:  [72.621 kg (160 lb 1.6 oz)] 72.621 kg (160 lb 1.6 oz) (06/03 0528)  Hemodynamic parameters for last 24 hours:    Intake/Output from previous day: 06/02 0701 - 06/03 0700 In: 120 [P.O.:120] Out: 1 [Stool:1] Intake/Output this shift:    General appearance: alert and cooperative Neurologic: intact Heart: regular rate and rhythm, S1, S2 normal, no murmur, click, rub or gallop Lungs: crackles in both bases Wound: incision ok  Lab Results:  Recent Labs  10/02/12 0350 10/03/12 0400  WBC 6.0 4.6  HGB 8.3* 8.9*  HCT 25.1* 26.9*  PLT 74* 81*   BMET:  Recent Labs  10/02/12 0350 10/03/12 0400  NA 136 137  K 4.2 3.6  CL 104 101  CO2 29 30  GLUCOSE 98 105*  BUN 21 17  CREATININE 1.03 0.89  CALCIUM 8.2* 8.7    PT/INR: No results found for this basename: LABPROT, INR,  in the last 72 hours ABG    Component Value Date/Time   PHART 7.612* 09/30/2012 0216   HCO3 23.5 09/30/2012 0216   TCO2 26 09/30/2012 1748   ACIDBASEDEF 1.0 09/29/2012 2304   O2SAT 100.0 09/30/2012 0216   CBG (last 3)   Recent Labs  10/03/12 0552 10/03/12 2120 10/04/12 0623  GLUCAP 93 108* 140*    Assessment/Plan: S/P Procedure(s) (LRB): BENTALL PROCEDURE (N/A) INTRAOPERATIVE TRANSESOPHAGEAL ECHOCARDIOGRAM (N/A)  She runs a low  normal BP around 100-110 but has dropped a little lower after pacing wire removal with one documented BP in the 80's. She is now 107/54 with HR 90's. It may be that her dyspnea is due to atelectasis on exam but will get an echo stat to make sure there is no significnant pericardial effusion. She will not be discharged today.   LOS: 5 days    BARTLE,BRYAN K 10/04/2012

## 2012-10-04 NOTE — Care Management Note (Signed)
    Page 1 of 2   10/05/2012     1:30:20 PM   CARE MANAGEMENT NOTE 10/05/2012  Patient:  Diane Abbott, Diane Abbott   Account Number:  [id number]  Date Initiated:  10/02/2012  Documentation initiated by:  Jamielynn Wigley  Subjective/Objective Assessment:   PT S/P AORTIC ARCH REPLACEMENT AND BENTALL PROCEDURE ON 09/29/12.  PTA, PT INDEPENDENT, LIVES WITH DAUGHTER.     Action/Plan:   DAUGHTER TO PROVIDE 24HR CARE AT DC.  SISTER WILL BE ABLE TO ASSIST UNTIL 6/7.  WILL NEED HH FOLLOW UP.   Anticipated DC Date:  10/05/2012   Anticipated DC Plan:  HOME W HOME HEALTH SERVICES      DC Planning Services  CM consult      [facility] Choice  HOME HEALTH   Choice offered to / List presented to:  C-1 Patient        HH arranged  HH-1 RN  HH-2 PT      [facility] agency  [facility]   Status of service:  Completed, signed off Medicare Important Message given?   (If response is "NO", the following Medicare IM given date fields will be blank) Date Medicare IM given:   Date Additional Medicare IM given:    Discharge Disposition:  HOME W HOME HEALTH SERVICES  Per UR Regulation:  Reviewed for med. necessity/level of care/duration of stay  If discussed at Long Length of Stay Meetings, dates discussed:    Comments:  10/05/12 Rosalita Chessman [phone] PT FOR DC HOME TODAY.  NOTIFIED [facility] OF DC DATE.  10/03/12 Kylena Mole,RN,BSN [phone] REFERRAL TO [facility], PER PT CHOICE.  START OF CARE 24-48H POST DC DATE.  NO DME NEEDS, PER PT.

## 2012-10-05 LAB — CBC
Hemoglobin: 8.8 g/dL — ABNORMAL LOW (ref 12.0–15.0)
MCHC: 34.1 g/dL (ref 30.0–36.0)
WBC: 4 10*3/uL (ref 4.0–10.5)

## 2012-10-05 LAB — BASIC METABOLIC PANEL
CO2: 28 mEq/L (ref 19–32)
Creatinine, Ser: 0.85 mg/dL (ref 0.50–1.10)
GFR calc Af Amer: 75 mL/min — ABNORMAL LOW (ref >90)
GFR calc non Af Amer: 65 mL/min — ABNORMAL LOW (ref >90)
Glucose, Bld: 117 mg/dL — ABNORMAL HIGH (ref 70–99)

## 2012-10-05 MED ORDER — ASPIRIN 81 MG PO TBEC: 81.0000 mg | DELAYED_RELEASE_TABLET | Freq: Every day | ORAL | Status: DC

## 2012-10-05 MED ORDER — POTASSIUM CHLORIDE CRYS ER 20 MEQ PO TBCR
40.0000 meq | EXTENDED_RELEASE_TABLET | Freq: Once | ORAL | Status: AC
  Administered 2012-10-05: 40 meq via ORAL
  Filled 2012-10-05: qty 2

## 2012-10-05 NOTE — Progress Notes (Signed)
CARDIAC REHAB PHASE I   PRE:  Rate/Rhythm: 94SR  BP:  Supine: 104/64  Sitting:   Standing:    SaO2: 94%RA  MODE:  Ambulation: 550 ft   POST:  Rate/Rhythm: 116ST  BP:  Supine:   Sitting: 124/70  Standing:    SaO2: 96%RA [id number] Pt feeling better today. Walked 550 ft on RA with rolling walker and minimal asst. Gait steady. Stopped once to rest. To bathroom and then to chair after walk. Encouraged walks at home. Has walker for home use. Tolerated well.   Luetta Nutting, RN BSN  10/05/2012 8:39 AM

## 2012-10-05 NOTE — Progress Notes (Signed)
10/05/2012 11:40 AM Nursing note Discharge avs form, rx, medications already taken today and those due this evening given and explained to patient and sister. Questions and concerns addressed. Follow up appoinments, activity restrictions, incision site care and when to call MD reviewed. Pt. States she had already reviewed video #113. Pt. Given Moving right along book given and explained to patient. D/c iv line. D/c tele. D/c home with sister per orders. Pt. States she has a walker at home as well as contact information for [facility].  Shakendra Griffeth, Blanchard Kelch

## 2012-10-05 NOTE — Progress Notes (Addendum)
6 Days Post-Op Procedure(s) (LRB): BENTALL PROCEDURE (N/A) INTRAOPERATIVE TRANSESOPHAGEAL ECHOCARDIOGRAM (N/A) Subjective:  Diane Abbott states she is feeling much better.  Yesterday after sternal wires were removed patient became hypotensive and short of breath.  Echo ruled out significant pericardial effusion, CXR shows minimal left pleural effusion.  Patient states she wants to go home today.   Objective: Vital signs in last 24 hours: Temp:  [98.7 F (37.1 C)-99.7 F (37.6 C)] 99 F (37.2 C) (06/04 0511) Pulse Rate:  [89-99] 97 (06/04 0511) Cardiac Rhythm:  [-] Normal sinus rhythm (06/03 2300) Resp:  [18] 18 (06/04 0511) BP: (83-132)/(35-77) 132/77 mmHg (06/04 0511) SpO2:  [92 %-97 %] 97 % (06/04 0511) FiO2 (%):  [2 %] 2 % (06/03 1345) Weight:  [160 lb (72.576 kg)] 160 lb (72.576 kg) (06/04 0500)  Intake/Output from previous day: 06/03 0701 - 06/04 0700 In: 1080 [P.O.:1080] Out: 800 [Urine:800]  General appearance: alert, cooperative and no distress Neurologic: intact Heart: regular rate and rhythm Lungs: clear to auscultation bilaterally Abdomen: soft, non-tender; bowel sounds normal; no masses,  no organomegaly Extremities: edema none appreciated Wound: clean and dry  Lab Results:  Recent Labs  10/04/12 1409 10/05/12 0438  WBC 4.9 4.0  HGB 8.8* 8.8*  HCT 26.4* 25.8*  PLT 111* 115*   BMET:  Recent Labs  10/04/12 1409 10/05/12 0438  NA 133* 138  K 3.7 3.4*  CL 98 104  CO2 26 28  GLUCOSE 138* 117*  BUN 14 12  CREATININE 0.91 0.85  CALCIUM 8.5 8.5    PT/INR: No results found for this basename: LABPROT, INR,  in the last 72 hours ABG    Component Value Date/Time   PHART 7.612* 09/30/2012 0216   HCO3 23.5 09/30/2012 0216   TCO2 26 09/30/2012 1748   ACIDBASEDEF 1.0 09/29/2012 2304   O2SAT 100.0 09/30/2012 0216   CBG (last 3)   Recent Labs  10/03/12 0552 10/03/12 2120 10/04/12 0623  GLUCAP 93 108* 140*    Assessment/Plan: S/P Procedure(s)  (LRB): BENTALL PROCEDURE (N/A) INTRAOPERATIVE TRANSESOPHAGEAL ECHOCARDIOGRAM (N/A)  1. CV- NSR, remains tachy, hypotension improved, BP is 130s this morning, I am hesitant to start patient on a Beta blocker due to symptomatic hypotension, will discuss with staff 2. Pulm- dyspnea resolved, continued small left pleural effusion on CXR, not on oxygen, continued use of IS 3. Renal- creatinine WNL, volume status is stable, mild hypokalemia, will give potassium this morning 4. Deconditioning- Home PT arranged 5. Dispo- patient is doing better this morning, wants to go home- will discuss with staff   LOS: 6 days    Lowella Dandy 10/05/2012   Chart reviewed, patient examined, agree with above. She feels much better after some fluid yesterday afternoon. I think she was dehydrated. Her SBP is 130, Hgb is stable. Urine output is good and creat normal. I think she can go home today if she walks without significant dyspnea.

## 2012-10-05 NOTE — Progress Notes (Signed)
[facility] [phone] (pager) PT Cancellation Note  Patient Details Name: Diane Abbott MRN: [id number] DOB: November 14, 1934   Cancelled Treatment:    Reason Eval/Treat Not Completed: Other (comment) (refused secondary to d/c home)   INGOLD,Lecil Tapp 10/05/2012, 10:56 AM

## 2012-10-06 ENCOUNTER — Telehealth: Payer: Self-pay | Admitting: Family Medicine

## 2012-10-06 ENCOUNTER — Other Ambulatory Visit: Payer: Self-pay | Admitting: Internal Medicine

## 2012-10-06 NOTE — Telephone Encounter (Signed)
Transitional Care Call: Spoke with patient.  Discharged on 10/05/12.  S/P ascending aortic replacement and AVR (aortic valve replacement).  Pt states she is doing "as well as possible".  Her daughter and her sister are helping her at home.  She states home that health called her today to set up appointments.  She is mobilizing well will rolling walker around her home and is able to play the piano.  She is taking oxycodone as directed for pain and this seems to be helping.  Pt understands discharge instructions and med list.  Requesting refill on [facility], will send to pharmacy.  Denies any questions for Dr. Posey Rea at this time.  Follow up appt scheduled for 6/9 at 10:30am.

## 2012-10-07 DIAGNOSIS — M069 Rheumatoid arthritis, unspecified: Secondary | ICD-10-CM | POA: Diagnosis not present

## 2012-10-07 DIAGNOSIS — M159 Polyosteoarthritis, unspecified: Secondary | ICD-10-CM | POA: Diagnosis not present

## 2012-10-07 DIAGNOSIS — Z48812 Encounter for surgical aftercare following surgery on the circulatory system: Secondary | ICD-10-CM | POA: Diagnosis not present

## 2012-10-07 DIAGNOSIS — F329 Major depressive disorder, single episode, unspecified: Secondary | ICD-10-CM | POA: Diagnosis not present

## 2012-10-07 DIAGNOSIS — F3289 Other specified depressive episodes: Secondary | ICD-10-CM | POA: Diagnosis not present

## 2012-10-07 DIAGNOSIS — IMO0001 Reserved for inherently not codable concepts without codable children: Secondary | ICD-10-CM | POA: Diagnosis not present

## 2012-10-07 NOTE — Telephone Encounter (Signed)
Noted. Thx.

## 2012-10-10 ENCOUNTER — Ambulatory Visit (INDEPENDENT_AMBULATORY_CARE_PROVIDER_SITE_OTHER)
Admission: RE | Admit: 2012-10-10 | Discharge: 2012-10-10 | Disposition: A | Discharge status: Home / Self Care | Payer: Medicare Other | Source: Ambulatory Visit | Attending: Internal Medicine | Admitting: Internal Medicine

## 2012-10-10 ENCOUNTER — Ambulatory Visit (INDEPENDENT_AMBULATORY_CARE_PROVIDER_SITE_OTHER): Payer: Medicare Other | Admitting: Internal Medicine

## 2012-10-10 ENCOUNTER — Other Ambulatory Visit: Payer: Self-pay | Admitting: Internal Medicine

## 2012-10-10 ENCOUNTER — Inpatient Hospital Stay: Payer: Medicare Other | Admitting: Internal Medicine

## 2012-10-10 ENCOUNTER — Encounter: Payer: Self-pay | Admitting: Internal Medicine

## 2012-10-10 VITALS — BP 110/62 | HR 76 | Temp 98.4°F | Resp 16 | Wt 158.0 lb

## 2012-10-10 DIAGNOSIS — R05 Cough: Secondary | ICD-10-CM | POA: Insufficient documentation

## 2012-10-10 DIAGNOSIS — J90 Pleural effusion, not elsewhere classified: Secondary | ICD-10-CM | POA: Diagnosis not present

## 2012-10-10 DIAGNOSIS — F329 Major depressive disorder, single episode, unspecified: Secondary | ICD-10-CM

## 2012-10-10 DIAGNOSIS — R059 Cough, unspecified: Secondary | ICD-10-CM | POA: Diagnosis not present

## 2012-10-10 DIAGNOSIS — Z952 Presence of prosthetic heart valve: Secondary | ICD-10-CM

## 2012-10-10 DIAGNOSIS — Z95828 Presence of other vascular implants and grafts: Secondary | ICD-10-CM

## 2012-10-10 MED ORDER — TRIAZOLAM 0.125 MG PO TABS: 0.1250 mg | ORAL_TABLET | Freq: Every evening | ORAL | Status: DC | PRN

## 2012-10-10 MED ORDER — BENZONATATE 100 MG PO CAPS: 100.0000 mg | ORAL_CAPSULE | Freq: Two times a day (BID) | ORAL | Status: DC | PRN

## 2012-10-10 MED ORDER — DOXYCYCLINE HYCLATE 100 MG PO TABS: 100.0000 mg | ORAL_TABLET | Freq: Two times a day (BID) | ORAL | Status: DC

## 2012-10-10 MED ORDER — FLUCONAZOLE 150 MG PO TABS: 150.0000 mg | ORAL_TABLET | Freq: Once | ORAL | Status: DC

## 2012-10-10 MED ORDER — LIDOCAINE 5 % EX PTCH: 3.0000 | MEDICATED_PATCH | CUTANEOUS | Status: DC

## 2012-10-10 NOTE — Assessment & Plan Note (Signed)
S/P Bentall Procedure 5/29

## 2012-10-10 NOTE — Progress Notes (Signed)
Subjective:   Post-hosp f/u visit   HPI  C/o cough w/green sputum production x 1 wk  F/u R CTS  and neck pain - Dr Jeral Fruit is planning to do surgeries: she had a myelogram   S/p thor AA and AR recently repaired. AVR is repaired Dr Laneta Simmers - she went home last Wed  S/p L shoulder replacement 12/07/11 by Dr Dion Saucier. F/u pain in L arm - better C/o cough  The patient is here to follow up on chronic depression, anxiety, headaches and chronic moderate fibromyalgia symptoms controlled with medicines, stable. Xarelto - we switched her to it due to Lovenox cost a few months ago. Xarelto is $400/mo.Marland KitchenMarland KitchenShe is on Sympony for RA q 2 mo  Wt Readings from Last 3 Encounters:  10/10/12 158 lb (71.668 kg)  10/05/12 160 lb (72.576 kg)  10/05/12 160 lb (72.576 kg)   BP Readings from Last 3 Encounters:  10/10/12 110/62  10/05/12 115/63  10/05/12 115/63   Past Medical History  Diagnosis Date  . Fibromyalgia   . Pulmonary embolism   . RA (rheumatoid arthritis)   . Vitamin B 12 deficiency   . Vitamin D deficiency   . Osteoarthritis   . Osteopenia   . Adrenal insufficiency   . IBS (irritable bowel syndrome)   . History of blood clots 2008    below knee  . Diabetes mellitus type II     pt denies this 03-05-11  . Esophagitis   . Depression   . GERD (gastroesophageal reflux disease)   . Connective tissue disorder   . Cataracts, bilateral     more in left than right  . Osteoarthritis of left shoulder 12/07/2011  . Heart murmur   . Leaky heart valve   . Anginal pain     "comes and goes has occured since 2012"  . Bronchitis Jan 2013-March 2013    severe  . Shortness of breath     comes from aneurysm  . Vertigo     hx of  . Urinary leakage     when coughing   Past Surgical History  Procedure Laterality Date  . Abdominal hysterectomy    . Total knee arthroplasty      bilateral  . Cholecystectomy  2003  . Tonsillectomy  1941  . Appendectomy  1995  . Wrist surgery      bilateral  .  Bilateral oophorectomy    . Tummy tuck    . Cosmetic surgery      Tummy tuck  . Total shoulder arthroplasty  12/07/2011    Procedure: TOTAL SHOULDER ARTHROPLASTY;  Surgeon: Eulas Post, MD;  Location: [facility] OR;  Service: Orthopedics;  Laterality: Left;  left total shoulder artthroplasty  . Joint replacement Bilateral   . Cardiac catheterization  09/15/12    no PCI  . Colonoscopy    . Breast biopsy Left   . Skin cancer excision Right     cheek  . Eye surgery Bilateral     Cataract  . Bentall procedure N/A 09/29/2012    Procedure: BENTALL PROCEDURE;  Surgeon: [name], MD;  Location: [facility] OR;  Service: Open Heart Surgery;  Laterality: N/A;  WITH CIRC ARREST  . Intraoperative transesophageal echocardiogram N/A 09/29/2012    Procedure: INTRAOPERATIVE TRANSESOPHAGEAL ECHOCARDIOGRAM;  Surgeon: [name], MD;  Location: [facility] OR;  Service: Open Heart Surgery;  Laterality: N/A;    reports that she has never smoked. She has never used smokeless tobacco. She reports that  drinks alcohol. She reports that she does not use illicit drugs. family history includes Breast cancer in an unspecified family member; COPD in her father; Clotting disorder in an unspecified family member; Diabetes in her mother; Heart disease in her maternal grandmother; Ovarian cancer in her mother; and Pancreatic cancer in an unspecified family member.  There is no history of Colon cancer. Allergies  Allergen Reactions  . Codeine Other (See Comments)    Blood pressure drops; "I pass out."  . Pramipexole Dihydrochloride Nausea Only    sick, falling  . Rofecoxib Other (See Comments)    "sleep walks"  . Arava (Leflunomide) Swelling  . Clonazepam   . Diclofenac Sodium   . Venlafaxine   . Alprazolam Other (See Comments)    "Makes me mean"  . Benzodiazepines Other (See Comments)    "Halllucinations?"  . Darvocet (Propoxyphene-Acetaminophen) Other (See Comments)    "makes my eyes dilate. Pt stated I can't see"  .  Duloxetine Other (See Comments)    achy legs  . Gabapentin Other (See Comments)    headache  . Latex Itching and Dermatitis    When examined with latex gloves, burns and itches in contact areas per pt.  . Morphine And Related Other (See Comments)    Hallucinations   . Nortriptyline Hcl Other (See Comments)    Migraines   . Penicillins Rash    Can take Cephalosporins  . Prednisone Swelling    Just doesn't want to take it.   Current Outpatient Prescriptions on File Prior to Visit  Medication Sig Dispense Refill  . aspirin EC 81 MG EC tablet Take 1 tablet (81 mg total) by mouth daily.      Marland Kitchen docusate sodium 100 MG CAPS Take 200 mg by mouth 2 (two) times daily as needed for constipation.  10 capsule  0  . esomeprazole (NEXIUM) 40 MG capsule Take 40 mg by mouth daily as needed. Acid reflux      . fluconazole (DIFLUCAN) 150 MG tablet Take 150 mg by mouth once. PRN ONLY with ABX      . oxyCODONE (OXY IR/ROXICODONE) 5 MG immediate release tablet Take 1-2 tablets (5-10 mg total) by mouth every 6 (six) hours as needed.  30 tablet  0  . oxyCODONE (OXYCONTIN) 10 MG 12 hr tablet Take 1 tablet (10 mg total) by mouth every 12 (twelve) hours as needed for pain. Please fill on 11/16/12  60 tablet  0  . rOPINIRole (REQUIP) 2 MG tablet Take 2 mg by mouth 3 (three) times daily.      Marland Kitchen rOPINIRole (REQUIP) 2 MG tablet TAKE ONE TABLET BY MOUTH THREE TIMES DAILY  90 tablet  0   No current facility-administered medications on file prior to visit.      Review of Systems  Constitutional: Negative for activity change, appetite change, fatigue and unexpected weight change.  HENT: Positive for sinus pressure. Negative for congestion and mouth sores.   Eyes: Negative for visual disturbance.  Respiratory: Negative for chest tightness.   Gastrointestinal: Negative for nausea and abdominal pain.  Genitourinary: Negative for frequency, difficulty urinating and vaginal pain.  Musculoskeletal: Positive for back  pain and arthralgias. Negative for gait problem.  Skin: Negative for pallor.  Neurological: Negative for dizziness, tremors, weakness and numbness.  Psychiatric/Behavioral: Negative for confusion and sleep disturbance.   BP 110/62  Pulse 76  Temp(Src) 98.4 F (36.9 C) (Oral)  Resp 16  Wt 158 lb (71.668 kg)  BMI 28.89 kg/m2  Objective:   Physical Exam  Constitutional: She appears well-developed and well-nourished. No distress.  HENT:  Head: Normocephalic.  Right Ear: External ear normal.  Left Ear: External ear normal.  Nose: Nose normal.  Mouth/Throat: Oropharynx is clear and moist.  Eyes: Conjunctivae are normal. Pupils are equal, round, and reactive to light. Right eye exhibits no discharge. Left eye exhibits no discharge.  Neck: Normal range of motion. Neck supple. No JVD present. No tracheal deviation present. No thyromegaly present.  Cardiovascular: Normal rate, regular rhythm and normal heart sounds.   Pulmonary/Chest: No stridor. No respiratory distress. She has no wheezes.  Abdominal: Soft. Bowel sounds are normal. She exhibits no distension and no mass. There is no tenderness. There is no rebound and no guarding.  Musculoskeletal: She exhibits tenderness. She exhibits no edema.  L shoulder is not tender w/ROM Neck is tender w/ROM  Lymphadenopathy:    She has no cervical adenopathy.  Neurological: She displays normal reflexes. No cranial nerve deficit. She exhibits normal muscle tone. Coordination normal.  Skin: No rash noted. No erythema.  Psychiatric: She has a normal mood and affect. Her behavior is normal. Judgment and thought content normal.  sad  coughing  Lab Results  Component Value Date   WBC 4.0 10/05/2012   HGB 8.8* 10/05/2012   HCT 25.8* 10/05/2012   PLT 115* 10/05/2012   GLUCOSE 117* 10/05/2012   ALT 12 09/27/2012   AST 23 09/27/2012   NA 138 10/05/2012   K 3.4* 10/05/2012   CL 104 10/05/2012   CREATININE 0.85 10/05/2012   BUN 12 10/05/2012   CO2 28 10/05/2012    TSH 2.88 01/09/2011   INR 1.34 09/29/2012   HGBA1C 5.7* 09/27/2012           Assessment & Plan:

## 2012-10-10 NOTE — Assessment & Plan Note (Signed)
Chart reviewed.

## 2012-10-10 NOTE — Assessment & Plan Note (Signed)
Continue with current prescription therapy as reflected on the Med list.  

## 2012-10-11 DIAGNOSIS — F3289 Other specified depressive episodes: Secondary | ICD-10-CM | POA: Diagnosis not present

## 2012-10-11 DIAGNOSIS — IMO0001 Reserved for inherently not codable concepts without codable children: Secondary | ICD-10-CM | POA: Diagnosis not present

## 2012-10-11 DIAGNOSIS — M159 Polyosteoarthritis, unspecified: Secondary | ICD-10-CM | POA: Diagnosis not present

## 2012-10-11 DIAGNOSIS — F329 Major depressive disorder, single episode, unspecified: Secondary | ICD-10-CM | POA: Diagnosis not present

## 2012-10-11 DIAGNOSIS — Z48812 Encounter for surgical aftercare following surgery on the circulatory system: Secondary | ICD-10-CM | POA: Diagnosis not present

## 2012-10-11 DIAGNOSIS — M069 Rheumatoid arthritis, unspecified: Secondary | ICD-10-CM | POA: Diagnosis not present

## 2012-10-12 DIAGNOSIS — F3289 Other specified depressive episodes: Secondary | ICD-10-CM | POA: Diagnosis not present

## 2012-10-12 DIAGNOSIS — M069 Rheumatoid arthritis, unspecified: Secondary | ICD-10-CM | POA: Diagnosis not present

## 2012-10-12 DIAGNOSIS — F329 Major depressive disorder, single episode, unspecified: Secondary | ICD-10-CM | POA: Diagnosis not present

## 2012-10-12 DIAGNOSIS — Z48812 Encounter for surgical aftercare following surgery on the circulatory system: Secondary | ICD-10-CM | POA: Diagnosis not present

## 2012-10-12 DIAGNOSIS — M159 Polyosteoarthritis, unspecified: Secondary | ICD-10-CM | POA: Diagnosis not present

## 2012-10-12 DIAGNOSIS — IMO0001 Reserved for inherently not codable concepts without codable children: Secondary | ICD-10-CM | POA: Diagnosis not present

## 2012-10-13 ENCOUNTER — Telehealth: Payer: Self-pay

## 2012-10-13 DIAGNOSIS — M159 Polyosteoarthritis, unspecified: Secondary | ICD-10-CM | POA: Diagnosis not present

## 2012-10-13 DIAGNOSIS — M069 Rheumatoid arthritis, unspecified: Secondary | ICD-10-CM | POA: Diagnosis not present

## 2012-10-13 DIAGNOSIS — F329 Major depressive disorder, single episode, unspecified: Secondary | ICD-10-CM | POA: Diagnosis not present

## 2012-10-13 DIAGNOSIS — Z48812 Encounter for surgical aftercare following surgery on the circulatory system: Secondary | ICD-10-CM | POA: Diagnosis not present

## 2012-10-13 DIAGNOSIS — IMO0001 Reserved for inherently not codable concepts without codable children: Secondary | ICD-10-CM | POA: Diagnosis not present

## 2012-10-13 DIAGNOSIS — F3289 Other specified depressive episodes: Secondary | ICD-10-CM | POA: Diagnosis not present

## 2012-10-13 NOTE — Telephone Encounter (Signed)
Herbert Seta, RN with [facility] stating that pt was seen recent and dx bronchitis.  She was given rx for doxycycline but due to recurrent oral thrush from antibiotic, she would like an rx for diflucan. Thanks

## 2012-10-14 ENCOUNTER — Telehealth: Payer: Self-pay | Admitting: *Deleted

## 2012-10-14 MED ORDER — FLUCONAZOLE 100 MG PO TABS: ORAL_TABLET | ORAL | Status: DC

## 2012-10-14 MED ORDER — AZITHROMYCIN 250 MG PO TABS: ORAL_TABLET | ORAL | Status: DC

## 2012-10-14 NOTE — Telephone Encounter (Signed)
Pt's daughter left stating doxycycline is causing N&V. She is requesting a zpak to be sent in to [facility] in [place]. Please advise.

## 2012-10-14 NOTE — Telephone Encounter (Signed)
Ok Diflucan Thx 

## 2012-10-14 NOTE — Telephone Encounter (Signed)
Heather notified.

## 2012-10-14 NOTE — Telephone Encounter (Signed)
Ok per PCP. New Rx sent. Pt to D/C Doxycycline. Pt's daughter informed.

## 2012-10-18 DIAGNOSIS — IMO0001 Reserved for inherently not codable concepts without codable children: Secondary | ICD-10-CM | POA: Diagnosis not present

## 2012-10-18 DIAGNOSIS — M159 Polyosteoarthritis, unspecified: Secondary | ICD-10-CM | POA: Diagnosis not present

## 2012-10-18 DIAGNOSIS — Z48812 Encounter for surgical aftercare following surgery on the circulatory system: Secondary | ICD-10-CM | POA: Diagnosis not present

## 2012-10-18 DIAGNOSIS — F3289 Other specified depressive episodes: Secondary | ICD-10-CM | POA: Diagnosis not present

## 2012-10-18 DIAGNOSIS — M069 Rheumatoid arthritis, unspecified: Secondary | ICD-10-CM | POA: Diagnosis not present

## 2012-10-18 DIAGNOSIS — F329 Major depressive disorder, single episode, unspecified: Secondary | ICD-10-CM | POA: Diagnosis not present

## 2012-10-19 ENCOUNTER — Ambulatory Visit (INDEPENDENT_AMBULATORY_CARE_PROVIDER_SITE_OTHER): Payer: Medicare Other | Admitting: Physician Assistant

## 2012-10-19 ENCOUNTER — Encounter: Payer: Self-pay | Admitting: Physician Assistant

## 2012-10-19 VITALS — BP 88/58 | HR 87 | Ht 61.0 in | Wt 152.8 lb

## 2012-10-19 DIAGNOSIS — I359 Nonrheumatic aortic valve disorder, unspecified: Secondary | ICD-10-CM

## 2012-10-19 DIAGNOSIS — Z954 Presence of other heart-valve replacement: Secondary | ICD-10-CM | POA: Diagnosis not present

## 2012-10-19 DIAGNOSIS — Z95828 Presence of other vascular implants and grafts: Secondary | ICD-10-CM

## 2012-10-19 DIAGNOSIS — Z952 Presence of prosthetic heart valve: Secondary | ICD-10-CM

## 2012-10-19 NOTE — Progress Notes (Signed)
HPI:  This is a pleasant 78 year old female patient of Dr. Sherlie Ban who underwent a Bentall procedure for a descending aortic aneurysm  and a 21 mm Edwards pericardial tissue AVR 10/2012. Since discharge she has had trouble with bronchitis and thrush. She has very little appetite and has lost 15 pounds. Her blood pressure is quite low but she has been able to manage. She is trying to force herself to eat and stay hydrated but it has been difficult. She denies any chest pain, palpitations, dyspnea, dyspnea on exertion, dizziness, or presyncope.    Allergies: -- Codeine -- Other (See Comments)   --  Blood pressure drops; "I pass out."  -- Pramipexole Dihydrochloride -- Nausea Only   --  sick, falling  -- Rofecoxib -- Other (See Comments)   --  "sleep walks"  -- Arava (Leflunomide) -- Swelling  -- Clonazepam   -- Diclofenac Sodium   -- Venlafaxine   -- Alprazolam -- Other (See Comments)   --  "Makes me mean"  -- Benzodiazepines -- Other (See Comments)   --  "Halllucinations?"  -- Darvocet (Propoxyphene-Acetaminophen) -- Other (See Comments)   --  "makes my eyes dilate. Pt stated I can't see"  -- Duloxetine -- Other (See Comments)   --  achy legs  -- Gabapentin -- Other (See Comments)   --  headache  -- Latex -- Itching and Dermatitis   --  When examined with latex gloves, burns and itches            in contact areas per pt.  -- Morphine And Related -- Other (See Comments)   --  Hallucinations  -- Nortriptyline Hcl -- Other (See Comments)   --  Migraines  -- Penicillins -- Rash   --  Can take Cephalosporins  -- Prednisone -- Swelling   --  Just doesn't want to take it.  Current Outpatient Prescriptions on File Prior to Visit: aspirin EC 81 MG EC tablet, Take 1 tablet (81 mg total) by mouth daily., Disp: , Rfl:  azithromycin (ZITHROMAX) 250 MG tablet, Take 2 by mouth on day 1, then one daily on days 2-5., Disp: 6 tablet, Rfl: 0 benzonatate (TESSALON) 100 MG capsule, Take 1-2 capsules  (100-200 mg total) by mouth 2 (two) times daily as needed for cough., Disp: 100 capsule, Rfl: 0 docusate sodium 100 MG CAPS, Take 200 mg by mouth 2 (two) times daily as needed for constipation., Disp: 10 capsule, Rfl: 0 esomeprazole (NEXIUM) 40 MG capsule, Take 40 mg by mouth daily as needed. Acid reflux, Disp: , Rfl:  fluconazole (DIFLUCAN) 100 MG tablet, Take 2 tabs on day#1, then 1 tab daily on Days #2-10, Disp: 11 tablet, Rfl: 1 lidocaine (LIDODERM) 5 %, Place 3 patches onto the skin daily. Remove & Discard patch within 12 hours or as directed by MD, Disp: 90 patch, Rfl: 3 oxyCODONE (OXY IR/ROXICODONE) 5 MG immediate release tablet, Take 1-2 tablets (5-10 mg total) by mouth every 6 (six) hours as needed., Disp: 30 tablet, Rfl: 0 oxyCODONE (OXYCONTIN) 10 MG 12 hr tablet, Take 1 tablet (10 mg total) by mouth every 12 (twelve) hours as needed for pain. Please fill on 11/16/12, Disp: 60 tablet, Rfl: 0 rOPINIRole (REQUIP) 2 MG tablet, TAKE ONE TABLET BY MOUTH THREE TIMES DAILY, Disp: 90 tablet, Rfl: 0 triazolam (HALCION) 0.125 MG tablet, Take 1 tablet (0.125 mg total) by mouth at bedtime as needed., Disp: 30 tablet, Rfl: 2  No current facility-administered medications on file prior to visit.  Past Medical History:   Fibromyalgia                                                 Pulmonary embolism                                           RA (rheumatoid arthritis)                                    Vitamin B 12 deficiency                                      Vitamin D deficiency                                         Osteoarthritis                                               Osteopenia                                                   Adrenal insufficiency                                        IBS (irritable bowel syndrome)                               History of blood clots                          2008           Comment:below knee   Diabetes mellitus type II                                       Comment:pt denies this 03-05-11   Esophagitis                                                  Depression                                                   GERD (gastroesophageal reflux disease)  Connective tissue disorder                                   Cataracts, bilateral                                           Comment:more in left than right   Osteoarthritis of left shoulder                 12/07/2011     Heart murmur                                                 Leaky heart valve                                            Anginal pain                                                   Comment:"comes and goes has occured since 2012"   Bronchitis                                      Jan 2013-*     Comment:severe   Shortness of breath                                            Comment:comes from aneurysm   Vertigo                                                        Comment:hx of   Urinary leakage                                                Comment:when coughing  Past Surgical History:   ABDOMINAL HYSTERECTOMY                                        TOTAL KNEE ARTHROPLASTY                                         Comment:bilateral   CHOLECYSTECTOMY  2003         TONSILLECTOMY                                    1941         APPENDECTOMY                                     1995         WRIST SURGERY                                                   Comment:bilateral   BILATERAL OOPHORECTOMY                                        tummy tuck                                                    COSMETIC SURGERY                                                Comment:Tummy tuck   TOTAL SHOULDER ARTHROPLASTY                      12/07/2011       Comment:Procedure: TOTAL SHOULDER ARTHROPLASTY;                Surgeon: Eulas Post, MD;  Location: [facility] OR;              Service: Orthopedics;  Laterality: Left;  left                total shoulder artthroplasty   JOINT REPLACEMENT                               Bilateral              CARDIAC CATHETERIZATION                          09/15/12        Comment:no PCI   COLONOSCOPY                                                   BREAST BIOPSY                                   Left              SKIN CANCER EXCISION  Right                Comment:cheek   EYE SURGERY                                     Bilateral                Comment:Cataract   BENTALL PROCEDURE                               N/A 09/29/2012      Comment:Procedure: BENTALL PROCEDURE;  Surgeon: [name], MD;  Location: [facility] OR;  Service: Open               Heart Surgery;  Laterality: N/A;  WITH CIRC               ARREST   INTRAOPERATIVE TRANSESOPHAGEAL ECHOCARDIOGRAM   N/A 09/29/2012      Comment:Procedure: INTRAOPERATIVE TRANSESOPHAGEAL               ECHOCARDIOGRAM;  Surgeon: [name], MD;                Location: [facility] OR;  Service: Open Heart Surgery;               Laterality: N/A;  Review of patient's family history indicates:   Ovarian cancer                 Mother                   Heart disease                  Maternal Grandmother     Clotting disorder                                       Breast cancer                                           Diabetes                       Mother                   Pancreatic cancer                                       COPD                           Father                   Colon cancer                   Neg Hx                   Social History   Marital Status: Divorced            Spouse  Name:                      Years of Education:                 Number of children:             Occupational History Occupation          Associate Professor            Comment              retired                                   Social History Main Topics   Smoking Status: Never Smoker                     Smokeless Status: Never Used                        Alcohol Use: Yes               Comment: occassional   Drug Use: No             Sexual Activity: Not Currently      Other Topics            Concern   None on file  Social History Narrative   None on file    ROS: Overall weakness, otherwise see history of present illness   PHYSICAL EXAM: Pale and tired, in no acute distress. Neck: No JVD, HJR, Bruit, or thyroid enlargement  Lungs: No tachypnea, clear without wheezing, rales, or rhonchi  Cardiovascular: RRR, PMI not displaced, heart sounds distant, no murmurs, gallops, bruit, thrill, or heave.  Abdomen: BS normal. Soft without organomegaly, masses, lesions or tenderness.  Extremities: without cyanosis, clubbing or edema. Good distal pulses bilateral  SKin: Warm, no lesions or rashes   Musculoskeletal: No deformities  Neuro: no focal signs  BP 88/58  Pulse 87  Ht 5\' 1"  (1.549 m)  Wt 152 lb 12.8 oz (69.31 kg)  BMI 28.89 kg/m2   EKG: Normal sinus rhythm, poor R wave progression anteriorly, no acute change

## 2012-10-19 NOTE — Patient Instructions (Addendum)
The current medical regimen is effective;  continue present plan and medications.  Follow up in two months with Dr Shirlee Latch as scheduled.

## 2012-10-19 NOTE — Assessment & Plan Note (Signed)
Overall patient is doing well from a cardiac standpoint. She has low blood pressure and has had trouble eating due to  thrush. She is trying to force herself to stay hydrated. 

## 2012-10-19 NOTE — Assessment & Plan Note (Signed)
Overall patient is doing well from a cardiac standpoint. She has low blood pressure and has had trouble eating due to  thrush. She is trying to force herself to stay hydrated.

## 2012-10-20 ENCOUNTER — Other Ambulatory Visit: Payer: Self-pay | Admitting: *Deleted

## 2012-10-20 DIAGNOSIS — I714 Abdominal aortic aneurysm, without rupture, unspecified: Secondary | ICD-10-CM

## 2012-10-20 DIAGNOSIS — M069 Rheumatoid arthritis, unspecified: Secondary | ICD-10-CM | POA: Diagnosis not present

## 2012-10-20 DIAGNOSIS — M159 Polyosteoarthritis, unspecified: Secondary | ICD-10-CM | POA: Diagnosis not present

## 2012-10-20 DIAGNOSIS — Z48812 Encounter for surgical aftercare following surgery on the circulatory system: Secondary | ICD-10-CM | POA: Diagnosis not present

## 2012-10-20 DIAGNOSIS — F3289 Other specified depressive episodes: Secondary | ICD-10-CM | POA: Diagnosis not present

## 2012-10-20 DIAGNOSIS — F329 Major depressive disorder, single episode, unspecified: Secondary | ICD-10-CM | POA: Diagnosis not present

## 2012-10-20 DIAGNOSIS — IMO0001 Reserved for inherently not codable concepts without codable children: Secondary | ICD-10-CM | POA: Diagnosis not present

## 2012-10-21 DIAGNOSIS — IMO0001 Reserved for inherently not codable concepts without codable children: Secondary | ICD-10-CM | POA: Diagnosis not present

## 2012-10-21 DIAGNOSIS — F329 Major depressive disorder, single episode, unspecified: Secondary | ICD-10-CM | POA: Diagnosis not present

## 2012-10-21 DIAGNOSIS — M159 Polyosteoarthritis, unspecified: Secondary | ICD-10-CM | POA: Diagnosis not present

## 2012-10-21 DIAGNOSIS — F3289 Other specified depressive episodes: Secondary | ICD-10-CM | POA: Diagnosis not present

## 2012-10-21 DIAGNOSIS — Z48812 Encounter for surgical aftercare following surgery on the circulatory system: Secondary | ICD-10-CM | POA: Diagnosis not present

## 2012-10-21 DIAGNOSIS — M069 Rheumatoid arthritis, unspecified: Secondary | ICD-10-CM | POA: Diagnosis not present

## 2012-10-25 DIAGNOSIS — IMO0001 Reserved for inherently not codable concepts without codable children: Secondary | ICD-10-CM | POA: Diagnosis not present

## 2012-10-25 DIAGNOSIS — M159 Polyosteoarthritis, unspecified: Secondary | ICD-10-CM | POA: Diagnosis not present

## 2012-10-25 DIAGNOSIS — F3289 Other specified depressive episodes: Secondary | ICD-10-CM | POA: Diagnosis not present

## 2012-10-25 DIAGNOSIS — F329 Major depressive disorder, single episode, unspecified: Secondary | ICD-10-CM | POA: Diagnosis not present

## 2012-10-25 DIAGNOSIS — Z48812 Encounter for surgical aftercare following surgery on the circulatory system: Secondary | ICD-10-CM | POA: Diagnosis not present

## 2012-10-25 DIAGNOSIS — M069 Rheumatoid arthritis, unspecified: Secondary | ICD-10-CM | POA: Diagnosis not present

## 2012-10-26 ENCOUNTER — Other Ambulatory Visit: Payer: Self-pay | Admitting: Surgery

## 2012-10-26 ENCOUNTER — Ambulatory Visit
Admission: RE | Admit: 2012-10-26 | Discharge: 2012-10-26 | Disposition: A | Discharge status: Home / Self Care | Payer: Medicare Other | Source: Ambulatory Visit | Attending: Surgery | Admitting: Surgery

## 2012-10-26 ENCOUNTER — Encounter: Payer: Self-pay | Admitting: Surgery

## 2012-10-26 ENCOUNTER — Ambulatory Visit (INDEPENDENT_AMBULATORY_CARE_PROVIDER_SITE_OTHER): Payer: Medicare Other | Admitting: Surgery

## 2012-10-26 VITALS — BP 99/62 | HR 98 | Resp 16 | Ht 61.25 in | Wt 165.0 lb

## 2012-10-26 DIAGNOSIS — I712 Thoracic aortic aneurysm, without rupture: Secondary | ICD-10-CM

## 2012-10-26 DIAGNOSIS — J90 Pleural effusion, not elsewhere classified: Secondary | ICD-10-CM | POA: Diagnosis not present

## 2012-10-26 DIAGNOSIS — I714 Abdominal aortic aneurysm, without rupture: Secondary | ICD-10-CM

## 2012-10-26 DIAGNOSIS — I359 Nonrheumatic aortic valve disorder, unspecified: Secondary | ICD-10-CM

## 2012-10-26 DIAGNOSIS — Z09 Encounter for follow-up examination after completed treatment for conditions other than malignant neoplasm: Secondary | ICD-10-CM

## 2012-10-26 DIAGNOSIS — D500 Iron deficiency anemia secondary to blood loss (chronic): Secondary | ICD-10-CM | POA: Diagnosis not present

## 2012-10-26 LAB — CBC
HCT: 33.2 % — ABNORMAL LOW (ref 36.0–46.0)
Hemoglobin: 10.9 g/dL — ABNORMAL LOW (ref 12.0–15.0)
WBC: 4.1 10*3/uL (ref 4.0–10.5)

## 2012-10-26 NOTE — Progress Notes (Signed)
[address]       [place] [address]             [phone]        HPI:  Patient returns for routine postoperative follow-up having undergone Bentall procedure using a composite pericardial valve/conduit and replacement of the ascending aorta and proximal aortic arch on 09/29/2012. The patient's early postoperative recovery while in the hospital was notable for an uncomplicated postop course. Since hospital discharge the patient reports she was feeling well until the past 3 days when she has been feeling weak and tired.She denies chest pain and shortness of breath. She feels like she is anemic. She has been having normal bowel movements with no red blood or melena. She has had problems with thrush since discharge that is being managed by Dr. Posey Rea.   Current Outpatient Prescriptions  Medication Sig Dispense Refill  . aspirin EC 81 MG EC tablet Take 1 tablet (81 mg total) by mouth daily.      . benzonatate (TESSALON) 100 MG capsule Take 1-2 capsules (100-200 mg total) by mouth 2 (two) times daily as needed for cough.  100 capsule  0  . docusate sodium 100 MG CAPS Take 200 mg by mouth 2 (two) times daily as needed for constipation.  10 capsule  0  . esomeprazole (NEXIUM) 40 MG capsule Take 40 mg by mouth daily as needed. Acid reflux      . lidocaine (LIDODERM) 5 % Place 3 patches onto the skin daily. Remove & Discard patch within 12 hours or as directed by MD  90 patch  3  . oxyCODONE (OXY IR/ROXICODONE) 5 MG immediate release tablet Take 1-2 tablets (5-10 mg total) by mouth every 6 (six) hours as needed.  30 tablet  0  . rOPINIRole (REQUIP) 2 MG tablet TAKE ONE TABLET BY MOUTH THREE TIMES DAILY  90 tablet  0  . triazolam (HALCION) 0.125 MG tablet Take 1 tablet (0.125 mg total) by mouth at bedtime as needed.  30 tablet  2   No current facility-administered medications for this visit.    Physical Exam: BP 99/62  Pulse 98  Resp 16  Ht 5' 1.25" (1.556 m)  Wt  165 lb (74.844 kg)  BMI 30.91 kg/m2  SpO2 97% She looks pale and somewhat weak Cardiac exam shows a regular rate and rhythm with normal prosthetic valve sounds. There is no murmur. Lung exam is clear. The chest incision is healing well and sternum is stable. There is no peripheral edema.  Diagnostic Tests:  *RADIOLOGY REPORT*   Clinical Data: Postop aneurysm repair.   CHEST - 2 VIEW   Comparison: 10/10/2012.   Findings: Trachea is midline.  Heart size stable.  Descending thoracic aorta is tortuous and the transverse portion appears prominent, as before.  Probable mild scarring in the lingula with improved aeration at the left lung base.  Tiny residual bilateral pleural effusions.  No edema.   IMPRESSION: Tiny bilateral pleural effusions.     Original Report Authenticated By: Leanna Battles, M.D.   Impression:  Overall I think she is doing fairly well following major cardiac surgery. I not sure why she has been tired and weak for the past 3 days after feeling fairly well at home up to that point. She does look a little pale and I will send her for a CBC to check her hemoglobin today. She said that she does have a history of chronic anemia and her  last hemoglobin prior to discharge was 8.6. Her vital signs are stable. She is on multiple medications for her fibromyalgia but she's been on them for quite some time and tolerates them well. I told her she can return to driving a car when she feels comfortable with that. I asked her not to lift anything heavier than 10 pounds for 3 months postoperatively.  Plan:  She will have her hemoglobin checked today and we will notify her of the results. I will plan to see her back in 3 weeks for followup.

## 2012-10-27 DIAGNOSIS — IMO0001 Reserved for inherently not codable concepts without codable children: Secondary | ICD-10-CM | POA: Diagnosis not present

## 2012-10-27 DIAGNOSIS — F3289 Other specified depressive episodes: Secondary | ICD-10-CM | POA: Diagnosis not present

## 2012-10-27 DIAGNOSIS — M159 Polyosteoarthritis, unspecified: Secondary | ICD-10-CM | POA: Diagnosis not present

## 2012-10-27 DIAGNOSIS — M069 Rheumatoid arthritis, unspecified: Secondary | ICD-10-CM | POA: Diagnosis not present

## 2012-10-27 DIAGNOSIS — Z48812 Encounter for surgical aftercare following surgery on the circulatory system: Secondary | ICD-10-CM | POA: Diagnosis not present

## 2012-10-27 DIAGNOSIS — F329 Major depressive disorder, single episode, unspecified: Secondary | ICD-10-CM | POA: Diagnosis not present

## 2012-10-28 DIAGNOSIS — M069 Rheumatoid arthritis, unspecified: Secondary | ICD-10-CM | POA: Diagnosis not present

## 2012-10-28 DIAGNOSIS — IMO0001 Reserved for inherently not codable concepts without codable children: Secondary | ICD-10-CM | POA: Diagnosis not present

## 2012-10-28 DIAGNOSIS — M159 Polyosteoarthritis, unspecified: Secondary | ICD-10-CM | POA: Diagnosis not present

## 2012-10-28 DIAGNOSIS — Z48812 Encounter for surgical aftercare following surgery on the circulatory system: Secondary | ICD-10-CM | POA: Diagnosis not present

## 2012-10-28 DIAGNOSIS — F3289 Other specified depressive episodes: Secondary | ICD-10-CM | POA: Diagnosis not present

## 2012-10-28 DIAGNOSIS — F329 Major depressive disorder, single episode, unspecified: Secondary | ICD-10-CM | POA: Diagnosis not present

## 2012-11-03 DIAGNOSIS — Z95828 Presence of other vascular implants and grafts: Secondary | ICD-10-CM

## 2012-11-03 DIAGNOSIS — Z954 Presence of other heart-valve replacement: Secondary | ICD-10-CM

## 2012-11-03 DIAGNOSIS — F3289 Other specified depressive episodes: Secondary | ICD-10-CM | POA: Diagnosis not present

## 2012-11-03 DIAGNOSIS — F329 Major depressive disorder, single episode, unspecified: Secondary | ICD-10-CM

## 2012-11-03 DIAGNOSIS — R05 Cough: Secondary | ICD-10-CM | POA: Diagnosis not present

## 2012-11-03 DIAGNOSIS — R059 Cough, unspecified: Secondary | ICD-10-CM | POA: Diagnosis not present

## 2012-11-16 ENCOUNTER — Ambulatory Visit (INDEPENDENT_AMBULATORY_CARE_PROVIDER_SITE_OTHER): Payer: Self-pay | Admitting: Surgery

## 2012-11-16 ENCOUNTER — Encounter: Payer: Self-pay | Admitting: Surgery

## 2012-11-16 VITALS — BP 115/77 | HR 104 | Resp 20 | Ht 61.25 in | Wt 165.0 lb

## 2012-11-16 DIAGNOSIS — I351 Nonrheumatic aortic (valve) insufficiency: Secondary | ICD-10-CM

## 2012-11-16 DIAGNOSIS — I712 Thoracic aortic aneurysm, without rupture: Secondary | ICD-10-CM

## 2012-11-16 DIAGNOSIS — I7121 Aneurysm of the ascending aorta, without rupture: Secondary | ICD-10-CM

## 2012-11-16 DIAGNOSIS — Z09 Encounter for follow-up examination after completed treatment for conditions other than malignant neoplasm: Secondary | ICD-10-CM

## 2012-11-16 DIAGNOSIS — I359 Nonrheumatic aortic valve disorder, unspecified: Secondary | ICD-10-CM

## 2012-11-16 NOTE — Progress Notes (Signed)
      [address]       [place] [address]             [phone]        HPI:  Patient returns for postoperative follow-up having undergone Bentall procedure using a composite pericardial valve/conduit and replacement of the ascending aorta and proximal aortic arch on 09/29/2012. The patient's early postoperative recovery while in the hospital was notable for an uncomplicated postop course. When I last saw her on 10/26/2012 she was reporting a few day history of feeling weak. She thought that she had thrush and was being treated by Dr. Posey Rea with a brief course of diflucan. Her Hgb was checked and it was around 10. She says she has continued to feel bad with what she thinks is candida esophagitis. She says that she has had this many times before. She is barely able to eat due to burning pain with swallowing.     Current Outpatient Prescriptions  Medication Sig Dispense Refill  . aspirin EC 81 MG EC tablet Take 1 tablet (81 mg total) by mouth daily.      . benzonatate (TESSALON) 100 MG capsule Take 1-2 capsules (100-200 mg total) by mouth 2 (two) times daily as needed for cough.  100 capsule  0  . esomeprazole (NEXIUM) 40 MG capsule Take 40 mg by mouth daily as needed. Acid reflux      . lidocaine (LIDODERM) 5 % Place 3 patches onto the skin daily. Remove & Discard patch within 12 hours or as directed by MD  90 patch  3  . Probiotic Product (PROBIOTIC DAILY PO) Take by mouth daily.      Marland Kitchen rOPINIRole (REQUIP) 2 MG tablet TAKE ONE TABLET BY MOUTH THREE TIMES DAILY  90 tablet  0  . triazolam (HALCION) 0.125 MG tablet Take 1 tablet (0.125 mg total) by mouth at bedtime as needed.  30 tablet  2   No current facility-administered medications for this visit.     Physical Exam: BP 115/77  Pulse 104  Resp 20  Ht 5' 1.25" (1.556 m)  Wt 165 lb (74.844 kg)  BMI 30.91 kg/m2  SpO2 98% She looks tired and weak There is no oral thrush Lung exam is clear Cardiac exam shows  a regular rate and rhythm with norma heart valve sounds. The incision is healing well There is no peripheral edema   Impression:  I think she probably does have candida esophagitis. She has had long-standing problems with immune compromise and did get periop broad spectrum antibiotics. I will treat her with diflucan 200 mg per day for 3 weeks. She has had normal LFT's so she should tolerate this.   Plan:  Diflucan 200 mg per day for 3 weeks. I will see her back in 3 wks for follow-up. She will let me know if this does not start to improve over the next week.

## 2012-11-21 ENCOUNTER — Ambulatory Visit: Payer: Medicare Other | Admitting: Cardiology

## 2012-11-24 ENCOUNTER — Other Ambulatory Visit: Payer: Self-pay | Admitting: Internal Medicine

## 2012-11-25 ENCOUNTER — Encounter: Payer: Self-pay | Admitting: Internal Medicine

## 2012-11-25 ENCOUNTER — Ambulatory Visit (INDEPENDENT_AMBULATORY_CARE_PROVIDER_SITE_OTHER): Payer: Medicare Other | Admitting: Internal Medicine

## 2012-11-25 ENCOUNTER — Other Ambulatory Visit (INDEPENDENT_AMBULATORY_CARE_PROVIDER_SITE_OTHER): Payer: Medicare Other

## 2012-11-25 VITALS — BP 100/70 | HR 76 | Temp 98.3°F | Resp 16 | Wt 147.0 lb

## 2012-11-25 DIAGNOSIS — J449 Chronic obstructive pulmonary disease, unspecified: Secondary | ICD-10-CM

## 2012-11-25 DIAGNOSIS — F329 Major depressive disorder, single episode, unspecified: Secondary | ICD-10-CM

## 2012-11-25 DIAGNOSIS — Z954 Presence of other heart-valve replacement: Secondary | ICD-10-CM

## 2012-11-25 DIAGNOSIS — E538 Deficiency of other specified B group vitamins: Secondary | ICD-10-CM

## 2012-11-25 DIAGNOSIS — J4489 Other specified chronic obstructive pulmonary disease: Secondary | ICD-10-CM

## 2012-11-25 DIAGNOSIS — Z952 Presence of prosthetic heart valve: Secondary | ICD-10-CM

## 2012-11-25 DIAGNOSIS — F3289 Other specified depressive episodes: Secondary | ICD-10-CM

## 2012-11-25 DIAGNOSIS — B370 Candidal stomatitis: Secondary | ICD-10-CM

## 2012-11-25 DIAGNOSIS — IMO0001 Reserved for inherently not codable concepts without codable children: Secondary | ICD-10-CM | POA: Diagnosis not present

## 2012-11-25 LAB — HEPATIC FUNCTION PANEL
ALT: 11 U/L (ref 0–35)
Alkaline Phosphatase: 66 U/L (ref 39–117)
Bilirubin, Direct: 0.1 mg/dL (ref 0.0–0.3)
Total Protein: 7.3 g/dL (ref 6.0–8.3)

## 2012-11-25 LAB — CBC WITH DIFFERENTIAL/PLATELET
Eosinophils Relative: 3.2 % (ref 0.0–5.0)
MCV: 88.9 fl (ref 78.0–100.0)
Monocytes Absolute: 0.3 10*3/uL (ref 0.1–1.0)
Monocytes Relative: 7.5 % (ref 3.0–12.0)
Neutrophils Relative %: 60.8 % (ref 43.0–77.0)
Platelets: 184 10*3/uL (ref 150.0–400.0)
WBC: 4.6 10*3/uL (ref 4.5–10.5)

## 2012-11-25 LAB — BASIC METABOLIC PANEL
BUN: 19 mg/dL (ref 6–23)
Creatinine, Ser: 1.3 mg/dL — ABNORMAL HIGH (ref 0.4–1.2)
GFR: 44.23 mL/min — ABNORMAL LOW (ref >60.00)

## 2012-11-25 MED ORDER — OXYCODONE HCL 10 MG PO TB12: 10.0000 mg | ORAL_TABLET | Freq: Every day | ORAL | Status: DC | PRN

## 2012-11-25 MED ORDER — TRIAMCINOLONE ACETONIDE 0.5 % EX OINT: TOPICAL_OINTMENT | Freq: Two times a day (BID) | CUTANEOUS | Status: DC

## 2012-11-25 NOTE — Assessment & Plan Note (Signed)
Continue with current prescription therapy as reflected on the Med list.  

## 2012-11-25 NOTE — Progress Notes (Signed)
Subjective:      HPI  C/o cough w/clear slime w/eating C/o thrush. Dr Laneta Simmers started her Diflucan - feeling better a little  F/u R CTS  and neck pain - Dr Jeral Fruit is planning to do surgeries: she had a myelogram   S/p thor AA and AR recently repaired. AVR is repaired Dr Laneta Simmers - she went home last Wed  S/p L shoulder replacement 12/07/11 by Dr Dion Saucier. F/u pain in L arm - better   The patient is here to follow up on chronic depression, anxiety, headaches and chronic moderate fibromyalgia symptoms controlled with medicines, stable. Xarelto - we switched her to it due to Lovenox cost a few months ago. Xarelto is $400/mo.Marland KitchenMarland KitchenShe is on Sympony for RA q 2 mo  Wt Readings from Last 3 Encounters:  11/25/12 147 lb (66.679 kg)  11/16/12 165 lb (74.844 kg)  10/26/12 165 lb (74.844 kg)   BP Readings from Last 3 Encounters:  11/25/12 100/70  11/16/12 115/77  10/26/12 99/62   Past Medical History  Diagnosis Date  . Fibromyalgia   . Pulmonary embolism   . RA (rheumatoid arthritis)   . Vitamin B 12 deficiency   . Vitamin D deficiency   . Osteoarthritis   . Osteopenia   . Adrenal insufficiency   . IBS (irritable bowel syndrome)   . History of blood clots 2008    below knee  . Diabetes mellitus type II     pt denies this 03-05-11  . Esophagitis   . Depression   . GERD (gastroesophageal reflux disease)   . Connective tissue disorder   . Cataracts, bilateral     more in left than right  . Osteoarthritis of left shoulder 12/07/2011  . Heart murmur   . Leaky heart valve   . Anginal pain     "comes and goes has occured since 2012"  . Bronchitis Jan 2013-March 2013    severe  . Shortness of breath     comes from aneurysm  . Vertigo     hx of  . Urinary leakage     when coughing   Past Surgical History  Procedure Laterality Date  . Abdominal hysterectomy    . Total knee arthroplasty      bilateral  . Cholecystectomy  2003  . Tonsillectomy  1941  . Appendectomy  1995  . Wrist  surgery      bilateral  . Bilateral oophorectomy    . Tummy tuck    . Cosmetic surgery      Tummy tuck  . Total shoulder arthroplasty  12/07/2011    Procedure: TOTAL SHOULDER ARTHROPLASTY;  Surgeon: Eulas Post, MD;  Location: [facility] OR;  Service: Orthopedics;  Laterality: Left;  left total shoulder artthroplasty  . Joint replacement Bilateral   . Cardiac catheterization  09/15/12    no PCI  . Colonoscopy    . Breast biopsy Left   . Skin cancer excision Right     cheek  . Eye surgery Bilateral     Cataract  . Bentall procedure N/A 09/29/2012    Procedure: BENTALL PROCEDURE;  Surgeon: [name], MD;  Location: [facility] OR;  Service: Open Heart Surgery;  Laterality: N/A;  WITH CIRC ARREST  . Intraoperative transesophageal echocardiogram N/A 09/29/2012    Procedure: INTRAOPERATIVE TRANSESOPHAGEAL ECHOCARDIOGRAM;  Surgeon: [name], MD;  Location: [facility] OR;  Service: Open Heart Surgery;  Laterality: N/A;    reports that she has never smoked. She has never  used smokeless tobacco. She reports that  drinks alcohol. She reports that she does not use illicit drugs. family history includes Breast cancer in an unspecified family member; COPD in her father; Clotting disorder in an unspecified family member; Diabetes in her mother; Heart disease in her maternal grandmother; Ovarian cancer in her mother; and Pancreatic cancer in an unspecified family member.  There is no history of Colon cancer. Allergies  Allergen Reactions  . Codeine Other (See Comments)    Blood pressure drops; "I pass out."  . Pramipexole Dihydrochloride Nausea Only    sick, falling  . Rofecoxib Other (See Comments)    "sleep walks"  . Arava (Leflunomide) Swelling  . Clonazepam   . Diclofenac Sodium   . Venlafaxine   . Alprazolam Other (See Comments)    "Makes me mean"  . Benzodiazepines Other (See Comments)    "Halllucinations?"  . Darvocet (Propoxyphene-Acetaminophen) Other (See Comments)    "makes my eyes dilate. Pt  stated I can't see"  . Duloxetine Other (See Comments)    achy legs  . Gabapentin Other (See Comments)    headache  . Latex Itching and Dermatitis    When examined with latex gloves, burns and itches in contact areas per pt.  . Morphine And Related Other (See Comments)    Hallucinations   . Nortriptyline Hcl Other (See Comments)    Migraines   . Penicillins Rash    Can take Cephalosporins  . Prednisone Swelling    Just doesn't want to take it.   Current Outpatient Prescriptions on File Prior to Visit  Medication Sig Dispense Refill  . aspirin EC 81 MG EC tablet Take 1 tablet (81 mg total) by mouth daily.      . benzonatate (TESSALON) 100 MG capsule Take 1-2 capsules (100-200 mg total) by mouth 2 (two) times daily as needed for cough.  100 capsule  0  . esomeprazole (NEXIUM) 40 MG capsule Take 40 mg by mouth daily as needed. Acid reflux      . lidocaine (LIDODERM) 5 % Place 3 patches onto the skin daily. Remove & Discard patch within 12 hours or as directed by MD  90 patch  3  . Probiotic Product (PROBIOTIC DAILY PO) Take by mouth daily.      Marland Kitchen rOPINIRole (REQUIP) 2 MG tablet TAKE ONE TABLET BY MOUTH THREE TIMES DAILY  90 tablet  0  . triazolam (HALCION) 0.125 MG tablet Take 1 tablet (0.125 mg total) by mouth at bedtime as needed.  30 tablet  2   No current facility-administered medications on file prior to visit.      Review of Systems  Constitutional: Negative for activity change, appetite change, fatigue and unexpected weight change.  HENT: Positive for sinus pressure. Negative for congestion and mouth sores.   Eyes: Negative for visual disturbance.  Respiratory: Negative for chest tightness.   Gastrointestinal: Negative for nausea and abdominal pain.  Genitourinary: Negative for frequency, difficulty urinating and vaginal pain.  Musculoskeletal: Positive for back pain and arthralgias. Negative for gait problem.  Skin: Negative for pallor.  Neurological: Negative for  dizziness, tremors, weakness and numbness.  Psychiatric/Behavioral: Negative for confusion and sleep disturbance.   BP 100/70  Pulse 76  Temp(Src) 98.3 F (36.8 C) (Oral)  Resp 16  Wt 147 lb (66.679 kg)  BMI 27.54 kg/m2     Objective:   Physical Exam  Constitutional: She appears well-developed and well-nourished. No distress.  HENT:  Head: Normocephalic.  Right Ear:  External ear normal.  Left Ear: External ear normal.  Nose: Nose normal.  Mouth/Throat: Oropharynx is clear and moist.  Eyes: Conjunctivae are normal. Pupils are equal, round, and reactive to light. Right eye exhibits no discharge. Left eye exhibits no discharge.  Neck: Normal range of motion. Neck supple. No JVD present. No tracheal deviation present. No thyromegaly present.  Cardiovascular: Normal rate, regular rhythm and normal heart sounds.   Pulmonary/Chest: No stridor. No respiratory distress. She has no wheezes.  Abdominal: Soft. Bowel sounds are normal. She exhibits no distension and no mass. There is no tenderness. There is no rebound and no guarding.  Musculoskeletal: She exhibits tenderness. She exhibits no edema.  L shoulder is not tender w/ROM Neck is tender w/ROM  Lymphadenopathy:    She has no cervical adenopathy.  Neurological: She displays normal reflexes. No cranial nerve deficit. She exhibits normal muscle tone. Coordination normal.  Skin: No rash noted. No erythema.  Psychiatric: She has a normal mood and affect. Her behavior is normal. Judgment and thought content normal.  sad  spitting up saliva  Lab Results  Component Value Date   WBC 4.1 10/26/2012   HGB 10.9* 10/26/2012   HCT 33.2* 10/26/2012   PLT 203 10/26/2012   GLUCOSE 117* 10/05/2012   ALT 12 09/27/2012   AST 23 09/27/2012   NA 138 10/05/2012   K 3.4* 10/05/2012   CL 104 10/05/2012   CREATININE 0.85 10/05/2012   BUN 12 10/05/2012   CO2 28 10/05/2012   TSH 2.88 01/09/2011   INR 1.34 09/29/2012   HGBA1C 5.7* 09/27/2012           Assessment  & Plan:

## 2012-11-25 NOTE — Assessment & Plan Note (Signed)
Doing well 

## 2012-11-25 NOTE — Assessment & Plan Note (Signed)
Recurrent post - abx Usually it takes her 8 wks of Diflucan to get rid of it - Dr Laneta Simmers prescribed recently (per pt) Labs GI cons Dr Marina Gravel free trial (no wheat products) x4-6 weeks. OK to use Gluten-free bread and pasta. Milk free trial (no milk, ice cream and yogurt) x4 weeks. OK to use almond or soy milk.

## 2012-11-25 NOTE — Patient Instructions (Signed)
Gluten free trial (no wheat products) x4-6 weeks. OK to use Gluten-free bread and pasta. Milk free trial (no milk, ice cream and yogurt) x4 weeks. OK to use almond or soy milk. 

## 2012-11-25 NOTE — Assessment & Plan Note (Signed)
Doing ok.

## 2012-11-28 ENCOUNTER — Ambulatory Visit (INDEPENDENT_AMBULATORY_CARE_PROVIDER_SITE_OTHER): Payer: Medicare Other | Admitting: Cardiology

## 2012-11-28 ENCOUNTER — Encounter: Payer: Self-pay | Admitting: Cardiology

## 2012-11-28 VITALS — BP 118/84 | HR 88 | Ht 61.25 in | Wt 146.4 lb

## 2012-11-28 DIAGNOSIS — Z95828 Presence of other vascular implants and grafts: Secondary | ICD-10-CM | POA: Diagnosis not present

## 2012-11-28 DIAGNOSIS — I82409 Acute embolism and thrombosis of unspecified deep veins of unspecified lower extremity: Secondary | ICD-10-CM

## 2012-11-28 DIAGNOSIS — Z954 Presence of other heart-valve replacement: Secondary | ICD-10-CM | POA: Diagnosis not present

## 2012-11-28 DIAGNOSIS — Z952 Presence of prosthetic heart valve: Secondary | ICD-10-CM

## 2012-11-28 NOTE — Patient Instructions (Signed)
You have been referred to Cardiac Rehab at [facility].   Your physician recommends that you schedule a follow-up appointment in: 4 months with Dr Shirlee Latch. (November 2014).

## 2012-11-29 ENCOUNTER — Encounter: Payer: Self-pay | Admitting: Internal Medicine

## 2012-11-29 NOTE — Telephone Encounter (Signed)
Error

## 2012-11-29 NOTE — Progress Notes (Signed)
Patient ID: Diane Abbott, female   DOB: [birth year], 78 y.o.   MRN: [id number] PCP: Dr. Posey Rea  77 yo with history of OA, RA, PE/DVT, and status post Bentall bioprosthetic valved conduit and ascending aorta/proximal arch replacement presents for followup.  She had been having problems with C-spine arthritis.  She had an MRI of her neck done, which noted ascending aortic aneurysm.  Therefore, CTA chest was done.  This showed a 5.8 cm ascending aortic aneurysm extending to the proximal arch.  Echo showed moderate AI with trileaflet aortic valve.  LHC showed no significant cardiac disease and 3+ AI.    In 5/14, she had Bentall procedure with 21 mm bioprosthetic valved conduit as well as replacement of the ascending aorta and proximal arch.  Post-op echo showed EF 50%, well-seated bioprosthetic aortic valve.   Since discharge, she seems to have done well.  She has mild dyspnea with heavy housework like loading the dishwasher. No edema.  No chest pain.  She is not very active.  She has been taking Diflucan for candidal esophagitis.  Labs (4/14): K 4.4, creatinine 1.3 Labs (7/14): K 4.5, creatinine 1.3, LFTs normal  PMH: 1. C-spine OA 2. Left shoulder replacement in 8/13.  3. Bilateral TKR.  4. Depression 5. Anxiety 6. Fibromyalgia 7. Rheumatoid arthritis.  She is getting Sympony injections.  8. 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  Last event was in 2008.  Xarelto stopped by heme/onc in 2014. 9. GERD 10. Vitamin B12 deficiency.  11. Cholecystectomy 12. Abdominal hysterectomy 13. Ascending aortic aneurysm: 4.5 cm by CT in 2009.  5.8 cm by CTA chest in 4/14.  In 5/14, she had Bentall procedure with 21 mm bioprosthetic valved conduit as well as replacement of the ascending aorta and proximal arch.  Post-op echo (6/14) showed EF 50%, septal dyssynergy, well-seated bioprosthetic aortic valve with mean gradient 16 mmHg.  14. Aortic insufficiency: Echo (4/14) with EF  60-65%, moderate AI, trileaflet aortic valve that incompletely coapts.  LHC showed 3+ AI in 5/14.  Aortic valve replaced with bioprosthetic valved conduit in 5/14.  15. LHC (5/14) with no significant coronary disease.   16. Candidal esophagitis  SH: Lives in [place], nonsmoker, occasional ETOH.   FH: Familial hypercoagulable state.  Grandmother with "heart problem."    Current Outpatient Prescriptions  Medication Sig Dispense Refill  . aspirin EC 81 MG EC tablet Take 1 tablet (81 mg total) by mouth daily.      . benzonatate (TESSALON) 100 MG capsule Take 1-2 capsules (100-200 mg total) by mouth 2 (two) times daily as needed for cough.  100 capsule  0  . esomeprazole (NEXIUM) 40 MG capsule Take 40 mg by mouth daily as needed. Acid reflux      . lidocaine (LIDODERM) 5 % Place 3 patches onto the skin daily. Remove & Discard patch within 12 hours or as directed by MD  90 patch  3  . oxyCODONE (OXYCONTIN) 10 MG 12 hr tablet Take 1 tablet (10 mg total) by mouth daily as needed for pain. Please fill on or after 01/17/13  60 tablet  0  . Probiotic Product (PROBIOTIC DAILY PO) Take by mouth daily.      Marland Kitchen rOPINIRole (REQUIP) 2 MG tablet TAKE ONE TABLET BY MOUTH THREE TIMES DAILY  90 tablet  0  . triamcinolone ointment (KENALOG) 0.5 % Apply topically 2 (two) times daily.  30 g  0  . triazolam (HALCION) 0.125 MG tablet Take 1  tablet (0.125 mg total) by mouth at bedtime as needed.  30 tablet  2   No current facility-administered medications for this visit.   BP 118/84  Pulse 88  Ht 5' 1.25" (1.556 m)  Wt 66.407 kg (146 lb 6.4 oz)  BMI 27.43 kg/m2  SpO2 97% General: NAD Neck: JVP 7 cm, no thyromegaly or thyroid nodule.  Lungs: Clear to auscultation bilaterally with normal respiratory effort. CV: Nondisplaced PMI.  Heart regular S1/S2, no S3/S4, 2/6 SEM.  No peripheral edema.  No carotid bruit.  Normal pedal pulses.  Abdomen: Soft, nontender, no hepatosplenomegaly, no distention.  Skin: Intact  without lesions or rashes.  Neurologic: Alert and oriented x 3.  Psych: Normal affect. Extremities: No clubbing or cyanosis.  HEENT: Normal.   Assessment/Plan: 1. Ascending aortic aneurysm: Now status post graft replacement of the ascending aorta and the proximal arch.  To followup with CVTS.  I will set patient up with cardiac rehab.  2. Aortic insufficiency: 3+ AI. Therefore, patient had bioprosthetic valved conduit placed when ascending aorta was repaired.  Valve was well-seated on post-op echo in 6/14.  3. History of venous thromboembolism: Xarelto was stopped by heme/onc.  Followup in 4 months.    Marca Ancona 11/29/2012

## 2012-11-30 ENCOUNTER — Encounter: Payer: Self-pay | Admitting: *Deleted

## 2012-12-02 HISTORY — PX: AORTA SURGERY: SHX548

## 2012-12-06 ENCOUNTER — Telehealth: Payer: Self-pay | Admitting: Cardiology

## 2012-12-06 ENCOUNTER — Other Ambulatory Visit: Payer: Self-pay | Admitting: *Deleted

## 2012-12-06 DIAGNOSIS — I712 Thoracic aortic aneurysm, without rupture: Secondary | ICD-10-CM

## 2012-12-06 NOTE — Telephone Encounter (Signed)
New problem   Lucia/Dr Deveshwar need a note for pt to resume her simponi aria after surgery. Please fax to [phone].

## 2012-12-06 NOTE — Telephone Encounter (Signed)
Spoke with [place]. Dr Corliss Skains is requesting OK to resume simponi Jarold Song post surgery due to potential for infection taking this medication. I recommended that she contact Dr Laneta Simmers TCTS to get OK to resume medication.

## 2012-12-06 NOTE — Telephone Encounter (Signed)
LMTCB

## 2012-12-07 ENCOUNTER — Ambulatory Visit (INDEPENDENT_AMBULATORY_CARE_PROVIDER_SITE_OTHER): Payer: Self-pay | Admitting: Surgery

## 2012-12-07 ENCOUNTER — Encounter: Payer: Self-pay | Admitting: Surgery

## 2012-12-07 VITALS — BP 116/68 | HR 89 | Resp 16 | Ht 61.25 in | Wt 149.5 lb

## 2012-12-07 DIAGNOSIS — Z09 Encounter for follow-up examination after completed treatment for conditions other than malignant neoplasm: Secondary | ICD-10-CM

## 2012-12-07 DIAGNOSIS — I359 Nonrheumatic aortic valve disorder, unspecified: Secondary | ICD-10-CM

## 2012-12-07 DIAGNOSIS — I712 Thoracic aortic aneurysm, without rupture: Secondary | ICD-10-CM

## 2012-12-08 ENCOUNTER — Encounter: Payer: Self-pay | Admitting: Surgery

## 2012-12-08 NOTE — Progress Notes (Signed)
      [address]       [place] [address]             [phone]        HPI:  Patient returns for postoperative follow-up having undergone Bentall procedure using a composite pericardial valve/conduit and replacement of the ascending aorta and proximal aortic arch on 09/29/2012. When I last saw her on 11/16/2012 she was still having a lot of difficulty swallowing that she thought was due to Candida esophagitis. I started her on Diflucan 200 mg per day for the past three weeks. She says that her symptoms are much improved but not completely gone. Her LFT's were checked on 11/25/2012 and were normal.   Current Outpatient Prescriptions  Medication Sig Dispense Refill  . aspirin EC 81 MG EC tablet Take 1 tablet (81 mg total) by mouth daily.      . benzonatate (TESSALON) 100 MG capsule Take 1-2 capsules (100-200 mg total) by mouth 2 (two) times daily as needed for cough.  100 capsule  0  . esomeprazole (NEXIUM) 40 MG capsule Take 40 mg by mouth daily as needed. Acid reflux      . lidocaine (LIDODERM) 5 % Place 3 patches onto the skin daily. Remove & Discard patch within 12 hours or as directed by MD  90 patch  3  . oxyCODONE (OXYCONTIN) 10 MG 12 hr tablet Take 1 tablet (10 mg total) by mouth daily as needed for pain. Please fill on or after 01/17/13  60 tablet  0  . Probiotic Product (PROBIOTIC DAILY PO) Take by mouth daily.      Marland Kitchen rOPINIRole (REQUIP) 2 MG tablet TAKE ONE TABLET BY MOUTH THREE TIMES DAILY  90 tablet  0  . triamcinolone ointment (KENALOG) 0.5 % Apply topically 2 (two) times daily.  30 g  0  . triazolam (HALCION) 0.125 MG tablet Take 1 tablet (0.125 mg total) by mouth at bedtime as needed.  30 tablet  2   No current facility-administered medications for this visit.     Physical Exam: BP 116/68  Pulse 89  Resp 16  Ht 5' 1.25" (1.556 m)  Wt 149 lb 8 oz (67.813 kg)  BMI 28.01 kg/m2  SpO2 99% She looks much better. She is actually smiling today. Lung exam  is clear  Cardiac exam shows a regular rate and rhythm with norma heart valve sounds.  The incision is healing well  There is no peripheral edema     Impression:  She is doing well overall following her cardiac surgery. She still seems to have some symptoms of candida esophagitis and probably should be treated for a longer period of time. I usually treat for at least 2 weeks after symptoms resolve. She is immunocompromised due to her use of Simponi Aria for her arthritis.  She certainly could have something else going on with her esophagus but has gotten much better on Diflucan. She looked terrible a couple weeks ago.  Plan:  She will continue Diflucan 200 mg per day for a few more weeks. She has an appointment with Dr. Juanda Chance for GI follow-up at the end of the month. I will see her back if she has any problems with her incisions. I told her that she should not restart the Simponi Aria until this esophageal problem is resolved.

## 2012-12-19 DIAGNOSIS — B370 Candidal stomatitis: Secondary | ICD-10-CM | POA: Diagnosis not present

## 2012-12-19 DIAGNOSIS — Z09 Encounter for follow-up examination after completed treatment for conditions other than malignant neoplasm: Secondary | ICD-10-CM | POA: Diagnosis not present

## 2012-12-19 DIAGNOSIS — IMO0001 Reserved for inherently not codable concepts without codable children: Secondary | ICD-10-CM | POA: Diagnosis not present

## 2012-12-19 DIAGNOSIS — M069 Rheumatoid arthritis, unspecified: Secondary | ICD-10-CM | POA: Diagnosis not present

## 2012-12-22 DIAGNOSIS — H5052 Exophoria: Secondary | ICD-10-CM | POA: Diagnosis not present

## 2012-12-27 DIAGNOSIS — Z111 Encounter for screening for respiratory tuberculosis: Secondary | ICD-10-CM | POA: Diagnosis not present

## 2012-12-29 ENCOUNTER — Encounter (HOSPITAL_COMMUNITY)
Admission: RE | Admit: 2012-12-29 | Discharge: 2012-12-29 | Disposition: A | Discharge status: Home / Self Care | Payer: Medicare Other | Source: Ambulatory Visit | Attending: Cardiology | Admitting: Cardiology

## 2012-12-29 NOTE — Progress Notes (Signed)
Cardiac Rehab Medication Review by a Pharmacist  Does the patient  feel that his/her medications are working for him/her?  yes  Has the patient been experiencing any side effects to the medications prescribed?  no  Does the patient measure his/her own blood pressure or blood glucose at home?  No - "MD appointments at least every 2 months and they check there"  Does the patient have any problems obtaining medications due to transportation or finances?   Yes - when she gets in the donut hole, especially the Lidoderm patches are difficult to afford  Understanding of regimen: good Understanding of indications: good Potential of compliance: good    Pharmacist comments: Patient describes good understanding of medications and compliance.  Ninetta Lights 12/29/2012 8:58 AM

## 2012-12-30 ENCOUNTER — Encounter: Payer: Self-pay | Admitting: Internal Medicine

## 2012-12-30 ENCOUNTER — Ambulatory Visit (INDEPENDENT_AMBULATORY_CARE_PROVIDER_SITE_OTHER): Payer: Medicare Other | Admitting: Internal Medicine

## 2012-12-30 VITALS — BP 94/62 | HR 68 | Ht 62.25 in | Wt 147.2 lb

## 2012-12-30 DIAGNOSIS — B3781 Candidal esophagitis: Secondary | ICD-10-CM

## 2012-12-30 NOTE — Patient Instructions (Addendum)
Dr Laneta Simmers, Dr Posey Rea

## 2012-12-30 NOTE — Progress Notes (Signed)
Diane Abbott 1935-02-25 MRN [id number]   History of Present Illness:  This is a 78 year old white female with recent hospitalization for Bentall procedure to repair aortic valve, descending aorta and aortic arch on 09/29/2012 by Dr. Laneta Simmers. She had a difficulty post operatively with swallowing and was treated with a prolonged course of Diflucan 200 mg for 21 days . Her swallowing has improved and she no longer has odynophagia or dysphagia. She has rheumatoid arteritis and has been on Simponi, which is a biological treatment. This was discontinued 4 weeks prior to her surgery and has not been resumed yet. Her last upper endoscopy in November 2012 for dysphagia showed no evidence of thrush. She was treated empirically with Diflucan. A barium esophagram at that time showed normal peristalsis, hypertrophy cricopharyngeus muscle and a small sliding hiatal hernia. There was no reflux. A 12.5 mm tablet passed without delay. She is currently asymptomatic. She is up-to-date on her colonoscopy. Her last exam was in 2011.    Past Medical History  Diagnosis Date  . Fibromyalgia   . Pulmonary embolism   . RA (rheumatoid arthritis)   . Vitamin B 12 deficiency   . Vitamin D deficiency   . Osteoarthritis   . Osteopenia   . Adrenal insufficiency   . IBS (irritable bowel syndrome)   . History of blood clots 2008    below knee  . Diabetes mellitus type II     pt denies this 03-05-11  . Esophagitis   . Depression   . GERD (gastroesophageal reflux disease)   . Connective tissue disorder   . Cataracts, bilateral     more in left than right  . Osteoarthritis of left shoulder 12/07/2011  . Heart murmur   . Leaky heart valve   . Anginal pain     "comes and goes has occured since 2012"  . Bronchitis Jan 2013-March 2013    severe  . Shortness of breath     comes from aneurysm  . Vertigo     hx of  . Urinary leakage     when coughing  . Sliding hiatal hernia   . Fatty liver    Past Surgical History   Procedure Laterality Date  . Abdominal hysterectomy    . Total knee arthroplasty      bilateral  . Cholecystectomy  2003  . Tonsillectomy  1941  . Appendectomy  1995  . Wrist surgery      bilateral  . Bilateral oophorectomy    . Tummy tuck    . Cosmetic surgery      Tummy tuck  . Total shoulder arthroplasty  12/07/2011    Procedure: TOTAL SHOULDER ARTHROPLASTY;  Surgeon: Eulas Post, MD;  Location: [facility] OR;  Service: Orthopedics;  Laterality: Left;  left total shoulder artthroplasty  . Joint replacement Bilateral   . Cardiac catheterization  09/15/12    no PCI  . Colonoscopy    . Breast biopsy Left   . Skin cancer excision Right     cheek  . Eye surgery Bilateral     Cataract  . Bentall procedure N/A 09/29/2012    Procedure: BENTALL PROCEDURE;  Surgeon: [name], MD;  Location: [facility] OR;  Service: Open Heart Surgery;  Laterality: N/A;  WITH CIRC ARREST  . Intraoperative transesophageal echocardiogram N/A 09/29/2012    Procedure: INTRAOPERATIVE TRANSESOPHAGEAL ECHOCARDIOGRAM;  Surgeon: [name], MD;  Location: [facility] OR;  Service: Open Heart Surgery;  Laterality: N/A;    reports that  she has never smoked. She has never used smokeless tobacco. She reports that  drinks alcohol. She reports that she does not use illicit drugs. family history includes Breast cancer in an other family member; COPD in her father; Clotting disorder in an other family member; Diabetes in her mother; Heart disease in her maternal grandmother; Ovarian cancer in her mother; Pancreatic cancer in an other family member. There is no history of Colon cancer. Allergies  Allergen Reactions  . Codeine Other (See Comments)    Blood pressure drops; "I pass out."  . Pramipexole Dihydrochloride Nausea Only    sick, falling  . Rofecoxib Other (See Comments)    "sleep walks"  . Arava [Leflunomide] Swelling  . Clonazepam   . Diclofenac Sodium   . Venlafaxine   . Alprazolam Other (See Comments)    "Makes me  mean"  . Benzodiazepines Other (See Comments)    "Halllucinations?"  . Darvocet [Propoxyphene-Acetaminophen] Other (See Comments)    "makes my eyes dilate. Pt stated I can't see"  . Duloxetine Other (See Comments)    achy legs  . Gabapentin Other (See Comments)    headache  . Latex Itching and Dermatitis    When examined with latex gloves, burns and itches in contact areas per pt.  . Morphine And Related Other (See Comments)    Hallucinations   . Nortriptyline Hcl Other (See Comments)    Migraines   . Penicillins Rash    Can take Cephalosporins  . Prednisone Swelling    Just doesn't want to take it.        Review of Systems:Occasional dysphagia to liquids area denies odynophagia chest pain nausea vomiting  The remainder of the 10 point ROS is negative except as outlined in H&P   Physical Exam: General appearance  Well developed, in no distress. Eyes- non icteric. HEENT nontraumatic, normocephalic. Mouth no lesions, tongue papillated, no cheilosis. Neck supple without adenopathy, thyroid not enlarged, no carotid bruits, no JVD. Lungs Clear to auscultation bilaterally. Cor normal S1, normal S2, regular rhythm, no murmur,  quiet precordium. Abdomen: Soft abdomen. Status post abdominoplasty. Hypoactive bowel sounds. Increased tympany. No tenderness.  Rectal:Not done  Extremities no pedal edema. Skin no lesions. Neurological alert and oriented x 3. Psychological normal mood and affect.  Assessment and Plan:  Problem #29 78 year old white female with  Candida esophagitis. She is now asymptomatic after a 21 day course of Diflucan. Due to her immune system suppression as a result of Simponi, she is a high risk to develop recurrent candida esophagitis. We may have to do an upper endoscopy to make a definite diagnosis. She wants to make sure today that if she does have a recurrence of Candida that we will be able to schedule her  endoscopy directly rather than waiting several  weeks for an appointment.   Problem #2 Patient's last colonoscopy was in 2011. Her next colonoscopy will be due in 10 years. However, she may not need one at that time because of age.   12/30/2012 [name]

## 2013-01-04 ENCOUNTER — Encounter (HOSPITAL_COMMUNITY)
Admission: RE | Admit: 2013-01-04 | Discharge: 2013-01-04 | Disposition: A | Discharge status: Home / Self Care | Payer: Medicare Other | Source: Ambulatory Visit | Attending: Cardiology | Admitting: Cardiology

## 2013-01-04 DIAGNOSIS — I319 Disease of pericardium, unspecified: Secondary | ICD-10-CM | POA: Diagnosis not present

## 2013-01-04 DIAGNOSIS — J449 Chronic obstructive pulmonary disease, unspecified: Secondary | ICD-10-CM | POA: Insufficient documentation

## 2013-01-04 DIAGNOSIS — Z954 Presence of other heart-valve replacement: Secondary | ICD-10-CM | POA: Insufficient documentation

## 2013-01-04 DIAGNOSIS — Z86711 Personal history of pulmonary embolism: Secondary | ICD-10-CM | POA: Diagnosis not present

## 2013-01-04 DIAGNOSIS — I359 Nonrheumatic aortic valve disorder, unspecified: Secondary | ICD-10-CM | POA: Insufficient documentation

## 2013-01-04 DIAGNOSIS — I712 Thoracic aortic aneurysm, without rupture, unspecified: Secondary | ICD-10-CM | POA: Diagnosis not present

## 2013-01-04 DIAGNOSIS — Z5189 Encounter for other specified aftercare: Secondary | ICD-10-CM | POA: Insufficient documentation

## 2013-01-04 DIAGNOSIS — J4489 Other specified chronic obstructive pulmonary disease: Secondary | ICD-10-CM | POA: Insufficient documentation

## 2013-01-05 NOTE — Progress Notes (Signed)
Patient began cardiac rehab 01-04-13. Patient was oriented to program routine, exercise equipment safety measures and hand hygiene. Patient tolerated exercise well. VSS entry ECG tracing Normal Sinus Rhythm with HR=87. PHQ score = 0. Patient reports overall positive about her future and exhibits positive coping skills.  Patient is able to participate in the rehab process. Her daughter and son is her support system. Medication list reviewed by Karlene Lineman RN.

## 2013-01-06 ENCOUNTER — Encounter (HOSPITAL_COMMUNITY)
Admission: RE | Admit: 2013-01-06 | Discharge: 2013-01-06 | Disposition: A | Discharge status: Home / Self Care | Payer: Medicare Other | Source: Ambulatory Visit | Attending: Cardiology | Admitting: Cardiology

## 2013-01-06 DIAGNOSIS — J449 Chronic obstructive pulmonary disease, unspecified: Secondary | ICD-10-CM | POA: Diagnosis not present

## 2013-01-06 DIAGNOSIS — I359 Nonrheumatic aortic valve disorder, unspecified: Secondary | ICD-10-CM | POA: Diagnosis not present

## 2013-01-06 DIAGNOSIS — Z954 Presence of other heart-valve replacement: Secondary | ICD-10-CM | POA: Diagnosis not present

## 2013-01-06 DIAGNOSIS — I712 Thoracic aortic aneurysm, without rupture, unspecified: Secondary | ICD-10-CM | POA: Diagnosis not present

## 2013-01-06 DIAGNOSIS — I319 Disease of pericardium, unspecified: Secondary | ICD-10-CM | POA: Diagnosis not present

## 2013-01-06 DIAGNOSIS — Z5189 Encounter for other specified aftercare: Secondary | ICD-10-CM | POA: Diagnosis not present

## 2013-01-09 ENCOUNTER — Encounter (HOSPITAL_COMMUNITY)
Admission: RE | Admit: 2013-01-09 | Discharge: 2013-01-09 | Disposition: A | Discharge status: Home / Self Care | Payer: Medicare Other | Source: Ambulatory Visit | Attending: Cardiology | Admitting: Cardiology

## 2013-01-09 DIAGNOSIS — Z954 Presence of other heart-valve replacement: Secondary | ICD-10-CM | POA: Diagnosis not present

## 2013-01-09 DIAGNOSIS — J449 Chronic obstructive pulmonary disease, unspecified: Secondary | ICD-10-CM | POA: Diagnosis not present

## 2013-01-09 DIAGNOSIS — M069 Rheumatoid arthritis, unspecified: Secondary | ICD-10-CM | POA: Diagnosis not present

## 2013-01-09 DIAGNOSIS — Z79899 Other long term (current) drug therapy: Secondary | ICD-10-CM | POA: Diagnosis not present

## 2013-01-09 DIAGNOSIS — I712 Thoracic aortic aneurysm, without rupture, unspecified: Secondary | ICD-10-CM | POA: Diagnosis not present

## 2013-01-09 DIAGNOSIS — I359 Nonrheumatic aortic valve disorder, unspecified: Secondary | ICD-10-CM | POA: Diagnosis not present

## 2013-01-09 DIAGNOSIS — Z5189 Encounter for other specified aftercare: Secondary | ICD-10-CM | POA: Diagnosis not present

## 2013-01-09 DIAGNOSIS — I319 Disease of pericardium, unspecified: Secondary | ICD-10-CM | POA: Diagnosis not present

## 2013-01-11 ENCOUNTER — Encounter (HOSPITAL_COMMUNITY)
Admission: RE | Admit: 2013-01-11 | Discharge: 2013-01-11 | Disposition: A | Discharge status: Home / Self Care | Payer: Medicare Other | Source: Ambulatory Visit | Attending: Cardiology | Admitting: Cardiology

## 2013-01-11 DIAGNOSIS — I319 Disease of pericardium, unspecified: Secondary | ICD-10-CM | POA: Diagnosis not present

## 2013-01-11 DIAGNOSIS — I712 Thoracic aortic aneurysm, without rupture, unspecified: Secondary | ICD-10-CM | POA: Diagnosis not present

## 2013-01-11 DIAGNOSIS — I359 Nonrheumatic aortic valve disorder, unspecified: Secondary | ICD-10-CM | POA: Diagnosis not present

## 2013-01-11 DIAGNOSIS — Z954 Presence of other heart-valve replacement: Secondary | ICD-10-CM | POA: Diagnosis not present

## 2013-01-11 DIAGNOSIS — J449 Chronic obstructive pulmonary disease, unspecified: Secondary | ICD-10-CM | POA: Diagnosis not present

## 2013-01-11 DIAGNOSIS — Z5189 Encounter for other specified aftercare: Secondary | ICD-10-CM | POA: Diagnosis not present

## 2013-01-11 NOTE — Progress Notes (Signed)
Reviewed home exercise with pt today.  Pt plans to walk for 30 minutes on days outside of CR for exercise, with an additional day of hand weights.  Reviewed THR, pulse, RPE, sign and symptoms, and when to call 911 or MD.  Pt voiced understanding.  Alexia Freestone, MS, ACSM RCEP 01/11/2013 9:45 AM

## 2013-01-13 ENCOUNTER — Encounter (HOSPITAL_COMMUNITY)
Admission: RE | Admit: 2013-01-13 | Discharge: 2013-01-13 | Disposition: A | Discharge status: Home / Self Care | Payer: Medicare Other | Source: Ambulatory Visit | Attending: Cardiology | Admitting: Cardiology

## 2013-01-13 DIAGNOSIS — Z954 Presence of other heart-valve replacement: Secondary | ICD-10-CM | POA: Diagnosis not present

## 2013-01-13 DIAGNOSIS — I712 Thoracic aortic aneurysm, without rupture, unspecified: Secondary | ICD-10-CM | POA: Diagnosis not present

## 2013-01-13 DIAGNOSIS — I319 Disease of pericardium, unspecified: Secondary | ICD-10-CM | POA: Diagnosis not present

## 2013-01-13 DIAGNOSIS — I359 Nonrheumatic aortic valve disorder, unspecified: Secondary | ICD-10-CM | POA: Diagnosis not present

## 2013-01-13 DIAGNOSIS — Z5189 Encounter for other specified aftercare: Secondary | ICD-10-CM | POA: Diagnosis not present

## 2013-01-13 DIAGNOSIS — J449 Chronic obstructive pulmonary disease, unspecified: Secondary | ICD-10-CM | POA: Diagnosis not present

## 2013-01-16 ENCOUNTER — Encounter (HOSPITAL_COMMUNITY)
Admission: RE | Admit: 2013-01-16 | Discharge: 2013-01-16 | Disposition: A | Discharge status: Home / Self Care | Payer: Medicare Other | Source: Ambulatory Visit | Attending: Cardiology | Admitting: Cardiology

## 2013-01-16 DIAGNOSIS — I712 Thoracic aortic aneurysm, without rupture, unspecified: Secondary | ICD-10-CM | POA: Diagnosis not present

## 2013-01-16 DIAGNOSIS — Z954 Presence of other heart-valve replacement: Secondary | ICD-10-CM | POA: Diagnosis not present

## 2013-01-16 DIAGNOSIS — I319 Disease of pericardium, unspecified: Secondary | ICD-10-CM | POA: Diagnosis not present

## 2013-01-16 DIAGNOSIS — I359 Nonrheumatic aortic valve disorder, unspecified: Secondary | ICD-10-CM | POA: Diagnosis not present

## 2013-01-16 DIAGNOSIS — Z5189 Encounter for other specified aftercare: Secondary | ICD-10-CM | POA: Diagnosis not present

## 2013-01-16 DIAGNOSIS — J449 Chronic obstructive pulmonary disease, unspecified: Secondary | ICD-10-CM | POA: Diagnosis not present

## 2013-01-16 NOTE — Progress Notes (Signed)
PSYCHOSOCIAL ASSESSMENT  Pt psychosocial assessment reveals no barriers to rehab participation.  Pt quality of life is ideal with good outlook.  However pt reports that she has had difficulty times in the past primarily r/t divorce and death of a child.  Pt admits you never get over this type of grief, however she has learned to cope.  Pt also reports that her youngest daughter has health concerns and is currently living with her for care.    Pt exhibits positive coping skills and has supportive children.  Offered emotional support and reassurance.  Will continue to monitor.

## 2013-01-18 ENCOUNTER — Encounter (HOSPITAL_COMMUNITY)
Admission: RE | Admit: 2013-01-18 | Discharge: 2013-01-18 | Disposition: A | Discharge status: Home / Self Care | Payer: Medicare Other | Source: Ambulatory Visit | Attending: Cardiology | Admitting: Cardiology

## 2013-01-18 DIAGNOSIS — I712 Thoracic aortic aneurysm, without rupture, unspecified: Secondary | ICD-10-CM | POA: Diagnosis not present

## 2013-01-18 DIAGNOSIS — Z5189 Encounter for other specified aftercare: Secondary | ICD-10-CM | POA: Diagnosis not present

## 2013-01-18 DIAGNOSIS — J449 Chronic obstructive pulmonary disease, unspecified: Secondary | ICD-10-CM | POA: Diagnosis not present

## 2013-01-18 DIAGNOSIS — I319 Disease of pericardium, unspecified: Secondary | ICD-10-CM | POA: Diagnosis not present

## 2013-01-18 DIAGNOSIS — Z954 Presence of other heart-valve replacement: Secondary | ICD-10-CM | POA: Diagnosis not present

## 2013-01-18 DIAGNOSIS — I359 Nonrheumatic aortic valve disorder, unspecified: Secondary | ICD-10-CM | POA: Diagnosis not present

## 2013-01-20 ENCOUNTER — Encounter (HOSPITAL_COMMUNITY)
Admission: RE | Admit: 2013-01-20 | Discharge: 2013-01-20 | Disposition: A | Discharge status: Home / Self Care | Payer: Medicare Other | Source: Ambulatory Visit | Attending: Cardiology | Admitting: Cardiology

## 2013-01-20 DIAGNOSIS — J449 Chronic obstructive pulmonary disease, unspecified: Secondary | ICD-10-CM | POA: Diagnosis not present

## 2013-01-20 DIAGNOSIS — I712 Thoracic aortic aneurysm, without rupture, unspecified: Secondary | ICD-10-CM | POA: Diagnosis not present

## 2013-01-20 DIAGNOSIS — Z5189 Encounter for other specified aftercare: Secondary | ICD-10-CM | POA: Diagnosis not present

## 2013-01-20 DIAGNOSIS — I319 Disease of pericardium, unspecified: Secondary | ICD-10-CM | POA: Diagnosis not present

## 2013-01-20 DIAGNOSIS — I359 Nonrheumatic aortic valve disorder, unspecified: Secondary | ICD-10-CM | POA: Diagnosis not present

## 2013-01-20 DIAGNOSIS — Z954 Presence of other heart-valve replacement: Secondary | ICD-10-CM | POA: Diagnosis not present

## 2013-01-23 ENCOUNTER — Telehealth (HOSPITAL_COMMUNITY): Payer: Self-pay | Admitting: Internal Medicine

## 2013-01-23 ENCOUNTER — Encounter (HOSPITAL_COMMUNITY): Payer: Medicare Other

## 2013-01-24 ENCOUNTER — Encounter: Payer: Self-pay | Admitting: Internal Medicine

## 2013-01-24 ENCOUNTER — Ambulatory Visit (INDEPENDENT_AMBULATORY_CARE_PROVIDER_SITE_OTHER): Payer: Medicare Other | Admitting: Internal Medicine

## 2013-01-24 VITALS — BP 140/84 | HR 92 | Temp 97.1°F | Resp 16 | Wt 148.0 lb

## 2013-01-24 DIAGNOSIS — E538 Deficiency of other specified B group vitamins: Secondary | ICD-10-CM

## 2013-01-24 DIAGNOSIS — R609 Edema, unspecified: Secondary | ICD-10-CM | POA: Diagnosis not present

## 2013-01-24 DIAGNOSIS — F329 Major depressive disorder, single episode, unspecified: Secondary | ICD-10-CM

## 2013-01-24 DIAGNOSIS — F3289 Other specified depressive episodes: Secondary | ICD-10-CM | POA: Diagnosis not present

## 2013-01-24 DIAGNOSIS — IMO0001 Reserved for inherently not codable concepts without codable children: Secondary | ICD-10-CM

## 2013-01-24 DIAGNOSIS — J449 Chronic obstructive pulmonary disease, unspecified: Secondary | ICD-10-CM

## 2013-01-24 DIAGNOSIS — I712 Thoracic aortic aneurysm, without rupture: Secondary | ICD-10-CM

## 2013-01-24 MED ORDER — OXYCODONE HCL 10 MG PO TB12: 10.0000 mg | ORAL_TABLET | Freq: Two times a day (BID) | ORAL | Status: DC

## 2013-01-24 NOTE — Assessment & Plan Note (Signed)
Doing good  

## 2013-01-24 NOTE — Assessment & Plan Note (Signed)
Continue with current prescription therapy as reflected on the Med list.  

## 2013-01-24 NOTE — Progress Notes (Signed)
Subjective:      HPI  F/u cough w/clear slime w/eating and thrush - resolved  F/u R CTS  and neck pain - Dr Jeral Fruit is planning to do surgeries: she had a myelogram   S/p thor AA and AR recently repaired. AVR is repaired Dr Laneta Simmers - she went home last Wed  S/p L shoulder replacement 12/07/11 by Dr Dion Saucier. F/u pain in L arm - better   The patient is here to follow up on chronic depression, anxiety, headaches and chronic moderate fibromyalgia symptoms controlled with medicines, stable. Xarelto - we switched her to it due to Lovenox cost a few months ago. Xarelto is $400/mo.Marland KitchenMarland KitchenShe is on Sympony for RA q 2 mo  Wt Readings from Last 3 Encounters:  01/24/13 148 lb (67.132 kg)  12/30/12 147 lb 3.2 oz (66.769 kg)  12/29/12 148 lb 2.4 oz (67.2 kg)   BP Readings from Last 3 Encounters:  01/24/13 140/84  12/30/12 94/62  12/29/12 118/76   Past Medical History  Diagnosis Date  . Fibromyalgia   . Pulmonary embolism   . RA (rheumatoid arthritis)   . Vitamin B 12 deficiency   . Vitamin D deficiency   . Osteoarthritis   . Osteopenia   . Adrenal insufficiency   . IBS (irritable bowel syndrome)   . History of blood clots 2008    below knee  . Diabetes mellitus type II     pt denies this 03-05-11  . Esophagitis   . Depression   . GERD (gastroesophageal reflux disease)   . Connective tissue disorder   . Cataracts, bilateral     more in left than right  . Osteoarthritis of left shoulder 12/07/2011  . Heart murmur   . Leaky heart valve   . Anginal pain     "comes and goes has occured since 2012"  . Bronchitis Jan 2013-March 2013    severe  . Shortness of breath     comes from aneurysm  . Vertigo     hx of  . Urinary leakage     when coughing  . Sliding hiatal hernia   . Fatty liver    Past Surgical History  Procedure Laterality Date  . Abdominal hysterectomy    . Total knee arthroplasty      bilateral  . Cholecystectomy  2003  . Tonsillectomy  1941  . Appendectomy  1995   . Wrist surgery      bilateral  . Bilateral oophorectomy    . Tummy tuck    . Cosmetic surgery      Tummy tuck  . Total shoulder arthroplasty  12/07/2011    Procedure: TOTAL SHOULDER ARTHROPLASTY;  Surgeon: Eulas Post, MD;  Location: [facility] OR;  Service: Orthopedics;  Laterality: Left;  left total shoulder artthroplasty  . Joint replacement Bilateral   . Cardiac catheterization  09/15/12    no PCI  . Colonoscopy    . Breast biopsy Left   . Skin cancer excision Right     cheek  . Eye surgery Bilateral     Cataract  . Bentall procedure N/A 09/29/2012    Procedure: BENTALL PROCEDURE;  Surgeon: [name], MD;  Location: [facility] OR;  Service: Open Heart Surgery;  Laterality: N/A;  WITH CIRC ARREST  . Intraoperative transesophageal echocardiogram N/A 09/29/2012    Procedure: INTRAOPERATIVE TRANSESOPHAGEAL ECHOCARDIOGRAM;  Surgeon: [name], MD;  Location: [facility] OR;  Service: Open Heart Surgery;  Laterality: N/A;    reports that  she has never smoked. She has never used smokeless tobacco. She reports that  drinks alcohol. She reports that she does not use illicit drugs. family history includes Breast cancer in an other family member; COPD in her father; Clotting disorder in an other family member; Diabetes in her mother; Heart disease in her maternal grandmother; Ovarian cancer in her mother; Pancreatic cancer in an other family member. There is no history of Colon cancer. Allergies  Allergen Reactions  . Codeine Other (See Comments)    Blood pressure drops; "I pass out."  . Pramipexole Dihydrochloride Nausea Only    sick, falling  . Rofecoxib Other (See Comments)    "sleep walks"  . Arava [Leflunomide] Swelling  . Clonazepam   . Diclofenac Sodium   . Venlafaxine   . Alprazolam Other (See Comments)    "Makes me mean"  . Benzodiazepines Other (See Comments)    "Halllucinations?"  . Darvocet [Propoxyphene-Acetaminophen] Other (See Comments)    "makes my eyes dilate. Pt stated I can't  see"  . Duloxetine Other (See Comments)    achy legs  . Gabapentin Other (See Comments)    headache  . Latex Itching and Dermatitis    When examined with latex gloves, burns and itches in contact areas per pt.  . Morphine And Related Other (See Comments)    Hallucinations   . Nortriptyline Hcl Other (See Comments)    Migraines   . Penicillins Rash    Can take Cephalosporins  . Prednisone Swelling    Just doesn't want to take it.   Current Outpatient Prescriptions on File Prior to Visit  Medication Sig Dispense Refill  . aspirin EC 81 MG EC tablet Take 1 tablet (81 mg total) by mouth daily.      Marland Kitchen esomeprazole (NEXIUM) 40 MG capsule Take 40 mg by mouth daily as needed. Acid reflux      . lidocaine (LIDODERM) 5 % Place 3 patches onto the skin daily as needed. Remove & Discard patch within 12 hours or as directed by MD      . oxyCODONE (OXYCONTIN) 10 MG 12 hr tablet Take 10 mg by mouth 2 (two) times daily. Please fill on or after 01/17/13      . Probiotic Product (PROBIOTIC DAILY PO) Take by mouth daily.      Marland Kitchen rOPINIRole (REQUIP) 2 MG tablet TAKE ONE TABLET BY MOUTH THREE TIMES DAILY  90 tablet  0   No current facility-administered medications on file prior to visit.      Review of Systems  Constitutional: Negative for activity change, appetite change, fatigue and unexpected weight change.  HENT: Positive for sinus pressure. Negative for congestion and mouth sores.   Eyes: Negative for visual disturbance.  Respiratory: Negative for chest tightness.   Gastrointestinal: Negative for nausea and abdominal pain.  Genitourinary: Negative for frequency, difficulty urinating and vaginal pain.  Musculoskeletal: Positive for back pain and arthralgias. Negative for gait problem.  Skin: Negative for pallor.  Neurological: Negative for dizziness, tremors, weakness and numbness.  Psychiatric/Behavioral: Negative for confusion and sleep disturbance.   BP 140/84  Pulse 92  Temp(Src) 97.1  F (36.2 C) (Oral)  Resp 16  Wt 148 lb (67.132 kg)  BMI 26.86 kg/m2     Objective:   Physical Exam  Constitutional: She appears well-developed and well-nourished. No distress.  HENT:  Head: Normocephalic.  Right Ear: External ear normal.  Left Ear: External ear normal.  Nose: Nose normal.  Mouth/Throat: Oropharynx is clear  and moist.  Eyes: Conjunctivae are normal. Pupils are equal, round, and reactive to light. Right eye exhibits no discharge. Left eye exhibits no discharge.  Neck: Normal range of motion. Neck supple. No JVD present. No tracheal deviation present. No thyromegaly present.  Cardiovascular: Normal rate, regular rhythm and normal heart sounds.   Pulmonary/Chest: No stridor. No respiratory distress. She has no wheezes.  Abdominal: Soft. Bowel sounds are normal. She exhibits no distension and no mass. There is no tenderness. There is no rebound and no guarding.  Musculoskeletal: She exhibits tenderness. She exhibits no edema.  L shoulder is not tender w/ROM Neck is tender w/ROM  Lymphadenopathy:    She has no cervical adenopathy.  Neurological: She displays normal reflexes. No cranial nerve deficit. She exhibits normal muscle tone. Coordination normal.  Skin: No rash noted. No erythema.  Psychiatric: She has a normal mood and affect. Her behavior is normal. Judgment and thought content normal.  sad    Lab Results  Component Value Date   WBC 4.6 11/25/2012   HGB 11.4* 11/25/2012   HCT 34.7* 11/25/2012   PLT 184.0 11/25/2012   GLUCOSE 100* 11/25/2012   ALT 11 11/25/2012   AST 18 11/25/2012   NA 139 11/25/2012   K 4.5 11/25/2012   CL 103 11/25/2012   CREATININE 1.3* 11/25/2012   BUN 19 11/25/2012   CO2 29 11/25/2012   TSH 2.88 01/09/2011   INR 1.34 09/29/2012   HGBA1C 5.7* 09/27/2012           Assessment & Plan:

## 2013-01-24 NOTE — Assessment & Plan Note (Signed)
Recovering well 

## 2013-01-24 NOTE — Assessment & Plan Note (Signed)
Resolved

## 2013-01-25 ENCOUNTER — Encounter (HOSPITAL_COMMUNITY)
Admission: RE | Admit: 2013-01-25 | Discharge: 2013-01-25 | Disposition: A | Discharge status: Home / Self Care | Payer: Medicare Other | Source: Ambulatory Visit | Attending: Cardiology | Admitting: Cardiology

## 2013-01-25 DIAGNOSIS — I359 Nonrheumatic aortic valve disorder, unspecified: Secondary | ICD-10-CM | POA: Diagnosis not present

## 2013-01-25 DIAGNOSIS — I712 Thoracic aortic aneurysm, without rupture, unspecified: Secondary | ICD-10-CM | POA: Diagnosis not present

## 2013-01-25 DIAGNOSIS — I319 Disease of pericardium, unspecified: Secondary | ICD-10-CM | POA: Diagnosis not present

## 2013-01-25 DIAGNOSIS — Z954 Presence of other heart-valve replacement: Secondary | ICD-10-CM | POA: Diagnosis not present

## 2013-01-25 DIAGNOSIS — Z5189 Encounter for other specified aftercare: Secondary | ICD-10-CM | POA: Diagnosis not present

## 2013-01-25 DIAGNOSIS — J449 Chronic obstructive pulmonary disease, unspecified: Secondary | ICD-10-CM | POA: Diagnosis not present

## 2013-01-27 ENCOUNTER — Encounter (HOSPITAL_COMMUNITY): Payer: Medicare Other

## 2013-01-27 ENCOUNTER — Telehealth: Payer: Self-pay | Admitting: Cardiology

## 2013-01-27 ENCOUNTER — Encounter: Payer: Self-pay | Admitting: Physician Assistant

## 2013-01-27 ENCOUNTER — Telehealth (HOSPITAL_COMMUNITY): Payer: Self-pay | Admitting: Cardiac Rehabilitation

## 2013-01-27 ENCOUNTER — Telehealth (HOSPITAL_COMMUNITY): Payer: Self-pay | Admitting: Internal Medicine

## 2013-01-27 ENCOUNTER — Ambulatory Visit (INDEPENDENT_AMBULATORY_CARE_PROVIDER_SITE_OTHER)
Admission: RE | Admit: 2013-01-27 | Discharge: 2013-01-27 | Disposition: A | Discharge status: Home / Self Care | Payer: Medicare Other | Source: Ambulatory Visit | Attending: Physician Assistant | Admitting: Physician Assistant

## 2013-01-27 ENCOUNTER — Ambulatory Visit (INDEPENDENT_AMBULATORY_CARE_PROVIDER_SITE_OTHER): Payer: Medicare Other | Admitting: Physician Assistant

## 2013-01-27 VITALS — BP 129/60 | HR 73 | Ht 61.0 in | Wt 146.0 lb

## 2013-01-27 DIAGNOSIS — Z9889 Other specified postprocedural states: Secondary | ICD-10-CM

## 2013-01-27 DIAGNOSIS — Z86711 Personal history of pulmonary embolism: Secondary | ICD-10-CM

## 2013-01-27 DIAGNOSIS — R079 Chest pain, unspecified: Secondary | ICD-10-CM | POA: Diagnosis not present

## 2013-01-27 DIAGNOSIS — I712 Thoracic aortic aneurysm, without rupture, unspecified: Secondary | ICD-10-CM | POA: Diagnosis not present

## 2013-01-27 DIAGNOSIS — I2699 Other pulmonary embolism without acute cor pulmonale: Secondary | ICD-10-CM | POA: Diagnosis not present

## 2013-01-27 DIAGNOSIS — Z952 Presence of prosthetic heart valve: Secondary | ICD-10-CM

## 2013-01-27 DIAGNOSIS — Z954 Presence of other heart-valve replacement: Secondary | ICD-10-CM

## 2013-01-27 DIAGNOSIS — Z8679 Personal history of other diseases of the circulatory system: Secondary | ICD-10-CM

## 2013-01-27 LAB — BASIC METABOLIC PANEL
BUN: 21 mg/dL (ref 6–23)
Calcium: 9.1 mg/dL (ref 8.4–10.5)
GFR: 52.33 mL/min — ABNORMAL LOW (ref >60.00)
Potassium: 4.3 mEq/L (ref 3.5–5.1)
Sodium: 138 mEq/L (ref 135–145)

## 2013-01-27 LAB — HEPATIC FUNCTION PANEL
AST: 27 U/L (ref 0–37)
Alkaline Phosphatase: 65 U/L (ref 39–117)
Bilirubin, Direct: 0.1 mg/dL (ref 0.0–0.3)
Total Bilirubin: 0.6 mg/dL (ref 0.3–1.2)
Total Protein: 7.2 g/dL (ref 6.0–8.3)

## 2013-01-27 LAB — CBC WITH DIFFERENTIAL/PLATELET
Basophils Absolute: 0 10*3/uL (ref 0.0–0.1)
Basophils Relative: 0.9 % (ref 0.0–3.0)
Eosinophils Relative: 16.7 % — ABNORMAL HIGH (ref 0.0–5.0)
HCT: 33 % — ABNORMAL LOW (ref 36.0–46.0)
Hemoglobin: 10.9 g/dL — ABNORMAL LOW (ref 12.0–15.0)
Lymphocytes Relative: 41.8 % (ref 12.0–46.0)
Lymphs Abs: 1.8 10*3/uL (ref 0.7–4.0)
MCV: 87.1 fl (ref 78.0–100.0)
Monocytes Relative: 7.9 % (ref 3.0–12.0)
Neutro Abs: 1.4 10*3/uL (ref 1.4–7.7)
Platelets: 137 10*3/uL — ABNORMAL LOW (ref 150.0–400.0)
WBC: 4.3 10*3/uL — ABNORMAL LOW (ref 4.5–10.5)

## 2013-01-27 LAB — TROPONIN I: Troponin I: <0.3 ng/mL (ref <0.30)

## 2013-01-27 MED ORDER — IOHEXOL 350 MG/ML SOLN
80.0000 mL | Freq: Once | INTRAVENOUS | Status: AC | PRN
  Administered 2013-01-27: 80 mL via INTRAVENOUS

## 2013-01-27 NOTE — Addendum Note (Signed)
Addended by: Tonita Phoenix on: 01/27/2013 01:42 PM   Modules accepted: Orders

## 2013-01-27 NOTE — Telephone Encounter (Addendum)
pc to advise pt withhold from cardiac rehab until further followup with Tereso Newcomer, PAC.  Understanding verbalized

## 2013-01-27 NOTE — Addendum Note (Signed)
Addended by: Tonita Phoenix on: 01/27/2013 01:41 PM   Modules accepted: Orders

## 2013-01-27 NOTE — Telephone Encounter (Signed)
Spoke with patient who has advised me that she had very bad chest pain radiating to her neck with vomiting last night. EMS came to her house and advised that her EKG was normal and gave her the option to go to the hospital but she declined. She states that her pain level was a 10 yesterday but today her pain level is a 2. Was scheduled for cardiac rehab today and called to cancell and was advised to call us to make an appointment to be seen. She would like to be seen today. Advised add on to [facility] at 1130am today. Her daughter will drive her to the appointment. Advised her that if she felt worse to report to the ER for care.

## 2013-01-27 NOTE — Progress Notes (Addendum)
[address] [place], [place]  [address] Phone: [phone] Fax:  [phone]  Date:  01/27/2013   ID:  Diane Abbott, DOB 1935/01/29, MRN [id number]  PCP:  Sonda Primes, MD  Cardiologist:  Dr. Marca Ancona    History of Present Illness: Diane Abbott is a 78 y.o. female who is seen as a work in for chest pain.  She has a hx of OA, RA, PE/DVT, and status post Bentall bioprosthetic valved conduit and ascending aorta/proximal arch replacement. She had been having problems with C-spine arthritis. She had an MRI of her neck done, which noted ascending aortic aneurysm. Therefore, CTA chest was done. This showed a 5.8 cm ascending aortic aneurysm extending to the proximal arch. Echo showed moderate AI with trileaflet aortic valve. LHC showed no significant cardiac disease and 3+ AI.  In 5/14, she had Bentall procedure with 21 mm bioprosthetic valved conduit as well as replacement of the ascending aorta and proximal arch. Post-op echo showed EF 50%, well-seated bioprosthetic aortic valve.  Last seen by Dr. Marca Ancona 11/28/12.  Of note, she had been on Diflucan for esophageal candidiasis.  She completed a 21 day course of Diflucan with improvement in symptoms.  She has seen GI.    She has an episode of nausea and vomiting yesterday.  She then awoke last night around 12 AM with severe lower left chest pain/pressure.  She feels like the pain radiates up into her throat.  She felt dyspneic and diaphoretic.  She states the pain was a "50/10."  She put some O2 on and felt better.  She called EMS and was told her ECG was ok.  She was feeling better and decided not to go to the ED.  Pain improved today.  She denies exertional component. She actually does not notice the pain as much with activity.  She denies pain with changes in position.  She denies pleuritic CP.  Pain is not like prior PE.  No LE edema.  No syncope.    Labs (4/14): K 4.4, creatinine 1.3  Labs (7/14): K 4.5,  creatinine 1.3, LFTs normal    Wt Readings from Last 3 Encounters:  01/27/13 146 lb (66.225 kg)  01/24/13 148 lb (67.132 kg)  12/30/12 147 lb 3.2 oz (66.769 kg)     Past Medical History: 1. C-spine OA  2. Left shoulder replacement in 8/13.  3. Bilateral TKR.  4. Depression  5. Anxiety  6. Fibromyalgia  7. Rheumatoid arthritis. She is getting Sympony injections.  8. 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 Last event was in 2008. Xarelto stopped by heme/onc in 2014.  9. GERD 10. Vitamin B12 deficiency.  11. Cholecystectomy  12. Abdominal hysterectomy  13. Ascending aortic aneurysm: 4.5 cm by CT in 2009. 5.8 cm by CTA chest in 4/14. In 5/14, she had Bentall procedure with 21 mm bioprosthetic valved conduit as well as replacement of the ascending aorta and proximal arch. Post-op echo (6/14) showed EF 50%, septal dyssynergy, well-seated bioprosthetic aortic valve with mean gradient 16 mmHg.  14. Aortic insufficiency: Echo (4/14) with EF 60-65%, moderate AI, trileaflet aortic valve that incompletely coapts. LHC showed 3+ AI in 5/14. Aortic valve replaced with bioprosthetic valved conduit in 5/14.  15. LHC (5/14) with no significant coronary disease.  16. Candidal esophagitis   Current Outpatient Prescriptions  Medication Sig Dispense Refill  . aspirin EC 81 MG EC tablet Take 1 tablet (81 mg total) by mouth  daily.      . esomeprazole (NEXIUM) 40 MG capsule Take 40 mg by mouth daily as needed. Acid reflux      . lidocaine (LIDODERM) 5 % Place 3 patches onto the skin daily as needed. Remove & Discard patch within 12 hours or as directed by MD      . oxyCODONE (OXYCONTIN) 10 MG 12 hr tablet Take 1 tablet (10 mg total) by mouth 2 (two) times daily. Please fill on or after 02/23/13  60 tablet  0  . Probiotic Product (PROBIOTIC DAILY PO) Take by mouth daily.      Marland Kitchen rOPINIRole (REQUIP) 2 MG tablet TAKE ONE TABLET BY MOUTH THREE TIMES DAILY  90 tablet  0   No current  facility-administered medications for this visit.    Allergies:    Allergies  Allergen Reactions  . Codeine Other (See Comments)    Blood pressure drops; "I pass out."  . Pramipexole Dihydrochloride Nausea Only    sick, falling  . Rofecoxib Other (See Comments)    "sleep walks"  . Arava [Leflunomide] Swelling  . Clonazepam   . Diclofenac Sodium   . Venlafaxine   . Alprazolam Other (See Comments)    "Makes me mean"  . Benzodiazepines Other (See Comments)    "Halllucinations?"  . Darvocet [Propoxyphene-Acetaminophen] Other (See Comments)    "makes my eyes dilate. Pt stated I can't see"  . Duloxetine Other (See Comments)    achy legs  . Gabapentin Other (See Comments)    headache  . Latex Itching and Dermatitis    When examined with latex gloves, burns and itches in contact areas per pt.  . Morphine And Related Other (See Comments)    Hallucinations   . Nortriptyline Hcl Other (See Comments)    Migraines   . Penicillins Rash    Can take Cephalosporins  . Prednisone Swelling    Just doesn't want to take it.    Social History:  The patient  reports that she has never smoked. She has never used smokeless tobacco. She reports that  drinks alcohol. She reports that she does not use illicit drugs.   ROS:  Please see the history of present illness.   No dysphagia or odynophagia.  No cough.  She has been constipated.    All other systems reviewed and negative.   PHYSICAL EXAM: VS:  BP 129/60  Pulse 73  Ht 5\' 1"  (1.549 m)  Wt 146 lb (66.225 kg)  BMI 27.6 kg/m2  SpO2 99% Well nourished, well developed, in no acute distress HEENT: normal Neck: no JVD Cardiac:  normal S1, S2; RRR; 2/6 systolic murmur at the RUSB Lungs:  clear to auscultation bilaterally, no wheezing, rhonchi or rales Abd: soft, nontender, no hepatomegaly Ext: no edema Skin: warm and dry Neuro:  CNs 2-12 intact, no focal abnormalities noted  EKG:  NSR, HR 74, normal axis, no ST changes     ASSESSMENT  AND PLAN:  1. Chest Pain:  Etiology not clear.  Pain was quite severe.  No significant objective findings today.  She had luminal irregs at cardiac cath prior to her AVR.  Doubt ACS.  I suppose coronary vasospasm is possible.  She also has a hx of PE and is no longer on anticoagulation.  O2 sats are good and she does not have any abnormalities on ECG.  BPs in both arms are equal.  She does have a recent hx of esophageal candidiasis, but that seems to be improved.  She also has GERD and has not been taking her Nexium as scheduled.  But, she did restart this AM.  She has a hx of pancreatitis, but this does not remind her of that.    -  Check Chest CTA to assess ascending aorta and aortic arch, rule out dissection and also assess for PE.  -  Check a Troponin.  If +, I would assume she had a vasospasm, but she would need to be observed in the hospital.  -  Check a BMET, CBC, LFTs, Lipase.  -  Restart Nexium and take BID as directed.   2. Ascending Aortic Aneurysm:  Proceed with CTA as noted. 3. Aortic Insufficiency, s/p Bentall with Bioprosthetic Valved Conduit:  Echo in 6/14 with well seated valve.  Proceed with CTA as noted. 4. GERD:  Restart PPI as noted. 5. Hx of Pancreatitis:  Check Lipase as noted. 6. Hx of Pulmonary Embolism:  Proceed with CTA of the chest as noted.  7. Disposition:  F/u with me next week.   Signed, Tereso Newcomer, PA-C  01/27/2013 11:57 AM

## 2013-01-27 NOTE — Telephone Encounter (Signed)
New problem    Call EMS last night @ midnight. With pressure on chest . Pain level 2 . Wants to know should be seen early than Nov.

## 2013-01-27 NOTE — Patient Instructions (Addendum)
TAKE NEXIUM EVERYDAY  Non-Cardiac CT Angiography (CTA), is a special type of CT scan that uses a computer to produce multi-dimensional views of major blood vessels throughout the body. In CT angiography, a contrast material is injected through an IV to help visualize the blood vessels  TO BE DONE TODAY 01/27/13 @2  PM  HERE AT THE [address] OFFICE  Labs today: BMET, CBC, TROPONIN, LIPASE  FOLLOW UP IN 1 WEEK WITH SCOTT WEAVER, PA IN 1 WEEK A DAY THAT THE DR.MCLEAN IS IN THE OFFICE

## 2013-01-30 ENCOUNTER — Encounter (HOSPITAL_COMMUNITY): Payer: Medicare Other

## 2013-02-01 ENCOUNTER — Encounter: Payer: Self-pay | Admitting: Physician Assistant

## 2013-02-01 ENCOUNTER — Ambulatory Visit (INDEPENDENT_AMBULATORY_CARE_PROVIDER_SITE_OTHER): Payer: Medicare Other | Admitting: Physician Assistant

## 2013-02-01 ENCOUNTER — Encounter (HOSPITAL_COMMUNITY): Payer: Medicare Other

## 2013-02-01 ENCOUNTER — Telehealth (HOSPITAL_COMMUNITY): Payer: Self-pay | Admitting: Cardiac Rehabilitation

## 2013-02-01 VITALS — BP 128/68 | HR 71 | Ht 61.0 in | Wt 149.0 lb

## 2013-02-01 DIAGNOSIS — Z952 Presence of prosthetic heart valve: Secondary | ICD-10-CM

## 2013-02-01 DIAGNOSIS — K219 Gastro-esophageal reflux disease without esophagitis: Secondary | ICD-10-CM

## 2013-02-01 DIAGNOSIS — R079 Chest pain, unspecified: Secondary | ICD-10-CM | POA: Diagnosis not present

## 2013-02-01 DIAGNOSIS — I712 Thoracic aortic aneurysm, without rupture, unspecified: Secondary | ICD-10-CM | POA: Diagnosis not present

## 2013-02-01 DIAGNOSIS — Z954 Presence of other heart-valve replacement: Secondary | ICD-10-CM | POA: Diagnosis not present

## 2013-02-01 NOTE — Telephone Encounter (Signed)
pc to pt to inform of clearance to return to cardiac rehab 02/03/13.  Understanding verbalized

## 2013-02-01 NOTE — Patient Instructions (Addendum)
KEEP APPT WITH DR. Shirlee Latch IN 03/2013  NO CHANGES WERE MADE TODAY

## 2013-02-01 NOTE — Progress Notes (Signed)
[address] [place], [place]  [address] Phone: [phone] Fax:  [phone]  Date:  02/01/2013   ID:  Diane Abbott, DOB 1935/04/11, MRN [id number]  PCP:  Sonda Primes, MD  Cardiologist:  Dr. Marca Ancona    History of Present Illness: Diane Abbott is a 78 y.o. female who returns for f/u.   She has a hx of OA, RA, PE/DVT, and status post Bentall bioprosthetic valved conduit and ascending aorta/proximal arch replacement. She had been having problems with C-spine arthritis. She had an MRI of her neck done, which noted ascending aortic aneurysm. Therefore, CTA chest was done. This showed a 5.8 cm ascending aortic aneurysm extending to the proximal arch. Echo showed moderate AI with trileaflet aortic valve. LHC showed no significant cardiac disease and 3+ AI.  In 5/14, she had Bentall procedure with 21 mm bioprosthetic valved conduit as well as replacement of the ascending aorta and proximal arch. Post-op echo showed EF 50%, well-seated bioprosthetic aortic valve.  Last seen by Dr. Marca Ancona 11/28/12.  Of note, she had been on Diflucan for esophageal candidiasis.  She completed a 21 day course of Diflucan with improvement in symptoms.  She has seen GI.    I saw her last week after she awoke with CP the night before.  It was severe and she called EMS.  She decided not to go to the hospital.  I checked troponin and it was negative. Lipase was normal.  I had her do a Chest CTA and it demonstrated a stable ascending aorta replacement and no evidence of PE.  I asked her to restart her PPI on bid regimen (she had been off).  She is doing better.  No further CP.  No dyspnea, syncope, orthopnea, PND, edema.  She has some occ belching.      Labs (4/14): K 4.4, creatinine 1.3  Labs (7/14): K 4.5, creatinine 1.3, LFTs normal  Labs (9/14): K 4.3, Cr 1.1, Lipase 34, ALT 17, Hgb 10.9, PLT 137K  Chest CTA (01/27/13): IMPRESSION: 1. No embolism identified. 2. Aortic valve prosthesis  and ascending aneurysm repair, without complicating feature. The proximal arch, just above the level of the repair, remains dilated at 4.8 cm. Although contrast seems more optimal for the pulmonary arteries than the systemic arterial system, no dissection involving the aorta is identified. 3. Mild left and right ventricular prominence.   Wt Readings from Last 3 Encounters:  02/01/13 149 lb (67.586 kg)  01/27/13 146 lb (66.225 kg)  01/24/13 148 lb (67.132 kg)     Past Medical History: 1. C-spine OA  2. Left shoulder replacement in 8/13.  3. Bilateral TKR.  4. Depression  5. Anxiety  6. Fibromyalgia  7. Rheumatoid arthritis. She is getting Sympony injections.  8. 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 Last event was in 2008. Xarelto stopped by heme/onc in 2014.  9. GERD 10. Vitamin B12 deficiency.  11. Cholecystectomy  12. Abdominal hysterectomy  13. Ascending aortic aneurysm: 4.5 cm by CT in 2009. 5.8 cm by CTA chest in 4/14. In 5/14, she had Bentall procedure with 21 mm bioprosthetic valved conduit as well as replacement of the ascending aorta and proximal arch. Post-op echo (6/14) showed EF 50%, septal dyssynergy, well-seated bioprosthetic aortic valve with mean gradient 16 mmHg.  14. Aortic insufficiency: Echo (4/14) with EF 60-65%, moderate AI, trileaflet aortic valve that incompletely coapts. LHC showed 3+ AI in 5/14. Aortic valve replaced with bioprosthetic valved  conduit in 5/14.  15. LHC (5/14) with no significant coronary disease.  16. Candidal esophagitis   Current Outpatient Prescriptions  Medication Sig Dispense Refill  . aspirin EC 81 MG EC tablet Take 1 tablet (81 mg total) by mouth daily.      Marland Kitchen esomeprazole (NEXIUM) 40 MG capsule Take 40 mg by mouth daily as needed. Acid reflux      . lidocaine (LIDODERM) 5 % Place 3 patches onto the skin daily as needed. Remove & Discard patch within 12 hours or as directed by MD      . oxyCODONE  (OXYCONTIN) 10 MG 12 hr tablet Take 1 tablet (10 mg total) by mouth 2 (two) times daily. Please fill on or after 02/23/13  60 tablet  0  . Probiotic Product (PROBIOTIC DAILY PO) Take by mouth daily.      Marland Kitchen rOPINIRole (REQUIP) 2 MG tablet TAKE ONE TABLET BY MOUTH THREE TIMES DAILY  90 tablet  0   No current facility-administered medications for this visit.    Allergies:    Allergies  Allergen Reactions  . Codeine Other (See Comments)    Blood pressure drops; "I pass out."  . Pramipexole Dihydrochloride Nausea Only    sick, falling  . Rofecoxib Other (See Comments)    "sleep walks"  . Arava [Leflunomide] Swelling  . Clonazepam   . Diclofenac Sodium   . Venlafaxine   . Alprazolam Other (See Comments)    "Makes me mean"  . Benzodiazepines Other (See Comments)    "Halllucinations?"  . Darvocet [Propoxyphene-Acetaminophen] Other (See Comments)    "makes my eyes dilate. Pt stated I can't see"  . Duloxetine Other (See Comments)    achy legs  . Gabapentin Other (See Comments)    headache  . Latex Itching and Dermatitis    When examined with latex gloves, burns and itches in contact areas per pt.  . Morphine And Related Other (See Comments)    Hallucinations   . Nortriptyline Hcl Other (See Comments)    Migraines   . Penicillins Rash    Can take Cephalosporins  . Prednisone Swelling    Just doesn't want to take it.    Social History:  The patient  reports that she has never smoked. She has never used smokeless tobacco. She reports that  drinks alcohol. She reports that she does not use illicit drugs.   ROS:  Please see the history of present illness.      All other systems reviewed and negative.   PHYSICAL EXAM: VS:  BP 128/68  Pulse 71  Ht 5\' 1"  (1.549 m)  Wt 149 lb (67.586 kg)  BMI 28.17 kg/m2 Well nourished, well developed, in no acute distress HEENT: normal Neck: no JVD Cardiac:  normal S1, S2; RRR; 2/6 systolic murmur at the RUSB Lungs:  clear to auscultation  bilaterally, no wheezing, rhonchi or rales Abd: soft, nontender, no hepatomegaly Ext: no edema Skin: warm and dry Neuro:  CNs 2-12 intact, no focal abnormalities noted  EKG:   NSR, HR 71, normal axis, no changes  ASSESSMENT AND PLAN:  1. Chest Pain:  Resolved.  Work-up as noted was unremarkable.  I suspect she had esophageal spasm related to inconsistent use of PPI.  She should remain on this and follow up with GI if she has more symptoms.   2. Ascending Aortic Aneurysm:  Appears stable by recent CT.  She has read the report on MyChart and is concerned about some of the  descriptions of the repair.  I will see if Dr. Laneta Simmers can look at her CT for me. 3. Aortic Insufficiency, s/p Bentall with Bioprosthetic Valved Conduit:  AVR well seated by recent echo. 4. GERD:  Continue PPI. 5. Hx of Pancreatitis:  No evidence of recurrence.  6. Hx of Pulmonary Embolism:  Chest CTA neg for PE.  Heme stopped her anticoagulation.  7. Disposition:  F/u with Dr. Marca Ancona in 03/2013 as planned.   Signed, Tereso Newcomer, PA-C  02/01/2013 10:10 AM

## 2013-02-03 ENCOUNTER — Encounter (HOSPITAL_COMMUNITY)
Admission: RE | Admit: 2013-02-03 | Discharge: 2013-02-03 | Disposition: A | Discharge status: Home / Self Care | Payer: Medicare Other | Source: Ambulatory Visit | Attending: Cardiology | Admitting: Cardiology

## 2013-02-03 DIAGNOSIS — Z5189 Encounter for other specified aftercare: Secondary | ICD-10-CM | POA: Insufficient documentation

## 2013-02-03 DIAGNOSIS — J449 Chronic obstructive pulmonary disease, unspecified: Secondary | ICD-10-CM | POA: Diagnosis not present

## 2013-02-03 DIAGNOSIS — Z954 Presence of other heart-valve replacement: Secondary | ICD-10-CM | POA: Insufficient documentation

## 2013-02-03 DIAGNOSIS — Z86711 Personal history of pulmonary embolism: Secondary | ICD-10-CM | POA: Diagnosis not present

## 2013-02-03 DIAGNOSIS — I319 Disease of pericardium, unspecified: Secondary | ICD-10-CM | POA: Diagnosis not present

## 2013-02-03 DIAGNOSIS — I712 Thoracic aortic aneurysm, without rupture, unspecified: Secondary | ICD-10-CM | POA: Diagnosis not present

## 2013-02-03 DIAGNOSIS — I359 Nonrheumatic aortic valve disorder, unspecified: Secondary | ICD-10-CM | POA: Insufficient documentation

## 2013-02-03 DIAGNOSIS — J4489 Other specified chronic obstructive pulmonary disease: Secondary | ICD-10-CM | POA: Insufficient documentation

## 2013-02-03 NOTE — Progress Notes (Signed)
Pt returned to cardiac rehab today. Pt tolerated activity without difficulty.  Asymptomatic.  Pt reports she was advised to take  Nexium BID per Tereso Newcomer, PA. Pt states she has been taking consistently.   Med list reconciled.

## 2013-02-06 ENCOUNTER — Encounter (HOSPITAL_COMMUNITY)
Admission: RE | Admit: 2013-02-06 | Discharge: 2013-02-06 | Disposition: A | Discharge status: Home / Self Care | Payer: Medicare Other | Source: Ambulatory Visit | Attending: Cardiology | Admitting: Cardiology

## 2013-02-06 ENCOUNTER — Other Ambulatory Visit: Payer: Self-pay | Admitting: Internal Medicine

## 2013-02-06 ENCOUNTER — Encounter: Payer: Self-pay | Admitting: Internal Medicine

## 2013-02-06 ENCOUNTER — Other Ambulatory Visit: Payer: Self-pay | Admitting: *Deleted

## 2013-02-06 DIAGNOSIS — G2581 Restless legs syndrome: Secondary | ICD-10-CM

## 2013-02-06 MED ORDER — ROPINIROLE HCL 2 MG PO TABS: ORAL_TABLET | ORAL | Status: DC

## 2013-02-08 ENCOUNTER — Encounter (HOSPITAL_COMMUNITY)
Admission: RE | Admit: 2013-02-08 | Discharge: 2013-02-08 | Disposition: A | Discharge status: Home / Self Care | Payer: Medicare Other | Source: Ambulatory Visit | Attending: Cardiology | Admitting: Cardiology

## 2013-02-09 ENCOUNTER — Telehealth: Payer: Self-pay | Admitting: *Deleted

## 2013-02-09 NOTE — Progress Notes (Signed)
Addendum 02/09/2013  10:26 AM:  I did have Dr. Laneta Simmers review her CTA and he felt that it looked ok. She still does have some dilatation of the aortic arch. But, this should be ok over the long term. She did have her coronaries reanastomosed to the ascending graft so if she continues to have pain she may need a cath to be sure she doesn't have an anastomotic stenosis.   Signed,  Tereso Newcomer, PA-C   02/09/2013 10:26 AM

## 2013-02-09 NOTE — Telephone Encounter (Signed)
pt notified about her chest ct results per Dr. Laneta Simmers and Bing Neighbors. PA Arota replacement look ok.Marland Kitchen

## 2013-02-10 ENCOUNTER — Encounter (HOSPITAL_COMMUNITY)
Admission: RE | Admit: 2013-02-10 | Discharge: 2013-02-10 | Disposition: A | Discharge status: Home / Self Care | Payer: Medicare Other | Source: Ambulatory Visit | Attending: Cardiology | Admitting: Cardiology

## 2013-02-13 ENCOUNTER — Encounter (HOSPITAL_COMMUNITY)
Admission: RE | Admit: 2013-02-13 | Discharge: 2013-02-13 | Disposition: A | Discharge status: Home / Self Care | Payer: Medicare Other | Source: Ambulatory Visit | Attending: Cardiology | Admitting: Cardiology

## 2013-02-13 NOTE — Progress Notes (Signed)
Diane Abbott 78 y.o. female Nutrition Note Spoke with pt.  Nutrition Plan and Nutrition Survey goals reviewed with pt. Pt is following Step 1 of the Therapeutic Lifestyle Changes diet. Per nutrition screen, pt reported she wanted to lose wt, eats less than 2 meals/d, has financial difficulties buying food, and has regular n/v, which pt currently denies. Pt states she lost 18 lbs over 1.5 months and wants to maintain her wt. Pt aware of nutrition education classes offered and is not interested in attending nutrition classes offered. Pt states, "it's more about quality of life for me than quantity." Pt is pre-diabetic according to her most recent A1c. Pre-diabetes discussed. Pt encouraged to discuss possibility of pre-diabetes with her PCP if she had further questions. Pt expressed understanding of the information reviewed.  Nutrition Diagnosis   Food-and nutrition-related knowledge deficit related to lack of exposure to information as related to diagnosis of: ? CVD ? Pre-DM (A1c 5.7)    Overweight related to excessive energy intake as evidenced by a BMI of 26.9  Nutrition RX/ Re-Estimated Daily Nutrition Needs for: wt maintenance 1600-1800 Kcal, 50-60 gm fat, 10-12 gm sat fat, 1.5-1.8 gm trans-fat, <1500 mg sodium   Nutrition Intervention   Pt's individual nutrition plan including cholesterol goals reviewed with pt.   Benefits of adopting Therapeutic Lifestyle Changes discussed when Medficts reviewed.   Pt to attend the Portion Distortion class   Continue client-centered nutrition education by RD, as part of interdisciplinary care.  Goal(s)   Pt to describe the benefit of including fruits, vegetables, whole grains, and low-fat dairy products in a heart healthy meal plan.  Monitor and Evaluate progress toward nutrition goal with team. Nutrition Risk:  Low   Mickle Plumb, M.Ed, RD, LDN, CDE 02/13/2013 9:30 AM

## 2013-02-15 ENCOUNTER — Encounter (HOSPITAL_COMMUNITY)
Admission: RE | Admit: 2013-02-15 | Discharge: 2013-02-15 | Disposition: A | Discharge status: Home / Self Care | Payer: Medicare Other | Source: Ambulatory Visit | Attending: Cardiology | Admitting: Cardiology

## 2013-02-17 ENCOUNTER — Encounter (HOSPITAL_COMMUNITY)
Admission: RE | Admit: 2013-02-17 | Discharge: 2013-02-17 | Disposition: A | Discharge status: Home / Self Care | Payer: Medicare Other | Source: Ambulatory Visit | Attending: Cardiology | Admitting: Cardiology

## 2013-02-20 ENCOUNTER — Telehealth (HOSPITAL_COMMUNITY): Payer: Self-pay | Admitting: Cardiac Rehabilitation

## 2013-02-20 ENCOUNTER — Encounter (HOSPITAL_COMMUNITY): Admission: RE | Admit: 2013-02-20 | Payer: Medicare Other | Source: Ambulatory Visit

## 2013-02-20 NOTE — Telephone Encounter (Signed)
pc received from pt she will absent from cardiac rehab today due to headache.

## 2013-02-22 ENCOUNTER — Encounter (HOSPITAL_COMMUNITY)
Admission: RE | Admit: 2013-02-22 | Discharge: 2013-02-22 | Disposition: A | Discharge status: Home / Self Care | Payer: Medicare Other | Source: Ambulatory Visit | Attending: Cardiology | Admitting: Cardiology

## 2013-02-24 ENCOUNTER — Encounter (HOSPITAL_COMMUNITY)
Admission: RE | Admit: 2013-02-24 | Discharge: 2013-02-24 | Disposition: A | Discharge status: Home / Self Care | Payer: Medicare Other | Source: Ambulatory Visit | Attending: Cardiology | Admitting: Cardiology

## 2013-02-27 ENCOUNTER — Encounter (HOSPITAL_COMMUNITY)
Admission: RE | Admit: 2013-02-27 | Discharge: 2013-02-27 | Disposition: A | Discharge status: Home / Self Care | Payer: Medicare Other | Source: Ambulatory Visit | Attending: Cardiology | Admitting: Cardiology

## 2013-03-01 ENCOUNTER — Telehealth (HOSPITAL_COMMUNITY): Payer: Self-pay | Admitting: Internal Medicine

## 2013-03-01 ENCOUNTER — Encounter (HOSPITAL_COMMUNITY): Payer: Medicare Other

## 2013-03-03 ENCOUNTER — Encounter (HOSPITAL_COMMUNITY)
Admission: RE | Admit: 2013-03-03 | Discharge: 2013-03-03 | Disposition: A | Discharge status: Home / Self Care | Payer: Medicare Other | Source: Ambulatory Visit | Attending: Cardiology | Admitting: Cardiology

## 2013-03-06 ENCOUNTER — Encounter (HOSPITAL_COMMUNITY)
Admission: RE | Admit: 2013-03-06 | Discharge: 2013-03-06 | Disposition: A | Discharge status: Home / Self Care | Payer: Medicare Other | Source: Ambulatory Visit | Attending: Cardiology | Admitting: Cardiology

## 2013-03-06 DIAGNOSIS — I712 Thoracic aortic aneurysm, without rupture, unspecified: Secondary | ICD-10-CM | POA: Insufficient documentation

## 2013-03-06 DIAGNOSIS — I359 Nonrheumatic aortic valve disorder, unspecified: Secondary | ICD-10-CM | POA: Diagnosis not present

## 2013-03-06 DIAGNOSIS — I319 Disease of pericardium, unspecified: Secondary | ICD-10-CM | POA: Diagnosis not present

## 2013-03-06 DIAGNOSIS — Z5189 Encounter for other specified aftercare: Secondary | ICD-10-CM | POA: Diagnosis not present

## 2013-03-06 DIAGNOSIS — Z954 Presence of other heart-valve replacement: Secondary | ICD-10-CM | POA: Diagnosis not present

## 2013-03-06 DIAGNOSIS — Z86711 Personal history of pulmonary embolism: Secondary | ICD-10-CM | POA: Insufficient documentation

## 2013-03-06 DIAGNOSIS — J449 Chronic obstructive pulmonary disease, unspecified: Secondary | ICD-10-CM | POA: Insufficient documentation

## 2013-03-06 DIAGNOSIS — J4489 Other specified chronic obstructive pulmonary disease: Secondary | ICD-10-CM | POA: Insufficient documentation

## 2013-03-07 DIAGNOSIS — M069 Rheumatoid arthritis, unspecified: Secondary | ICD-10-CM | POA: Diagnosis not present

## 2013-03-07 DIAGNOSIS — Z79899 Other long term (current) drug therapy: Secondary | ICD-10-CM | POA: Diagnosis not present

## 2013-03-08 ENCOUNTER — Encounter (HOSPITAL_COMMUNITY)
Admission: RE | Admit: 2013-03-08 | Discharge: 2013-03-08 | Disposition: A | Discharge status: Home / Self Care | Payer: Medicare Other | Source: Ambulatory Visit | Attending: Cardiology | Admitting: Cardiology

## 2013-03-08 DIAGNOSIS — Z954 Presence of other heart-valve replacement: Secondary | ICD-10-CM | POA: Diagnosis not present

## 2013-03-08 DIAGNOSIS — J449 Chronic obstructive pulmonary disease, unspecified: Secondary | ICD-10-CM | POA: Diagnosis not present

## 2013-03-08 DIAGNOSIS — Z5189 Encounter for other specified aftercare: Secondary | ICD-10-CM | POA: Diagnosis not present

## 2013-03-08 DIAGNOSIS — I712 Thoracic aortic aneurysm, without rupture, unspecified: Secondary | ICD-10-CM | POA: Diagnosis not present

## 2013-03-08 DIAGNOSIS — I319 Disease of pericardium, unspecified: Secondary | ICD-10-CM | POA: Diagnosis not present

## 2013-03-08 DIAGNOSIS — I359 Nonrheumatic aortic valve disorder, unspecified: Secondary | ICD-10-CM | POA: Diagnosis not present

## 2013-03-09 ENCOUNTER — Other Ambulatory Visit: Payer: Self-pay | Admitting: Internal Medicine

## 2013-03-10 ENCOUNTER — Encounter (HOSPITAL_COMMUNITY)
Admission: RE | Admit: 2013-03-10 | Discharge: 2013-03-10 | Disposition: A | Discharge status: Home / Self Care | Payer: Medicare Other | Source: Ambulatory Visit | Attending: Cardiology | Admitting: Cardiology

## 2013-03-10 DIAGNOSIS — I319 Disease of pericardium, unspecified: Secondary | ICD-10-CM | POA: Diagnosis not present

## 2013-03-10 DIAGNOSIS — I359 Nonrheumatic aortic valve disorder, unspecified: Secondary | ICD-10-CM | POA: Diagnosis not present

## 2013-03-10 DIAGNOSIS — Z954 Presence of other heart-valve replacement: Secondary | ICD-10-CM | POA: Diagnosis not present

## 2013-03-10 DIAGNOSIS — J449 Chronic obstructive pulmonary disease, unspecified: Secondary | ICD-10-CM | POA: Diagnosis not present

## 2013-03-10 DIAGNOSIS — I712 Thoracic aortic aneurysm, without rupture, unspecified: Secondary | ICD-10-CM | POA: Diagnosis not present

## 2013-03-10 DIAGNOSIS — Z5189 Encounter for other specified aftercare: Secondary | ICD-10-CM | POA: Diagnosis not present

## 2013-03-13 ENCOUNTER — Encounter (HOSPITAL_COMMUNITY)
Admission: RE | Admit: 2013-03-13 | Discharge: 2013-03-13 | Disposition: A | Discharge status: Home / Self Care | Payer: Medicare Other | Source: Ambulatory Visit | Attending: Cardiology | Admitting: Cardiology

## 2013-03-13 NOTE — Progress Notes (Signed)
Pt arrived at cardiac rehab reporting she was not feeling well today.  She reports low grade fever over weekend which resolved 7 pm last night.  Pt reports this is typical for her following her injections. Pt weight down 2 kg which patient contributes to irritable bowel symptoms this weekend with diarrhea.  Pt advised not appropriate to exercise today. Pt instructed to relax today avoiding strenous activity and increase po fluid intake.  Understanding verbalized

## 2013-03-15 ENCOUNTER — Encounter (HOSPITAL_COMMUNITY)
Admission: RE | Admit: 2013-03-15 | Discharge: 2013-03-15 | Disposition: A | Discharge status: Home / Self Care | Payer: Medicare Other | Source: Ambulatory Visit | Attending: Cardiology | Admitting: Cardiology

## 2013-03-15 DIAGNOSIS — I359 Nonrheumatic aortic valve disorder, unspecified: Secondary | ICD-10-CM | POA: Diagnosis not present

## 2013-03-15 DIAGNOSIS — I712 Thoracic aortic aneurysm, without rupture, unspecified: Secondary | ICD-10-CM | POA: Diagnosis not present

## 2013-03-15 DIAGNOSIS — J449 Chronic obstructive pulmonary disease, unspecified: Secondary | ICD-10-CM | POA: Diagnosis not present

## 2013-03-15 DIAGNOSIS — Z954 Presence of other heart-valve replacement: Secondary | ICD-10-CM | POA: Diagnosis not present

## 2013-03-15 DIAGNOSIS — I319 Disease of pericardium, unspecified: Secondary | ICD-10-CM | POA: Diagnosis not present

## 2013-03-15 DIAGNOSIS — Z5189 Encounter for other specified aftercare: Secondary | ICD-10-CM | POA: Diagnosis not present

## 2013-03-17 ENCOUNTER — Encounter (HOSPITAL_COMMUNITY)
Admission: RE | Admit: 2013-03-17 | Discharge: 2013-03-17 | Disposition: A | Discharge status: Home / Self Care | Payer: Medicare Other | Source: Ambulatory Visit | Attending: Cardiology | Admitting: Cardiology

## 2013-03-17 DIAGNOSIS — I359 Nonrheumatic aortic valve disorder, unspecified: Secondary | ICD-10-CM | POA: Diagnosis not present

## 2013-03-17 DIAGNOSIS — Z5189 Encounter for other specified aftercare: Secondary | ICD-10-CM | POA: Diagnosis not present

## 2013-03-17 DIAGNOSIS — I319 Disease of pericardium, unspecified: Secondary | ICD-10-CM | POA: Diagnosis not present

## 2013-03-17 DIAGNOSIS — I712 Thoracic aortic aneurysm, without rupture, unspecified: Secondary | ICD-10-CM | POA: Diagnosis not present

## 2013-03-17 DIAGNOSIS — J449 Chronic obstructive pulmonary disease, unspecified: Secondary | ICD-10-CM | POA: Diagnosis not present

## 2013-03-17 DIAGNOSIS — Z954 Presence of other heart-valve replacement: Secondary | ICD-10-CM | POA: Diagnosis not present

## 2013-03-20 ENCOUNTER — Encounter (HOSPITAL_COMMUNITY)
Admission: RE | Admit: 2013-03-20 | Discharge: 2013-03-20 | Disposition: A | Discharge status: Home / Self Care | Payer: Medicare Other | Source: Ambulatory Visit | Attending: Cardiology | Admitting: Cardiology

## 2013-03-20 DIAGNOSIS — I712 Thoracic aortic aneurysm, without rupture, unspecified: Secondary | ICD-10-CM | POA: Diagnosis not present

## 2013-03-20 DIAGNOSIS — Z5189 Encounter for other specified aftercare: Secondary | ICD-10-CM | POA: Diagnosis not present

## 2013-03-20 DIAGNOSIS — I319 Disease of pericardium, unspecified: Secondary | ICD-10-CM | POA: Diagnosis not present

## 2013-03-20 DIAGNOSIS — J449 Chronic obstructive pulmonary disease, unspecified: Secondary | ICD-10-CM | POA: Diagnosis not present

## 2013-03-20 DIAGNOSIS — Z954 Presence of other heart-valve replacement: Secondary | ICD-10-CM | POA: Diagnosis not present

## 2013-03-20 DIAGNOSIS — I359 Nonrheumatic aortic valve disorder, unspecified: Secondary | ICD-10-CM | POA: Diagnosis not present

## 2013-03-22 ENCOUNTER — Encounter (HOSPITAL_COMMUNITY)
Admission: RE | Admit: 2013-03-22 | Discharge: 2013-03-22 | Disposition: A | Discharge status: Home / Self Care | Payer: Medicare Other | Source: Ambulatory Visit | Attending: Cardiology | Admitting: Cardiology

## 2013-03-22 DIAGNOSIS — Z954 Presence of other heart-valve replacement: Secondary | ICD-10-CM | POA: Diagnosis not present

## 2013-03-22 DIAGNOSIS — Z5189 Encounter for other specified aftercare: Secondary | ICD-10-CM | POA: Diagnosis not present

## 2013-03-22 DIAGNOSIS — I359 Nonrheumatic aortic valve disorder, unspecified: Secondary | ICD-10-CM | POA: Diagnosis not present

## 2013-03-22 DIAGNOSIS — I712 Thoracic aortic aneurysm, without rupture, unspecified: Secondary | ICD-10-CM | POA: Diagnosis not present

## 2013-03-22 DIAGNOSIS — I319 Disease of pericardium, unspecified: Secondary | ICD-10-CM | POA: Diagnosis not present

## 2013-03-22 DIAGNOSIS — J449 Chronic obstructive pulmonary disease, unspecified: Secondary | ICD-10-CM | POA: Diagnosis not present

## 2013-03-23 ENCOUNTER — Ambulatory Visit (INDEPENDENT_AMBULATORY_CARE_PROVIDER_SITE_OTHER): Payer: Medicare Other | Admitting: Internal Medicine

## 2013-03-23 ENCOUNTER — Encounter: Payer: Self-pay | Admitting: Internal Medicine

## 2013-03-23 VITALS — BP 130/84 | HR 72 | Temp 97.5°F | Resp 16 | Wt 149.0 lb

## 2013-03-23 DIAGNOSIS — M359 Systemic involvement of connective tissue, unspecified: Secondary | ICD-10-CM

## 2013-03-23 DIAGNOSIS — F329 Major depressive disorder, single episode, unspecified: Secondary | ICD-10-CM | POA: Diagnosis not present

## 2013-03-23 DIAGNOSIS — N952 Postmenopausal atrophic vaginitis: Secondary | ICD-10-CM

## 2013-03-23 DIAGNOSIS — Z23 Encounter for immunization: Secondary | ICD-10-CM

## 2013-03-23 DIAGNOSIS — E538 Deficiency of other specified B group vitamins: Secondary | ICD-10-CM | POA: Diagnosis not present

## 2013-03-23 DIAGNOSIS — G4700 Insomnia, unspecified: Secondary | ICD-10-CM

## 2013-03-23 DIAGNOSIS — F3289 Other specified depressive episodes: Secondary | ICD-10-CM | POA: Diagnosis not present

## 2013-03-23 DIAGNOSIS — I712 Thoracic aortic aneurysm, without rupture, unspecified: Secondary | ICD-10-CM | POA: Diagnosis not present

## 2013-03-23 DIAGNOSIS — IMO0001 Reserved for inherently not codable concepts without codable children: Secondary | ICD-10-CM

## 2013-03-23 MED ORDER — RAMELTEON 8 MG PO TABS: 8.0000 mg | ORAL_TABLET | Freq: Every day | ORAL | Status: DC

## 2013-03-23 MED ORDER — ESTRADIOL 10 MCG VA TABS: 1.0000 | ORAL_TABLET | VAGINAL | Status: DC

## 2013-03-23 MED ORDER — OXYCODONE HCL ER 10 MG PO T12A: 10.0000 mg | EXTENDED_RELEASE_TABLET | Freq: Two times a day (BID) | ORAL | Status: DC

## 2013-03-23 NOTE — Assessment & Plan Note (Signed)
Continue with current prescription therapy as reflected on the Med list.  

## 2013-03-23 NOTE — Assessment & Plan Note (Signed)
Doing well 

## 2013-03-23 NOTE — Progress Notes (Signed)
Subjective:      HPI  C/o insomnia C/o vag dryness and pt would like to re-start Vagifem pv qod as before (per her GYN)  F/u R CTS  and neck pain - Dr Jeral Fruit is planning to do surgeries: she had a myelogram   S/p thor AA and AR recently repaired. AVR is repaired Dr Laneta Simmers - she went home last Wed  S/p L shoulder replacement 12/07/11 by Dr Dion Saucier. F/u pain in L arm - better   The patient is here to follow up on chronic depression, anxiety, headaches and chronic moderate fibromyalgia symptoms controlled with medicines, stable. Xarelto - we switched her to it due to Lovenox cost a few months ago. Xarelto is $400/mo.Marland KitchenMarland KitchenShe is on Sympony for RA q 2 mo  Wt Readings from Last 3 Encounters:  03/23/13 149 lb (67.586 kg)  02/01/13 149 lb (67.586 kg)  01/27/13 146 lb (66.225 kg)   BP Readings from Last 3 Encounters:  03/23/13 130/84  02/01/13 128/68  01/27/13 129/60   Past Medical History  Diagnosis Date  . Fibromyalgia   . Pulmonary embolism   . RA (rheumatoid arthritis)   . Vitamin B 12 deficiency   . Vitamin D deficiency   . Osteoarthritis   . Osteopenia   . Adrenal insufficiency   . IBS (irritable bowel syndrome)   . History of blood clots 2008    below knee  . Diabetes mellitus type II     pt denies this 03-05-11  . Esophagitis   . Depression   . GERD (gastroesophageal reflux disease)   . Connective tissue disorder   . Cataracts, bilateral     more in left than right  . Osteoarthritis of left shoulder 12/07/2011  . Heart murmur   . Leaky heart valve   . Anginal pain     "comes and goes has occured since 2012"  . Bronchitis Jan 2013-March 2013    severe  . Shortness of breath     comes from aneurysm  . Vertigo     hx of  . Urinary leakage     when coughing  . Sliding hiatal hernia   . Fatty liver    Past Surgical History  Procedure Laterality Date  . Abdominal hysterectomy    . Total knee arthroplasty      bilateral  . Cholecystectomy  2003  .  Tonsillectomy  1941  . Appendectomy  1995  . Wrist surgery      bilateral  . Bilateral oophorectomy    . Tummy tuck    . Cosmetic surgery      Tummy tuck  . Total shoulder arthroplasty  12/07/2011    Procedure: TOTAL SHOULDER ARTHROPLASTY;  Surgeon: Eulas Post, MD;  Location: [facility] OR;  Service: Orthopedics;  Laterality: Left;  left total shoulder artthroplasty  . Joint replacement Bilateral   . Cardiac catheterization  09/15/12    no PCI  . Colonoscopy    . Breast biopsy Left   . Skin cancer excision Right     cheek  . Eye surgery Bilateral     Cataract  . Bentall procedure N/A 09/29/2012    Procedure: BENTALL PROCEDURE;  Surgeon: [name], MD;  Location[facility] OR;  Service: Open Heart Surgery;  Laterality: N/A;  WITH CIRC ARREST  . Intraoperative transesophageal echocardiogram N/A 09/29/2012    Procedure: INTRAOPERATIVE TRANSESOPHAGEAL ECHOCARDIOGRAM;  Surgeon: [name], MD;  Location: [facility] OR;  Service: Open Heart Surgery;  Laterality:  N/A;    reports that she has never smoked. She has never used smokeless tobacco. She reports that she drinks alcohol. She reports that she does not use illicit drugs. family history includes Breast cancer in an other family member; COPD in her father; Clotting disorder in an other family member; Diabetes in her mother; Heart disease in her maternal grandmother; Ovarian cancer in her mother; Pancreatic cancer in an other family member. There is no history of Colon cancer. Allergies  Allergen Reactions  . Codeine Other (See Comments)    Blood pressure drops; "I pass out."  . Pramipexole Dihydrochloride Nausea Only    sick, falling  . Rofecoxib Other (See Comments)    "sleep walks"  . Arava [Leflunomide] Swelling  . Clonazepam   . Diclofenac Sodium   . Venlafaxine   . Alprazolam Other (See Comments)    "Makes me mean"  . Benzodiazepines Other (See Comments)    "Halllucinations?"  . Darvocet [Propoxyphene-Acetaminophen] Other (See Comments)     "makes my eyes dilate. Pt stated I can't see"  . Duloxetine Other (See Comments)    achy legs  . Gabapentin Other (See Comments)    headache  . Latex Itching and Dermatitis    When examined with latex gloves, burns and itches in contact areas per pt.  . Morphine And Related Other (See Comments)    Hallucinations   . Nortriptyline Hcl Other (See Comments)    Migraines   . Penicillins Rash    Can take Cephalosporins  . Prednisone Swelling    Just doesn't want to take it.   Current Outpatient Prescriptions on File Prior to Visit  Medication Sig Dispense Refill  . aspirin EC 81 MG EC tablet Take 1 tablet (81 mg total) by mouth daily.      Marland Kitchen lidocaine (LIDODERM) 5 % Place 3 patches onto the skin daily as needed. Remove & Discard patch within 12 hours or as directed by MD      . NEXIUM 40 MG capsule TAKE ONE CAPSULE BY MOUTH TWICE DAILY  60 capsule  0  . oxyCODONE (OXYCONTIN) 10 MG 12 hr tablet Take 1 tablet (10 mg total) by mouth 2 (two) times daily. Please fill on or after 02/23/13  60 tablet  0  . Probiotic Product (PROBIOTIC DAILY PO) Take by mouth daily.      Marland Kitchen rOPINIRole (REQUIP) 2 MG tablet TAKE ONE TABLET BY MOUTH THREE TIMES DAILY  90 tablet  5   No current facility-administered medications on file prior to visit.      Review of Systems  Constitutional: Negative for activity change, appetite change, fatigue and unexpected weight change.  HENT: Positive for sinus pressure. Negative for congestion and mouth sores.   Eyes: Negative for visual disturbance.  Respiratory: Negative for chest tightness.   Gastrointestinal: Negative for nausea and abdominal pain.  Genitourinary: Negative for frequency, difficulty urinating and vaginal pain.  Musculoskeletal: Positive for arthralgias and back pain. Negative for gait problem.  Skin: Negative for pallor.  Neurological: Negative for dizziness, tremors, weakness and numbness.  Psychiatric/Behavioral: Negative for confusion and  sleep disturbance.   BP 130/84  Pulse 72  Temp(Src) 97.5 F (36.4 C) (Oral)  Resp 16  Wt 149 lb (67.586 kg)     Objective:   Physical Exam  Constitutional: She appears well-developed and well-nourished. No distress.  HENT:  Head: Normocephalic.  Right Ear: External ear normal.  Left Ear: External ear normal.  Nose: Nose normal.  Mouth/Throat:  Oropharynx is clear and moist.  Eyes: Conjunctivae are normal. Pupils are equal, round, and reactive to light. Right eye exhibits no discharge. Left eye exhibits no discharge.  Neck: Normal range of motion. Neck supple. No JVD present. No tracheal deviation present. No thyromegaly present.  Cardiovascular: Normal rate, regular rhythm and normal heart sounds.   Pulmonary/Chest: No stridor. No respiratory distress. She has no wheezes.  Abdominal: Soft. Bowel sounds are normal. She exhibits no distension and no mass. There is no tenderness. There is no rebound and no guarding.  Musculoskeletal: She exhibits tenderness. She exhibits no edema.  L shoulder is not tender w/ROM Neck is tender w/ROM  Lymphadenopathy:    She has no cervical adenopathy.  Neurological: She displays normal reflexes. No cranial nerve deficit. She exhibits normal muscle tone. Coordination normal.  Skin: No rash noted. No erythema.  Psychiatric: She has a normal mood and affect. Her behavior is normal. Judgment and thought content normal.  sad    Lab Results  Component Value Date   WBC 4.3* 01/27/2013   HGB 10.9* 01/27/2013   HCT 33.0* 01/27/2013   PLT 137.0 Repeated and verified X2.* 01/27/2013   GLUCOSE 95 01/27/2013   ALT 17 01/27/2013   AST 27 01/27/2013   NA 138 01/27/2013   K 4.3 01/27/2013   CL 106 01/27/2013   CREATININE 1.1 01/27/2013   BUN 21 01/27/2013   CO2 25 01/27/2013   TSH 2.88 01/09/2011   INR 1.34 09/29/2012   HGBA1C 5.7* 09/27/2012           Assessment & Plan:

## 2013-03-23 NOTE — Assessment & Plan Note (Signed)
Will try Rozerem

## 2013-03-23 NOTE — Progress Notes (Signed)
Pre visit review using our clinic review tool, if applicable. No additional management support is needed unless otherwise documented below in the visit note. 

## 2013-03-23 NOTE — Assessment & Plan Note (Signed)
11/14 chronic -- worse  pt would like to re-start Vagifem pv qod as before (per her GYN)  Potential benefits of a long term topical HRT use as well as potential risks  and complications were explained to the patient and were aknowledged.

## 2013-03-24 ENCOUNTER — Encounter (HOSPITAL_COMMUNITY)
Admission: RE | Admit: 2013-03-24 | Discharge: 2013-03-24 | Disposition: A | Discharge status: Home / Self Care | Payer: Medicare Other | Source: Ambulatory Visit | Attending: Cardiology | Admitting: Cardiology

## 2013-03-24 DIAGNOSIS — IMO0001 Reserved for inherently not codable concepts without codable children: Secondary | ICD-10-CM | POA: Diagnosis not present

## 2013-03-24 DIAGNOSIS — I712 Thoracic aortic aneurysm, without rupture, unspecified: Secondary | ICD-10-CM | POA: Diagnosis not present

## 2013-03-24 DIAGNOSIS — I319 Disease of pericardium, unspecified: Secondary | ICD-10-CM | POA: Diagnosis not present

## 2013-03-24 DIAGNOSIS — Z954 Presence of other heart-valve replacement: Secondary | ICD-10-CM | POA: Diagnosis not present

## 2013-03-24 DIAGNOSIS — M25539 Pain in unspecified wrist: Secondary | ICD-10-CM | POA: Diagnosis not present

## 2013-03-24 DIAGNOSIS — M069 Rheumatoid arthritis, unspecified: Secondary | ICD-10-CM | POA: Diagnosis not present

## 2013-03-24 DIAGNOSIS — I359 Nonrheumatic aortic valve disorder, unspecified: Secondary | ICD-10-CM | POA: Diagnosis not present

## 2013-03-24 DIAGNOSIS — Z5189 Encounter for other specified aftercare: Secondary | ICD-10-CM | POA: Diagnosis not present

## 2013-03-24 DIAGNOSIS — G5760 Lesion of plantar nerve, unspecified lower limb: Secondary | ICD-10-CM | POA: Diagnosis not present

## 2013-03-24 DIAGNOSIS — J449 Chronic obstructive pulmonary disease, unspecified: Secondary | ICD-10-CM | POA: Diagnosis not present

## 2013-03-27 ENCOUNTER — Encounter (HOSPITAL_COMMUNITY): Payer: Medicare Other

## 2013-03-28 ENCOUNTER — Encounter: Payer: Self-pay | Admitting: Cardiology

## 2013-03-28 ENCOUNTER — Ambulatory Visit (INDEPENDENT_AMBULATORY_CARE_PROVIDER_SITE_OTHER): Payer: Medicare Other | Admitting: Cardiology

## 2013-03-28 VITALS — BP 121/70 | HR 87 | Ht 61.0 in | Wt 149.0 lb

## 2013-03-28 DIAGNOSIS — Z954 Presence of other heart-valve replacement: Secondary | ICD-10-CM

## 2013-03-28 DIAGNOSIS — I712 Thoracic aortic aneurysm, without rupture, unspecified: Secondary | ICD-10-CM | POA: Diagnosis not present

## 2013-03-28 DIAGNOSIS — I359 Nonrheumatic aortic valve disorder, unspecified: Secondary | ICD-10-CM | POA: Diagnosis not present

## 2013-03-28 DIAGNOSIS — Z952 Presence of prosthetic heart valve: Secondary | ICD-10-CM

## 2013-03-28 DIAGNOSIS — Z95828 Presence of other vascular implants and grafts: Secondary | ICD-10-CM | POA: Diagnosis not present

## 2013-03-28 DIAGNOSIS — I351 Nonrheumatic aortic (valve) insufficiency: Secondary | ICD-10-CM

## 2013-03-28 DIAGNOSIS — R079 Chest pain, unspecified: Secondary | ICD-10-CM

## 2013-03-28 NOTE — Patient Instructions (Signed)
Schedule an appointment for a chest CTA in September 2015.  Your physician wants you to follow-up in: November 2015 with Dr Shirlee Latch. You will receive a reminder letter in the mail two months in advance. If you don't receive a letter, please call our office to schedule the follow-up appointment.

## 2013-03-28 NOTE — Progress Notes (Signed)
Patient ID: Diane Abbott, female   DOB: March 20, 1935, 78 y.o.   MRN: [id number] PCP: Dr. Posey Rea  78 yo with history of OA, RA, PE/DVT, and status post Bentall bioprosthetic valved conduit and ascending aorta/proximal arch replacement presents for followup.  She had been having problems with C-spine arthritis.  She had an MRI of her neck done, which noted ascending aortic aneurysm.  Therefore, CTA chest was done.  This showed a 5.8 cm ascending aortic aneurysm extending to the proximal arch.  Echo showed moderate AI with trileaflet aortic valve.  LHC showed no significant cardiac disease and 3+ AI.   In 5/14, she had Bentall procedure with 21 mm bioprosthetic valved conduit as well as replacement of the ascending aorta and proximal arch.  Post-op echo showed EF 50%, well-seated bioprosthetic aortic valve.   Since I last saw her, she had several episodes of a sensation of chest fullness, usually while lying in the bed at night after dinner.  She had CTA chest done in the 9/14 to make sure that this did not signify any problems with her surgical ascending aorta repair.  This showed stable repaired ascending aorta and 4.8 cm arch beyond the repair.  She continues to go to cardiac rehab.  She tries to push herself at rehab. No exertional dyspnea or chest pain.    Labs (4/14): K 4.4, creatinine 1.3 Labs (7/14): K 4.5, creatinine 1.3, LFTs normal Labs (9/14): K 4.2, creatinine 1.1  PMH: 1. C-spine OA 2. Left shoulder replacement in 8/13.  3. Bilateral TKR.  4. Depression 5. Anxiety 6. Fibromyalgia 7. Rheumatoid arthritis.  She is getting Sympony injections.  8. 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  Last event was in 2008.  Xarelto stopped by heme/onc in 2014. 9. GERD 10. Vitamin B12 deficiency.  11. Cholecystectomy 12. Abdominal hysterectomy 13. Ascending aortic aneurysm: 4.5 cm by CT in 2009.  5.8 cm by CTA chest in 4/14.  In 5/14, she had Bentall procedure with 21  mm bioprosthetic valved conduit as well as replacement of the ascending aorta and proximal arch, coronaries reimplanted.  Post-op echo (6/14) showed EF 50%, septal dyssynergy, well-seated bioprosthetic aortic valve with mean gradient 16 mmHg.  CTA chest (9/14) with stable replaced ascending aorta and 4.8 cm arch beyond the replaced segment. 14. Aortic insufficiency: Echo (4/14) with EF 60-65%, moderate AI, trileaflet aortic valve that incompletely coapts.  LHC showed 3+ AI in 5/14.  Aortic valve replaced with bioprosthetic valved conduit in 5/14.  15. LHC (5/14) with no significant coronary disease.   16. Candidal esophagitis  SH: Lives in [place], nonsmoker, occasional ETOH.   FH: Familial hypercoagulable state.  Grandmother with "heart problem."   ROS: All systems reviewed and negative except as per HPI.    Current Outpatient Prescriptions  Medication Sig Dispense Refill  . aspirin EC 81 MG EC tablet Take 1 tablet (81 mg total) by mouth daily.      . Estradiol 10 MCG TABS vaginal tablet Place 1 tablet (10 mcg total) vaginally every other day.  15 tablet  5  . fluconazole (DIFLUCAN) 100 MG tablet Take 100 mg by mouth as directed.      . lidocaine (LIDODERM) 5 % Place 3 patches onto the skin daily as needed. Remove & Discard patch within 12 hours or as directed by MD      . NEXIUM 40 MG capsule TAKE ONE CAPSULE BY MOUTH TWICE DAILY  60 capsule  0  .  OxyCODONE (OXYCONTIN) 10 mg T12A 12 hr tablet Take 1 tablet (10 mg total) by mouth every 12 (twelve) hours. Please fill on or after 04/25/13  60 tablet  0  . Probiotic Product (PROBIOTIC DAILY PO) Take by mouth daily.      . ramelteon (ROZEREM) 8 MG tablet Take 1 tablet (8 mg total) by mouth at bedtime.  30 tablet  5  . rOPINIRole (REQUIP) 2 MG tablet TAKE ONE TABLET BY MOUTH THREE TIMES DAILY  90 tablet  5   No current facility-administered medications for this visit.   BP 121/70  Pulse 87  Ht 5\' 1"  (1.549 m)  Wt 67.586 kg (149 lb)  BMI  28.17 kg/m2 General: NAD Neck: JVP 7 cm, no thyromegaly or thyroid nodule.  Lungs: Clear to auscultation bilaterally with normal respiratory effort. CV: Nondisplaced PMI.  Heart regular S1/S2, no S3/S4, 2/6 early SEM.  No peripheral edema.  No carotid bruit.  Normal pedal pulses.  Abdomen: Soft, nontender, no hepatosplenomegaly, no distention.  Skin: Intact without lesions or rashes.  Neurologic: Alert and oriented x 3.  Psych: Normal affect. Extremities: No clubbing or cyanosis.  HEENT: Normal.   Assessment/Plan: 1. Ascending aortic aneurysm: Now status post graft replacement of the ascending aorta and the proximal arch.  4.8 cm arch beyond the repair on 9/14 CTA.  I will arrange for repeat CTA chest in 9/15.  2. Aortic insufficiency: 3+ AI. Therefore, patient had bioprosthetic valved conduit placed when ascending aorta was repaired.  Valve was well-seated on post-op echo in 6/14.  3. History of venous thromboembolism: Xarelto was stopped by heme/onc. 4. Chest fullness: Nonexertional episodes usually when lying in bed after dinner.  I suspect this represents GERD .   Followup in 1 year.    Marca Ancona 03/28/2013

## 2013-03-29 ENCOUNTER — Encounter (HOSPITAL_COMMUNITY): Admission: RE | Admit: 2013-03-29 | Payer: Medicare Other | Source: Ambulatory Visit

## 2013-03-29 ENCOUNTER — Telehealth (HOSPITAL_COMMUNITY): Payer: Self-pay | Admitting: Cardiac Rehabilitation

## 2013-03-29 NOTE — Telephone Encounter (Signed)
pc received from pt she will be absent again today from cardiac rehab.  She overslept.  Monday she was absent due to low grade fever.  She is currently on antibiotic therapy

## 2013-03-31 ENCOUNTER — Encounter (HOSPITAL_COMMUNITY): Payer: Medicare Other

## 2013-04-01 ENCOUNTER — Encounter: Payer: Self-pay | Admitting: Internal Medicine

## 2013-04-03 ENCOUNTER — Encounter (HOSPITAL_COMMUNITY)
Admission: RE | Admit: 2013-04-03 | Discharge: 2013-04-03 | Disposition: A | Discharge status: Home / Self Care | Payer: Medicare Other | Source: Ambulatory Visit | Attending: Cardiology | Admitting: Cardiology

## 2013-04-03 DIAGNOSIS — Z5189 Encounter for other specified aftercare: Secondary | ICD-10-CM | POA: Insufficient documentation

## 2013-04-03 DIAGNOSIS — J4489 Other specified chronic obstructive pulmonary disease: Secondary | ICD-10-CM | POA: Insufficient documentation

## 2013-04-03 DIAGNOSIS — Z954 Presence of other heart-valve replacement: Secondary | ICD-10-CM | POA: Diagnosis not present

## 2013-04-03 DIAGNOSIS — I712 Thoracic aortic aneurysm, without rupture, unspecified: Secondary | ICD-10-CM | POA: Insufficient documentation

## 2013-04-03 DIAGNOSIS — I359 Nonrheumatic aortic valve disorder, unspecified: Secondary | ICD-10-CM | POA: Insufficient documentation

## 2013-04-03 DIAGNOSIS — I319 Disease of pericardium, unspecified: Secondary | ICD-10-CM | POA: Insufficient documentation

## 2013-04-03 DIAGNOSIS — J449 Chronic obstructive pulmonary disease, unspecified: Secondary | ICD-10-CM | POA: Insufficient documentation

## 2013-04-03 DIAGNOSIS — Z86711 Personal history of pulmonary embolism: Secondary | ICD-10-CM | POA: Diagnosis not present

## 2013-04-03 NOTE — Progress Notes (Addendum)
Pt c/o dyspnea on exertion with bike test today. Bike test stopped.  Pt reports she has continued nasal congestion with watery eyes. Pt states she has finished her course of zithromax, which she self prescribed using her dental prescription and has a refill available.   Pt also states she has an inhaler but does not use it.  Pt instructed to bring inhaler with her to next session since it is not listed on her medication list.   Pt states she has been painting her home although she has been told in the past by an allergist she is allergic to paint.  Pt states she is not able to afford to hire a Education administrator, therefore she will do it herself.  Pt also states she prefers to get up and work on projects when she has difficulty sleeping.  Pt has been given a new sleep agent but has been hesitant to use it.  Pt reports  that she has been taking advil cold and sinus for her paint exposure and continued sinus congestion.   Pt instructed to avoid products containing sudafed, Coricidin or benadryl would be more appropriate.  Pt  instructed to contact PCP for continued sinus congestion without relief from Zpack.    Pt also cautioned to take frequent breaks from painting, use proper ventilation and wear a mask for protection.  Understanding verbalized

## 2013-04-05 ENCOUNTER — Encounter (HOSPITAL_COMMUNITY): Payer: Medicare Other

## 2013-04-05 ENCOUNTER — Telehealth (HOSPITAL_COMMUNITY): Payer: Self-pay | Admitting: Cardiac Rehabilitation

## 2013-04-05 NOTE — Telephone Encounter (Signed)
pc received from pt stating she will be absent from cardiac due to fever.  Pt states she has taken her zpack as prescribed by her dentist for suspected sinus infection.  However off and on fever persists with continued sinus congestion and now cough.  Pt states she has not contacted her PCP as instructed.  Pt again instructed to contact her PCP for evaluation since symptoms are unimproved and worsening.  Pt verbalized understanding.

## 2013-04-07 ENCOUNTER — Telehealth (HOSPITAL_COMMUNITY): Payer: Self-pay | Admitting: *Deleted

## 2013-04-07 ENCOUNTER — Encounter (HOSPITAL_COMMUNITY): Admission: RE | Admit: 2013-04-07 | Payer: Medicare Other | Source: Ambulatory Visit

## 2013-04-10 ENCOUNTER — Encounter (HOSPITAL_COMMUNITY): Payer: Medicare Other

## 2013-04-10 ENCOUNTER — Telehealth (HOSPITAL_COMMUNITY): Payer: Self-pay | Admitting: Internal Medicine

## 2013-04-11 ENCOUNTER — Telehealth (HOSPITAL_COMMUNITY): Payer: Self-pay | Admitting: Cardiac Rehabilitation

## 2013-04-11 NOTE — Telephone Encounter (Addendum)
pc to pt to discuss continued absence.  Pt states she spoke to Dr. Loren Racer triage nurse who advised per Dr. Posey Rea no change in regimen since pt has currently taking Zpack.  Pt now has bronchitis symptoms therefore  rest recommended.  Pt is currently using combivent 20/100 mcg as prescribed.  Pt states she is following instructions and has put off her heavy house projects.  Pt verbalizes understanding of importance of contacting Dr. Posey Rea if symptoms persist or worsen.  Pt will remain absent from cardiac rehab the remainder of this week to facilitate healing.

## 2013-04-12 ENCOUNTER — Encounter (HOSPITAL_COMMUNITY): Admission: RE | Admit: 2013-04-12 | Payer: Medicare Other | Source: Ambulatory Visit

## 2013-04-14 ENCOUNTER — Encounter (HOSPITAL_COMMUNITY): Payer: Medicare Other

## 2013-04-17 ENCOUNTER — Telehealth (HOSPITAL_COMMUNITY): Payer: Self-pay | Admitting: Cardiac Rehabilitation

## 2013-04-17 ENCOUNTER — Encounter (HOSPITAL_COMMUNITY): Payer: Medicare Other

## 2013-04-17 NOTE — Telephone Encounter (Signed)
pc received from pt stating she would like to withdraw from cardiac rehab program at this time.  Pt has been absent with respiratory illness for >2 weeks and now feels she has too much to do to prepare for the holidays to be able to attend class.  Pt would like to participate in cardiac maintenance program.  Order has been faxed.   Pt has had good participation in cardiac rehab.  She is a very Chief Executive Officer and in fact has been counseled on need to decrease her workload and heavy cleaning regimen at home. Pt verbalized understanding.

## 2013-04-19 ENCOUNTER — Encounter (HOSPITAL_COMMUNITY): Payer: Medicare Other

## 2013-04-21 ENCOUNTER — Encounter (HOSPITAL_COMMUNITY): Payer: Medicare Other

## 2013-04-24 ENCOUNTER — Encounter (HOSPITAL_COMMUNITY): Payer: Medicare Other

## 2013-04-26 ENCOUNTER — Encounter (HOSPITAL_COMMUNITY): Payer: Medicare Other

## 2013-05-01 ENCOUNTER — Encounter (HOSPITAL_COMMUNITY): Payer: Medicare Other

## 2013-05-03 ENCOUNTER — Encounter (HOSPITAL_COMMUNITY): Payer: Medicare Other

## 2013-05-05 ENCOUNTER — Encounter (HOSPITAL_COMMUNITY): Payer: Medicare Other

## 2013-05-10 DIAGNOSIS — M069 Rheumatoid arthritis, unspecified: Secondary | ICD-10-CM | POA: Diagnosis not present

## 2013-05-10 DIAGNOSIS — Z79899 Other long term (current) drug therapy: Secondary | ICD-10-CM | POA: Diagnosis not present

## 2013-05-15 ENCOUNTER — Encounter (HOSPITAL_COMMUNITY)
Admission: RE | Admit: 2013-05-15 | Discharge: 2013-05-15 | Disposition: A | Discharge status: Home / Self Care | Payer: Self-pay | Source: Ambulatory Visit | Attending: Cardiology | Admitting: Cardiology

## 2013-05-15 DIAGNOSIS — I359 Nonrheumatic aortic valve disorder, unspecified: Secondary | ICD-10-CM | POA: Insufficient documentation

## 2013-05-15 DIAGNOSIS — I319 Disease of pericardium, unspecified: Secondary | ICD-10-CM | POA: Insufficient documentation

## 2013-05-15 DIAGNOSIS — J449 Chronic obstructive pulmonary disease, unspecified: Secondary | ICD-10-CM | POA: Insufficient documentation

## 2013-05-15 DIAGNOSIS — Z86711 Personal history of pulmonary embolism: Secondary | ICD-10-CM | POA: Insufficient documentation

## 2013-05-15 DIAGNOSIS — I712 Thoracic aortic aneurysm, without rupture, unspecified: Secondary | ICD-10-CM | POA: Insufficient documentation

## 2013-05-15 DIAGNOSIS — J4489 Other specified chronic obstructive pulmonary disease: Secondary | ICD-10-CM | POA: Insufficient documentation

## 2013-05-15 DIAGNOSIS — Z954 Presence of other heart-valve replacement: Secondary | ICD-10-CM | POA: Insufficient documentation

## 2013-05-15 DIAGNOSIS — Z5189 Encounter for other specified aftercare: Secondary | ICD-10-CM | POA: Insufficient documentation

## 2013-05-17 ENCOUNTER — Encounter (HOSPITAL_COMMUNITY): Payer: Self-pay

## 2013-05-19 ENCOUNTER — Encounter (HOSPITAL_COMMUNITY)
Admission: RE | Admit: 2013-05-19 | Discharge: 2013-05-19 | Disposition: A | Discharge status: Home / Self Care | Payer: Self-pay | Source: Ambulatory Visit | Attending: Cardiology | Admitting: Cardiology

## 2013-05-22 ENCOUNTER — Encounter (HOSPITAL_COMMUNITY)
Admission: RE | Admit: 2013-05-22 | Discharge: 2013-05-22 | Disposition: A | Discharge status: Home / Self Care | Payer: Self-pay | Source: Ambulatory Visit | Attending: Cardiology | Admitting: Cardiology

## 2013-05-24 ENCOUNTER — Encounter (HOSPITAL_COMMUNITY): Admission: RE | Admit: 2013-05-24 | Payer: Self-pay | Source: Ambulatory Visit

## 2013-05-24 ENCOUNTER — Telehealth (HOSPITAL_COMMUNITY): Payer: Self-pay | Admitting: *Deleted

## 2013-05-26 ENCOUNTER — Encounter (HOSPITAL_COMMUNITY)
Admission: RE | Admit: 2013-05-26 | Discharge: 2013-05-26 | Disposition: A | Discharge status: Home / Self Care | Payer: Self-pay | Source: Ambulatory Visit | Attending: Cardiology | Admitting: Cardiology

## 2013-05-26 ENCOUNTER — Encounter: Payer: Self-pay | Admitting: Internal Medicine

## 2013-05-26 ENCOUNTER — Ambulatory Visit (INDEPENDENT_AMBULATORY_CARE_PROVIDER_SITE_OTHER): Payer: Medicare Other | Admitting: Internal Medicine

## 2013-05-26 VITALS — BP 126/70 | HR 76 | Temp 97.9°F | Resp 16 | Wt 156.0 lb

## 2013-05-26 DIAGNOSIS — Z952 Presence of prosthetic heart valve: Secondary | ICD-10-CM

## 2013-05-26 DIAGNOSIS — Z95828 Presence of other vascular implants and grafts: Secondary | ICD-10-CM | POA: Diagnosis not present

## 2013-05-26 DIAGNOSIS — I712 Thoracic aortic aneurysm, without rupture, unspecified: Secondary | ICD-10-CM

## 2013-05-26 DIAGNOSIS — IMO0001 Reserved for inherently not codable concepts without codable children: Secondary | ICD-10-CM

## 2013-05-26 DIAGNOSIS — E538 Deficiency of other specified B group vitamins: Secondary | ICD-10-CM

## 2013-05-26 DIAGNOSIS — Z954 Presence of other heart-valve replacement: Secondary | ICD-10-CM | POA: Diagnosis not present

## 2013-05-26 DIAGNOSIS — E559 Vitamin D deficiency, unspecified: Secondary | ICD-10-CM

## 2013-05-26 DIAGNOSIS — L910 Hypertrophic scar: Secondary | ICD-10-CM | POA: Insufficient documentation

## 2013-05-26 MED ORDER — RAMELTEON 8 MG PO TABS: 8.0000 mg | ORAL_TABLET | Freq: Every day | ORAL | Status: DC

## 2013-05-26 MED ORDER — TRIAMCINOLONE ACETONIDE 0.5 % EX CREA: 1.0000 "application " | TOPICAL_CREAM | Freq: Three times a day (TID) | CUTANEOUS | Status: DC

## 2013-05-26 MED ORDER — OXYCODONE HCL ER 10 MG PO T12A: 10.0000 mg | EXTENDED_RELEASE_TABLET | Freq: Two times a day (BID) | ORAL | Status: DC

## 2013-05-26 NOTE — Assessment & Plan Note (Signed)
Continue with current prescription therapy as reflected on the Med list.  

## 2013-05-26 NOTE — Assessment & Plan Note (Signed)
Will try triamcinolone cream

## 2013-05-26 NOTE — Assessment & Plan Note (Signed)
Doing well 

## 2013-05-26 NOTE — Progress Notes (Signed)
Subjective:      HPI  F/u  insomnia F/u vag dryness and pt would like to re-start Vagifem pv qod as before (per her GYN)  F/u R CTS  and neck pain - Dr Joya Salm is planning to do surgeries: she had a myelogram   S/p thor AA and AR recently repaired. AVR is repaired Dr Cyndia Bent - she is c/o scar area irritation/pain  S/p L shoulder replacement 12/07/11 by Dr Mardelle Matte. F/u pain in L arm - better   The patient is here to follow up on chronic depression, anxiety, headaches and chronic moderate fibromyalgia symptoms controlled with medicines, stable. She is on Sympony for RA q 2 mo  Hematol said - no need for anticoagulation  Wt Readings from Last 3 Encounters:  05/26/13 156 lb (70.761 kg)  03/28/13 149 lb (67.586 kg)  03/23/13 149 lb (67.586 kg)   BP Readings from Last 3 Encounters:  05/26/13 126/70  03/28/13 121/70  03/23/13 130/84   Past Medical History  Diagnosis Date  . Fibromyalgia   . Pulmonary embolism   . RA (rheumatoid arthritis)   . Vitamin B 12 deficiency   . Vitamin D deficiency   . Osteoarthritis   . Osteopenia   . Adrenal insufficiency   . IBS (irritable bowel syndrome)   . History of blood clots 2008    below knee  . Diabetes mellitus type II     pt denies this 03-05-11  . Esophagitis   . Depression   . GERD (gastroesophageal reflux disease)   . Connective tissue disorder   . Cataracts, bilateral     more in left than right  . Osteoarthritis of left shoulder 12/07/2011  . Heart murmur   . Leaky heart valve   . Anginal pain     "comes and goes has occured since 2012"  . Bronchitis Jan 2013-March 2013    severe  . Shortness of breath     comes from aneurysm  . Vertigo     hx of  . Urinary leakage     when coughing  . Sliding hiatal hernia   . Fatty liver    Past Surgical History  Procedure Laterality Date  . Abdominal hysterectomy    . Total knee arthroplasty      bilateral  . Cholecystectomy  2003  . Tonsillectomy  1941  . Appendectomy   1995  . Wrist surgery      bilateral  . Bilateral oophorectomy    . Tummy tuck    . Cosmetic surgery      Tummy tuck  . Total shoulder arthroplasty  12/07/2011    Procedure: TOTAL SHOULDER ARTHROPLASTY;  Surgeon: Johnny Bridge, MD;  Location: [facility];  Service: Orthopedics;  Laterality: Left;  left total shoulder artthroplasty  . Joint replacement Bilateral   . Cardiac catheterization  09/15/12    no PCI  . Colonoscopy    . Breast biopsy Left   . Skin cancer excision Right     cheek  . Eye surgery Bilateral     Cataract  . Bentall procedure N/A 09/29/2012    Procedure: BENTALL PROCEDURE;  Surgeon: Gaye Pollack, MD;  Location: [facility];  Service: Open Heart Surgery;  Laterality: N/A;  WITH CIRC ARREST  . Intraoperative transesophageal echocardiogram N/A 09/29/2012    Procedure: INTRAOPERATIVE TRANSESOPHAGEAL ECHOCARDIOGRAM;  Surgeon: Gaye Pollack, MD;  Location: [facility] OR;  Service: Open Heart Surgery;  Laterality: N/A;    reports that she  has never smoked. She has never used smokeless tobacco. She reports that she drinks alcohol. She reports that she does not use illicit drugs. family history includes Breast cancer in an other family member; COPD in her father; Clotting disorder in an other family member; Diabetes in her mother; Heart disease in her maternal grandmother; Ovarian cancer in her mother; Pancreatic cancer in an other family member. There is no history of Colon cancer. Allergies  Allergen Reactions  . Codeine Other (See Comments)    Blood pressure drops; "I pass out."  . Pramipexole Dihydrochloride Nausea Only    sick, falling  . Rofecoxib Other (See Comments)    "sleep walks"  . Arava [Leflunomide] Swelling  . Clonazepam   . Diclofenac Sodium   . Venlafaxine   . Alprazolam Other (See Comments)    "Makes me mean"  . Benzodiazepines Other (See Comments)    "Halllucinations?"  . Darvocet [Propoxyphene N-Acetaminophen] Other (See Comments)    "makes my eyes dilate. Pt  stated I can't see"  . Duloxetine Other (See Comments)    achy legs  . Gabapentin Other (See Comments)    headache  . Latex Itching and Dermatitis    When examined with latex gloves, burns and itches in contact areas per pt.  . Morphine And Related Other (See Comments)    Hallucinations   . Nortriptyline Hcl Other (See Comments)    Migraines   . Penicillins Rash    Can take Cephalosporins  . Prednisone Swelling    Just doesn't want to take it.   Current Outpatient Prescriptions on File Prior to Visit  Medication Sig Dispense Refill  . aspirin EC 81 MG EC tablet Take 1 tablet (81 mg total) by mouth daily.      . Estradiol 10 MCG TABS vaginal tablet Place 1 tablet (10 mcg total) vaginally every other day.  15 tablet  5  . fluconazole (DIFLUCAN) 100 MG tablet Take 100 mg by mouth as directed.      . lidocaine (LIDODERM) 5 % Place 3 patches onto the skin daily as needed. Remove & Discard patch within 12 hours or as directed by MD      . NEXIUM 40 MG capsule TAKE ONE CAPSULE BY MOUTH TWICE DAILY  60 capsule  0  . Probiotic Product (PROBIOTIC DAILY PO) Take by mouth daily.      Marland Kitchen rOPINIRole (REQUIP) 2 MG tablet TAKE ONE TABLET BY MOUTH THREE TIMES DAILY  90 tablet  5   No current facility-administered medications on file prior to visit.      Review of Systems  Constitutional: Negative for activity change, appetite change, fatigue and unexpected weight change.  HENT: Positive for sinus pressure. Negative for congestion and mouth sores.   Eyes: Negative for visual disturbance.  Respiratory: Negative for chest tightness.   Gastrointestinal: Negative for nausea and abdominal pain.  Genitourinary: Negative for frequency, difficulty urinating and vaginal pain.  Musculoskeletal: Positive for arthralgias and back pain. Negative for gait problem.  Skin: Negative for pallor.  Neurological: Negative for dizziness, tremors, weakness and numbness.  Psychiatric/Behavioral: Negative for  confusion and sleep disturbance.   BP 126/70  Pulse 76  Temp(Src) 97.9 F (36.6 C) (Oral)  Resp 16  Wt 156 lb (70.761 kg)     Objective:   Physical Exam  Constitutional: She appears well-developed and well-nourished. No distress.  HENT:  Head: Normocephalic.  Right Ear: External ear normal.  Left Ear: External ear normal.  Nose: Nose  normal.  Mouth/Throat: Oropharynx is clear and moist.  Eyes: Conjunctivae are normal. Pupils are equal, round, and reactive to light. Right eye exhibits no discharge. Left eye exhibits no discharge.  Neck: Normal range of motion. Neck supple. No JVD present. No tracheal deviation present. No thyromegaly present.  Cardiovascular: Normal rate, regular rhythm and normal heart sounds.   Pulmonary/Chest: No stridor. No respiratory distress. She has no wheezes.  Abdominal: Soft. Bowel sounds are normal. She exhibits no distension and no mass. There is no tenderness. There is no rebound and no guarding.  Musculoskeletal: She exhibits tenderness. She exhibits no edema.  L shoulder is not tender w/ROM Neck is tender w/ROM  Lymphadenopathy:    She has no cervical adenopathy.  Neurological: She displays normal reflexes. No cranial nerve deficit. She exhibits normal muscle tone. Coordination normal.  Skin: No rash noted. No erythema.  Psychiatric: She has a normal mood and affect. Her behavior is normal. Judgment and thought content normal.  sad  keloid looking scar, tender - chest  Lab Results  Component Value Date   WBC 4.3* 01/27/2013   HGB 10.9* 01/27/2013   HCT 33.0* 01/27/2013   PLT 137.0 Repeated and verified X2.* 01/27/2013   GLUCOSE 95 01/27/2013   ALT 17 01/27/2013   AST 27 01/27/2013   NA 138 01/27/2013   K 4.3 01/27/2013   CL 106 01/27/2013   CREATININE 1.1 01/27/2013   BUN 21 01/27/2013   CO2 25 01/27/2013   TSH 2.88 01/09/2011   INR 1.34 09/29/2012   HGBA1C 5.7* 09/27/2012           Assessment & Plan:

## 2013-05-26 NOTE — Assessment & Plan Note (Signed)
Re-start B12 

## 2013-05-26 NOTE — Progress Notes (Signed)
Pre visit review using our clinic review tool, if applicable. No additional management support is needed unless otherwise documented below in the visit note. 

## 2013-05-29 ENCOUNTER — Encounter (HOSPITAL_COMMUNITY): Payer: Self-pay

## 2013-05-29 ENCOUNTER — Telehealth: Payer: Self-pay | Admitting: Internal Medicine

## 2013-05-29 NOTE — Telephone Encounter (Signed)
Did you discuss this with pt?

## 2013-05-29 NOTE — Telephone Encounter (Signed)
Pt thought she was to have an RX for cream for her scar.

## 2013-05-30 NOTE — Telephone Encounter (Signed)
Yes. It was emailed on 1/23 - Triamcinolone 0.5% Thx

## 2013-05-30 NOTE — Telephone Encounter (Signed)
Pt informed

## 2013-05-31 ENCOUNTER — Encounter (HOSPITAL_COMMUNITY)
Admission: RE | Admit: 2013-05-31 | Discharge: 2013-05-31 | Disposition: A | Discharge status: Home / Self Care | Payer: Self-pay | Source: Ambulatory Visit | Attending: Cardiology | Admitting: Cardiology

## 2013-06-02 ENCOUNTER — Encounter (HOSPITAL_COMMUNITY)
Admission: RE | Admit: 2013-06-02 | Discharge: 2013-06-02 | Disposition: A | Discharge status: Home / Self Care | Payer: Self-pay | Source: Ambulatory Visit | Attending: Cardiology | Admitting: Cardiology

## 2013-06-05 ENCOUNTER — Encounter (HOSPITAL_COMMUNITY)
Admission: RE | Admit: 2013-06-05 | Discharge: 2013-06-05 | Disposition: A | Discharge status: Home / Self Care | Payer: Self-pay | Source: Ambulatory Visit | Attending: Cardiology | Admitting: Cardiology

## 2013-06-05 DIAGNOSIS — I359 Nonrheumatic aortic valve disorder, unspecified: Secondary | ICD-10-CM | POA: Insufficient documentation

## 2013-06-05 DIAGNOSIS — J449 Chronic obstructive pulmonary disease, unspecified: Secondary | ICD-10-CM | POA: Insufficient documentation

## 2013-06-05 DIAGNOSIS — J4489 Other specified chronic obstructive pulmonary disease: Secondary | ICD-10-CM | POA: Insufficient documentation

## 2013-06-05 DIAGNOSIS — I319 Disease of pericardium, unspecified: Secondary | ICD-10-CM | POA: Insufficient documentation

## 2013-06-05 DIAGNOSIS — I712 Thoracic aortic aneurysm, without rupture, unspecified: Secondary | ICD-10-CM | POA: Insufficient documentation

## 2013-06-05 DIAGNOSIS — Z86711 Personal history of pulmonary embolism: Secondary | ICD-10-CM | POA: Insufficient documentation

## 2013-06-05 DIAGNOSIS — Z5189 Encounter for other specified aftercare: Secondary | ICD-10-CM | POA: Insufficient documentation

## 2013-06-05 DIAGNOSIS — Z954 Presence of other heart-valve replacement: Secondary | ICD-10-CM | POA: Insufficient documentation

## 2013-06-07 ENCOUNTER — Encounter (HOSPITAL_COMMUNITY)
Admission: RE | Admit: 2013-06-07 | Discharge: 2013-06-07 | Disposition: A | Discharge status: Home / Self Care | Payer: Self-pay | Source: Ambulatory Visit | Attending: Cardiology | Admitting: Cardiology

## 2013-06-09 ENCOUNTER — Encounter (HOSPITAL_COMMUNITY): Payer: Self-pay

## 2013-06-12 ENCOUNTER — Encounter (HOSPITAL_COMMUNITY): Admission: RE | Admit: 2013-06-12 | Payer: Self-pay | Source: Ambulatory Visit

## 2013-06-12 ENCOUNTER — Other Ambulatory Visit: Payer: Self-pay | Admitting: Internal Medicine

## 2013-06-12 DIAGNOSIS — N63 Unspecified lump in unspecified breast: Secondary | ICD-10-CM

## 2013-06-14 ENCOUNTER — Other Ambulatory Visit: Payer: Self-pay

## 2013-06-14 ENCOUNTER — Encounter (HOSPITAL_COMMUNITY)
Admission: RE | Admit: 2013-06-14 | Discharge: 2013-06-14 | Disposition: A | Discharge status: Home / Self Care | Payer: Self-pay | Source: Ambulatory Visit | Attending: Cardiology | Admitting: Cardiology

## 2013-06-14 ENCOUNTER — Other Ambulatory Visit: Payer: Self-pay | Admitting: Internal Medicine

## 2013-06-14 DIAGNOSIS — N63 Unspecified lump in unspecified breast: Secondary | ICD-10-CM

## 2013-06-16 ENCOUNTER — Encounter (HOSPITAL_COMMUNITY)
Admission: RE | Admit: 2013-06-16 | Discharge: 2013-06-16 | Disposition: A | Discharge status: Home / Self Care | Payer: Self-pay | Source: Ambulatory Visit | Attending: Cardiology | Admitting: Cardiology

## 2013-06-19 ENCOUNTER — Encounter (HOSPITAL_COMMUNITY)
Admission: RE | Admit: 2013-06-19 | Discharge: 2013-06-19 | Disposition: A | Discharge status: Home / Self Care | Payer: Self-pay | Source: Ambulatory Visit | Attending: Cardiology | Admitting: Cardiology

## 2013-06-20 ENCOUNTER — Inpatient Hospital Stay: Admission: RE | Admit: 2013-06-20 | Payer: Medicare Other | Source: Ambulatory Visit

## 2013-06-21 ENCOUNTER — Encounter (HOSPITAL_COMMUNITY): Payer: Self-pay

## 2013-06-23 ENCOUNTER — Encounter (HOSPITAL_COMMUNITY): Payer: Self-pay

## 2013-06-26 ENCOUNTER — Encounter (HOSPITAL_COMMUNITY): Payer: Self-pay

## 2013-06-28 ENCOUNTER — Encounter (HOSPITAL_COMMUNITY): Payer: Self-pay

## 2013-06-29 ENCOUNTER — Other Ambulatory Visit: Payer: Medicare Other

## 2013-06-30 ENCOUNTER — Encounter (HOSPITAL_COMMUNITY): Payer: Self-pay

## 2013-07-03 ENCOUNTER — Encounter (HOSPITAL_COMMUNITY): Payer: Medicare Other | Attending: Cardiology

## 2013-07-03 DIAGNOSIS — I712 Thoracic aortic aneurysm, without rupture, unspecified: Secondary | ICD-10-CM | POA: Insufficient documentation

## 2013-07-03 DIAGNOSIS — I319 Disease of pericardium, unspecified: Secondary | ICD-10-CM | POA: Insufficient documentation

## 2013-07-03 DIAGNOSIS — J4489 Other specified chronic obstructive pulmonary disease: Secondary | ICD-10-CM | POA: Insufficient documentation

## 2013-07-03 DIAGNOSIS — Z79899 Other long term (current) drug therapy: Secondary | ICD-10-CM | POA: Diagnosis not present

## 2013-07-03 DIAGNOSIS — Z5189 Encounter for other specified aftercare: Secondary | ICD-10-CM | POA: Insufficient documentation

## 2013-07-03 DIAGNOSIS — Z86711 Personal history of pulmonary embolism: Secondary | ICD-10-CM | POA: Insufficient documentation

## 2013-07-03 DIAGNOSIS — J449 Chronic obstructive pulmonary disease, unspecified: Secondary | ICD-10-CM | POA: Insufficient documentation

## 2013-07-03 DIAGNOSIS — I359 Nonrheumatic aortic valve disorder, unspecified: Secondary | ICD-10-CM | POA: Insufficient documentation

## 2013-07-03 DIAGNOSIS — Z954 Presence of other heart-valve replacement: Secondary | ICD-10-CM | POA: Insufficient documentation

## 2013-07-04 ENCOUNTER — Other Ambulatory Visit: Payer: Medicare Other

## 2013-07-05 ENCOUNTER — Encounter (HOSPITAL_COMMUNITY): Payer: Self-pay

## 2013-07-07 ENCOUNTER — Encounter (HOSPITAL_COMMUNITY): Payer: Self-pay

## 2013-07-07 ENCOUNTER — Emergency Department (HOSPITAL_BASED_OUTPATIENT_CLINIC_OR_DEPARTMENT_OTHER): Payer: Medicare Other

## 2013-07-07 ENCOUNTER — Encounter (HOSPITAL_BASED_OUTPATIENT_CLINIC_OR_DEPARTMENT_OTHER): Payer: Self-pay | Admitting: Emergency Medicine

## 2013-07-07 ENCOUNTER — Emergency Department (HOSPITAL_BASED_OUTPATIENT_CLINIC_OR_DEPARTMENT_OTHER)
Admission: EM | Admit: 2013-07-07 | Discharge: 2013-07-07 | Disposition: A | Discharge status: Home / Self Care | Payer: Medicare Other | Attending: Emergency Medicine | Admitting: Emergency Medicine

## 2013-07-07 DIAGNOSIS — K219 Gastro-esophageal reflux disease without esophagitis: Secondary | ICD-10-CM | POA: Diagnosis not present

## 2013-07-07 DIAGNOSIS — Z8669 Personal history of other diseases of the nervous system and sense organs: Secondary | ICD-10-CM | POA: Insufficient documentation

## 2013-07-07 DIAGNOSIS — S62233A Other displaced fracture of base of first metacarpal bone, unspecified hand, initial encounter for closed fracture: Secondary | ICD-10-CM | POA: Diagnosis not present

## 2013-07-07 DIAGNOSIS — S62319A Displaced fracture of base of unspecified metacarpal bone, initial encounter for closed fracture: Secondary | ICD-10-CM | POA: Diagnosis not present

## 2013-07-07 DIAGNOSIS — M542 Cervicalgia: Secondary | ICD-10-CM | POA: Diagnosis not present

## 2013-07-07 DIAGNOSIS — S0003XA Contusion of scalp, initial encounter: Secondary | ICD-10-CM | POA: Insufficient documentation

## 2013-07-07 DIAGNOSIS — S1093XA Contusion of unspecified part of neck, initial encounter: Secondary | ICD-10-CM

## 2013-07-07 DIAGNOSIS — M1990 Unspecified osteoarthritis, unspecified site: Secondary | ICD-10-CM | POA: Diagnosis not present

## 2013-07-07 DIAGNOSIS — Z7982 Long term (current) use of aspirin: Secondary | ICD-10-CM | POA: Diagnosis not present

## 2013-07-07 DIAGNOSIS — IMO0001 Reserved for inherently not codable concepts without codable children: Secondary | ICD-10-CM | POA: Diagnosis not present

## 2013-07-07 DIAGNOSIS — Z8659 Personal history of other mental and behavioral disorders: Secondary | ICD-10-CM | POA: Diagnosis not present

## 2013-07-07 DIAGNOSIS — S0083XA Contusion of other part of head, initial encounter: Secondary | ICD-10-CM

## 2013-07-07 DIAGNOSIS — Z86718 Personal history of other venous thrombosis and embolism: Secondary | ICD-10-CM | POA: Insufficient documentation

## 2013-07-07 DIAGNOSIS — R011 Cardiac murmur, unspecified: Secondary | ICD-10-CM | POA: Diagnosis not present

## 2013-07-07 DIAGNOSIS — Y92009 Unspecified place in unspecified non-institutional (private) residence as the place of occurrence of the external cause: Secondary | ICD-10-CM | POA: Insufficient documentation

## 2013-07-07 DIAGNOSIS — Z91040 Latex allergy status: Secondary | ICD-10-CM | POA: Diagnosis not present

## 2013-07-07 DIAGNOSIS — S62609A Fracture of unspecified phalanx of unspecified finger, initial encounter for closed fracture: Secondary | ICD-10-CM | POA: Diagnosis not present

## 2013-07-07 DIAGNOSIS — W19XXXA Unspecified fall, initial encounter: Secondary | ICD-10-CM

## 2013-07-07 DIAGNOSIS — IMO0002 Reserved for concepts with insufficient information to code with codable children: Secondary | ICD-10-CM | POA: Diagnosis not present

## 2013-07-07 DIAGNOSIS — Z880 Allergy status to penicillin: Secondary | ICD-10-CM | POA: Diagnosis not present

## 2013-07-07 DIAGNOSIS — S0990XA Unspecified injury of head, initial encounter: Secondary | ICD-10-CM | POA: Insufficient documentation

## 2013-07-07 DIAGNOSIS — I209 Angina pectoris, unspecified: Secondary | ICD-10-CM | POA: Diagnosis not present

## 2013-07-07 DIAGNOSIS — E119 Type 2 diabetes mellitus without complications: Secondary | ICD-10-CM | POA: Diagnosis not present

## 2013-07-07 DIAGNOSIS — M069 Rheumatoid arthritis, unspecified: Secondary | ICD-10-CM | POA: Insufficient documentation

## 2013-07-07 DIAGNOSIS — W010XXA Fall on same level from slipping, tripping and stumbling without subsequent striking against object, initial encounter: Secondary | ICD-10-CM | POA: Insufficient documentation

## 2013-07-07 DIAGNOSIS — Z79899 Other long term (current) drug therapy: Secondary | ICD-10-CM | POA: Diagnosis not present

## 2013-07-07 DIAGNOSIS — S0093XA Contusion of unspecified part of head, initial encounter: Secondary | ICD-10-CM

## 2013-07-07 DIAGNOSIS — S62501A Fracture of unspecified phalanx of right thumb, initial encounter for closed fracture: Secondary | ICD-10-CM

## 2013-07-07 DIAGNOSIS — S199XXA Unspecified injury of neck, initial encounter: Secondary | ICD-10-CM | POA: Diagnosis not present

## 2013-07-07 DIAGNOSIS — Z86711 Personal history of pulmonary embolism: Secondary | ICD-10-CM | POA: Insufficient documentation

## 2013-07-07 DIAGNOSIS — Y939 Activity, unspecified: Secondary | ICD-10-CM | POA: Insufficient documentation

## 2013-07-07 DIAGNOSIS — S0993XA Unspecified injury of face, initial encounter: Secondary | ICD-10-CM | POA: Diagnosis not present

## 2013-07-07 MED ORDER — NAPROXEN 250 MG PO TABS
250.0000 mg | ORAL_TABLET | Freq: Once | ORAL | Status: AC
  Administered 2013-07-07: 250 mg via ORAL
  Filled 2013-07-07: qty 1

## 2013-07-07 NOTE — ED Notes (Signed)
MD Wofford at bedside.  

## 2013-07-07 NOTE — ED Provider Notes (Signed)
CSN: [id number]     Arrival date & time 07/07/13  1811 History  This chart was scribed for Houston Siren III, * by Einar Pheasant, ED Scribe. This patient was seen in room MH12/MH12 and the patient's care was started at 6:55 PM.    Chief Complaint  Patient presents with  . Fall    Patient is a 79 y.o. female presenting with fall. The history is provided by the patient. No language interpreter was used.  Fall This is a new problem. The current episode started 1 to 2 hours ago. The problem has not changed since onset.Associated symptoms include headaches. Pertinent negatives include no chest pain, no abdominal pain and no shortness of breath.   HPI Comments: Diane Abbott is a 79 y.o. female who presents to the Emergency Department complaining of of a fall that occurred 2 hours before arriving at the ED. Pt is currently complaining of a severe headache, and pain in her right thumb.She states that she was walking when she slipped and fell off the sidewalk. Pt states that she hit her head on the concrete. Pt reports that she used to take blood thinners for a history of blood clots, but does not currently. She states that she has been off blood thinners for at least a year. Pt has a history of heart murmur. Denies any SOB, LOC, chest pain, abdominal pain.  Pt states that she's had both of her knees replaced, as well as her left shoulder.   Past Medical History  Diagnosis Date  . Fibromyalgia   . Pulmonary embolism   . RA (rheumatoid arthritis)   . Vitamin B 12 deficiency   . Vitamin D deficiency   . Osteoarthritis   . Osteopenia   . Adrenal insufficiency   . IBS (irritable bowel syndrome)   . History of blood clots 2008    below knee  . Diabetes mellitus type II     pt denies this 03-05-11  . Esophagitis   . Depression   . GERD (gastroesophageal reflux disease)   . Connective tissue disorder   . Cataracts, bilateral     more in left than right  . Osteoarthritis of left shoulder  12/07/2011  . Heart murmur   . Leaky heart valve   . Anginal pain     "comes and goes has occured since 2012"  . Bronchitis Jan 2013-March 2013    severe  . Shortness of breath     comes from aneurysm  . Vertigo     hx of  . Urinary leakage     when coughing  . Sliding hiatal hernia   . Fatty liver    Past Surgical History  Procedure Laterality Date  . Abdominal hysterectomy    . Total knee arthroplasty      bilateral  . Cholecystectomy  2003  . Tonsillectomy  1941  . Appendectomy  1995  . Wrist surgery      bilateral  . Bilateral oophorectomy    . Tummy tuck    . Cosmetic surgery      Tummy tuck  . Total shoulder arthroplasty  12/07/2011    Procedure: TOTAL SHOULDER ARTHROPLASTY;  Surgeon: Johnny Bridge, MD;  Location: [facility];  Service: Orthopedics;  Laterality: Left;  left total shoulder artthroplasty  . Joint replacement Bilateral   . Cardiac catheterization  09/15/12    no PCI  . Colonoscopy    . Breast biopsy Left   . Skin cancer excision  Right     cheek  . Eye surgery Bilateral     Cataract  . Bentall procedure N/A 09/29/2012    Procedure: BENTALL PROCEDURE;  Surgeon: Gaye Pollack, MD;  Location: [facility];  Service: Open Heart Surgery;  Laterality: N/A;  WITH CIRC ARREST  . Intraoperative transesophageal echocardiogram N/A 09/29/2012    Procedure: INTRAOPERATIVE TRANSESOPHAGEAL ECHOCARDIOGRAM;  Surgeon: Gaye Pollack, MD;  Location: [facility] OR;  Service: Open Heart Surgery;  Laterality: N/A;  . Aorta surgery     Family History  Problem Relation Age of Onset  . Ovarian cancer Mother   . Heart disease Maternal Grandmother   . Clotting disorder    . Breast cancer    . Diabetes Mother   . Pancreatic cancer    . COPD Father   . Colon cancer Neg Hx    History  Substance Use Topics  . Smoking status: Never Smoker   . Smokeless tobacco: Never Used  . Alcohol Use: Yes     Comment: occassional   OB History   Grav Para Term Preterm Abortions TAB SAB Ect Mult Living                  Review of Systems  Respiratory: Negative for shortness of breath.   Cardiovascular: Negative for chest pain.  Gastrointestinal: Negative for abdominal pain.  Neurological: Positive for headaches.  All other systems reviewed and are negative.      Allergies  Codeine; Pramipexole dihydrochloride; Rofecoxib; Arava; Clonazepam; Diclofenac sodium; Venlafaxine; Alprazolam; Benzodiazepines; Darvocet; Duloxetine; Gabapentin; Latex; Morphine and related; Nortriptyline hcl; Penicillins; and Prednisone  Home Medications   Current Outpatient Rx  Name  Route  Sig  Dispense  Refill  . aspirin EC 81 MG EC tablet   Oral   Take 1 tablet (81 mg total) by mouth daily.         . Estradiol 10 MCG TABS vaginal tablet   Vaginal   Place 1 tablet (10 mcg total) vaginally every other day.   15 tablet   5   . fluconazole (DIFLUCAN) 100 MG tablet   Oral   Take 100 mg by mouth as directed.         . lidocaine (LIDODERM) 5 %   Transdermal   Place 3 patches onto the skin daily as needed. Remove & Discard patch within 12 hours or as directed by MD         . NEXIUM 40 MG capsule      TAKE ONE CAPSULE BY MOUTH TWICE DAILY   60 capsule   0   . OxyCODONE (OXYCONTIN) 10 mg T12A 12 hr tablet   Oral   Take 1 tablet (10 mg total) by mouth every 12 (twelve) hours. Please fill on or after 06/26/13   60 tablet   0   . Probiotic Product (PROBIOTIC DAILY PO)   Oral   Take by mouth daily.         . ramelteon (ROZEREM) 8 MG tablet   Oral   Take 1 tablet (8 mg total) by mouth at bedtime.   30 tablet   5   . rOPINIRole (REQUIP) 2 MG tablet      TAKE ONE TABLET BY MOUTH THREE TIMES DAILY   90 tablet   5   . triamcinolone cream (KENALOG) 0.5 %   Topical   Apply 1 application topically 3 (three) times daily.   45 g   1    Triage Vitals: BP  162/82  Pulse 87  Temp(Src) 97.6 F (36.4 C) (Oral)  Resp 20  Ht 5\' 1"  (1.549 m)  Wt 159 lb (72.122 kg)  BMI 30.06 kg/m2   SpO2 99% Physical Exam  Nursing note and vitals reviewed. Constitutional: She is oriented to person, place, and time. She appears well-developed and well-nourished. No distress.  HENT:  Head: Normocephalic and atraumatic. Head is without raccoon's eyes and without Battle's sign.    Nose: Nose normal.  Small abrasion to bridge of nose  Eyes: Conjunctivae and EOM are normal. Pupils are equal, round, and reactive to light. No scleral icterus.  Neck: No spinous process tenderness and no muscular tenderness present.  Cardiovascular: Normal rate, regular rhythm and intact distal pulses.   Murmur heard.  Systolic murmur is present with a grade of 3/6  Pulmonary/Chest: Effort normal and breath sounds normal. She has no rales. She exhibits no tenderness.  Abdominal: Soft. There is no tenderness. There is no rebound and no guarding.  Musculoskeletal: Normal range of motion. She exhibits no edema.       Right wrist: She exhibits tenderness (large contusion over base of thumb with tenderness).       Right knee: She exhibits no swelling, no effusion and no ecchymosis (small abrasion). No tenderness found.       Thoracic back: She exhibits no tenderness and no bony tenderness.       Lumbar back: She exhibits no tenderness and no bony tenderness.  No evidence of trauma to extremities, except as noted.  2+ distal pulses.    Neurological: She is alert and oriented to person, place, and time.  Skin: Skin is warm and dry. No rash noted.  Psychiatric: She has a normal mood and affect.    ED Course  Procedures (including critical care time)  DIAGNOSTIC STUDIES: Oxygen Saturation is 99% on RA, normal by my interpretation.    COORDINATION OF CARE: 7:02 PM- Pt is requesting pain medication. Will order CT scan of pt's head. Pt advised of plan for treatment and pt agrees.  Labs Review Labs Reviewed - No data to display Imaging Review Ct Head Wo Contrast  07/07/2013   CLINICAL DATA:  Traumatic injury  and pain  EXAM: CT HEAD WITHOUT CONTRAST  CT CERVICAL SPINE WITHOUT CONTRAST  TECHNIQUE: Multidetector CT imaging of the head and cervical spine was performed following the standard protocol without intravenous contrast. Multiplanar CT image reconstructions of the cervical spine were also generated.  COMPARISON:  05/30/2005, 08/03/2012  FINDINGS: CT HEAD FINDINGS  The bony calvarium is intact. A mild soft tissue hematoma is noted in the right frontal region in the supraorbital ridge. No findings to suggest acute hemorrhage, acute infarction or space-occupying mass lesion are.  CT CERVICAL SPINE FINDINGS  Seven cervical segments are well visualized. Vertebral body height is well maintained. Disc space narrowing is noted at C4-5, C5-6 and C6-7 with associated osteophytes. Mild anterolisthesis of C3 on C4 is noted of a degenerative nature. Multilevel facet hypertrophic changes are seen. No acute fracture or acute facet abnormality is noted. The surrounding soft tissues are within normal limits. The visualized lung apices are unremarkable. There is mild prominence of the ascending aortic arch. This appears stable from a prior exam.  IMPRESSION: CT head: Soft tissue hematoma without acute intracranial abnormality.  CT of the cervical spine: Multilevel degenerative change without acute abnormality.   Electronically Signed   By: Inez Catalina M.D.   On: 07/07/2013 19:41   Ct Cervical  Spine Wo Contrast  07/07/2013   CLINICAL DATA:  Traumatic injury and pain  EXAM: CT HEAD WITHOUT CONTRAST  CT CERVICAL SPINE WITHOUT CONTRAST  TECHNIQUE: Multidetector CT imaging of the head and cervical spine was performed following the standard protocol without intravenous contrast. Multiplanar CT image reconstructions of the cervical spine were also generated.  COMPARISON:  05/30/2005, 08/03/2012  FINDINGS: CT HEAD FINDINGS  The bony calvarium is intact. A mild soft tissue hematoma is noted in the right frontal region in the supraorbital  ridge. No findings to suggest acute hemorrhage, acute infarction or space-occupying mass lesion are.  CT CERVICAL SPINE FINDINGS  Seven cervical segments are well visualized. Vertebral body height is well maintained. Disc space narrowing is noted at C4-5, C5-6 and C6-7 with associated osteophytes. Mild anterolisthesis of C3 on C4 is noted of a degenerative nature. Multilevel facet hypertrophic changes are seen. No acute fracture or acute facet abnormality is noted. The surrounding soft tissues are within normal limits. The visualized lung apices are unremarkable. There is mild prominence of the ascending aortic arch. This appears stable from a prior exam.  IMPRESSION: CT head: Soft tissue hematoma without acute intracranial abnormality.  CT of the cervical spine: Multilevel degenerative change without acute abnormality.   Electronically Signed   By: Inez Catalina M.D.   On: 07/07/2013 19:41   Dg Hand Complete Right  07/07/2013   CLINICAL DATA:  History of trauma to the right hand complaining of pain and swelling at the base of the thumb.  EXAM: RIGHT HAND - COMPLETE 3+ VIEW  COMPARISON:  Right wrist radiograph 07/10/2006.  FINDINGS: An oblique mildly displaced fracture through the base of the first metacarpal is noted. This appears foreshortened with approximately 2-3 mm of proximal migration of the distal fracture fragment. The fracture line does not appear to extend to the articular surface at the first [facility] joint. No other acute displaced fractures are noted. Advanced multifocal degenerative changes are noted throughout the hand, most pronounced throughout the carpals and at the MCP joints, significantly increased compared to prior study from 07/10/2006. In these regions there is profound loss of joint space, and multifocal erosions. Volar subluxation of the proximal phalanges is noted at the MCP joints, most severe at the second and third MCP joints.  IMPRESSION: 1. Acute oblique mildly displaced fracture  through the base of the first metacarpal. 2. Progressive severe multifocal degenerative changes throughout the hand, favored to be related to an underlying inflammatory arthropathy. Although mineralization is relatively good, the overall imaging features suggest rheumatoid arthritis.   Electronically Signed   By: Vinnie Langton M.D.   On: 07/07/2013 19:34  All radiology studies independently viewed by me.      EKG Interpretation None      MDM   Final diagnoses:  Fall  Head contusion  Closed head injury  Closed fracture of right thumb    79 year old female presenting with a mechanical fall. She stubbed her toe and fell forward onto concrete. She struck her head but did not lose consciousness. Also has some neck tenderness, which she states is chronic. Right thumb has large contusion.  Head and C spine imaging negative.  Right thumb has fracture.  Placed in thumb spica, she will follow up with hand.    I personally performed the services described in this documentation, which was scribed in my presence. The recorded information has been reviewed and is accurate.     Arbie Cookey, MD 07/08/13 [phone]

## 2013-07-07 NOTE — ED Notes (Signed)
Pt tripped/fell on concrete at home approx 430-5pm-hematoma to right forehead, abrasions to face and right forearm-deneisLOC-steady gait to tx area-also reports abrasions to right knee

## 2013-07-10 ENCOUNTER — Encounter (HOSPITAL_COMMUNITY): Payer: Self-pay

## 2013-07-10 DIAGNOSIS — G5600 Carpal tunnel syndrome, unspecified upper limb: Secondary | ICD-10-CM | POA: Diagnosis not present

## 2013-07-10 DIAGNOSIS — S62329A Displaced fracture of shaft of unspecified metacarpal bone, initial encounter for closed fracture: Secondary | ICD-10-CM | POA: Diagnosis not present

## 2013-07-11 DIAGNOSIS — Z79899 Other long term (current) drug therapy: Secondary | ICD-10-CM | POA: Diagnosis not present

## 2013-07-11 DIAGNOSIS — M069 Rheumatoid arthritis, unspecified: Secondary | ICD-10-CM | POA: Diagnosis not present

## 2013-07-11 DIAGNOSIS — M2550 Pain in unspecified joint: Secondary | ICD-10-CM | POA: Diagnosis not present

## 2013-07-11 DIAGNOSIS — IMO0001 Reserved for inherently not codable concepts without codable children: Secondary | ICD-10-CM | POA: Diagnosis not present

## 2013-07-12 ENCOUNTER — Encounter (HOSPITAL_COMMUNITY): Payer: Self-pay

## 2013-07-12 ENCOUNTER — Other Ambulatory Visit: Payer: Medicare Other

## 2013-07-12 DIAGNOSIS — Y929 Unspecified place or not applicable: Secondary | ICD-10-CM | POA: Diagnosis not present

## 2013-07-12 DIAGNOSIS — G8918 Other acute postprocedural pain: Secondary | ICD-10-CM | POA: Diagnosis not present

## 2013-07-12 DIAGNOSIS — Y999 Unspecified external cause status: Secondary | ICD-10-CM | POA: Diagnosis not present

## 2013-07-12 DIAGNOSIS — G5600 Carpal tunnel syndrome, unspecified upper limb: Secondary | ICD-10-CM | POA: Diagnosis not present

## 2013-07-12 DIAGNOSIS — S62233A Other displaced fracture of base of first metacarpal bone, unspecified hand, initial encounter for closed fracture: Secondary | ICD-10-CM | POA: Diagnosis not present

## 2013-07-12 DIAGNOSIS — Y939 Activity, unspecified: Secondary | ICD-10-CM | POA: Diagnosis not present

## 2013-07-12 DIAGNOSIS — X58XXXA Exposure to other specified factors, initial encounter: Secondary | ICD-10-CM | POA: Diagnosis not present

## 2013-07-14 ENCOUNTER — Encounter (HOSPITAL_COMMUNITY): Payer: Self-pay

## 2013-07-17 ENCOUNTER — Encounter (HOSPITAL_COMMUNITY): Payer: Self-pay

## 2013-07-18 DIAGNOSIS — S62329A Displaced fracture of shaft of unspecified metacarpal bone, initial encounter for closed fracture: Secondary | ICD-10-CM | POA: Diagnosis not present

## 2013-07-19 ENCOUNTER — Encounter (HOSPITAL_COMMUNITY): Payer: Self-pay

## 2013-07-20 ENCOUNTER — Other Ambulatory Visit: Payer: Medicare Other

## 2013-07-21 ENCOUNTER — Encounter (HOSPITAL_COMMUNITY): Payer: Self-pay

## 2013-07-24 ENCOUNTER — Encounter (HOSPITAL_COMMUNITY): Payer: Self-pay

## 2013-07-24 ENCOUNTER — Ambulatory Visit: Payer: Medicare Other | Admitting: Internal Medicine

## 2013-07-25 ENCOUNTER — Ambulatory Visit (INDEPENDENT_AMBULATORY_CARE_PROVIDER_SITE_OTHER): Payer: Medicare Other | Admitting: Internal Medicine

## 2013-07-25 ENCOUNTER — Encounter: Payer: Self-pay | Admitting: Internal Medicine

## 2013-07-25 VITALS — BP 120/62 | HR 80 | Temp 97.8°F | Resp 16 | Wt 162.0 lb

## 2013-07-25 DIAGNOSIS — IMO0001 Reserved for inherently not codable concepts without codable children: Secondary | ICD-10-CM | POA: Diagnosis not present

## 2013-07-25 DIAGNOSIS — M25561 Pain in right knee: Secondary | ICD-10-CM

## 2013-07-25 DIAGNOSIS — E538 Deficiency of other specified B group vitamins: Secondary | ICD-10-CM | POA: Diagnosis not present

## 2013-07-25 DIAGNOSIS — M25569 Pain in unspecified knee: Secondary | ICD-10-CM

## 2013-07-25 DIAGNOSIS — M25562 Pain in left knee: Secondary | ICD-10-CM | POA: Insufficient documentation

## 2013-07-25 MED ORDER — OXYCODONE HCL ER 10 MG PO T12A: 10.0000 mg | EXTENDED_RELEASE_TABLET | Freq: Two times a day (BID) | ORAL | Status: DC

## 2013-07-25 NOTE — Assessment & Plan Note (Signed)
Continue with current prescription therapy as reflected on the Med list.  

## 2013-07-25 NOTE — Progress Notes (Signed)
Pre visit review using our clinic review tool, if applicable. No additional management support is needed unless otherwise documented below in the visit note. 

## 2013-07-25 NOTE — Progress Notes (Signed)
Subjective:      HPI  F/u a fall on 07/07/13 he broke R thumb; bruise over R eye and R knee. She had a surgery on R thumb fx. No LOC  F/u  insomnia F/u vag dryness and pt would like to re-start Vagifem pv qod as before (per her GYN)  F/u R CTS  and neck pain - Dr Joya Salm is planning to do surgeries: she had a myelogram   S/p thor AA and AR recently repaired. AVR is repaired Dr Cyndia Bent - she is c/o scar area irritation/pain  S/p L shoulder replacement 12/07/11 by Dr Mardelle Matte. F/u pain in L arm - better   The patient is here to follow up on chronic depression, anxiety, headaches and chronic moderate fibromyalgia symptoms controlled with medicines, stable. She is on Sympony for RA q 2 mo  Hematol said - no need for anticoagulation  Wt Readings from Last 3 Encounters:  07/25/13 162 lb (73.483 kg)  07/07/13 159 lb (72.122 kg)  05/26/13 156 lb (70.761 kg)   BP Readings from Last 3 Encounters:  07/25/13 120/62  07/07/13 148/81  05/26/13 126/70   Past Medical History  Diagnosis Date  . Fibromyalgia   . Pulmonary embolism   . RA (rheumatoid arthritis)   . Vitamin B 12 deficiency   . Vitamin D deficiency   . Osteoarthritis   . Osteopenia   . Adrenal insufficiency   . IBS (irritable bowel syndrome)   . History of blood clots 2008    below knee  . Diabetes mellitus type II     pt denies this 03-05-11  . Esophagitis   . Depression   . GERD (gastroesophageal reflux disease)   . Connective tissue disorder   . Cataracts, bilateral     more in left than right  . Osteoarthritis of left shoulder 12/07/2011  . Heart murmur   . Leaky heart valve   . Anginal pain     "comes and goes has occured since 2012"  . Bronchitis Jan 2013-March 2013    severe  . Shortness of breath     comes from aneurysm  . Vertigo     hx of  . Urinary leakage     when coughing  . Sliding hiatal hernia   . Fatty liver    Past Surgical History  Procedure Laterality Date  . Abdominal hysterectomy    .  Total knee arthroplasty      bilateral  . Cholecystectomy  2003  . Tonsillectomy  1941  . Appendectomy  1995  . Wrist surgery      bilateral  . Bilateral oophorectomy    . Tummy tuck    . Cosmetic surgery      Tummy tuck  . Total shoulder arthroplasty  12/07/2011    Procedure: TOTAL SHOULDER ARTHROPLASTY;  Surgeon: Johnny Bridge, MD;  Location: [facility];  Service: Orthopedics;  Laterality: Left;  left total shoulder artthroplasty  . Joint replacement Bilateral   . Cardiac catheterization  09/15/12    no PCI  . Colonoscopy    . Breast biopsy Left   . Skin cancer excision Right     cheek  . Eye surgery Bilateral     Cataract  . Bentall procedure N/A 09/29/2012    Procedure: BENTALL PROCEDURE;  Surgeon: Gaye Pollack, MD;  Location: [place];  Service: Open Heart Surgery;  Laterality: N/A;  WITH CIRC ARREST  . Intraoperative transesophageal echocardiogram N/A 09/29/2012    Procedure:  INTRAOPERATIVE TRANSESOPHAGEAL ECHOCARDIOGRAM;  Surgeon: Gaye Pollack, MD;  Location: [facility] OR;  Service: Open Heart Surgery;  Laterality: N/A;  . Aorta surgery      reports that she has never smoked. She has never used smokeless tobacco. She reports that she drinks alcohol. She reports that she does not use illicit drugs. family history includes Breast cancer in an other family member; COPD in her father; Clotting disorder in an other family member; Diabetes in her mother; Heart disease in her maternal grandmother; Ovarian cancer in her mother; Pancreatic cancer in an other family member. There is no history of Colon cancer. Allergies  Allergen Reactions  . Codeine Other (See Comments)    Blood pressure drops; "I pass out."  . Pramipexole Dihydrochloride Nausea Only    sick, falling  . Rofecoxib Other (See Comments)    "sleep walks"  . Arava [Leflunomide] Swelling  . Clonazepam   . Diclofenac Sodium   . Venlafaxine   . Alprazolam Other (See Comments)    "Makes me mean"  . Benzodiazepines Other (See  Comments)    "Halllucinations?"  . Darvocet [Propoxyphene N-Acetaminophen] Other (See Comments)    "makes my eyes dilate. Pt stated I can't see"  . Duloxetine Other (See Comments)    achy legs  . Gabapentin Other (See Comments)    headache  . Latex Itching and Dermatitis    When examined with latex gloves, burns and itches in contact areas per pt.  . Morphine And Related Other (See Comments)    Hallucinations   . Nortriptyline Hcl Other (See Comments)    Migraines   . Penicillins Rash    Can take Cephalosporins  . Prednisone Swelling    Just doesn't want to take it.   Current Outpatient Prescriptions on File Prior to Visit  Medication Sig Dispense Refill  . aspirin EC 81 MG EC tablet Take 1 tablet (81 mg total) by mouth daily.      . Estradiol 10 MCG TABS vaginal tablet Place 1 tablet (10 mcg total) vaginally every other day.  15 tablet  5  . fluconazole (DIFLUCAN) 100 MG tablet Take 100 mg by mouth as directed.      . lidocaine (LIDODERM) 5 % Place 3 patches onto the skin daily as needed. Remove & Discard patch within 12 hours or as directed by MD      . NEXIUM 40 MG capsule TAKE ONE CAPSULE BY MOUTH TWICE DAILY  60 capsule  0  . OxyCODONE (OXYCONTIN) 10 mg T12A 12 hr tablet Take 1 tablet (10 mg total) by mouth every 12 (twelve) hours. Please fill on or after 06/26/13  60 tablet  0  . Probiotic Product (PROBIOTIC DAILY PO) Take by mouth daily.      . ramelteon (ROZEREM) 8 MG tablet Take 1 tablet (8 mg total) by mouth at bedtime.  30 tablet  5  . rOPINIRole (REQUIP) 2 MG tablet TAKE ONE TABLET BY MOUTH THREE TIMES DAILY  90 tablet  5  . triamcinolone cream (KENALOG) 0.5 % Apply 1 application topically 3 (three) times daily.  45 g  1   No current facility-administered medications on file prior to visit.      Review of Systems  Constitutional: Negative for activity change, appetite change, fatigue and unexpected weight change.  HENT: Positive for sinus pressure. Negative for  congestion and mouth sores.   Eyes: Negative for visual disturbance.  Respiratory: Negative for chest tightness.   Gastrointestinal: Negative for nausea  and abdominal pain.  Genitourinary: Negative for frequency, difficulty urinating and vaginal pain.  Musculoskeletal: Positive for arthralgias and back pain. Negative for gait problem.  Skin: Negative for pallor.  Neurological: Negative for dizziness, tremors, weakness and numbness.  Psychiatric/Behavioral: Negative for confusion and sleep disturbance.   BP 120/62  Pulse 80  Temp(Src) 97.8 F (36.6 C) (Oral)  Resp 16  Wt 162 lb (73.483 kg)     Objective:   Physical Exam  Constitutional: She appears well-developed and well-nourished. No distress.  HENT:  Head: Normocephalic.  Right Ear: External ear normal.  Left Ear: External ear normal.  Nose: Nose normal.  Mouth/Throat: Oropharynx is clear and moist.  Eyes: Conjunctivae are normal. Pupils are equal, round, and reactive to light. Right eye exhibits no discharge. Left eye exhibits no discharge.  Neck: Normal range of motion. Neck supple. No JVD present. No tracheal deviation present. No thyromegaly present.  Cardiovascular: Normal rate, regular rhythm and normal heart sounds.   Pulmonary/Chest: No stridor. No respiratory distress. She has no wheezes.  Abdominal: Soft. Bowel sounds are normal. She exhibits no distension and no mass. There is no tenderness. There is no rebound and no guarding.  Musculoskeletal: She exhibits tenderness. She exhibits no edema.  L shoulder is not tender w/ROM Neck is tender w/ROM  Lymphadenopathy:    She has no cervical adenopathy.  Neurological: She displays normal reflexes. No cranial nerve deficit. She exhibits normal muscle tone. Coordination normal.  Skin: No rash noted. No erythema.  Psychiatric: She has a normal mood and affect. Her behavior is normal. Judgment and thought content normal.  sad  keloid looking scar, tender - chest R  wrist/forearm is in a splint R eye is bruised R knee is bruised Lab Results  Component Value Date   WBC 4.3* 01/27/2013   HGB 10.9* 01/27/2013   HCT 33.0* 01/27/2013   PLT 137.0 Repeated and verified X2.* 01/27/2013   GLUCOSE 95 01/27/2013   ALT 17 01/27/2013   AST 27 01/27/2013   NA 138 01/27/2013   K 4.3 01/27/2013   CL 106 01/27/2013   CREATININE 1.1 01/27/2013   BUN 21 01/27/2013   CO2 25 01/27/2013   TSH 2.88 01/09/2011   INR 1.34 09/29/2012   HGBA1C 5.7* 09/27/2012           Assessment & Plan:

## 2013-07-25 NOTE — Assessment & Plan Note (Signed)
X ray

## 2013-07-26 ENCOUNTER — Encounter (HOSPITAL_COMMUNITY): Payer: Self-pay

## 2013-07-27 DIAGNOSIS — S62329A Displaced fracture of shaft of unspecified metacarpal bone, initial encounter for closed fracture: Secondary | ICD-10-CM | POA: Diagnosis not present

## 2013-07-28 ENCOUNTER — Encounter (HOSPITAL_COMMUNITY): Payer: Self-pay

## 2013-07-31 ENCOUNTER — Encounter (HOSPITAL_COMMUNITY): Payer: Self-pay

## 2013-08-02 ENCOUNTER — Other Ambulatory Visit: Payer: Self-pay | Admitting: Internal Medicine

## 2013-08-02 ENCOUNTER — Encounter (HOSPITAL_COMMUNITY): Payer: Self-pay

## 2013-08-02 DIAGNOSIS — M069 Rheumatoid arthritis, unspecified: Secondary | ICD-10-CM | POA: Diagnosis not present

## 2013-08-02 DIAGNOSIS — Z79899 Other long term (current) drug therapy: Secondary | ICD-10-CM | POA: Diagnosis not present

## 2013-08-04 ENCOUNTER — Encounter (HOSPITAL_COMMUNITY): Payer: Self-pay

## 2013-08-07 ENCOUNTER — Encounter (HOSPITAL_COMMUNITY): Payer: Self-pay

## 2013-08-09 ENCOUNTER — Encounter (HOSPITAL_COMMUNITY): Payer: Self-pay

## 2013-08-11 ENCOUNTER — Encounter (HOSPITAL_COMMUNITY): Payer: Self-pay

## 2013-08-14 ENCOUNTER — Encounter (HOSPITAL_COMMUNITY): Payer: Self-pay

## 2013-08-15 ENCOUNTER — Ambulatory Visit (HOSPITAL_COMMUNITY)
Admission: RE | Admit: 2013-08-15 | Discharge: 2013-08-15 | Disposition: A | Discharge status: Home / Self Care | Payer: Medicare Other | Source: Ambulatory Visit | Attending: Internal Medicine | Admitting: Internal Medicine

## 2013-08-15 DIAGNOSIS — M25561 Pain in right knee: Secondary | ICD-10-CM

## 2013-08-15 DIAGNOSIS — R29898 Other symptoms and signs involving the musculoskeletal system: Secondary | ICD-10-CM | POA: Diagnosis not present

## 2013-08-15 DIAGNOSIS — W19XXXA Unspecified fall, initial encounter: Secondary | ICD-10-CM | POA: Insufficient documentation

## 2013-08-15 DIAGNOSIS — M25569 Pain in unspecified knee: Secondary | ICD-10-CM | POA: Insufficient documentation

## 2013-08-16 ENCOUNTER — Encounter (HOSPITAL_COMMUNITY): Payer: Self-pay

## 2013-08-18 ENCOUNTER — Encounter (HOSPITAL_COMMUNITY): Payer: Self-pay

## 2013-08-21 ENCOUNTER — Encounter (HOSPITAL_COMMUNITY): Payer: Self-pay

## 2013-08-23 ENCOUNTER — Encounter (HOSPITAL_COMMUNITY): Payer: Self-pay

## 2013-08-25 ENCOUNTER — Encounter (HOSPITAL_COMMUNITY): Payer: Self-pay

## 2013-08-28 ENCOUNTER — Encounter (HOSPITAL_COMMUNITY): Payer: Self-pay

## 2013-08-29 DIAGNOSIS — S62329A Displaced fracture of shaft of unspecified metacarpal bone, initial encounter for closed fracture: Secondary | ICD-10-CM | POA: Diagnosis not present

## 2013-08-30 ENCOUNTER — Other Ambulatory Visit: Payer: Self-pay

## 2013-08-30 ENCOUNTER — Encounter (HOSPITAL_COMMUNITY): Payer: Self-pay

## 2013-08-30 NOTE — Telephone Encounter (Signed)
The patient called and stated she was going to the dentist soon.  She stated she was told to take an antibiotic each time for she visits her dentist due to her complicated heart history.   Callback - [phone]

## 2013-08-31 MED ORDER — AZITHROMYCIN 250 MG PO TABS: ORAL_TABLET | ORAL | Status: DC

## 2013-08-31 NOTE — Telephone Encounter (Signed)
Ok Zithromax Thx

## 2013-08-31 NOTE — Telephone Encounter (Signed)
Pt informed. She is requesting Rx for Diflucan. Please advise. See pended meds.   Also, She wants to know why she has a small knot on her Right patella. Please advise.

## 2013-09-01 ENCOUNTER — Encounter (HOSPITAL_COMMUNITY): Payer: Self-pay

## 2013-09-04 ENCOUNTER — Encounter (HOSPITAL_COMMUNITY): Payer: Self-pay

## 2013-09-06 ENCOUNTER — Encounter (HOSPITAL_COMMUNITY): Payer: Self-pay

## 2013-09-08 ENCOUNTER — Encounter (HOSPITAL_COMMUNITY): Payer: Self-pay

## 2013-09-08 MED ORDER — FLUCONAZOLE 100 MG PO TABS: 100.0000 mg | ORAL_TABLET | ORAL | Status: DC

## 2013-09-11 ENCOUNTER — Encounter (HOSPITAL_COMMUNITY): Payer: Self-pay

## 2013-09-13 ENCOUNTER — Encounter (HOSPITAL_COMMUNITY): Payer: Self-pay

## 2013-09-15 ENCOUNTER — Encounter (HOSPITAL_COMMUNITY): Payer: Self-pay

## 2013-09-18 ENCOUNTER — Encounter (HOSPITAL_COMMUNITY): Payer: Self-pay

## 2013-09-20 ENCOUNTER — Encounter (HOSPITAL_COMMUNITY): Payer: Self-pay

## 2013-09-21 ENCOUNTER — Other Ambulatory Visit: Payer: Self-pay | Admitting: Internal Medicine

## 2013-09-22 ENCOUNTER — Encounter: Payer: Self-pay | Admitting: Internal Medicine

## 2013-09-22 ENCOUNTER — Encounter (HOSPITAL_COMMUNITY): Payer: Self-pay

## 2013-09-22 ENCOUNTER — Ambulatory Visit (INDEPENDENT_AMBULATORY_CARE_PROVIDER_SITE_OTHER): Payer: Medicare Other | Admitting: Internal Medicine

## 2013-09-22 VITALS — BP 146/82 | HR 80 | Temp 97.4°F | Resp 16 | Wt 163.0 lb

## 2013-09-22 DIAGNOSIS — L299 Pruritus, unspecified: Secondary | ICD-10-CM

## 2013-09-22 DIAGNOSIS — E538 Deficiency of other specified B group vitamins: Secondary | ICD-10-CM

## 2013-09-22 DIAGNOSIS — IMO0001 Reserved for inherently not codable concepts without codable children: Secondary | ICD-10-CM | POA: Diagnosis not present

## 2013-09-22 MED ORDER — OXYCODONE HCL ER 10 MG PO T12A: 10.0000 mg | EXTENDED_RELEASE_TABLET | Freq: Two times a day (BID) | ORAL | Status: DC

## 2013-09-22 NOTE — Progress Notes (Signed)
Patient ID: Diane Abbott, female   DOB: Sep 17, 1934, 79 y.o.   MRN: [id number]   Subjective:      HPI  F/u a fall on 07/07/13 he broke R thumb; bruise over R eye and R knee. She had a surgery on R thumb fx. No LOC  F/u  insomnia F/u vag dryness and pt would like to re-start Vagifem pv qod as before (per her GYN)  F/u R CTS  and neck pain - Dr Joya Salm is planning to do surgeries: she had a myelogram   S/p thor AA and AR recently repaired. AVR is repaired Dr Cyndia Bent - she is c/o scar area irritation/pain  S/p L shoulder replacement 12/07/11 by Dr Mardelle Matte. F/u pain in L arm - better   The patient is here to follow up on chronic depression, anxiety, headaches and chronic moderate fibromyalgia symptoms controlled with medicines, stable. She is on Sympony for RA q 2 mo  Hematol said - no need for anticoagulation  Wt Readings from Last 3 Encounters:  09/22/13 163 lb (73.936 kg)  07/25/13 162 lb (73.483 kg)  07/07/13 159 lb (72.122 kg)   BP Readings from Last 3 Encounters:  09/22/13 146/82  07/25/13 120/62  07/07/13 148/81   Past Medical History  Diagnosis Date  . Fibromyalgia   . Pulmonary embolism   . RA (rheumatoid arthritis)   . Vitamin B 12 deficiency   . Vitamin D deficiency   . Osteoarthritis   . Osteopenia   . Adrenal insufficiency   . IBS (irritable bowel syndrome)   . History of blood clots 2008    below knee  . Diabetes mellitus type II     pt denies this 03-05-11  . Esophagitis   . Depression   . GERD (gastroesophageal reflux disease)   . Connective tissue disorder   . Cataracts, bilateral     more in left than right  . Osteoarthritis of left shoulder 12/07/2011  . Heart murmur   . Leaky heart valve   . Anginal pain     "comes and goes has occured since 2012"  . Bronchitis Jan 2013-March 2013    severe  . Shortness of breath     comes from aneurysm  . Vertigo     hx of  . Urinary leakage     when coughing  . Sliding hiatal hernia   . Fatty liver     Past Surgical History  Procedure Laterality Date  . Abdominal hysterectomy    . Total knee arthroplasty      bilateral  . Cholecystectomy  2003  . Tonsillectomy  1941  . Appendectomy  1995  . Wrist surgery      bilateral  . Bilateral oophorectomy    . Tummy tuck    . Cosmetic surgery      Tummy tuck  . Total shoulder arthroplasty  12/07/2011    Procedure: TOTAL SHOULDER ARTHROPLASTY;  Surgeon: Johnny Bridge, MD;  Location: [facility];  Service: Orthopedics;  Laterality: Left;  left total shoulder artthroplasty  . Joint replacement Bilateral   . Cardiac catheterization  09/15/12    no PCI  . Colonoscopy    . Breast biopsy Left   . Skin cancer excision Right     cheek  . Eye surgery Bilateral     Cataract  . Bentall procedure N/A 09/29/2012    Procedure: BENTALL PROCEDURE;  Surgeon: Gaye Pollack, MD;  Location: [facility];  Service: Open Heart Surgery;  Laterality: N/A;  WITH CIRC ARREST  . Intraoperative transesophageal echocardiogram N/A 09/29/2012    Procedure: INTRAOPERATIVE TRANSESOPHAGEAL ECHOCARDIOGRAM;  Surgeon: Gaye Pollack, MD;  Location: [facility] OR;  Service: Open Heart Surgery;  Laterality: N/A;  . Aorta surgery      reports that she has never smoked. She has never used smokeless tobacco. She reports that she drinks alcohol. She reports that she does not use illicit drugs. family history includes Breast cancer in an other family member; COPD in her father; Clotting disorder in an other family member; Diabetes in her mother; Heart disease in her maternal grandmother; Ovarian cancer in her mother; Pancreatic cancer in an other family member. There is no history of Colon cancer. Allergies  Allergen Reactions  . Codeine Other (See Comments)    Blood pressure drops; "I pass out."  . Pramipexole Dihydrochloride Nausea Only    sick, falling  . Rofecoxib Other (See Comments)    "sleep walks"  . Arava [Leflunomide] Swelling  . Clonazepam   . Diclofenac Sodium   . Venlafaxine   .  Alprazolam Other (See Comments)    "Makes me mean"  . Benzodiazepines Other (See Comments)    "Halllucinations?"  . Darvocet [Propoxyphene N-Acetaminophen] Other (See Comments)    "makes my eyes dilate. Pt stated I can't see"  . Duloxetine Other (See Comments)    achy legs  . Gabapentin Other (See Comments)    headache  . Latex Itching and Dermatitis    When examined with latex gloves, burns and itches in contact areas per pt.  . Morphine And Related Other (See Comments)    Hallucinations   . Nortriptyline Hcl Other (See Comments)    Migraines   . Penicillins Rash    Can take Cephalosporins  . Prednisone Swelling    Just doesn't want to take it.   Current Outpatient Prescriptions on File Prior to Visit  Medication Sig Dispense Refill  . aspirin EC 81 MG EC tablet Take 1 tablet (81 mg total) by mouth daily.      Marland Kitchen azithromycin (ZITHROMAX) 250 MG tablet Take 2 tabs 1 hr prior to dental work prn  12 tablet  0  . Estradiol 10 MCG TABS vaginal tablet Place 1 tablet (10 mcg total) vaginally every other day.  15 tablet  5  . loratadine (CLARITIN) 10 MG tablet Take 10 mg by mouth daily.      Marland Kitchen NEXIUM 40 MG capsule TAKE ONE CAPSULE BY MOUTH TWICE DAILY  60 capsule  0  . OxyCODONE (OXYCONTIN) 10 mg T12A 12 hr tablet Take 1 tablet (10 mg total) by mouth every 12 (twelve) hours. Please fill on or after 08/25/13  60 tablet  0  . rOPINIRole (REQUIP) 2 MG tablet TAKE ONE TABLET BY MOUTH THREE TIMES DAILY  90 tablet  0  . triamcinolone cream (KENALOG) 0.5 % Apply 1 application topically 3 (three) times daily.  45 g  1  . fluconazole (DIFLUCAN) 100 MG tablet Take 1 tablet (100 mg total) by mouth as directed.  11 tablet  0  . lidocaine (LIDODERM) 5 % Place 3 patches onto the skin daily as needed. Remove & Discard patch within 12 hours or as directed by MD      . LYRICA 25 MG capsule TAKE ONE CAPSULE BY MOUTH THREE TIMES DAILY  30 capsule  5  . Probiotic Product (PROBIOTIC DAILY PO) Take by mouth  daily.      . ramelteon (ROZEREM) 8 MG  tablet Take 1 tablet (8 mg total) by mouth at bedtime.  30 tablet  5   No current facility-administered medications on file prior to visit.      Review of Systems  Constitutional: Negative for activity change, appetite change, fatigue and unexpected weight change.  HENT: Positive for sinus pressure. Negative for congestion and mouth sores.   Eyes: Negative for visual disturbance.  Respiratory: Negative for chest tightness.   Gastrointestinal: Negative for nausea and abdominal pain.  Genitourinary: Negative for frequency, difficulty urinating and vaginal pain.  Musculoskeletal: Positive for arthralgias and back pain. Negative for gait problem.  Skin: Negative for pallor.  Neurological: Negative for dizziness, tremors, weakness and numbness.  Psychiatric/Behavioral: Negative for confusion and sleep disturbance.   BP 146/82  Pulse 80  Temp(Src) 97.4 F (36.3 C) (Oral)  Resp 16  Wt 163 lb (73.936 kg)     Objective:   Physical Exam  Constitutional: She appears well-developed and well-nourished. No distress.  HENT:  Head: Normocephalic.  Right Ear: External ear normal.  Left Ear: External ear normal.  Nose: Nose normal.  Mouth/Throat: Oropharynx is clear and moist.  Eyes: Conjunctivae are normal. Pupils are equal, round, and reactive to light. Right eye exhibits no discharge. Left eye exhibits no discharge.  Neck: Normal range of motion. Neck supple. No JVD present. No tracheal deviation present. No thyromegaly present.  Cardiovascular: Normal rate, regular rhythm and normal heart sounds.   Pulmonary/Chest: No stridor. No respiratory distress. She has no wheezes.  Abdominal: Soft. Bowel sounds are normal. She exhibits no distension and no mass. There is no tenderness. There is no rebound and no guarding.  Musculoskeletal: She exhibits tenderness. She exhibits no edema.  L shoulder is not tender w/ROM Neck is tender w/ROM  Lymphadenopathy:     She has no cervical adenopathy.  Neurological: She displays normal reflexes. No cranial nerve deficit. She exhibits normal muscle tone. Coordination normal.  Skin: No rash noted. No erythema.  Psychiatric: She has a normal mood and affect. Her behavior is normal. Judgment and thought content normal.  sad  keloid looking scar, tender - chest R wrist/forearm is in a splint R eye is bruised R knee is bruised Lab Results  Component Value Date   WBC 4.3* 01/27/2013   HGB 10.9* 01/27/2013   HCT 33.0* 01/27/2013   PLT 137.0 Repeated and verified X2.* 01/27/2013   GLUCOSE 95 01/27/2013   ALT 17 01/27/2013   AST 27 01/27/2013   NA 138 01/27/2013   K 4.3 01/27/2013   CL 106 01/27/2013   CREATININE 1.1 01/27/2013   BUN 21 01/27/2013   CO2 25 01/27/2013   TSH 2.88 01/09/2011   INR 1.34 09/29/2012   HGBA1C 5.7* 09/27/2012           Assessment & Plan:

## 2013-09-22 NOTE — Assessment & Plan Note (Signed)
5/15 a few bumps on the skin - ?bug bites Rx steroid cream

## 2013-09-22 NOTE — Progress Notes (Signed)
Pre visit review using our clinic review tool, if applicable. No additional management support is needed unless otherwise documented below in the visit note. 

## 2013-09-22 NOTE — Assessment & Plan Note (Signed)
Continue with current prescription therapy as reflected on the Med list.  

## 2013-09-26 DIAGNOSIS — Z79899 Other long term (current) drug therapy: Secondary | ICD-10-CM | POA: Diagnosis not present

## 2013-09-27 ENCOUNTER — Encounter (HOSPITAL_COMMUNITY): Payer: Self-pay

## 2013-09-29 ENCOUNTER — Encounter (HOSPITAL_COMMUNITY): Payer: Self-pay

## 2013-10-02 ENCOUNTER — Encounter (HOSPITAL_COMMUNITY): Payer: Self-pay

## 2013-10-04 ENCOUNTER — Encounter (HOSPITAL_COMMUNITY): Payer: Self-pay

## 2013-10-06 ENCOUNTER — Encounter (HOSPITAL_COMMUNITY): Payer: Self-pay

## 2013-10-09 ENCOUNTER — Encounter (HOSPITAL_COMMUNITY): Payer: Self-pay

## 2013-10-11 ENCOUNTER — Encounter (HOSPITAL_COMMUNITY): Payer: Self-pay

## 2013-10-11 DIAGNOSIS — M2550 Pain in unspecified joint: Secondary | ICD-10-CM | POA: Diagnosis not present

## 2013-10-11 DIAGNOSIS — Z79899 Other long term (current) drug therapy: Secondary | ICD-10-CM | POA: Diagnosis not present

## 2013-10-13 ENCOUNTER — Encounter (HOSPITAL_COMMUNITY): Payer: Self-pay

## 2013-10-13 DIAGNOSIS — IMO0001 Reserved for inherently not codable concepts without codable children: Secondary | ICD-10-CM | POA: Diagnosis not present

## 2013-10-13 DIAGNOSIS — Z79899 Other long term (current) drug therapy: Secondary | ICD-10-CM | POA: Diagnosis not present

## 2013-10-13 DIAGNOSIS — M069 Rheumatoid arthritis, unspecified: Secondary | ICD-10-CM | POA: Diagnosis not present

## 2013-10-13 DIAGNOSIS — M25519 Pain in unspecified shoulder: Secondary | ICD-10-CM | POA: Diagnosis not present

## 2013-10-13 DIAGNOSIS — M25539 Pain in unspecified wrist: Secondary | ICD-10-CM | POA: Diagnosis not present

## 2013-10-16 ENCOUNTER — Encounter (HOSPITAL_COMMUNITY): Payer: Self-pay

## 2013-10-16 ENCOUNTER — Telehealth: Payer: Self-pay

## 2013-10-16 NOTE — Telephone Encounter (Signed)
Patient called [facility] requesting a refill on bactroban to take once daily. She states that her and MD have discussed this change. Thanks

## 2013-10-17 MED ORDER — MUPIROCIN CALCIUM 2 % EX CREA: TOPICAL_CREAM | Freq: Every day | CUTANEOUS | Status: DC

## 2013-10-17 NOTE — Telephone Encounter (Signed)
Done

## 2013-10-17 NOTE — Addendum Note (Signed)
Addended by: Cresenciano Lick on: 10/17/2013 02:55 PM   Modules accepted: Orders, Medications

## 2013-10-17 NOTE — Telephone Encounter (Signed)
Left detailed mess informing pt.  

## 2013-10-18 ENCOUNTER — Encounter (HOSPITAL_COMMUNITY): Payer: Self-pay

## 2013-10-20 ENCOUNTER — Encounter (HOSPITAL_COMMUNITY): Payer: Self-pay

## 2013-10-23 ENCOUNTER — Encounter (HOSPITAL_COMMUNITY): Payer: Self-pay

## 2013-10-23 ENCOUNTER — Telehealth: Payer: Self-pay | Admitting: Internal Medicine

## 2013-10-23 MED ORDER — ESTRADIOL 10 MCG VA TABS: ORAL_TABLET | VAGINAL | Status: DC

## 2013-10-23 NOTE — Telephone Encounter (Signed)
Pt called stated that she need refill for Vagifen 10 mg not the bactroban. Please call pt

## 2013-10-23 NOTE — Telephone Encounter (Signed)
Refill sent. Left mess for patient to call back.

## 2013-10-25 ENCOUNTER — Encounter (HOSPITAL_COMMUNITY): Payer: Self-pay

## 2013-10-27 ENCOUNTER — Encounter (HOSPITAL_COMMUNITY): Payer: Self-pay

## 2013-10-30 ENCOUNTER — Encounter (HOSPITAL_COMMUNITY): Payer: Self-pay

## 2013-10-31 ENCOUNTER — Encounter: Payer: Self-pay | Admitting: Internal Medicine

## 2013-11-01 ENCOUNTER — Encounter (HOSPITAL_COMMUNITY): Payer: Self-pay

## 2013-11-01 ENCOUNTER — Other Ambulatory Visit: Payer: Self-pay | Admitting: Internal Medicine

## 2013-11-01 DIAGNOSIS — Z79899 Other long term (current) drug therapy: Secondary | ICD-10-CM | POA: Diagnosis not present

## 2013-11-01 DIAGNOSIS — M069 Rheumatoid arthritis, unspecified: Secondary | ICD-10-CM | POA: Diagnosis not present

## 2013-11-03 ENCOUNTER — Encounter (HOSPITAL_COMMUNITY): Payer: Self-pay

## 2013-11-06 ENCOUNTER — Encounter (HOSPITAL_COMMUNITY): Payer: Self-pay

## 2013-11-08 ENCOUNTER — Encounter (HOSPITAL_COMMUNITY): Payer: Self-pay

## 2013-11-10 ENCOUNTER — Encounter (HOSPITAL_COMMUNITY): Payer: Self-pay

## 2013-11-13 ENCOUNTER — Encounter (HOSPITAL_COMMUNITY): Payer: Self-pay

## 2013-11-15 ENCOUNTER — Encounter (HOSPITAL_COMMUNITY): Payer: Self-pay

## 2013-11-17 ENCOUNTER — Encounter (HOSPITAL_COMMUNITY): Payer: Self-pay

## 2013-11-20 ENCOUNTER — Encounter (HOSPITAL_COMMUNITY): Payer: Self-pay

## 2013-11-22 ENCOUNTER — Encounter (HOSPITAL_COMMUNITY): Payer: Self-pay

## 2013-11-24 ENCOUNTER — Encounter: Payer: Self-pay | Admitting: Internal Medicine

## 2013-11-24 ENCOUNTER — Ambulatory Visit (INDEPENDENT_AMBULATORY_CARE_PROVIDER_SITE_OTHER): Payer: Medicare Other | Admitting: Internal Medicine

## 2013-11-24 ENCOUNTER — Encounter (HOSPITAL_COMMUNITY): Payer: Self-pay

## 2013-11-24 VITALS — BP 130/90 | HR 75 | Temp 97.7°F | Ht 61.0 in | Wt 164.8 lb

## 2013-11-24 DIAGNOSIS — D6959 Other secondary thrombocytopenia: Secondary | ICD-10-CM | POA: Diagnosis not present

## 2013-11-24 DIAGNOSIS — M359 Systemic involvement of connective tissue, unspecified: Secondary | ICD-10-CM | POA: Diagnosis not present

## 2013-11-24 DIAGNOSIS — T50905A Adverse effect of unspecified drugs, medicaments and biological substances, initial encounter: Principal | ICD-10-CM

## 2013-11-24 DIAGNOSIS — T50904A Poisoning by unspecified drugs, medicaments and biological substances, undetermined, initial encounter: Secondary | ICD-10-CM

## 2013-11-24 DIAGNOSIS — M069 Rheumatoid arthritis, unspecified: Secondary | ICD-10-CM | POA: Diagnosis not present

## 2013-11-24 DIAGNOSIS — E538 Deficiency of other specified B group vitamins: Secondary | ICD-10-CM | POA: Diagnosis not present

## 2013-11-24 MED ORDER — OXYCODONE HCL ER 10 MG PO T12A: 10.0000 mg | EXTENDED_RELEASE_TABLET | Freq: Two times a day (BID) | ORAL | Status: DC

## 2013-11-24 MED ORDER — ROPINIROLE HCL 2 MG PO TABS: ORAL_TABLET | ORAL | Status: DC

## 2013-11-24 NOTE — Progress Notes (Signed)
Subjective:      HPI  C/o low PLTs after RA infusion. Pt asked to have labs done here  F/u a fall on 07/07/13 he broke R thumb; bruise over R eye and R knee. She had a surgery on R thumb fx. No LOC  F/u  insomnia F/u vag dryness and pt would like to re-start Vagifem pv qod as before (per her GYN)  F/u R CTS  and neck pain - Dr Joya Salm is planning to do surgeries: she had a myelogram   S/p thor AA and AR recently repaired. AVR is repaired Dr Cyndia Bent - she is c/o scar area irritation/pain  S/p L shoulder replacement 12/07/11 by Dr Mardelle Matte. F/u pain in L arm - better   The patient is here to follow up on chronic depression, anxiety, headaches and chronic moderate fibromyalgia symptoms controlled with medicines, stable. She is on Sympony for RA q 2 mo  Hematol said - no need for anticoagulation  Wt Readings from Last 3 Encounters:  09/22/13 163 lb (73.936 kg)  07/25/13 162 lb (73.483 kg)  07/07/13 159 lb (72.122 kg)   BP Readings from Last 3 Encounters:  09/22/13 146/82  07/25/13 120/62  07/07/13 148/81   Past Medical History  Diagnosis Date  . Fibromyalgia   . Pulmonary embolism   . RA (rheumatoid arthritis)   . Vitamin B 12 deficiency   . Vitamin D deficiency   . Osteoarthritis   . Osteopenia   . Adrenal insufficiency   . IBS (irritable bowel syndrome)   . History of blood clots 2008    below knee  . Diabetes mellitus type II     pt denies this 03-05-11  . Esophagitis   . Depression   . GERD (gastroesophageal reflux disease)   . Connective tissue disorder   . Cataracts, bilateral     more in left than right  . Osteoarthritis of left shoulder 12/07/2011  . Heart murmur   . Leaky heart valve   . Anginal pain     "comes and goes has occured since 2012"  . Bronchitis Jan 2013-March 2013    severe  . Shortness of breath     comes from aneurysm  . Vertigo     hx of  . Urinary leakage     when coughing  . Sliding hiatal hernia   . Fatty liver    Past Surgical  History  Procedure Laterality Date  . Abdominal hysterectomy    . Total knee arthroplasty      bilateral  . Cholecystectomy  2003  . Tonsillectomy  1941  . Appendectomy  1995  . Wrist surgery      bilateral  . Bilateral oophorectomy    . Tummy tuck    . Cosmetic surgery      Tummy tuck  . Total shoulder arthroplasty  12/07/2011    Procedure: TOTAL SHOULDER ARTHROPLASTY;  Surgeon: Johnny Bridge, MD;  Location: [facility];  Service: Orthopedics;  Laterality: Left;  left total shoulder artthroplasty  . Joint replacement Bilateral   . Cardiac catheterization  09/15/12    no PCI  . Colonoscopy    . Breast biopsy Left   . Skin cancer excision Right     cheek  . Eye surgery Bilateral     Cataract  . Bentall procedure N/A 09/29/2012    Procedure: BENTALL PROCEDURE;  Surgeon: Gaye Pollack, MD;  Location: [facility];  Service: Open Heart Surgery;  Laterality: N/A;  WITH CIRC ARREST  . Intraoperative transesophageal echocardiogram N/A 09/29/2012    Procedure: INTRAOPERATIVE TRANSESOPHAGEAL ECHOCARDIOGRAM;  Surgeon: Gaye Pollack, MD;  Location: [facility] OR;  Service: Open Heart Surgery;  Laterality: N/A;  . Aorta surgery      reports that she has never smoked. She has never used smokeless tobacco. She reports that she drinks alcohol. She reports that she does not use illicit drugs. family history includes Breast cancer in an other family member; COPD in her father; Clotting disorder in an other family member; Diabetes in her mother; Heart disease in her maternal grandmother; Ovarian cancer in her mother; Pancreatic cancer in an other family member. There is no history of Colon cancer. Allergies  Allergen Reactions  . Codeine Other (See Comments)    Blood pressure drops; "I pass out."  . Pramipexole Dihydrochloride Nausea Only    sick, falling  . Rofecoxib Other (See Comments)    "sleep walks"  . Arava [Leflunomide] Swelling  . Clonazepam   . Diclofenac Sodium   . Venlafaxine   . Alprazolam Other  (See Comments)    "Makes me mean"  . Benzodiazepines Other (See Comments)    "Halllucinations?"  . Darvocet [Propoxyphene N-Acetaminophen] Other (See Comments)    "makes my eyes dilate. Pt stated I can't see"  . Duloxetine Other (See Comments)    achy legs  . Gabapentin Other (See Comments)    headache  . Latex Itching and Dermatitis    When examined with latex gloves, burns and itches in contact areas per pt.  . Morphine And Related Other (See Comments)    Hallucinations   . Nortriptyline Hcl Other (See Comments)    Migraines   . Penicillins Rash    Can take Cephalosporins  . Prednisone Swelling    Just doesn't want to take it.   Current Outpatient Prescriptions on File Prior to Visit  Medication Sig Dispense Refill  . aspirin EC 81 MG EC tablet Take 1 tablet (81 mg total) by mouth daily.      Marland Kitchen azithromycin (ZITHROMAX) 250 MG tablet Take 2 tabs 1 hr prior to dental work prn  12 tablet  0  . Estradiol (VAGIFEM) 10 MCG TABS vaginal tablet INSERT ONE TABLET VAGINALLY EVERY 2 (TWO) DAYS  15 tablet  2  . fluconazole (DIFLUCAN) 100 MG tablet Take 1 tablet (100 mg total) by mouth as directed.  11 tablet  0  . lidocaine (LIDODERM) 5 % Place 3 patches onto the skin daily as needed. Remove & Discard patch within 12 hours or as directed by MD      . loratadine (CLARITIN) 10 MG tablet Take 10 mg by mouth daily.      Marland Kitchen LYRICA 25 MG capsule TAKE ONE CAPSULE BY MOUTH THREE TIMES DAILY  30 capsule  5  . mupirocin cream (BACTROBAN) 2 % Apply topically daily.  25 g  0  . NEXIUM 40 MG capsule TAKE ONE CAPSULE BY MOUTH TWICE DAILY  60 capsule  0  . OxyCODONE (OXYCONTIN) 10 mg T12A 12 hr tablet Take 1 tablet (10 mg total) by mouth every 12 (twelve) hours. Please fill on or after 10/25/13  60 tablet  0  . Probiotic Product (PROBIOTIC DAILY PO) Take by mouth daily.      . ramelteon (ROZEREM) 8 MG tablet Take 1 tablet (8 mg total) by mouth at bedtime.  30 tablet  5  . rOPINIRole (REQUIP) 2 MG tablet  TAKE ONE TABLET BY MOUTH THREE  TIMES DAILY  90 tablet  0  . triamcinolone cream (KENALOG) 0.5 % Apply 1 application topically 3 (three) times daily.  45 g  1   No current facility-administered medications on file prior to visit.      Review of Systems  Constitutional: Negative for activity change, appetite change, fatigue and unexpected weight change.  HENT: Positive for sinus pressure. Negative for congestion and mouth sores.   Eyes: Negative for visual disturbance.  Respiratory: Negative for chest tightness.   Gastrointestinal: Negative for nausea and abdominal pain.  Genitourinary: Negative for frequency, difficulty urinating and vaginal pain.  Musculoskeletal: Positive for arthralgias and back pain. Negative for gait problem.  Skin: Negative for pallor.  Neurological: Negative for dizziness, tremors, weakness and numbness.  Psychiatric/Behavioral: Negative for confusion and sleep disturbance.   There were no vitals taken for this visit.     Objective:   Physical Exam  Constitutional: She appears well-developed and well-nourished. No distress.  HENT:  Head: Normocephalic.  Right Ear: External ear normal.  Left Ear: External ear normal.  Nose: Nose normal.  Mouth/Throat: Oropharynx is clear and moist.  Eyes: Conjunctivae are normal. Pupils are equal, round, and reactive to light. Right eye exhibits no discharge. Left eye exhibits no discharge.  Neck: Normal range of motion. Neck supple. No JVD present. No tracheal deviation present. No thyromegaly present.  Cardiovascular: Normal rate, regular rhythm and normal heart sounds.   Pulmonary/Chest: No stridor. No respiratory distress. She has no wheezes.  Abdominal: Soft. Bowel sounds are normal. She exhibits no distension and no mass. There is no tenderness. There is no rebound and no guarding.  Musculoskeletal: She exhibits tenderness. She exhibits no edema.  L shoulder is not tender w/ROM Neck is tender w/ROM  Lymphadenopathy:     She has no cervical adenopathy.  Neurological: She displays normal reflexes. No cranial nerve deficit. She exhibits normal muscle tone. Coordination normal.  Skin: No rash noted. No erythema.  Psychiatric: She has a normal mood and affect. Her behavior is normal. Judgment and thought content normal.  sad  keloid looking scar, tender - chest R wrist/forearm is in a splint R eye is bruised R knee is bruised Lab Results  Component Value Date   WBC 4.3* 01/27/2013   HGB 10.9* 01/27/2013   HCT 33.0* 01/27/2013   PLT 137.0 Repeated and verified X2.* 01/27/2013   GLUCOSE 95 01/27/2013   ALT 17 01/27/2013   AST 27 01/27/2013   NA 138 01/27/2013   K 4.3 01/27/2013   CL 106 01/27/2013   CREATININE 1.1 01/27/2013   BUN 21 01/27/2013   CO2 25 01/27/2013   TSH 2.88 01/09/2011   INR 1.34 09/29/2012   HGBA1C 5.7* 09/27/2012           Assessment & Plan:

## 2013-11-24 NOTE — Assessment & Plan Note (Signed)
Potential benefits of a long term steroid  use as well as potential risks  and complications were explained to the patient and were aknowledged.  Continue with current prescription therapy as reflected on the Med list.

## 2013-11-24 NOTE — Assessment & Plan Note (Signed)
Continue with current prescription therapy as reflected on the Med list.  

## 2013-11-24 NOTE — Assessment & Plan Note (Signed)
2015 likely due to RA Rx Lab q 3 mo F/u w/Dr Estanislado Pandy

## 2013-11-24 NOTE — Assessment & Plan Note (Signed)
Dr Patrecia Pour On Sympony q 2 mo -- low PLTs

## 2013-11-24 NOTE — Progress Notes (Signed)
Pre visit review using our clinic review tool, if applicable. No additional management support is needed unless otherwise documented below in the visit note. 

## 2013-11-27 ENCOUNTER — Encounter (HOSPITAL_COMMUNITY): Payer: Self-pay

## 2013-11-29 ENCOUNTER — Encounter (HOSPITAL_COMMUNITY): Payer: Self-pay

## 2013-12-01 ENCOUNTER — Encounter (HOSPITAL_COMMUNITY): Payer: Self-pay

## 2013-12-14 ENCOUNTER — Other Ambulatory Visit (INDEPENDENT_AMBULATORY_CARE_PROVIDER_SITE_OTHER): Payer: Medicare Other

## 2013-12-14 DIAGNOSIS — B370 Candidal stomatitis: Secondary | ICD-10-CM

## 2013-12-14 DIAGNOSIS — F3289 Other specified depressive episodes: Secondary | ICD-10-CM

## 2013-12-14 DIAGNOSIS — T50905A Adverse effect of unspecified drugs, medicaments and biological substances, initial encounter: Principal | ICD-10-CM

## 2013-12-14 DIAGNOSIS — E538 Deficiency of other specified B group vitamins: Secondary | ICD-10-CM

## 2013-12-14 DIAGNOSIS — F329 Major depressive disorder, single episode, unspecified: Secondary | ICD-10-CM

## 2013-12-14 DIAGNOSIS — T50904A Poisoning by unspecified drugs, medicaments and biological substances, undetermined, initial encounter: Secondary | ICD-10-CM | POA: Diagnosis not present

## 2013-12-14 DIAGNOSIS — D6959 Other secondary thrombocytopenia: Secondary | ICD-10-CM | POA: Diagnosis not present

## 2013-12-14 DIAGNOSIS — J449 Chronic obstructive pulmonary disease, unspecified: Secondary | ICD-10-CM | POA: Diagnosis not present

## 2013-12-14 DIAGNOSIS — IMO0001 Reserved for inherently not codable concepts without codable children: Secondary | ICD-10-CM

## 2013-12-14 DIAGNOSIS — Z954 Presence of other heart-valve replacement: Secondary | ICD-10-CM

## 2013-12-14 DIAGNOSIS — Z952 Presence of prosthetic heart valve: Secondary | ICD-10-CM

## 2013-12-14 LAB — CBC WITH DIFFERENTIAL/PLATELET
Basophils Absolute: 0 10*3/uL (ref 0.0–0.1)
Basophils Relative: 0.7 % (ref 0.0–3.0)
EOS ABS: 0.1 10*3/uL (ref 0.0–0.7)
Eosinophils Relative: 3.4 % (ref 0.0–5.0)
HCT: 38 % (ref 36.0–46.0)
Hemoglobin: 12.8 g/dL (ref 12.0–15.0)
Lymphocytes Relative: 33.2 % (ref 12.0–46.0)
Lymphs Abs: 1.2 10*3/uL (ref 0.7–4.0)
MCHC: 33.7 g/dL (ref 30.0–36.0)
MCV: 95 fl (ref 78.0–100.0)
MONO ABS: 0.3 10*3/uL (ref 0.1–1.0)
Monocytes Relative: 8.7 % (ref 3.0–12.0)
NEUTROS PCT: 54 % (ref 43.0–77.0)
Neutro Abs: 2 10*3/uL (ref 1.4–7.7)
Platelets: 122 10*3/uL — ABNORMAL LOW (ref 150.0–400.0)
RBC: 4 Mil/uL (ref 3.87–5.11)
RDW: 13.6 % (ref 11.5–15.5)
WBC: 3.6 10*3/uL — AB (ref 4.0–10.5)

## 2013-12-14 LAB — BASIC METABOLIC PANEL
BUN: 21 mg/dL (ref 6–23)
CHLORIDE: 105 meq/L (ref 96–112)
CO2: 29 mEq/L (ref 19–32)
Calcium: 9.2 mg/dL (ref 8.4–10.5)
Creatinine, Ser: 1.2 mg/dL (ref 0.4–1.2)
GFR: 46.23 mL/min — ABNORMAL LOW (ref >60.00)
Glucose, Bld: 108 mg/dL — ABNORMAL HIGH (ref 70–99)
POTASSIUM: 4.2 meq/L (ref 3.5–5.1)
Sodium: 139 mEq/L (ref 135–145)

## 2013-12-14 LAB — HEPATIC FUNCTION PANEL
ALT: 14 U/L (ref 0–35)
AST: 23 U/L (ref 0–37)
Albumin: 3.9 g/dL (ref 3.5–5.2)
Alkaline Phosphatase: 66 U/L (ref 39–117)
BILIRUBIN DIRECT: 0.1 mg/dL (ref 0.0–0.3)
BILIRUBIN TOTAL: 0.4 mg/dL (ref 0.2–1.2)
Total Protein: 7 g/dL (ref 6.0–8.3)

## 2013-12-14 LAB — IBC PANEL
IRON: 68 ug/dL (ref 42–145)
Saturation Ratios: 18.4 % — ABNORMAL LOW (ref 20.0–50.0)
TRANSFERRIN: 263.3 mg/dL (ref 212.0–360.0)

## 2013-12-28 DIAGNOSIS — M069 Rheumatoid arthritis, unspecified: Secondary | ICD-10-CM | POA: Diagnosis not present

## 2013-12-28 DIAGNOSIS — Z79899 Other long term (current) drug therapy: Secondary | ICD-10-CM | POA: Diagnosis not present

## 2014-01-24 ENCOUNTER — Ambulatory Visit (INDEPENDENT_AMBULATORY_CARE_PROVIDER_SITE_OTHER): Payer: Medicare Other | Admitting: Internal Medicine

## 2014-01-24 ENCOUNTER — Encounter: Payer: Self-pay | Admitting: Internal Medicine

## 2014-01-24 VITALS — BP 118/72 | HR 76 | Temp 98.7°F | Resp 16 | Wt 165.0 lb

## 2014-01-24 DIAGNOSIS — L570 Actinic keratosis: Secondary | ICD-10-CM | POA: Diagnosis not present

## 2014-01-24 DIAGNOSIS — Z79899 Other long term (current) drug therapy: Secondary | ICD-10-CM | POA: Diagnosis not present

## 2014-01-24 DIAGNOSIS — F329 Major depressive disorder, single episode, unspecified: Secondary | ICD-10-CM

## 2014-01-24 DIAGNOSIS — F3289 Other specified depressive episodes: Secondary | ICD-10-CM | POA: Diagnosis not present

## 2014-01-24 DIAGNOSIS — IMO0001 Reserved for inherently not codable concepts without codable children: Secondary | ICD-10-CM

## 2014-01-24 DIAGNOSIS — E538 Deficiency of other specified B group vitamins: Secondary | ICD-10-CM

## 2014-01-24 MED ORDER — OXYCODONE HCL ER 10 MG PO T12A: 10.0000 mg | EXTENDED_RELEASE_TABLET | Freq: Two times a day (BID) | ORAL | Status: DC

## 2014-01-24 MED ORDER — HYDROXYZINE HCL 25 MG PO TABS: 25.0000 mg | ORAL_TABLET | Freq: Every evening | ORAL | Status: DC | PRN

## 2014-01-24 NOTE — Assessment & Plan Note (Signed)
Continue with current prescription therapy as reflected on the Med list.  

## 2014-01-24 NOTE — Progress Notes (Signed)
Subjective:      HPI C/o AKs on face, keloid scar post-sternotomy  F/u low PLTs after RA infusion. Pt asked to have labs done here  F/u a fall on 07/07/13 he broke R thumb; bruise over R eye and R knee. She had a surgery on R thumb fx. No LOC  F/u  insomnia F/u vag dryness and pt would like to re-start Vagifem pv qod as before (per her GYN)  F/u R CTS  and neck pain - Dr Joya Salm is planning to do surgeries: she had a myelogram   S/p thor AA and AR recently repaired. AVR is repaired Dr Cyndia Bent - she is c/o scar area irritation/pain  S/p L shoulder replacement 12/07/11 by Dr Mardelle Matte. F/u pain in L arm - better   The patient is here to follow up on chronic depression, anxiety, headaches and chronic moderate fibromyalgia symptoms controlled with medicines, stable. She is on Sympony for RA q 2 mo  Hematol said - no need for anticoagulation  Wt Readings from Last 3 Encounters:  01/24/14 165 lb (74.844 kg)  11/24/13 164 lb 12.8 oz (74.753 kg)  09/22/13 163 lb (73.936 kg)   BP Readings from Last 3 Encounters:  01/24/14 118/72  11/24/13 130/90  09/22/13 146/82   Past Medical History  Diagnosis Date  . Fibromyalgia   . Pulmonary embolism   . RA (rheumatoid arthritis)   . Vitamin B 12 deficiency   . Vitamin D deficiency   . Osteoarthritis   . Osteopenia   . Adrenal insufficiency   . IBS (irritable bowel syndrome)   . History of blood clots 2008    below knee  . Diabetes mellitus type II     pt denies this 03-05-11  . Esophagitis   . Depression   . GERD (gastroesophageal reflux disease)   . Connective tissue disorder   . Cataracts, bilateral     more in left than right  . Osteoarthritis of left shoulder 12/07/2011  . Heart murmur   . Leaky heart valve   . Anginal pain     "comes and goes has occured since 2012"  . Bronchitis Jan 2013-March 2013    severe  . Shortness of breath     comes from aneurysm  . Vertigo     hx of  . Urinary leakage     when coughing  .  Sliding hiatal hernia   . Fatty liver    Past Surgical History  Procedure Laterality Date  . Abdominal hysterectomy    . Total knee arthroplasty      bilateral  . Cholecystectomy  2003  . Tonsillectomy  1941  . Appendectomy  1995  . Wrist surgery      bilateral  . Bilateral oophorectomy    . Tummy tuck    . Cosmetic surgery      Tummy tuck  . Total shoulder arthroplasty  12/07/2011    Procedure: TOTAL SHOULDER ARTHROPLASTY;  Surgeon: Johnny Bridge, MD;  Location: [place];  Service: Orthopedics;  Laterality: Left;  left total shoulder artthroplasty  . Joint replacement Bilateral   . Cardiac catheterization  09/15/12    no PCI  . Colonoscopy    . Breast biopsy Left   . Skin cancer excision Right     cheek  . Eye surgery Bilateral     Cataract  . Bentall procedure N/A 09/29/2012    Procedure: BENTALL PROCEDURE;  Surgeon: Gaye Pollack, MD;  Location: [place];  Service: Open Heart Surgery;  Laterality: N/A;  WITH CIRC ARREST  . Intraoperative transesophageal echocardiogram N/A 09/29/2012    Procedure: INTRAOPERATIVE TRANSESOPHAGEAL ECHOCARDIOGRAM;  Surgeon: Gaye Pollack, MD;  [facility];  Service: Open Heart Surgery;  Laterality: N/A;  . Aorta surgery      reports that she has never smoked. She has never used smokeless tobacco. She reports that she drinks alcohol. She reports that she does not use illicit drugs. family history includes Breast cancer in an other family member; COPD in her father; Clotting disorder in an other family member; Diabetes in her mother; Heart disease in her maternal grandmother; Ovarian cancer in her mother; Pancreatic cancer in an other family member. There is no history of Colon cancer. Allergies  Allergen Reactions  . Codeine Other (See Comments)    Blood pressure drops; "I pass out."  . Pramipexole Dihydrochloride Nausea Only    sick, falling  . Rofecoxib Other (See Comments)    "sleep walks"  . Arava [Leflunomide] Swelling  . Clonazepam   .  Diclofenac Sodium   . Venlafaxine   . Alprazolam Other (See Comments)    "Makes me mean"  . Benzodiazepines Other (See Comments)    "Halllucinations?"  . Darvocet [Propoxyphene N-Acetaminophen] Other (See Comments)    "makes my eyes dilate. Pt stated I can't see"  . Duloxetine Other (See Comments)    achy legs  . Gabapentin Other (See Comments)    headache  . Latex Itching and Dermatitis    When examined with latex gloves, burns and itches in contact areas per pt.  . Morphine And Related Other (See Comments)    Hallucinations   . Nortriptyline Hcl Other (See Comments)    Migraines   . Penicillins Rash    Can take Cephalosporins  . Prednisone Swelling    Just doesn't want to take it.   Current Outpatient Prescriptions on File Prior to Visit  Medication Sig Dispense Refill  . aspirin EC 81 MG EC tablet Take 1 tablet (81 mg total) by mouth daily.      Marland Kitchen azithromycin (ZITHROMAX) 250 MG tablet Take 2 tabs 1 hr prior to dental work prn  12 tablet  0  . Estradiol (VAGIFEM) 10 MCG TABS vaginal tablet INSERT ONE TABLET VAGINALLY EVERY 2 (TWO) DAYS  15 tablet  2  . fluconazole (DIFLUCAN) 100 MG tablet Take 1 tablet (100 mg total) by mouth as directed.  11 tablet  0  . loratadine (CLARITIN) 10 MG tablet Take 10 mg by mouth daily.      Marland Kitchen NEXIUM 40 MG capsule TAKE ONE CAPSULE BY MOUTH TWICE DAILY  60 capsule  0  . OxyCODONE (OXYCONTIN) 10 mg T12A 12 hr tablet Take 1 tablet (10 mg total) by mouth every 12 (twelve) hours. Please fill on or after 12/25/13  60 tablet  0  . Probiotic Product (PROBIOTIC DAILY PO) Take by mouth daily.      Marland Kitchen rOPINIRole (REQUIP) 2 MG tablet TAKE ONE TABLET BY MOUTH THREE TIMES DAILY  90 tablet  5  . triamcinolone cream (KENALOG) 0.5 % Apply 1 application topically 3 (three) times daily.  45 g  1   No current facility-administered medications on file prior to visit.      Review of Systems  Constitutional: Negative for activity change, appetite change, fatigue  and unexpected weight change.  HENT: Positive for sinus pressure. Negative for congestion and mouth sores.   Eyes: Negative for visual disturbance.  Respiratory: Negative for chest tightness.   Gastrointestinal: Negative for nausea and abdominal pain.  Genitourinary: Negative for frequency, difficulty urinating and vaginal pain.  Musculoskeletal: Positive for arthralgias and back pain. Negative for gait problem.  Skin: Negative for pallor.  Neurological: Negative for dizziness, tremors, weakness and numbness.  Psychiatric/Behavioral: Negative for confusion and sleep disturbance.   BP 118/72  Pulse 76  Temp(Src) 98.7 F (37.1 C) (Oral)  Resp 16  Wt 165 lb (74.844 kg)     Objective:   Physical Exam  Constitutional: She appears well-developed and well-nourished. No distress.  HENT:  Head: Normocephalic.  Right Ear: External ear normal.  Left Ear: External ear normal.  Nose: Nose normal.  Mouth/Throat: Oropharynx is clear and moist.  Eyes: Conjunctivae are normal. Pupils are equal, round, and reactive to light. Right eye exhibits no discharge. Left eye exhibits no discharge.  Neck: Normal range of motion. Neck supple. No JVD present. No tracheal deviation present. No thyromegaly present.  Cardiovascular: Normal rate, regular rhythm and normal heart sounds.   Pulmonary/Chest: No stridor. No respiratory distress. She has no wheezes.  Abdominal: Soft. Bowel sounds are normal. She exhibits no distension and no mass. There is no tenderness. There is no rebound and no guarding.  Musculoskeletal: She exhibits tenderness. She exhibits no edema.  L shoulder is not tender w/ROM Neck is tender w/ROM  Lymphadenopathy:    She has no cervical adenopathy.  Neurological: She displays normal reflexes. No cranial nerve deficit. She exhibits normal muscle tone. Coordination normal.  Skin: No rash noted. No erythema.  Psychiatric: She has a normal mood and affect. Her behavior is normal. Judgment and  thought content normal.  sad  keloid looking scar, tender - chest R knee is bruised R nose AKs  Lab Results  Component Value Date   WBC 3.6* 12/14/2013   HGB 12.8 12/14/2013   HCT 38.0 12/14/2013   PLT 122.0* 12/14/2013   GLUCOSE 108* 12/14/2013   ALT 14 12/14/2013   AST 23 12/14/2013   NA 139 12/14/2013   K 4.2 12/14/2013   CL 105 12/14/2013   CREATININE 1.2 12/14/2013   BUN 21 12/14/2013   CO2 29 12/14/2013   TSH 2.88 01/09/2011   INR 1.34 09/29/2012   HGBA1C 5.7* 09/27/2012    Procedure Note :     Procedure : Cryosurgery   Indication:  Actinic keratosis(es)   Risks including unsuccessful procedure , bleeding, infection, bruising, scar, a need for a repeat  procedure and others were explained to the patient in detail as well as the benefits. Informed consent was obtained verbally.   3  lesion(s)  on  R nose  was/were treated with liquid nitrogen on a Q-tip in a usual fasion . Band-Aid was applied and antibiotic ointment was given for a later use.   Tolerated well. Complications none.   Postprocedure instructions :     Keep the wounds clean. You can wash them with liquid soap and water. Pat dry with gauze or a Kleenex tissue  Before applying antibiotic ointment and a Band-Aid.   You need to report immediately  if  any signs of infection develop.           Assessment & Plan:

## 2014-01-24 NOTE — Patient Instructions (Signed)
   Postprocedure instructions :     Keep the wounds clean. You can wash them with liquid soap and water. Pat dry with gauze or a Kleenex tissue  Before applying antibiotic ointment and a Band-Aid.   You need to report immediately  if  any signs of infection develop.    

## 2014-01-24 NOTE — Assessment & Plan Note (Signed)
Chronic FMS/CTD, severe Continue with current prescription therapy as reflected on the Med list.

## 2014-01-24 NOTE — Progress Notes (Deleted)
Pre visit review using our clinic review tool, if applicable. No additional management support is needed unless otherwise documented below in the visit note. 

## 2014-01-25 DIAGNOSIS — L570 Actinic keratosis: Secondary | ICD-10-CM | POA: Insufficient documentation

## 2014-01-25 NOTE — Assessment & Plan Note (Signed)
Cryo Pt declined Derm ref

## 2014-02-08 ENCOUNTER — Encounter: Payer: Self-pay | Admitting: *Deleted

## 2014-02-08 ENCOUNTER — Encounter: Payer: Self-pay | Admitting: Cardiology

## 2014-02-08 ENCOUNTER — Ambulatory Visit (INDEPENDENT_AMBULATORY_CARE_PROVIDER_SITE_OTHER): Payer: Medicare Other | Admitting: Cardiology

## 2014-02-08 VITALS — BP 142/76 | HR 74 | Ht 61.0 in | Wt 164.4 lb

## 2014-02-08 DIAGNOSIS — Z95828 Presence of other vascular implants and grafts: Secondary | ICD-10-CM

## 2014-02-08 DIAGNOSIS — Z954 Presence of other heart-valve replacement: Secondary | ICD-10-CM

## 2014-02-08 DIAGNOSIS — I359 Nonrheumatic aortic valve disorder, unspecified: Secondary | ICD-10-CM

## 2014-02-08 DIAGNOSIS — I712 Thoracic aortic aneurysm, without rupture, unspecified: Secondary | ICD-10-CM

## 2014-02-08 DIAGNOSIS — Z952 Presence of prosthetic heart valve: Secondary | ICD-10-CM

## 2014-02-08 LAB — BASIC METABOLIC PANEL
BUN: 19 mg/dL (ref 6–23)
CO2: 27 mEq/L (ref 19–32)
CREATININE: 1.2 mg/dL (ref 0.4–1.2)
Calcium: 9.2 mg/dL (ref 8.4–10.5)
Chloride: 106 mEq/L (ref 96–112)
GFR: 48.06 mL/min — AB (ref >60.00)
Glucose, Bld: 94 mg/dL (ref 70–99)
Potassium: 4 mEq/L (ref 3.5–5.1)
Sodium: 140 mEq/L (ref 135–145)

## 2014-02-08 NOTE — Patient Instructions (Signed)
Schedule an appointment for a CTA of your chest.  Your physician has requested that you have an echocardiogram. Echocardiography is a painless test that uses sound waves to create images of your heart. It provides your doctor with information about the size and shape of your heart and how well your heart's chambers and valves are working. This procedure takes approximately one hour. There are no restrictions for this procedure.  Your physician recommends that you have  lab work today--BMET.  Your physician wants you to follow-up in: 1 year with Dr Aundra Dubin. (October 2016).You will receive a reminder letter in the mail two months in advance. If you don't receive a letter, please call our office to schedule the follow-up appointment.

## 2014-02-09 NOTE — Progress Notes (Signed)
Patient ID: Diane Abbott, female   DOB: [birth year], 79 y.o.   MRN: [id number] PCP: Dr. Alain Marion  79 yo with history of OA, RA, PE/DVT, and status post Bentall bioprosthetic valved conduit and ascending aorta/proximal arch replacement presents for followup.  She had been having problems with C-spine arthritis.  She had an MRI of her neck done, which noted ascending aortic aneurysm.  Therefore, CTA chest was done.  This showed a 5.8 cm ascending aortic aneurysm extending to the proximal arch.  Echo showed moderate AI with trileaflet aortic valve.  LHC showed no significant cardiac disease and 3+ AI.   In 5/14, she had Bentall procedure with 21 mm bioprosthetic valved conduit as well as replacement of the ascending aorta and proximal arch.  Post-op echo showed EF 50%, well-seated bioprosthetic aortic valve. She had CTA chest done in the 9/14 to make sure that this did not signify any problems with her surgical ascending aorta repair.  This showed stable repaired ascending aorta and 4.8 cm arch beyond the repair.    Patient is stable.  She is mildly dyspneic with stairs.  No chest pain, no orthopnea, no PND.  No dyspnea walking on flat ground.   Labs (4/14): K 4.4, creatinine 1.3 Labs (7/14): K 4.5, creatinine 1.3, LFTs normal Labs (9/14): K 4.2, creatinine 1.1 Labs (8/15): K 4.2, creaitnine 1.2  ECG: NSR, normal  PMH: 1. C-spine OA 2. Left shoulder replacement in 8/13.  3. Bilateral TKR.  4. Depression 5. Anxiety 6. Fibromyalgia 7. Rheumatoid arthritis.  She is getting Sympony injections.  8. 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  Last event was in 2008.  Xarelto stopped by heme/onc in 2014. 9. GERD 10. Vitamin B12 deficiency.  11. Cholecystectomy 12. Abdominal hysterectomy 13. Ascending aortic aneurysm: 4.5 cm by CT in 2009.  5.8 cm by CTA chest in 4/14.  In 5/14, she had Bentall procedure with 21 mm bioprosthetic valved conduit as well as replacement of the  ascending aorta and proximal arch, coronaries reimplanted.  Post-op echo (6/14) showed EF 50%, septal dyssynergy, well-seated bioprosthetic aortic valve with mean gradient 16 mmHg.  CTA chest (9/14) with stable replaced ascending aorta and 4.8 cm arch beyond the replaced segment. 14. Aortic insufficiency: Echo (4/14) with EF 60-65%, moderate AI, trileaflet aortic valve that incompletely coapts.  LHC showed 3+ AI in 5/14.  Aortic valve replaced with bioprosthetic valved conduit in 5/14.  15. LHC (5/14) with no significant coronary disease.   16. Candidal esophagitis  SH: Lives in [place], nonsmoker, occasional ETOH.   FH: Familial hypercoagulable state.  Grandmother with "heart problem."   ROS: All systems reviewed and negative except as per HPI.    Current Outpatient Prescriptions  Medication Sig Dispense Refill  . aspirin EC 81 MG EC tablet Take 1 tablet (81 mg total) by mouth daily.      . Estradiol (VAGIFEM) 10 MCG TABS vaginal tablet INSERT ONE TABLET VAGINALLY EVERY 2 (TWO) DAYS  15 tablet  2  . fluconazole (DIFLUCAN) 100 MG tablet Take 1 tablet (100 mg total) by mouth as directed.  11 tablet  0  . hydrOXYzine (ATARAX/VISTARIL) 25 MG tablet Take 1-2 tablets (25-50 mg total) by mouth at bedtime as needed (insomnia).  60 tablet  1  . loratadine (CLARITIN) 10 MG tablet Take 10 mg by mouth daily.      Marland Kitchen NEXIUM 40 MG capsule TAKE ONE CAPSULE BY MOUTH TWICE DAILY  60 capsule  0  .  OxyCODONE (OXYCONTIN) 10 mg T12A 12 hr tablet Take 1 tablet (10 mg total) by mouth every 12 (twelve) hours. Please fill on or after 02/24/14  60 tablet  0  . Probiotic Product (PROBIOTIC DAILY PO) Take by mouth daily.      Marland Kitchen rOPINIRole (REQUIP) 2 MG tablet TAKE ONE TABLET BY MOUTH THREE TIMES DAILY  90 tablet  5  . triamcinolone cream (KENALOG) 0.5 % Apply 1 application topically 3 (three) times daily.  45 g  1   No current facility-administered medications for this visit.   BP 142/76  Pulse 74  Ht 5\' 1"   (1.549 m)  Wt 164 lb 6.4 oz (74.571 kg)  BMI 31.08 kg/m2 General: NAD Neck: JVP 7 cm, no thyromegaly or thyroid nodule.  Lungs: Clear to auscultation bilaterally with normal respiratory effort. CV: Nondisplaced PMI.  Heart regular S1/S2, no S3/S4, 2/6 early SEM.  No peripheral edema.  No carotid bruit.  Normal pedal pulses.  Abdomen: Soft, nontender, no hepatosplenomegaly, no distention.  Skin: Intact without lesions or rashes.  Neurologic: Alert and oriented x 3.  Psych: Normal affect. Extremities: No clubbing or cyanosis.   Assessment/Plan: 1. Ascending aortic aneurysm: Now status post graft replacement of the ascending aorta and the proximal arch.  4.8 cm arch beyond the repair on 9/14 CTA.  I will arrange for repeat CTA chest. 2. Aortic insufficiency: 3+ AI. Therefore, patient had bioprosthetic valved conduit placed when ascending aorta was repaired.  Valve was well-seated on post-op echo in 6/14. I am going to repeat an echocardiogram.  3. History of venous thromboembolism: Xarelto was stopped by heme/onc.  Followup in 1 year.    Loralie Champagne 02/09/2014

## 2014-02-13 ENCOUNTER — Encounter: Payer: Self-pay | Admitting: Internal Medicine

## 2014-02-14 ENCOUNTER — Ambulatory Visit (INDEPENDENT_AMBULATORY_CARE_PROVIDER_SITE_OTHER)
Admission: RE | Admit: 2014-02-14 | Discharge: 2014-02-14 | Disposition: A | Discharge status: Home / Self Care | Payer: Medicare Other | Source: Ambulatory Visit | Attending: Cardiology | Admitting: Cardiology

## 2014-02-14 ENCOUNTER — Ambulatory Visit (HOSPITAL_COMMUNITY): Payer: Medicare Other | Attending: Cardiology | Admitting: Cardiology

## 2014-02-14 DIAGNOSIS — I712 Thoracic aortic aneurysm, without rupture, unspecified: Secondary | ICD-10-CM

## 2014-02-14 DIAGNOSIS — M25571 Pain in right ankle and joints of right foot: Secondary | ICD-10-CM | POA: Diagnosis not present

## 2014-02-14 DIAGNOSIS — I359 Nonrheumatic aortic valve disorder, unspecified: Secondary | ICD-10-CM | POA: Diagnosis not present

## 2014-02-14 DIAGNOSIS — M79641 Pain in right hand: Secondary | ICD-10-CM | POA: Diagnosis not present

## 2014-02-14 DIAGNOSIS — M25511 Pain in right shoulder: Secondary | ICD-10-CM | POA: Diagnosis not present

## 2014-02-14 MED ORDER — IOHEXOL 350 MG/ML SOLN
100.0000 mL | Freq: Once | INTRAVENOUS | Status: AC | PRN
  Administered 2014-02-14: 100 mL via INTRAVENOUS

## 2014-02-14 NOTE — Progress Notes (Signed)
Echo performed. 

## 2014-02-20 ENCOUNTER — Telehealth: Payer: Self-pay | Admitting: Cardiology

## 2014-02-20 ENCOUNTER — Telehealth: Payer: Self-pay | Admitting: *Deleted

## 2014-02-20 NOTE — Telephone Encounter (Signed)
Spoke with patient about recent test results. 

## 2014-02-20 NOTE — Telephone Encounter (Signed)
New message     Returning Anne's call

## 2014-02-20 NOTE — Telephone Encounter (Signed)
Called [employer], answered all questions re: Atarax PA. Representative I spoke to states she will forward info for review and we will receive a call back tomorrow with decision.

## 2014-02-21 DIAGNOSIS — Z79899 Other long term (current) drug therapy: Secondary | ICD-10-CM | POA: Diagnosis not present

## 2014-02-21 DIAGNOSIS — M0609 Rheumatoid arthritis without rheumatoid factor, multiple sites: Secondary | ICD-10-CM | POA: Diagnosis not present

## 2014-02-21 NOTE — Telephone Encounter (Signed)
Hydroxyzine PA is denied because of insomnia diagnosis.

## 2014-02-23 ENCOUNTER — Other Ambulatory Visit: Payer: Self-pay | Admitting: *Deleted

## 2014-02-23 ENCOUNTER — Other Ambulatory Visit (INDEPENDENT_AMBULATORY_CARE_PROVIDER_SITE_OTHER): Payer: Medicare Other

## 2014-02-23 DIAGNOSIS — E875 Hyperkalemia: Secondary | ICD-10-CM

## 2014-02-23 LAB — BASIC METABOLIC PANEL
BUN: 25 mg/dL — ABNORMAL HIGH (ref 6–23)
CALCIUM: 9.1 mg/dL (ref 8.4–10.5)
CO2: 26 mEq/L (ref 19–32)
Chloride: 105 mEq/L (ref 96–112)
Creatinine, Ser: 1.2 mg/dL (ref 0.4–1.2)
GFR: 44.91 mL/min — ABNORMAL LOW (ref >60.00)
Glucose, Bld: 97 mg/dL (ref 70–99)
Potassium: 4.6 mEq/L (ref 3.5–5.1)
SODIUM: 138 meq/L (ref 135–145)

## 2014-02-23 NOTE — Telephone Encounter (Signed)
Dr. Charlette Caffey office called stating they checked labs and pt's Kcl is elevated. They believe the specimen was hemolyzed. They want her to come back to our lab and have a recheck. BMET ordered.

## 2014-02-26 ENCOUNTER — Telehealth: Payer: Self-pay | Admitting: Internal Medicine

## 2014-02-26 NOTE — Telephone Encounter (Signed)
Is requesting results of labs from last week.  Patient states can not find on mychart.

## 2014-02-26 NOTE — Telephone Encounter (Signed)
Labs are ok Thx

## 2014-02-26 NOTE — Telephone Encounter (Signed)
Notified pt with md response. Wanting copy sent to Dr. Dora Sims...Diane Abbott

## 2014-03-27 ENCOUNTER — Encounter: Payer: Self-pay | Admitting: Internal Medicine

## 2014-03-27 ENCOUNTER — Ambulatory Visit (INDEPENDENT_AMBULATORY_CARE_PROVIDER_SITE_OTHER): Payer: Medicare Other | Admitting: Internal Medicine

## 2014-03-27 VITALS — BP 110/78 | HR 89 | Temp 98.0°F | Wt 161.0 lb

## 2014-03-27 DIAGNOSIS — Z952 Presence of prosthetic heart valve: Secondary | ICD-10-CM

## 2014-03-27 DIAGNOSIS — G4700 Insomnia, unspecified: Secondary | ICD-10-CM | POA: Diagnosis not present

## 2014-03-27 DIAGNOSIS — Z954 Presence of other heart-valve replacement: Secondary | ICD-10-CM

## 2014-03-27 DIAGNOSIS — M069 Rheumatoid arthritis, unspecified: Secondary | ICD-10-CM

## 2014-03-27 MED ORDER — TRIAZOLAM 0.125 MG PO TABS: 0.1250 mg | ORAL_TABLET | Freq: Every evening | ORAL | Status: DC | PRN

## 2014-03-27 MED ORDER — OXYCODONE HCL ER 10 MG PO T12A: 10.0000 mg | EXTENDED_RELEASE_TABLET | Freq: Two times a day (BID) | ORAL | Status: DC

## 2014-03-27 NOTE — Assessment & Plan Note (Signed)
Continue with current prescription therapy as reflected on the Med list.  

## 2014-03-27 NOTE — Assessment & Plan Note (Signed)
Doing well 

## 2014-03-27 NOTE — Assessment & Plan Note (Signed)
Vistaril is not working; pt wants to re-try a low dose Halcion

## 2014-03-27 NOTE — Assessment & Plan Note (Signed)
Continue with current prescription Pain meds therapy as reflected on the Med list.

## 2014-03-27 NOTE — Progress Notes (Signed)
Pre visit review using our clinic review tool, if applicable. No additional management support is needed unless otherwise documented below in the visit note. 

## 2014-03-27 NOTE — Progress Notes (Signed)
Subjective:      HPI    C/o insomnia, Vistaril is not working; pt wants to re-try a low dose Halcion F/u vag dryness and pt would like to re-start Vagifem pv qod as before (per her GYN)  F/u R CTS  and neck pain - Dr Joya Salm is planning to do surgeries: she had a myelogram   S/p thor AA and AR recently repaired. AVR is repaired Dr Cyndia Bent - she is c/o scar area irritation/pain  S/p L shoulder replacement 12/07/11 by Dr Mardelle Matte. F/u pain in L arm - better   The patient is here to follow up on chronic depression, anxiety, headaches and chronic moderate fibromyalgia symptoms controlled with medicines, stable. She is on Sympony for RA q 2 mo  Hematol said - no need for anticoagulation  Wt Readings from Last 3 Encounters:  03/27/14 161 lb (73.029 kg)  02/08/14 164 lb 6.4 oz (74.571 kg)  01/24/14 165 lb (74.844 kg)   BP Readings from Last 3 Encounters:  03/27/14 110/78  02/08/14 142/76  01/24/14 118/72   Past Medical History  Diagnosis Date  . Fibromyalgia   . Pulmonary embolism   . RA (rheumatoid arthritis)   . Vitamin B 12 deficiency   . Vitamin D deficiency   . Osteoarthritis   . Osteopenia   . Adrenal insufficiency   . IBS (irritable bowel syndrome)   . History of blood clots 2008    below knee  . Diabetes mellitus type II     pt denies this 03-05-11  . Esophagitis   . Depression   . GERD (gastroesophageal reflux disease)   . Connective tissue disorder   . Cataracts, bilateral     more in left than right  . Osteoarthritis of left shoulder 12/07/2011  . Heart murmur   . Leaky heart valve   . Anginal pain     "comes and goes has occured since 2012"  . Bronchitis Jan 2013-March 2013    severe  . Shortness of breath     comes from aneurysm  . Vertigo     hx of  . Urinary leakage     when coughing  . Sliding hiatal hernia   . Fatty liver    Past Surgical History  Procedure Laterality Date  . Abdominal hysterectomy    . Total knee arthroplasty       bilateral  . Cholecystectomy  2003  . Tonsillectomy  1941  . Appendectomy  1995  . Wrist surgery      bilateral  . Bilateral oophorectomy    . Tummy tuck    . Cosmetic surgery      Tummy tuck  . Total shoulder arthroplasty  12/07/2011    Procedure: TOTAL SHOULDER ARTHROPLASTY;  Surgeon: Johnny Bridge, MD;  Location: [place];  Service: Orthopedics;  Laterality: Left;  left total shoulder artthroplasty  . Joint replacement Bilateral   . Cardiac catheterization  09/15/12    no PCI  . Colonoscopy    . Breast biopsy Left   . Skin cancer excision Right     cheek  . Eye surgery Bilateral     Cataract  . Bentall procedure N/A 09/29/2012    Procedure: BENTALL PROCEDURE;  Surgeon: Gaye Pollack, MD;  Location: [place];  Service: Open Heart Surgery;  Laterality: N/A;  WITH CIRC ARREST  . Intraoperative transesophageal echocardiogram N/A 09/29/2012    Procedure: INTRAOPERATIVE TRANSESOPHAGEAL ECHOCARDIOGRAM;  Surgeon: Gaye Pollack, MD;  Location: [facility];  Service: Open Heart Surgery;  Laterality: N/A;  . Aorta surgery      reports that she has never smoked. She has never used smokeless tobacco. She reports that she drinks alcohol. She reports that she does not use illicit drugs. family history includes Breast cancer in an other family member; COPD in her father; Clotting disorder in an other family member; Diabetes in her mother; Heart disease in her maternal grandmother; Ovarian cancer in her mother; Pancreatic cancer in an other family member. There is no history of Colon cancer. Allergies  Allergen Reactions  . Codeine Other (See Comments)    Blood pressure drops; "I pass out."  . Pramipexole Dihydrochloride Nausea Only    sick, falling  . Rofecoxib Other (See Comments)    "sleep walks"  . Arava [Leflunomide] Swelling  . Clonazepam   . Diclofenac Sodium   . Venlafaxine   . Alprazolam Other (See Comments)    "Makes me mean"  . Darvocet [Propoxyphene N-Acetaminophen] Other (See Comments)     "makes my eyes dilate. Pt stated I can't see"  . Duloxetine Other (See Comments)    achy legs  . Gabapentin Other (See Comments)    headache  . Latex Itching and Dermatitis    When examined with latex gloves, burns and itches in contact areas per pt.  . Morphine And Related Other (See Comments)    Hallucinations   . Nortriptyline Hcl Other (See Comments)    Migraines   . Penicillins Rash    Can take Cephalosporins  . Prednisone Swelling    Just doesn't want to take it.   Current Outpatient Prescriptions on File Prior to Visit  Medication Sig Dispense Refill  . aspirin EC 81 MG EC tablet Take 1 tablet (81 mg total) by mouth daily.    . Estradiol (VAGIFEM) 10 MCG TABS vaginal tablet INSERT ONE TABLET VAGINALLY EVERY 2 (TWO) DAYS 15 tablet 2  . fluconazole (DIFLUCAN) 100 MG tablet Take 1 tablet (100 mg total) by mouth as directed. 11 tablet 0  . loratadine (CLARITIN) 10 MG tablet Take 10 mg by mouth daily.    Marland Kitchen NEXIUM 40 MG capsule TAKE ONE CAPSULE BY MOUTH TWICE DAILY 60 capsule 0  . Probiotic Product (PROBIOTIC DAILY PO) Take by mouth daily.    Marland Kitchen rOPINIRole (REQUIP) 2 MG tablet TAKE ONE TABLET BY MOUTH THREE TIMES DAILY 90 tablet 5  . triamcinolone cream (KENALOG) 0.5 % Apply 1 application topically 3 (three) times daily. 45 g 1   No current facility-administered medications on file prior to visit.      Review of Systems  Constitutional: Negative for activity change, appetite change, fatigue and unexpected weight change.  HENT: Positive for sinus pressure. Negative for congestion and mouth sores.   Eyes: Negative for visual disturbance.  Respiratory: Negative for chest tightness.   Gastrointestinal: Negative for nausea and abdominal pain.  Genitourinary: Negative for frequency, difficulty urinating and vaginal pain.  Musculoskeletal: Positive for back pain and arthralgias. Negative for gait problem.  Skin: Negative for pallor.  Neurological: Negative for dizziness,  tremors, weakness and numbness.  Psychiatric/Behavioral: Negative for confusion and sleep disturbance.   BP 110/78 mmHg  Pulse 89  Temp(Src) 98 F (36.7 C) (Oral)  Wt 161 lb (73.029 kg)  SpO2 95%     Objective:   Physical Examkeloid looking scar, tender - chest R knee is bruised R nose AKs  Lab Results  Component Value Date   WBC 3.6* 12/14/2013  HGB 12.8 12/14/2013   HCT 38.0 12/14/2013   PLT 122.0* 12/14/2013   GLUCOSE 97 02/23/2014   ALT 14 12/14/2013   AST 23 12/14/2013   NA 138 02/23/2014   K 4.6 02/23/2014   CL 105 02/23/2014   CREATININE 1.2 02/23/2014   BUN 25* 02/23/2014   CO2 26 02/23/2014   TSH 2.88 01/09/2011   INR 1.34 09/29/2012   HGBA1C 5.7* 09/27/2012         Assessment & Plan:

## 2014-04-04 ENCOUNTER — Other Ambulatory Visit: Payer: Self-pay | Admitting: Internal Medicine

## 2014-04-06 NOTE — Telephone Encounter (Signed)
Called pharmacy spoke with Diane Abbott gave her md authorization...Diane Abbott

## 2014-04-06 NOTE — Telephone Encounter (Signed)
Pt called in and requesting refill for triazolam (HALCION) 0.125 MG tablet [Pharmacy Med Name: TRIAZOLAM 0.125MG   TAB]   She would like it to [place] on [address]

## 2014-05-10 ENCOUNTER — Encounter (HOSPITAL_COMMUNITY): Payer: Medicare Other

## 2014-05-14 ENCOUNTER — Other Ambulatory Visit (HOSPITAL_COMMUNITY): Payer: Self-pay | Admitting: Rheumatology

## 2014-05-14 ENCOUNTER — Encounter (HOSPITAL_COMMUNITY): Admission: RE | Admit: 2014-05-14 | Payer: Medicare Other | Source: Ambulatory Visit

## 2014-05-21 ENCOUNTER — Encounter (HOSPITAL_COMMUNITY): Payer: Self-pay

## 2014-05-21 ENCOUNTER — Encounter (HOSPITAL_COMMUNITY)
Admission: RE | Admit: 2014-05-21 | Discharge: 2014-05-21 | Disposition: A | Discharge status: Home / Self Care | Payer: Medicare Other | Source: Ambulatory Visit | Attending: Rheumatology | Admitting: Rheumatology

## 2014-05-21 DIAGNOSIS — M069 Rheumatoid arthritis, unspecified: Secondary | ICD-10-CM | POA: Diagnosis not present

## 2014-05-21 MED ORDER — SODIUM CHLORIDE 0.9 % IV SOLN
INTRAVENOUS | Status: DC
  Administered 2014-05-21: 250 mL via INTRAVENOUS

## 2014-05-21 MED ORDER — DIPHENHYDRAMINE HCL 25 MG PO TABS
25.0000 mg | ORAL_TABLET | ORAL | Status: DC
  Filled 2014-05-21: qty 1

## 2014-05-21 MED ORDER — SODIUM CHLORIDE 0.9 % IV SOLN
150.0000 mg | INTRAVENOUS | Status: DC
  Administered 2014-05-21: 150 mg via INTRAVENOUS
  Filled 2014-05-21: qty 12

## 2014-05-21 MED ORDER — ACETAMINOPHEN 325 MG PO TABS
650.0000 mg | ORAL_TABLET | ORAL | Status: DC
  Administered 2014-05-21: 650 mg via ORAL
  Filled 2014-05-21: qty 2

## 2014-05-21 NOTE — Progress Notes (Signed)
Uneventful infusion of Simponi Aria today. Pt states she" has been receiving it at the Dr's office for over a year" and" this is her first infusion at [facility]

## 2014-05-21 NOTE — Discharge Instructions (Signed)
SIMPONI Golimumab injection What is this medicine? GOLIMUMAB (goe LIM ue mab) is used to treat rheumatoid arthritis, psoriatic arthritis, ulcerative colitis, and ankylosing spondylitis. This medicine may be used for other purposes; ask your health care provider or pharmacist if you have questions. COMMON BRAND NAME(S): Simponi, SIMPONI ARIA What should I tell my health care provider before I take this medicine? They need to know if you have any of these conditions: -cancer -diabetes -heart disease -hepatitis B or history of hepatitis B infection -immune system problems -infection or history of infections -low blood counts like low white cell, platelet, or red cell counts -multiple sclerosis -recently received or scheduled to receive a vaccine -scheduled to have surgery -tuberculosis, a positive skin test for tuberculosis or have recently been in close contact with someone who has tuberculosis -an unusual reaction to golimumab, other medicines, latex, rubber, foods, dyes, or preservatives -pregnant or trying to get pregnant -breast-feeding How should I use this medicine? This medicine is for injection under the skin. You will be taught how to prepare and give this medicine. Use exactly as directed. Take your medicine at regular intervals. Do not take your medicine more often than directed. More information is available by calling [phone]. It is important that you put your used needles and syringes in a special sharps container. Do not put them in a trash can. If you do not have a sharps container, call your pharmacist or healthcare provider to get one. A special MedGuide will be given to you by the pharmacist with each prescription and refill. Be sure to read this information carefully each time. Talk to your pediatrician regarding the use of this medicine in children. Special care may be needed. Overdosage: If you think you've taken too much of this medicine contact a poison  control center or emergency room at once. Overdosage: If you think you have taken too much of this medicine contact a poison control center or emergency room at once. NOTE: This medicine is only for you. Do not share this medicine with others. What if I miss a dose? If you miss a dose, take it as soon as you can. If it is almost time for your next dose, take only that dose. Do not take double or extra doses. Call your doctor or health care professional if you are not sure how to handle a missed dose. What may interact with this medicine? Do not take this medicine with any of the following medications: -abatacept -adalimumab -anakinra -certolizumab -etanercept -infliximab -live virus vaccines -rilonacept This medicine may also interact with the following medications: -cyclosporine -rituximab -theophylline -vaccines -warfarin This list may not describe all possible interactions. Give your health care provider a list of all the medicines, herbs, non-prescription drugs, or dietary supplements you use. Also tell them if you smoke, drink alcohol, or use illegal drugs. Some items may interact with your medicine. What should I watch for while using this medicine? Visit your doctor or health care professional for regular checks on your progress. Tell your doctor or healthcare professional if your symptoms do not start to get better or if they get worse. You will be tested for tuberculosis (TB) before you start this medicine. If your doctor prescribes any medicine for TB, you should start taking the TB medicine before starting this medicine. Make sure to finish the full course of TB medicine. Call your doctor or health care professional if you get a cold or other infection while receiving this medicine. Do not treat yourself.  This medicine may decrease your body's ability to fight infection. Talk to your doctor about your risk of cancer. You may be more at risk for certain types of cancers if you  take this medicine. What side effects may I notice from receiving this medicine? Side effects that you should report to your doctor or health care professional as soon as possible: -allergic reactions like skin rash, itching or hives, swelling of the face, lips, or tongue -breathing problems -changes in vision -chest pain -dark urine -fever, chills, or any other sign of infection -light-colored stools -muscle pain or weakness -numbness or tingling -red, scaly patches or raised bumps on the skin -right upper belly pain -swelling of the ankles -swollen lymph nodes in the neck, underarm, or groin areas -unexplained weight loss -unusual bleeding or bruising -unusually weak or tired -yellowing of the eyes or skin Side effects that usually do not require medical attention (report to your doctor or health care professional if they continue or are bothersome): -dizziness -nausea -redness, itching, swelling, or bruising at site where injected This list may not describe all possible side effects. Call your doctor for medical advice about side effects. You may report side effects to FDA at 1-800-FDA-1088. Where should I keep my medicine? Keep out of the reach of children. Store in the original container and in the refrigerator between 2 and 8 degrees C (36 and 46 degrees F). Do not freeze. Protect from light. Throw away any unused medicine after the expiration date. NOTE: This sheet is a summary. It may not cover all possible information. If you have questions about this medicine, talk to your doctor, pharmacist, or health care provider.  2015, Elsevier/Gold Standard. (2011-11-24 13:41:25) Rheumatoid Arthritis Rheumatoid arthritis is a long-term (chronic) inflammatory disease that causes pain, swelling, and stiffness of the joints. It can affect the entire body, including the eyes and lungs. The effects of rheumatoid arthritis vary widely among those with the condition. CAUSES  The cause of  rheumatoid arthritis is not known. It tends to run in families and is more common in women. Certain cells of the body's natural defense system (immune system) do not work properly and begin to attack healthy joints. It primarily involves the connective tissue that lines the joints (synovial membrane). This can cause damage to the joint. SYMPTOMS   Pain, stiffness, swelling, and decreased motion of many joints, especially in the hands and feet.  Stiffness that is worse in the morning. It may last 1-2 hours or longer.  Numbness and tingling in the hands.  Fatigue.  Loss of appetite.  Weight loss.  Low-grade fever.  Dry eyes and mouth.  Firm lumps (rheumatoid nodules) that grow beneath the skin in areas such as the elbows and hands. DIAGNOSIS  Diagnosis is based on the symptoms described, an exam, and blood tests. Sometimes, X-rays are helpful. TREATMENT  The goals of treatment are to relieve pain, reduce inflammation, and to slow down or stop joint damage and disability. Methods vary and may include:  Maintaining a balance of rest, exercise, and proper nutrition.  Medicines:  Pain relievers (analgesics).  Corticosteroids and nonsteroidal anti-inflammatory drugs (NSAIDs) to reduce inflammation.  Disease-modifying antirheumatic drugs (DMARDs) to try to slow the course of the disease.  Biologic response modifiers to reduce inflammation and damage.  Physical therapy and occupational therapy.  Surgery for patients with severe joint damage. Joint replacement or fusing of joints may be needed.  Routine monitoring and ongoing care, such as office visits, blood and  urine tests, and X-rays. HOME CARE INSTRUCTIONS   Remain physically active and reduce activity when the disease gets worse.  Eat a well-balanced diet.  Put heat on affected joints when you wake up and before activities. Keep the heat on the affected joint for as long as directed by your health care provider.  Put ice  on affected joints following activities or exercising.  Put ice in a plastic bag.  Place a towel between your skin and the bag.  Leave the ice on for 15-20 minutes, 3-4 times per day, or as directed by your health care provider.  Take medicines and supplements only as directed by your health care provider.  Use splints as directed by your health care provider. Splints help maintain joint position and function.  Do not sleep with pillows under your knees. This may lead to spasms.  Participate in a self-management program to keep current with the latest treatment and coping skills. SEEK IMMEDIATE MEDICAL CARE IF:  You have fainting episodes.  You have periods of extreme weakness.  You rapidly develop a hot, painful joint that is more severe than usual joint aches.  You have chills.  You have a fever. FOR MORE INFORMATION   American College of Rheumatology: www.rheumatology.Register: www.arthritis.org Document Released: 04/17/2000 Document Revised: 09/04/2013 Document Reviewed: 05/27/2011 [facility] Patient Information 2015 [place], [place]. This information is not intended to replace advice given to you by your health care provider. Make sure you discuss any questions you have with your health care provider.

## 2014-05-29 ENCOUNTER — Ambulatory Visit: Payer: Medicare Other | Admitting: Internal Medicine

## 2014-05-30 ENCOUNTER — Encounter: Payer: Self-pay | Admitting: Internal Medicine

## 2014-05-30 ENCOUNTER — Ambulatory Visit (INDEPENDENT_AMBULATORY_CARE_PROVIDER_SITE_OTHER): Payer: Medicare Other | Admitting: Internal Medicine

## 2014-05-30 ENCOUNTER — Other Ambulatory Visit: Payer: Self-pay | Admitting: *Deleted

## 2014-05-30 VITALS — BP 130/86 | HR 83 | Temp 97.6°F | Wt 161.0 lb

## 2014-05-30 DIAGNOSIS — M069 Rheumatoid arthritis, unspecified: Secondary | ICD-10-CM

## 2014-05-30 DIAGNOSIS — J439 Emphysema, unspecified: Secondary | ICD-10-CM

## 2014-05-30 DIAGNOSIS — M609 Myositis, unspecified: Secondary | ICD-10-CM

## 2014-05-30 DIAGNOSIS — IMO0001 Reserved for inherently not codable concepts without codable children: Secondary | ICD-10-CM

## 2014-05-30 DIAGNOSIS — M791 Myalgia: Secondary | ICD-10-CM

## 2014-05-30 MED ORDER — OXYCODONE HCL ER 10 MG PO T12A: 10.0000 mg | EXTENDED_RELEASE_TABLET | Freq: Two times a day (BID) | ORAL | Status: DC

## 2014-05-30 MED ORDER — DIAZEPAM 2 MG PO TABS: 2.0000 mg | ORAL_TABLET | Freq: Two times a day (BID) | ORAL | Status: DC | PRN

## 2014-05-30 MED ORDER — ROPINIROLE HCL 2 MG PO TABS: ORAL_TABLET | ORAL | Status: DC

## 2014-05-30 NOTE — Progress Notes (Signed)
Subjective:      HPI   Brother died 1 wk ago w/pneumonia and ?PE  F/u insomnia, Vistaril is not working; pt wants to re-try a low dose Halcion F/u vag dryness and pt would like to re-start Vagifem pv qod as before (per her GYN)  F/u R CTS  and neck pain - Dr Joya Salm is planning to do surgeries: she had a myelogram   S/p thor AA and AR recently repaired. AVR is repaired Dr Cyndia Bent - she is c/o scar area irritation/pain  S/p L shoulder replacement 12/07/11 by Dr Mardelle Matte. F/u pain in L arm - better   The patient is here to follow up on chronic depression, anxiety, headaches and chronic moderate fibromyalgia symptoms controlled with medicines, stable. She is on Sympony for RA q 2 mo  Hematol said - no need for anticoagulation  Wt Readings from Last 3 Encounters:  05/30/14 161 lb (73.029 kg)  03/27/14 161 lb (73.029 kg)  02/08/14 164 lb 6.4 oz (74.571 kg)   BP Readings from Last 3 Encounters:  05/30/14 130/86  03/27/14 110/78  02/08/14 142/76   Past Medical History  Diagnosis Date  . Fibromyalgia   . Pulmonary embolism   . RA (rheumatoid arthritis)   . Vitamin B 12 deficiency   . Vitamin D deficiency   . Osteoarthritis   . Osteopenia   . Adrenal insufficiency   . IBS (irritable bowel syndrome)   . History of blood clots 2008    below knee  . Diabetes mellitus type II     pt denies this 03-05-11  . Esophagitis   . Depression   . GERD (gastroesophageal reflux disease)   . Connective tissue disorder   . Cataracts, bilateral     more in left than right  . Osteoarthritis of left shoulder 12/07/2011  . Heart murmur   . Leaky heart valve   . Anginal pain     "comes and goes has occured since 2012"  . Bronchitis Jan 2013-March 2013    severe  . Shortness of breath     comes from aneurysm  . Vertigo     hx of  . Urinary leakage     when coughing  . Sliding hiatal hernia   . Fatty liver    Past Surgical History  Procedure Laterality Date  . Abdominal hysterectomy     . Total knee arthroplasty      bilateral  . Cholecystectomy  2003  . Tonsillectomy  1941  . Appendectomy  1995  . Wrist surgery      bilateral  . Bilateral oophorectomy    . Tummy tuck    . Cosmetic surgery      Tummy tuck  . Total shoulder arthroplasty  12/07/2011    Procedure: TOTAL SHOULDER ARTHROPLASTY;  Surgeon: Johnny Bridge, MD;  Location: [place];  Service: Orthopedics;  Laterality: Left;  left total shoulder artthroplasty  . Joint replacement Bilateral   . Cardiac catheterization  09/15/12    no PCI  . Colonoscopy    . Breast biopsy Left   . Skin cancer excision Right     cheek  . Eye surgery Bilateral     Cataract  . Bentall procedure N/A 09/29/2012    Procedure: BENTALL PROCEDURE;  Surgeon: Gaye Pollack, MD;  Location: [place];  Service: Open Heart Surgery;  Laterality: N/A;  WITH CIRC ARREST  . Intraoperative transesophageal echocardiogram N/A 09/29/2012    Procedure: INTRAOPERATIVE TRANSESOPHAGEAL ECHOCARDIOGRAM;  Surgeon: Gaye Pollack, MD;  Location: [place];  Service: Open Heart Surgery;  Laterality: N/A;  . Aorta surgery  12/2012    reports that she has never smoked. She has never used smokeless tobacco. She reports that she drinks alcohol. She reports that she does not use illicit drugs. family history includes Breast cancer in an other family member; COPD in her father; Clotting disorder in an other family member; Diabetes in her mother; Heart disease in her maternal grandmother; Ovarian cancer in her mother; Pancreatic cancer in an other family member. There is no history of Colon cancer. Allergies  Allergen Reactions  . Codeine Other (See Comments)    Blood pressure drops; "I pass out."  . Pramipexole Dihydrochloride Nausea Only    sick, falling  . Rofecoxib Other (See Comments)    "sleep walks"  . Arava [Leflunomide] Swelling  . Clonazepam   . Diclofenac Sodium   . Venlafaxine   . Alprazolam Other (See Comments)    "Makes me mean"  . Darvocet  [Propoxyphene N-Acetaminophen] Other (See Comments)    "makes my eyes dilate. Pt stated I can't see"  . Duloxetine Other (See Comments)    achy legs  . Gabapentin Other (See Comments)    headache  . Latex Itching and Dermatitis    When examined with latex gloves, burns and itches in contact areas per pt.  . Morphine And Related Other (See Comments)    Hallucinations   . Nortriptyline Hcl Other (See Comments)    Migraines   . Penicillins Rash    Can take Cephalosporins  . Prednisone Swelling    Just doesn't want to take it.   Current Outpatient Prescriptions on File Prior to Visit  Medication Sig Dispense Refill  . aspirin EC 81 MG EC tablet Take 1 tablet (81 mg total) by mouth daily.    . Estradiol (VAGIFEM) 10 MCG TABS vaginal tablet INSERT ONE TABLET VAGINALLY EVERY 2 (TWO) DAYS 15 tablet 2  . fluconazole (DIFLUCAN) 100 MG tablet Take 1 tablet (100 mg total) by mouth as directed. 11 tablet 0  . loratadine (CLARITIN) 10 MG tablet Take 10 mg by mouth daily.    Marland Kitchen NEXIUM 40 MG capsule TAKE ONE CAPSULE BY MOUTH TWICE DAILY 60 capsule 0  . OxyCODONE (OXYCONTIN) 10 mg T12A 12 hr tablet Take 1 tablet (10 mg total) by mouth every 12 (twelve) hours. Please fill on or after 04/26/14 60 tablet 0  . rOPINIRole (REQUIP) 2 MG tablet TAKE ONE TABLET BY MOUTH THREE TIMES DAILY 90 tablet 5  . triazolam (HALCION) 0.125 MG tablet TAKE ONE TABLET BY MOUTH AT BEDTIME AS NEEDED 30 tablet 3  . Probiotic Product (PROBIOTIC DAILY PO) Take by mouth daily.    Marland Kitchen triamcinolone cream (KENALOG) 0.5 % Apply 1 application topically 3 (three) times daily. (Patient not taking: Reported on 05/30/2014) 45 g 1   No current facility-administered medications on file prior to visit.      Review of Systems  Constitutional: Negative for activity change, appetite change, fatigue and unexpected weight change.  HENT: Positive for sinus pressure. Negative for congestion and mouth sores.   Eyes: Negative for visual  disturbance.  Respiratory: Negative for chest tightness.   Gastrointestinal: Negative for nausea and abdominal pain.  Genitourinary: Negative for frequency, difficulty urinating and vaginal pain.  Musculoskeletal: Positive for back pain and arthralgias. Negative for gait problem.  Skin: Negative for pallor.  Neurological: Negative for dizziness, tremors, weakness and numbness.  Psychiatric/Behavioral: Negative for confusion and sleep disturbance.   BP 130/86 mmHg  Pulse 83  Temp(Src) 97.6 F (36.4 C) (Oral)  Wt 161 lb (73.029 kg)  SpO2 97%     Objective:   Physical Exam   keloid looking scar, tender - chest R knee is bruised R nose AKs  Lab Results  Component Value Date   WBC 3.6* 12/14/2013   HGB 12.8 12/14/2013   HCT 38.0 12/14/2013   PLT 122.0* 12/14/2013   GLUCOSE 97 02/23/2014   ALT 14 12/14/2013   AST 23 12/14/2013   NA 138 02/23/2014   K 4.6 02/23/2014   CL 105 02/23/2014   CREATININE 1.2 02/23/2014   BUN 25* 02/23/2014   CO2 26 02/23/2014   TSH 2.88 01/09/2011   INR 1.34 09/29/2012   HGBA1C 5.7* 09/27/2012         Assessment & Plan:

## 2014-05-30 NOTE — Assessment & Plan Note (Signed)
Continue with current prescription therapy as reflected on the Med list.  

## 2014-05-30 NOTE — Progress Notes (Signed)
Pre visit review using our clinic review tool, if applicable. No additional management support is needed unless otherwise documented below in the visit note. 

## 2014-05-30 NOTE — Assessment & Plan Note (Signed)
Chronic FMS/CTD, severe  Potential benefits of a long term opioids use as well as potential risks (i.e. addiction risk, apnea etc) and complications (i.e. Somnolence, constipation and others) were explained to the patient and were aknowledged.

## 2014-06-12 ENCOUNTER — Other Ambulatory Visit: Payer: Self-pay | Admitting: Internal Medicine

## 2014-06-14 DIAGNOSIS — M25511 Pain in right shoulder: Secondary | ICD-10-CM | POA: Diagnosis not present

## 2014-06-14 DIAGNOSIS — D709 Neutropenia, unspecified: Secondary | ICD-10-CM | POA: Diagnosis not present

## 2014-06-14 DIAGNOSIS — M069 Rheumatoid arthritis, unspecified: Secondary | ICD-10-CM | POA: Diagnosis not present

## 2014-06-14 DIAGNOSIS — M797 Fibromyalgia: Secondary | ICD-10-CM | POA: Diagnosis not present

## 2014-07-04 ENCOUNTER — Other Ambulatory Visit: Payer: Self-pay | Admitting: Internal Medicine

## 2014-07-04 DIAGNOSIS — Z79899 Other long term (current) drug therapy: Secondary | ICD-10-CM | POA: Diagnosis not present

## 2014-07-16 ENCOUNTER — Encounter (HOSPITAL_COMMUNITY)
Admission: RE | Admit: 2014-07-16 | Discharge: 2014-07-16 | Disposition: A | Discharge status: Home / Self Care | Payer: Medicare Other | Source: Ambulatory Visit | Attending: Rheumatology | Admitting: Rheumatology

## 2014-07-16 ENCOUNTER — Encounter (HOSPITAL_COMMUNITY): Payer: Self-pay

## 2014-07-16 DIAGNOSIS — M069 Rheumatoid arthritis, unspecified: Secondary | ICD-10-CM | POA: Insufficient documentation

## 2014-07-16 MED ORDER — SODIUM CHLORIDE 0.9 % IV SOLN
150.0000 mg | INTRAVENOUS | Status: AC
  Administered 2014-07-16: 150 mg via INTRAVENOUS
  Filled 2014-07-16: qty 12

## 2014-07-16 MED ORDER — DIPHENHYDRAMINE HCL 25 MG PO CAPS
25.0000 mg | ORAL_CAPSULE | ORAL | Status: DC
  Filled 2014-07-16: qty 1

## 2014-07-16 MED ORDER — ACETAMINOPHEN 325 MG PO TABS
650.0000 mg | ORAL_TABLET | ORAL | Status: AC
  Administered 2014-07-16: 650 mg via ORAL
  Filled 2014-07-16: qty 2

## 2014-07-16 MED ORDER — SODIUM CHLORIDE 0.9 % IV SOLN
INTRAVENOUS | Status: AC
  Administered 2014-07-16: 09:00:00 via INTRAVENOUS

## 2014-07-16 NOTE — Progress Notes (Signed)
simponi-aria infusion tolerated well.  Vss, afebrile.  Pt d/c home ambulatory.

## 2014-07-26 ENCOUNTER — Ambulatory Visit: Payer: Medicare Other | Admitting: Internal Medicine

## 2014-08-29 ENCOUNTER — Encounter: Payer: Self-pay | Admitting: Internal Medicine

## 2014-08-29 ENCOUNTER — Ambulatory Visit (INDEPENDENT_AMBULATORY_CARE_PROVIDER_SITE_OTHER): Payer: Medicare Other | Admitting: Internal Medicine

## 2014-08-29 VITALS — BP 144/80 | HR 80 | Wt 163.0 lb

## 2014-08-29 DIAGNOSIS — F32A Depression, unspecified: Secondary | ICD-10-CM

## 2014-08-29 DIAGNOSIS — E538 Deficiency of other specified B group vitamins: Secondary | ICD-10-CM

## 2014-08-29 DIAGNOSIS — IMO0001 Reserved for inherently not codable concepts without codable children: Secondary | ICD-10-CM

## 2014-08-29 DIAGNOSIS — M791 Myalgia: Secondary | ICD-10-CM | POA: Diagnosis not present

## 2014-08-29 DIAGNOSIS — M069 Rheumatoid arthritis, unspecified: Secondary | ICD-10-CM

## 2014-08-29 DIAGNOSIS — M609 Myositis, unspecified: Secondary | ICD-10-CM

## 2014-08-29 DIAGNOSIS — E559 Vitamin D deficiency, unspecified: Secondary | ICD-10-CM | POA: Diagnosis not present

## 2014-08-29 DIAGNOSIS — F329 Major depressive disorder, single episode, unspecified: Secondary | ICD-10-CM

## 2014-08-29 MED ORDER — OXYCODONE HCL ER 10 MG PO T12A: 10.0000 mg | EXTENDED_RELEASE_TABLET | Freq: Two times a day (BID) | ORAL | Status: DC

## 2014-08-29 NOTE — Assessment & Plan Note (Signed)
Chronic FMS/CTD, severe On Oxycontin  Potential benefits of a long term opioids use as well as potential risks (i.e. addiction risk, apnea etc) and complications (i.e. Somnolence, constipation and others) were explained to the patient and were aknowledged 

## 2014-08-29 NOTE — Assessment & Plan Note (Signed)
Vit D 

## 2014-08-29 NOTE — Progress Notes (Signed)
Subjective:      HPI     F/u insomnia, Vistaril is not working; pt wants to re-try a low dose Halcion F/u vag dryness and pt would like to re-start Vagifem pv qod as before (per her GYN)  F/u R CTS  and neck pain - Dr Joya Salm is planning to do surgeries: she had a myelogram   S/p thor AA and AR recently repaired. AVR is repaired Dr Cyndia Bent - she is c/o scar area irritation/pain  S/p L shoulder replacement 12/07/11 by Dr Mardelle Matte. F/u pain in L arm - better   The patient is here to follow up on chronic depression, anxiety, headaches and chronic moderate fibromyalgia symptoms controlled with medicines, stable. She is on Sympony for RA q 2 mo  Hematol said - no need for anticoagulation  Wt Readings from Last 3 Encounters:  08/29/14 163 lb (73.936 kg)  05/30/14 161 lb (73.029 kg)  03/27/14 161 lb (73.029 kg)   BP Readings from Last 3 Encounters:  08/29/14 144/80  05/30/14 130/86  03/27/14 110/78   Past Medical History  Diagnosis Date  . Fibromyalgia   . Pulmonary embolism   . RA (rheumatoid arthritis)   . Vitamin B 12 deficiency   . Vitamin D deficiency   . Osteoarthritis   . Osteopenia   . Adrenal insufficiency   . IBS (irritable bowel syndrome)   . History of blood clots 2008    below knee  . Diabetes mellitus type II     pt denies this 03-05-11  . Esophagitis   . Depression   . GERD (gastroesophageal reflux disease)   . Connective tissue disorder   . Cataracts, bilateral     more in left than right  . Osteoarthritis of left shoulder 12/07/2011  . Heart murmur   . Leaky heart valve   . Anginal pain     "comes and goes has occured since 2012"  . Bronchitis Jan 2013-March 2013    severe  . Shortness of breath     comes from aneurysm  . Vertigo     hx of  . Urinary leakage     when coughing  . Sliding hiatal hernia   . Fatty liver    Past Surgical History  Procedure Laterality Date  . Abdominal hysterectomy    . Total knee arthroplasty      bilateral   . Cholecystectomy  2003  . Tonsillectomy  1941  . Appendectomy  1995  . Wrist surgery      bilateral  . Bilateral oophorectomy    . Tummy tuck    . Cosmetic surgery      Tummy tuck  . Total shoulder arthroplasty  12/07/2011    Procedure: TOTAL SHOULDER ARTHROPLASTY;  Surgeon: Johnny Bridge, MD;  Location: [facility];  Service: Orthopedics;  Laterality: Left;  left total shoulder artthroplasty  . Joint replacement Bilateral   . Cardiac catheterization  09/15/12    no PCI  . Colonoscopy    . Breast biopsy Left   . Skin cancer excision Right     cheek  . Eye surgery Bilateral     Cataract  . Bentall procedure N/A 09/29/2012    Procedure: BENTALL PROCEDURE;  Surgeon: Gaye Pollack, MD;  Location: [facility];  Service: Open Heart Surgery;  Laterality: N/A;  WITH CIRC ARREST  . Intraoperative transesophageal echocardiogram N/A 09/29/2012    Procedure: INTRAOPERATIVE TRANSESOPHAGEAL ECHOCARDIOGRAM;  Surgeon: Gaye Pollack, MD;  Location: [facility];  Service: Open Heart Surgery;  Laterality: N/A;  . Aorta surgery  12/2012    reports that she has never smoked. She has never used smokeless tobacco. She reports that she drinks alcohol. She reports that she does not use illicit drugs. family history includes Breast cancer in an other family member; COPD in her father; Clotting disorder in an other family member; Diabetes in her mother; Heart disease in her maternal grandmother; Ovarian cancer in her mother; Pancreatic cancer in an other family member. There is no history of Colon cancer. Allergies  Allergen Reactions  . Codeine Other (See Comments)    Blood pressure drops; "I pass out."  . Pramipexole Dihydrochloride Nausea Only    sick, falling  . Rofecoxib Other (See Comments)    "sleep walks"  . Arava [Leflunomide] Swelling  . Clonazepam   . Diclofenac Sodium   . Venlafaxine   . Alprazolam Other (See Comments)    "Makes me mean"  . Darvocet [Propoxyphene N-Acetaminophen] Other (See Comments)     "makes my eyes dilate. Pt stated I can't see"  . Duloxetine Other (See Comments)    achy legs  . Gabapentin Other (See Comments)    headache  . Latex Itching and Dermatitis    When examined with latex gloves, burns and itches in contact areas per pt.  . Morphine And Related Other (See Comments)    Hallucinations   . Nortriptyline Hcl Other (See Comments)    Migraines   . Penicillins Rash    Can take Cephalosporins  . Prednisone Swelling    Just doesn't want to take it.   Current Outpatient Prescriptions on File Prior to Visit  Medication Sig Dispense Refill  . aspirin EC 81 MG EC tablet Take 1 tablet (81 mg total) by mouth daily.    . Estradiol (VAGIFEM) 10 MCG TABS vaginal tablet INSERT ONE TABLET VAGINALLY EVERY 2 (TWO) DAYS 15 tablet 2  . fluconazole (DIFLUCAN) 100 MG tablet Take 1 tablet (100 mg total) by mouth as directed. 11 tablet 0  . loratadine (CLARITIN) 10 MG tablet Take 10 mg by mouth daily.    . meclizine (ANTIVERT) 12.5 MG tablet TAKE ONE TO TWO TABLETS BY MOUTH THREE TIMES DAILY AS NEEDED FOR DIZZINESS 60 tablet 0  . NEXIUM 40 MG capsule TAKE ONE CAPSULE BY MOUTH TWICE DAILY 60 capsule 0  . OxyCODONE (OXYCONTIN) 10 mg T12A 12 hr tablet Take 1 tablet (10 mg total) by mouth every 12 (twelve) hours. Please fill on or after 07/29/14 60 tablet 0  . Probiotic Product (PROBIOTIC DAILY PO) Take by mouth daily.    Marland Kitchen rOPINIRole (REQUIP) 2 MG tablet TAKE ONE TABLET BY MOUTH THREE TIMES DAILY 90 tablet 5  . triamcinolone cream (KENALOG) 0.5 % Apply 1 application topically 3 (three) times daily. 45 g 1  . triazolam (HALCION) 0.125 MG tablet TAKE ONE TABLET BY MOUTH AT BEDTIME AS NEEDED 30 tablet 3  . VAGIFEM 10 MCG TABS vaginal tablet PLACE ONE TABLET VAGINALLY EVERY OTHER DAY 15 tablet 0  . diazepam (VALIUM) 2 MG tablet Take 1 tablet (2 mg total) by mouth every 12 (twelve) hours as needed for anxiety. (Patient not taking: Reported on 08/29/2014) 30 tablet 1   No current  facility-administered medications on file prior to visit.      Review of Systems  Constitutional: Negative for activity change, appetite change, fatigue and unexpected weight change.  HENT: Positive for sinus pressure. Negative for congestion and mouth sores.  Eyes: Negative for visual disturbance.  Respiratory: Negative for chest tightness.   Gastrointestinal: Negative for nausea and abdominal pain.  Genitourinary: Negative for frequency, difficulty urinating and vaginal pain.  Musculoskeletal: Positive for back pain and arthralgias. Negative for gait problem.  Skin: Negative for pallor.  Neurological: Negative for dizziness, tremors, weakness and numbness.  Psychiatric/Behavioral: Negative for confusion and sleep disturbance.   BP 144/80 mmHg  Pulse 80  Wt 163 lb (73.936 kg)  SpO2 96%     Objective:   Physical Exam   keloid looking scar, tender - chest R knee is bruised R nose AKs  Lab Results  Component Value Date   WBC 3.6* 12/14/2013   HGB 12.8 12/14/2013   HCT 38.0 12/14/2013   PLT 122.0* 12/14/2013   GLUCOSE 97 02/23/2014   ALT 14 12/14/2013   AST 23 12/14/2013   NA 138 02/23/2014   K 4.6 02/23/2014   CL 105 02/23/2014   CREATININE 1.2 02/23/2014   BUN 25* 02/23/2014   CO2 26 02/23/2014   TSH 2.88 01/09/2011   INR 1.34 09/29/2012   HGBA1C 5.7* 09/27/2012         Assessment & Plan:  Patient ID: Diane Abbott, female   DOB: [birth year], 80 y.o.   MRN: [id number]

## 2014-08-29 NOTE — Assessment & Plan Note (Signed)
Chronic ?On B12 ?Risks associated with treatment noncompliance were discussed. Compliance was encouraged. ?

## 2014-08-29 NOTE — Progress Notes (Signed)
Pre visit review using our clinic review tool, if applicable. No additional management support is needed unless otherwise documented below in the visit note. 

## 2014-08-29 NOTE — Assessment & Plan Note (Signed)
Doing fair 

## 2014-09-10 ENCOUNTER — Encounter (HOSPITAL_COMMUNITY)
Admission: RE | Admit: 2014-09-10 | Discharge: 2014-09-10 | Disposition: A | Discharge status: Home / Self Care | Payer: Medicare Other | Source: Ambulatory Visit | Attending: Rheumatology | Admitting: Rheumatology

## 2014-09-10 ENCOUNTER — Encounter (HOSPITAL_COMMUNITY): Payer: Self-pay

## 2014-09-10 ENCOUNTER — Other Ambulatory Visit (HOSPITAL_COMMUNITY): Payer: Self-pay | Admitting: Rheumatology

## 2014-09-10 DIAGNOSIS — M069 Rheumatoid arthritis, unspecified: Secondary | ICD-10-CM | POA: Insufficient documentation

## 2014-09-10 LAB — CBC WITH DIFFERENTIAL/PLATELET
BASOS ABS: 0.1 10*3/uL (ref 0.0–0.1)
Basophils Relative: 2 % — ABNORMAL HIGH (ref 0–1)
EOS ABS: 0.1 10*3/uL (ref 0.0–0.7)
EOS PCT: 4 % (ref 0–5)
HEMATOCRIT: 37.4 % (ref 36.0–46.0)
Hemoglobin: 12.5 g/dL (ref 12.0–15.0)
Lymphocytes Relative: 34 % (ref 12–46)
Lymphs Abs: 1 10*3/uL (ref 0.7–4.0)
MCH: 32.1 pg (ref 26.0–34.0)
MCHC: 33.4 g/dL (ref 30.0–36.0)
MCV: 95.9 fL (ref 78.0–100.0)
MONO ABS: 0.3 10*3/uL (ref 0.1–1.0)
Monocytes Relative: 11 % (ref 3–12)
Neutro Abs: 1.5 10*3/uL — ABNORMAL LOW (ref 1.7–7.7)
Neutrophils Relative %: 49 % (ref 43–77)
PLATELETS: 104 10*3/uL — AB (ref 150–400)
RBC: 3.9 MIL/uL (ref 3.87–5.11)
RDW: 13.1 % (ref 11.5–15.5)
WBC: 3 10*3/uL — ABNORMAL LOW (ref 4.0–10.5)

## 2014-09-10 LAB — COMPREHENSIVE METABOLIC PANEL
ALBUMIN: 3.7 g/dL (ref 3.5–5.0)
ALT: 17 U/L (ref 14–54)
AST: 22 U/L (ref 15–41)
Alkaline Phosphatase: 55 U/L (ref 38–126)
Anion gap: 7 (ref 5–15)
BUN: 30 mg/dL — ABNORMAL HIGH (ref 6–20)
CALCIUM: 9 mg/dL (ref 8.9–10.3)
CO2: 26 mmol/L (ref 22–32)
CREATININE: 0.95 mg/dL (ref 0.44–1.00)
Chloride: 109 mmol/L (ref 101–111)
GFR, EST NON AFRICAN AMERICAN: 56 mL/min — AB (ref >60)
Glucose, Bld: 114 mg/dL — ABNORMAL HIGH (ref 70–99)
Potassium: 3.9 mmol/L (ref 3.5–5.1)
SODIUM: 142 mmol/L (ref 135–145)
Total Bilirubin: 0.7 mg/dL (ref 0.3–1.2)
Total Protein: 6.6 g/dL (ref 6.5–8.1)

## 2014-09-10 MED ORDER — SODIUM CHLORIDE 0.9 % IV SOLN
150.0000 mg | INTRAVENOUS | Status: DC
  Administered 2014-09-10: 150 mg via INTRAVENOUS
  Filled 2014-09-10: qty 12

## 2014-09-10 MED ORDER — DIPHENHYDRAMINE HCL 25 MG PO CAPS: 25.0000 mg | ORAL_CAPSULE | ORAL | Status: DC

## 2014-09-10 MED ORDER — SODIUM CHLORIDE 0.9 % IV SOLN
INTRAVENOUS | Status: DC
  Administered 2014-09-10: 250 mL via INTRAVENOUS

## 2014-09-10 MED ORDER — ACETAMINOPHEN 325 MG PO TABS
650.0000 mg | ORAL_TABLET | ORAL | Status: DC
  Administered 2014-09-10: 650 mg via ORAL
  Filled 2014-09-10: qty 2

## 2014-09-10 NOTE — Progress Notes (Signed)
Uneventful infusion of Simponi Aria. Pt ambulated in department after infusion and states she is no longer sleepy from the Benadryl she took at home

## 2014-09-10 NOTE — Discharge Instructions (Signed)
SIMPONI Aria Golimumab injection What is this medicine? GOLIMUMAB (goe LIM ue mab) is used to treat rheumatoid arthritis, psoriatic arthritis, ulcerative colitis, and ankylosing spondylitis. This medicine may be used for other purposes; ask your health care provider or pharmacist if you have questions. COMMON BRAND NAME(S): Simponi, SIMPONI ARIA What should I tell my health care provider before I take this medicine? They need to know if you have any of these conditions: -cancer -diabetes -heart disease -hepatitis B or history of hepatitis B infection -immune system problems -infection or history of infections -low blood counts like low white cell, platelet, or red cell counts -multiple sclerosis -recently received or scheduled to receive a vaccine -scheduled to have surgery -tuberculosis, a positive skin test for tuberculosis or have recently been in close contact with someone who has tuberculosis -an unusual reaction to golimumab, other medicines, latex, rubber, foods, dyes, or preservatives -pregnant or trying to get pregnant -breast-feeding How should I use this medicine? This medicine is for injection under the skin. You will be taught how to prepare and give this medicine. Use exactly as directed. Take your medicine at regular intervals. Do not take your medicine more often than directed. More information is available by calling [phone]. It is important that you put your used needles and syringes in a special sharps container. Do not put them in a trash can. If you do not have a sharps container, call your pharmacist or healthcare provider to get one. A special MedGuide will be given to you by the pharmacist with each prescription and refill. Be sure to read this information carefully each time. Talk to your pediatrician regarding the use of this medicine in children. Special care may be needed. Overdosage: If you think you've taken too much of this medicine contact a poison  control center or emergency room at once. Overdosage: If you think you have taken too much of this medicine contact a poison control center or emergency room at once. NOTE: This medicine is only for you. Do not share this medicine with others. What if I miss a dose? If you miss a dose, take it as soon as you can. If it is almost time for your next dose, take only that dose. Do not take double or extra doses. Call your doctor or health care professional if you are not sure how to handle a missed dose. What may interact with this medicine? Do not take this medicine with any of the following medications: -abatacept -adalimumab -anakinra -certolizumab -etanercept -infliximab -live virus vaccines -rilonacept This medicine may also interact with the following medications: -cyclosporine -rituximab -theophylline -vaccines -warfarin This list may not describe all possible interactions. Give your health care provider a list of all the medicines, herbs, non-prescription drugs, or dietary supplements you use. Also tell them if you smoke, drink alcohol, or use illegal drugs. Some items may interact with your medicine. What should I watch for while using this medicine? Visit your doctor or health care professional for regular checks on your progress. Tell your doctor or healthcare professional if your symptoms do not start to get better or if they get worse. You will be tested for tuberculosis (TB) before you start this medicine. If your doctor prescribes any medicine for TB, you should start taking the TB medicine before starting this medicine. Make sure to finish the full course of TB medicine. Call your doctor or health care professional if you get a cold or other infection while receiving this medicine. Do not treat  yourself. This medicine may decrease your body's ability to fight infection. Talk to your doctor about your risk of cancer. You may be more at risk for certain types of cancers if you  take this medicine. What side effects may I notice from receiving this medicine? Side effects that you should report to your doctor or health care professional as soon as possible: -allergic reactions like skin rash, itching or hives, swelling of the face, lips, or tongue -breathing problems -changes in vision -chest pain -dark urine -fever, chills, or any other sign of infection -light-colored stools -muscle pain or weakness -numbness or tingling -red, scaly patches or raised bumps on the skin -right upper belly pain -swelling of the ankles -swollen lymph nodes in the neck, underarm, or groin areas -unexplained weight loss -unusual bleeding or bruising -unusually weak or tired -yellowing of the eyes or skin Side effects that usually do not require medical attention (report to your doctor or health care professional if they continue or are bothersome): -dizziness -nausea -redness, itching, swelling, or bruising at site where injected This list may not describe all possible side effects. Call your doctor for medical advice about side effects. You may report side effects to FDA at 1-800-FDA-1088. Where should I keep my medicine? Keep out of the reach of children. Store in the original container and in the refrigerator between 2 and 8 degrees C (36 and 46 degrees F). Do not freeze. Protect from light. Throw away any unused medicine after the expiration date. NOTE: This sheet is a summary. It may not cover all possible information. If you have questions about this medicine, talk to your doctor, pharmacist, or health care provider.  2015, Elsevier/Gold Standard. (2011-11-24 13:41:25) Arthritis, Nonspecific Arthritis is inflammation of a joint. This usually means pain, redness, warmth or swelling are present. One or more joints may be involved. There are a number of types of arthritis. Your caregiver may not be able to tell what type of arthritis you have right away. CAUSES  The most  common cause of arthritis is the wear and tear on the joint (osteoarthritis). This causes damage to the cartilage, which can break down over time. The knees, hips, back and neck are most often affected by this type of arthritis. Other types of arthritis and common causes of joint pain include:  Sprains and other injuries near the joint. Sometimes minor sprains and injuries cause pain and swelling that develop hours later.  Rheumatoid arthritis. This affects hands, feet and knees. It usually affects both sides of your body at the same time. It is often associated with chronic ailments, fever, weight loss and general weakness.  Crystal arthritis. Gout and pseudo gout can cause occasional acute severe pain, redness and swelling in the foot, ankle, or knee.  Infectious arthritis. Bacteria can get into a joint through a break in overlying skin. This can cause infection of the joint. Bacteria and viruses can also spread through the blood and affect your joints.  Drug, infectious and allergy reactions. Sometimes joints can become mildly painful and slightly swollen with these types of illnesses. SYMPTOMS   Pain is the main symptom.  Your joint or joints can also be red, swollen and warm or hot to the touch.  You may have a fever with certain types of arthritis, or even feel overall ill.  The joint with arthritis will hurt with movement. Stiffness is present with some types of arthritis. DIAGNOSIS  Your caregiver will suspect arthritis based on your description of  your symptoms and on your exam. Testing may be needed to find the type of arthritis:  Blood and sometimes urine tests.  X-ray tests and sometimes CT or MRI scans.  Removal of fluid from the joint (arthrocentesis) is done to check for bacteria, crystals or other causes. Your caregiver (or a specialist) will numb the area over the joint with a local anesthetic, and use a needle to remove joint fluid for examination. This procedure is only  minimally uncomfortable.  Even with these tests, your caregiver may not be able to tell what kind of arthritis you have. Consultation with a specialist (rheumatologist) may be helpful. TREATMENT  Your caregiver will discuss with you treatment specific to your type of arthritis. If the specific type cannot be determined, then the following general recommendations may apply. Treatment of severe joint pain includes:  Rest.  Elevation.  Anti-inflammatory medication (for example, ibuprofen) may be prescribed. Avoiding activities that cause increased pain.  Only take over-the-counter or prescription medicines for pain and discomfort as recommended by your caregiver.  Cold packs over an inflamed joint may be used for 10 to 15 minutes every hour. Hot packs sometimes feel better, but do not use overnight. Do not use hot packs if you are diabetic without your caregiver's permission.  A cortisone shot into arthritic joints may help reduce pain and swelling.  Any acute arthritis that gets worse over the next 1 to 2 days needs to be looked at to be sure there is no joint infection. Long-term arthritis treatment involves modifying activities and lifestyle to reduce joint stress jarring. This can include weight loss. Also, exercise is needed to nourish the joint cartilage and remove waste. This helps keep the muscles around the joint strong. HOME CARE INSTRUCTIONS   Do not take aspirin to relieve pain if gout is suspected. This elevates uric acid levels.  Only take over-the-counter or prescription medicines for pain, discomfort or fever as directed by your caregiver.  Rest the joint as much as possible.  If your joint is swollen, keep it elevated.  Use crutches if the painful joint is in your leg.  Drinking plenty of fluids may help for certain types of arthritis.  Follow your caregiver's dietary instructions.  Try low-impact exercise such as:  Swimming.  Water  aerobics.  Biking.  Walking.  Morning stiffness is often relieved by a warm shower.  Put your joints through regular range-of-motion. SEEK MEDICAL CARE IF:   You do not feel better in 24 hours or are getting worse.  You have side effects to medications, or are not getting better with treatment. SEEK IMMEDIATE MEDICAL CARE IF:   You have a fever.  You develop severe joint pain, swelling or redness.  Many joints are involved and become painful and swollen.  There is severe back pain and/or leg weakness.  You have loss of bowel or bladder control. Document Released: 05/28/2004 Document Revised: 07/13/2011 Document Reviewed: 06/13/2008 [facility] Patient Information 2015 [place], [place]. This information is not intended to replace advice given to you by your health care provider. Make sure you discuss any questions you have with your health care provider.

## 2014-09-10 NOTE — Progress Notes (Signed)
Pt arrived today for her Simponi infusion. States she has already taken 25mg  benadryl po plus Aleve Cold and Sinus for seasonal allergies. Confirmed with Est[facility] in [place] that it was OK to give her premed of Tylenol 650 mg prior to infusion

## 2014-09-13 DIAGNOSIS — Z954 Presence of other heart-valve replacement: Secondary | ICD-10-CM | POA: Diagnosis not present

## 2014-09-13 DIAGNOSIS — D6489 Other specified anemias: Secondary | ICD-10-CM | POA: Diagnosis not present

## 2014-09-13 DIAGNOSIS — N183 Chronic kidney disease, stage 3 (moderate): Secondary | ICD-10-CM | POA: Diagnosis not present

## 2014-09-13 DIAGNOSIS — Z862 Personal history of diseases of the blood and blood-forming organs and certain disorders involving the immune mechanism: Secondary | ICD-10-CM | POA: Diagnosis not present

## 2014-09-13 DIAGNOSIS — D72818 Other decreased white blood cell count: Secondary | ICD-10-CM | POA: Diagnosis not present

## 2014-09-13 DIAGNOSIS — R312 Other microscopic hematuria: Secondary | ICD-10-CM | POA: Diagnosis not present

## 2014-09-24 IMAGING — RF DG MYELOGRAM CERVICAL
13 series · 13 of 13 positions shown · IV contrast (omnipaque)
Comparison: None.

MYELOGRAM  INJECTION
TECHNIQUE: Informed consent was obtained from the patient prior to
the procedure, including potential complications of headache,
allergy, infection and pain. Specific instructions were given
regarding 24 hour bedrest post procedure to prevent post-LP
headache.  A timeout procedure was performed.  With the patient
prone, the lower back was prepped with Betadine.  1% Lidocaine was
used for local anesthesia.  Lumbar puncture was performed by the
radiologist at the L2-I1level using a 22 gauge needle with return
of clear CSF.  Ninecc of Omnipaque 400was injected into the
subarachnoid space .
CLINICAL DATA: Neck pain.  Right arm pain.
TECHNIQUE: Multidetector CT imaging of the cervical spine was
performed following myelography.  Multiplanar CT image
reconstructions were also generated.

[Series 1: (hospital) · 1 of 1 slices shown (1 of 2)]
[im 1/1]
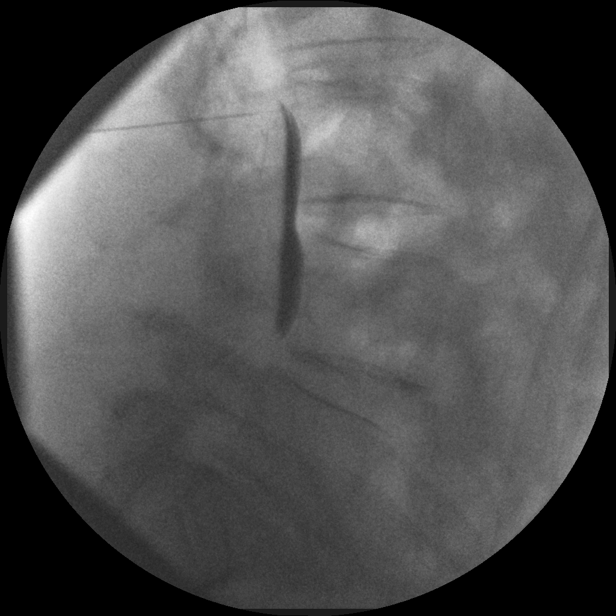

[Series 2: (hospital) · 1 of 1 slices shown (2 of 2)]
[im 1/1]
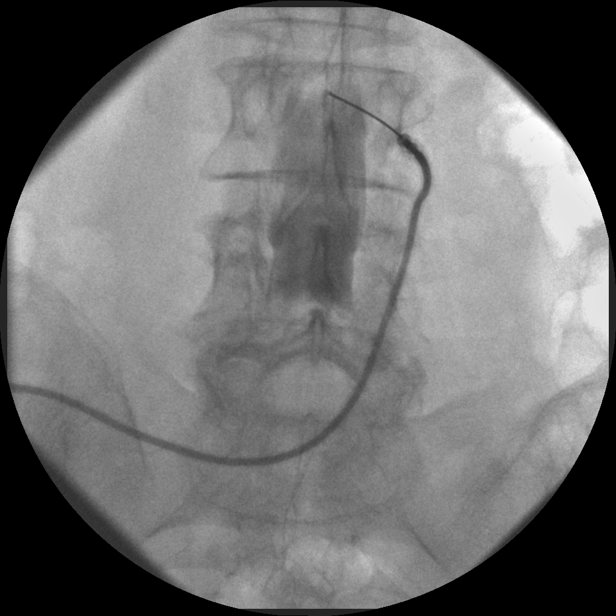

[Series 3: myelogram  white · 1 of 1 slices shown (1 of 8)]
[im 1/1]
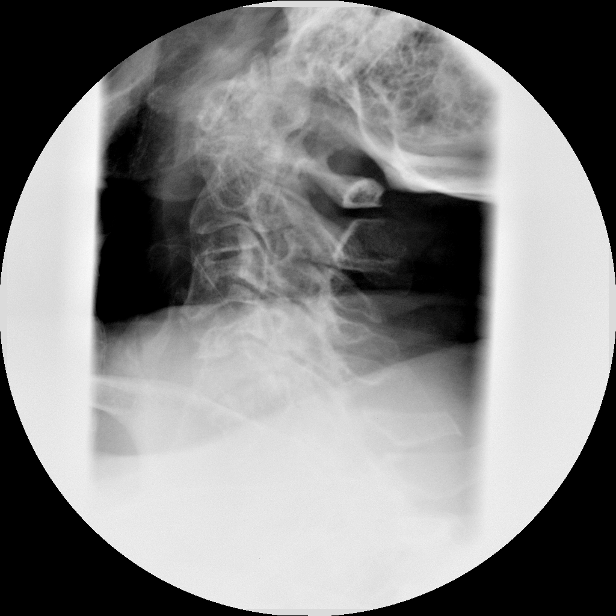

[Series 4: myelogram  white · 1 of 1 slices shown (2 of 8)]
[im 1/1]
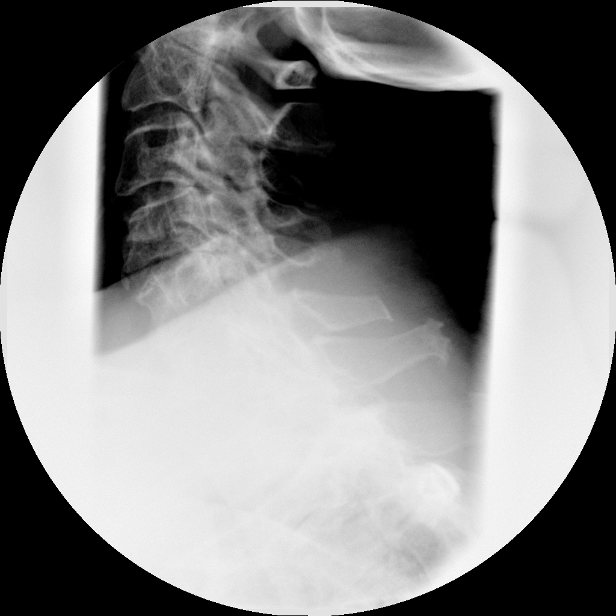

[Series 5: myelogram  white · 1 of 1 slices shown (3 of 8)]
[im 1/1]
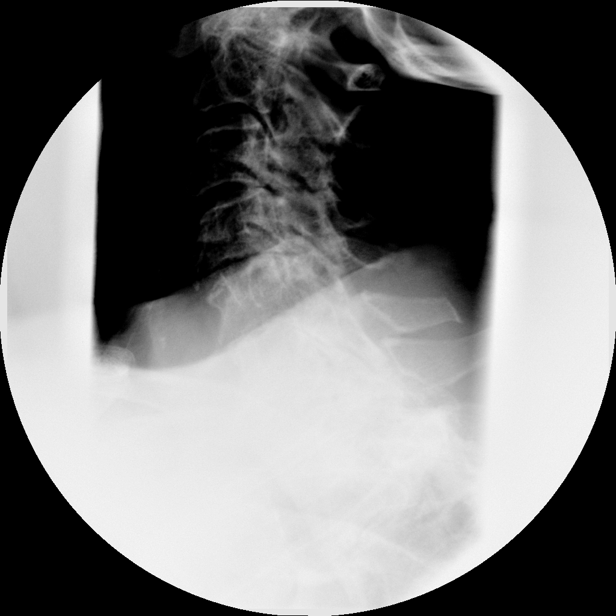

[Series 6: myelogram  white · 1 of 1 slices shown (4 of 8)]
[im 1/1]
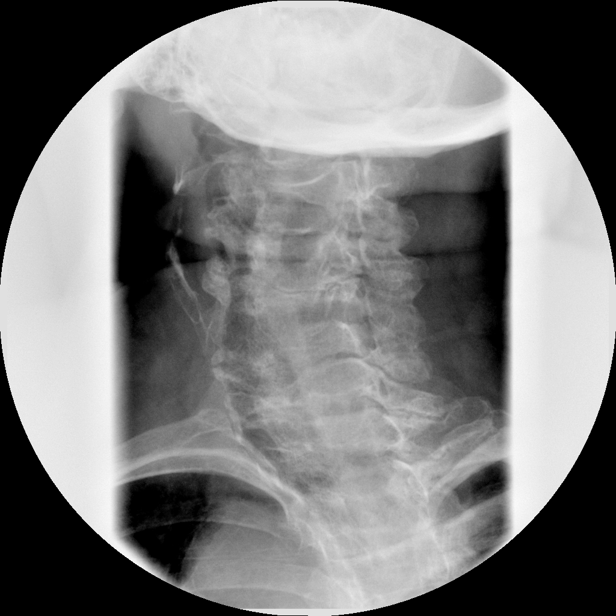

[Series 7: myelogram  white · 1 of 1 slices shown (5 of 8)]
[im 1/1]
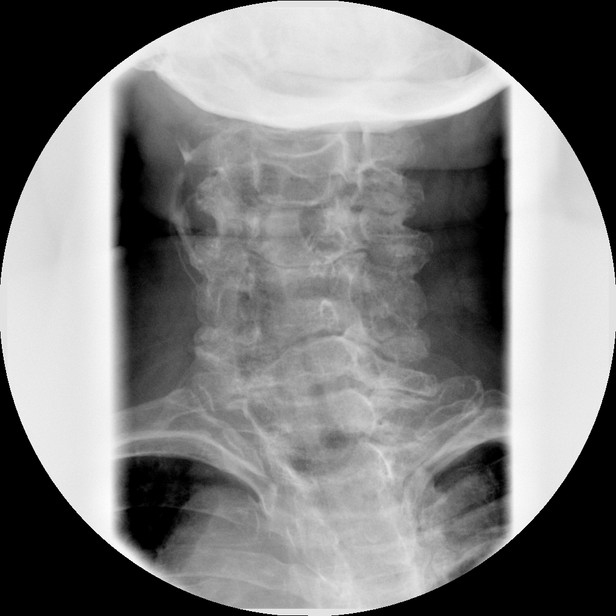

[Series 8: myelogram  white · 1 of 1 slices shown (6 of 8)]
[im 1/1]
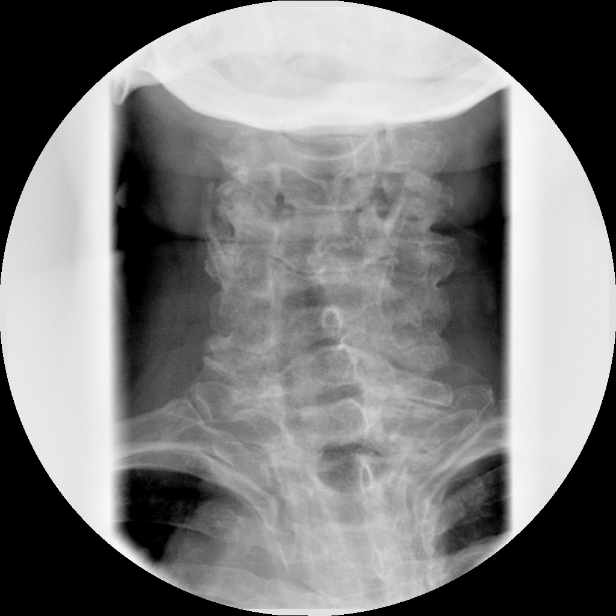

[Series 9: myelogram  white · 1 of 1 slices shown (7 of 8)]
[im 1/1]
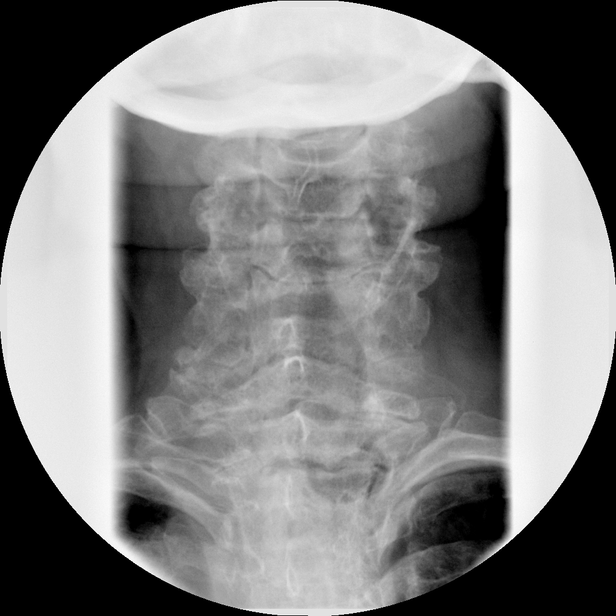

[Series 10: myelogram  white · 1 of 1 slices shown (8 of 8)]
[im 1/1]
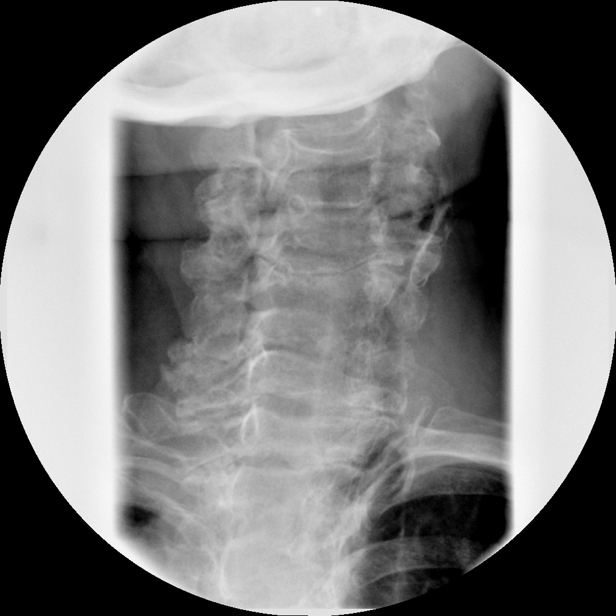

[Series 1001: view not recorded · 0.15mm/px · 1 of 1 slices shown (1 of 3)]
[im 1/1]
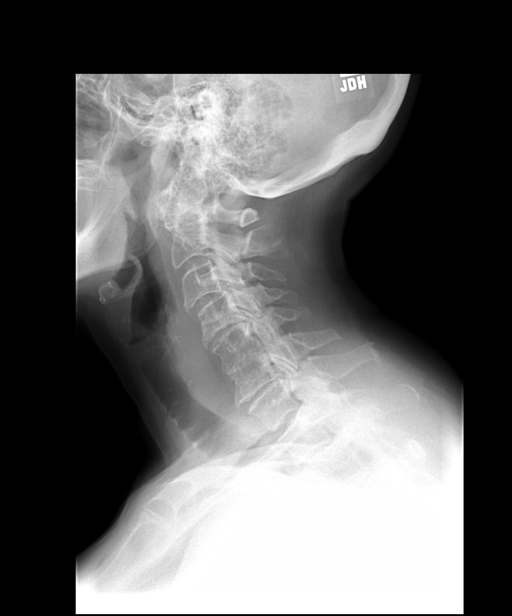

[Series 1002: view not recorded · 0.15mm/px · 1 of 1 slices shown (2 of 3)]
[im 1/1]
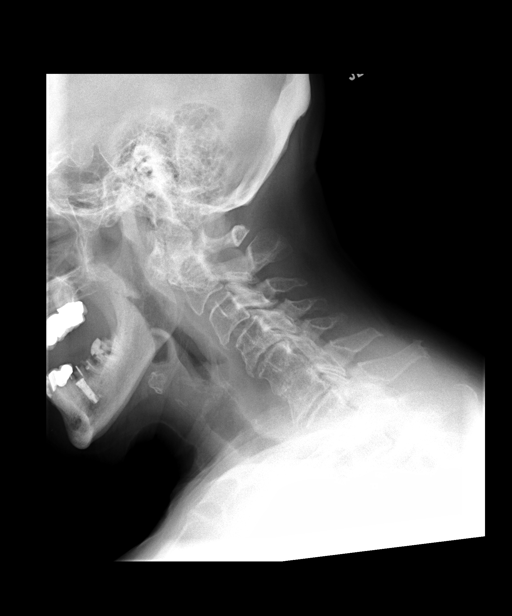

[Series 1003: view not recorded · 0.15mm/px · 1 of 1 slices shown (3 of 3)]
[im 1/1]
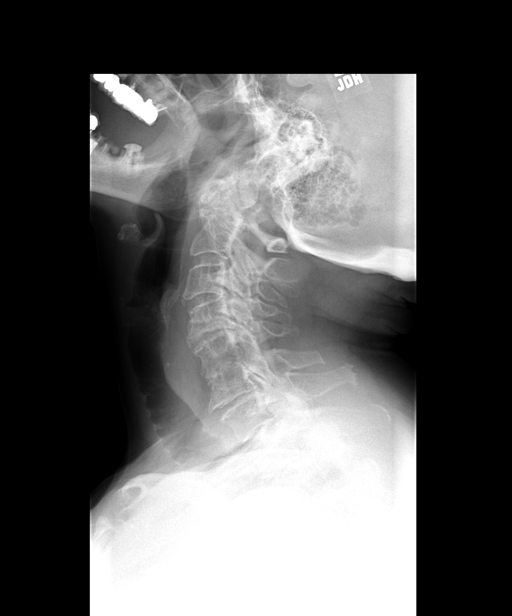

[13 of 13 positions shown; findings below may reference images not displayed]

IMPRESSION: Successful injection of  intrathecal contrast for myelography.

MYELOGRAM CERVICAL
FINDINGS: Good opacification of the cervical subarachnoid space.
No dynamic instability on standing flexion/extension views.  No
significant nerve root cut off. Mild stenosis at the C4-5 level.

Fluoroscopy Time: 1.50 minutes
IMPRESSION: As above.

CT MYELOGRAPHY CERVICAL SPINE
Moderate degenerative change anteriorly at the C1-C2 articulation.
No abnormal widening.  No significant pannus.

The individual disc spaces were examined as follows:

C2-3:  Normal disc space.  Bilateral facet arthropathy.  Slight
right-sided uncinate spurring could affect the C3 nerve root.
Bilateral facet arthropathy.

C3-4:  1 mm of facet mediated anterolisthesis. Moderate facet
arthropathy bilaterally.  Shallow central protrusion with bilateral
uncinate spurring.  No definite C4 nerve root encroachment.

C4-5:  Advanced disc space narrowing. Soft disc protrusion
centrally.  Central osteophyte formation with bilateral uncinate
spurring and facet arthropathy. Bilateral right greater than left
C5 nerve encroachment may be present.  Mild central canal stenosis
with canal diameter 7 mm.

C5-6:  Severe disc space narrowing.  Mild facet arthropathy.
Slight left-sided uncinate spurring.  No C6 nerve root
encroachment.

C6-7:  Moderate disc space narrowing.  Bilateral facet arthropathy.
No neural encroachment.

C7-T1:  Normal appearing disc space.  Bilateral facet arthropathy.
No neural encroachment.

Subacute T3 compression fracture incidentally noted. Mild carotid
atheromatous change.  5 cm ascending aortic aneurysm with
atheromatous changes in the transverse arch. This is this is
increased in size 7442.  Cardiovascular  surgical consultation
warranted.  Incompletely evaluated mediastinal adenopathy.
IMPRESSION: Potentially symptomatic right-sided nerve root compression at C2-3,
C3-4, and C4-5.

Mild stenosis at C4-5 is multifactorial.

5 cm ascending aortic aneurysm.  Recommend cardiovascular surgery
consultation..

## 2014-09-28 ENCOUNTER — Other Ambulatory Visit: Payer: Self-pay | Admitting: Internal Medicine

## 2014-10-10 ENCOUNTER — Other Ambulatory Visit: Payer: Self-pay | Admitting: Nephrology

## 2014-10-10 DIAGNOSIS — N183 Chronic kidney disease, stage 3 unspecified: Secondary | ICD-10-CM

## 2014-10-16 ENCOUNTER — Ambulatory Visit
Admission: RE | Admit: 2014-10-16 | Discharge: 2014-10-16 | Disposition: A | Discharge status: Home / Self Care | Payer: Medicare Other | Source: Ambulatory Visit | Attending: Nephrology | Admitting: Nephrology

## 2014-10-16 DIAGNOSIS — N183 Chronic kidney disease, stage 3 unspecified: Secondary | ICD-10-CM

## 2014-10-16 DIAGNOSIS — N189 Chronic kidney disease, unspecified: Secondary | ICD-10-CM | POA: Diagnosis not present

## 2014-10-18 ENCOUNTER — Telehealth: Payer: Self-pay | Admitting: Internal Medicine

## 2014-10-18 MED ORDER — FLUCONAZOLE 100 MG PO TABS
100.0000 mg | ORAL_TABLET | ORAL | Status: DC
Start: 2014-10-18 — End: 2014-11-29

## 2014-10-18 MED ORDER — AZITHROMYCIN 250 MG PO TABS: ORAL_TABLET | ORAL | Status: DC

## 2014-10-18 NOTE — Telephone Encounter (Signed)
patient is having a sinus cold, raw sore throat, yellow drainage and fever of 101. She is hoping you can call her in a zpac and also Diflucan for thrush.

## 2014-10-18 NOTE — Telephone Encounter (Signed)
Ok Done Thx 

## 2014-10-19 NOTE — Telephone Encounter (Signed)
Called pt no answer LMOM md sent antibiotic to pharmacy...Johny Chess

## 2014-10-22 DIAGNOSIS — R312 Other microscopic hematuria: Secondary | ICD-10-CM | POA: Diagnosis not present

## 2014-11-06 ENCOUNTER — Encounter (HOSPITAL_COMMUNITY): Admission: RE | Admit: 2014-11-06 | Payer: Medicare Other | Source: Ambulatory Visit

## 2014-11-20 DIAGNOSIS — M0609 Rheumatoid arthritis without rheumatoid factor, multiple sites: Secondary | ICD-10-CM | POA: Diagnosis not present

## 2014-11-20 DIAGNOSIS — Z79899 Other long term (current) drug therapy: Secondary | ICD-10-CM | POA: Diagnosis not present

## 2014-11-20 DIAGNOSIS — M797 Fibromyalgia: Secondary | ICD-10-CM | POA: Diagnosis not present

## 2014-11-20 DIAGNOSIS — M25511 Pain in right shoulder: Secondary | ICD-10-CM | POA: Diagnosis not present

## 2014-11-20 DIAGNOSIS — M19041 Primary osteoarthritis, right hand: Secondary | ICD-10-CM | POA: Diagnosis not present

## 2014-11-29 ENCOUNTER — Ambulatory Visit (INDEPENDENT_AMBULATORY_CARE_PROVIDER_SITE_OTHER): Payer: Medicare Other | Admitting: Internal Medicine

## 2014-11-29 ENCOUNTER — Encounter: Payer: Self-pay | Admitting: Internal Medicine

## 2014-11-29 VITALS — BP 160/90 | HR 80 | Wt 163.0 lb

## 2014-11-29 DIAGNOSIS — M359 Systemic involvement of connective tissue, unspecified: Secondary | ICD-10-CM

## 2014-11-29 DIAGNOSIS — M2040 Other hammer toe(s) (acquired), unspecified foot: Secondary | ICD-10-CM

## 2014-11-29 DIAGNOSIS — D6959 Other secondary thrombocytopenia: Secondary | ICD-10-CM

## 2014-11-29 DIAGNOSIS — M609 Myositis, unspecified: Secondary | ICD-10-CM

## 2014-11-29 DIAGNOSIS — M791 Myalgia: Secondary | ICD-10-CM | POA: Diagnosis not present

## 2014-11-29 DIAGNOSIS — T50905A Adverse effect of unspecified drugs, medicaments and biological substances, initial encounter: Principal | ICD-10-CM

## 2014-11-29 DIAGNOSIS — IMO0001 Reserved for inherently not codable concepts without codable children: Secondary | ICD-10-CM

## 2014-11-29 MED ORDER — ESTRADIOL 10 MCG VA TABS: ORAL_TABLET | VAGINAL | Status: DC

## 2014-11-29 MED ORDER — OXYCODONE HCL ER 10 MG PO T12A: 10.0000 mg | EXTENDED_RELEASE_TABLET | Freq: Two times a day (BID) | ORAL | Status: DC

## 2014-11-29 NOTE — Progress Notes (Signed)
Subjective:  Patient ID: Diane Abbott, female    DOB: 1934-07-04  Age: 80 y.o. MRN: [id number]  CC: No chief complaint on file.   HPI   FLORDIA KASSEM presents for chronic pain, insomnia, CTD f/u. C/o hammer toes. Pt is upset about MyChart records/Dx's - discussed  Outpatient Prescriptions Prior to Visit  Medication Sig Dispense Refill  . aspirin EC 81 MG EC tablet Take 1 tablet (81 mg total) by mouth daily.    . diazepam (VALIUM) 2 MG tablet Take 1 tablet (2 mg total) by mouth every 12 (twelve) hours as needed for anxiety. 30 tablet 1  . diphenhydrAMINE (SOMINEX) 25 MG tablet Take 25 mg by mouth at bedtime as needed for sleep.    . Estradiol (VAGIFEM) 10 MCG TABS vaginal tablet INSERT ONE TABLET VAGINALLY EVERY 2 (TWO) DAYS 15 tablet 2  . loratadine (CLARITIN) 10 MG tablet Take 10 mg by mouth daily.    . meclizine (ANTIVERT) 12.5 MG tablet TAKE ONE TO TWO TABLETS BY MOUTH THREE TIMES DAILY AS NEEDED FOR DIZZINESS 60 tablet 0  . NEXIUM 40 MG capsule TAKE ONE CAPSULE BY MOUTH TWICE DAILY 60 capsule 0  . OxyCODONE (OXYCONTIN) 10 mg T12A 12 hr tablet Take 1 tablet (10 mg total) by mouth every 12 (twelve) hours. Please fill on or after 10/29/14 60 tablet 0  . Probiotic Product (PROBIOTIC DAILY PO) Take by mouth daily.    Marland Kitchen rOPINIRole (REQUIP) 2 MG tablet TAKE ONE TABLET BY MOUTH THREE TIMES DAILY 90 tablet 5  . azithromycin (ZITHROMAX Z-PAK) 250 MG tablet As directed 6 each 0  . fluconazole (DIFLUCAN) 100 MG tablet Take 1 tablet (100 mg total) by mouth as directed. 11 tablet 0  . VAGIFEM 10 MCG TABS vaginal tablet INSERT ONE TABLET IN VAGINA EVERY OTHER DAY 15 tablet 0  . triamcinolone cream (KENALOG) 0.5 % Apply 1 application topically 3 (three) times daily. (Patient not taking: Reported on 11/29/2014) 45 g 1  . triazolam (HALCION) 0.125 MG tablet TAKE ONE TABLET BY MOUTH AT BEDTIME AS NEEDED (Patient not taking: Reported on 11/29/2014) 30 tablet 3   No facility-administered medications  prior to visit.    ROS Review of Systems  Constitutional: Negative for chills, activity change, appetite change, fatigue and unexpected weight change.  HENT: Negative for congestion, mouth sores and sinus pressure.   Eyes: Negative for visual disturbance.  Respiratory: Negative for cough and chest tightness.   Gastrointestinal: Negative for nausea and abdominal pain.  Genitourinary: Negative for frequency, difficulty urinating and vaginal pain.  Musculoskeletal: Positive for back pain and gait problem.  Skin: Negative for pallor and rash.  Neurological: Negative for dizziness, tremors, weakness, numbness and headaches.  Psychiatric/Behavioral: Positive for sleep disturbance. Negative for behavioral problems and confusion. The patient is nervous/anxious.     Objective:  BP 160/90 mmHg  Pulse 80  Wt 163 lb (73.936 kg)  SpO2 96%  BP Readings from Last 3 Encounters:  11/29/14 160/90  09/10/14 122/60  08/29/14 144/80    Wt Readings from Last 3 Encounters:  11/29/14 163 lb (73.936 kg)  09/10/14 163 lb (73.936 kg)  08/29/14 163 lb (73.936 kg)    Physical Exam  Constitutional: She appears well-developed. No distress.  HENT:  Head: Normocephalic.  Right Ear: External ear normal.  Left Ear: External ear normal.  Nose: Nose normal.  Mouth/Throat: Oropharynx is clear and moist.  Eyes: Conjunctivae are normal. Pupils are equal, round, and reactive to light.  Right eye exhibits no discharge. Left eye exhibits no discharge.  Neck: Normal range of motion. Neck supple. No JVD present. No tracheal deviation present. No thyromegaly present.  Cardiovascular: Normal rate and regular rhythm.   Murmur heard. Pulmonary/Chest: No stridor. No respiratory distress. She has no wheezes.  Abdominal: Soft. Bowel sounds are normal. She exhibits no distension and no mass. There is no tenderness. There is no rebound and no guarding.  Musculoskeletal: She exhibits no edema or tenderness.    Lymphadenopathy:    She has no cervical adenopathy.  Neurological: She displays normal reflexes. No cranial nerve deficit. She exhibits normal muscle tone. Coordination normal.  Skin: No rash noted. No erythema.  Psychiatric: Her behavior is normal. Judgment and thought content normal.  in a bad mood today: upset about MyChart records/Dx's - discussed Hammer toes  Lab Results  Component Value Date   WBC 3.0* 09/10/2014   HGB 12.5 09/10/2014   HCT 37.4 09/10/2014   PLT 104* 09/10/2014   GLUCOSE 114* 09/10/2014   ALT 17 09/10/2014   AST 22 09/10/2014   NA 142 09/10/2014   K 3.9 09/10/2014   CL 109 09/10/2014   CREATININE 0.95 09/10/2014   BUN 30* 09/10/2014   CO2 26 09/10/2014   TSH 2.88 01/09/2011   INR 1.34 09/29/2012   HGBA1C 5.7* 09/27/2012    US Renal  10/16/2014   CLINICAL DATA:  80 year old female with a history of chronic kidney disease  EXAM: RENAL / URINARY TRACT ULTRASOUND COMPLETE  COMPARISON:  None.  FINDINGS: Right Kidney:  Length: 9.3 cm. Echogenicity of the right kidney less than that of the adjacent liver. No hydronephrosis or stones identified.  Left Kidney:  Length: 9.2 cm. Echogenicity of the left kidney is less than that of the adjacent spleen. No hydronephrosis. 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 Anechoic cystic structure on the lateral cortex of the inferior left kidney, which appears to be most compatible with a benign cyst.  Bladder:  Appears normal for degree of bladder distention.  IMPRESSION: No evidence of hydronephrosis of the left or right kidney.  Echogenicity of the right and left kidneys equivalent to or less than that of the adjacent liver and spleen, respectively, without sonographic evidence of medical renal disease.  Signed,  Dulcy Fanny. Earleen Newport, DO  [facility]  [facility]   Electronically Signed   By: Corrie Mckusick D.O.   On:  10/16/2014 12:25    Assessment & Plan:   There are no diagnoses linked to this encounter. I have discontinued Ms. Pretty's VAGIFEM, azithromycin, and fluconazole. I am also having her maintain her aspirin, Probiotic Product (PROBIOTIC DAILY PO), NEXIUM, triamcinolone cream, loratadine, Estradiol, triazolam, rOPINIRole, diazepam, meclizine, OxyCODONE, and diphenhydrAMINE.  No orders of the defined types were placed in this encounter.     Follow-up: No Follow-up on file.  Walker Kehr, MD

## 2014-11-29 NOTE — Assessment & Plan Note (Signed)
Ref to Dr Sharol Given

## 2014-11-29 NOTE — Assessment & Plan Note (Signed)
Chronic FMS/CTD, severe On Oxycontin  Potential benefits of a long term opioids use as well as potential risks (i.e. addiction risk, apnea etc) and complications (i.e. Somnolence, constipation and others) were explained to the patient and were aknowledged 

## 2014-11-29 NOTE — Assessment & Plan Note (Signed)
Chronic pain 

## 2014-11-29 NOTE — Progress Notes (Signed)
Pre visit review using our clinic review tool, if applicable. No additional management support is needed unless otherwise documented below in the visit note. 

## 2014-11-29 NOTE — Assessment & Plan Note (Signed)
2015 likely due to RA Rx

## 2014-12-03 DIAGNOSIS — M79671 Pain in right foot: Secondary | ICD-10-CM | POA: Diagnosis not present

## 2014-12-03 DIAGNOSIS — M0609 Rheumatoid arthritis without rheumatoid factor, multiple sites: Secondary | ICD-10-CM | POA: Diagnosis not present

## 2014-12-03 DIAGNOSIS — M2040 Other hammer toe(s) (acquired), unspecified foot: Secondary | ICD-10-CM | POA: Diagnosis not present

## 2014-12-04 DIAGNOSIS — M06832 Other specified rheumatoid arthritis, left wrist: Secondary | ICD-10-CM | POA: Diagnosis not present

## 2014-12-04 DIAGNOSIS — M069 Rheumatoid arthritis, unspecified: Secondary | ICD-10-CM | POA: Diagnosis not present

## 2014-12-20 DIAGNOSIS — Z79899 Other long term (current) drug therapy: Secondary | ICD-10-CM | POA: Diagnosis not present

## 2014-12-27 ENCOUNTER — Telehealth: Payer: Self-pay | Admitting: Internal Medicine

## 2014-12-27 NOTE — Telephone Encounter (Signed)
Patient daughter stated that she cannot find the DMV disability forn, can she get another one and have Dr Alain Marion sign it she will come pick it up, it will have to be sent in buy the end of the month. Phone # [phone] [name]

## 2015-01-01 ENCOUNTER — Encounter (HOSPITAL_COMMUNITY): Payer: Self-pay

## 2015-01-01 ENCOUNTER — Encounter (HOSPITAL_COMMUNITY)
Admission: RE | Admit: 2015-01-01 | Discharge: 2015-01-01 | Disposition: A | Discharge status: Home / Self Care | Payer: Medicare Other | Source: Ambulatory Visit | Attending: Rheumatology | Admitting: Rheumatology

## 2015-01-01 ENCOUNTER — Telehealth: Payer: Self-pay | Admitting: Internal Medicine

## 2015-01-01 DIAGNOSIS — M069 Rheumatoid arthritis, unspecified: Secondary | ICD-10-CM | POA: Diagnosis not present

## 2015-01-01 DIAGNOSIS — M06832 Other specified rheumatoid arthritis, left wrist: Secondary | ICD-10-CM | POA: Diagnosis not present

## 2015-01-01 LAB — COMPREHENSIVE METABOLIC PANEL
ALT: 13 U/L — ABNORMAL LOW (ref 14–54)
AST: 26 U/L (ref 15–41)
Albumin: 3.8 g/dL (ref 3.5–5.0)
Alkaline Phosphatase: 65 U/L (ref 38–126)
Anion gap: 10 (ref 5–15)
BUN: 20 mg/dL (ref 6–20)
CHLORIDE: 107 mmol/L (ref 101–111)
CO2: 26 mmol/L (ref 22–32)
Calcium: 9.2 mg/dL (ref 8.9–10.3)
Creatinine, Ser: 1.09 mg/dL — ABNORMAL HIGH (ref 0.44–1.00)
GFR calc Af Amer: 55 mL/min — ABNORMAL LOW (ref >60)
GFR, EST NON AFRICAN AMERICAN: 47 mL/min — AB (ref >60)
Glucose, Bld: 109 mg/dL — ABNORMAL HIGH (ref 65–99)
POTASSIUM: 3.5 mmol/L (ref 3.5–5.1)
Sodium: 143 mmol/L (ref 135–145)
TOTAL PROTEIN: 7.1 g/dL (ref 6.5–8.1)
Total Bilirubin: 0.5 mg/dL (ref 0.3–1.2)

## 2015-01-01 LAB — CBC WITH DIFFERENTIAL/PLATELET
BASOS PCT: 2 % — AB (ref 0–1)
Basophils Absolute: 0.1 10*3/uL (ref 0.0–0.1)
EOS PCT: 5 % (ref 0–5)
Eosinophils Absolute: 0.2 10*3/uL (ref 0.0–0.7)
HCT: 37.9 % (ref 36.0–46.0)
Hemoglobin: 12.9 g/dL (ref 12.0–15.0)
LYMPHS ABS: 1 10*3/uL (ref 0.7–4.0)
Lymphocytes Relative: 29 % (ref 12–46)
MCH: 32.4 pg (ref 26.0–34.0)
MCHC: 34 g/dL (ref 30.0–36.0)
MCV: 95.2 fL (ref 78.0–100.0)
MONO ABS: 0.3 10*3/uL (ref 0.1–1.0)
Monocytes Relative: 8 % (ref 3–12)
Neutro Abs: 1.8 10*3/uL (ref 1.7–7.7)
Neutrophils Relative %: 56 % (ref 43–77)
PLATELETS: 116 10*3/uL — AB (ref 150–400)
RBC: 3.98 MIL/uL (ref 3.87–5.11)
RDW: 13.3 % (ref 11.5–15.5)
WBC: 3.4 10*3/uL — ABNORMAL LOW (ref 4.0–10.5)

## 2015-01-01 MED ORDER — SODIUM CHLORIDE 0.9 % IV SOLN
150.0000 mg | Freq: Once | INTRAVENOUS | Status: AC
  Administered 2015-01-01: 150 mg via INTRAVENOUS
  Filled 2015-01-01: qty 12

## 2015-01-01 MED ORDER — SODIUM CHLORIDE 0.9 % IV SOLN
250.0000 mL | INTRAVENOUS | Status: DC
  Administered 2015-01-01: 250 mL via INTRAVENOUS

## 2015-01-01 NOTE — Telephone Encounter (Signed)
Completed form given to pt's daughter in the office today.

## 2015-01-21 ENCOUNTER — Other Ambulatory Visit: Payer: Self-pay | Admitting: Internal Medicine

## 2015-01-22 DIAGNOSIS — M2041 Other hammer toe(s) (acquired), right foot: Secondary | ICD-10-CM | POA: Diagnosis not present

## 2015-01-22 DIAGNOSIS — M205X1 Other deformities of toe(s) (acquired), right foot: Secondary | ICD-10-CM | POA: Diagnosis not present

## 2015-01-22 DIAGNOSIS — G8918 Other acute postprocedural pain: Secondary | ICD-10-CM | POA: Diagnosis not present

## 2015-02-20 DIAGNOSIS — Z862 Personal history of diseases of the blood and blood-forming organs and certain disorders involving the immune mechanism: Secondary | ICD-10-CM | POA: Diagnosis not present

## 2015-02-20 DIAGNOSIS — M069 Rheumatoid arthritis, unspecified: Secondary | ICD-10-CM | POA: Diagnosis not present

## 2015-02-20 DIAGNOSIS — Z954 Presence of other heart-valve replacement: Secondary | ICD-10-CM | POA: Diagnosis not present

## 2015-02-20 DIAGNOSIS — E039 Hypothyroidism, unspecified: Secondary | ICD-10-CM | POA: Diagnosis not present

## 2015-02-20 DIAGNOSIS — Z940 Kidney transplant status: Secondary | ICD-10-CM | POA: Diagnosis not present

## 2015-02-20 DIAGNOSIS — F419 Anxiety disorder, unspecified: Secondary | ICD-10-CM | POA: Diagnosis not present

## 2015-02-20 DIAGNOSIS — N183 Chronic kidney disease, stage 3 (moderate): Secondary | ICD-10-CM | POA: Diagnosis not present

## 2015-02-20 DIAGNOSIS — M797 Fibromyalgia: Secondary | ICD-10-CM | POA: Diagnosis not present

## 2015-02-20 DIAGNOSIS — K589 Irritable bowel syndrome without diarrhea: Secondary | ICD-10-CM | POA: Diagnosis not present

## 2015-02-20 DIAGNOSIS — J449 Chronic obstructive pulmonary disease, unspecified: Secondary | ICD-10-CM | POA: Diagnosis not present

## 2015-02-26 ENCOUNTER — Encounter (HOSPITAL_COMMUNITY): Payer: Medicare Other

## 2015-02-28 ENCOUNTER — Encounter: Payer: Self-pay | Admitting: Internal Medicine

## 2015-02-28 ENCOUNTER — Ambulatory Visit (INDEPENDENT_AMBULATORY_CARE_PROVIDER_SITE_OTHER): Payer: Medicare Other | Admitting: Internal Medicine

## 2015-02-28 VITALS — BP 122/70 | HR 80 | Wt 165.0 lb

## 2015-02-28 DIAGNOSIS — E559 Vitamin D deficiency, unspecified: Secondary | ICD-10-CM

## 2015-02-28 DIAGNOSIS — N183 Chronic kidney disease, stage 3 unspecified: Secondary | ICD-10-CM

## 2015-02-28 DIAGNOSIS — I351 Nonrheumatic aortic (valve) insufficiency: Secondary | ICD-10-CM

## 2015-02-28 DIAGNOSIS — E538 Deficiency of other specified B group vitamins: Secondary | ICD-10-CM | POA: Diagnosis not present

## 2015-02-28 DIAGNOSIS — M359 Systemic involvement of connective tissue, unspecified: Secondary | ICD-10-CM

## 2015-02-28 DIAGNOSIS — N189 Chronic kidney disease, unspecified: Secondary | ICD-10-CM

## 2015-02-28 MED ORDER — CYANOCOBALAMIN 1000 MCG/ML IJ SOLN
1000.0000 ug | Freq: Once | INTRAMUSCULAR | Status: AC
  Administered 2015-02-28: 1000 ug via INTRAMUSCULAR

## 2015-02-28 MED ORDER — OXYCODONE HCL ER 10 MG PO T12A: 10.0000 mg | EXTENDED_RELEASE_TABLET | Freq: Two times a day (BID) | ORAL | Status: DC

## 2015-02-28 MED ORDER — OXYCODONE HCL ER 10 MG PO T12A
10.0000 mg | EXTENDED_RELEASE_TABLET | Freq: Two times a day (BID) | ORAL | Status: DC
Start: 2015-02-28 — End: 2015-06-03

## 2015-02-28 NOTE — Assessment & Plan Note (Signed)
Re-start Vit D Risks associated with treatment noncompliance were discussed. Compliance was encouraged.  

## 2015-02-28 NOTE — Progress Notes (Signed)
Subjective:  Patient ID: Diane Abbott, female    DOB: [birth year]  Age: 80 y.o. MRN: [id number]  CC: No chief complaint on file.   HPI Diane Abbott presents for chronic pain, s/p R foot surgery, Vit B12 def f/u.  Outpatient Prescriptions Prior to Visit  Medication Sig Dispense Refill  . aspirin EC 81 MG EC tablet Take 1 tablet (81 mg total) by mouth daily.    . diazepam (VALIUM) 2 MG tablet Take 1 tablet (2 mg total) by mouth every 12 (twelve) hours as needed for anxiety. 30 tablet 1  . diphenhydrAMINE (SOMINEX) 25 MG tablet Take 25 mg by mouth at bedtime as needed for sleep.    . Estradiol (VAGIFEM) 10 MCG TABS vaginal tablet INSERT ONE TABLET VAGINALLY EVERY 2 (TWO) DAYS 30 tablet 5  . loratadine (CLARITIN) 10 MG tablet Take 10 mg by mouth daily.    . meclizine (ANTIVERT) 12.5 MG tablet TAKE ONE TO TWO TABLETS BY MOUTH THREE TIMES DAILY AS NEEDED FOR DIZZINESS 60 tablet 0  . NEXIUM 40 MG capsule TAKE ONE CAPSULE BY MOUTH TWICE DAILY 60 capsule 0  . Probiotic Product (PROBIOTIC DAILY PO) Take by mouth daily.    Marland Kitchen rOPINIRole (REQUIP) 2 MG tablet TAKE ONE TABLET BY MOUTH THREE TIMES DAILY 90 tablet 5  . triamcinolone cream (KENALOG) 0.5 % Apply 1 application topically 3 (three) times daily. 45 g 1  . OxyCODONE (OXYCONTIN) 10 mg T12A 12 hr tablet Take 1 tablet (10 mg total) by mouth every 12 (twelve) hours. Please fill on or after 01/30/15 60 tablet 0  . triazolam (HALCION) 0.125 MG tablet TAKE ONE TABLET BY MOUTH AT BEDTIME AS NEEDED (Patient not taking: Reported on 02/28/2015) 30 tablet 3   No facility-administered medications prior to visit.    ROS Review of Systems  Constitutional: Positive for fatigue. Negative for chills, activity change, appetite change and unexpected weight change.  HENT: Negative for congestion, mouth sores and sinus pressure.   Eyes: Negative for visual disturbance.  Respiratory: Negative for cough and chest tightness.   Gastrointestinal: Negative for  nausea and abdominal pain.  Genitourinary: Negative for frequency, difficulty urinating and vaginal pain.  Musculoskeletal: Positive for arthralgias. Negative for back pain and gait problem.  Skin: Negative for pallor and rash.  Neurological: Negative for dizziness, tremors, weakness, numbness and headaches.  Psychiatric/Behavioral: Positive for sleep disturbance and dysphoric mood. Negative for confusion. The patient is nervous/anxious.     Objective:  BP 122/70 mmHg  Pulse 80  Wt 165 lb (74.844 kg)  SpO2 97%  BP Readings from Last 3 Encounters:  02/28/15 122/70  01/01/15 128/68  11/29/14 160/90    Wt Readings from Last 3 Encounters:  02/28/15 165 lb (74.844 kg)  11/29/14 163 lb (73.936 kg)  09/10/14 163 lb (73.936 kg)    Physical Exam  Constitutional: She appears well-developed. No distress.  HENT:  Head: Normocephalic.  Right Ear: External ear normal.  Left Ear: External ear normal.  Nose: Nose normal.  Mouth/Throat: Oropharynx is clear and moist.  Eyes: Conjunctivae are normal. Pupils are equal, round, and reactive to light. Right eye exhibits no discharge. Left eye exhibits no discharge.  Neck: Normal range of motion. Neck supple. No JVD present. No tracheal deviation present. No thyromegaly present.  Cardiovascular: Normal rate, regular rhythm and normal heart sounds.   Pulmonary/Chest: No stridor. No respiratory distress. She has no wheezes.  Abdominal: Soft. Bowel sounds are normal. She exhibits no distension  and no mass. There is no tenderness. There is no rebound and no guarding.  Musculoskeletal: She exhibits tenderness. She exhibits no edema.  Lymphadenopathy:    She has no cervical adenopathy.  Neurological: She displays normal reflexes. No cranial nerve deficit. She exhibits normal muscle tone. Coordination normal.  Skin: No rash noted. No erythema.  Psychiatric: She has a normal mood and affect. Her behavior is normal. Judgment and thought content normal.     Lab Results  Component Value Date   WBC 3.4* 01/01/2015   HGB 12.9 01/01/2015   HCT 37.9 01/01/2015   PLT 116* 01/01/2015   GLUCOSE 109* 01/01/2015   ALT 13* 01/01/2015   AST 26 01/01/2015   NA 143 01/01/2015   K 3.5 01/01/2015   CL 107 01/01/2015   CREATININE 1.09* 01/01/2015   BUN 20 01/01/2015   CO2 26 01/01/2015   TSH 2.88 01/09/2011   INR 1.34 09/29/2012   HGBA1C 5.7* 09/27/2012    No results found.  Assessment & Plan:   Diagnoses and all orders for this visit:  Aortic insufficiency -     Basic metabolic panel; Future -     TSH; Future  B12 deficiency -     Basic metabolic panel; Future -     TSH; Future -     cyanocobalamin ((VITAMIN B-12)) injection 1,000 mcg; Inject 1 mL (1,000 mcg total) into the muscle once.  Vitamin D deficiency -     Basic metabolic panel; Future -     TSH; Future  Diffuse connective tissue disease (HCC) -     Basic metabolic panel; Future -     TSH; Future  Chronic renal insufficiency, stage III (moderate)  Other orders -     Discontinue: OxyCODONE (OXYCONTIN) 10 mg T12A 12 hr tablet; Take 1 tablet (10 mg total) by mouth every 12 (twelve) hours. Please fill on or after 03/23/15 -     OxyCODONE (OXYCONTIN) 10 mg T12A 12 hr tablet; Take 1 tablet (10 mg total) by mouth every 12 (twelve) hours. Please fill on or after 04/22/15 -     OxyCODONE (OXYCONTIN) 10 mg T12A 12 hr tablet; Take 1 tablet (10 mg total) by mouth every 12 (twelve) hours. Please fill on or after 04/22/15   I have discontinued Diane Abbott's oxyCODONE. I have also changed her oxyCODONE and oxyCODONE. Additionally, I am having her maintain her aspirin, Probiotic Product (PROBIOTIC DAILY PO), NEXIUM, triamcinolone cream, loratadine, triazolam, diazepam, meclizine, diphenhydrAMINE, Estradiol, and rOPINIRole. We administered cyanocobalamin.  Meds ordered this encounter  Medications  . DISCONTD: OxyCODONE (OXYCONTIN) 10 mg T12A 12 hr tablet    Sig: Take 1 tablet (10 mg  total) by mouth every 12 (twelve) hours. Please fill on or after 03/23/15    Dispense:  60 tablet    Refill:  0  . OxyCODONE (OXYCONTIN) 10 mg T12A 12 hr tablet    Sig: Take 1 tablet (10 mg total) by mouth every 12 (twelve) hours. Please fill on or after 04/22/15    Dispense:  60 tablet    Refill:  0  . OxyCODONE (OXYCONTIN) 10 mg T12A 12 hr tablet    Sig: Take 1 tablet (10 mg total) by mouth every 12 (twelve) hours. Please fill on or after 04/22/15    Dispense:  60 tablet    Refill:  0  . cyanocobalamin ((VITAMIN B-12)) injection 1,000 mcg    Sig:      Follow-up: Return in about 3 months (around  05/31/2015) for a follow-up visit.  Walker Kehr, MD

## 2015-02-28 NOTE — Assessment & Plan Note (Signed)
On Oxy  Potential benefits of a long term opioids use as well as potential risks (i.e. addiction risk, apnea etc) and complications (i.e. Somnolence, constipation and others) were explained to the patient and were aknowledged.

## 2015-02-28 NOTE — Assessment & Plan Note (Signed)
F/u w/Dr Coladonato  

## 2015-02-28 NOTE — Assessment & Plan Note (Signed)
Doing well 

## 2015-02-28 NOTE — Assessment & Plan Note (Signed)
Re-start Vit B12 Risks associated with treatment noncompliance were discussed. Compliance was encouraged.  

## 2015-02-28 NOTE — Progress Notes (Signed)
Pre visit review using our clinic review tool, if applicable. No additional management support is needed unless otherwise documented below in the visit note. 

## 2015-03-07 DIAGNOSIS — M06832 Other specified rheumatoid arthritis, left wrist: Secondary | ICD-10-CM | POA: Diagnosis not present

## 2015-03-12 ENCOUNTER — Encounter (HOSPITAL_COMMUNITY)
Admission: RE | Admit: 2015-03-12 | Discharge: 2015-03-12 | Disposition: A | Discharge status: Home / Self Care | Payer: Medicare Other | Source: Ambulatory Visit | Attending: Rheumatology | Admitting: Rheumatology

## 2015-03-12 DIAGNOSIS — M069 Rheumatoid arthritis, unspecified: Secondary | ICD-10-CM | POA: Insufficient documentation

## 2015-03-12 LAB — COMPREHENSIVE METABOLIC PANEL
ALT: 17 U/L (ref 14–54)
AST: 30 U/L (ref 15–41)
Albumin: 4 g/dL (ref 3.5–5.0)
Alkaline Phosphatase: 70 U/L (ref 38–126)
Anion gap: 9 (ref 5–15)
BILIRUBIN TOTAL: 0.9 mg/dL (ref 0.3–1.2)
BUN: 25 mg/dL — AB (ref 6–20)
CO2: 27 mmol/L (ref 22–32)
CREATININE: 1.17 mg/dL — AB (ref 0.44–1.00)
Calcium: 9.1 mg/dL (ref 8.9–10.3)
Chloride: 103 mmol/L (ref 101–111)
GFR calc Af Amer: 50 mL/min — ABNORMAL LOW (ref >60)
GFR, EST NON AFRICAN AMERICAN: 43 mL/min — AB (ref >60)
Glucose, Bld: 133 mg/dL — ABNORMAL HIGH (ref 65–99)
Potassium: 3.9 mmol/L (ref 3.5–5.1)
Sodium: 139 mmol/L (ref 135–145)
Total Protein: 7.2 g/dL (ref 6.5–8.1)

## 2015-03-12 LAB — CBC WITH DIFFERENTIAL/PLATELET
BASOS ABS: 0 10*3/uL (ref 0.0–0.1)
Basophils Relative: 1 %
Eosinophils Absolute: 0.1 10*3/uL (ref 0.0–0.7)
Eosinophils Relative: 3 %
HEMATOCRIT: 39.7 % (ref 36.0–46.0)
Hemoglobin: 13 g/dL (ref 12.0–15.0)
LYMPHS ABS: 1.3 10*3/uL (ref 0.7–4.0)
LYMPHS PCT: 30 %
MCH: 31.7 pg (ref 26.0–34.0)
MCHC: 32.7 g/dL (ref 30.0–36.0)
MCV: 96.8 fL (ref 78.0–100.0)
MONO ABS: 0.3 10*3/uL (ref 0.1–1.0)
Monocytes Relative: 6 %
NEUTROS ABS: 2.5 10*3/uL (ref 1.7–7.7)
Neutrophils Relative %: 60 %
Platelets: 130 10*3/uL — ABNORMAL LOW (ref 150–400)
RBC: 4.1 MIL/uL (ref 3.87–5.11)
RDW: 12.9 % (ref 11.5–15.5)
WBC: 4.2 10*3/uL (ref 4.0–10.5)

## 2015-03-12 MED ORDER — SODIUM CHLORIDE 0.9 % IV SOLN
250.0000 mL | Freq: Once | INTRAVENOUS | Status: AC
  Administered 2015-03-12: 250 mL via INTRAVENOUS

## 2015-03-12 MED ORDER — SODIUM CHLORIDE 0.9 % IV SOLN
150.0000 mg | Freq: Once | INTRAVENOUS | Status: AC
  Administered 2015-03-12: 150 mg via INTRAVENOUS
  Filled 2015-03-12: qty 12

## 2015-03-17 NOTE — Progress Notes (Addendum)
Cardiology Office Note   Date:  03/18/2015   ID:  Diane Abbott, DOB 08-08-1934, MRN [id number]   Patient Care Team: Cassandria Anger, MD as PCP - General Carroll Kinds, NP as Nurse Practitioner (Nurse Practitioner) Gaye Pollack, MD as Attending Physician (Cardiothoracic Surgery) Curt Bears, MD (Hematology and Oncology) Larey Dresser, MD as Attending Physician (Cardiology) Lafayette Dragon, MD as Attending Physician (Gastroenterology) Charlotte Crumb, MD as Consulting Physician (Orthopedic Surgery) Donato Heinz, MD as Consulting Physician (Nephrology)    Chief Complaint  Patient presents with  . Follow-up  . ascending thoracic aortic aneurysm  . s/p Bentall     History of Present Illness: Diane Abbott is a 80 y.o. female with a hx of OA, RA, PE/DVT, and status post Bentall bioprosthetic valved conduit and ascending aorta/proximal arch replacement who presents for followup. The patient had been evaluated for cervical spine DJD with an MRI of her neck.  This demonstrated an ascending aortic aneurysm. Therefore, CTA chest showed a 5.8 cm ascending aortic aneurysm extending to the proximal arch. Echo showed moderate AI with trileaflet aortic valve. LHC showed no significant cardiac disease and 3+ AI. In 5/14, she had Bentall procedure with 21 mm bioprosthetic valved conduit as well as replacement of the ascending aorta and proximal arch. Post-op echo showed EF 50%, well-seated bioprosthetic aortic valve. She had CTA chest done in the 9/14 to make sure that this did not signify any problems with her surgical ascending aorta repair. This showed stable repaired ascending aorta and 4.8 cm arch beyond the repair. Last seen my Dr. Aundra Dubin 10/15. Follow-up echocardiogram demonstrated well-functioning aortic valve replacement and normal ejection fraction. Follow-up chest CTA demonstrated stable proximal aortic arch measured at 4.8 cm. She returns for follow-up.  She  tells me that she saw her nephrologist recently. She was told that her murmur was quite interesting. This has concerned her. She also tells me that she can hear her heartbeat if she lies on her left side. She denies chest pain. She does note chronic dyspnea with exertion. Overall she is NYHA 2-2b. She denies orthopnea, PND or edema. She denies syncope. She has a nonproductive cough. She's been diagnosed with asthma by her PCP. She did see urology earlier this year for hematuria. Findings were benign per her report.   Studies/Reports Reviewed Today:  LHC 5/14 Left mainstem: No significant disease.  Left anterior descending (LAD): Mild luminal irregularities.  Left circumflex (LCx): No significant disease. There is a small to moderate ramus and a large PLOM.  Right coronary artery (RCA): No significant disease.  Aortic root angiography: 3+ aortic insufficiency. Aneurysmal dilatation from the sinotubular junction to the proximal aortic arch.  Final Conclusions: No significant CAD. 3+ aortic insufficiency.   Echo 10/15 Mild LVH, EF 55-60%, normal wall motion, grade 1 diastolic dysfunction, AVR okay, mild LAE, PASP 33 mmHg  Chest CTA 10/15 IMPRESSION: Aortic valve replacement and ascending aneurysm repair without significant change. Using axial images, proximal aortic arch is unchanged at 4.8 cm cava and is the most focally dilated portion of the aorta. On the sagittal view, antero posterior diameter is 5.3 cm, also unchanged.   Past Medical History  Diagnosis Date  . Fibromyalgia   . Pulmonary embolism ([place])   . RA (rheumatoid arthritis) ([place])   . Vitamin B 12 deficiency   . Vitamin D deficiency   . Osteoarthritis   . Osteopenia   . Adrenal insufficiency ([place])   . IBS (irritable bowel  syndrome)   . History of blood clots 2008    below knee  . Diabetes mellitus type II     pt denies this 03-05-11  . Esophagitis   . Depression   . GERD (gastroesophageal reflux disease)     . Connective tissue disorder ([place])   . Cataracts, bilateral     more in left than right  . Osteoarthritis of left shoulder 12/07/2011  . Heart murmur   . Leaky heart valve   . Anginal pain [facility])     "comes and goes has occured since 2012"  . Bronchitis Jan 2013-March 2013    severe  . Shortness of breath     comes from aneurysm  . Vertigo     hx of  . Urinary leakage     when coughing  . Sliding hiatal hernia   . Fatty liver   1. C-spine OA 2. Left shoulder replacement in 8/13.  3. Bilateral TKR.  4. Depression 5. Anxiety 6. Fibromyalgia 7. Rheumatoid arthritis. She is getting Sympony injections.  8. 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 Last event was in 2008. Xarelto stopped by heme/onc in 2014. 9. GERD 10. Vitamin B12 deficiency.  11. Cholecystectomy 12. Abdominal hysterectomy 13. Ascending aortic aneurysm: 4.5 cm by CT in 2009. 5.8 cm by CTA chest in 4/14. In 5/14, she had Bentall procedure with 21 mm bioprosthetic valved conduit as well as replacement of the ascending aorta and proximal arch, coronaries reimplanted. Post-op echo (6/14) showed EF 50%, septal dyssynergy, well-seated bioprosthetic aortic valve with mean gradient 16 mmHg. CTA chest (9/14) with stable replaced ascending aorta and 4.8 cm arch beyond the replaced segment. 14. Aortic insufficiency: Echo (4/14) with EF 60-65%, moderate AI, trileaflet aortic valve that incompletely coapts. LHC showed 3+ AI in 5/14. Aortic valve replaced with bioprosthetic valved conduit in 5/14.  15. LHC (5/14) with no significant coronary disease.  16. Candidal esophagitis   Past Surgical History  Procedure Laterality Date  . Abdominal hysterectomy    . Total knee arthroplasty      bilateral  . Cholecystectomy  2003  . Tonsillectomy  1941  . Wrist surgery      bilateral  . Bilateral oophorectomy    . Tummy tuck    . Cosmetic surgery      Tummy tuck  . Total shoulder  arthroplasty  12/07/2011    Procedure: TOTAL SHOULDER ARTHROPLASTY;  Surgeon: Johnny Bridge, MD;  Location: [place];  Service: Orthopedics;  Laterality: Left;  left total shoulder artthroplasty  . Joint replacement Bilateral   . Cardiac catheterization  09/15/12    no PCI  . Colonoscopy    . Breast biopsy Left   . Skin cancer excision Right     cheek  . Eye surgery Bilateral     Cataract  . Bentall procedure N/A 09/29/2012    Procedure: BENTALL PROCEDURE;  Surgeon: Gaye Pollack, MD;  Location: [place];  Service: Open Heart Surgery;  Laterality: N/A;  WITH CIRC ARREST  . Intraoperative transesophageal echocardiogram N/A 09/29/2012    Procedure: INTRAOPERATIVE TRANSESOPHAGEAL ECHOCARDIOGRAM;  Surgeon: Gaye Pollack, MD;  Location[facility];  Service: Open Heart Surgery;  Laterality: N/A;  . Aorta surgery  12/2012  . Appendectomy  1956     Current Outpatient Prescriptions  Medication Sig Dispense Refill  . aspirin EC 81 MG EC tablet Take 1 tablet (81 mg total) by mouth daily.    . diazepam (VALIUM) 2  MG tablet Take 1 tablet (2 mg total) by mouth every 12 (twelve) hours as needed for anxiety. 30 tablet 1  . diphenhydrAMINE (SOMINEX) 25 MG tablet Take 25 mg by mouth at bedtime as needed for sleep.    . Estradiol (VAGIFEM) 10 MCG TABS vaginal tablet INSERT ONE TABLET VAGINALLY EVERY 2 (TWO) DAYS 30 tablet 5  . loratadine (CLARITIN) 10 MG tablet Take 10 mg by mouth daily.    . meclizine (ANTIVERT) 12.5 MG tablet TAKE ONE TO TWO TABLETS BY MOUTH THREE TIMES DAILY AS NEEDED FOR DIZZINESS 60 tablet 0  . NEXIUM 40 MG capsule TAKE ONE CAPSULE BY MOUTH TWICE DAILY 60 capsule 0  . OxyCODONE (OXYCONTIN) 10 mg T12A 12 hr tablet Take 1 tablet (10 mg total) by mouth every 12 (twelve) hours. Please fill on or after 04/22/15 60 tablet 0  . Probiotic Product (PROBIOTIC DAILY PO) Take by mouth daily.    Marland Kitchen rOPINIRole (REQUIP) 2 MG tablet TAKE ONE TABLET BY MOUTH THREE TIMES DAILY 90 tablet 5  . triamcinolone cream  (KENALOG) 0.5 % Apply 1 application topically 3 (three) times daily. 45 g 1  . triazolam (HALCION) 0.125 MG tablet TAKE ONE TABLET BY MOUTH AT BEDTIME AS NEEDED 30 tablet 3   No current facility-administered medications for this visit.    Allergies:   Codeine; Pramipexole dihydrochloride; Rofecoxib; Arava; Clonazepam; Diclofenac sodium; Venlafaxine; Alprazolam; Darvocet; Duloxetine; Gabapentin; Latex; Morphine and related; Nortriptyline hcl; Penicillins; and Prednisone    Social History:   Social History   Social History  . Marital Status: Divorced    Spouse Name: N/A  . Number of Children: N/A  . Years of Education: N/A   Occupational History  . retired    Social History Main Topics  . Smoking status: Never Smoker   . Smokeless tobacco: Never Used  . Alcohol Use: Yes     Comment: occassional  . Drug Use: No  . Sexual Activity: Not Asked   Other Topics Concern  . None   Social History Narrative     Family History:   Family History  Problem Relation Age of Onset  . Ovarian cancer Mother   . Heart disease Maternal Grandmother   . Clotting disorder    . Breast cancer    . Diabetes Mother   . Pancreatic cancer    . COPD Father   . Colon cancer Neg Hx       ROS:   Please see the history of present illness.   Review of Systems  HENT: Positive for hearing loss.   Cardiovascular: Positive for dyspnea on exertion.  All other systems reviewed and are negative.     PHYSICAL EXAM: VS:  BP 160/84 mmHg  Pulse 82  Ht 5\' 1"  (1.549 m)  Wt 164 lb 1.9 oz (74.444 kg)  BMI 31.03 kg/m2    Wt Readings from Last 3 Encounters:  03/12/15 164 lb (74.39 kg)  02/28/15 165 lb (74.844 kg)  11/29/14 163 lb (73.936 kg)     GEN: Well nourished, well developed, in no acute distress HEENT: normal Neck: no JVD,  no masses Cardiac:  Normal S1/S2, RRR; 2/6 harsh systolic murmur RUSB,  no rubs or gallops, no edema   Respiratory:  clear to auscultation bilaterally, no wheezing,  rhonchi or rales. GI: soft, nontender, nondistended, + BS MS: no deformity or atrophy Skin: warm and dry  Neuro:  CNs II-XII intact, Strength and sensation are intact Psych: Normal affect   EKG:  EKG is ordered today.  It demonstrates:   NSR, HR 82, normal axis, QTc 453 ms, no change from prior tracing   Recent Labs: 03/12/2015: ALT 17; BUN 25*; Creatinine, Ser 1.17*; Hemoglobin 13.0; Platelets 130*; Potassium 3.9; Sodium 139    Lipid Panel No results found for: CHOL, TRIG, HDL, CHOLHDL, VLDL, LDLCALC, LDLDIRECT    ASSESSMENT AND PLAN:  1. Ascending aortic aneurysm: Now status post graft replacement of the ascending aorta and the proximal arch. 4.8 cm arch beyond the repair on 10/15 CTA. She has noted that she can hear her heartbeat when she lies on her L side like she did prior to her surgery.  I will arrange for repeat CTA chest.  2. Aortic insufficiency: 3+ AI. Therefore, patient had bioprosthetic valved conduit placed when ascending aorta was repaired. Valve was well-seated on on echo in 10/15. She is concerned about her murmur. Overall, her murmur is stable.  I am going to repeat an echocardiogram. She should continue SBE prophylaxis.    3. History of venous thromboembolism: Xarelto was stopped by heme/onc.  4. Elevated Blood Pressure: Blood pressures are usually optimal. She tells me that she was under a great deal of stress driving here today. I have asked to check her blood pressures over the next 1-2 weeks and call us with her recordings.    Medication Changes: Current medicines are reviewed at length with the patient today.  Concerns regarding medicines are as outlined above.  The following changes have been made:   Discontinued Medications   OXYCODONE (OXYCONTIN) 10 MG T12A 12 HR TABLET    Take 1 tablet (10 mg total) by mouth every 12 (twelve) hours. Please fill on or after 04/22/15   Modified Medications   No medications on file   New Prescriptions   No  medications on file   Labs/ tests ordered today include:   No orders of the defined types were placed in this encounter.     Disposition:    FU with Dr. Loralie Champagne 1 year.     Signed, Versie Starks, MHS 03/18/2015 11:13 AM    [facility] [address], [place], [place]  [address] Phone: [phone]; Fax: [phone]

## 2015-03-18 ENCOUNTER — Ambulatory Visit (INDEPENDENT_AMBULATORY_CARE_PROVIDER_SITE_OTHER): Payer: Medicare Other | Admitting: Physician Assistant

## 2015-03-18 ENCOUNTER — Encounter: Payer: Self-pay | Admitting: Physician Assistant

## 2015-03-18 VITALS — BP 160/84 | HR 82 | Ht 61.0 in | Wt 164.1 lb

## 2015-03-18 DIAGNOSIS — Z95828 Presence of other vascular implants and grafts: Secondary | ICD-10-CM

## 2015-03-18 DIAGNOSIS — R030 Elevated blood-pressure reading, without diagnosis of hypertension: Secondary | ICD-10-CM | POA: Diagnosis not present

## 2015-03-18 DIAGNOSIS — Z954 Presence of other heart-valve replacement: Secondary | ICD-10-CM | POA: Diagnosis not present

## 2015-03-18 DIAGNOSIS — IMO0001 Reserved for inherently not codable concepts without codable children: Secondary | ICD-10-CM

## 2015-03-18 DIAGNOSIS — Z952 Presence of prosthetic heart valve: Secondary | ICD-10-CM

## 2015-03-18 DIAGNOSIS — I712 Thoracic aortic aneurysm, without rupture, unspecified: Secondary | ICD-10-CM

## 2015-03-18 DIAGNOSIS — Z9889 Other specified postprocedural states: Secondary | ICD-10-CM | POA: Diagnosis not present

## 2015-03-18 DIAGNOSIS — Z8679 Personal history of other diseases of the circulatory system: Secondary | ICD-10-CM | POA: Diagnosis not present

## 2015-03-18 LAB — BUN: BUN: 31 mg/dL — ABNORMAL HIGH (ref 7–25)

## 2015-03-18 LAB — CREATININE, SERUM: CREATININE: 1.26 mg/dL — AB (ref 0.60–0.93)

## 2015-03-18 NOTE — Patient Instructions (Addendum)
Medication Instructions:  Your physician recommends that you continue on your current medications as directed. Please refer to the Current Medication list given to you today.    If you need a refill on your cardiac medications before your next appointment, please call your pharmacy.  Labwork: BUN AND CREATININE     Testing/Procedures:   Your physician has requested that you have an echocardiogram. Echocardiography is a painless test that uses sound waves to create images of your heart. It provides your doctor with information about the size and shape of your heart and how well your heart's chambers and valves are working. This procedure takes approximately one hour. There are no restrictions for this procedure.  Non-Cardiac CT Angiography (CTA), is a special type of CT scan that uses a computer to produce multi-dimensional views of major blood vessels throughout the body. In CT angiography, a contrast material is injected through an IV to help visualize the blood vessels     Follow-Up:  Your physician wants you to follow-up in: [place] will receive a reminder letter in the mail two months in advance. If you don't receive a letter, please call our office to schedule the follow-up appointment.    Any Other Special Instructions Will Be Listed Below (If Applicable).  TAKE BLOOD PRESSURE  2 TO 3 TIMES A WEEK  AND IN 2 WEEKS WITH READINGS CALL  OFFICE NURSE TRIAGE TO REPORT READING TO PA-C WEAVER

## 2015-03-19 LAB — QUANTIFERON TB GOLD ASSAY (BLOOD)

## 2015-03-19 LAB — QUANTIFERON IN TUBE

## 2015-03-20 ENCOUNTER — Other Ambulatory Visit: Payer: Self-pay | Admitting: *Deleted

## 2015-03-20 DIAGNOSIS — N183 Chronic kidney disease, stage 3 unspecified: Secondary | ICD-10-CM

## 2015-03-25 ENCOUNTER — Ambulatory Visit (HOSPITAL_COMMUNITY)
Admission: RE | Admit: 2015-03-25 | Discharge: 2015-03-25 | Disposition: A | Discharge status: Home / Self Care | Payer: Medicare Other | Source: Ambulatory Visit | Attending: Physician Assistant | Admitting: Physician Assistant

## 2015-03-25 DIAGNOSIS — Z952 Presence of prosthetic heart valve: Secondary | ICD-10-CM

## 2015-03-25 DIAGNOSIS — Z9889 Other specified postprocedural states: Secondary | ICD-10-CM | POA: Insufficient documentation

## 2015-03-25 DIAGNOSIS — I712 Thoracic aortic aneurysm, without rupture: Secondary | ICD-10-CM | POA: Diagnosis not present

## 2015-03-25 DIAGNOSIS — E119 Type 2 diabetes mellitus without complications: Secondary | ICD-10-CM | POA: Diagnosis not present

## 2015-03-25 DIAGNOSIS — Z954 Presence of other heart-valve replacement: Secondary | ICD-10-CM | POA: Insufficient documentation

## 2015-03-25 DIAGNOSIS — Z9049 Acquired absence of other specified parts of digestive tract: Secondary | ICD-10-CM | POA: Diagnosis not present

## 2015-03-25 DIAGNOSIS — I350 Nonrheumatic aortic (valve) stenosis: Secondary | ICD-10-CM | POA: Diagnosis not present

## 2015-03-25 MED ORDER — IOHEXOL 350 MG/ML SOLN
80.0000 mL | Freq: Once | INTRAVENOUS | Status: AC | PRN
  Administered 2015-03-25: 80 mL via INTRAVENOUS

## 2015-03-26 ENCOUNTER — Telehealth: Payer: Self-pay | Admitting: Internal Medicine

## 2015-03-26 ENCOUNTER — Ambulatory Visit (HOSPITAL_COMMUNITY): Payer: Medicare Other | Attending: Physician Assistant

## 2015-03-26 ENCOUNTER — Other Ambulatory Visit (INDEPENDENT_AMBULATORY_CARE_PROVIDER_SITE_OTHER): Payer: Medicare Other | Admitting: *Deleted

## 2015-03-26 ENCOUNTER — Other Ambulatory Visit: Payer: Self-pay

## 2015-03-26 DIAGNOSIS — I517 Cardiomegaly: Secondary | ICD-10-CM | POA: Insufficient documentation

## 2015-03-26 DIAGNOSIS — R011 Cardiac murmur, unspecified: Secondary | ICD-10-CM | POA: Diagnosis not present

## 2015-03-26 DIAGNOSIS — I071 Rheumatic tricuspid insufficiency: Secondary | ICD-10-CM | POA: Diagnosis not present

## 2015-03-26 DIAGNOSIS — Z954 Presence of other heart-valve replacement: Secondary | ICD-10-CM | POA: Insufficient documentation

## 2015-03-26 DIAGNOSIS — N183 Chronic kidney disease, stage 3 unspecified: Secondary | ICD-10-CM

## 2015-03-26 DIAGNOSIS — I712 Thoracic aortic aneurysm, without rupture: Secondary | ICD-10-CM | POA: Insufficient documentation

## 2015-03-26 DIAGNOSIS — E119 Type 2 diabetes mellitus without complications: Secondary | ICD-10-CM | POA: Insufficient documentation

## 2015-03-26 DIAGNOSIS — I340 Nonrheumatic mitral (valve) insufficiency: Secondary | ICD-10-CM | POA: Insufficient documentation

## 2015-03-26 DIAGNOSIS — Z95828 Presence of other vascular implants and grafts: Secondary | ICD-10-CM | POA: Diagnosis not present

## 2015-03-26 LAB — BASIC METABOLIC PANEL
BUN: 20 mg/dL (ref 7–25)
CALCIUM: 8.9 mg/dL (ref 8.6–10.4)
CO2: 25 mmol/L (ref 20–31)
Chloride: 103 mmol/L (ref 98–110)
Creat: 1.1 mg/dL — ABNORMAL HIGH (ref 0.60–0.93)
Glucose, Bld: 87 mg/dL (ref 65–99)
POTASSIUM: 3.7 mmol/L (ref 3.5–5.3)
SODIUM: 137 mmol/L (ref 135–146)

## 2015-03-26 NOTE — Telephone Encounter (Signed)
Patient is having a runny nose and she states you have prescribed a nose spray to her in the past and she is requesting a prescription for it. She says you would know which one it is. Pharmacy is [facility] in [place]

## 2015-03-27 MED ORDER — IPRATROPIUM BROMIDE 0.06 % NA SOLN: 1.0000 | NASAL | Status: DC | PRN

## 2015-03-27 NOTE — Telephone Encounter (Signed)
Please advise 

## 2015-03-27 NOTE — Telephone Encounter (Signed)
Done. Thx.

## 2015-04-01 ENCOUNTER — Encounter: Payer: Self-pay | Admitting: Physician Assistant

## 2015-04-01 DIAGNOSIS — M19041 Primary osteoarthritis, right hand: Secondary | ICD-10-CM | POA: Diagnosis not present

## 2015-04-01 DIAGNOSIS — N189 Chronic kidney disease, unspecified: Secondary | ICD-10-CM | POA: Diagnosis not present

## 2015-04-01 DIAGNOSIS — Z09 Encounter for follow-up examination after completed treatment for conditions other than malignant neoplasm: Secondary | ICD-10-CM | POA: Diagnosis not present

## 2015-04-01 DIAGNOSIS — M0579 Rheumatoid arthritis with rheumatoid factor of multiple sites without organ or systems involvement: Secondary | ICD-10-CM | POA: Diagnosis not present

## 2015-04-02 ENCOUNTER — Other Ambulatory Visit: Payer: Self-pay

## 2015-04-02 DIAGNOSIS — Z9889 Other specified postprocedural states: Principal | ICD-10-CM

## 2015-04-02 DIAGNOSIS — Z8679 Personal history of other diseases of the circulatory system: Secondary | ICD-10-CM

## 2015-04-03 ENCOUNTER — Telehealth: Payer: Self-pay | Admitting: Cardiology

## 2015-04-03 NOTE — Telephone Encounter (Signed)
LMTCB

## 2015-04-03 NOTE — Telephone Encounter (Signed)
New message      Pt states she is concerned about her aneurysm on her aorta.  She want to talk to the nurse.

## 2015-04-04 NOTE — Telephone Encounter (Signed)
Pt states she has an appt with Dr Cyndia Bent 04/10/15 and will review test results with him.

## 2015-04-10 ENCOUNTER — Ambulatory Visit (INDEPENDENT_AMBULATORY_CARE_PROVIDER_SITE_OTHER): Payer: Medicare Other | Admitting: Surgery

## 2015-04-10 ENCOUNTER — Encounter: Payer: Self-pay | Admitting: Surgery

## 2015-04-10 VITALS — BP 143/88 | HR 80 | Resp 20 | Ht 61.0 in | Wt 164.0 lb

## 2015-04-10 DIAGNOSIS — I77819 Aortic ectasia, unspecified site: Secondary | ICD-10-CM | POA: Diagnosis not present

## 2015-04-10 DIAGNOSIS — I7122 Aneurysm of the aortic arch, without rupture: Secondary | ICD-10-CM

## 2015-04-10 DIAGNOSIS — I719 Aortic aneurysm of unspecified site, without rupture: Secondary | ICD-10-CM

## 2015-04-10 DIAGNOSIS — I712 Thoracic aortic aneurysm, without rupture: Secondary | ICD-10-CM

## 2015-04-14 ENCOUNTER — Encounter: Payer: Self-pay | Admitting: Surgery

## 2015-04-14 NOTE — Progress Notes (Signed)
PCP is Walker Kehr, MD Referring Provider is Loralie Champagne,  MD  Chief Complaint  Patient presents with  . Thoracic Aortic Aneurysm    surgical eval, descending aortic aneurysm.CTA Chest 03/25/15, HX of BENTALL 09/29/12    HPI:  The patient is a 80 year old woman with rheumatoid arthritis, fibromyalgia, IBS, DM and multiple other medial problems as outlined below who underwent Bentall procedure using a composite pericardial valve/conduit and replacement of the ascending aorta and proximal aortic arch on 09/29/2012. Her ascending aorta was 5.8 cm at that time. The aorta had increased in size from 4.5 cm in 2009. She was getting some chest pains and back pain, but it was difficult to tell if this was due to her aneurysm or due to her fibromyalgia and arthritis. The distal anastomosis was done just proximal to the innominate artery where the aorta appeared to come back to more normal size of about 3 cm.  A follow up echo in 02/2014 showed a normal functioning aortic valve prosthesis. She had a follow up CT of the chest on 01/27/2013 that showed the aortic arch to have a diameter beyond the repair of 4.8 cm and the descending aorta was 3.7 cm. A follow up scan on 02/14/2014 showed the maximal diameter of the aortic arch to be 4.9 cm. Her most recent CTA on 03/25/2015 showed the diameter of the aortic arch to have increased further to 5.3 cm.   She recently saw Richardson Dopp, PA for cardiology follow up and was felt to have stable chronic dyspnea on exertion, NYHA 2-2b. She denies any chest or back pack. She denies orthopnea, PND and peripheral edema. She says that she was recently told by her nephrologist that her murmur was unusual. A follow up echo was being arranged by cardiology.    Past Medical History  Diagnosis Date  . Fibromyalgia   . Pulmonary embolism ([place])   . RA (rheumatoid arthritis) ([place])   . Vitamin B 12 deficiency   . Vitamin D deficiency   . Osteoarthritis   . Osteopenia    . Adrenal insufficiency ([place])   . IBS (irritable bowel syndrome)   . History of blood clots 2008    below knee  . Diabetes mellitus type II     pt denies this 03-05-11  . Esophagitis   . Depression   . GERD (gastroesophageal reflux disease)   . Connective tissue disorder ([place])   . Cataracts, bilateral     more in left than right  . Osteoarthritis of left shoulder 12/07/2011  . Heart murmur   . Leaky heart valve   . Anginal pain [facility])     "comes and goes has occured since 2012"  . Bronchitis Jan 2013-March 2013    severe  . Shortness of breath     comes from aneurysm  . Vertigo     hx of  . Urinary leakage     when coughing  . Sliding hiatal hernia   . Fatty liver   . History of echocardiogram     Echo 11/16: Mild LVH, EF 60-65%, normal wall motion, grade 1 diastolic dysfunction, bioprosthetic AVR with mean gradient 21 mmHg and no regurgitation, proximal aortic arch aneurysm 49 mm, moderate TR, PASP 31 mmHg    Past Surgical History  Procedure Laterality Date  . Abdominal hysterectomy    . Total knee arthroplasty      bilateral  . Cholecystectomy  2003  . Tonsillectomy  1941  .  Wrist surgery      bilateral  . Bilateral oophorectomy    . Tummy tuck    . Cosmetic surgery      Tummy tuck  . Total shoulder arthroplasty  12/07/2011    Procedure: TOTAL SHOULDER ARTHROPLASTY;  Surgeon: Johnny Bridge, MD;  Location: [place];  Service: Orthopedics;  Laterality: Left;  left total shoulder artthroplasty  . Joint replacement Bilateral   . Cardiac catheterization  09/15/12    no PCI  . Colonoscopy    . Breast biopsy Left   . Skin cancer excision Right     cheek  . Eye surgery Bilateral     Cataract  . Bentall procedure N/A 09/29/2012    Procedure: BENTALL PROCEDURE;  Surgeon: Gaye Pollack, MD;  Location: [place];  Service: Open Heart Surgery;  Laterality: N/A;  WITH CIRC ARREST  . Intraoperative transesophageal echocardiogram N/A 09/29/2012    Procedure: INTRAOPERATIVE  TRANSESOPHAGEAL ECHOCARDIOGRAM;  Surgeon: Gaye Pollack, MD;  Location: [facility] OR;  Service: Open Heart Surgery;  Laterality: N/A;  . Aorta surgery  12/2012  . Appendectomy  1956    Family History  Problem Relation Age of Onset  . Ovarian cancer Mother   . Heart disease Maternal Grandmother   . Clotting disorder    . Breast cancer    . Diabetes Mother   . Pancreatic cancer    . COPD Father   . Colon cancer Neg Hx     Social History Social History  Substance Use Topics  . Smoking status: Never Smoker   . Smokeless tobacco: Never Used  . Alcohol Use: Yes     Comment: occassional    Current Outpatient Prescriptions  Medication Sig Dispense Refill  . aspirin EC 81 MG EC tablet Take 1 tablet (81 mg total) by mouth daily.    . diazepam (VALIUM) 2 MG tablet Take 1 tablet (2 mg total) by mouth every 12 (twelve) hours as needed for anxiety. 30 tablet 1  . diphenhydrAMINE (SOMINEX) 25 MG tablet Take 25 mg by mouth at bedtime as needed for sleep.    . Estradiol (VAGIFEM) 10 MCG TABS vaginal tablet INSERT ONE TABLET VAGINALLY EVERY 2 (TWO) DAYS 30 tablet 5  . ipratropium (ATROVENT) 0.06 % nasal spray Place 1-2 sprays into the nose as needed. For runny nose 15 mL 3  . loratadine (CLARITIN) 10 MG tablet Take 10 mg by mouth daily.    . meclizine (ANTIVERT) 12.5 MG tablet TAKE ONE TO TWO TABLETS BY MOUTH THREE TIMES DAILY AS NEEDED FOR DIZZINESS 60 tablet 0  . NEXIUM 40 MG capsule TAKE ONE CAPSULE BY MOUTH TWICE DAILY 60 capsule 0  . OxyCODONE (OXYCONTIN) 10 mg T12A 12 hr tablet Take 1 tablet (10 mg total) by mouth every 12 (twelve) hours. Please fill on or after 04/22/15 60 tablet 0  . Probiotic Product (PROBIOTIC DAILY PO) Take by mouth daily.    Marland Kitchen rOPINIRole (REQUIP) 2 MG tablet TAKE ONE TABLET BY MOUTH THREE TIMES DAILY 90 tablet 5  . triamcinolone cream (KENALOG) 0.5 % Apply 1 application topically 3 (three) times daily. 45 g 1  . triazolam (HALCION) 0.125 MG tablet TAKE ONE TABLET BY MOUTH  AT BEDTIME AS NEEDED 30 tablet 3   No current facility-administered medications for this visit.    Allergies  Allergen Reactions  . Codeine Other (See Comments)    Blood pressure drops; "I pass out."  . Pramipexole Dihydrochloride Nausea Only  sick, falling  . Rofecoxib Other (See Comments)    "sleep walks"  . Arava [Leflunomide] Swelling  . Clonazepam   . Diclofenac Sodium   . Venlafaxine   . Alprazolam Other (See Comments)    "Makes me mean"  . Darvocet [Propoxyphene N-Acetaminophen] Other (See Comments)    "makes my eyes dilate. Pt stated I can't see"  . Duloxetine Other (See Comments)    achy legs  . Gabapentin Other (See Comments)    headache  . Latex Itching and Dermatitis    When examined with latex gloves, burns and itches in contact areas per pt.  . Morphine And Related Other (See Comments)    Hallucinations   . Nortriptyline Hcl Other (See Comments)    Migraines   . Penicillins Rash    Can take Cephalosporins  . Prednisone Swelling    Just doesn't want to take it.    Review of Systems  Constitutional: Negative for activity change, fatigue and unexpected weight change.  HENT: Positive for hearing loss.   Eyes: Negative.   Respiratory: Positive for shortness of breath. Negative for cough and chest tightness.   Cardiovascular: Negative for chest pain, palpitations and leg swelling.  Gastrointestinal: Negative.   Endocrine: Negative.   Genitourinary: Negative.   Musculoskeletal: Positive for myalgias and arthralgias.  Skin: Negative.   Allergic/Immunologic: Negative.   Neurological: Negative.   Hematological: Negative.   Psychiatric/Behavioral: Positive for dysphoric mood.    BP 143/88 mmHg  Pulse 80  Resp 20  Ht 5\' 1"  (1.549 m)  Wt 164 lb (74.39 kg)  BMI 31.00 kg/m2  SpO2 97% Physical Exam  Constitutional: She appears well-developed and well-nourished. No distress.  HENT:  Head: Normocephalic and atraumatic.  Mouth/Throat: Oropharynx is  clear and moist.  Eyes: EOM are normal. Pupils are equal, round, and reactive to light.  Neck: Neck supple. No JVD present.  Cardiovascular: Normal rate and regular rhythm.   Murmur heard. Harsh 2/6 systolic murmur along RSB  Pulmonary/Chest: Effort normal and breath sounds normal. No respiratory distress. She exhibits no tenderness.  Well-healed sternotomy scar  Abdominal: Soft. Bowel sounds are normal. She exhibits no distension and no mass. There is no tenderness.  Musculoskeletal: She exhibits no edema.  Lymphadenopathy:    She has no cervical adenopathy.  Neurological: She is alert.  Skin: Skin is warm and dry.  Psychiatric: She has a normal mood and affect.     Diagnostic Tests:   CLINICAL DATA: S/P AVR (aortic valve replacement) Hx Cholecystectomy, Hysterectomy, DM, Aorta Surgery^  EXAM: CT ANGIOGRAPHY CHEST WITH CONTRAST  TECHNIQUE: Multidetector CT imaging of the chest was performed using the standard protocol during bolus administration of intravenous contrast. Multiplanar CT image reconstructions and MIPs were obtained to evaluate the vascular anatomy.  CONTRAST: 85mL OMNIPAQUE IOHEXOL 350 MG/ML SOLN  COMPARISON: 02/14/2014  FINDINGS: The initial noncontrast scan shows changes of median sternotomy ,AVR, and tube graft repair of the ascending aorta. No hyperdense crescent or mediastinal hematoma. No pleural or pericardial effusion. No pneumothorax.  Left arm IV contrast injection. Patent innominate vein and SVC. Satisfactory opacification of pulmonary arteries noted, and there is no evidence of pulmonary emboli. Patient breathing during the acquisition degrades some of the images. Patent pulmonary veins bilaterally. There is dilatation of the distal ascending aorta and proximal arch measured up to 5.3 cm transverse diameter (stable since previous). Distal arch tapers to 2.9 cm, proximal descending dilates again to 4.1 cm (stable by my measurement),  tapering to  a diameter of 2.8 cm above the diaphragm. No dissection or stenosis. Bovine variant brachiocephalic arterial origin anatomy without proximal stenosis.  No hilar or mediastinal adenopathy. Geographic ground-glass opacities in the basilar segments of the left lower lobe, and posteriorly in the right lower lobe. Chronic L1 and T3 compression fracture deformities, both with greater than 50% loss of height anteriorly.  Surgical clips in gallbladder fossa. Subcentimeter probable cyst in the mid left kidney. Remainder of visualized portions of upper abdomen unremarkable.  Review of the MIP images confirms the above findings.  IMPRESSION: 1. Stable postop appearance of AVR and ascending thoracic aortic repair. 2. Stable 5.3 cm aneurysmal dilatation of the proximal arch and 4.1 cm proximal descending aortic aneurysm.   Electronically Signed  By: Lucrezia Europe M.D.  On: 03/25/2015 14:55     Impression:  She has had progressive enlargement of her aortic arch since surgery in 09/2012. It now measures 5.3 cm compared to 4.8 cm at that time. She is 80 years old and has multiple coexisting medical problems so replacing her aortic arch would be high risk. She also has dilation of the proximal descending aorta to 4.1 cm. I will review her scans with my colleagues to decide if debranching of her aortic arch and placement of an endovascular stent graft is an option for her. I will follow this for a while longer and plan to repeat her CTA in 6 months but if it continues to enlarge then surgical repair will be needed.   Plan:  I will see her back in 6 months with a CTA of the chest. I will review her scans with my colleagues to decide on the surgical options for her. She knows to contact me immediately if she develops chest or back pain.   Gaye Pollack, MD [facility] [phone]

## 2015-04-15 ENCOUNTER — Telehealth: Payer: Self-pay | Admitting: Cardiology

## 2015-04-15 NOTE — Telephone Encounter (Signed)
I am a bit concerned about increasing her meds with those readings in the 90s.  Have her check her BP daily x 2 wks and get the numbers from her.

## 2015-04-15 NOTE — Telephone Encounter (Signed)
F/u  Pt returning Rn phone call. Please call back and discuss.   

## 2015-04-15 NOTE — Telephone Encounter (Signed)
Discussed Dr Claris Gladden recommendations with pt.  Pt agreed to check BP daily for 10-14 days and call with the readings. Pt advised to call before then if BP consistently greater than 140/90.

## 2015-04-15 NOTE — Telephone Encounter (Signed)
Pt states her BP is spiking every now and then--this morning at 3:30AM 150/96. Pt states BP was 126/90 a short time ago.   Pt states recent BP readings: 12/12 different times of day:   12/12 99/72 12/12  90/70 12/12 150/96  12/9 131/86 12/9 138/107--standing, pt states she checked her BP again and it was lower.   Pt states she saw Dr Cyndia Bent last Wednesday about her aneurysm. Pt states Dr Cyndia Bent thought she may need to be on medication for better BP control.  Pt advised I will forward to Dr Aundra Dubin for review.

## 2015-04-15 NOTE — Telephone Encounter (Signed)
Pt c/o BP issue: STAT if pt c/o blurred vision, one-sided weakness or slurred speech  1. What are your last 5 BP readings? This am- 154/96  2. Are you having any other symptoms (ex. Dizziness, headache, blurred vision, passed out)? No, pt stated she has aneurysm   3. What is your BP issue? BP is spiking.

## 2015-04-15 NOTE — Telephone Encounter (Signed)
LMTCB

## 2015-04-23 ENCOUNTER — Encounter (HOSPITAL_COMMUNITY): Payer: Medicare Other

## 2015-05-09 ENCOUNTER — Telehealth: Payer: Self-pay | Admitting: Internal Medicine

## 2015-05-09 MED ORDER — FLUCONAZOLE 150 MG PO TABS: 150.0000 mg | ORAL_TABLET | Freq: Once | ORAL | Status: DC

## 2015-05-09 MED ORDER — AZITHROMYCIN 250 MG PO TABS: ORAL_TABLET | ORAL | Status: DC

## 2015-05-09 NOTE — Telephone Encounter (Signed)
Patient called stating she has had a cold since last week and has been running a fever all this week. I am unable to get her in this week and she is wondering if you can send her in a zpac and diflucan because she always gets oral thrush on antibiotics Can you please call her at [phone] If you do call something in her pharmacy is [facility] in [place]

## 2015-05-09 NOTE — Telephone Encounter (Signed)
Done. See meds. Pt informed  

## 2015-05-09 NOTE — Telephone Encounter (Signed)
OK a zpac #1 pack and diflucan 150 mg one tab once #1 Thx

## 2015-05-21 ENCOUNTER — Encounter (HOSPITAL_COMMUNITY)
Admission: RE | Admit: 2015-05-21 | Discharge: 2015-05-21 | Disposition: A | Discharge status: Home / Self Care | Payer: Medicare Other | Source: Ambulatory Visit | Attending: Rheumatology | Admitting: Rheumatology

## 2015-05-21 DIAGNOSIS — M069 Rheumatoid arthritis, unspecified: Secondary | ICD-10-CM | POA: Insufficient documentation

## 2015-06-03 ENCOUNTER — Ambulatory Visit (INDEPENDENT_AMBULATORY_CARE_PROVIDER_SITE_OTHER): Payer: Medicare Other | Admitting: Internal Medicine

## 2015-06-03 ENCOUNTER — Encounter: Payer: Self-pay | Admitting: Internal Medicine

## 2015-06-03 VITALS — BP 130/68 | HR 75 | Wt 164.0 lb

## 2015-06-03 DIAGNOSIS — K209 Esophagitis, unspecified without bleeding: Secondary | ICD-10-CM

## 2015-06-03 DIAGNOSIS — E538 Deficiency of other specified B group vitamins: Secondary | ICD-10-CM

## 2015-06-03 DIAGNOSIS — I712 Thoracic aortic aneurysm, without rupture, unspecified: Secondary | ICD-10-CM

## 2015-06-03 DIAGNOSIS — F329 Major depressive disorder, single episode, unspecified: Secondary | ICD-10-CM

## 2015-06-03 DIAGNOSIS — F32A Depression, unspecified: Secondary | ICD-10-CM

## 2015-06-03 MED ORDER — FLUCONAZOLE 100 MG PO TABS: ORAL_TABLET | ORAL | Status: DC

## 2015-06-03 MED ORDER — OXYCODONE HCL ER 10 MG PO T12A: 10.0000 mg | EXTENDED_RELEASE_TABLET | Freq: Two times a day (BID) | ORAL | Status: DC

## 2015-06-03 MED ORDER — OXYCODONE HCL ER 10 MG PO T12A
10.0000 mg | EXTENDED_RELEASE_TABLET | Freq: Two times a day (BID) | ORAL | Status: DC
Start: 2015-06-03 — End: 2015-06-03

## 2015-06-03 NOTE — Progress Notes (Signed)
Pre visit review using our clinic review tool, if applicable. No additional management support is needed unless otherwise documented below in the visit note. 

## 2015-06-03 NOTE — Assessment & Plan Note (Signed)
11/16  Dr Cyndia Bent: She has had progressive enlargement of her aortic arch since surgery in 09/2012. It now measures 5.3 cm compared to 4.8 cm at that time. She is 81 years old and has multiple coexisting medical problems so replacing her aortic arch would be high risk. She also has dilation of the proximal descending aorta to 4.1 cm. I will review her scans with my colleagues to decide if debranching of her aortic arch and placement of an endovascular stent graft is an option for her. I will follow this for a while longer and plan to repeat her CTA in 6 months but if it continues to enlarge then surgical repair will be needed.   Off anticoagulation

## 2015-06-03 NOTE — Assessment & Plan Note (Signed)
On B12 

## 2015-06-03 NOTE — Assessment & Plan Note (Signed)
Off meds 

## 2015-06-03 NOTE — Progress Notes (Signed)
Subjective:  Patient ID: Diane Abbott, female    DOB: [birth year]  Age: 81 y.o. MRN: [id number]  CC: No chief complaint on file.   HPI Diane Abbott presents for aortic arch aneurism, chronic pain. C/o esophagitis  Outpatient Prescriptions Prior to Visit  Medication Sig Dispense Refill  . aspirin EC 81 MG EC tablet Take 1 tablet (81 mg total) by mouth daily.    . Estradiol (VAGIFEM) 10 MCG TABS vaginal tablet INSERT ONE TABLET VAGINALLY EVERY 2 (TWO) DAYS 30 tablet 5  . ipratropium (ATROVENT) 0.06 % nasal spray Place 1-2 sprays into the nose as needed. For runny nose 15 mL 3  . loratadine (CLARITIN) 10 MG tablet Take 10 mg by mouth daily.    . meclizine (ANTIVERT) 12.5 MG tablet TAKE ONE TO TWO TABLETS BY MOUTH THREE TIMES DAILY AS NEEDED FOR DIZZINESS 60 tablet 0  . NEXIUM 40 MG capsule TAKE ONE CAPSULE BY MOUTH TWICE DAILY 60 capsule 0  . Probiotic Product (PROBIOTIC DAILY PO) Take by mouth daily.    Marland Kitchen rOPINIRole (REQUIP) 2 MG tablet TAKE ONE TABLET BY MOUTH THREE TIMES DAILY 90 tablet 5  . azithromycin (ZITHROMAX) 250 MG tablet Take 2 tablets by mouth on day 1, take 1 tablet by mouth on days 2-5. 6 tablet 0  . fluconazole (DIFLUCAN) 150 MG tablet Take 1 tablet (150 mg total) by mouth once. 1 tablet 0  . OxyCODONE (OXYCONTIN) 10 mg T12A 12 hr tablet Take 1 tablet (10 mg total) by mouth every 12 (twelve) hours. Please fill on or after 04/22/15 60 tablet 0  . diazepam (VALIUM) 2 MG tablet Take 1 tablet (2 mg total) by mouth every 12 (twelve) hours as needed for anxiety. (Patient not taking: Reported on 06/03/2015) 30 tablet 1  . diphenhydrAMINE (SOMINEX) 25 MG tablet Take 25 mg by mouth at bedtime as needed for sleep. Reported on 06/03/2015    . triamcinolone cream (KENALOG) 0.5 % Apply 1 application topically 3 (three) times daily. (Patient not taking: Reported on 06/03/2015) 45 g 1  . triazolam (HALCION) 0.125 MG tablet TAKE ONE TABLET BY MOUTH AT BEDTIME AS NEEDED (Patient not taking:  Reported on 06/03/2015) 30 tablet 3   No facility-administered medications prior to visit.    ROS Review of Systems  Constitutional: Negative for chills, activity change, appetite change, fatigue and unexpected weight change.  HENT: Negative for congestion, mouth sores and sinus pressure.   Eyes: Negative for visual disturbance.  Respiratory: Negative for cough and chest tightness.   Gastrointestinal: Positive for nausea. Negative for abdominal pain and constipation.  Genitourinary: Negative for frequency, difficulty urinating and vaginal pain.  Musculoskeletal: Positive for back pain and arthralgias. Negative for gait problem.  Skin: Negative for pallor and rash.  Neurological: Negative for dizziness, tremors, weakness, numbness and headaches.  Psychiatric/Behavioral: Positive for dysphoric mood. Negative for suicidal ideas, confusion and sleep disturbance. The patient is nervous/anxious.     Objective:  BP 130/68 mmHg  Pulse 75  Wt 164 lb (74.39 kg)  SpO2 96%  BP Readings from Last 3 Encounters:  06/03/15 130/68  04/10/15 143/88  03/18/15 160/84    Wt Readings from Last 3 Encounters:  06/03/15 164 lb (74.39 kg)  04/10/15 164 lb (74.39 kg)  03/18/15 164 lb 1.9 oz (74.444 kg)    Physical Exam  Constitutional: She appears well-developed. No distress.  HENT:  Head: Normocephalic.  Right Ear: External ear normal.  Left Ear: External ear normal.  Nose: Nose normal.  Mouth/Throat: Oropharynx is clear and moist.  Eyes: Conjunctivae are normal. Pupils are equal, round, and reactive to light. Right eye exhibits no discharge. Left eye exhibits no discharge.  Neck: Normal range of motion. Neck supple. No JVD present. No tracheal deviation present. No thyromegaly present.  Cardiovascular: Normal rate and regular rhythm.   Murmur heard. Pulmonary/Chest: No stridor. No respiratory distress. She has no wheezes.  Abdominal: Soft. Bowel sounds are normal. She exhibits no distension  and no mass. There is no tenderness. There is no rebound and no guarding.  Musculoskeletal: She exhibits tenderness. She exhibits no edema.  Lymphadenopathy:    She has no cervical adenopathy.  Neurological: She displays normal reflexes. No cranial nerve deficit. She exhibits normal muscle tone. Coordination normal.  Skin: No rash noted. No erythema.  Psychiatric: Her behavior is normal. Judgment and thought content normal.  2/6 murmur  Lab Results  Component Value Date   WBC 4.2 03/12/2015   HGB 13.0 03/12/2015   HCT 39.7 03/12/2015   PLT 130* 03/12/2015   GLUCOSE 87 03/26/2015   ALT 17 03/12/2015   AST 30 03/12/2015   NA 137 03/26/2015   K 3.7 03/26/2015   CL 103 03/26/2015   CREATININE 1.10* 03/26/2015   BUN 20 03/26/2015   CO2 25 03/26/2015   TSH 2.88 01/09/2011   INR 1.34 09/29/2012   HGBA1C 5.7* 09/27/2012    No results found.  Assessment & Plan:   Diagnoses and all orders for this visit:  Esophagitis, unspecified  B12 deficiency  Depression  Thoracic aortic aneurysm without rupture ([place])  Other orders -     Discontinue: oxyCODONE (OXYCONTIN) 10 mg 12 hr tablet; Take 1 tablet (10 mg total) by mouth every 12 (twelve) hours. Please fill on or after 06/03/15 -     fluconazole (DIFLUCAN) 100 MG tablet; Take 2 tabs qd -     oxyCODONE (OXYCONTIN) 10 mg 12 hr tablet; Take 1 tablet (10 mg total) by mouth every 12 (twelve) hours. Please fill on or after 06/01/15   I have discontinued Diane Abbott's oxyCODONE, azithromycin, and fluconazole. I have also changed her oxyCODONE. Additionally, I am having her start on fluconazole. Lastly, I am having her maintain her aspirin, Probiotic Product (PROBIOTIC DAILY PO), NEXIUM, triamcinolone cream, loratadine, triazolam, diazepam, meclizine, diphenhydrAMINE, Estradiol, rOPINIRole, ipratropium, CYANOCOBALAMIN PO, Collagen, Cholecalciferol, niacin, vitamin C, [place] Berries, and vitamin E.  Meds ordered this encounter  Medications    . CYANOCOBALAMIN PO    Sig: Take 1 each by mouth daily.  . Collagen 500 MG CAPS    Sig: Take 1 capsule by mouth daily.  . Cholecalciferol 5000 units TABS    Sig: Take 1 tablet by mouth daily.  . niacin 500 MG tablet    Sig: Take 500 mg by mouth 2 (two) times daily with a meal.  . Ascorbic Acid (VITAMIN C) 1000 MG tablet    Sig: Take 6,000 mg by mouth daily.  Marland Kitchen [place]orn Berries 565 MG CAPS    Sig: Take by mouth daily.  . vitamin E 400 UNIT capsule    Sig: Take 800 Units by mouth once a week.  Marland Kitchen DISCONTD: oxyCODONE (OXYCONTIN) 10 mg 12 hr tablet    Sig: Take 1 tablet (10 mg total) by mouth every 12 (twelve) hours. Please fill on or after 06/03/15    Dispense:  60 tablet    Refill:  0  . fluconazole (DIFLUCAN) 100 MG tablet  Sig: Take 2 tabs qd    Dispense:  60 tablet    Refill:  0  . oxyCODONE (OXYCONTIN) 10 mg 12 hr tablet    Sig: Take 1 tablet (10 mg total) by mouth every 12 (twelve) hours. Please fill on or after 06/01/15    Dispense:  60 tablet    Refill:  0     Follow-up: Return in about 3 months (around 09/01/2015) for a follow-up visit.  Walker Kehr, MD

## 2015-06-03 NOTE — Assessment & Plan Note (Signed)
Per pt - recurrent yeast esophagitis Diflucan po

## 2015-06-24 DIAGNOSIS — M2550 Pain in unspecified joint: Secondary | ICD-10-CM | POA: Diagnosis not present

## 2015-06-24 DIAGNOSIS — M79671 Pain in right foot: Secondary | ICD-10-CM | POA: Diagnosis not present

## 2015-06-24 DIAGNOSIS — M0579 Rheumatoid arthritis with rheumatoid factor of multiple sites without organ or systems involvement: Secondary | ICD-10-CM | POA: Diagnosis not present

## 2015-06-24 DIAGNOSIS — M79641 Pain in right hand: Secondary | ICD-10-CM | POA: Diagnosis not present

## 2015-06-24 DIAGNOSIS — Z79899 Other long term (current) drug therapy: Secondary | ICD-10-CM | POA: Diagnosis not present

## 2015-06-24 DIAGNOSIS — M79642 Pain in left hand: Secondary | ICD-10-CM | POA: Diagnosis not present

## 2015-06-24 DIAGNOSIS — M5137 Other intervertebral disc degeneration, lumbosacral region: Secondary | ICD-10-CM | POA: Diagnosis not present

## 2015-06-27 ENCOUNTER — Other Ambulatory Visit (HOSPITAL_COMMUNITY): Payer: Self-pay | Admitting: Rheumatology

## 2015-07-01 ENCOUNTER — Encounter (HOSPITAL_COMMUNITY): Payer: Self-pay

## 2015-07-01 ENCOUNTER — Ambulatory Visit (HOSPITAL_COMMUNITY)
Admission: RE | Admit: 2015-07-01 | Discharge: 2015-07-01 | Disposition: A | Discharge status: Home / Self Care | Payer: Medicare Other | Source: Ambulatory Visit | Attending: Rheumatology | Admitting: Rheumatology

## 2015-07-01 DIAGNOSIS — M069 Rheumatoid arthritis, unspecified: Secondary | ICD-10-CM | POA: Diagnosis not present

## 2015-07-01 LAB — COMPREHENSIVE METABOLIC PANEL
ALBUMIN: 3.7 g/dL (ref 3.5–5.0)
ALK PHOS: 62 U/L (ref 38–126)
ALT: 14 U/L (ref 14–54)
AST: 25 U/L (ref 15–41)
Anion gap: 7 (ref 5–15)
BUN: 10 mg/dL (ref 6–20)
CALCIUM: 9.1 mg/dL (ref 8.9–10.3)
CO2: 27 mmol/L (ref 22–32)
CREATININE: 1.15 mg/dL — AB (ref 0.44–1.00)
Chloride: 105 mmol/L (ref 101–111)
GFR calc Af Amer: 51 mL/min — ABNORMAL LOW (ref >60)
GFR calc non Af Amer: 44 mL/min — ABNORMAL LOW (ref >60)
GLUCOSE: 113 mg/dL — AB (ref 65–99)
Potassium: 3.7 mmol/L (ref 3.5–5.1)
SODIUM: 139 mmol/L (ref 135–145)
Total Bilirubin: 0.5 mg/dL (ref 0.3–1.2)
Total Protein: 7 g/dL (ref 6.5–8.1)

## 2015-07-01 LAB — CBC WITH DIFFERENTIAL/PLATELET
BASOS PCT: 1 %
Basophils Absolute: 0.1 10*3/uL (ref 0.0–0.1)
Eosinophils Absolute: 0.1 10*3/uL (ref 0.0–0.7)
Eosinophils Relative: 4 %
HEMATOCRIT: 35.7 % — AB (ref 36.0–46.0)
HEMOGLOBIN: 11.7 g/dL — AB (ref 12.0–15.0)
LYMPHS PCT: 31 %
Lymphs Abs: 1.1 10*3/uL (ref 0.7–4.0)
MCH: 31.5 pg (ref 26.0–34.0)
MCHC: 32.8 g/dL (ref 30.0–36.0)
MCV: 96.2 fL (ref 78.0–100.0)
MONO ABS: 0.3 10*3/uL (ref 0.1–1.0)
Monocytes Relative: 8 %
NEUTROS ABS: 2 10*3/uL (ref 1.7–7.7)
NEUTROS PCT: 55 %
Platelets: 109 10*3/uL — ABNORMAL LOW (ref 150–400)
RBC: 3.71 MIL/uL — AB (ref 3.87–5.11)
RDW: 13.4 % (ref 11.5–15.5)
WBC: 3.6 10*3/uL — AB (ref 4.0–10.5)

## 2015-07-01 MED ORDER — ACETAMINOPHEN 325 MG PO TABS: 650.0000 mg | ORAL_TABLET | Freq: Once | ORAL | Status: DC

## 2015-07-01 MED ORDER — SODIUM CHLORIDE 0.9 % IV SOLN
Freq: Once | INTRAVENOUS | Status: AC
  Administered 2015-07-01: 14:00:00 via INTRAVENOUS

## 2015-07-01 MED ORDER — SODIUM CHLORIDE 0.9 % IV SOLN
150.0000 mg | Freq: Once | INTRAVENOUS | Status: AC
  Administered 2015-07-01: 150 mg via INTRAVENOUS
  Filled 2015-07-01: qty 12

## 2015-07-01 MED ORDER — DIPHENHYDRAMINE HCL 25 MG PO CAPS
25.0000 mg | ORAL_CAPSULE | Freq: Once | ORAL | Status: DC
  Filled 2015-07-01: qty 1

## 2015-07-01 NOTE — Progress Notes (Signed)
Pt states she had lab work drawn last week, ordered by [employer].  Spoke with Amy at [facility] office and she states the Quantiferon Gold was drawn last week, no need to draw again.  Proceed with drawing the cbc and cmp today.

## 2015-07-01 NOTE — Discharge Instructions (Signed)
Golimumab injection °What is this medicine? °GOLIMUMAB (goe LIM ue mab) is used to treat rheumatoid arthritis, psoriatic arthritis, ulcerative colitis, and ankylosing spondylitis. °This medicine may be used for other purposes; ask your health care provider or pharmacist if you have questions. °What should I tell my health care provider before I take this medicine? °They need to know if you have any of these conditions: °-cancer °-diabetes °-heart disease °-hepatitis B or history of hepatitis B infection °-immune system problems °-infection or history of infections °-low blood counts like low white cell, platelet, or red cell counts °-multiple sclerosis °-recently received or scheduled to receive a vaccine °-scheduled to have surgery °-tuberculosis, a positive skin test for tuberculosis or have recently been in close contact with someone who has tuberculosis °-an unusual reaction to golimumab, other medicines, latex, rubber, foods, dyes, or preservatives °-pregnant or trying to get pregnant °-breast-feeding °How should I use this medicine? °This medicine is for injection under the skin. You will be taught how to prepare and give this medicine. Use exactly as directed. Take your medicine at regular intervals. Do not take your medicine more often than directed. More information is available by calling [phone]. °It is important that you put your used needles and syringes in a special sharps container. Do not put them in a trash can. If you do not have a sharps container, call your pharmacist or healthcare provider to get one. °A special MedGuide will be given to you by the pharmacist with each prescription and refill. Be sure to read this information carefully each time. °Talk to your pediatrician regarding the use of this medicine in children. Special care may be needed. °Overdosage: If you think you have taken too much of this medicine contact a poison control center or emergency room at once. °NOTE: This  medicine is only for you. Do not share this medicine with others. °What if I miss a dose? °If you miss a dose, take it as soon as you can. If it is almost time for your next dose, take only that dose. Do not take double or extra doses. Call your doctor or health care professional if you are not sure how to handle a missed dose. °What may interact with this medicine? °Do not take this medicine with any of the following medications: °-abatacept °-adalimumab °-anakinra °-certolizumab °-etanercept °-infliximab °-live virus vaccines °-rilonacept °This medicine may also interact with the following medications: °-cyclosporine °-rituximab °-theophylline °-vaccines °-warfarin °This list may not describe all possible interactions. Give your health care provider a list of all the medicines, herbs, non-prescription drugs, or dietary supplements you use. Also tell them if you smoke, drink alcohol, or use illegal drugs. Some items may interact with your medicine. °What should I watch for while using this medicine? °Visit your doctor or health care professional for regular checks on your progress. Tell your doctor or healthcare professional if your symptoms do not start to get better or if they get worse. °You will be tested for tuberculosis (TB) before you start this medicine. If your doctor prescribes any medicine for TB, you should start taking the TB medicine before starting this medicine. Make sure to finish the full course of TB medicine. °Call your doctor or health care professional if you get a cold or other infection while receiving this medicine. Do not treat yourself. This medicine may decrease your body's ability to fight infection. °Talk to your doctor about your risk of cancer. You may be more at risk for certain types   of cancers if you take this medicine. °What side effects may I notice from receiving this medicine? °Side effects that you should report to your doctor or health care professional as soon as  possible: °-allergic reactions like skin rash, itching or hives, swelling of the face, lips, or tongue °-breathing problems °-changes in vision °-chest pain °-dark urine °-fever, chills, or any other sign of infection °-light-colored stools °-muscle pain or weakness °-numbness or tingling °-red, scaly patches or raised bumps on the skin °-right upper belly pain °-swelling of the ankles °-swollen lymph nodes in the neck, underarm, or groin areas °-unexplained weight loss °-unusual bleeding or bruising °-unusually weak or tired °-yellowing of the eyes or skin °Side effects that usually do not require medical attention (report to your doctor or health care professional if they continue or are bothersome): °-dizziness °-nausea °-redness, itching, swelling, or bruising at site where injected °This list may not describe all possible side effects. Call your doctor for medical advice about side effects. You may report side effects to FDA at 1-800-FDA-1088. °Where should I keep my medicine? °Keep out of the reach of children. °Store in the original container and in the refrigerator between 2 and 8 degrees C (36 and 46 degrees F). Do not freeze. Protect from light. Throw away any unused medicine after the expiration date. °NOTE: This sheet is a summary. It may not cover all possible information. If you have questions about this medicine, talk to your doctor, pharmacist, or health care provider. °  °© 2016, [employer]vier/[employer]. (2011-11-24 13:41:25) ° °

## 2015-07-19 ENCOUNTER — Telehealth: Payer: Self-pay | Admitting: Internal Medicine

## 2015-07-19 NOTE — Telephone Encounter (Signed)
Pls ask Dr Harley Hallmark office to put pt on cancellation list Thx

## 2015-07-19 NOTE — Telephone Encounter (Signed)
Pt called and said that she is hurting really back and wanted to see if we could get her in quicker to Dr Joya Salm office. She said she is having trouble walking . Is this anything we can help her with?

## 2015-07-22 NOTE — Telephone Encounter (Signed)
Patient has called back and is wondering if you were able to get her in to Dr.Botero's office.  She states she is totally bed ridden now. Can you please call her

## 2015-07-23 ENCOUNTER — Encounter (HOSPITAL_COMMUNITY): Payer: Self-pay | Admitting: Emergency Medicine

## 2015-07-23 ENCOUNTER — Emergency Department (HOSPITAL_COMMUNITY): Payer: Medicare Other

## 2015-07-23 ENCOUNTER — Inpatient Hospital Stay (HOSPITAL_COMMUNITY)
Admission: EM | Admit: 2015-07-23 | Discharge: 2015-07-29 | DRG: 551 | Disposition: A | Discharge status: Skilled Nursing Facility | Payer: Medicare Other | Attending: Internal Medicine | Admitting: Internal Medicine

## 2015-07-23 DIAGNOSIS — Z8679 Personal history of other diseases of the circulatory system: Secondary | ICD-10-CM | POA: Diagnosis not present

## 2015-07-23 DIAGNOSIS — S29002A Unspecified injury of muscle and tendon of back wall of thorax, initial encounter: Secondary | ICD-10-CM | POA: Diagnosis not present

## 2015-07-23 DIAGNOSIS — F329 Major depressive disorder, single episode, unspecified: Secondary | ICD-10-CM | POA: Diagnosis not present

## 2015-07-23 DIAGNOSIS — E2740 Unspecified adrenocortical insufficiency: Secondary | ICD-10-CM | POA: Diagnosis present

## 2015-07-23 DIAGNOSIS — R278 Other lack of coordination: Secondary | ICD-10-CM | POA: Diagnosis not present

## 2015-07-23 DIAGNOSIS — M4647 Discitis, unspecified, lumbosacral region: Secondary | ICD-10-CM | POA: Diagnosis not present

## 2015-07-23 DIAGNOSIS — M4626 Osteomyelitis of vertebra, lumbar region: Secondary | ICD-10-CM | POA: Diagnosis not present

## 2015-07-23 DIAGNOSIS — Z95828 Presence of other vascular implants and grafts: Secondary | ICD-10-CM

## 2015-07-23 DIAGNOSIS — R262 Difficulty in walking, not elsewhere classified: Secondary | ICD-10-CM | POA: Diagnosis present

## 2015-07-23 DIAGNOSIS — K5900 Constipation, unspecified: Secondary | ICD-10-CM | POA: Diagnosis not present

## 2015-07-23 DIAGNOSIS — Z880 Allergy status to penicillin: Secondary | ICD-10-CM | POA: Diagnosis not present

## 2015-07-23 DIAGNOSIS — Z9841 Cataract extraction status, right eye: Secondary | ICD-10-CM | POA: Diagnosis not present

## 2015-07-23 DIAGNOSIS — E119 Type 2 diabetes mellitus without complications: Secondary | ICD-10-CM | POA: Diagnosis not present

## 2015-07-23 DIAGNOSIS — M19012 Primary osteoarthritis, left shoulder: Secondary | ICD-10-CM | POA: Diagnosis present

## 2015-07-23 DIAGNOSIS — K589 Irritable bowel syndrome without diarrhea: Secondary | ICD-10-CM | POA: Diagnosis present

## 2015-07-23 DIAGNOSIS — K219 Gastro-esophageal reflux disease without esophagitis: Secondary | ICD-10-CM | POA: Diagnosis present

## 2015-07-23 DIAGNOSIS — S22000A Wedge compression fracture of unspecified thoracic vertebra, initial encounter for closed fracture: Secondary | ICD-10-CM | POA: Diagnosis present

## 2015-07-23 DIAGNOSIS — K6812 Psoas muscle abscess: Secondary | ICD-10-CM | POA: Diagnosis present

## 2015-07-23 DIAGNOSIS — Z86711 Personal history of pulmonary embolism: Secondary | ICD-10-CM

## 2015-07-23 DIAGNOSIS — N183 Chronic kidney disease, stage 3 (moderate): Secondary | ICD-10-CM | POA: Diagnosis not present

## 2015-07-23 DIAGNOSIS — R52 Pain, unspecified: Secondary | ICD-10-CM | POA: Diagnosis not present

## 2015-07-23 DIAGNOSIS — G061 Intraspinal abscess and granuloma: Secondary | ICD-10-CM | POA: Diagnosis present

## 2015-07-23 DIAGNOSIS — B379 Candidiasis, unspecified: Secondary | ICD-10-CM | POA: Diagnosis not present

## 2015-07-23 DIAGNOSIS — M069 Rheumatoid arthritis, unspecified: Secondary | ICD-10-CM | POA: Diagnosis not present

## 2015-07-23 DIAGNOSIS — Z885 Allergy status to narcotic agent status: Secondary | ICD-10-CM | POA: Diagnosis not present

## 2015-07-23 DIAGNOSIS — Z96653 Presence of artificial knee joint, bilateral: Secondary | ICD-10-CM | POA: Diagnosis present

## 2015-07-23 DIAGNOSIS — Z7982 Long term (current) use of aspirin: Secondary | ICD-10-CM | POA: Diagnosis not present

## 2015-07-23 DIAGNOSIS — G2581 Restless legs syndrome: Secondary | ICD-10-CM | POA: Diagnosis not present

## 2015-07-23 DIAGNOSIS — B9689 Other specified bacterial agents as the cause of diseases classified elsewhere: Secondary | ICD-10-CM | POA: Diagnosis not present

## 2015-07-23 DIAGNOSIS — M5489 Other dorsalgia: Secondary | ICD-10-CM | POA: Diagnosis not present

## 2015-07-23 DIAGNOSIS — R700 Elevated erythrocyte sedimentation rate: Secondary | ICD-10-CM | POA: Diagnosis present

## 2015-07-23 DIAGNOSIS — Z90710 Acquired absence of both cervix and uterus: Secondary | ICD-10-CM

## 2015-07-23 DIAGNOSIS — M4854XD Collapsed vertebra, not elsewhere classified, thoracic region, subsequent encounter for fracture with routine healing: Secondary | ICD-10-CM | POA: Diagnosis not present

## 2015-07-23 DIAGNOSIS — Z888 Allergy status to other drugs, medicaments and biological substances status: Secondary | ICD-10-CM | POA: Diagnosis not present

## 2015-07-23 DIAGNOSIS — G062 Extradural and subdural abscess, unspecified: Secondary | ICD-10-CM

## 2015-07-23 DIAGNOSIS — Z953 Presence of xenogenic heart valve: Secondary | ICD-10-CM | POA: Diagnosis not present

## 2015-07-23 DIAGNOSIS — M479 Spondylosis, unspecified: Secondary | ICD-10-CM | POA: Diagnosis present

## 2015-07-23 DIAGNOSIS — Z9842 Cataract extraction status, left eye: Secondary | ICD-10-CM

## 2015-07-23 DIAGNOSIS — M8580 Other specified disorders of bone density and structure, unspecified site: Secondary | ICD-10-CM | POA: Diagnosis present

## 2015-07-23 DIAGNOSIS — M47816 Spondylosis without myelopathy or radiculopathy, lumbar region: Secondary | ICD-10-CM | POA: Diagnosis not present

## 2015-07-23 DIAGNOSIS — D6489 Other specified anemias: Secondary | ICD-10-CM | POA: Diagnosis present

## 2015-07-23 DIAGNOSIS — D519 Vitamin B12 deficiency anemia, unspecified: Secondary | ICD-10-CM | POA: Diagnosis not present

## 2015-07-23 DIAGNOSIS — M6281 Muscle weakness (generalized): Secondary | ICD-10-CM | POA: Diagnosis not present

## 2015-07-23 DIAGNOSIS — M4640 Discitis, unspecified, site unspecified: Secondary | ICD-10-CM | POA: Diagnosis not present

## 2015-07-23 DIAGNOSIS — Z96612 Presence of left artificial shoulder joint: Secondary | ICD-10-CM | POA: Diagnosis present

## 2015-07-23 DIAGNOSIS — J189 Pneumonia, unspecified organism: Secondary | ICD-10-CM | POA: Diagnosis not present

## 2015-07-23 DIAGNOSIS — M545 Low back pain: Secondary | ICD-10-CM | POA: Diagnosis not present

## 2015-07-23 DIAGNOSIS — Z79899 Other long term (current) drug therapy: Secondary | ICD-10-CM

## 2015-07-23 DIAGNOSIS — L299 Pruritus, unspecified: Secondary | ICD-10-CM | POA: Diagnosis not present

## 2015-07-23 DIAGNOSIS — Z91040 Latex allergy status: Secondary | ICD-10-CM

## 2015-07-23 DIAGNOSIS — N3090 Cystitis, unspecified without hematuria: Secondary | ICD-10-CM | POA: Diagnosis not present

## 2015-07-23 DIAGNOSIS — D649 Anemia, unspecified: Secondary | ICD-10-CM | POA: Diagnosis present

## 2015-07-23 DIAGNOSIS — M549 Dorsalgia, unspecified: Secondary | ICD-10-CM | POA: Diagnosis present

## 2015-07-23 DIAGNOSIS — M5440 Lumbago with sciatica, unspecified side: Secondary | ICD-10-CM | POA: Diagnosis not present

## 2015-07-23 DIAGNOSIS — M1990 Unspecified osteoarthritis, unspecified site: Secondary | ICD-10-CM | POA: Diagnosis present

## 2015-07-23 DIAGNOSIS — B370 Candidal stomatitis: Secondary | ICD-10-CM | POA: Diagnosis not present

## 2015-07-23 DIAGNOSIS — M797 Fibromyalgia: Secondary | ICD-10-CM | POA: Diagnosis present

## 2015-07-23 DIAGNOSIS — S22000D Wedge compression fracture of unspecified thoracic vertebra, subsequent encounter for fracture with routine healing: Secondary | ICD-10-CM | POA: Diagnosis present

## 2015-07-23 DIAGNOSIS — M4646 Discitis, unspecified, lumbar region: Secondary | ICD-10-CM | POA: Diagnosis not present

## 2015-07-23 DIAGNOSIS — S22030A Wedge compression fracture of third thoracic vertebra, initial encounter for closed fracture: Secondary | ICD-10-CM | POA: Diagnosis not present

## 2015-07-23 DIAGNOSIS — I959 Hypotension, unspecified: Secondary | ICD-10-CM | POA: Diagnosis not present

## 2015-07-23 DIAGNOSIS — E559 Vitamin D deficiency, unspecified: Secondary | ICD-10-CM | POA: Diagnosis not present

## 2015-07-23 LAB — CBC WITH DIFFERENTIAL/PLATELET
Basophils Absolute: 0 10*3/uL (ref 0.0–0.1)
Basophils Relative: 1 %
Eosinophils Absolute: 0.1 10*3/uL (ref 0.0–0.7)
Eosinophils Relative: 3 %
HEMATOCRIT: 34.5 % — AB (ref 36.0–46.0)
HEMOGLOBIN: 11.7 g/dL — AB (ref 12.0–15.0)
LYMPHS ABS: 1 10*3/uL (ref 0.7–4.0)
LYMPHS PCT: 22 %
MCH: 32.3 pg (ref 26.0–34.0)
MCHC: 33.9 g/dL (ref 30.0–36.0)
MCV: 95.3 fL (ref 78.0–100.0)
Monocytes Absolute: 0.4 10*3/uL (ref 0.1–1.0)
Monocytes Relative: 10 %
NEUTROS PCT: 64 %
Neutro Abs: 2.9 10*3/uL (ref 1.7–7.7)
Platelets: 131 10*3/uL — ABNORMAL LOW (ref 150–400)
RBC: 3.62 MIL/uL — AB (ref 3.87–5.11)
RDW: 13.4 % (ref 11.5–15.5)
WBC: 4.5 10*3/uL (ref 4.0–10.5)

## 2015-07-23 LAB — URINALYSIS, ROUTINE W REFLEX MICROSCOPIC
Bilirubin Urine: NEGATIVE
Glucose, UA: NEGATIVE mg/dL
KETONES UR: NEGATIVE mg/dL
LEUKOCYTES UA: NEGATIVE
Nitrite: NEGATIVE
PROTEIN: NEGATIVE mg/dL
Specific Gravity, Urine: 1.005 (ref 1.005–1.030)
pH: 6.5 (ref 5.0–8.0)

## 2015-07-23 LAB — URINE MICROSCOPIC-ADD ON

## 2015-07-23 LAB — BASIC METABOLIC PANEL
Anion gap: 7 (ref 5–15)
BUN: 18 mg/dL (ref 6–20)
CALCIUM: 8.8 mg/dL — AB (ref 8.9–10.3)
CHLORIDE: 102 mmol/L (ref 101–111)
CO2: 30 mmol/L (ref 22–32)
Creatinine, Ser: 1.09 mg/dL — ABNORMAL HIGH (ref 0.44–1.00)
GFR calc Af Amer: 54 mL/min — ABNORMAL LOW (ref >60)
GFR, EST NON AFRICAN AMERICAN: 47 mL/min — AB (ref >60)
GLUCOSE: 99 mg/dL (ref 65–99)
POTASSIUM: 3.7 mmol/L (ref 3.5–5.1)
Sodium: 139 mmol/L (ref 135–145)

## 2015-07-23 MED ORDER — DEXTROSE 5 % IV SOLN
2.0000 g | Freq: Two times a day (BID) | INTRAVENOUS | Status: DC
  Administered 2015-07-23 – 2015-07-24 (×2): 2 g via INTRAVENOUS
  Filled 2015-07-23 (×2): qty 2

## 2015-07-23 MED ORDER — DIPHENHYDRAMINE HCL 25 MG PO CAPS: 25.0000 mg | ORAL_CAPSULE | Freq: Every evening | ORAL | Status: DC | PRN

## 2015-07-23 MED ORDER — METHOCARBAMOL 500 MG PO TABS: 500.0000 mg | ORAL_TABLET | Freq: Three times a day (TID) | ORAL | Status: DC | PRN

## 2015-07-23 MED ORDER — ROPINIROLE HCL 1 MG PO TABS
2.0000 mg | ORAL_TABLET | Freq: Three times a day (TID) | ORAL | Status: DC
  Administered 2015-07-23 – 2015-07-29 (×15): 2 mg via ORAL
  Filled 2015-07-23 (×17): qty 2

## 2015-07-23 MED ORDER — DIAZEPAM 2 MG PO TABS
2.0000 mg | ORAL_TABLET | Freq: Two times a day (BID) | ORAL | Status: DC | PRN
  Administered 2015-07-24 – 2015-07-29 (×4): 2 mg via ORAL
  Filled 2015-07-23 (×4): qty 1

## 2015-07-23 MED ORDER — ONDANSETRON HCL 4 MG PO TABS: 4.0000 mg | ORAL_TABLET | Freq: Four times a day (QID) | ORAL | Status: DC | PRN

## 2015-07-23 MED ORDER — FENTANYL CITRATE (PF) 100 MCG/2ML IJ SOLN
50.0000 ug | Freq: Once | INTRAMUSCULAR | Status: AC
  Administered 2015-07-23: 50 ug via INTRAVENOUS
  Filled 2015-07-23: qty 2

## 2015-07-23 MED ORDER — VANCOMYCIN HCL 10 G IV SOLR
1500.0000 mg | Freq: Once | INTRAVENOUS | Status: AC
  Administered 2015-07-23: 1500 mg via INTRAVENOUS
  Filled 2015-07-23: qty 1500

## 2015-07-23 MED ORDER — DIPHENHYDRAMINE HCL (SLEEP) 25 MG PO TABS
25.0000 mg | ORAL_TABLET | Freq: Every evening | ORAL | Status: DC | PRN
Start: 2015-07-23 — End: 2015-07-23

## 2015-07-23 MED ORDER — ONDANSETRON HCL 4 MG/2ML IJ SOLN: 4.0000 mg | Freq: Four times a day (QID) | INTRAMUSCULAR | Status: DC | PRN

## 2015-07-23 MED ORDER — ASPIRIN EC 81 MG PO TBEC
81.0000 mg | DELAYED_RELEASE_TABLET | Freq: Every day | ORAL | Status: DC
  Administered 2015-07-23 – 2015-07-29 (×7): 81 mg via ORAL
  Filled 2015-07-23 (×7): qty 1

## 2015-07-23 MED ORDER — ZOLPIDEM TARTRATE 5 MG PO TABS: 5.0000 mg | ORAL_TABLET | Freq: Every evening | ORAL | Status: DC | PRN

## 2015-07-23 MED ORDER — SODIUM CHLORIDE 0.9 % IV SOLN
INTRAVENOUS | Status: DC
  Administered 2015-07-23 – 2015-07-24 (×3): via INTRAVENOUS
  Administered 2015-07-25: 1000 mL via INTRAVENOUS
  Administered 2015-07-26: 07:00:00 via INTRAVENOUS

## 2015-07-23 MED ORDER — ENOXAPARIN SODIUM 40 MG/0.4ML ~~LOC~~ SOLN
40.0000 mg | SUBCUTANEOUS | Status: DC
  Administered 2015-07-23 – 2015-07-28 (×6): 40 mg via SUBCUTANEOUS
  Filled 2015-07-23 (×6): qty 0.4

## 2015-07-23 MED ORDER — HYDROMORPHONE HCL 1 MG/ML IJ SOLN
0.5000 mg | INTRAMUSCULAR | Status: DC | PRN
  Administered 2015-07-23: 0.5 mg via INTRAVENOUS
  Administered 2015-07-24 (×3): 1 mg via INTRAVENOUS
  Filled 2015-07-23 (×4): qty 1

## 2015-07-23 MED ORDER — HYDROMORPHONE HCL 1 MG/ML IJ SOLN
1.0000 mg | Freq: Once | INTRAMUSCULAR | Status: AC
  Administered 2015-07-23: 1 mg via INTRAVENOUS
  Filled 2015-07-23: qty 1

## 2015-07-23 MED ORDER — IPRATROPIUM BROMIDE 0.06 % NA SOLN
2.0000 | NASAL | Status: DC | PRN
  Filled 2015-07-23: qty 15

## 2015-07-23 MED ORDER — OXYCODONE HCL ER 10 MG PO T12A
10.0000 mg | EXTENDED_RELEASE_TABLET | Freq: Two times a day (BID) | ORAL | Status: DC
  Administered 2015-07-23 – 2015-07-26 (×6): 10 mg via ORAL
  Filled 2015-07-23 (×7): qty 1

## 2015-07-23 MED ORDER — VANCOMYCIN HCL IN DEXTROSE 1-5 GM/200ML-% IV SOLN
1000.0000 mg | INTRAVENOUS | Status: DC
  Filled 2015-07-23: qty 200

## 2015-07-23 MED ORDER — ROPINIROLE HCL 1 MG PO TABS: 2.0000 mg | ORAL_TABLET | Freq: Three times a day (TID) | ORAL | Status: DC

## 2015-07-23 NOTE — ED Notes (Signed)
Cleaned organ at church and clean/waxed daughter car about 7 days ago.  Pt has been in bed for last 6-7 days, using depends because she has not been able to walk.  History of OA and RA.

## 2015-07-23 NOTE — ED Provider Notes (Signed)
CSN: [id number]     Arrival date & time 07/23/15  1424 History   First MD Initiated Contact with Patient 07/23/15 1510    Chief Complaint  Patient presents with  . Back Pain   HPI Patient presented to the emergency room with complaints of back pain. Patient is generally very active. She has history of osteoarthritis and rheumatoid arthritis but generally does not have any issues with pain in her back. He was washing a car about 7 days ago. The next morning she started having pain in her lower back. The last week she's had lower back pain that radiates to both sides. She does not have any thoracic or neck pain. She denies any abdominal pain. No numbness or weakness in her extremities. Patient has taken her regular medications without relief. Past Medical History  Diagnosis Date  . Fibromyalgia   . Pulmonary embolism ([place])   . RA (rheumatoid arthritis) ([place])   . Vitamin B 12 deficiency   . Vitamin D deficiency   . Osteoarthritis   . Osteopenia   . Adrenal insufficiency ([place])   . IBS (irritable bowel syndrome)   . History of blood clots 2008    below knee  . Diabetes mellitus type II     pt denies this 03-05-11  . Esophagitis   . Depression   . GERD (gastroesophageal reflux disease)   . Connective tissue disorder ([place])   . Cataracts, bilateral     more in left than right  . Osteoarthritis of left shoulder 12/07/2011  . Heart murmur   . Leaky heart valve   . Anginal pain [facility])     "comes and goes has occured since 2012"  . Bronchitis Jan 2013-March 2013    severe  . Shortness of breath     comes from aneurysm  . Vertigo     hx of  . Urinary leakage     when coughing  . Sliding hiatal hernia   . Fatty liver   . History of echocardiogram     Echo 11/16: Mild LVH, EF 60-65%, normal wall motion, grade 1 diastolic dysfunction, bioprosthetic AVR with mean gradient 21 mmHg and no regurgitation, proximal aortic arch aneurysm 49 mm, moderate TR, PASP 31 mmHg   Past Surgical History   Procedure Laterality Date  . Abdominal hysterectomy    . Total knee arthroplasty      bilateral  . Cholecystectomy  2003  . Tonsillectomy  1941  . Wrist surgery      bilateral  . Bilateral oophorectomy    . Tummy tuck    . Cosmetic surgery      Tummy tuck  . Total shoulder arthroplasty  12/07/2011    Procedure: TOTAL SHOULDER ARTHROPLASTY;  Surgeon: Johnny Bridge, MD;  Location: [facility];  Service: Orthopedics;  Laterality: Left;  left total shoulder artthroplasty  . Joint replacement Bilateral   . Cardiac catheterization  09/15/12    no PCI  . Colonoscopy    . Breast biopsy Left   . Skin cancer excision Right     cheek  . Eye surgery Bilateral     Cataract  . [place] procedure N/A 09/29/2012    Procedure: BENTALL PROCEDURE;  Surgeon: Gaye Pollack, MD;  Location: [facility];  Service: Open Heart Surgery;  Laterality: N/A;  WITH CIRC ARREST  . Intraoperative transesophageal echocardiogram N/A 09/29/2012    Procedure: INTRAOPERATIVE TRANSESOPHAGEAL ECHOCARDIOGRAM;  Surgeon: Gaye Pollack, MD;  Location: [facility] OR;  Service: Open  Heart Surgery;  Laterality: N/A;  . Aorta surgery  12/2012  . Appendectomy  1956   Family History  Problem Relation Age of Onset  . Ovarian cancer Mother   . Heart disease Maternal Grandmother   . Clotting disorder    . Breast cancer    . Diabetes Mother   . Pancreatic cancer    . COPD Father   . Colon cancer Neg Hx    Social History  Substance Use Topics  . Smoking status: Never Smoker   . Smokeless tobacco: Never Used  . Alcohol Use: Yes     Comment: occassional   OB History    No data available     Review of Systems  All other systems reviewed and are negative.     Allergies  Codeine; Pramipexole dihydrochloride; Rofecoxib; Arava; Clonazepam; Diclofenac sodium; Venlafaxine; Alprazolam; Darvocet; Duloxetine; Gabapentin; Latex; Morphine and related; Nortriptyline hcl; Penicillins; and Prednisone  Home Medications   Prior to Admission  medications   Medication Sig Start Date End Date Taking? Authorizing Provider  Ascorbic Acid (VITAMIN C) 1000 MG tablet Take 6,000 mg by mouth daily.   Yes Historical Provider, MD  aspirin EC 81 MG EC tablet Take 1 tablet (81 mg total) by mouth daily. Patient taking differently: Take 81-162 mg by mouth 2 (two) times daily. 2 tablets in the morning and 1 tablet at bedtime 10/05/12  Yes Erin R Barrett, PA-C  Cholecalciferol 5000 units TABS Take 1 tablet by mouth daily.   Yes Historical Provider, MD  Collagen 500 MG CAPS Take 1 capsule by mouth daily.   Yes Historical Provider, MD  CYANOCOBALAMIN PO Take 1 tablet by mouth daily.    Yes Historical Provider, MD  diazepam (VALIUM) 2 MG tablet Take 1 tablet (2 mg total) by mouth every 12 (twelve) hours as needed for anxiety. 05/30/14  Yes Aleksei Plotnikov V, MD  diphenhydrAMINE (SOMINEX) 25 MG tablet Take 25 mg by mouth at bedtime as needed for itching. Reported on 06/03/2015   Yes Historical Provider, MD  Estradiol (VAGIFEM) 10 MCG TABS vaginal tablet INSERT ONE TABLET VAGINALLY EVERY 2 (TWO) DAYS 11/29/14  Yes Aleksei Plotnikov V, MD  [place] Berries 565 MG CAPS Take by mouth daily.   Yes Historical Provider, MD  ipratropium (ATROVENT) 0.06 % nasal spray Place 1-2 sprays into the nose as needed. For runny nose Patient taking differently: Place 1-2 sprays into the nose as needed for rhinitis. For runny nose 03/27/15  Yes Aleksei Plotnikov V, MD  meclizine (ANTIVERT) 12.5 MG tablet TAKE ONE TO TWO TABLETS BY MOUTH THREE TIMES DAILY AS NEEDED FOR DIZZINESS 07/04/14  Yes Aleksei Plotnikov V, MD  niacin 500 MG tablet Take 500 mg by mouth 2 (two) times daily with a meal.   Yes Historical Provider, MD  oxyCODONE (OXYCONTIN) 10 mg 12 hr tablet Take 1 tablet (10 mg total) by mouth every 12 (twelve) hours. Please fill on or after 06/01/15 06/03/15  Yes Aleksei Plotnikov V, MD  rOPINIRole (REQUIP) 2 MG tablet TAKE ONE TABLET BY MOUTH THREE TIMES DAILY 01/21/15  Yes Aleksei  Plotnikov V, MD  vitamin E 400 UNIT capsule Take 800 Units by mouth once a week.   Yes Historical Provider, MD  NEXIUM 40 MG capsule TAKE ONE CAPSULE BY MOUTH TWICE DAILY Patient not taking: Reported on 07/23/2015 02/06/13   Aleksei Plotnikov V, MD   BP 125/54 mmHg  Pulse 121  Temp(Src) 98 F (36.7 C) (Oral)  Resp 18  Ht  5\' 2"  (1.575 m)  Wt 73.483 kg  BMI 29.62 kg/m2  SpO2 95% Physical Exam  Constitutional: She appears well-developed and well-nourished. No distress.  HENT:  Head: Normocephalic and atraumatic.  Right Ear: External ear normal.  Left Ear: External ear normal.  Eyes: Conjunctivae are normal. Right eye exhibits no discharge. Left eye exhibits no discharge. No scleral icterus.  Neck: Neck supple. No tracheal deviation present.  Cardiovascular: Normal rate, regular rhythm and intact distal pulses.   Pulmonary/Chest: Effort normal and breath sounds normal. No stridor. No respiratory distress. She has no wheezes. She has no rales.  Abdominal: Soft. Bowel sounds are normal. She exhibits no distension. There is no tenderness. There is no rebound and no guarding.  Musculoskeletal: She exhibits no edema.       Cervical back: Normal.       Thoracic back: Normal.       Lumbar back: She exhibits tenderness and bony tenderness. She exhibits no swelling, no edema and no deformity.  Neurological: She is alert. She has normal strength. No cranial nerve deficit (no facial droop, extraocular movements intact, no slurred speech) or sensory deficit. She exhibits normal muscle tone. She displays no seizure activity. Coordination normal.  Skin: Skin is warm and dry. No rash noted.  Psychiatric: She has a normal mood and affect.  Nursing note and vitals reviewed.   ED Course  Procedures (including critical care time) Labs Review Labs Reviewed  CBC WITH DIFFERENTIAL/PLATELET - Abnormal; Notable for the following:    RBC 3.62 (*)    Hemoglobin 11.7 (*)    HCT 34.5 (*)    Platelets 131  (*)    All other components within normal limits  BASIC METABOLIC PANEL - Abnormal; Notable for the following:    Creatinine, Ser 1.09 (*)    Calcium 8.8 (*)    GFR calc non Af Amer 47 (*)    GFR calc Af Amer 54 (*)    All other components within normal limits  URINALYSIS, ROUTINE W REFLEX MICROSCOPIC (NOT AT [facility]) - Abnormal; Notable for the following:    Hgb urine dipstick TRACE (*)    All other components within normal limits  URINE MICROSCOPIC-ADD ON - Abnormal; Notable for the following:    Squamous Epithelial / LPF 0-5 (*)    Bacteria, UA RARE (*)    All other components within normal limits    Imaging Review Dg Thoracic Spine 2 View  07/23/2015  CLINICAL DATA:  Low back injury EXAM: THORACIC SPINE 2 VIEWS COMPARISON:  03/25/2015 FINDINGS: T3 and L1 compression fractures are stable. Osteopenia. No new fracture. Degenerative changes and slight dextroscoliosis are stable. IMPRESSION: Stable T3 and L1 compression fractures.  No acute bony pathology. Electronically Signed   By: Marybelle Killings M.D.   On: 07/23/2015 17:24   Ct Lumbar Spine Wo Contrast  07/23/2015  CLINICAL DATA:  Cleaned organ at church and clean/waxed daughter car about 7 days ago. Pt has been in bed for last 6-7 days, using depends because she has not been able to walk. EXAM: CT LUMBAR SPINE WITHOUT CONTRAST TECHNIQUE: Multidetector CT imaging of the lumbar spine was performed without intravenous contrast administration. Multiplanar CT image reconstructions were also generated. COMPARISON:  08/24/2012 FINDINGS: Progressive compression deformity L1 vertebral body, with loss of about 75%. Interval progressive sclerosis of L1, with loss of inferior endplate cortex. Also, loss of superior endplate cortex of L2, with sclerosis beneath the superior L2 endplate. There is erosive change on both  sides of the L1-2 disk. There is increased attenuation in the paraspinous soft tissues at the L1-2 level with evidence of a right paraspinous  mass. Again seen is retropulsion of L1 posterior cortex, causing mild canal narrowing. L3, L4, and L5 vertebral bodies stable. L1-2: Disc bulge and moderate facet hypertrophy causes moderate canal narrowing. L2-3: There is vacuum phenomenon in the disc. There is significant disc bulge with facet hypertrophy. There is significant canal and foraminal narrowing bilaterally. L3-4: Moderate facet hypertrophy. Mild disc bulge. Mild to moderate canal narrowing. Mild foraminal narrowing. L4-5: Severe facet hypertrophy. Moderate disc bulge. Calcification of the ligamentum flavum. Severe canal narrowing. Moderate foraminal narrowing. L5-S1: Severe facet hypertrophy. Mild to moderate disc bulge. Mild canal narrowing. Mild foraminal narrowing bilaterally. Moderate calcification of the aortoiliac vessels. IMPRESSION: In addition to significant canal and foraminal narrowing throughout the lumbar spine due to disk and facet degenerative changes, there is further progressive compressive deformity of L1, and there is evidence of diskitis involving the L1-2 disk with changes in the adjoining endplates concerning for osteomyelitis. A right L1-2 paraspinous soft tissue mass is likely related to inflammation. MRI of the lumbar spine without and with contrast suggested to evaluate further. Electronically Signed   By: Skipper Cliche M.D.   On: 07/23/2015 17:39   I have personally reviewed and evaluated these images and lab results as part of my medical decision-making.  Medications  fentaNYL (SUBLIMAZE) injection 50 mcg (not administered)  fentaNYL (SUBLIMAZE) injection 50 mcg (50 mcg Intravenous Given 07/23/15 1643)  cefepime and vancomycin ordered per pharmacy consult   MDM   Final diagnoses:  Discitis of lumbosacral region    Patient's CT scan shows possibility of discitis and osteomyelitis. Patient also has canal narrowing suggestive of  spinal stenosis.  Patient's afebrile and her white blood cell count is not  elevated however I am concerned about these findings and the possibility of infection.  I will consult with neurosurgery down in [place] at [facility]. I think the patient will require admission to the hospital and further evaluation including MRI.  Discussed with Dr Kathyrn Sheriff.  Will need to be on IV abx and need MRI.  Would not necessarily need neurosurgery.  Dorie Rank, MD 07/23/15 [phone]

## 2015-07-23 NOTE — Progress Notes (Signed)
Pharmacy Antibiotic Note  Diane Abbott is a 81 y.o. female admitted on 07/23/2015 with possible discitis/osteomyelitis.  Pharmacy has been consulted for Vancomycin and cefepime dosing. CT scan shows possibility of discitis and osteomyelitis Plan: Vancomycin 1500mg  IV  loading dose, then 1gm IV Every 24 hours Cefepime 2gm IV every 12 hours   Height: 5\' 2"  (157.5 cm) Weight: 162 lb (73.483 kg) IBW/kg (Calculated) : 50.1  Temp (24hrs), Avg:98 F (36.7 C), Min:98 F (36.7 C), Max:98 F (36.7 C)   Recent Labs Lab 07/23/15 1631  WBC 4.5  CREATININE 1.09*    Estimated Creatinine Clearance: 39.3 mL/min (by C-G formula based on Cr of 1.09).    Allergies  Allergen Reactions  . Codeine Other (See Comments)    Blood pressure drops; "I pass out."  . Pramipexole Dihydrochloride Nausea Only    sick, falling  . Rofecoxib Other (See Comments)    "sleep walks"  . Arava [Leflunomide] Swelling  . Clonazepam   . Diclofenac Sodium   . Venlafaxine   . Alprazolam Other (See Comments)    "Makes me mean"  . Darvocet [Propoxyphene N-Acetaminophen] Other (See Comments)    "makes my eyes dilate. Pt stated I can't see"  . Duloxetine Other (See Comments)    achy legs  . Gabapentin Other (See Comments)    headache  . Latex Itching and Dermatitis    When examined with latex gloves, burns and itches in contact areas per pt.  . Morphine And Related Other (See Comments)    Hallucinations   . Nortriptyline Hcl Other (See Comments)    Migraines   . Penicillins Rash    Has patient had a PCN reaction causing immediate rash, facial/tongue/throat swelling, SOB or lightheadedness with hypotension: Yes Has patient had a PCN reaction causing severe rash involving mucus membranes or skin necrosis: Yes Has patient had a PCN reaction that required hospitalization No Has patient had a PCN reaction occurring within the last 10 years: Yes If all of the above answers are "NO", then may proceed with  Cephalosporin use.   Can take Cephalosporins  . Prednisone Swelling    Just doesn't want to take it.    Antimicrobials this admission: Vancomycin 3/21 >> Cefepime 3/21 >>   Microbiology results: Not available  Thank you for allowing pharmacy to be a part of this patient's care. Isac Sarna, BS Pharm D, [place] Clinical Pharmacist Pager [phone] 07/23/2015 7:05 PM

## 2015-07-23 NOTE — H&P (Signed)
[facility] History and Physical  Diane Abbott:[id number] DOB: 11/28/1934 DOA: 07/23/2015  Referring physician:Dr. Tomi Abbott PCP: Diane Kehr, MD   Chief Complaint: Back pain  HPI:  Diane Abbott is 81 year old female with a past medical history significant for rheumatoid arthritis, adrenal insufficiency, gerd; who presents with new onset complaints of back pain. Pain started approximately a week ago in her lower back. Pain is describes as constantly achy with intermittent sharp pains that shoot upwards. Symptoms exacerbated by movement. She reports approximately 10 days ago washing her car and waxing a piano/organ at church. Denies any numbness, tingling, inability to sense the need to avoid, or weakness in the lower extremities. Associated symptoms include some  constipation( last bowel movement 4 days ago), and  reports of urinary frequency(chronic). Denies any recent trauma, illness, chest pain, shortness of breath, headache, vision changes, or focal deficits. States having noticed twinges of pain intermittently at the beginning of the year for which she had notified her rheumatologist about but was advised to follow-up if pain worsened. Upon admission patient was evaluated with x-rays of the back and then subsequently a CT scan which revealed signs of possible discitis around L1-L2 concerning for osteomyelitis with evidence of a right paraspinal muscle mass adjacent questioned to be inflammation. The ED physician discussed the case with neurosurgery who recommended MRI and treatment of the possible infection. Transferring to Diane Abbott for MRI and possible need of consultative services.   Review of Systems  Constitutional: Negative for weight loss and malaise/fatigue.  HENT: Negative for hearing loss and tinnitus.   Eyes: Negative for photophobia and pain.  Respiratory: Negative for hemoptysis, sputum production and shortness of breath.   Cardiovascular: Negative for chest pain and  palpitations.  Gastrointestinal: Positive for constipation. Negative for abdominal pain and diarrhea.  Genitourinary: Positive for frequency. Negative for dysuria.  Musculoskeletal: Positive for back pain.  Skin: Negative for itching and rash.  Neurological: Negative for sensory change and speech change.  Psychiatric/Behavioral: Negative for hallucinations and substance abuse.        Past Medical History  Diagnosis Date  . Fibromyalgia   . Pulmonary embolism ([place])   . RA (rheumatoid arthritis) ([place])   . Vitamin B 12 deficiency   . Vitamin D deficiency   . Osteoarthritis   . Osteopenia   . Adrenal insufficiency ([place])   . IBS (irritable bowel syndrome)   . History of blood clots 2008    below knee  . Diabetes mellitus type II     pt denies this 03-05-11  . Esophagitis   . Depression   . GERD (gastroesophageal reflux disease)   . Connective tissue disorder ([place])   . Cataracts, bilateral     more in left than right  . Osteoarthritis of left shoulder 12/07/2011  . Heart murmur   . Leaky heart valve   . Anginal pain [facility])     "comes and goes has occured since 2012"  . Bronchitis Jan 2013-March 2013    severe  . Shortness of breath     comes from aneurysm  . Vertigo     hx of  . Urinary leakage     when coughing  . Sliding hiatal hernia   . Fatty liver   . History of echocardiogram     Echo 11/16: Mild LVH, EF 60-65%, normal wall motion, grade 1 diastolic dysfunction, bioprosthetic AVR with mean gradient 21 mmHg and no regurgitation, proximal aortic arch aneurysm 49 mm, moderate TR, PASP 31  mmHg     Past Surgical History  Procedure Laterality Date  . Abdominal hysterectomy    . Total knee arthroplasty      bilateral  . Cholecystectomy  2003  . Tonsillectomy  1941  . Wrist surgery      bilateral  . Bilateral oophorectomy    . Tummy tuck    . Cosmetic surgery      Tummy tuck  . Total shoulder arthroplasty  12/07/2011    Procedure: TOTAL SHOULDER ARTHROPLASTY;   Surgeon: Johnny Bridge, MD;  Location: [facility];  Service: Orthopedics;  Laterality: Left;  left total shoulder artthroplasty  . Joint replacement Bilateral   . Cardiac catheterization  09/15/12    no PCI  . Colonoscopy    . Breast biopsy Left   . Skin cancer excision Right     cheek  . Eye surgery Bilateral     Cataract  . [place] procedure N/A 09/29/2012    Procedure: BENTALL PROCEDURE;  Surgeon: Gaye Pollack, MD;  Location: [facility];  Service: Open Heart Surgery;  Laterality: N/A;  WITH CIRC ARREST  . Intraoperative transesophageal echocardiogram N/A 09/29/2012    Procedure: INTRAOPERATIVE TRANSESOPHAGEAL ECHOCARDIOGRAM;  Surgeon: Gaye Pollack, MD;  Location: [facility] OR;  Service: Open Heart Surgery;  Laterality: N/A;  . Aorta surgery  12/2012  . Appendectomy  1956      Social History:  reports that she has never smoked. She has never used smokeless tobacco. She reports that she drinks alcohol. She reports that she does not use illicit drugs. Where does patient live--home Can patient participate in ADLs?yes  Allergies  Allergen Reactions  . Codeine Other (See Comments)    Blood pressure drops; "I pass out."  . Pramipexole Dihydrochloride Nausea Only    sick, falling  . Rofecoxib Other (See Comments)    "sleep walks"  . Arava [Leflunomide] Swelling  . Clonazepam   . Diclofenac Sodium   . Venlafaxine   . Alprazolam Other (See Comments)    "Makes me mean"  . Darvocet [Propoxyphene N-Acetaminophen] Other (See Comments)    "makes my eyes dilate. Pt stated I can't see"  . Duloxetine Other (See Comments)    achy legs  . Gabapentin Other (See Comments)    headache  . Latex Itching and Dermatitis    When examined with latex gloves, burns and itches in contact areas per pt.  . Morphine And Related Other (See Comments)    Hallucinations   . Nortriptyline Hcl Other (See Comments)    Migraines   . Penicillins Rash    Has patient had a PCN reaction causing immediate rash,  facial/tongue/throat swelling, SOB or lightheadedness with hypotension: Yes Has patient had a PCN reaction causing severe rash involving mucus membranes or skin necrosis: Yes Has patient had a PCN reaction that required hospitalization No Has patient had a PCN reaction occurring within the last 10 years: Yes If all of the above answers are "NO", then may proceed with Cephalosporin use.   Can take Cephalosporins  . Prednisone Swelling    Just doesn't want to take it.    Family History  Problem Relation Age of Onset  . Ovarian cancer Mother   . Heart disease Maternal Grandmother   . Clotting disorder    . Breast cancer    . Diabetes Mother   . Pancreatic cancer    . COPD Father   . Colon cancer Neg Hx        Prior  to Admission medications   Medication Sig Start Date End Date Taking? Authorizing Provider  Ascorbic Acid (VITAMIN C) 1000 MG tablet Take 6,000 mg by mouth daily.   Yes Historical Provider, MD  aspirin EC 81 MG EC tablet Take 1 tablet (81 mg total) by mouth daily. Patient taking differently: Take 81-162 mg by mouth 2 (two) times daily. 2 tablets in the morning and 1 tablet at bedtime 10/05/12  Yes Erin R Barrett, PA-C  Cholecalciferol 5000 units TABS Take 1 tablet by mouth daily.   Yes Historical Provider, MD  Collagen 500 MG CAPS Take 1 capsule by mouth daily.   Yes Historical Provider, MD  CYANOCOBALAMIN PO Take 1 tablet by mouth daily.    Yes Historical Provider, MD  diazepam (VALIUM) 2 MG tablet Take 1 tablet (2 mg total) by mouth every 12 (twelve) hours as needed for anxiety. 05/30/14  Yes Aleksei Plotnikov V, MD  diphenhydrAMINE (SOMINEX) 25 MG tablet Take 25 mg by mouth at bedtime as needed for itching. Reported on 06/03/2015   Yes Historical Provider, MD  Estradiol (VAGIFEM) 10 MCG TABS vaginal tablet INSERT ONE TABLET VAGINALLY EVERY 2 (TWO) DAYS 11/29/14  Yes Aleksei Plotnikov V, MD  Haw[place]orn Berries 565 MG CAPS Take by mouth daily.   Yes Historical Provider, MD   ipratropium (ATROVENT) 0.06 % nasal spray Place 1-2 sprays into the nose as needed. For runny nose Patient taking differently: Place 1-2 sprays into the nose as needed for rhinitis. For runny nose 03/27/15  Yes Aleksei Plotnikov V, MD  meclizine (ANTIVERT) 12.5 MG tablet TAKE ONE TO TWO TABLETS BY MOUTH THREE TIMES DAILY AS NEEDED FOR DIZZINESS 07/04/14  Yes Aleksei Plotnikov V, MD  niacin 500 MG tablet Take 500 mg by mouth 2 (two) times daily with a meal.   Yes Historical Provider, MD  oxyCODONE (OXYCONTIN) 10 mg 12 hr tablet Take 1 tablet (10 mg total) by mouth every 12 (twelve) hours. Please fill on or after 06/01/15 06/03/15  Yes Aleksei Plotnikov V, MD  rOPINIRole (REQUIP) 2 MG tablet TAKE ONE TABLET BY MOUTH THREE TIMES DAILY 01/21/15  Yes Aleksei Plotnikov V, MD  vitamin E 400 UNIT capsule Take 800 Units by mouth once a week.   Yes Historical Provider, MD  NEXIUM 40 MG capsule TAKE ONE CAPSULE BY MOUTH TWICE DAILY Patient not taking: Reported on 07/23/2015 02/06/13   Cassandria Anger, MD     Physical Exam: Filed Vitals:   07/23/15 1500 07/23/15 1530 07/23/15 1630 07/23/15 1645  BP: 126/51 128/50 125/54   Pulse: 56   121  Temp:      TempSrc:      Resp:      Height:      Weight:      SpO2: 89%   95%     Constitutional: Vital signs reviewed. Patient is a well-developed and well-nourished in no acute distress and cooperative with exam. Alert and oriented x3.  Head: Normocephalic and atraumatic  Ear: TM normal bilaterally  Mouth: no erythema or exudates, MMM  Eyes: PERRL, EOMI, conjunctivae normal, No scleral icterus.  Neck: Supple, Trachea midline normal ROM, No JVD, mass, thyromegaly, or carotid bruit present.  Cardiovascular: RRR, S1 normal, S2 normal, no MRG, pulses symmetric and intact bilaterally  Pulmonary/Chest: CTAB, no wheezes, rales, or rhonchi  Abdominal: Soft. Non-tender, non-distended, bowel sounds are normal, no masses, organomegaly, or guarding present.  GU: no CVA  tenderness Musculoskeletal: Tenderness to palpation around the lumbar spine, pain with  any movement  Ext: no edema and no cyanosis, pulses palpable bilaterally (DP and PT)  Hematology: no cervical, inginal, or axillary adenopathy.  Neurological: A&O x3, Strenght is normal and symmetric bilaterally, cranial nerve II-XII are grossly intact, no focal motor deficit, sensory intact to light touch bilaterally.  Skin: Warm, dry and intact. No rash, cyanosis, or clubbing.  Psychiatric: Normal mood and affect. speech and behavior is normal. Judgment and thought content normal. Cognition and memory are normal.      Data Review   Micro Results No results found for this or any previous visit (from the past 240 hour(s)).  Radiology Reports Dg Thoracic Spine 2 View  07/23/2015  CLINICAL DATA:  Low back injury EXAM: THORACIC SPINE 2 VIEWS COMPARISON:  03/25/2015 FINDINGS: T3 and L1 compression fractures are stable. Osteopenia. No new fracture. Degenerative changes and slight dextroscoliosis are stable. IMPRESSION: Stable T3 and L1 compression fractures.  No acute bony pathology. Electronically Signed   By: Marybelle Killings M.D.   On: 07/23/2015 17:24   Ct Lumbar Spine Wo Contrast  07/23/2015  CLINICAL DATA:  Cleaned organ at church and clean/waxed daughter car about 7 days ago. Pt has been in bed for last 6-7 days, using depends because she has not been able to walk. EXAM: CT LUMBAR SPINE WITHOUT CONTRAST TECHNIQUE: Multidetector CT imaging of the lumbar spine was performed without intravenous contrast administration. Multiplanar CT image reconstructions were also generated. COMPARISON:  08/24/2012 FINDINGS: Progressive compression deformity L1 vertebral body, with loss of about 75%. Interval progressive sclerosis of L1, with loss of inferior endplate cortex. Also, loss of superior endplate cortex of L2, with sclerosis beneath the superior L2 endplate. There is erosive change on both sides of the L1-2 disk. There  is increased attenuation in the paraspinous soft tissues at the L1-2 level with evidence of a right paraspinous mass. Again seen is retropulsion of L1 posterior cortex, causing mild canal narrowing. L3, L4, and L5 vertebral bodies stable. L1-2: Disc bulge and moderate facet hypertrophy causes moderate canal narrowing. L2-3: There is vacuum phenomenon in the disc. There is significant disc bulge with facet hypertrophy. There is significant canal and foraminal narrowing bilaterally. L3-4: Moderate facet hypertrophy. Mild disc bulge. Mild to moderate canal narrowing. Mild foraminal narrowing. L4-5: Severe facet hypertrophy. Moderate disc bulge. Calcification of the ligamentum flavum. Severe canal narrowing. Moderate foraminal narrowing. L5-S1: Severe facet hypertrophy. Mild to moderate disc bulge. Mild canal narrowing. Mild foraminal narrowing bilaterally. Moderate calcification of the aortoiliac vessels. IMPRESSION: In addition to significant canal and foraminal narrowing throughout the lumbar spine due to disk and facet degenerative changes, there is further progressive compressive deformity of L1, and there is evidence of diskitis involving the L1-2 disk with changes in the adjoining endplates concerning for osteomyelitis. A right L1-2 paraspinous soft tissue mass is likely related to inflammation. MRI of the lumbar spine without and with contrast suggested to evaluate further. Electronically Signed   By: Skipper Cliche M.D.   On: 07/23/2015 17:39     CBC  Recent Labs Lab 07/23/15 1631  WBC 4.5  HGB 11.7*  HCT 34.5*  PLT 131*  MCV 95.3  MCH 32.3  MCHC 33.9  RDW 13.4  LYMPHSABS 1.0  MONOABS 0.4  EOSABS 0.1  BASOSABS 0.0    Chemistries   Recent Labs Lab 07/23/15 1631  NA 139  K 3.7  CL 102  CO2 30  GLUCOSE 99  BUN 18  CREATININE 1.09*  CALCIUM 8.8*   ------------------------------------------------------------------------------------------------------------------  estimated  creatinine clearance is 39.3 mL/min (by C-G formula based on Cr of 1.09). ------------------------------------------------------------------------------------------------------------------ No results for input(s): HGBA1C in the last 72 hours. ------------------------------------------------------------------------------------------------------------------ No results for input(s): CHOL, HDL, LDLCALC, TRIG, CHOLHDL, LDLDIRECT in the last 72 hours. ------------------------------------------------------------------------------------------------------------------ No results for input(s): TSH, T4TOTAL, T3FREE, THYROIDAB in the last 72 hours.  Invalid input(s): FREET3 ------------------------------------------------------------------------------------------------------------------ No results for input(s): VITAMINB12, FOLATE, FERRITIN, TIBC, IRON, RETICCTPCT in the last 72 hours.  Coagulation profile No results for input(s): INR, PROTIME in the last 168 hours.  No results for input(s): DDIMER in the last 72 hours.  Cardiac Enzymes No results for input(s): CKMB, TROPONINI, MYOGLOBIN in the last 168 hours.  Invalid input(s): CK ------------------------------------------------------------------------------------------------------------------ Invalid input(s): POCBNP   CBG: No results for input(s): GLUCAP in the last 168 hours.        Assessment/Plan Back pain secondary to discitis: Acute. Patient reporting a weeklong history of worsening back pain. CT imaging reveals inflammation of L1-L2 question possibility of osteomyelitis. Case was discussed with Dr Kathyrn Sheriff of neurosurgery who recommended MRI and antibiotics for the possibility of underlying infection. - Admit to [facility]urg bed at [facility] - ESR and CRP - MRI of spine - Empiric antibiotics of vancomycin and cefepime - Check blood cultures x 2 - Call neurosurgery in a.m. and make sure there consulted - Oxycodone and Dilaudid prn  moderate and severe pain respectively  Rheumatoid arthritis: Stable  Abdominal aortic aneurysm repair: Patient with history of abdominal aortic aneurysm status post repair including aortic valve replacement; who subsequently recurrent abdominal aortic aneurysm measuring 4.1 cm last checked. - Continue to monitor   Chronic kidney disease stage III: Stable - Continue to monitor  Anemia: Stable. Hemoglobin unchanged from 2/27. - Continue to monitor   Code Status:   full Family Communication: bedside Disposition Plan: admit   Total time spent 55 minutes.Greater than 50% of this time was spent in counseling, explanation of diagnosis, planning of further management, and coordination of care  [facility] Pager [phone]  If 7PM-7AM, please contact night-coverage [email or web address] Password [facility] 07/23/2015, 7:11 PM

## 2015-07-24 ENCOUNTER — Inpatient Hospital Stay (HOSPITAL_COMMUNITY): Payer: Medicare Other

## 2015-07-24 DIAGNOSIS — M4646 Discitis, unspecified, lumbar region: Secondary | ICD-10-CM

## 2015-07-24 DIAGNOSIS — G061 Intraspinal abscess and granuloma: Secondary | ICD-10-CM

## 2015-07-24 DIAGNOSIS — B9689 Other specified bacterial agents as the cause of diseases classified elsewhere: Secondary | ICD-10-CM

## 2015-07-24 DIAGNOSIS — M4626 Osteomyelitis of vertebra, lumbar region: Secondary | ICD-10-CM

## 2015-07-24 DIAGNOSIS — M5489 Other dorsalgia: Secondary | ICD-10-CM

## 2015-07-24 LAB — MRSA PCR SCREENING: MRSA BY PCR: POSITIVE — AB

## 2015-07-24 LAB — C-REACTIVE PROTEIN: CRP: 17.4 mg/dL — ABNORMAL HIGH (ref <1.0)

## 2015-07-24 MED ORDER — SENNOSIDES-DOCUSATE SODIUM 8.6-50 MG PO TABS
1.0000 | ORAL_TABLET | Freq: Every day | ORAL | Status: DC
  Administered 2015-07-24 – 2015-07-28 (×5): 1 via ORAL
  Filled 2015-07-24 (×5): qty 1

## 2015-07-24 MED ORDER — METHOCARBAMOL 500 MG PO TABS
500.0000 mg | ORAL_TABLET | Freq: Four times a day (QID) | ORAL | Status: DC | PRN
  Administered 2015-07-25 – 2015-07-29 (×8): 500 mg via ORAL
  Filled 2015-07-24 (×8): qty 1

## 2015-07-24 MED ORDER — HYDROMORPHONE HCL 1 MG/ML IJ SOLN
0.5000 mg | INTRAMUSCULAR | Status: DC | PRN
  Administered 2015-07-25: 0.5 mg via INTRAVENOUS
  Filled 2015-07-24: qty 1

## 2015-07-24 MED ORDER — GADOBENATE DIMEGLUMINE 529 MG/ML IV SOLN
15.0000 mL | Freq: Once | INTRAVENOUS | Status: AC | PRN
  Administered 2015-07-24: 15 mL via INTRAVENOUS

## 2015-07-24 MED ORDER — HYDROMORPHONE HCL 1 MG/ML IJ SOLN
2.0000 mg | INTRAMUSCULAR | Status: DC | PRN
Start: 2015-07-24 — End: 2015-07-24
  Filled 2015-07-24: qty 2

## 2015-07-24 NOTE — Care Management Note (Signed)
Case Management Note  Patient Details  Name: Diane Abbott MRN: [id number] Date of Birth: 11-07-1934  Subjective/Objective:                    Action/Plan: Patient was admitted for back pain, concern for osteomyelitis/diskitis.  Lives at home alone. Will follow for discharge needs.  Expected Discharge Date:                  Expected Discharge Plan:     In-House Referral:     Discharge planning Services     Post Acute Care Choice:    Choice offered to:     DME Arranged:    DME Agency:     HH Arranged:    HH Agency:     Status of Service:  In process, will continue to follow  Medicare Important Message Given:    Date Medicare IM Given:    Medicare IM give by:    Date Additional Medicare IM Given:    Additional Medicare Important Message give by:     If discussed at [place] of Stay Meetings, dates discussed:    Additional Comments:  Rolm Baptise, RN 07/24/2015, 7:52 AM [phone]

## 2015-07-24 NOTE — Consult Note (Signed)
Chief Complaint: Patient was seen in consultation today for image guided aspiration of L1-2 disc/paraspinous space Chief Complaint  Patient presents with  . Back Pain   Referring Physician(s): Dhungel,Nishant/Comer,Robert/Nundkumar, Nena Polio  Supervising Physician: Luanne Bras  History of Present Illness: Diane Abbott is a 81 y.o. female with PMH as outlined below who was recently admitted with persistent mid to low back pain with radiation down both lower extremities. There was no history of trauma/fall, fevers/chills, prior back surgery or bowel/bladder dysfunction. She has been unable to ambulate because of severe back pain. Recent imaging has revealed findings c/w osteomyelitis/discitis at L1-2 with associated epidural and paraspinous phelgmon and possible small adjacent abscesses. She has been evaluated by ID/NS and request now received for image guided aspiration of the L1-2 disc space/paraspinous region.   Past Medical History  Diagnosis Date  . Fibromyalgia   . Pulmonary embolism ([place])   . RA (rheumatoid arthritis) ([place])   . Vitamin B 12 deficiency   . Vitamin D deficiency   . Osteoarthritis   . Osteopenia   . Adrenal insufficiency ([place])   . IBS (irritable bowel syndrome)   . History of blood clots 2008    below knee  . Diabetes mellitus type II     pt denies this 03-05-11  . Esophagitis   . Depression   . GERD (gastroesophageal reflux disease)   . Connective tissue disorder ([place])   . Cataracts, bilateral     more in left than right  . Osteoarthritis of left shoulder 12/07/2011  . Heart murmur   . Leaky heart valve   . Anginal pain [facility])     "comes and goes has occured since 2012"  . Bronchitis Jan 2013-March 2013    severe  . Shortness of breath     comes from aneurysm  . Vertigo     hx of  . Urinary leakage     when coughing  . Sliding hiatal hernia   . Fatty liver   . History of echocardiogram     Echo 11/16: Mild LVH, EF 60-65%, normal wall  motion, grade 1 diastolic dysfunction, bioprosthetic AVR with mean gradient 21 mmHg and no regurgitation, proximal aortic arch aneurysm 49 mm, moderate TR, PASP 31 mmHg    Past Surgical History  Procedure Laterality Date  . Abdominal hysterectomy    . Total knee arthroplasty      bilateral  . Cholecystectomy  2003  . Tonsillectomy  1941  . Wrist surgery      bilateral  . Bilateral oophorectomy    . Tummy tuck    . Cosmetic surgery      Tummy tuck  . Total shoulder arthroplasty  12/07/2011    Procedure: TOTAL SHOULDER ARTHROPLASTY;  Surgeon: Johnny Bridge, MD;  Location: [facility];  Service: Orthopedics;  Laterality: Left;  left total shoulder artthroplasty  . Joint replacement Bilateral   . Cardiac catheterization  09/15/12    no PCI  . Colonoscopy    . Breast biopsy Left   . Skin cancer excision Right     cheek  . Eye surgery Bilateral     Cataract  . [place] procedure N/A 09/29/2012    Procedure: BENTALL PROCEDURE;  Surgeon: Gaye Pollack, MD;  Location: [place];  Service: Open Heart Surgery;  Laterality: N/A;  WITH CIRC ARREST  . Intraoperative transesophageal echocardiogram N/A 09/29/2012    Procedure: INTRAOPERATIVE TRANSESOPHAGEAL ECHOCARDIOGRAM;  Surgeon: Gaye Pollack, MD;  Location: [facility];  Service: Open Heart Surgery;  Laterality: N/A;  . Aorta surgery  12/2012  . Appendectomy  1956    Allergies: Codeine; Pramipexole dihydrochloride; Rofecoxib; Arava; Clonazepam; Diclofenac sodium; Venlafaxine; Alprazolam; Darvocet; Duloxetine; Gabapentin; Latex; Morphine and related; Nortriptyline hcl; Penicillins; and Prednisone  Medications: Prior to Admission medications   Medication Sig Start Date End Date Taking? Authorizing Provider  Ascorbic Acid (VITAMIN C) 1000 MG tablet Take 6,000 mg by mouth daily.   Yes Historical Provider, MD  aspirin EC 81 MG EC tablet Take 1 tablet (81 mg total) by mouth daily. Patient taking differently: Take 81-162 mg by mouth 2 (two) times daily. 2  tablets in the morning and 1 tablet at bedtime 10/05/12  Yes Erin R Barrett, PA-C  Cholecalciferol 5000 units TABS Take 1 tablet by mouth daily.   Yes Historical Provider, MD  Collagen 500 MG CAPS Take 1 capsule by mouth daily.   Yes Historical Provider, MD  CYANOCOBALAMIN PO Take 1 tablet by mouth daily.    Yes Historical Provider, MD  diazepam (VALIUM) 2 MG tablet Take 1 tablet (2 mg total) by mouth every 12 (twelve) hours as needed for anxiety. 05/30/14  Yes Aleksei Plotnikov V, MD  diphenhydrAMINE (SOMINEX) 25 MG tablet Take 25 mg by mouth at bedtime as needed for itching. Reported on 06/03/2015   Yes Historical Provider, MD  Estradiol (VAGIFEM) 10 MCG TABS vaginal tablet INSERT ONE TABLET VAGINALLY EVERY 2 (TWO) DAYS 11/29/14  Yes Aleksei Plotnikov V, MD  Haw[place]orn Berries 565 MG CAPS Take by mouth daily.   Yes Historical Provider, MD  ipratropium (ATROVENT) 0.06 % nasal spray Place 1-2 sprays into the nose as needed. For runny nose Patient taking differently: Place 1-2 sprays into the nose as needed for rhinitis. For runny nose 03/27/15  Yes Aleksei Plotnikov V, MD  meclizine (ANTIVERT) 12.5 MG tablet TAKE ONE TO TWO TABLETS BY MOUTH THREE TIMES DAILY AS NEEDED FOR DIZZINESS 07/04/14  Yes Aleksei Plotnikov V, MD  niacin 500 MG tablet Take 500 mg by mouth 2 (two) times daily with a meal.   Yes Historical Provider, MD  oxyCODONE (OXYCONTIN) 10 mg 12 hr tablet Take 1 tablet (10 mg total) by mouth every 12 (twelve) hours. Please fill on or after 06/01/15 06/03/15  Yes Aleksei Plotnikov V, MD  rOPINIRole (REQUIP) 2 MG tablet TAKE ONE TABLET BY MOUTH THREE TIMES DAILY 01/21/15  Yes Aleksei Plotnikov V, MD  vitamin E 400 UNIT capsule Take 800 Units by mouth once a week.   Yes Historical Provider, MD  NEXIUM 40 MG capsule TAKE ONE CAPSULE BY MOUTH TWICE DAILY Patient not taking: Reported on 07/23/2015 02/06/13   Cassandria Anger, MD     Family History  Problem Relation Age of Onset  . Ovarian cancer Mother    . Heart disease Maternal Grandmother   . Clotting disorder    . Breast cancer    . Diabetes Mother   . Pancreatic cancer    . COPD Father   . Colon cancer Neg Hx     Social History   Social History  . Marital Status: Divorced    Spouse Name: N/A  . Number of Children: N/A  . Years of Education: N/A   Occupational History  . retired    Social History Main Topics  . Smoking status: Never Smoker   . Smokeless tobacco: Never Used  . Alcohol Use: Yes     Comment: occassional  . Drug Use: No  . Sexual  Activity: Not Asked   Other Topics Concern  . None   Social History Narrative      Review of Systems  Constitutional: Negative for fever and chills.  Respiratory: Positive for shortness of breath. Negative for cough.   Cardiovascular: Negative for chest pain.  Gastrointestinal: Negative for nausea, vomiting, abdominal pain and blood in stool.  Genitourinary: Positive for frequency. Negative for dysuria and hematuria.  Musculoskeletal: Positive for back pain.  Neurological: Negative for headaches.  Psychiatric/Behavioral: The patient is nervous/anxious.     Vital Signs: BP 126/68 mmHg  Pulse 61  Temp(Src) 99 F (37.2 C) (Oral)  Resp 16  Ht 5\' 2"  (1.575 m)  Wt 158 lb 15.2 oz (72.1 kg)  BMI 29.07 kg/m2  SpO2 94%  Physical Exam  Constitutional: She is oriented to person, place, and time. She appears well-developed and well-nourished.  Cardiovascular: Normal rate and regular rhythm.   Murmur heard. Pulmonary/Chest: Effort normal.  Sl dim BS bases  Abdominal: Soft. Bowel sounds are normal. There is no tenderness.  Musculoskeletal: She exhibits no edema.  Tenderness mid to lower back region with rad to both flanks  Neurological: She is alert and oriented to person, place, and time.    Mallampati Score:     Imaging: Dg Thoracic Spine 2 View  07/23/2015  CLINICAL DATA:  Low back injury EXAM: THORACIC SPINE 2 VIEWS COMPARISON:  03/25/2015 FINDINGS: T3 and L1  compression fractures are stable. Osteopenia. No new fracture. Degenerative changes and slight dextroscoliosis are stable. IMPRESSION: Stable T3 and L1 compression fractures.  No acute bony pathology. Electronically Signed   By: Marybelle Killings M.D.   On: 07/23/2015 17:24   Ct Lumbar Spine Wo Contrast  07/23/2015  CLINICAL DATA:  Cleaned organ at church and clean/waxed daughter car about 7 days ago. Pt has been in bed for last 6-7 days, using depends because she has not been able to walk. EXAM: CT LUMBAR SPINE WITHOUT CONTRAST TECHNIQUE: Multidetector CT imaging of the lumbar spine was performed without intravenous contrast administration. Multiplanar CT image reconstructions were also generated. COMPARISON:  08/24/2012 FINDINGS: Progressive compression deformity L1 vertebral body, with loss of about 75%. Interval progressive sclerosis of L1, with loss of inferior endplate cortex. Also, loss of superior endplate cortex of L2, with sclerosis beneath the superior L2 endplate. There is erosive change on both sides of the L1-2 disk. There is increased attenuation in the paraspinous soft tissues at the L1-2 level with evidence of a right paraspinous mass. Again seen is retropulsion of L1 posterior cortex, causing mild canal narrowing. L3, L4, and L5 vertebral bodies stable. L1-2: Disc bulge and moderate facet hypertrophy causes moderate canal narrowing. L2-3: There is vacuum phenomenon in the disc. There is significant disc bulge with facet hypertrophy. There is significant canal and foraminal narrowing bilaterally. L3-4: Moderate facet hypertrophy. Mild disc bulge. Mild to moderate canal narrowing. Mild foraminal narrowing. L4-5: Severe facet hypertrophy. Moderate disc bulge. Calcification of the ligamentum flavum. Severe canal narrowing. Moderate foraminal narrowing. L5-S1: Severe facet hypertrophy. Mild to moderate disc bulge. Mild canal narrowing. Mild foraminal narrowing bilaterally. Moderate calcification of the  aortoiliac vessels. IMPRESSION: In addition to significant canal and foraminal narrowing throughout the lumbar spine due to disk and facet degenerative changes, there is further progressive compressive deformity of L1, and there is evidence of diskitis involving the L1-2 disk with changes in the adjoining endplates concerning for osteomyelitis. A right L1-2 paraspinous soft tissue mass is likely related to inflammation. MRI of  the lumbar spine without and with contrast suggested to evaluate further. Electronically Signed   By: Skipper Cliche M.D.   On: 07/23/2015 17:39   Mr Lumbar Spine W Wo Contrast  07/24/2015  CLINICAL DATA:  Initial evaluation for possible osteomyelitis discitis. EXAM: MRI LUMBAR SPINE WITHOUT AND WITH CONTRAST TECHNIQUE: Multiplanar and multiecho pulse sequences of the lumbar spine were obtained without and with intravenous contrast. CONTRAST:  67mL MULTIHANCE GADOBENATE DIMEGLUMINE 529 MG/ML IV SOLN COMPARISON:  Prior CT from 07/23/2015. FINDINGS: Mild levoscoliosis with apex at L1-2 present. 3 mm anterolisthesis of L4 on L5. Vertebral bodies otherwise normally aligned with preservation of the normal lumbar lordosis. Chronic anterior height loss at the L1 vertebral body of approximately 50%. No significant bony retropulsion. Vertebral body heights otherwise maintained. Benign hemangioma noted within the T12 vertebral body. Signal intensity within the vertebral body bone marrow fairly patchy and heterogeneous. There is abnormal T1 hypo intense, T2/STIR hyperintense signal intensity within the L1 and L2 vertebral bodies, centered about the L1-2 disc space. Associated postcontrast enhancement. There is abnormal edema and fluid signal intensity within the L1-2 disc itself. Findings consistent with osteomyelitis discitis. Associated phlegmonous change within the adjacent psoas muscle on the right. Possible tiny 3 mm psoas abscess noted (series 10, image 10). Edema within the adjacent L2-3 disc  also concerning for possible discitis. There is a 14 mm T2 hyperintense, T1 hypo intense, enhancing lesion at the right posterior margin of the L1-2 interspace (Series 4, image 7). Are unclear whether this reflects a small abscess or possibly disc material. Abnormal enhancement within the ventral epidural space with probable small amount of epidural abscess posterior to the L2 vertebral body (series 10, image 12). Associated epidural enhancement within the posterior epidural space at the level of L1 it and L2 as well. Inflammatory signal changes are relatively confined to the L1 and L2 levels at this point, with no other significant inflammatory changes within the visualized spine. Conus medullaris terminates normally at the L1-2 level. Signal intensity within the visualized cord is normal. Nerve roots of the cauda equina within normal limits. Other than the changes related to the osteomyelitis discitis, paraspinous soft tissues within normal limits. T11-12: Seen only on sagittal projection. Disc desiccation without disc bulge. No stenosis. T12-L1: Degenerative disc desiccation with minimal disc bulge. Bilateral facet arthrosis. Mild canal and bilateral foraminal narrowing. L1-2: Changes related to osteomyelitis discitis as above. Moderate canal stenosis. Superimposed moderate to advanced bilateral facet arthropathy present. Mild to moderate bilateral foraminal narrowing, worse on the right. L2-3: Diffuse degenerative disc bulge with disc desiccation. Possible changes related early discitis at this level noted. Mild epidural enhancement without significant epidural collection. Prominent bilateral facet arthrosis with ligamentum flavum thickening. Moderate to severe canal stenosis. Mild bilateral foraminal narrowing. L3-4: No significant disc bulge or disc protrusion. Bilateral facet arthrosis. No significant canal or foraminal narrowing. L4-5: 3 mm anterolisthesis of L4 on L5. Associated disc bulge with severe  bilateral facet arthrosis. Resultant severe canal stenosis with the thecal sac measuring 6 mm in AP diameter. Asymmetric fluid within the left L4-5 facet. Mild bilateral foraminal narrowing. L5-S1: Degenerative disc desiccation with intervertebral disc space narrowing and mild disc bulge. Bilateral facet arthrosis. No significant canal or foraminal stenosis. IMPRESSION: 1. Findings consistent with osteomyelitis discitis centered about the L1-2 intervertebral disc space, with associated epidural and paraspinous phlegmon as above. Possible small epidural abscess posterior to the L2 vertebral body as above. Probable small 3 mm abscess within the right psoas  muscle. Resultant moderate canal stenosis at the L1-2 level. 2. Abnormal edema within the L2-3 disc as well, likely reflecting developing discitis. 3. 3 mm anterolisthesis of L4 on L5 with associated disc bulge and facet disease, resulting in severe canal stenosis. 4. Additional moderate multilevel degenerative spondylolysis and facet arthrosis as above. Please see the above report for a full description these findings. Electronically Signed   By: Jeannine Boga M.D.   On: 07/24/2015 05:25    Labs:  CBC:  Recent Labs  01/01/15 1140 03/12/15 1415 07/01/15 1330 07/23/15 1631  WBC 3.4* 4.2 3.6* 4.5  HGB 12.9 13.0 11.7* 11.7*  HCT 37.9 39.7 35.7* 34.5*  PLT 116* 130* 109* 131*    COAGS: No results for input(s): INR, APTT in the last 8760 hours.  BMP:  Recent Labs  01/01/15 1140 03/12/15 1415 03/18/15 1218 03/26/15 1015 07/01/15 1330 07/23/15 1631  NA 143 139  --  137 139 139  K 3.5 3.9  --  3.7 3.7 3.7  CL 107 103  --  103 105 102  CO2 26 27  --  25 27 30   GLUCOSE 109* 133*  --  87 113* 99  BUN 20 25* 31* 20 10 18   CALCIUM 9.2 9.1  --  8.9 9.1 8.8*  CREATININE 1.09* 1.17* 1.26* 1.10* 1.15* 1.09*  GFRNONAA 47* 43*  --   --  44* 47*  GFRAA 55* 50*  --   --  51* 54*    LIVER FUNCTION TESTS:  Recent Labs  09/10/14 0935  01/01/15 1140 03/12/15 1415 07/01/15 1330  BILITOT 0.7 0.5 0.9 0.5  AST 22 26 30 25   ALT 17 13* 17 14  ALKPHOS 55 65 70 62  PROT 6.6 7.1 7.2 7.0  ALBUMIN 3.7 3.8 4.0 3.7    TUMOR MARKERS: No results for input(s): AFPTM, CEA, CA199, CHROMGRNA in the last 8760 hours.  Assessment and Plan: 81 y.o. female with PMH as outlined below who was recently admitted with persistent mid to low back pain with radiation down both lower extremities. There was no history of trauma/fall, fevers/chills, prior back surgery or bowel/bladder dysfunction. She has been unable to ambulate because of severe back pain. Recent imaging has revealed findings c/w osteomyelitis/discitis at L1-2 with associated epidural and paraspinous phelgmon and possible small adjacent abscesses. She has been evaluated by ID/NS and request now received for image guided aspiration of the L1-2 disc space/paraspinous region. Imaging studies have been reviewed by Dr. Estanislado Pandy and plan is for fluoroscopic guided aspiration of the L1-2 disc space/paraspinous region on 3/23. Risks and benefits discussed with the patient including, but not limited to bleeding, infection, damage to adjacent structures or low yield requiring additional tests.All of the patient's questions were answered, patient is agreeable to proceed.Consent signed and in chart.     Thank you for this interesting consult.  I greatly enjoyed meeting NYLAH WASH and look forward to participating in their care.  A copy of this report was sent to the requesting provider on this date.  Electronically Signed: D. Rowe Robert 07/24/2015, 2:32 PM   I spent a total of 20 minutes in face to face in clinical consultation, greater than 50% of which was counseling/coordinating care for image guided aspiration of L1-2 disc space

## 2015-07-24 NOTE — Progress Notes (Signed)
Pt resting in bed with eyes closed, snoring.

## 2015-07-24 NOTE — Consult Note (Signed)
CC:  Chief Complaint  Patient presents with  . Back Pain    HPI: Diane Abbott is a 81 y.o. female presenting to [facility] yesterday, transferred to [facility] for back pain. Pain initially started in Jan, but has been getting worse. She says it has gotten especially severe after washing her daughter's car last week on Mon. Since then she has been unable to walk because of severe back pain. She does not report any weakness, or changes in bladder function. No N/T of the legs. She reports two different types of pain: sharp, stabbing pain as well as aching type pain more in her legs. Movement exacerbates the pain. She denies hx of diabetes, IVDU, or renal disease.  PMH: Past Medical History  Diagnosis Date  . Fibromyalgia   . Pulmonary embolism ([place])   . RA (rheumatoid arthritis) ([place])   . Vitamin B 12 deficiency   . Vitamin D deficiency   . Osteoarthritis   . Osteopenia   . Adrenal insufficiency ([place])   . IBS (irritable bowel syndrome)   . History of blood clots 2008    below knee  . Diabetes mellitus type II     pt denies this 03-05-11  . Esophagitis   . Depression   . GERD (gastroesophageal reflux disease)   . Connective tissue disorder ([place])   . Cataracts, bilateral     more in left than right  . Osteoarthritis of left shoulder 12/07/2011  . Heart murmur   . Leaky heart valve   . Anginal pain [facility])     "comes and goes has occured since 2012"  . Bronchitis Jan 2013-March 2013    severe  . Shortness of breath     comes from aneurysm  . Vertigo     hx of  . Urinary leakage     when coughing  . Sliding hiatal hernia   . Fatty liver   . History of echocardiogram     Echo 11/16: Mild LVH, EF 60-65%, normal wall motion, grade 1 diastolic dysfunction, bioprosthetic AVR with mean gradient 21 mmHg and no regurgitation, proximal aortic arch aneurysm 49 mm, moderate TR, PASP 31 mmHg    PSH: Past Surgical History  Procedure Laterality Date  . Abdominal hysterectomy    . Total knee  arthroplasty      bilateral  . Cholecystectomy  2003  . Tonsillectomy  1941  . Wrist surgery      bilateral  . Bilateral oophorectomy    . Tummy tuck    . Cosmetic surgery      Tummy tuck  . Total shoulder arthroplasty  12/07/2011    Procedure: TOTAL SHOULDER ARTHROPLASTY;  Surgeon: Johnny Bridge, MD;  Location: [place];  Service: Orthopedics;  Laterality: Left;  left total shoulder artthroplasty  . Joint replacement Bilateral   . Cardiac catheterization  09/15/12    no PCI  . Colonoscopy    . Breast biopsy Left   . Skin cancer excision Right     cheek  . Eye surgery Bilateral     Cataract  . [place] procedure N/A 09/29/2012    Procedure: BENTALL PROCEDURE;  Surgeon: Gaye Pollack, MD;  Location: [place];  Service: Open Heart Surgery;  Laterality: N/A;  WITH CIRC ARREST  . Intraoperative transesophageal echocardiogram N/A 09/29/2012    Procedure: INTRAOPERATIVE TRANSESOPHAGEAL ECHOCARDIOGRAM;  Surgeon: Gaye Pollack, MD;  Location: [facility] OR;  Service: Open Heart Surgery;  Laterality: N/A;  . Aorta surgery  12/2012  .  Appendectomy  1956    SH: Social History  Substance Use Topics  . Smoking status: Never Smoker   . Smokeless tobacco: Never Used  . Alcohol Use: Yes     Comment: occassional    MEDS: Prior to Admission medications   Medication Sig Start Date End Date Taking? Authorizing Provider  Ascorbic Acid (VITAMIN C) 1000 MG tablet Take 6,000 mg by mouth daily.   Yes Historical Provider, MD  aspirin EC 81 MG EC tablet Take 1 tablet (81 mg total) by mouth daily. Patient taking differently: Take 81-162 mg by mouth 2 (two) times daily. 2 tablets in the morning and 1 tablet at bedtime 10/05/12  Yes Erin R Barrett, PA-C  Cholecalciferol 5000 units TABS Take 1 tablet by mouth daily.   Yes Historical Provider, MD  Collagen 500 MG CAPS Take 1 capsule by mouth daily.   Yes Historical Provider, MD  CYANOCOBALAMIN PO Take 1 tablet by mouth daily.    Yes Historical Provider, MD  diazepam  (VALIUM) 2 MG tablet Take 1 tablet (2 mg total) by mouth every 12 (twelve) hours as needed for anxiety. 05/30/14  Yes Aleksei Plotnikov V, MD  diphenhydrAMINE (SOMINEX) 25 MG tablet Take 25 mg by mouth at bedtime as needed for itching. Reported on 06/03/2015   Yes Historical Provider, MD  Estradiol (VAGIFEM) 10 MCG TABS vaginal tablet INSERT ONE TABLET VAGINALLY EVERY 2 (TWO) DAYS 11/29/14  Yes Aleksei Plotnikov V, MD  [place] 565 MG CAPS Take by mouth daily.   Yes Historical Provider, MD  ipratropium (ATROVENT) 0.06 % nasal spray Place 1-2 sprays into the nose as needed. For runny nose Patient taking differently: Place 1-2 sprays into the nose as needed for rhinitis. For runny nose 03/27/15  Yes Aleksei Plotnikov V, MD  meclizine (ANTIVERT) 12.5 MG tablet TAKE ONE TO TWO TABLETS BY MOUTH THREE TIMES DAILY AS NEEDED FOR DIZZINESS 07/04/14  Yes Aleksei Plotnikov V, MD  niacin 500 MG tablet Take 500 mg by mouth 2 (two) times daily with a meal.   Yes Historical Provider, MD  oxyCODONE (OXYCONTIN) 10 mg 12 hr tablet Take 1 tablet (10 mg total) by mouth every 12 (twelve) hours. Please fill on or after 06/01/15 06/03/15  Yes Aleksei Plotnikov V, MD  rOPINIRole (REQUIP) 2 MG tablet TAKE ONE TABLET BY MOUTH THREE TIMES DAILY 01/21/15  Yes Aleksei Plotnikov V, MD  vitamin E 400 UNIT capsule Take 800 Units by mouth once a week.   Yes Historical Provider, MD  NEXIUM 40 MG capsule TAKE ONE CAPSULE BY MOUTH TWICE DAILY Patient not taking: Reported on 07/23/2015 02/06/13   Lew Dawes V, MD    ALLERGY: Allergies  Allergen Reactions  . Codeine Other (See Comments)    Blood pressure drops; "I pass out."  . Pramipexole Dihydrochloride Nausea Only    sick, falling  . Rofecoxib Other (See Comments)    "sleep walks"  . Arava [Leflunomide] Swelling  . Clonazepam   . Diclofenac Sodium   . Venlafaxine   . Alprazolam Other (See Comments)    "Makes me mean"  . Darvocet [Propoxyphene N-Acetaminophen] Other  (See Comments)    "makes my eyes dilate. Pt stated I can't see"  . Duloxetine Other (See Comments)    achy legs  . Gabapentin Other (See Comments)    headache  . Latex Itching and Dermatitis    When examined with latex gloves, burns and itches in contact areas per pt.  . Morphine And Related  Other (See Comments)    Hallucinations   . Nortriptyline Hcl Other (See Comments)    Migraines   . Penicillins Rash    Has patient had a PCN reaction causing immediate rash, facial/tongue/throat swelling, SOB or lightheadedness with hypotension: Yes Has patient had a PCN reaction causing severe rash involving mucus membranes or skin necrosis: Yes Has patient had a PCN reaction that required hospitalization No Has patient had a PCN reaction occurring within the last 10 years: Yes If all of the above answers are "NO", then may proceed with Cephalosporin use.   Can take Cephalosporins  . Prednisone Swelling    Just doesn't want to take it.    ROS: ROS  NEUROLOGIC EXAM: Awake, alert, oriented Memory and concentration grossly intact Speech fluent, appropriate CN grossly intact Motor exam: Upper Extremities Deltoid Bicep Tricep Grip  Right 5/5 5/5 5/5 5/5  Left 5/5 5/5 5/5 5/5   Lower Extremity IP Quad PF DF EHL  Right 5/5 5/5 5/5 5/5 5/5  Left 5/5 5/5 5/5 5/5 5/5   Sensation grossly intact to LT  [facility]: CT demonstrates endplate erosion and right-sided paraspinal mass at L1-2. MRI also demonstrates T2 hyperintensity within the L1-2 space and within the L1 and L2 bodies. There is minimal SEA behind the superior margin of the L2 body, without any significant compression.  IMPRESSION: - 81 y.o. female with L1-2 osteomyelitis/discitis, no significant component of epidural abscess which would warrant surgical intervention  PLAN: - Would recommend IR biopsy of L1-2 space for isolation of pathogenic organism - Consider ID consult - Can f/u in my office in 4 weeks with repeat MRI  L-spine.

## 2015-07-24 NOTE — Progress Notes (Addendum)
[facility] PROGRESS NOTE  Diane Abbott [id number] DOB: [birth year] DOA: 07/23/2015 PCP: Walker Kehr, MD Brief narrative 81 year old with history of rheumatoid arthritis insufficiency, GERD presented with one-week history of progressive pain in her lower back, sharp shooting down to her legs. Denies any trauma, tingling or numbness, weakness of her extremities or bowel or urinary incontinence. (Has chronic urinary frequency which is unchanged and has been constipated for past 4 days). Denies any recent illness, fevers, chills, headache, blurred vision. On presentation a CT scan of the lumbar spine was done which showed possible discitis around L1-L2 concerning for a semi-lipase with right paraspinal muscle mass. An MRI of the lumbar spine was done which showed osteomyelitis discitis over L1-L2 intervertebral disc space with associated epidural and paraspinous phlegmon. Possible small epidural abscess posterior to the L2 vertebral body also seen and a probable small 3 mg abscess within the right psoas muscle. There is moderate Stenosis at the level of L1-L2. Also seen abnormal edema within the L2-L3 disc likely reflecting a developing discitis. Has severe canal stenosis over L4-L5 and multilevel degenerative spondylosis and facet arthrosis. Patient given a dose of IV vancomycin and cefepime. Admitted to hospitalist service.   Assessment/Plan: Acute lumbar discitis/osteomyelitis with possible epidural and psoas abscess Will discontinue further antibiotics in her to get better yield with biopsy.Seen by neurosurgery who recommended IR biopsy of the L1-L2 space. Appreciate ID evaluation. They also recommend holding off on antibiotics pending biopsy. Will send fluid for cultures including AFB and fungal cultures. Will need a PICC line for long-term antibiotics. Check 2-D echo given systolic murmur and history of bioprosthetic valve. Follow blood culture. Pain control as below. PT/OT  evaluation. IR consulted. Was notified that the procedure is scheduled for tomorrow.  Severe low back pain Patient was consistently asking for pain medications this morning. Change Dilaudid to 2 mg every 4 hours as needed. Continue OxyContin every 12 hours. Change Robaxin to every 6 hours as needed. Patient is fast asleep this afternoon and likely does not need further escalation of pain medications.  Rheumatoid arthritis Stable.  History of abdominal aortic aneurysm repair and aortic valve replacement. As per her last imaging of the abdominal aortic aneurysm measured 4.1 cm. Check repeat 2-D echo.   CKD stage 3 Stable.  Chronic anemia Stable.  Constipation Will add stool softeners.   DVT prophylaxis: Subcutaneous Lovenox  Diet: Regular   Code Status: Full code  Family Communication:None at bedside  Disposition Plan: Pending further workup  Consultants: Infectious disease Neurosurgery IR consulted  Procedures: CT lumbar spine MRI lumbar spine    Antibiotics: Vancomycin and cefepime 1 dose   HPI/Subjective: Seen and examined. Complains of ongoing low back pain   Objective: Filed Vitals:   07/24/15 0533 07/24/15 0950  BP: 129/66 126/68  Pulse: 63 61  Temp: 98.9 F (37.2 C) 99 F (37.2 C)  Resp: 18 16    Intake/Output Summary (Last 24 hours) at 07/24/15 1304 Last data filed at 07/24/15 0630  Gross per 24 hour  Intake 751.25 ml  Output    350 ml  Net 401.25 ml   Filed Weights   07/23/15 1448 07/23/15 2201  Weight: 73.483 kg (162 lb) 72.1 kg (158 lb 15.2 oz)    Exam:   General:  Elderly female lying in bed in some distress with pain  HEENT: No pallor, moist mucosa neck slight chest: Clear bilaterally   CVS: Normal S1 and S2, systolic murmur 3/6, no  rub or gallop  GI: Soft, nondistended, nontender, bowel sounds present   Musculoskeletal: Warm, tenderness or lumbar spine area, has tenderness on leg elevation above 45  CNS: Alert and  oriented  Data Reviewed: Basic Metabolic Panel:  Recent Labs Lab 07/23/15 1631  NA 139  K 3.7  CL 102  CO2 30  GLUCOSE 99  BUN 18  CREATININE 1.09*  CALCIUM 8.8*   Liver Function Tests: No results for input(s): AST, ALT, ALKPHOS, BILITOT, PROT, ALBUMIN in the last 168 hours. No results for input(s): LIPASE, AMYLASE in the last 168 hours. No results for input(s): AMMONIA in the last 168 hours. CBC:  Recent Labs Lab 07/23/15 1631  WBC 4.5  NEUTROABS 2.9  HGB 11.7*  HCT 34.5*  MCV 95.3  PLT 131*   Cardiac Enzymes: No results for input(s): CKTOTAL, CKMB, CKMBINDEX, TROPONINI in the last 168 hours. BNP (last 3 results) No results for input(s): BNP in the last 8760 hours.  ProBNP (last 3 results) No results for input(s): PROBNP in the last 8760 hours.  CBG: No results for input(s): GLUCAP in the last 168 hours.  Recent Results (from the past 240 hour(s))  Culture, blood (routine x 2)     Status: None (Preliminary result)   Collection Time: 07/23/15  8:37 PM  Result Value Ref Range Status   Specimen Description RIGHT ANTECUBITAL  Final   Special Requests BOTTLES DRAWN AEROBIC AND ANAEROBIC 6CC  Final   Culture NO GROWTH < 24 HOURS  Final   Report Status PENDING  Incomplete  Culture, blood (routine x 2)     Status: None (Preliminary result)   Collection Time: 07/23/15  8:50 PM  Result Value Ref Range Status   Specimen Description LEFT ANTECUBITAL  Final   Special Requests BOTTLES DRAWN AEROBIC AND ANAEROBIC 6CC  Final   Culture NO GROWTH < 24 HOURS  Final   Report Status PENDING  Incomplete  MRSA PCR Screening     Status: Abnormal   Collection Time: 07/24/15  1:20 AM  Result Value Ref Range Status   MRSA by PCR POSITIVE (A) NEGATIVE Final    Comment:        The GeneXpert MRSA Assay (FDA approved for NASAL specimens only), is one component of a comprehensive MRSA colonization surveillance program. It is not intended to diagnose MRSA infection nor to guide  or monitor treatment for MRSA infections. RESULT CALLED TO, READ BACK BY AND VERIFIED WITH: M ENNIS,RN@[id number]  07/24/15 MKELLY      Studies: Dg Thoracic Spine 2 View  07/23/2015  CLINICAL DATA:  Low back injury EXAM: THORACIC SPINE 2 VIEWS COMPARISON:  03/25/2015 FINDINGS: T3 and L1 compression fractures are stable. Osteopenia. No new fracture. Degenerative changes and slight dextroscoliosis are stable. IMPRESSION: Stable T3 and L1 compression fractures.  No acute bony pathology. Electronically Signed   By: Marybelle Killings M.D.   On: 07/23/2015 17:24   Ct Lumbar Spine Wo Contrast  07/23/2015  CLINICAL DATA:  Cleaned organ at church and clean/waxed daughter car about 7 days ago. Pt has been in bed for last 6-7 days, using depends because she has not been able to walk. EXAM: CT LUMBAR SPINE WITHOUT CONTRAST TECHNIQUE: Multidetector CT imaging of the lumbar spine was performed without intravenous contrast administration. Multiplanar CT image reconstructions were also generated. COMPARISON:  08/24/2012 FINDINGS: Progressive compression deformity L1 vertebral body, with loss of about 75%. Interval progressive sclerosis of L1, with loss of inferior endplate cortex. Also, loss of superior endplate cortex of L2,  with sclerosis beneath the superior L2 endplate. There is erosive change on both sides of the L1-2 disk. There is increased attenuation in the paraspinous soft tissues at the L1-2 level with evidence of a right paraspinous mass. Again seen is retropulsion of L1 posterior cortex, causing mild canal narrowing. L3, L4, and L5 vertebral bodies stable. L1-2: Disc bulge and moderate facet hypertrophy causes moderate canal narrowing. L2-3: There is vacuum phenomenon in the disc. There is significant disc bulge with facet hypertrophy. There is significant canal and foraminal narrowing bilaterally. L3-4: Moderate facet hypertrophy. Mild disc bulge. Mild to moderate canal narrowing. Mild foraminal narrowing. L4-5:  Severe facet hypertrophy. Moderate disc bulge. Calcification of the ligamentum flavum. Severe canal narrowing. Moderate foraminal narrowing. L5-S1: Severe facet hypertrophy. Mild to moderate disc bulge. Mild canal narrowing. Mild foraminal narrowing bilaterally. Moderate calcification of the aortoiliac vessels. IMPRESSION: In addition to significant canal and foraminal narrowing throughout the lumbar spine due to disk and facet degenerative changes, there is further progressive compressive deformity of L1, and there is evidence of diskitis involving the L1-2 disk with changes in the adjoining endplates concerning for osteomyelitis. A right L1-2 paraspinous soft tissue mass is likely related to inflammation. MRI of the lumbar spine without and with contrast suggested to evaluate further. Electronically Signed   By: Skipper Cliche M.D.   On: 07/23/2015 17:39   Mr Lumbar Spine W Wo Contrast  07/24/2015  CLINICAL DATA:  Initial evaluation for possible osteomyelitis discitis. EXAM: MRI LUMBAR SPINE WITHOUT AND WITH CONTRAST TECHNIQUE: Multiplanar and multiecho pulse sequences of the lumbar spine were obtained without and with intravenous contrast. CONTRAST:  53mL MULTIHANCE GADOBENATE DIMEGLUMINE 529 MG/ML IV SOLN COMPARISON:  Prior CT from 07/23/2015. FINDINGS: Mild levoscoliosis with apex at L1-2 present. 3 mm anterolisthesis of L4 on L5. Vertebral bodies otherwise normally aligned with preservation of the normal lumbar lordosis. Chronic anterior height loss at the L1 vertebral body of approximately 50%. No significant bony retropulsion. Vertebral body heights otherwise maintained. Benign hemangioma noted within the T12 vertebral body. Signal intensity within the vertebral body bone marrow fairly patchy and heterogeneous. There is abnormal T1 hypo intense, T2/STIR hyperintense signal intensity within the L1 and L2 vertebral bodies, centered about the L1-2 disc space. Associated postcontrast enhancement. There is  abnormal edema and fluid signal intensity within the L1-2 disc itself. Findings consistent with osteomyelitis discitis. Associated phlegmonous change within the adjacent psoas muscle on the right. Possible tiny 3 mm psoas abscess noted (series 10, image 10). Edema within the adjacent L2-3 disc also concerning for possible discitis. There is a 14 mm T2 hyperintense, T1 hypo intense, enhancing lesion at the right posterior margin of the L1-2 interspace (Series 4, image 7). Are unclear whether this reflects a small abscess or possibly disc material. Abnormal enhancement within the ventral epidural space with probable small amount of epidural abscess posterior to the L2 vertebral body (series 10, image 12). Associated epidural enhancement within the posterior epidural space at the level of L1 it and L2 as well. Inflammatory signal changes are relatively confined to the L1 and L2 levels at this point, with no other significant inflammatory changes within the visualized spine. Conus medullaris terminates normally at the L1-2 level. Signal intensity within the visualized cord is normal. Nerve roots of the cauda equina within normal limits. Other than the changes related to the osteomyelitis discitis, paraspinous soft tissues within normal limits. T11-12: Seen only on sagittal projection. Disc desiccation without disc bulge. No stenosis. T12-L1: Degenerative  disc desiccation with minimal disc bulge. Bilateral facet arthrosis. Mild canal and bilateral foraminal narrowing. L1-2: Changes related to osteomyelitis discitis as above. Moderate canal stenosis. Superimposed moderate to advanced bilateral facet arthropathy present. Mild to moderate bilateral foraminal narrowing, worse on the right. L2-3: Diffuse degenerative disc bulge with disc desiccation. Possible changes related early discitis at this level noted. Mild epidural enhancement without significant epidural collection. Prominent bilateral facet arthrosis with  ligamentum flavum thickening. Moderate to severe canal stenosis. Mild bilateral foraminal narrowing. L3-4: No significant disc bulge or disc protrusion. Bilateral facet arthrosis. No significant canal or foraminal narrowing. L4-5: 3 mm anterolisthesis of L4 on L5. Associated disc bulge with severe bilateral facet arthrosis. Resultant severe canal stenosis with the thecal sac measuring 6 mm in AP diameter. Asymmetric fluid within the left L4-5 facet. Mild bilateral foraminal narrowing. L5-S1: Degenerative disc desiccation with intervertebral disc space narrowing and mild disc bulge. Bilateral facet arthrosis. No significant canal or foraminal stenosis. IMPRESSION: 1. Findings consistent with osteomyelitis discitis centered about the L1-2 intervertebral disc space, with associated epidural and paraspinous phlegmon as above. Possible small epidural abscess posterior to the L2 vertebral body as above. Probable small 3 mm abscess within the right psoas muscle. Resultant moderate canal stenosis at the L1-2 level. 2. Abnormal edema within the L2-3 disc as well, likely reflecting developing discitis. 3. 3 mm anterolisthesis of L4 on L5 with associated disc bulge and facet disease, resulting in severe canal stenosis. 4. Additional moderate multilevel degenerative spondylolysis and facet arthrosis as above. Please see the above report for a full description these findings. Electronically Signed   By: Jeannine Boga M.D.   On: 07/24/2015 05:25    Scheduled Meds: . aspirin EC  81 mg Oral Daily  . enoxaparin (LOVENOX) injection  40 mg Subcutaneous Q24H  . oxyCODONE  10 mg Oral Q12H  . rOPINIRole  2 mg Oral TID  . senna-docusate  1 tablet Oral QHS   Continuous Infusions: . sodium chloride 75 mL/hr at 07/24/15 0103      Time spent: 25 minutes    Daniel Ritthaler, [place]  [facility] Pager [phone]. If 7PM-7AM, please contact night-coverage at [email or web address], password [facility] 07/24/2015, 1:04 PM  LOS: 1 day

## 2015-07-24 NOTE — Progress Notes (Signed)
Hourly rounding performed. Call light within reach. Pt resting in bed with eyes closed. Will not wake pt up to offer PRN pain meds at this time.

## 2015-07-24 NOTE — Progress Notes (Signed)
rn paged md and made him aware that pt has been resting with eyes closed and snoring for last several hours, will wake upon verbal stimuli but will fall back asleep within 5 seconds.

## 2015-07-24 NOTE — Progress Notes (Signed)
rn explained that md had changed her dilaudid dose to every 4 hours, but that he had increased the dose. Pt was not happy stating "I can only hope that doctors gets the same thing I have and gets the same treatment I am getting". rn offered pt valium at that time, which pt took.

## 2015-07-24 NOTE — Progress Notes (Signed)
Gave update to family member at bedside. Pt resting with eyes closed, snoring.

## 2015-07-24 NOTE — Progress Notes (Signed)
Pt still lying in bed with eyes closed, snoring. Pt did not arouse even when IV was beeping. rn will not rouse pt at this time to offer PRN pain medications.

## 2015-07-24 NOTE — Telephone Encounter (Signed)
I called Dr. Harley Hallmark office-- I spoke to [place] and advised of below. She is going to check with Dr. Joya Salm tomorrow and call the patient. Left detailed mess informing pt.

## 2015-07-24 NOTE — Progress Notes (Signed)
rn went in to do assessment, pt would not keep eyes open for longer than 5 seconds, would fall back asleep and snore. Pt requesting more pain medication, but rn will not give IV pain med until pt more alert.

## 2015-07-24 NOTE — Consult Note (Signed)
[facility]       Reason for Consult: discitis and epidural abscess    Referring Physician: Dr. Clementeen Graham  Principal Problem:   Back pain Active Problems:   Rheumatoid arthritis ([place])   S/P ascending aortic replacement   Discitis   . aspirin EC  81 mg Oral Daily  . enoxaparin (LOVENOX) injection  40 mg Subcutaneous Q24H  . oxyCODONE  10 mg Oral Q12H  . rOPINIRole  2 mg Oral TID    Recommendations: Hold antibiotics pending biopsy IR guided aspiration for bacterial, AFB and Fungal cultures picc line   Assessment: She has an MRI with osteomyelitis, discitis at L1-2 intervertebral space and epirual paraspinous phlegmon.  Also some edema at L2-3.  She did receive empiric antibiotics but I biopsy still indicated.  She is on immunosuppressive medications so differential of organisms is broader and would check for AFB and fungal cultures as well as bacterial.  Pain - she did continually complain of pain  Antibiotics: Received vancomycin and cefepime prior to admission   HPI: Diane Abbott is a 81 y.o. female with rheumatoid arthritis, adrenal insufficiency, who came in with back pain. Pain started in January and was intermittent but did not initially interfere with activities of daily living.  It progressed some and then the day prior to admission she was unable to get out of bed after strenous activity the previous day.  No fever, no chills.  She did receive vancomycin and cefepime in the ED, being held now for consideration of a biopsy.   MRI independently reviewed and area noted. She continually complains of pain and does not want to discuss the plan of treatment for the infection.  She asks about pain control during IR biopsy, she asks why she continues to have pain and wants a 'pump'.    Review of Systems:  Unable to be assessed due to patient non cooperation   Past Medical History  Diagnosis Date  . Fibromyalgia   . Pulmonary embolism ([place])     . RA (rheumatoid arthritis) ([place])   . Vitamin B 12 deficiency   . Vitamin D deficiency   . Osteoarthritis   . Osteopenia   . Adrenal insufficiency ([place])   . IBS (irritable bowel syndrome)   . History of blood clots 2008    below knee  . Diabetes mellitus type II     pt denies this 03-05-11  . Esophagitis   . Depression   . GERD (gastroesophageal reflux disease)   . Connective tissue disorder ([place])   . Cataracts, bilateral     more in left than right  . Osteoarthritis of left shoulder 12/07/2011  . Heart murmur   . Leaky heart valve   . Anginal pain [facility])     "comes and goes has occured since 2012"  . Bronchitis Jan 2013-March 2013    severe  . Shortness of breath     comes from aneurysm  . Vertigo     hx of  . Urinary leakage     when coughing  . Sliding hiatal hernia   . Fatty liver   . History of echocardiogram     Echo 11/16: Mild LVH, EF 60-65%, normal wall motion, grade 1 diastolic dysfunction, bioprosthetic AVR with mean gradient 21 mmHg and no regurgitation, proximal aortic arch aneurysm 49 mm, moderate TR, PASP 31 mmHg    Social History  Substance Use Topics  . Smoking status: Never Smoker   .  Smokeless tobacco: Never Used  . Alcohol Use: Yes     Comment: occassional    Family History  Problem Relation Age of Onset  . Ovarian cancer Mother   . Heart disease Maternal Grandmother   . Clotting disorder    . Breast cancer    . Diabetes Mother   . Pancreatic cancer    . COPD Father   . Colon cancer Neg Hx     Allergies  Allergen Reactions  . Codeine Other (See Comments)    Blood pressure drops; "I pass out."  . Pramipexole Dihydrochloride Nausea Only    sick, falling  . Rofecoxib Other (See Comments)    "sleep walks"  . Arava [Leflunomide] Swelling  . Clonazepam   . Diclofenac Sodium   . Venlafaxine   . Alprazolam Other (See Comments)    "Makes me mean"  . Darvocet [Propoxyphene N-Acetaminophen] Other (See Comments)    "makes my eyes dilate. Pt  stated I can't see"  . Duloxetine Other (See Comments)    achy legs  . Gabapentin Other (See Comments)    headache  . Latex Itching and Dermatitis    When examined with latex gloves, burns and itches in contact areas per pt.  . Morphine And Related Other (See Comments)    Hallucinations   . Nortriptyline Hcl Other (See Comments)    Migraines   . Penicillins Rash    Has patient had a PCN reaction causing immediate rash, facial/tongue/throat swelling, SOB or lightheadedness with hypotension: Yes Has patient had a PCN reaction causing severe rash involving mucus membranes or skin necrosis: Yes Has patient had a PCN reaction that required hospitalization No Has patient had a PCN reaction occurring within the last 10 years: Yes If all of the above answers are "NO", then may proceed with Cephalosporin use.   Can take Cephalosporins  . Prednisone Swelling    Just doesn't want to take it.    Physical Exam: Constitutional: in no apparent distress and alert  Filed Vitals:   07/24/15 0533 07/24/15 0950  BP: 129/66 126/68  Pulse: 63 61  Temp: 98.9 F (37.2 C) 99 F (37.2 C)  Resp: 18 16   EYES: anicteric ENMT: nonthrush Cardiovascular: Cor RRR and 2/6 Holosystolic murmur Respiratory: CTA B; normal respiratory effort GI: Bowel sounds are normal, liver is not enlarged, spleen is not enlarged Musculoskeletal: no pedal edema noted Skin: negatives: no rash Hematologic: no cervical lad  Lab Results  Component Value Date   WBC 4.5 07/23/2015   HGB 11.7* 07/23/2015   HCT 34.5* 07/23/2015   MCV 95.3 07/23/2015   PLT 131* 07/23/2015    Lab Results  Component Value Date   CREATININE 1.09* 07/23/2015   BUN 18 07/23/2015   NA 139 07/23/2015   K 3.7 07/23/2015   CL 102 07/23/2015   CO2 30 07/23/2015    Lab Results  Component Value Date   ALT 14 07/01/2015   AST 25 07/01/2015   ALKPHOS 62 07/01/2015     Microbiology: Recent Results (from the past 240 hour(s))  Culture,  blood (routine x 2)     Status: None (Preliminary result)   Collection Time: 07/23/15  8:37 PM  Result Value Ref Range Status   Specimen Description RIGHT ANTECUBITAL  Final   Special Requests BOTTLES DRAWN AEROBIC AND ANAEROBIC 6CC  Final   Culture PENDING  Incomplete   Report Status PENDING  Incomplete  Culture, blood (routine x 2)     Status:  None (Preliminary result)   Collection Time: 07/23/15  8:50 PM  Result Value Ref Range Status   Specimen Description LEFT ANTECUBITAL  Final   Special Requests BOTTLES DRAWN AEROBIC AND ANAEROBIC 6CC  Final   Culture PENDING  Incomplete   Report Status PENDING  Incomplete  MRSA PCR Screening     Status: Abnormal   Collection Time: 07/24/15  1:20 AM  Result Value Ref Range Status   MRSA by PCR POSITIVE (A) NEGATIVE Final    Comment:        The GeneXpert MRSA Assay (FDA approved for NASAL specimens only), is one component of a comprehensive MRSA colonization surveillance program. It is not intended to diagnose MRSA infection nor to guide or monitor treatment for MRSA infections. RESULT CALLED TO, READ BACK BY AND VERIFIED WITH: M ENNIS,[id number]  07/24/15 MKELLY     Kalley Nicholl, Herbie [place], [facility] [email or web address] [phone] pager  [phone] cell 07/24/2015, 9:57 AM

## 2015-07-25 ENCOUNTER — Inpatient Hospital Stay (HOSPITAL_COMMUNITY): Payer: Medicare Other

## 2015-07-25 DIAGNOSIS — M545 Low back pain: Secondary | ICD-10-CM

## 2015-07-25 LAB — SEDIMENTATION RATE: SED RATE: 95 mm/h — AB (ref 0–22)

## 2015-07-25 LAB — CBC
HCT: 37 % (ref 36.0–46.0)
Hemoglobin: 12.2 g/dL (ref 12.0–15.0)
MCH: 31 pg (ref 26.0–34.0)
MCHC: 33 g/dL (ref 30.0–36.0)
MCV: 93.9 fL (ref 78.0–100.0)
PLATELETS: 169 10*3/uL (ref 150–400)
RBC: 3.94 MIL/uL (ref 3.87–5.11)
RDW: 12.8 % (ref 11.5–15.5)
WBC: 4.6 10*3/uL (ref 4.0–10.5)

## 2015-07-25 LAB — PROTIME-INR
INR: 1.2 (ref 0.00–1.49)
PROTHROMBIN TIME: 15.3 s — AB (ref 11.6–15.2)

## 2015-07-25 LAB — BASIC METABOLIC PANEL
Anion gap: 15 (ref 5–15)
BUN: 13 mg/dL (ref 6–20)
CALCIUM: 8.9 mg/dL (ref 8.9–10.3)
CO2: 23 mmol/L (ref 22–32)
CREATININE: 0.99 mg/dL (ref 0.44–1.00)
Chloride: 100 mmol/L — ABNORMAL LOW (ref 101–111)
GFR calc Af Amer: >60 mL/min (ref >60)
GFR, EST NON AFRICAN AMERICAN: 53 mL/min — AB (ref >60)
Glucose, Bld: 75 mg/dL (ref 65–99)
Potassium: 3.5 mmol/L (ref 3.5–5.1)
Sodium: 138 mmol/L (ref 135–145)

## 2015-07-25 LAB — C-REACTIVE PROTEIN: CRP: 18.9 mg/dL — AB (ref <1.0)

## 2015-07-25 MED ORDER — FENTANYL CITRATE (PF) 100 MCG/2ML IJ SOLN
INTRAMUSCULAR | Status: AC | PRN
  Administered 2015-07-25: 25 ug via INTRAVENOUS
  Administered 2015-07-25: 50 ug via INTRAVENOUS

## 2015-07-25 MED ORDER — SODIUM CHLORIDE 0.9 % IJ SOLN
INTRAMUSCULAR | Status: AC
  Filled 2015-07-25: qty 10

## 2015-07-25 MED ORDER — HYDROMORPHONE HCL 1 MG/ML IJ SOLN: 0.5000 mg | INTRAMUSCULAR | Status: DC | PRN

## 2015-07-25 MED ORDER — MIDAZOLAM HCL 2 MG/2ML IJ SOLN
INTRAMUSCULAR | Status: AC | PRN
Start: 2015-07-25 — End: 2015-07-25
  Administered 2015-07-25 (×2): 1 mg via INTRAVENOUS

## 2015-07-25 MED ORDER — DEXTROSE 5 % IV SOLN
2.0000 g | INTRAVENOUS | Status: DC
  Administered 2015-07-25 – 2015-07-29 (×5): 2 g via INTRAVENOUS
  Filled 2015-07-25 (×5): qty 2

## 2015-07-25 MED ORDER — SODIUM CHLORIDE 0.9 % IV SOLN
INTRAVENOUS | Status: AC
Start: 2015-07-25 — End: 2015-07-25
  Administered 2015-07-25: 11:00:00 via INTRAVENOUS

## 2015-07-25 MED ORDER — HYDROMORPHONE HCL 1 MG/ML IJ SOLN
INTRAMUSCULAR | Status: AC
  Filled 2015-07-25: qty 1

## 2015-07-25 MED ORDER — MIDAZOLAM HCL 2 MG/2ML IJ SOLN
INTRAMUSCULAR | Status: AC
  Filled 2015-07-25: qty 2

## 2015-07-25 MED ORDER — VANCOMYCIN HCL IN DEXTROSE 1-5 GM/200ML-% IV SOLN
1000.0000 mg | Freq: Once | INTRAVENOUS | Status: AC
  Administered 2015-07-25: 1000 mg via INTRAVENOUS
  Filled 2015-07-25: qty 200

## 2015-07-25 MED ORDER — BUPIVACAINE HCL (PF) 0.25 % IJ SOLN
INTRAMUSCULAR | Status: AC
  Filled 2015-07-25: qty 30

## 2015-07-25 MED ORDER — OXYCODONE-ACETAMINOPHEN 5-325 MG PO TABS
2.0000 | ORAL_TABLET | ORAL | Status: DC | PRN
  Administered 2015-07-25 – 2015-07-29 (×19): 2 via ORAL
  Filled 2015-07-25 (×19): qty 2

## 2015-07-25 MED ORDER — VANCOMYCIN HCL IN DEXTROSE 750-5 MG/150ML-% IV SOLN
750.0000 mg | Freq: Two times a day (BID) | INTRAVENOUS | Status: DC
  Administered 2015-07-26 – 2015-07-29 (×7): 750 mg via INTRAVENOUS
  Filled 2015-07-25 (×8): qty 150

## 2015-07-25 MED ORDER — FENTANYL CITRATE (PF) 100 MCG/2ML IJ SOLN
INTRAMUSCULAR | Status: AC
  Filled 2015-07-25: qty 2

## 2015-07-25 MED ORDER — HYDROMORPHONE HCL 1 MG/ML IJ SOLN: 1.0000 mg | INTRAMUSCULAR | Status: DC | PRN

## 2015-07-25 NOTE — Sedation Documentation (Signed)
Patient is resting comfortably. 

## 2015-07-25 NOTE — Procedures (Signed)
S/P L1-L2 fluoro guided disc aspiration. X 3 passes .Approx 2.5 ml of thick bloody aspirate obtained and sent for microbiological analysis.

## 2015-07-25 NOTE — Progress Notes (Signed)
[facility] PROGRESS NOTE  Diane Abbott JJK:[id number] DOB: 1935/03/31 DOA: 07/23/2015 PCP: Walker Kehr, MD   Brief narrative 81 year old with history of rheumatoid arthritis insufficiency, GERD presented with one-week history of progressive pain in her lower back, sharp shooting down to her legs. Denies any trauma, tingling or numbness, weakness of her extremities or bowel or urinary incontinence. (Has chronic urinary frequency which is unchanged and has been constipated for past 4 days). Denies any recent illness, fevers, chills, headache, blurred vision. On presentation a CT scan of the lumbar spine was done which showed possible discitis around L1-L2 concerning for a semi-lipase with right paraspinal muscle mass. An MRI of the lumbar spine was done which showed osteomyelitis discitis over L1-L2 intervertebral disc space with associated epidural and paraspinous phlegmon. Possible small epidural abscess posterior to the L2 vertebral body also seen and a probable small 3 mg abscess within the right psoas muscle. There is moderate Stenosis at the level of L1-L2. Also seen abnormal edema within the L2-L3 disc likely reflecting a developing discitis. Has severe canal stenosis over L4-L5 and multilevel degenerative spondylosis and facet arthrosis. Patient received a dose of IV vancomycin and cefepime. Admitted to hospitalist service.   Assessment/Plan: Acute lumbar discitis/osteomyelitis with possible epidural and psoas abscess Discontinued further antibiotics to get better yield from lumbar biopsy. Seen by neurosurgery who recommended IR biopsy of the L1-L2 space.  ID following . -IR consulted and patient underwent fluoroscopy guided L1-L2 disc aspiration with 2.5 mL of thick bloody aspirate. Sent for cultures including AFB and fungal cultures. Elevated ESR and CRP. -Have resumed empiric vancomycin and cefepime.  narrow antibiotics based on culture. Will need a PICC line for long-term  antibiotics. Check 2-D echo given systolic murmur and history of bioprosthetic valve. blood culture a negative. Pain control as below. PT/OT evaluation.   Severe low back pain Patient consistently asking for pain medications . Was snoring all day after receiving current doses of Dilaudid. Change Dilaudid to 0.5 mg every 4 hours as needed. Added Percocet every 4 hours as needed. Continue OxyContin every 12 hours.  Robaxin every 6 hours as needed for muscle spasms.   Rheumatoid arthritis Stable.  History of abdominal aortic aneurysm repair and aortic valve replacement. As per her last imaging of the abdominal aortic aneurysm measured 4.1 cm. Check repeat 2-D echo.   CKD stage 3 Stable.  Chronic anemia Stable.  Constipation added stool softeners.   DVT prophylaxis: Subcutaneous Lovenox  Diet: Regular   Code Status: Full code  Family Communication:None at bedside  Disposition Plan: Pending PT eval, PICC line placement  Consultants: Infectious disease Neurosurgery IR consulted  Procedures: CT lumbar spine MRI lumbar spine    Antibiotics: Vancomycin and cefepime 1 dose on 3/22 Vancomycin and cefepime 3/23  HPI/Subjective: Seen and examined to returning from procedure. Complains of ongoing back pain not being happy with pain management.  Objective: Filed Vitals:   07/25/15 1002 07/25/15 1006  BP: 133/89 137/88  Pulse: 84 82  Temp:    Resp: 11 10    Intake/Output Summary (Last 24 hours) at 07/25/15 1109 Last data filed at 07/24/15 2154  Gross per 24 hour  Intake      0 ml  Output    128 ml  Net   -128 ml   Filed Weights   07/23/15 1448 07/23/15 2201  Weight: 73.483 kg (162 lb) 72.1 kg (158 lb 15.2 oz)    Exam:   General:  Not in distress  HEENT:  moist mucosa    chest: Clear bilaterally   CVS: Normal S1 and S2, systolic murmur 3/6, no  rub or gallop   GI: Soft, nondistended, nontender, bowel sounds present   Musculoskeletal: Warm,  tenderness or lumbar spine area,  Data Reviewed: Basic Metabolic Panel:  Recent Labs Lab 07/23/15 1631 07/25/15 0556  NA 139 138  K 3.7 3.5  CL 102 100*  CO2 30 23  GLUCOSE 99 75  BUN 18 13  CREATININE 1.09* 0.99  CALCIUM 8.8* 8.9   Liver Function Tests: No results for input(s): AST, ALT, ALKPHOS, BILITOT, PROT, ALBUMIN in the last 168 hours. No results for input(s): LIPASE, AMYLASE in the last 168 hours. No results for input(s): AMMONIA in the last 168 hours. CBC:  Recent Labs Lab 07/23/15 1631 07/25/15 0556  WBC 4.5 4.6  NEUTROABS 2.9  --   HGB 11.7* 12.2  HCT 34.5* 37.0  MCV 95.3 93.9  PLT 131* 169   Cardiac Enzymes: No results for input(s): CKTOTAL, CKMB, CKMBINDEX, TROPONINI in the last 168 hours. BNP (last 3 results) No results for input(s): BNP in the last 8760 hours.  ProBNP (last 3 results) No results for input(s): PROBNP in the last 8760 hours.  CBG: No results for input(s): GLUCAP in the last 168 hours.  Recent Results (from the past 240 hour(s))  Culture, blood (routine x 2)     Status: None (Preliminary result)   Collection Time: 07/23/15  8:37 PM  Result Value Ref Range Status   Specimen Description RIGHT ANTECUBITAL  Final   Special Requests BOTTLES DRAWN AEROBIC AND ANAEROBIC 6CC  Final   Culture NO GROWTH < 24 HOURS  Final   Report Status PENDING  Incomplete  Culture, blood (routine x 2)     Status: None (Preliminary result)   Collection Time: 07/23/15  8:50 PM  Result Value Ref Range Status   Specimen Description LEFT ANTECUBITAL  Final   Special Requests BOTTLES DRAWN AEROBIC AND ANAEROBIC 6CC  Final   Culture NO GROWTH < 24 HOURS  Final   Report Status PENDING  Incomplete  MRSA PCR Screening     Status: Abnormal   Collection Time: 07/24/15  1:20 AM  Result Value Ref Range Status   MRSA by PCR POSITIVE (A) NEGATIVE Final    Comment:        The GeneXpert MRSA Assay (FDA approved for NASAL specimens only), is one component of  a comprehensive MRSA colonization surveillance program. It is not intended to diagnose MRSA infection nor to guide or monitor treatment for MRSA infections. RESULT CALLED TO, READ BACK BY AND VERIFIED WITH: M ENNIS,RN'@[id number]'  07/24/15 MKELLY      Studies: Dg Thoracic Spine 2 View  07/23/2015  CLINICAL DATA:  Low back injury EXAM: THORACIC SPINE 2 VIEWS COMPARISON:  03/25/2015 FINDINGS: T3 and L1 compression fractures are stable. Osteopenia. No new fracture. Degenerative changes and slight dextroscoliosis are stable. IMPRESSION: Stable T3 and L1 compression fractures.  No acute bony pathology. Electronically Signed   By: Marybelle Killings M.D.   On: 07/23/2015 17:24   Ct Lumbar Spine Wo Contrast  07/23/2015  CLINICAL DATA:  Cleaned organ at church and clean/waxed daughter car about 7 days ago. Pt has been in bed for last 6-7 days, using depends because she has not been able to walk. EXAM: CT LUMBAR SPINE WITHOUT CONTRAST TECHNIQUE: Multidetector CT imaging of the lumbar spine was performed without intravenous contrast administration. Multiplanar CT image reconstructions were also  generated. COMPARISON:  08/24/2012 FINDINGS: Progressive compression deformity L1 vertebral body, with loss of about 75%. Interval progressive sclerosis of L1, with loss of inferior endplate cortex. Also, loss of superior endplate cortex of L2, with sclerosis beneath the superior L2 endplate. There is erosive change on both sides of the L1-2 disk. There is increased attenuation in the paraspinous soft tissues at the L1-2 level with evidence of a right paraspinous mass. Again seen is retropulsion of L1 posterior cortex, causing mild canal narrowing. L3, L4, and L5 vertebral bodies stable. L1-2: Disc bulge and moderate facet hypertrophy causes moderate canal narrowing. L2-3: There is vacuum phenomenon in the disc. There is significant disc bulge with facet hypertrophy. There is significant canal and foraminal narrowing bilaterally.  L3-4: Moderate facet hypertrophy. Mild disc bulge. Mild to moderate canal narrowing. Mild foraminal narrowing. L4-5: Severe facet hypertrophy. Moderate disc bulge. Calcification of the ligamentum flavum. Severe canal narrowing. Moderate foraminal narrowing. L5-S1: Severe facet hypertrophy. Mild to moderate disc bulge. Mild canal narrowing. Mild foraminal narrowing bilaterally. Moderate calcification of the aortoiliac vessels. IMPRESSION: In addition to significant canal and foraminal narrowing throughout the lumbar spine due to disk and facet degenerative changes, there is further progressive compressive deformity of L1, and there is evidence of diskitis involving the L1-2 disk with changes in the adjoining endplates concerning for osteomyelitis. A right L1-2 paraspinous soft tissue mass is likely related to inflammation. MRI of the lumbar spine without and with contrast suggested to evaluate further. Electronically Signed   By: Skipper Cliche M.D.   On: 07/23/2015 17:39   Mr Lumbar Spine W Wo Contrast  07/24/2015  CLINICAL DATA:  Initial evaluation for possible osteomyelitis discitis. EXAM: MRI LUMBAR SPINE WITHOUT AND WITH CONTRAST TECHNIQUE: Multiplanar and multiecho pulse sequences of the lumbar spine were obtained without and with intravenous contrast. CONTRAST:  57m MULTIHANCE GADOBENATE DIMEGLUMINE 529 MG/ML IV SOLN COMPARISON:  Prior CT from 07/23/2015. FINDINGS: Mild levoscoliosis with apex at L1-2 present. 3 mm anterolisthesis of L4 on L5. Vertebral bodies otherwise normally aligned with preservation of the normal lumbar lordosis. Chronic anterior height loss at the L1 vertebral body of approximately 50%. No significant bony retropulsion. Vertebral body heights otherwise maintained. Benign hemangioma noted within the T12 vertebral body. Signal intensity within the vertebral body bone marrow fairly patchy and heterogeneous. There is abnormal T1 hypo intense, T2/STIR hyperintense signal intensity within  the L1 and L2 vertebral bodies, centered about the L1-2 disc space. Associated postcontrast enhancement. There is abnormal edema and fluid signal intensity within the L1-2 disc itself. Findings consistent with osteomyelitis discitis. Associated phlegmonous change within the adjacent psoas muscle on the right. Possible tiny 3 mm psoas abscess noted (series 10, image 10). Edema within the adjacent L2-3 disc also concerning for possible discitis. There is a 14 mm T2 hyperintense, T1 hypo intense, enhancing lesion at the right posterior margin of the L1-2 interspace (Series 4, image 7). Are unclear whether this reflects a small abscess or possibly disc material. Abnormal enhancement within the ventral epidural space with probable small amount of epidural abscess posterior to the L2 vertebral body (series 10, image 12). Associated epidural enhancement within the posterior epidural space at the level of L1 it and L2 as well. Inflammatory signal changes are relatively confined to the L1 and L2 levels at this point, with no other significant inflammatory changes within the visualized spine. Conus medullaris terminates normally at the L1-2 level. Signal intensity within the visualized cord is normal. Nerve roots of the  cauda equina within normal limits. Other than the changes related to the osteomyelitis discitis, paraspinous soft tissues within normal limits. T11-12: Seen only on sagittal projection. Disc desiccation without disc bulge. No stenosis. T12-L1: Degenerative disc desiccation with minimal disc bulge. Bilateral facet arthrosis. Mild canal and bilateral foraminal narrowing. L1-2: Changes related to osteomyelitis discitis as above. Moderate canal stenosis. Superimposed moderate to advanced bilateral facet arthropathy present. Mild to moderate bilateral foraminal narrowing, worse on the right. L2-3: Diffuse degenerative disc bulge with disc desiccation. Possible changes related early discitis at this level noted.  Mild epidural enhancement without significant epidural collection. Prominent bilateral facet arthrosis with ligamentum flavum thickening. Moderate to severe canal stenosis. Mild bilateral foraminal narrowing. L3-4: No significant disc bulge or disc protrusion. Bilateral facet arthrosis. No significant canal or foraminal narrowing. L4-5: 3 mm anterolisthesis of L4 on L5. Associated disc bulge with severe bilateral facet arthrosis. Resultant severe canal stenosis with the thecal sac measuring 6 mm in AP diameter. Asymmetric fluid within the left L4-5 facet. Mild bilateral foraminal narrowing. L5-S1: Degenerative disc desiccation with intervertebral disc space narrowing and mild disc bulge. Bilateral facet arthrosis. No significant canal or foraminal stenosis. IMPRESSION: 1. Findings consistent with osteomyelitis discitis centered about the L1-2 intervertebral disc space, with associated epidural and paraspinous phlegmon as above. Possible small epidural abscess posterior to the L2 vertebral body as above. Probable small 3 mm abscess within the right psoas muscle. Resultant moderate canal stenosis at the L1-2 level. 2. Abnormal edema within the L2-3 disc as well, likely reflecting developing discitis. 3. 3 mm anterolisthesis of L4 on L5 with associated disc bulge and facet disease, resulting in severe canal stenosis. 4. Additional moderate multilevel degenerative spondylolysis and facet arthrosis as above. Please see the above report for a full description these findings. Electronically Signed   By: Jeannine Boga M.D.   On: 07/24/2015 05:25    Scheduled Meds: . aspirin EC  81 mg Oral Daily  . bupivacaine (PF)      . ceFEPime (MAXIPIME) IV  2 g Intravenous Q24H  . enoxaparin (LOVENOX) injection  40 mg Subcutaneous Q24H  . fentaNYL      . midazolam      . oxyCODONE  10 mg Oral Q12H  . rOPINIRole  2 mg Oral TID  . senna-docusate  1 tablet Oral QHS  . sodium chloride      . vancomycin  1,000 mg  Intravenous Once  . [START ON 07/26/2015] vancomycin  750 mg Intravenous Q12H   Continuous Infusions: . sodium chloride 1,000 mL (07/25/15 0524)  . sodium chloride        Time spent: 25 minutes    Nezar Buckles  [facility] Pager [phone]. If 7PM-7AM, please contact night-coverage at [email or web address], password [facility] 07/25/2015, 11:09 AM  LOS: 2 days

## 2015-07-25 NOTE — Progress Notes (Signed)
Gave patient Lovenox at 2026 3/22, will put sign on door to make sure this does not happen again prior to her surgery.

## 2015-07-25 NOTE — Progress Notes (Signed)
Pharmacy Antibiotic Note  Diane Abbott is a 81 y.o. female admitted on 07/23/2015 with discitis/osteo.  Pharmacy has been consulted for vancomycin and cefepime dosing.  Patient did receive antibiotics earlier in admission, but were then held pending biopsy- completed today in IR. Sample sent for culture.  Plan: -vancomycin load with 1000mg  IV x1, then 750mg  IV q12h. Goal trough 15-9mcg/mL -cefepime 2g IV q24h -f/u renal function, clinical progression, LOT, trough at SS -F/U ECHO -F/U results of biopsy  Height: 5\' 2"  (157.5 cm) Weight: 158 lb 15.2 oz (72.1 kg) IBW/kg (Calculated) : 50.1  Temp (24hrs), Avg:98.3 F (36.8 C), Min:97.7 F (36.5 C), Max:98.8 F (37.1 C)   Recent Labs Lab 07/23/15 1631 07/25/15 0556  WBC 4.5 4.6  CREATININE 1.09* 0.99    Estimated Creatinine Clearance: 42.8 mL/min (by C-G formula based on Cr of 0.99).    Allergies  Allergen Reactions  . Codeine Other (See Comments)    Blood pressure drops; "I pass out."  . Pramipexole Dihydrochloride Nausea Only    sick, falling  . Rofecoxib Other (See Comments)    "sleep walks"  . Arava [Leflunomide] Swelling  . Clonazepam   . Diclofenac Sodium   . Venlafaxine   . Alprazolam Other (See Comments)    "Makes me mean"  . Darvocet [Propoxyphene N-Acetaminophen] Other (See Comments)    "makes my eyes dilate. Pt stated I can't see"  . Duloxetine Other (See Comments)    achy legs  . Gabapentin Other (See Comments)    headache  . Latex Itching and Dermatitis    When examined with latex gloves, burns and itches in contact areas per pt.  . Morphine And Related Other (See Comments)    Hallucinations   . Nortriptyline Hcl Other (See Comments)    Migraines   . Penicillins Rash    Has patient had a PCN reaction causing immediate rash, facial/tongue/throat swelling, SOB or lightheadedness with hypotension: Yes Has patient had a PCN reaction causing severe rash involving mucus membranes or skin necrosis:  Yes Has patient had a PCN reaction that required hospitalization No Has patient had a PCN reaction occurring within the last 10 years: Yes If all of the above answers are "NO", then may proceed with Cephalosporin use.   Can take Cephalosporins  . Prednisone Swelling    Just doesn't want to take it.    Antimicrobials this admission: Vancomycin 3/21 x1; 3/23>> Cefepime 3/21 x1; 3/23>>  Dose adjustments this admission: N/A  Microbiology results: 3/23 aspirate: sent 3/21 Bcx: ngtd 3/22 MRSA PCR: pos  Thank you for allowing pharmacy to be a part of this patient's care.  Kili Gracy D. Jayle Solarz, PharmD, BCPS Clinical Pharmacist Pager: [phone] 07/25/2015 10:58 AM

## 2015-07-26 DIAGNOSIS — Z95828 Presence of other vascular implants and grafts: Secondary | ICD-10-CM

## 2015-07-26 DIAGNOSIS — B370 Candidal stomatitis: Secondary | ICD-10-CM

## 2015-07-26 DIAGNOSIS — M4647 Discitis, unspecified, lumbosacral region: Secondary | ICD-10-CM | POA: Diagnosis present

## 2015-07-26 DIAGNOSIS — G062 Extradural and subdural abscess, unspecified: Secondary | ICD-10-CM | POA: Diagnosis present

## 2015-07-26 LAB — ACID FAST SMEAR (AFB, MYCOBACTERIA)

## 2015-07-26 LAB — ACID FAST SMEAR (AFB): ACID FAST SMEAR - AFSCU2: NEGATIVE

## 2015-07-26 MED ORDER — FLUCONAZOLE 100 MG PO TABS
200.0000 mg | ORAL_TABLET | Freq: Every day | ORAL | Status: DC
  Administered 2015-07-26 – 2015-07-29 (×4): 200 mg via ORAL
  Filled 2015-07-26 (×4): qty 2

## 2015-07-26 NOTE — Care Management Note (Signed)
Case Management Note  Patient Details  Name: Diane Abbott MRN: [id number] Date of Birth: Sep 12, 1934  Subjective/Objective:                    Action/Plan: Plan is for placement of PICC and IV abx. CM continuing to follow for discharge needs.   Expected Discharge Date:                  Expected Discharge Plan:     In-House Referral:     Discharge planning Services     Post Acute Care Choice:    Choice offered to:     DME Arranged:    DME Agency:     HH Arranged:    HH Agency:     Status of Service:  In process, will continue to follow  Medicare Important Message Given:    Date Medicare IM Given:    Medicare IM give by:    Date Additional Medicare IM Given:    Additional Medicare Important Message give by:     If discussed at [place] of Stay Meetings, dates discussed:    Additional Comments:  Pollie Friar, RN 07/26/2015, 10:59 AM

## 2015-07-26 NOTE — Care Management Note (Signed)
Case Management Note  Patient Details  Name: Diane Abbott MRN: [id number] Date of Birth: [birth year]  Subjective/Objective:                    Action/Plan: CM received a consult for [facility] IV antibiotics with [facility]. CM met with the patient and she does not have support at home and does not feel comfortable going home with IV therapy. She is interested in going to rehab for the duration of the IV therapy. She states her first choice would be [facility]. MD and CSW informed.   Expected Discharge Date:                  Expected Discharge Plan:     In-House Referral:     Discharge planning Services     Post Acute Care Choice:    Choice offered to:     DME Arranged:    DME Agency:     HH Arranged:    HH Agency:     Status of Service:  In process, will continue to follow  Medicare Important Message Given:    Date Medicare IM Given:    Medicare IM give by:    Date Additional Medicare IM Given:    Additional Medicare Important Message give by:     If discussed at [place] of Stay Meetings, dates discussed:    Additional Comments:  Pollie Friar, RN 07/26/2015, 4:14 PM

## 2015-07-26 NOTE — Clinical Documentation Improvement (Signed)
Internal Medicine  Please clarify the acuity of the patient's Osteomyelitis and document findings in next progress note. Thank you!   Acute Osteomyelitis  Subacute Osteomyelitis  Chronic Osteomyelitis  Other  Clinically Undetermined  Supporting Information:  Please exercise your independent, professional judgment when responding. A specific answer is not anticipated or expected.  Thank You, Zoila Shutter RN, BSN, [place] [phone]; Cell: [phone]

## 2015-07-26 NOTE — Progress Notes (Addendum)
[place] for Infectious Disease   Reason for visit: Follow up on discitis/osteomyelitis  Interval History: she had IR biopsy yesterday, moderate WBCs noted, culture ngtd.    Physical Exam: Constitutional:  Filed Vitals:   07/26/15 0537 07/26/15 0900  BP: 130/65 114/48  Pulse: 82 64  Temp: 98.1 F (36.7 C) 99.1 F (37.3 C)  Resp: 14    patient appears in NAD, tearful after some discussion of her daughters experience. Respiratory: Normal respiratory effort; CTA B Cardiovascular: RRR  Review of Systems: Constitutional: negative for fevers and chills Gastrointestinal: negative for diarrhea  Lab Results  Component Value Date   WBC 4.6 07/25/2015   HGB 12.2 07/25/2015   HCT 37.0 07/25/2015   MCV 93.9 07/25/2015   PLT 169 07/25/2015    Lab Results  Component Value Date   CREATININE 0.99 07/25/2015   BUN 13 07/25/2015   NA 138 07/25/2015   K 3.5 07/25/2015   CL 100* 07/25/2015   CO2 23 07/25/2015    Lab Results  Component Value Date   ALT 14 07/01/2015   AST 25 07/01/2015   ALKPHOS 62 07/01/2015     Microbiology: Recent Results (from the past 240 hour(s))  Culture, blood (routine x 2)     Status: None (Preliminary result)   Collection Time: 07/23/15  8:37 PM  Result Value Ref Range Status   Specimen Description BLOOD RIGHT ANTECUBITAL  Final   Special Requests BOTTLES DRAWN AEROBIC AND ANAEROBIC 6CC  Final   Culture NO GROWTH 2 DAYS  Final   Report Status PENDING  Incomplete  Culture, blood (routine x 2)     Status: None (Preliminary result)   Collection Time: 07/23/15  8:50 PM  Result Value Ref Range Status   Specimen Description BLOOD LEFT ANTECUBITAL  Final   Special Requests BOTTLES DRAWN AEROBIC AND ANAEROBIC 6CC  Final   Culture NO GROWTH 2 DAYS  Final   Report Status PENDING  Incomplete  MRSA PCR Screening     Status: Abnormal   Collection Time: 07/24/15  1:20 AM  Result Value Ref Range Status   MRSA by PCR POSITIVE (A) NEGATIVE Final   Comment:        The GeneXpert MRSA Assay (FDA approved for NASAL specimens only), is one component of a comprehensive MRSA colonization surveillance program. It is not intended to diagnose MRSA infection nor to guide or monitor treatment for MRSA infections. RESULT CALLED TO, READ BACK BY AND VERIFIED WITH: M ENNIS,RN@0420  07/24/15 MKELLY   Acid Fast Smear (AFB)     Status: None   Collection Time: 07/25/15 10:08 AM  Result Value Ref Range Status   AFB Specimen Processing Concentration  Final   Acid Fast Smear Negative  Final    Comment: (NOTE) Performed At: [facility] [address] [place], [place] [id number] Lindon Romp MD [id number]    Source (AFB) ABSCESS  Final    Comment: DISC L1 L2  Culture, routine-abscess     Status: None (Preliminary result)   Collection Time: 07/25/15 10:08 AM  Result Value Ref Range Status   Specimen Description ABSCESS  Final   Special Requests DISC L1 L2  Final   Gram Stain   Final    MODERATE WBC PRESENT,BOTH PMN AND MONONUCLEAR NO SQUAMOUS EPITHELIAL CELLS SEEN NO ORGANISMS SEEN Performed at [facility]    Culture   Final    NO GROWTH 1 DAY Performed at [facility]  Report Status PENDING  Incomplete    Impression/Plan:  1. Discitis/osteomyelitis - on vancomycin and cefepime and would continue this for a minimum of 6 weeks through at least May 4th per home health protocol inclduing twice weekly bmp, vancomycin trough picc line Can narrow depending on culture  I had a long discussion with the patient regarding the treatment plan and duration.  She has expressed a lot of distrust in infectious disease providers due to her daughter, who about 30 years ago, had a disc infection and does not feel she was treated with a long enough course of antibiotics (apparently was on it several months) and this was here at [facility]. The daughter apparently died of this.  She became tearful.  She obviously has a very  distressful past here and I did suggest that she consider an infectious disease provider outside of [facility] if that would make her more comfortable and less traumatic.  She asked about pain and I did tell her that some people with this do end up with residual pain.  She expressed the desire to me that she would like to make the decision of when to stop the antibiotics and does not think she should stop until the pain is gone.  I did explain that we will make the decision together but that ultimately she will need to stop and that the decision to continue will need to be agreed upon and not decided by her.  She continued to express her distrust and I recommend referral to ID in the surrounding communities.  We of course will be happy to see her otherwise if needed.  35 minutes spent with 20 minutes in face to face discussion  2. Possible thrush - she reports getting thrush with antibiotics and feeling like it now so started fluconazole.    Dr. Megan Salon will follow the culture over the weekend, otherwise I will see again Monday. thanks

## 2015-07-26 NOTE — Progress Notes (Signed)
[facility] PROGRESS NOTE  Diane Abbott IDP:[id number] DOB: 04-16-1935 DOA: 07/23/2015 PCP: Walker Kehr, MD   Brief narrative 81 year old with history of rheumatoid arthritis insufficiency, GERD presented with one-week history of progressive pain in her lower back, sharp shooting down to her legs. Denies any trauma, tingling or numbness, weakness of her extremities or bowel or urinary incontinence. (Has chronic urinary frequency which is unchanged and has been constipated for past 4 days). Denies any recent illness, fevers, chills, headache, blurred vision. On presentation a CT scan of the lumbar spine was done which showed possible discitis around L1-L2 concerning for a semi-lipase with right paraspinal muscle mass. An MRI of the lumbar spine was done which showed osteomyelitis discitis over L1-L2 intervertebral disc space with associated epidural and paraspinous phlegmon. Possible small epidural abscess posterior to the L2 vertebral body also seen and a probable small 3 mg abscess within the right psoas muscle. There is moderate Stenosis at the level of L1-L2. Also seen abnormal edema within the L2-L3 disc likely reflecting a developing discitis. Has severe canal stenosis over L4-L5 and multilevel degenerative spondylosis and facet arthrosis. Patient received a dose of IV vancomycin and cefepime. Admitted to hospitalist service.   Assessment/Plan: Acute lumbar discitis/osteomyelitis with possible epidural and psoas abscess Seen by neurosurgery who recommended IR biopsy of the L1-L2 space.  ID following . -atient underwent fluoroscopy guided L1-L2 disc aspiration with 2.5 mL of thick bloody aspirate by IR. Sent for cultures including AFB and fungal cultures. Elevated ESR and CRP. - resumed empiric vancomycin and cefepime.  narrow antibiotics based on culture. Will need a PICC line for long-term antibiotics. Check 2-D echo given systolic murmur and history of bioprosthetic valve shows  normal EF with stable aortic valve. Localized aortic arch aneurysm measuring 49 mm. Blood cultures negative.  Pain control as below. PT/OT evaluation. -PICC line ordered for prolonged IV antibiotics.   Severe low back pain Reports her back pain to be stable on current regimen.  Dilaudid 0.5 mg every 4 hours as needed. Percocet every 4 hours as needed. Continue OxyContin every 12 hours.  Robaxin every 6 hours as needed for muscle spasms.   Rheumatoid arthritis Stable.  History of abdominal aortic aneurysm repair and aortic valve replacement. As per her last imaging of the abdominal aortic aneurysm measured 4.1 cm. 2-D echo shows aneurysm measuring 4.9 cm. Patient should follow-up with Dr. Cyndia Bent as outpatient.   CKD stage 3 Stable.  Chronic anemia Stable.  Constipation added stool softeners.   DVT prophylaxis: Subcutaneous Lovenox  Diet: Regular   Code Status: Full code  Family Communication:None at bedside  Disposition Plan: Pending PT eval, PICC line placement. Likely discharge to skilled nursing facility early next week.  Consultants: Infectious disease Neurosurgery IR consulted  Procedures: CT lumbar spine MRI lumbar spine  2-D echo   Antibiotics: Vancomycin and cefepime 1 dose on 3/22 Vancomycin and cefepime 3/23  HPI/Subjective: Seen and examined. Reports her back pain to be much controlled on current pain regimen.  Objective: Filed Vitals:   07/26/15 0537 07/26/15 0900  BP: 130/65 114/48  Pulse: 82 64  Temp: 98.1 F (36.7 C) 99.1 F (37.3 C)  Resp: 14     Intake/Output Summary (Last 24 hours) at 07/26/15 1132 Last data filed at 07/25/15 1401  Gross per 24 hour  Intake      0 ml  Output    250 ml  Net   -250 ml   Filed Weights   07/23/15 1448  07/23/15 2201  Weight: 73.483 kg (162 lb) 72.1 kg (158 lb 15.2 oz)    Exam:   General:  Not in distress  HEENT:  moist mucosa    chest: Clear bilaterally   CVS: Normal S1 and S2,  systolic murmur 3/6, no  rub or gallop   GI: Soft, nondistended, nontender, bowel sounds present   Musculoskeletal: Warm, Limited mobility due to low back pain.  Data Reviewed: Basic Metabolic Panel:  Recent Labs Lab 07/23/15 1631 07/25/15 0556  NA 139 138  K 3.7 3.5  CL 102 100*  CO2 30 23  GLUCOSE 99 75  BUN 18 13  CREATININE 1.09* 0.99  CALCIUM 8.8* 8.9   Liver Function Tests: No results for input(s): AST, ALT, ALKPHOS, BILITOT, PROT, ALBUMIN in the last 168 hours. No results for input(s): LIPASE, AMYLASE in the last 168 hours. No results for input(s): AMMONIA in the last 168 hours. CBC:  Recent Labs Lab 07/23/15 1631 07/25/15 0556  WBC 4.5 4.6  NEUTROABS 2.9  --   HGB 11.7* 12.2  HCT 34.5* 37.0  MCV 95.3 93.9  PLT 131* 169   Cardiac Enzymes: No results for input(s): CKTOTAL, CKMB, CKMBINDEX, TROPONINI in the last 168 hours. BNP (last 3 results) No results for input(s): BNP in the last 8760 hours.  ProBNP (last 3 results) No results for input(s): PROBNP in the last 8760 hours.  CBG: No results for input(s): GLUCAP in the last 168 hours.  Recent Results (from the past 240 hour(s))  Culture, blood (routine x 2)     Status: None (Preliminary result)   Collection Time: 07/23/15  8:37 PM  Result Value Ref Range Status   Specimen Description BLOOD RIGHT ANTECUBITAL  Final   Special Requests BOTTLES DRAWN AEROBIC AND ANAEROBIC 6CC  Final   Culture NO GROWTH 2 DAYS  Final   Report Status PENDING  Incomplete  Culture, blood (routine x 2)     Status: None (Preliminary result)   Collection Time: 07/23/15  8:50 PM  Result Value Ref Range Status   Specimen Description BLOOD LEFT ANTECUBITAL  Final   Special Requests BOTTLES DRAWN AEROBIC AND ANAEROBIC 6CC  Final   Culture NO GROWTH 2 DAYS  Final   Report Status PENDING  Incomplete  MRSA PCR Screening     Status: Abnormal   Collection Time: 07/24/15  1:20 AM  Result Value Ref Range Status   MRSA by PCR  POSITIVE (A) NEGATIVE Final    Comment:        The GeneXpert MRSA Assay (FDA approved for NASAL specimens only), is one component of a comprehensive MRSA colonization surveillance program. It is not intended to diagnose MRSA infection nor to guide or monitor treatment for MRSA infections. RESULT CALLED TO, READ BACK BY AND VERIFIED WITH: M ENNIS,RN_0  07/24/15 MKELLY   Acid Fast Smear (AFB)     Status: None   Collection Time: 07/25/15 10:08 AM  Result Value Ref Range Status   AFB Specimen Processing Concentration  Final   Acid Fast Smear Negative  Final    Comment: (NOTE) Performed At: [facility] [address] [place], [place] [id number] Lindon Romp MD [id number]    Source (AFB) ABSCESS  Final    Comment: DISC L1 L2  Culture, routine-abscess     Status: None (Preliminary result)   Collection Time: 07/25/15 10:08 AM  Result Value Ref Range Status   Specimen Description ABSCESS  Final   Special Requests DISC L1 L2  Final   Gram Stain   Final    MODERATE WBC PRESENT,BOTH PMN AND MONONUCLEAR NO SQUAMOUS EPITHELIAL CELLS SEEN NO ORGANISMS SEEN Performed at [facility]    Culture   Final    NO GROWTH 1 DAY Performed at [facility]    Report Status PENDING  Incomplete     Studies: No results found.  Scheduled Meds: . aspirin EC  81 mg Oral Daily  . ceFEPime (MAXIPIME) IV  2 g Intravenous Q24H  . enoxaparin (LOVENOX) injection  40 mg Subcutaneous Q24H  . oxyCODONE  10 mg Oral Q12H  . rOPINIRole  2 mg Oral TID  . senna-docusate  1 tablet Oral QHS  . vancomycin  750 mg Intravenous Q12H   Continuous Infusions: . sodium chloride 75 mL/hr at 07/26/15 0653      Time spent: 25 minutes    Ramel Tobon, [place]  [facility] Pager [phone]. If 7PM-7AM, please contact night-coverage at [email or web address], password [facility] 07/26/2015, 11:32 AM  LOS: 3 days

## 2015-07-27 LAB — CULTURE, ROUTINE-ABSCESS: Culture: NO GROWTH

## 2015-07-27 MED ORDER — SODIUM CHLORIDE 0.9% FLUSH
10.0000 mL | INTRAVENOUS | Status: DC | PRN
  Administered 2015-07-29: 10 mL
  Filled 2015-07-27: qty 40

## 2015-07-27 MED ORDER — MANAGING BACK PAIN BOOK
Freq: Once | Status: DC
Start: 2015-07-27 — End: 2015-07-29
  Filled 2015-07-27: qty 1

## 2015-07-27 MED ORDER — OXYCODONE HCL ER 15 MG PO T12A
15.0000 mg | EXTENDED_RELEASE_TABLET | Freq: Two times a day (BID) | ORAL | Status: DC
  Administered 2015-07-27 – 2015-07-28 (×4): 15 mg via ORAL
  Filled 2015-07-27 (×4): qty 1

## 2015-07-27 NOTE — Clinical Social Work Placement (Signed)
   CLINICAL SOCIAL WORK PLACEMENT  NOTE  Date:  07/27/2015  Patient Details  Name: KAIRAH BRINKMEYER MRN: [id number] Date of Birth: 02-13-1935  Clinical Social Work is seeking post-discharge placement for this patient at the [place] level of care (*CSW will initial, date and re-position this form in  chart as items are completed):  Yes   Patient/family provided with [place] Work Department's list of facilities offering this level of care within the geographic area requested by the patient (or if unable, by the patient's family).  Yes   Patient/family informed of their freedom to choose among providers that offer the needed level of care, that participate in Medicare, Medicaid or managed care program needed by the patient, have an available bed and are willing to accept the patient.  Yes   Patient/family informed of [facility]'s ownership interest in [facility] and [facility], as well as of the fact that they are under no obligation to receive care at these facilities.  PASRR submitted to EDS on       PASRR number received on       Existing PASRR number confirmed on       FL2 transmitted to all facilities in geographic area requested by pt/family on       FL2 transmitted to all facilities within larger geographic area on       Patient informed that his/her managed care company has contracts with or will negotiate with certain facilities, including the following:            Patient/family informed of bed offers received.  Patient chooses bed at       Physician recommends and patient chooses bed at      Patient to be transferred to   on  .  Patient to be transferred to facility by       Patient family notified on   of transfer.  Name of family member notified:        PHYSICIAN       Additional Comment:    _______________________________________________ Rozell Searing, LCSW 07/27/2015, 4:23 PM

## 2015-07-27 NOTE — Progress Notes (Signed)
[facility] PROGRESS NOTE  ARYEL EDELEN ZJI:[id number] DOB: 19-Apr-1935 DOA: 07/23/2015 PCP: Walker Kehr, MD   Brief narrative 81 year old with history of rheumatoid arthritis insufficiency, GERD presented with one-week history of progressive pain in her lower back, sharp shooting down to her legs. Denies any trauma, tingling or numbness, weakness of her extremities or bowel or urinary incontinence. (Has chronic urinary frequency which is unchanged and has been constipated for past 4 days). Denies any recent illness, fevers, chills, headache, blurred vision. On presentation a CT scan of the lumbar spine was done which showed possible discitis around L1-L2 concerning for a semi-lipase with right paraspinal muscle mass. An MRI of the lumbar spine was done which showed osteomyelitis discitis over L1-L2 intervertebral disc space with associated epidural and paraspinous phlegmon. Possible small epidural abscess posterior to the L2 vertebral body also seen and a probable small 3 mg abscess within the right psoas muscle. There is moderate Stenosis at the level of L1-L2. Also seen abnormal edema within the L2-L3 disc likely reflecting a developing discitis. Has severe canal stenosis over L4-L5 and multilevel degenerative spondylosis and facet arthrosis. Patient received a dose of IV vancomycin and cefepime. Admitted to hospitalist service.   Assessment/Plan: Acute lumbar discitis/osteomyelitis with possible epidural and psoas abscess Seen by neurosurgery who recommended IR biopsy of the L1-L2 space.  ID following . -atient underwent fluoroscopy guided L1-L2 disc aspiration with 2.5 mL of thick bloody aspirate by IR. Sent for cultures including AFB and fungal cultures. Elevated ESR and CRP. - resumed empiric vancomycin and cefepime.  Cultures so far with no growth. ID recommends minimum 6 weeks of antibiotics (25/08/2015). Added Diflucan for thrush prophylaxis. -Ordered PICC line for long-term  antibiotics.  2-D echo done given systolic murmur and history of bioprosthetic valve shows normal EF with stable aortic valve. Localized aortic arch aneurysm measuring 49 mm. Blood cultures negative.  Pain control as below. PT/OT evaluation. Needs skilled Nursing facility.   Severe low back pain  Dilaudid 0.5 mg every 4 hours as needed. Percocet every 4 hours as needed. Increase OxyContin dose to 15 mg 12 hours. Robaxin every 6 hours as needed for muscle spasms.   Rheumatoid arthritis Stable.  History of abdominal aortic aneurysm repair and aortic valve replacement. As per her last imaging of the abdominal aortic aneurysm measured 4.1 cm. 2-D echo shows aneurysm measuring 4.9 cm. Patient should follow-up with Dr. Cyndia Bent as outpatient.   CKD stage 3 Stable.  Chronic anemia Stable.  Constipation added stool softeners.   DVT prophylaxis: Subcutaneous Lovenox  Diet: Regular   Code Status: Full code  Family Communication:None at bedside  Disposition Plan: . Likely discharge to skilled nursing facility early next week.  Consultants: Infectious disease Neurosurgery IR   Procedures: CT lumbar spine MRI lumbar spine  2-D echo   Antibiotics: Vancomycin and cefepime 1 dose on 3/22 Vancomycin and cefepime 3/23--  HPI/Subjective: Seen and examined. Reports worsened back pain when attempting to get up from bed.  Objective: Filed Vitals:   07/27/15 0501 07/27/15 0848  BP: 113/49 133/40  Pulse: 62 57  Temp: 97.5 F (36.4 C) 98.4 F (36.9 C)  Resp: 16 16    Intake/Output Summary (Last 24 hours) at 07/27/15 1114 Last data filed at 07/27/15 0847  Gross per 24 hour  Intake    240 ml  Output      0 ml  Net    240 ml   Filed Weights   07/23/15 1448 07/23/15 2201  Weight: 73.483 kg (162 lb) 72.1 kg (158 lb 15.2 oz)    Exam:   General:  Not in distress  HEENT:  moist mucosa    chest: Clear bilaterally   CVS: Normal S1 and S2, systolic murmur 3/6, no   rub or gallop   GI: Soft, nondistended, nontender, bowel sounds present   Musculoskeletal: Warm, Limited mobility due to low back pain.  Data Reviewed: Basic Metabolic Panel:  Recent Labs Lab 07/23/15 1631 07/25/15 0556  NA 139 138  K 3.7 3.5  CL 102 100*  CO2 30 23  GLUCOSE 99 75  BUN 18 13  CREATININE 1.09* 0.99  CALCIUM 8.8* 8.9   Liver Function Tests: No results for input(s): AST, ALT, ALKPHOS, BILITOT, PROT, ALBUMIN in the last 168 hours. No results for input(s): LIPASE, AMYLASE in the last 168 hours. No results for input(s): AMMONIA in the last 168 hours. CBC:  Recent Labs Lab 07/23/15 1631 07/25/15 0556  WBC 4.5 4.6  NEUTROABS 2.9  --   HGB 11.7* 12.2  HCT 34.5* 37.0  MCV 95.3 93.9  PLT 131* 169   Cardiac Enzymes: No results for input(s): CKTOTAL, CKMB, CKMBINDEX, TROPONINI in the last 168 hours. BNP (last 3 results) No results for input(s): BNP in the last 8760 hours.  ProBNP (last 3 results) No results for input(s): PROBNP in the last 8760 hours.  CBG: No results for input(s): GLUCAP in the last 168 hours.  Recent Results (from the past 240 hour(s))  Culture, blood (routine x 2)     Status: None (Preliminary result)   Collection Time: 07/23/15  8:37 PM  Result Value Ref Range Status   Specimen Description BLOOD RIGHT ANTECUBITAL  Final   Special Requests BOTTLES DRAWN AEROBIC AND ANAEROBIC 6CC  Final   Culture NO GROWTH 4 DAYS  Final   Report Status PENDING  Incomplete  Culture, blood (routine x 2)     Status: None (Preliminary result)   Collection Time: 07/23/15  8:50 PM  Result Value Ref Range Status   Specimen Description BLOOD LEFT ANTECUBITAL  Final   Special Requests BOTTLES DRAWN AEROBIC AND ANAEROBIC 6CC  Final   Culture NO GROWTH 4 DAYS  Final   Report Status PENDING  Incomplete  MRSA PCR Screening     Status: Abnormal   Collection Time: 07/24/15  1:20 AM  Result Value Ref Range Status   MRSA by PCR POSITIVE (A) NEGATIVE Final     Comment:        The GeneXpert MRSA Assay (FDA approved for NASAL specimens only), is one component of a comprehensive MRSA colonization surveillance program. It is not intended to diagnose MRSA infection nor to guide or monitor treatment for MRSA infections. RESULT CALLED TO, READ BACK BY AND VERIFIED WITH: M ENNIS,RN'@0420'  07/24/15 MKELLY   Acid Fast Smear (AFB)     Status: None   Collection Time: 07/25/15 10:08 AM  Result Value Ref Range Status   AFB Specimen Processing Concentration  Final   Acid Fast Smear Negative  Final    Comment: (NOTE) Performed At: [facility] [place], [place] [id number] Lindon Romp MD [id number]    Source (AFB) ABSCESS  Final    Comment: DISC L1 L2  Culture, routine-abscess     Status: None   Collection Time: 07/25/15 10:08 AM  Result Value Ref Range Status   Specimen Description ABSCESS  Final   Special Requests DISC L1 L2  Final   Gram  Stain   Final    MODERATE WBC PRESENT,BOTH PMN AND MONONUCLEAR NO SQUAMOUS EPITHELIAL CELLS SEEN NO ORGANISMS SEEN Performed at [facility]    Culture   Final    NO GROWTH 2 DAYS Performed at [facility]    Report Status 07/27/2015 FINAL  Final     Studies: No results found.  Scheduled Meds: . aspirin EC  81 mg Oral Daily  . ceFEPime (MAXIPIME) IV  2 g Intravenous Q24H  . enoxaparin (LOVENOX) injection  40 mg Subcutaneous Q24H  . fluconazole  200 mg Oral Daily  . managing back pain book   Does not apply Once  . oxyCODONE  15 mg Oral Q12H  . rOPINIRole  2 mg Oral TID  . senna-docusate  1 tablet Oral QHS  . vancomycin  750 mg Intravenous Q12H   Continuous Infusions:      Time spent: 25 minutes    Manami Tutor, [place]  [facility] Pager [phone]. If 7PM-7AM, please contact night-coverage at [email or web address], password [place] 07/27/2015, 11:14 AM  LOS: 4 days

## 2015-07-27 NOTE — Clinical Social Work Note (Signed)
Clinical Social Work Assessment  Patient Details  Name: Diane Abbott MRN: [id number] Date of Birth: May 11, 1934  Date of referral:  07/26/15               Reason for consult:  Facility Placement, Discharge Planning                Permission sought to share information with:  Case Manager, Facility Sport and exercise psychologist Permission granted to share information::     Name::      [name] Quarry manager)  Agency::   (SNF's )  Relationship::   (Daughter )  Contact Information:   [phone])  Housing/Transportation Living arrangements for the past 2 months:  Single Family Home Source of Information:  Patient Patient Interpreter Needed:  None Criminal Activity/Legal Involvement Pertinent to Current Situation/Hospitalization:  No - Comment as needed Significant Relationships:  Adult Children Lives with:  Self Do you feel safe going back to the place where you live?  Yes Need for family participation in patient care:  No (Coment)  Care giving concerns:  Patient requiring SNF placement.    Social Worker assessment / plan:  Patient prefers [facility] and aware that if [facility] is not able to meet needs she/family will need to choose alternative facility.   Employment status:  Retired Nurse, adult PT Recommendations:  Not assessed at this time Information / Referral to community resources:  [place]  Patient/Family's Response to care:  Pt a/o x4. Patient and family agreeable to SNF placement.  Patient/Family's Understanding of and Emotional Response to Diagnosis, Current Treatment, and Prognosis:  Pt and family knowledgeable of medical work up.   Emotional Assessment Appearance:  Appears stated age Attitude/Demeanor/Rapport:   (Pleasant) Affect (typically observed):  Accepting, Appropriate, Pleasant Orientation:  Oriented to Situation, Oriented to  Time, Oriented to Place, Oriented to Self Alcohol / Substance use:  Not Applicable Psych  involvement (Current and /or in the community):  No (Comment)  Discharge Needs  Concerns to be addressed:  Care Coordination Readmission within the last 30 days:  No Current discharge risk:  Dependent with Mobility Barriers to Discharge:  Continued Medical Work up   [employer], MSW, LCSWA [phone] 07/27/2015 4:23 PM

## 2015-07-27 NOTE — Progress Notes (Signed)
Peripherally Inserted Central Catheter/Midline Placement  The IV Nurse has discussed with the patient and/or persons authorized to consent for the patient, the purpose of this procedure and the potential benefits and risks involved with this procedure.  The benefits include less needle sticks, lab draws from the catheter and patient may be discharged home with the catheter.  Risks include, but not limited to, infection, bleeding, blood clot (thrombus formation), and puncture of an artery; nerve damage and irregular heat beat.  Alternatives to this procedure were also discussed.  PICC/Midline Placement Documentation        Collan Schoenfeld, Nicolette Bang 07/27/2015, 1:31 PM

## 2015-07-27 NOTE — Progress Notes (Signed)
PT Cancellation Note  Patient Details Name: Diane Abbott MRN: [id number] DOB: [birth year]   Cancelled Treatment:    Reason Eval/Treat Not Completed: Pain limiting ability to participate.  Pt adamantly refusing PT at this time.  Pt indicates she wants to wait until the pain is gone, however PT provided education about need for mobility prior to waiting until the pain is gone.  Pt then indicating that PT only caused her daughter pain before her daughter's passing and that she does not want to go through it herself.  Will continue to check on pt and encourage participation.     Catarina Hartshorn, [place] 07/27/2015, 10:21 AM

## 2015-07-28 DIAGNOSIS — M5440 Lumbago with sciatica, unspecified side: Secondary | ICD-10-CM

## 2015-07-28 NOTE — NC FL2 (Signed)
[place] MEDICAID FL2 LEVEL OF CARE SCREENING TOOL     IDENTIFICATION  Patient Name: Diane Abbott Birthdate: Sep 18, 1934 Sex: female Admission Date (Current Location): 07/23/2015  [facility] and [place] Number:  Herbalist and Address:  [facility] [place] [address], [place], [place] [address]      Provider Number: [id number]  Attending Physician Name and Address:  Louellen Molder, MD  Relative Name and Phone Number:       Current Level of Care: Hospital Recommended Level of Care: [place] Prior Approval Number:    Date Approved/Denied:   PASRR Number: [id number]  Discharge Plan: SNF    Current Diagnoses: Patient Active Problem List   Diagnosis Date Noted  . Discitis of lumbosacral region   . Epidural abscess   . Discitis 07/23/2015  . Back pain 07/23/2015  . Chronic renal insufficiency, stage III (moderate) 02/28/2015  . Hammer toe, acquired 11/29/2014  . Actinic keratoses 01/25/2014  . Thrombocytopenia due to drugs 11/24/2013  . Knee pain, right anterior 07/25/2013  . Keloid scar 05/26/2013  . Atrophic vaginitis 03/23/2013  . History of pulmonary embolism 01/27/2013  . Cough 10/10/2012  . S/P ascending aortic replacement 10/04/2012  . S/P AVR (aortic valve replacement) 10/04/2012  . Aortic aneurysm, thoracic ([place]) 09/14/2012  . Aortic insufficiency 09/14/2012  . Hyperglycemia 07/27/2012  . Wound, open, forearm 03/21/2012  . Osteoarthritis of left shoulder 12/07/2011  . Preop exam for internal medicine 11/17/2011  . Bronchitis 09/17/2011  . Nausea 09/09/2011  . Breast lump in female 04/07/2011  . COPD (chronic obstructive pulmonary disease) ([place]) 03/12/2011  . Esophagitis, unspecified 01/09/2011  . Insomnia 11/04/2010  . HICCUPS 07/01/2010  . RESTLESS LEG SYNDROME 02/03/2010  . THRUSH 07/04/2009  . PRURITUS 05/16/2009  . PULMONARY EMBOLISM 09/14/2008  . CONSTIPATION 09/14/2008  . RUQ PAIN 09/14/2008   . LUQ PAIN 09/14/2008  . ABDOMINAL PAIN-EPIGASTRIC 09/14/2008  . Edema 01/09/2008  . Pneumonia, organism unspecified 07/13/2007  . Chest pain, unspecified 07/13/2007  . DVT 07/08/2007  . GERD 07/08/2007  . Irritable bowel syndrome 07/08/2007  . Diffuse connective tissue disease ([place]) 07/08/2007  . Myalgia and myositis 07/08/2007  . CYSTITIS 05/31/2007  . Depression 05/11/2007  . SOMNOLENCE 05/11/2007  . FATIGUE 05/11/2007  . B12 deficiency 03/10/2007  . Vitamin D deficiency 03/10/2007  . OSTEOARTHRITIS 03/10/2007  . OSTEOPENIA 03/10/2007  . VERTIGO 03/10/2007  . PULMONARY EMBOLISM, HX OF 03/10/2007  . Rheumatoid arthritis ([place]) 11/23/2006    Orientation RESPIRATION BLADDER Height & Weight     Time, Situation, Place, Self  Normal Continent Weight: 158 lb 15.2 oz (72.1 kg) Height:  5\' 2"  (157.5 cm)  BEHAVIORAL SYMPTOMS/MOOD NEUROLOGICAL BOWEL NUTRITION STATUS      Continent Diet (regular)  AMBULATORY STATUS COMMUNICATION OF NEEDS Skin   Limited Assist Verbally Normal                       Personal Care Assistance Level of Assistance  Dressing, Bathing Bathing Assistance: Limited assistance   Dressing Assistance: Limited assistance     Functional Limitations Info             SPECIAL CARE FACTORS FREQUENCY  PT (By licensed PT), OT (By licensed OT)     PT Frequency: daily  OT Frequency: daily             Contractures      Additional Factors Info  Code Status, Allergies, Isolation Precautions  Code Status Info: FULL Allergies Info: Codeine, Pramipexole Dihydrochloride, Rofecoxib, Arava, Clonazepam, Diclofenac Sodium, Venlafaxine, Alprazolam, Darvocet, Duloxetine, Gabapentin, Latex, Morphine And Related, Nortriptyline Hcl, Penicillins, Prednisone     Isolation Precautions Info: MRSA     Current Medications (07/28/2015):  This is the current hospital active medication list Current Facility-Administered Medications  Medication Dose Route Frequency  Provider Last Rate Last Dose  . aspirin EC tablet 81 mg  81 mg Oral Daily Rondell Charmayne Sheer, MD   81 mg at 07/27/15 1055  . ceFEPIme (MAXIPIME) 2 g in dextrose 5 % 50 mL IVPB  2 g Intravenous Q24H Lauren D Bajbus, RPH   2 g at 07/27/15 1239  . diazepam (VALIUM) tablet 2 mg  2 mg Oral Q12H PRN Norval Morton, MD   2 mg at 07/27/15 0617  . diphenhydrAMINE (BENADRYL) capsule 25 mg  25 mg Oral QHS PRN Norval Morton, MD      . enoxaparin (LOVENOX) injection 40 mg  40 mg Subcutaneous Q24H Norval Morton, MD   40 mg at 07/27/15 2100  . fluconazole (DIFLUCAN) tablet 200 mg  200 mg Oral Daily Thayer Headings, MD   200 mg at 07/27/15 1055  . HYDROmorphone (DILAUDID) injection 0.5 mg  0.5 mg Intravenous Q4H PRN Nishant Dhungel, MD      . ipratropium (ATROVENT) 0.06 % nasal spray 2 spray  2 spray Nasal PRN Norval Morton, MD      . managing back pain book   Does not apply Once Michaela Corner, RN      . methocarbamol (ROBAXIN) tablet 500 mg  500 mg Oral Q6H PRN Nishant Dhungel, MD   500 mg at 07/28/15 0125  . ondansetron (ZOFRAN) tablet 4 mg  4 mg Oral Q6H PRN Norval Morton, MD       Or  . ondansetron (ZOFRAN) injection 4 mg  4 mg Intravenous Q6H PRN Rondell A Tamala Julian, MD      . oxyCODONE (OXYCONTIN) 12 hr tablet 15 mg  15 mg Oral Q12H Nishant Dhungel, MD   15 mg at 07/27/15 2245  . oxyCODONE-acetaminophen (PERCOCET/ROXICET) 5-325 MG per tablet 2 tablet  2 tablet Oral Q4H PRN Louellen Molder, MD   2 tablet at 07/28/15 0539  . rOPINIRole (REQUIP) tablet 2 mg  2 mg Oral TID Norval Morton, MD   2 mg at 07/27/15 2245  . senna-docusate (Senokot-S) tablet 1 tablet  1 tablet Oral QHS Nishant Dhungel, MD   1 tablet at 07/27/15 2101  . sodium chloride flush (NS) 0.9 % injection 10-40 mL  10-40 mL Intracatheter PRN Nishant Dhungel, MD      . vancomycin (VANCOCIN) IVPB 750 mg/150 ml premix  750 mg Intravenous Q12H Lauren D Bajbus, RPH   750 mg at 07/28/15 0023     Discharge Medications: Please see discharge  summary for a list of discharge medications.  Relevant Imaging Results:  Relevant Lab Results:   Additional Information SSN: [id number]  Dulcy Fanny, LCSW

## 2015-07-28 NOTE — Progress Notes (Signed)
Pt was out of bed today, receptive to using walking with assistance to the bathroom.

## 2015-07-28 NOTE — Progress Notes (Addendum)
Pharmacy Antibiotic Note  Diane Abbott is a 81 y.o. female admitted on 07/23/2015 with discitis/osteo.  Pharmacy has been consulted for vancomycin and cefepime dosing.  Patient did receive antibiotics earlier in admission, but were then held pending biopsy- completed today in IR. Sample sent for culture. Plan is for an extended course of abx (minimum of 6 wks)  Plan: - Cont vanc  750mg  IV q12h. Goal trough 15-71mcg/mL - cefepime 2g IV q24h  Vanc trough in AM  Height: 5\' 2"  (157.5 cm) Weight: 158 lb 15.2 oz (72.1 kg) IBW/kg (Calculated) : 50.1  Temp (24hrs), Avg:98.7 F (37.1 C), Min:98.1 F (36.7 C), Max:98.8 F (37.1 C)   Recent Labs Lab 07/23/15 1631 07/25/15 0556  WBC 4.5 4.6  CREATININE 1.09* 0.99    Estimated Creatinine Clearance: 42.8 mL/min (by C-G formula based on Cr of 0.99).    Allergies  Allergen Reactions  . Codeine Other (See Comments)    Blood pressure drops; "I pass out."  . Pramipexole Dihydrochloride Nausea Only    sick, falling  . Rofecoxib Other (See Comments)    "sleep walks"  . Arava [Leflunomide] Swelling  . Clonazepam   . Diclofenac Sodium   . Venlafaxine   . Alprazolam Other (See Comments)    "Makes me mean"  . Darvocet [Propoxyphene N-Acetaminophen] Other (See Comments)    "makes my eyes dilate. Pt stated I can't see"  . Duloxetine Other (See Comments)    achy legs  . Gabapentin Other (See Comments)    headache  . Latex Itching and Dermatitis    When examined with latex gloves, burns and itches in contact areas per pt.  . Morphine And Related Other (See Comments)    Hallucinations   . Nortriptyline Hcl Other (See Comments)    Migraines   . Penicillins Rash    Has patient had a PCN reaction causing immediate rash, facial/tongue/throat swelling, SOB or lightheadedness with hypotension: Yes Has patient had a PCN reaction causing severe rash involving mucus membranes or skin necrosis: Yes Has patient had a PCN reaction that required  hospitalization No Has patient had a PCN reaction occurring within the last 10 years: Yes If all of the above answers are "NO", then may proceed with Cephalosporin use.   Can take Cephalosporins  . Prednisone Swelling    Just doesn't want to take it.    Antimicrobials this admission: Vancomycin 3/21 x1; 3/23>> Cefepime 3/21 x1; 3/23>>  Dose adjustments this admission: N/A  Microbiology results: 3/23 aspirate: neg 3/21 Bcx: ngtd 3/22 MRSA PCR: pos 3/23 AFB smear>>neg  Thank you for allowing pharmacy to be a part of this patient's care.  Onnie Boer, PharmD Pager: [phone] 07/28/2015 9:27 AM

## 2015-07-28 NOTE — Progress Notes (Signed)
[facility] PROGRESS NOTE  Diane Abbott FKC:[id number] DOB: 14-Jul-1934 DOA: 07/23/2015 PCP: Walker Kehr, MD   Brief narrative 81 year old with history of rheumatoid arthritis insufficiency, GERD presented with one-week history of progressive pain in her lower back, sharp shooting down to her legs. Denies any trauma, tingling or numbness, weakness of her extremities or bowel or urinary incontinence. (Has chronic urinary frequency which is unchanged and has been constipated for past 4 days). Denies any recent illness, fevers, chills, headache, blurred vision. On presentation a CT scan of the lumbar spine was done which showed possible discitis around L1-L2 concerning for a semi-lipase with right paraspinal muscle mass. An MRI of the lumbar spine was done which showed osteomyelitis discitis over L1-L2 intervertebral disc space with associated epidural and paraspinous phlegmon. Possible small epidural abscess posterior to the L2 vertebral body also seen and a probable small 3 mg abscess within the right psoas muscle. There is moderate Stenosis at the level of L1-L2. Also seen abnormal edema within the L2-L3 disc likely reflecting a developing discitis. Has severe canal stenosis over L4-L5 and multilevel degenerative spondylosis and facet arthrosis. Patient received a dose of IV vancomycin and cefepime. Admitted to hospitalist service.   Assessment/Plan: Acute lumbar discitis/osteomyelitis with possible epidural and psoas abscess Seen by neurosurgery who recommended IR biopsy of the L1-L2 space.  - underwent fluoroscopy guided L1-L2 disc aspiration with 2.5 mL of thick bloody aspirate by IR. No growth on culture so far.. Elevated ESR and CRP. - resumed empiric vancomycin and cefepime.  . ID recommends minimum 6 weeks of antibiotics (09/05/2015). Added Diflucan for thrush prophylaxis.  PICC line placed for long-term antibiotics.  2-D echo done given systolic murmur and history of bioprosthetic  valve shows normal EF with stable aortic valve. Localized aortic arch aneurysm measuring 49 mm. Pain control as below. Seen by PT/OT.  Needs skilled Nursing facility.  Patient initially was hesitant to follow-up with ID as outpatient stating she has a lot of distrust infectious disease providers when her daughter who had a disc infection several years ago was not treated with long-term antibiotics. -I had a long conversation with her this morning and patient is in agreement that she will follow up with ID physician here in [place] as outpatient. I will have her follow-up with Dr. Linus Salmons in 3-4 weeks.  Severe low back pain Pain controlled on current regimen. Dilaudid 0.5 mg every 4 hours as needed. Percocet every 4 hours as needed.  OxyContin dose to 15 mg 12 hours. Robaxin every 6 hours as needed for muscle spasms.   Rheumatoid arthritis Stable.  History of abdominal aortic aneurysm repair and aortic valve replacement. As per her last imaging of the abdominal aortic aneurysm measured 4.1 cm. 2-D echo shows aneurysm measuring 4.9 cm. Patient should follow-up with Dr. Cyndia Bent as outpatient.   CKD stage 3 Stable.  Chronic anemia Stable.  Constipation added stool softeners.   DVT prophylaxis: Subcutaneous Lovenox  Diet: Regular   Code Status: Full code  Family Communication:None at bedside  Disposition Plan: . He starts to skilled nursing facility tomorrow.  Consultants: Infectious disease Neurosurgery IR   Procedures: CT lumbar spine MRI lumbar spine  2-D echo   Antibiotics: Vancomycin and cefepime 1 dose on 3/22 Vancomycin and cefepime 3/23--  HPI/Subjective: Seen and examined. Back pain stable on current pain medication. Patient was able to sit up and walk to the bathroom.  Objective: Filed Vitals:   07/28/15 0540 07/28/15 1006  BP: 107/55 130/42  Pulse: 58 59  Temp: 98.8 F (37.1 C) 98.1 F (36.7 C)  Resp:  16    Intake/Output Summary (Last 24 hours)  at 07/28/15 1142 Last data filed at 07/28/15 1006  Gross per 24 hour  Intake    240 ml  Output      0 ml  Net    240 ml   Filed Weights   07/23/15 1448 07/23/15 2201  Weight: 73.483 kg (162 lb) 72.1 kg (158 lb 15.2 oz)    Exam:   General:  Not in distress  HEENT:  moist mucosa    chest: Clear bilaterally   CVS: Normal S1 and S2, systolic murmur 3/6, no  rub or gallop   GI: Soft, nondistended, nontender, bowel sounds present   Musculoskeletal: Warm, Improve mobility.  Data Reviewed: Basic Metabolic Panel:  Recent Labs Lab 07/23/15 1631 07/25/15 0556  NA 139 138  K 3.7 3.5  CL 102 100*  CO2 30 23  GLUCOSE 99 75  BUN 18 13  CREATININE 1.09* 0.99  CALCIUM 8.8* 8.9   Liver Function Tests: No results for input(s): AST, ALT, ALKPHOS, BILITOT, PROT, ALBUMIN in the last 168 hours. No results for input(s): LIPASE, AMYLASE in the last 168 hours. No results for input(s): AMMONIA in the last 168 hours. CBC:  Recent Labs Lab 07/23/15 1631 07/25/15 0556  WBC 4.5 4.6  NEUTROABS 2.9  --   HGB 11.7* 12.2  HCT 34.5* 37.0  MCV 95.3 93.9  PLT 131* 169   Cardiac Enzymes: No results for input(s): CKTOTAL, CKMB, CKMBINDEX, TROPONINI in the last 168 hours. BNP (last 3 results) No results for input(s): BNP in the last 8760 hours.  ProBNP (last 3 results) No results for input(s): PROBNP in the last 8760 hours.  CBG: No results for input(s): GLUCAP in the last 168 hours.  Recent Results (from the past 240 hour(s))  Culture, blood (routine x 2)     Status: None (Preliminary result)   Collection Time: 07/23/15  8:37 PM  Result Value Ref Range Status   Specimen Description BLOOD RIGHT ANTECUBITAL  Final   Special Requests BOTTLES DRAWN AEROBIC AND ANAEROBIC 6CC  Final   Culture NO GROWTH 4 DAYS  Final   Report Status PENDING  Incomplete  Culture, blood (routine x 2)     Status: None (Preliminary result)   Collection Time: 07/23/15  8:50 PM  Result Value Ref Range  Status   Specimen Description BLOOD LEFT ANTECUBITAL  Final   Special Requests BOTTLES DRAWN AEROBIC AND ANAEROBIC 6CC  Final   Culture NO GROWTH 4 DAYS  Final   Report Status PENDING  Incomplete  MRSA PCR Screening     Status: Abnormal   Collection Time: 07/24/15  1:20 AM  Result Value Ref Range Status   MRSA by PCR POSITIVE (A) NEGATIVE Final    Comment:        The GeneXpert MRSA Assay (FDA approved for NASAL specimens only), is one component of a comprehensive MRSA colonization surveillance program. It is not intended to diagnose MRSA infection nor to guide or monitor treatment for MRSA infections. RESULT CALLED TO, READ BACK BY AND VERIFIED WITH: M ENNIS,RN'@0420'  07/24/15 MKELLY   Acid Fast Smear (AFB)     Status: None   Collection Time: 07/25/15 10:08 AM  Result Value Ref Range Status   AFB Specimen Processing Concentration  Final   Acid Fast Smear Negative  Final    Comment: (NOTE) Performed At: [facility]  [place], [place] [id number] Lindon Romp MD DB:[id number]    Source (AFB) ABSCESS  Final    Comment: DISC L1 L2  Culture, routine-abscess     Status: None   Collection Time: 07/25/15 10:08 AM  Result Value Ref Range Status   Specimen Description ABSCESS  Final   Special Requests DISC L1 L2  Final   Gram Stain   Final    MODERATE WBC PRESENT,BOTH PMN AND MONONUCLEAR NO SQUAMOUS EPITHELIAL CELLS SEEN NO ORGANISMS SEEN Performed at [facility]    Culture   Final    NO GROWTH 2 DAYS Performed at [facility]    Report Status 07/27/2015 FINAL  Final  Fungus Culture With Stain     Status: None (Preliminary result)   Collection Time: 07/25/15 10:08 AM  Result Value Ref Range Status   Fungus Stain Final report  Final    Comment: (NOTE) Performed At: [facility] [place], [place] [id number] Lindon Romp MD [id number]    Fungus (Mycology) Culture PENDING  Incomplete   Fungal Source  ABSCESS  Final    Comment: DISC L1 L2  Fungus Culture Result     Status: None   Collection Time: 07/25/15 10:08 AM  Result Value Ref Range Status   Result 1 Comment  Final    Comment: (NOTE) KOH/Calcofluor preparation:  no fungus observed. Performed At: [facility] [place], [place] [id number] Lindon Romp MD KT:[id number]      Studies: No results found.  Scheduled Meds: . aspirin EC  81 mg Oral Daily  . ceFEPime (MAXIPIME) IV  2 g Intravenous Q24H  . enoxaparin (LOVENOX) injection  40 mg Subcutaneous Q24H  . fluconazole  200 mg Oral Daily  . managing back pain book   Does not apply Once  . oxyCODONE  15 mg Oral Q12H  . rOPINIRole  2 mg Oral TID  . senna-docusate  1 tablet Oral QHS  . vancomycin  750 mg Intravenous Q12H   Continuous Infusions:      Time spent: 25 minutes    Crystel Demarco, [place]  [facility] Pager [phone]. If 7PM-7AM, please contact night-coverage at [email or web address], password [facility] 07/28/2015, 11:42 AM  LOS: 5 days

## 2015-07-28 NOTE — Progress Notes (Signed)
Patient ID: Diane Abbott, female   DOB: January 05, 1935, 81 y.o.   MRN: [id number]         [facility]    Date of Admission:  07/23/2015   Total days of antibiotics 6         Lumbar aspirate and blood cultures are negative. I will continue vancomycin and cefepime.         Michel Bickers, MD [facility] [place] [facility] [phone] pager   [phone] cell 05/07/2015, 1:32 PM

## 2015-07-28 NOTE — Evaluation (Signed)
Physical Therapy Evaluation Patient Details Name: CARLETA BOURDO MRN: [id number] DOB: 1935/02/17 Today's Date: 07/28/2015   History of Present Illness  Ms. Zocchi is 81 year old female with a past medical history significant for rheumatoid arthritis, adrenal insufficiency, gerd; who presents with new onset complaints of back pain. Pt went to [facility] underwent CT revelaing possible discitis around L1-2 concerning for osteomyelitis and evidence of R paraspinal muscle mass. [facility]. MRI revealed, osteomyelistis discits around L1-2 and small epidural abscess around L2 and R psoas muscle and a disc bulge at L4-5.  Clinical Impression  Pt admitted with above. Pt very anxious regarding pain but did tolerate 100' of amb with RW this date. Pt to benefit from ST-SNF upon d/c to achieve safe mod I level of function for safe transition home, as pt was indep PTA.    Follow Up Recommendations SNF;Supervision/Assistance - 24 hour    Equipment Recommendations  None recommended by PT    Recommendations for Other Services       Precautions / Restrictions Precautions Precautions: Back (for pain management) Precaution Comments: educated on log roll technique and contracting abdominal muscles for pain management Restrictions Weight Bearing Restrictions: No      Mobility  Bed Mobility Overal bed mobility: Needs Assistance Bed Mobility: Sit to Sidelying         Sit to sidelying: Mod assist General bed mobility comments: assist for LE management back into bed, minA for rolling from sidelying to supine  Transfers Overall transfer level: Needs assistance Equipment used: Rolling walker (2 wheeled) Transfers: Sit to/from Stand Sit to Stand: Min assist         General transfer comment: v/c's to push up from arm rests of chair  Ambulation/Gait Ambulation/Gait assistance: Min assist Ambulation Distance (Feet): 100 Feet Assistive device: Rolling walker (2  wheeled) Gait Pattern/deviations: Step-through pattern;Decreased stride length Gait velocity: slow Gait velocity interpretation: Below normal speed for age/gender General Gait Details: dependent on UEs, v/c's to relax shoulders and contract abdominals to minimize pain. pt with short shuffled steps  Stairs            Wheelchair Mobility    Modified Rankin (Stroke Patients Only)       Balance Overall balance assessment: Needs assistance Sitting-balance support: Feet supported;Single extremity supported Sitting balance-Leahy Scale: Poor Sitting balance - Comments: pt unable to maintain balance without unilateral UE support   Standing balance support: Bilateral upper extremity supported Standing balance-Leahy Scale: Poor Standing balance comment: requires RW for support                             Pertinent Vitals/Pain Pain Assessment: 0-10 Pain Score: 6  Pain Location: back Pain Descriptors / Indicators: Sharp Pain Intervention(s): Monitored during session    Home Living Family/patient expects to be discharged to:: Skilled nursing facility                 Additional Comments: pt lives with dtr in 2 story home, ramped entry and handicapped bathrooms. can stay on first floor, has hospital bed. daughter unable to physically assist pt due to back issues herself.    Prior Function Level of Independence: Independent         Comments: was washing cars and cleaning organs a few days ago, doesn't use an AD at this time     Hand Dominance   Dominant Hand: Right    Extremity/Trunk Assessment  Upper Extremity Assessment: Overall WFL for tasks assessed           Lower Extremity Assessment: Overall WFL for tasks assessed (does have bilat foot neuropathy)      Cervical / Trunk Assessment:  (back pain)  Communication   Communication: No difficulties  Cognition Arousal/Alertness: Awake/alert Behavior During Therapy: Anxious Overall Cognitive  Status: Within Functional Limits for tasks assessed                      General Comments      Exercises        Assessment/Plan    PT Assessment Patient needs continued PT services  PT Diagnosis Difficulty walking;Generalized weakness;Acute pain   PT Problem List Decreased strength;Decreased activity tolerance;Decreased balance;Decreased mobility;Decreased coordination;Decreased safety awareness  PT Treatment Interventions DME instruction;Gait training;Stair training;Functional mobility training;Therapeutic activities;Therapeutic exercise;Balance training   PT Goals (Current goals can be found in the Care Plan section) Acute Rehab PT Goals Patient Stated Goal: rehab to be indep PT Goal Formulation: With patient Time For Goal Achievement: 08/04/15 Potential to Achieve Goals: Good    Frequency Min 5X/week   Barriers to discharge Decreased caregiver support daughter unable to physical assist    Co-evaluation               End of Session Equipment Utilized During Treatment: Gait belt Activity Tolerance: Patient tolerated treatment well Patient left: in bed;with call bell/phone within reach Nurse Communication: Mobility status (pt to be up for all meals and to use BSC)         Time: [id number] PT Time Calculation (min) (ACUTE ONLY): 31 min   Charges:   PT Evaluation $PT Eval Moderate Complexity: 1 Procedure PT Treatments $Gait Training: 8-22 mins   PT G CodesKingsley Callander 07/28/2015, 9:15 AM   Kittie Plater, PT, DPT Pager #: [phone] Office #: [phone]

## 2015-07-29 ENCOUNTER — Inpatient Hospital Stay
Admission: RE | Admit: 2015-07-29 | Discharge: 2015-08-09 | Disposition: A | Discharge status: Home / Self Care | Payer: Medicare Other | Source: Ambulatory Visit | Attending: *Deleted | Admitting: *Deleted

## 2015-07-29 ENCOUNTER — Encounter: Payer: Self-pay | Admitting: Internal Medicine

## 2015-07-29 DIAGNOSIS — K5909 Other constipation: Secondary | ICD-10-CM | POA: Diagnosis not present

## 2015-07-29 DIAGNOSIS — S22000D Wedge compression fracture of unspecified thoracic vertebra, subsequent encounter for fracture with routine healing: Secondary | ICD-10-CM

## 2015-07-29 DIAGNOSIS — M6281 Muscle weakness (generalized): Secondary | ICD-10-CM | POA: Diagnosis not present

## 2015-07-29 DIAGNOSIS — G2581 Restless legs syndrome: Secondary | ICD-10-CM | POA: Diagnosis not present

## 2015-07-29 DIAGNOSIS — Z95828 Presence of other vascular implants and grafts: Secondary | ICD-10-CM | POA: Diagnosis not present

## 2015-07-29 DIAGNOSIS — S29002A Unspecified injury of muscle and tendon of back wall of thorax, initial encounter: Secondary | ICD-10-CM | POA: Diagnosis not present

## 2015-07-29 DIAGNOSIS — Z86718 Personal history of other venous thrombosis and embolism: Secondary | ICD-10-CM | POA: Diagnosis not present

## 2015-07-29 DIAGNOSIS — G062 Extradural and subdural abscess, unspecified: Secondary | ICD-10-CM | POA: Diagnosis not present

## 2015-07-29 DIAGNOSIS — F329 Major depressive disorder, single episode, unspecified: Secondary | ICD-10-CM | POA: Diagnosis not present

## 2015-07-29 DIAGNOSIS — M797 Fibromyalgia: Secondary | ICD-10-CM | POA: Diagnosis not present

## 2015-07-29 DIAGNOSIS — N3090 Cystitis, unspecified without hematuria: Secondary | ICD-10-CM | POA: Diagnosis not present

## 2015-07-29 DIAGNOSIS — J189 Pneumonia, unspecified organism: Secondary | ICD-10-CM | POA: Diagnosis not present

## 2015-07-29 DIAGNOSIS — M4856XA Collapsed vertebra, not elsewhere classified, lumbar region, initial encounter for fracture: Secondary | ICD-10-CM | POA: Diagnosis not present

## 2015-07-29 DIAGNOSIS — M4854XD Collapsed vertebra, not elsewhere classified, thoracic region, subsequent encounter for fracture with routine healing: Secondary | ICD-10-CM | POA: Diagnosis not present

## 2015-07-29 DIAGNOSIS — M4647 Discitis, unspecified, lumbosacral region: Secondary | ICD-10-CM | POA: Diagnosis not present

## 2015-07-29 DIAGNOSIS — R278 Other lack of coordination: Secondary | ICD-10-CM | POA: Diagnosis not present

## 2015-07-29 DIAGNOSIS — M4626 Osteomyelitis of vertebra, lumbar region: Secondary | ICD-10-CM | POA: Diagnosis present

## 2015-07-29 DIAGNOSIS — S22000A Wedge compression fracture of unspecified thoracic vertebra, initial encounter for closed fracture: Secondary | ICD-10-CM | POA: Diagnosis present

## 2015-07-29 DIAGNOSIS — B379 Candidiasis, unspecified: Secondary | ICD-10-CM | POA: Diagnosis not present

## 2015-07-29 DIAGNOSIS — N183 Chronic kidney disease, stage 3 (moderate): Secondary | ICD-10-CM | POA: Diagnosis not present

## 2015-07-29 DIAGNOSIS — M069 Rheumatoid arthritis, unspecified: Secondary | ICD-10-CM | POA: Diagnosis not present

## 2015-07-29 DIAGNOSIS — K589 Irritable bowel syndrome without diarrhea: Secondary | ICD-10-CM | POA: Diagnosis not present

## 2015-07-29 DIAGNOSIS — K219 Gastro-esophageal reflux disease without esophagitis: Secondary | ICD-10-CM | POA: Diagnosis not present

## 2015-07-29 DIAGNOSIS — K582 Mixed irritable bowel syndrome: Secondary | ICD-10-CM | POA: Diagnosis not present

## 2015-07-29 DIAGNOSIS — M549 Dorsalgia, unspecified: Secondary | ICD-10-CM | POA: Diagnosis not present

## 2015-07-29 DIAGNOSIS — K5900 Constipation, unspecified: Secondary | ICD-10-CM | POA: Diagnosis not present

## 2015-07-29 DIAGNOSIS — L299 Pruritus, unspecified: Secondary | ICD-10-CM | POA: Diagnosis not present

## 2015-07-29 DIAGNOSIS — D519 Vitamin B12 deficiency anemia, unspecified: Secondary | ICD-10-CM | POA: Diagnosis not present

## 2015-07-29 DIAGNOSIS — E119 Type 2 diabetes mellitus without complications: Secondary | ICD-10-CM | POA: Diagnosis not present

## 2015-07-29 DIAGNOSIS — G8929 Other chronic pain: Secondary | ICD-10-CM | POA: Diagnosis not present

## 2015-07-29 DIAGNOSIS — M1990 Unspecified osteoarthritis, unspecified site: Secondary | ICD-10-CM | POA: Diagnosis not present

## 2015-07-29 DIAGNOSIS — E559 Vitamin D deficiency, unspecified: Secondary | ICD-10-CM | POA: Diagnosis not present

## 2015-07-29 DIAGNOSIS — Z96653 Presence of artificial knee joint, bilateral: Secondary | ICD-10-CM | POA: Diagnosis not present

## 2015-07-29 DIAGNOSIS — R262 Difficulty in walking, not elsewhere classified: Secondary | ICD-10-CM | POA: Diagnosis not present

## 2015-07-29 LAB — CULTURE, BLOOD (ROUTINE X 2)
CULTURE: NO GROWTH
CULTURE: NO GROWTH

## 2015-07-29 LAB — BASIC METABOLIC PANEL
ANION GAP: 9 (ref 5–15)
BUN: 15 mg/dL (ref 6–20)
CALCIUM: 9.5 mg/dL (ref 8.9–10.3)
CO2: 28 mmol/L (ref 22–32)
Chloride: 102 mmol/L (ref 101–111)
Creatinine, Ser: 0.9 mg/dL (ref 0.44–1.00)
GFR, EST NON AFRICAN AMERICAN: 59 mL/min — AB (ref >60)
Glucose, Bld: 103 mg/dL — ABNORMAL HIGH (ref 65–99)
POTASSIUM: 4 mmol/L (ref 3.5–5.1)
SODIUM: 139 mmol/L (ref 135–145)

## 2015-07-29 LAB — VANCOMYCIN, TROUGH: VANCOMYCIN TR: 30 ug/mL — AB (ref 10.0–20.0)

## 2015-07-29 MED ORDER — OXYCODONE-ACETAMINOPHEN 5-325 MG PO TABS: 2.0000 | ORAL_TABLET | ORAL | Status: DC | PRN

## 2015-07-29 MED ORDER — VANCOMYCIN HCL IN DEXTROSE 750-5 MG/150ML-% IV SOLN: 750.0000 mg | Freq: Two times a day (BID) | INTRAVENOUS | Status: DC

## 2015-07-29 MED ORDER — SENNOSIDES-DOCUSATE SODIUM 8.6-50 MG PO TABS: 1.0000 | ORAL_TABLET | Freq: Every day | ORAL | Status: DC

## 2015-07-29 MED ORDER — OXYCODONE HCL ER 15 MG PO T12A: 15.0000 mg | EXTENDED_RELEASE_TABLET | Freq: Two times a day (BID) | ORAL | Status: DC

## 2015-07-29 MED ORDER — DEXTROSE 5 % IV SOLN: 2.0000 g | INTRAVENOUS | Status: AC

## 2015-07-29 MED ORDER — FLUCONAZOLE 200 MG PO TABS: 200.0000 mg | ORAL_TABLET | Freq: Every day | ORAL | Status: DC

## 2015-07-29 MED ORDER — METHOCARBAMOL 500 MG PO TABS: 500.0000 mg | ORAL_TABLET | Freq: Four times a day (QID) | ORAL | Status: DC | PRN

## 2015-07-29 MED ORDER — DIAZEPAM 2 MG PO TABS: 2.0000 mg | ORAL_TABLET | Freq: Two times a day (BID) | ORAL | Status: DC | PRN

## 2015-07-29 MED ORDER — DIPHENHYDRAMINE HCL (SLEEP) 25 MG PO TABS: 25.0000 mg | ORAL_TABLET | Freq: Every evening | ORAL | Status: DC | PRN

## 2015-07-29 MED ORDER — VANCOMYCIN HCL IN DEXTROSE 1-5 GM/200ML-% IV SOLN: 1000.0000 mg | INTRAVENOUS | Status: DC

## 2015-07-29 NOTE — Discharge Summary (Addendum)
Physician Discharge Summary  Diane Abbott ACZ:[id number] DOB: 1934/09/08 DOA: 07/23/2015  PCP: Walker Kehr, MD  Admit date: 07/23/2015 Discharge date: 07/29/2015  Time spent:35  minutes  Recommendations for Outpatient Follow-up:  1. Discharged to skilled nursing facility for ongoing rehabilitation . Patient is being discharged on IV vancomycin and cefepime for 6 weeks antibiotic course. (Stop date 09/05/2015). Please obtain weekly CBC and Comprehensive metabolic panel was she is on antibiotics. 2. Follow-up with ID Dr Linus Salmons in 4 weeks. 3. Patient should follow-up with her cardiothoracic surgeon Dr. Cyndia Bent in 6 weeks (after completion of antibiotics and possibly discharged from rehabilitation, regarding her thoracic aortic aneurysm)   Discharge Diagnoses:  Principal Problem:   Acute osteomyelitis of lumbar spine ([place])   Active Problems:   Discitis of lumbosacral region   Epidural abscess   Low back pain   RESTLESS LEG SYNDROME   Rheumatoid arthritis ([place])   S/P ascending aortic replacement   Chronic Thoracic compression fracture ([facility])   CKD (chronic kidney disease) stage 3, GFR 30-59 ml/min   Discharge Condition: Fair  Diet recommendation: Regular  CODE STATUS: Full code  Filed Weights   07/23/15 1448 07/23/15 2201  Weight: 73.483 kg (162 lb) 72.1 kg (158 lb 15.2 oz)    History of present illness:  81 year old with history of rheumatoid arthritis insufficiency, GERD presented with one-week history of progressive pain in her lower back, sharp shooting down to her legs. Denies any trauma, tingling or numbness, weakness of her extremities or bowel or urinary incontinence. (Has chronic urinary frequency which is unchanged and has been constipated for past 4 days). Denies any recent illness, fevers, chills, headache, blurred vision. On presentation a CT scan of the lumbar spine was done which showed possible discitis around L1-L2 concerning for a semi-lipase with right  paraspinal muscle mass. An MRI of the lumbar spine was done which showed osteomyelitis discitis over L1-L2 intervertebral disc space with associated epidural and paraspinous phlegmon. Possible small epidural abscess posterior to the L2 vertebral body also seen and a probable small 3 mg abscess within the right psoas muscle. There is moderate Stenosis at the level of L1-L2. Also seen abnormal edema within the L2-L3 disc likely reflecting a developing discitis. Has severe canal stenosis over L4-L5 and multilevel degenerative spondylosis and facet arthrosis. Patient received a dose of IV vancomycin and cefepime. Admitted to hospitalist service.  Hospital Course:  Acute lumbar discitis/osteomyelitis with possible epidural and psoas abscess -Seen by neurosurgery who recommended IR biopsy of the L1-L2 space.  - underwent fluoroscopy guided L1-L2 disc aspiration with 2.5 mL of thick bloody aspirate by IR. No growth on culture so far.. Elevated ESR and CRP. - resumed empiric vancomycin and cefepime. . ID recommends minimum 6 weeks of antibiotics (until 09/05/2015). Added Diflucan for thrush prophylaxis. -PICC line placed for long-term antibiotics.  -2-D echo done given systolic murmur and history of bioprosthetic valve shows normal EF with stable aortic valve. Localized aortic arch aneurysm measuring 49 mm. Pain control as below. Seen by PT/OT. Needs skilled Nursing facility.  Patient initially was hesitant to follow-up with ID as outpatient stating she has a lot of distrust infectious disease providers when her daughter who had a disc infection several years ago was not treated with long-term antibiotics. -I had a long conversation with her this morning and patient is in agreement that she will follow up with ID physician here in [place] as outpatient. I will have her follow-up with Dr. Linus Salmons in 3-4 weeks.  Severe low back pain Pain controlled on current regimen. Percocet 10-325 mg every 4 hours as  needed. OxyContin dose increased to 15 mg 12 hours. Robaxin every 6 hours as needed for muscle spasms. (Patient was on OxyContin 10 mg every 12 hours for her back pain prior to admission).   Rheumatoid arthritis Stable.  History of abdominal aortic aneurysm repair and aortic valve replacement. As per her last imaging of the abdominal aortic aneurysm measured 4.1 cm. 2-D echo shows aneurysm measuring 4.9 cm. Patient should follow-up with Dr. Cyndia Bent as outpatient.   CKD stage 3 Stable.  Chronic anemia Stable.  Constipation added stool softeners.  Chronic anxiety Continue Valium twice a day when necessary    Family Communication:None at bedside  Disposition Plan: .  skilled nursing facility   Consultants: Infectious disease Neurosurgery IR   Procedures: CT lumbar spine MRI lumbar spine  2-D echo   Antibiotics: Vancomycin and cefepime 1 dose on 3/22 Vancomycin and cefepime 3/23--until 5 /08/2015   Discharge Exam: Filed Vitals:   07/29/15 0427 07/29/15 1000  BP: 124/37 120/67  Pulse: 79 78  Temp: 98.4 F (36.9 C) 98.2 F (36.8 C)  Resp: 16 18     General: Elderly female not in distress  HEENT: No pallor, moist mucosa, supple neck  chest: Clear bilaterally  CVS: Normal S1 and S2, systolic murmur 3/6, no rub or gallop   GI: Soft, nondistended, nontender, bowel sounds present   Musculoskeletal: Warm, Improve mobility.  CNS: Alert and oriented, nonfocal  Discharge Instructions    Current Discharge Medication List    START taking these medications   Details  ceFEPIme 2 g in dextrose 5 % 50 mL Inject 2 g into the vein daily. Qty: 38 Dose, Refills: 0    fluconazole (DIFLUCAN) 200 MG tablet Take 1 tablet (200 mg total) by mouth daily. Qty: 35 tablet, Refills: 0    methocarbamol (ROBAXIN) 500 MG tablet Take 1 tablet (500 mg total) by mouth every 6 (six) hours as needed for muscle spasms. Qty: 30 tablet, Refills: 0     oxyCODONE-acetaminophen (PERCOCET/ROXICET) 5-325 MG tablet Take 2 tablets by mouth every 4 (four) hours as needed for moderate pain. Qty: 30 tablet, Refills: 0    senna-docusate (SENOKOT-S) 8.6-50 MG tablet Take 1 tablet by mouth at bedtime. Qty: 10 tablet, Refills: 0    Vancomycin (VANCOCIN) 750 MG/150ML SOLN Inject 150 mLs (750 mg total) into the vein every 12 (twelve) hours. Qty: 4000 mL, Refills: 0      CONTINUE these medications which have CHANGED   Details  diazepam (VALIUM) 2 MG tablet Take 1 tablet (2 mg total) by mouth every 12 (twelve) hours as needed for anxiety. Qty: 20 tablet, Refills: 0    diphenhydrAMINE (SOMINEX) 25 MG tablet Take 1 tablet (25 mg total) by mouth at bedtime as needed for itching. Reported on 06/03/2015 Qty: 15 tablet, Refills: 0    oxyCODONE (OXYCONTIN) 15 mg 12 hr tablet Take 1 tablet (15 mg total) by mouth every 12 (twelve) hours. Please fill on or after 06/01/15 Qty: 30 tablet, Refills: 0      CONTINUE these medications which have NOT CHANGED   Details  Ascorbic Acid (VITAMIN C) 1000 MG tablet Take 6,000 mg by mouth daily.    aspirin EC 81 MG EC tablet Take 1 tablet (81 mg total) by mouth daily.    Cholecalciferol 5000 units TABS Take 1 tablet by mouth daily.    Collagen 500 MG CAPS Take  1 capsule by mouth daily.    CYANOCOBALAMIN PO Take 1 tablet by mouth daily.     Estradiol (VAGIFEM) 10 MCG TABS vaginal tablet INSERT ONE TABLET VAGINALLY EVERY 2 (TWO) DAYS Qty: 30 tablet, Refills: 5    [place] Berries 565 MG CAPS Take by mouth daily.    ipratropium (ATROVENT) 0.06 % nasal spray Place 1-2 sprays into the nose as needed. For runny nose Qty: 15 mL, Refills: 3    meclizine (ANTIVERT) 12.5 MG tablet TAKE ONE TO TWO TABLETS BY MOUTH THREE TIMES DAILY AS NEEDED FOR DIZZINESS Qty: 60 tablet, Refills: 0    niacin 500 MG tablet Take 500 mg by mouth 2 (two) times daily with a meal.    rOPINIRole (REQUIP) 2 MG tablet TAKE ONE TABLET BY MOUTH  THREE TIMES DAILY Qty: 90 tablet, Refills: 5    vitamin E 400 UNIT capsule Take 800 Units by mouth once a week.      STOP taking these medications     NEXIUM 40 MG capsule        Allergies  Allergen Reactions  . Codeine Other (See Comments)    Blood pressure drops; "I pass out."  . Pramipexole Dihydrochloride Nausea Only    sick, falling  . Rofecoxib Other (See Comments)    "sleep walks"  . Arava [Leflunomide] Swelling  . Clonazepam   . Diclofenac Sodium   . Venlafaxine   . Alprazolam Other (See Comments)    "Makes me mean"  . Darvocet [Propoxyphene N-Acetaminophen] Other (See Comments)    "makes my eyes dilate. Pt stated I can't see"  . Duloxetine Other (See Comments)    achy legs  . Gabapentin Other (See Comments)    headache  . Latex Itching and Dermatitis    When examined with latex gloves, burns and itches in contact areas per pt.  . Morphine And Related Other (See Comments)    Hallucinations   . Nortriptyline Hcl Other (See Comments)    Migraines   . Penicillins Rash    Has patient had a PCN reaction causing immediate rash, facial/tongue/throat swelling, SOB or lightheadedness with hypotension: Yes Has patient had a PCN reaction causing severe rash involving mucus membranes or skin necrosis: Yes Has patient had a PCN reaction that required hospitalization No Has patient had a PCN reaction occurring within the last 10 years: Yes If all of the above answers are "NO", then may proceed with Cephalosporin use.   Can take Cephalosporins  . Prednisone Swelling    Just doesn't want to take it.   Follow-up Information    Please follow up.   Why:  MD at SNF in 1 week      Follow up with Scharlene Gloss, MD. Schedule an appointment as soon as possible for a visit in 4 weeks.   Specialty:  Infectious Diseases   Contact information:   [address] [address] [phone]       Follow up with Gaye Pollack, MD. Schedule an appointment as  soon as possible for a visit in 6 weeks.   Specialty:  Cardiothoracic Surgery   Contact information:   [address] [place] [address] [phone]        The results of significant diagnostics from this hospitalization (including imaging, microbiology, ancillary and laboratory) are listed below for reference.    Significant Diagnostic Studies: Dg Thoracic Spine 2 View  07/23/2015  CLINICAL DATA:  Low back injury EXAM:  THORACIC SPINE 2 VIEWS COMPARISON:  03/25/2015 FINDINGS: T3 and L1 compression fractures are stable. Osteopenia. No new fracture. Degenerative changes and slight dextroscoliosis are stable. IMPRESSION: Stable T3 and L1 compression fractures.  No acute bony pathology. Electronically Signed   By: Marybelle Killings M.D.   On: 07/23/2015 17:24   Ct Lumbar Spine Wo Contrast  07/23/2015  CLINICAL DATA:  Cleaned organ at church and clean/waxed daughter car about 7 days ago. Pt has been in bed for last 6-7 days, using depends because she has not been able to walk. EXAM: CT LUMBAR SPINE WITHOUT CONTRAST TECHNIQUE: Multidetector CT imaging of the lumbar spine was performed without intravenous contrast administration. Multiplanar CT image reconstructions were also generated. COMPARISON:  08/24/2012 FINDINGS: Progressive compression deformity L1 vertebral body, with loss of about 75%. Interval progressive sclerosis of L1, with loss of inferior endplate cortex. Also, loss of superior endplate cortex of L2, with sclerosis beneath the superior L2 endplate. There is erosive change on both sides of the L1-2 disk. There is increased attenuation in the paraspinous soft tissues at the L1-2 level with evidence of a right paraspinous mass. Again seen is retropulsion of L1 posterior cortex, causing mild canal narrowing. L3, L4, and L5 vertebral bodies stable. L1-2: Disc bulge and moderate facet hypertrophy causes moderate canal narrowing. L2-3: There is vacuum phenomenon in the disc. There is  significant disc bulge with facet hypertrophy. There is significant canal and foraminal narrowing bilaterally. L3-4: Moderate facet hypertrophy. Mild disc bulge. Mild to moderate canal narrowing. Mild foraminal narrowing. L4-5: Severe facet hypertrophy. Moderate disc bulge. Calcification of the ligamentum flavum. Severe canal narrowing. Moderate foraminal narrowing. L5-S1: Severe facet hypertrophy. Mild to moderate disc bulge. Mild canal narrowing. Mild foraminal narrowing bilaterally. Moderate calcification of the aortoiliac vessels. IMPRESSION: In addition to significant canal and foraminal narrowing throughout the lumbar spine due to disk and facet degenerative changes, there is further progressive compressive deformity of L1, and there is evidence of diskitis involving the L1-2 disk with changes in the adjoining endplates concerning for osteomyelitis. A right L1-2 paraspinous soft tissue mass is likely related to inflammation. MRI of the lumbar spine without and with contrast suggested to evaluate further. Electronically Signed   By: Skipper Cliche M.D.   On: 07/23/2015 17:39   Mr Lumbar Spine W Wo Contrast  07/24/2015  CLINICAL DATA:  Initial evaluation for possible osteomyelitis discitis. EXAM: MRI LUMBAR SPINE WITHOUT AND WITH CONTRAST TECHNIQUE: Multiplanar and multiecho pulse sequences of the lumbar spine were obtained without and with intravenous contrast. CONTRAST:  57m MULTIHANCE GADOBENATE DIMEGLUMINE 529 MG/ML IV SOLN COMPARISON:  Prior CT from 07/23/2015. FINDINGS: Mild levoscoliosis with apex at L1-2 present. 3 mm anterolisthesis of L4 on L5. Vertebral bodies otherwise normally aligned with preservation of the normal lumbar lordosis. Chronic anterior height loss at the L1 vertebral body of approximately 50%. No significant bony retropulsion. Vertebral body heights otherwise maintained. Benign hemangioma noted within the T12 vertebral body. Signal intensity within the vertebral body bone marrow  fairly patchy and heterogeneous. There is abnormal T1 hypo intense, T2/STIR hyperintense signal intensity within the L1 and L2 vertebral bodies, centered about the L1-2 disc space. Associated postcontrast enhancement. There is abnormal edema and fluid signal intensity within the L1-2 disc itself. Findings consistent with osteomyelitis discitis. Associated phlegmonous change within the adjacent psoas muscle on the right. Possible tiny 3 mm psoas abscess noted (series 10, image 10). Edema within the adjacent L2-3 disc also concerning for possible discitis. There is  a 14 mm T2 hyperintense, T1 hypo intense, enhancing lesion at the right posterior margin of the L1-2 interspace (Series 4, image 7). Are unclear whether this reflects a small abscess or possibly disc material. Abnormal enhancement within the ventral epidural space with probable small amount of epidural abscess posterior to the L2 vertebral body (series 10, image 12). Associated epidural enhancement within the posterior epidural space at the level of L1 it and L2 as well. Inflammatory signal changes are relatively confined to the L1 and L2 levels at this point, with no other significant inflammatory changes within the visualized spine. Conus medullaris terminates normally at the L1-2 level. Signal intensity within the visualized cord is normal. Nerve roots of the cauda equina within normal limits. Other than the changes related to the osteomyelitis discitis, paraspinous soft tissues within normal limits. T11-12: Seen only on sagittal projection. Disc desiccation without disc bulge. No stenosis. T12-L1: Degenerative disc desiccation with minimal disc bulge. Bilateral facet arthrosis. Mild canal and bilateral foraminal narrowing. L1-2: Changes related to osteomyelitis discitis as above. Moderate canal stenosis. Superimposed moderate to advanced bilateral facet arthropathy present. Mild to moderate bilateral foraminal narrowing, worse on the right. L2-3:  Diffuse degenerative disc bulge with disc desiccation. Possible changes related early discitis at this level noted. Mild epidural enhancement without significant epidural collection. Prominent bilateral facet arthrosis with ligamentum flavum thickening. Moderate to severe canal stenosis. Mild bilateral foraminal narrowing. L3-4: No significant disc bulge or disc protrusion. Bilateral facet arthrosis. No significant canal or foraminal narrowing. L4-5: 3 mm anterolisthesis of L4 on L5. Associated disc bulge with severe bilateral facet arthrosis. Resultant severe canal stenosis with the thecal sac measuring 6 mm in AP diameter. Asymmetric fluid within the left L4-5 facet. Mild bilateral foraminal narrowing. L5-S1: Degenerative disc desiccation with intervertebral disc space narrowing and mild disc bulge. Bilateral facet arthrosis. No significant canal or foraminal stenosis. IMPRESSION: 1. Findings consistent with osteomyelitis discitis centered about the L1-2 intervertebral disc space, with associated epidural and paraspinous phlegmon as above. Possible small epidural abscess posterior to the L2 vertebral body as above. Probable small 3 mm abscess within the right psoas muscle. Resultant moderate canal stenosis at the L1-2 level. 2. Abnormal edema within the L2-3 disc as well, likely reflecting developing discitis. 3. 3 mm anterolisthesis of L4 on L5 with associated disc bulge and facet disease, resulting in severe canal stenosis. 4. Additional moderate multilevel degenerative spondylolysis and facet arthrosis as above. Please see the above report for a full description these findings. Electronically Signed   By: Jeannine Boga M.D.   On: 07/24/2015 05:25    Microbiology: Recent Results (from the past 240 hour(s))  Culture, blood (routine x 2)     Status: None   Collection Time: 07/23/15  8:37 PM  Result Value Ref Range Status   Specimen Description BLOOD RIGHT ANTECUBITAL  Final   Special Requests  BOTTLES DRAWN AEROBIC AND ANAEROBIC [facility]  Final   Culture NO GROWTH 6 DAYS  Final   Report Status 07/29/2015 FINAL  Final  Culture, blood (routine x 2)     Status: None   Collection Time: 07/23/15  8:50 PM  Result Value Ref Range Status   Specimen Description BLOOD LEFT ANTECUBITAL  Final   Special Requests BOTTLES DRAWN AEROBIC AND ANAEROBIC [facility]  Final   Culture NO GROWTH 6 DAYS  Final   Report Status 07/29/2015 FINAL  Final  MRSA PCR Screening     Status: Abnormal   Collection Time: 07/24/15  1:20 AM  Result Value Ref Range Status   MRSA by PCR POSITIVE (A) NEGATIVE Final    Comment:        The GeneXpert MRSA Assay (FDA approved for NASAL specimens only), is one component of a comprehensive MRSA colonization surveillance program. It is not intended to diagnose MRSA infection nor to guide or monitor treatment for MRSA infections. RESULT CALLED TO, READ BACK BY AND VERIFIED WITH: M ENNIS,RN_0  07/24/15 MKELLY   Acid Fast Smear (AFB)     Status: None   Collection Time: 07/25/15 10:08 AM  Result Value Ref Range Status   AFB Specimen Processing Concentration  Final   Acid Fast Smear Negative  Final    Comment: (NOTE) Performed At: [facility] [place], [place] [id number] Lindon Romp MD [id number]    Source (AFB) ABSCESS  Final    Comment: DISC L1 L2  Culture, routine-abscess     Status: None   Collection Time: 07/25/15 10:08 AM  Result Value Ref Range Status   Specimen Description ABSCESS  Final   Special Requests DISC L1 L2  Final   Gram Stain   Final    MODERATE WBC PRESENT,BOTH PMN AND MONONUCLEAR NO SQUAMOUS EPITHELIAL CELLS SEEN NO ORGANISMS SEEN Performed at [facility]    Culture   Final    NO GROWTH 2 DAYS Performed at [facility]    Report Status 07/27/2015 FINAL  Final  Fungus Culture With Stain     Status: None (Preliminary result)   Collection Time: 07/25/15 10:08 AM  Result Value Ref Range Status    Fungus Stain Final report  Final    Comment: (NOTE) Performed At: [facility] [place], [place] [id number] Lindon Romp MD [id number]    Fungus (Mycology) Culture PENDING  Incomplete   Fungal Source ABSCESS  Final    Comment: DISC L1 L2  Fungus Culture Result     Status: None   Collection Time: 07/25/15 10:08 AM  Result Value Ref Range Status   Result 1 Comment  Final    Comment: (NOTE) KOH/Calcofluor preparation:  no fungus observed. Performed At: [facility] [place], [place] [id number] Lindon Romp MD OI:[id number]      Labs: Basic Metabolic Panel:  Recent Labs Lab 07/23/15 1631 07/25/15 0556 07/29/15 0413  NA 139 138 139  K 3.7 3.5 4.0  CL 102 100* 102  CO2 _1 GLUCOSE 99 75 103*  BUN _2 CREATININE 1.09* 0.99 0.90  CALCIUM 8.8* 8.9 9.5   Liver Function Tests: No results for input(s): AST, ALT, ALKPHOS, BILITOT, PROT, ALBUMIN in the last 168 hours. No results for input(s): LIPASE, AMYLASE in the last 168 hours. No results for input(s): AMMONIA in the last 168 hours. CBC:  Recent Labs Lab 07/23/15 1631 07/25/15 0556  WBC 4.5 4.6  NEUTROABS 2.9  --   HGB 11.7* 12.2  HCT 34.5* 37.0  MCV 95.3 93.9  PLT 131* 169   Cardiac Enzymes: No results for input(s): CKTOTAL, CKMB, CKMBINDEX, TROPONINI in the last 168 hours. BNP: BNP (last 3 results) No results for input(s): BNP in the last 8760 hours.  ProBNP (last 3 results) No results for input(s): PROBNP in the last 8760 hours.  CBG: No results for input(s): GLUCAP in the last 168 hours.     Signed:  Louellen Molder MD.  [facility] 07/29/2015, 10:52 AM

## 2015-07-29 NOTE — Progress Notes (Signed)
D/C orders received, pt for D/C'd to SNF.  IV and telemetry D/C.  Rx and D/C instructions given with verbalized understanding. EMS brought pt downstairs via stretcher.

## 2015-07-29 NOTE — Progress Notes (Signed)
Patient has accepted bed offer at [facility]. Facility has been informed of patient and family's selection. Per facility representative, patient can arrive to facility at anytime. CSW informed patient and family of transfer time. Patient and family appreciative of CSW services.   Patient to be transported via [facility] to [facility] on today. D/C Summary sent via Hub system. No further needs were requested at this time. CSW to sign off.   Please re-consult if further CSW needs arise.   Lucius Conn, [place] Worker [facility] Ph: [phone]

## 2015-07-29 NOTE — Progress Notes (Signed)
Pharmacy Antibiotic Note  Diane Abbott is a 81 y.o. female admitted on 07/23/2015 with discitis/osteo.  Pharmacy has been consulted for vancomycin and cefepime dosing.  Patient did receive antibiotics earlier in admission, but were then held pending biopsy- completed today in IR. Sample sent for culture. Plan is for an extended course of abx (minimum of 6 wks)  Vancomycin trough elevated at 30 mcg / dL  Plan: Change Vancomycin to 1 gram iv Q 24 hours starting 3/28 Cefepime 2g IV q24h   Height: 5\' 2"  (157.5 cm) Weight: 158 lb 15.2 oz (72.1 kg) IBW/kg (Calculated) : 50.1  Temp (24hrs), Avg:98.5 F (36.9 C), Min:97.7 F (36.5 C), Max:99.7 F (37.6 C)   Recent Labs Lab 07/23/15 1631 07/25/15 0556 07/29/15 0413 07/29/15 1120  WBC 4.5 4.6  --   --   CREATININE 1.09* 0.99 0.90  --   VANCOTROUGH  --   --   --  30*    Estimated Creatinine Clearance: 47.1 mL/min (by C-G formula based on Cr of 0.9).    Allergies  Allergen Reactions  . Codeine Other (See Comments)    Blood pressure drops; "I pass out."  . Pramipexole Dihydrochloride Nausea Only    sick, falling  . Rofecoxib Other (See Comments)    "sleep walks"  . Arava [Leflunomide] Swelling  . Clonazepam   . Diclofenac Sodium   . Venlafaxine   . Alprazolam Other (See Comments)    "Makes me mean"  . Darvocet [Propoxyphene N-Acetaminophen] Other (See Comments)    "makes my eyes dilate. Pt stated I can't see"  . Duloxetine Other (See Comments)    achy legs  . Gabapentin Other (See Comments)    headache  . Latex Itching and Dermatitis    When examined with latex gloves, burns and itches in contact areas per pt.  . Morphine And Related Other (See Comments)    Hallucinations   . Nortriptyline Hcl Other (See Comments)    Migraines   . Penicillins Rash    Has patient had a PCN reaction causing immediate rash, facial/tongue/throat swelling, SOB or lightheadedness with hypotension: Yes Has patient had a PCN reaction  causing severe rash involving mucus membranes or skin necrosis: Yes Has patient had a PCN reaction that required hospitalization No Has patient had a PCN reaction occurring within the last 10 years: Yes If all of the above answers are "NO", then may proceed with Cephalosporin use.   Can take Cephalosporins  . Prednisone Swelling    Just doesn't want to take it.    Antimicrobials this admission: Vancomycin 3/21 x1; 3/23>> Cefepime 3/21 x1; 3/23>>  Dose adjustments this admission: N/A  Microbiology results: 3/23 aspirate: neg 3/21 Bcx: ngtd 3/22 MRSA PCR: pos 3/23 AFB smear>>neg  Thank you Anette Guarneri, PharmD [phone]   07/29/2015 1:02 PM

## 2015-07-29 NOTE — Clinical Social Work Placement (Signed)
   CLINICAL SOCIAL WORK PLACEMENT  NOTE  Date:  07/29/2015  Patient Details  Name: Diane Abbott MRN: [id number] Date of Birth: [birth year]  Clinical Social Work is seeking post-discharge placement for this patient at the [place] level of care (*CSW will initial, date and re-position this form in  chart as items are completed):  Yes   Patient/family provided with [place] Work Department's list of facilities offering this level of care within the geographic area requested by the patient (or if unable, by the patient's family).  Yes   Patient/family informed of their freedom to choose among providers that offer the needed level of care, that participate in Medicare, Medicaid or managed care program needed by the patient, have an available bed and are willing to accept the patient.  Yes   Patient/family informed of [facility]'s ownership interest in [facility] and [facility], as well as of the fact that they are under no obligation to receive care at these facilities.  PASRR submitted to EDS on 07/28/15     PASRR number received on 07/28/15     Existing PASRR number confirmed on       FL2 transmitted to all facilities in geographic area requested by pt/family on 07/28/15     FL2 transmitted to all facilities within larger geographic area on 07/29/15     Patient informed that his/her managed care company has contracts with or will negotiate with certain facilities, including the following:        Yes   Patient/family informed of bed offers received.  Patient chooses bed at  [facility])     Physician recommends and patient chooses bed at      Patient to be transferred to  [facility]) on 07/29/15.  Patient to be transferred to facility by [facility]     Patient family notified on 07/29/15 of transfer.  Name of family member notified:  Patient is aware and stated she will inform family     PHYSICIAN        Additional Comment:    _______________________________________________ [name], LCSW 07/29/2015, 11:18 AM

## 2015-07-30 ENCOUNTER — Encounter (HOSPITAL_COMMUNITY)
Admission: RE | Admit: 2015-07-30 | Discharge: 2015-07-30 | Disposition: A | Discharge status: Home / Self Care | Payer: Medicare Other | Source: Skilled Nursing Facility | Attending: Internal Medicine | Admitting: Internal Medicine

## 2015-07-30 ENCOUNTER — Encounter: Payer: Self-pay | Admitting: Internal Medicine

## 2015-07-30 ENCOUNTER — Non-Acute Institutional Stay (SKILLED_NURSING_FACILITY): Payer: Medicare Other | Admitting: Internal Medicine

## 2015-07-30 DIAGNOSIS — M4646 Discitis, unspecified, lumbar region: Secondary | ICD-10-CM | POA: Insufficient documentation

## 2015-07-30 DIAGNOSIS — M4647 Discitis, unspecified, lumbosacral region: Secondary | ICD-10-CM

## 2015-07-30 DIAGNOSIS — Z86718 Personal history of other venous thrombosis and embolism: Secondary | ICD-10-CM | POA: Diagnosis not present

## 2015-07-30 DIAGNOSIS — M4626 Osteomyelitis of vertebra, lumbar region: Secondary | ICD-10-CM

## 2015-07-30 DIAGNOSIS — Z95828 Presence of other vascular implants and grafts: Secondary | ICD-10-CM | POA: Diagnosis not present

## 2015-07-30 DIAGNOSIS — K582 Mixed irritable bowel syndrome: Secondary | ICD-10-CM | POA: Diagnosis not present

## 2015-07-30 DIAGNOSIS — G062 Extradural and subdural abscess, unspecified: Secondary | ICD-10-CM | POA: Diagnosis not present

## 2015-07-30 LAB — CBC WITH DIFFERENTIAL/PLATELET
BASOS ABS: 0 10*3/uL (ref 0.0–0.1)
BASOS PCT: 1 %
EOS ABS: 0.2 10*3/uL (ref 0.0–0.7)
Eosinophils Relative: 3 %
HEMATOCRIT: 34.9 % — AB (ref 36.0–46.0)
Hemoglobin: 11.6 g/dL — ABNORMAL LOW (ref 12.0–15.0)
Lymphocytes Relative: 32 %
Lymphs Abs: 1.4 10*3/uL (ref 0.7–4.0)
MCH: 31.8 pg (ref 26.0–34.0)
MCHC: 33.2 g/dL (ref 30.0–36.0)
MCV: 95.6 fL (ref 78.0–100.0)
MONO ABS: 0.3 10*3/uL (ref 0.1–1.0)
Monocytes Relative: 7 %
NEUTROS ABS: 2.5 10*3/uL (ref 1.7–7.7)
Neutrophils Relative %: 57 %
PLATELETS: 252 10*3/uL (ref 150–400)
RBC: 3.65 MIL/uL — ABNORMAL LOW (ref 3.87–5.11)
RDW: 13.1 % (ref 11.5–15.5)
WBC: 4.4 10*3/uL (ref 4.0–10.5)

## 2015-07-30 LAB — BASIC METABOLIC PANEL
ANION GAP: 10 (ref 5–15)
BUN: 19 mg/dL (ref 6–20)
CALCIUM: 9.3 mg/dL (ref 8.9–10.3)
CO2: 29 mmol/L (ref 22–32)
Chloride: 100 mmol/L — ABNORMAL LOW (ref 101–111)
Creatinine, Ser: 0.94 mg/dL (ref 0.44–1.00)
GFR, EST NON AFRICAN AMERICAN: 56 mL/min — AB (ref >60)
Glucose, Bld: 95 mg/dL (ref 65–99)
Potassium: 3.8 mmol/L (ref 3.5–5.1)
SODIUM: 139 mmol/L (ref 135–145)

## 2015-07-30 NOTE — Assessment & Plan Note (Signed)
Monitor closely for potential C. difficile colitis on dual antibiotic therapy in context of past history of irritable bowel syndrome Prophylactic Diflucan

## 2015-07-30 NOTE — Assessment & Plan Note (Signed)
Follow-up appointment with Dr Cyndia Bent in mid May 2017

## 2015-07-30 NOTE — Progress Notes (Signed)
This is a comprehensive admission to [facility] personally performed by Unice Cobble MD on this date less than 30 days from date of admission. PCP:Dr Plotnikov HPI: She was hospitalized 3/21-3/27/17 with discitis of the lumbosacral region in the context of epidural abscess.Additionally she's had a chronic thoracic compression fracture. Initial presentation was history of progressive pain in the low back with radiation into the lower extremities over 1 week. She had no associated neurologic symptoms. CT of the lumbar spine showed discitis at L1-L2 and possible right paraspinal muscle mass. MRI confirmed discitis over L1-2 vertebral disc space associated epidural and paraspinous phlegmon. Possible small epidural abscesses just posterior to the L2 vertebral body and within the right psoas muscle. Moderate spinal stenosis at L1-2 was suggested as well as their stenosis at L4-5 in the context of multilevel degenerative spondylosis and facet arthrosis  Under fluoroscopy 2.5 cc of a thick bloody material was aspirated. Dr. Linus Salmons, infectious disease recommended a minimum of 6 weeks of antibiotics. Specifically she was to receive IV vancomycin and Cefepime till 09/05/15 she was to follow-up with Dr. Linus Salmons in late April 2017. Diflucan was prescribed for thrush prophylaxis   Past medical and surgical history: She has medical history of chronic low back pain; restless leg syndrome; rheumatoid arthritis; chronic kidney disease stage III with GFR ranging 30-59 She's has an ascending thoracic aortic aneurysm and aortic valve surgery 08/14 and is monitored by  Dr.Bartle. 2-D echocardiogram revealed normal ejection fraction with stable aortic valve function. Residual aortic arch aneurysm measured 49 mm. Follow-up with him was recommended in mid May 2017.  Social history: She has never smoked but states that she has COPD related to secondhand smoke exposure from family members and her husband. She  occasionally has alcohol. She is an accomplished musician, Optometrist.  Family history: Updates concerning clotting disorders and pancreatic cancer were made in the chart  Comprehensive review of systems: Positive review of systems include alternating constipation and diarrhea in the context of irritable bowel syndrome. She states that probiotics have been of minimal benefit in treating this. She describes fibromyalgia which resulted in her being bedridden" for 5 years".  At this time her major issue is a chronic low back pain she's receiving narcotics on an as-needed basis. The narcotic dose has been increased recently from 10 mg to 15 mg according to her. This is not associated with neurologic complications or symptoms. Constitutional: No fever, chills, significant weight change, fatigue or night sweats Eyes: No redness, discharge, pain,  double vision or loss of vision. Occasional blurred vision ENT/mouth: No nasal congestion, postnasal drainage,epistaxis, purulent discharge, earache, hearing loss, tinnitus ,sore throat , dental pain or hoarseness   Cardiovascular: No chest pain, palpitations, racing, irregular rhythm, syncope, claudication or edema  Respiratory: No cough, sputum production,hemoptysis,  dyspnea, paroxysmal nocturnal dyspnea, pleuritic chest pain, significant snoring or  apnea    Gastrointestinal: No heartburn,dysphagia, nausea and vomiting,abdominal pain,  rectal bleeding or melena Genitourinary: No dysuria,hematuria, pyuria, frequency, urgency,  incontinence, nocturia, or dark urine  Musculoskeletal: No myalgias or muscle cramping, joint color change, weakness Dermatologic: No rash, pruritus, urticaria, or change in color or temperature of skin Neurologic: No headache, vertigo, limb weakness, tremor, gait disturbance, seizures, memory loss, numbness or tingling Psychiatric: No significant anxiety or depression, panic attacks, insomnia, or anorexia Endocrine: No change in  hair/skin/ nails, excessive thirst, excessive hunger, excessive urination or unexplained fatigue Hematologic/lymphatic: No bruising, lymphadenopathy or  abnormal clotting Allergy/immunology: No itchy/ watery  eyes, significant sneezing, rhinitis, urticaria or angioedema  Physical exam:  Pertinent or positive findings: She has a grade 1. 5-2 systolic murmur at the base. There is dullness to percussion in the right upper quadrant. There is some interosseous muscle wasting of the hands. Fusiform change in the knees are present with crepitus. Well-healed operative scars are present over each knee. Pulses are decreased especially dorsalis pedis pulses. She has a large osteoma at the ulnar wrist bilaterally, left greater than the right. Scattered bruising over the forearms, right greater than left. General appearance:Adequately nourished; no acute distress or increased work of breathing is present.   Lymphatic: No  lymphadenopathy about the head, neck, or axilla . Eyes: No conjunctival inflammation or lid edema is present. There is no scleral icterus. Ears:  External ear exam shows no significant lesions or deformities.   Nose:  External nasal examination shows no deformity or inflammation. Nasal mucosa are pink and moist without lesions or exudates No septal dislocation or deviation.No obstruction to airflow.  Oral exam:Dental hygiene good; lips and gums are healthy appearing.There is no oropharyngeal erythema or exudate . Neck:  No deformities, thyromegaly, masses, or tenderness noted.   Supple with full range of motion without pain.  Heart:  Normal rate and regular rhythm. S1 and S2 normal without gallop, click, rub or other extra sounds.  Lungs:Chest clear to auscultation; no wheezes, rhonchi,rales ,or rubs present. Abdomen:Bowel sounds are normal. Abdomen is soft and nontender with no organomegaly, hernias  or masses. GU: deferred as previously addressed. Extremities:  No cyanosis, edema, or clubbing  noted. Neurologic exam : Strength equal & normal in upper & lower extremities Balance normal and Romberg could not be tested because of the acute back pain Deep tendon reflexes are normal. Skin: Warm & dry w/o tenting or jaundice. No significant lesions or rash.  See summary under each active problem in the Problem List with associated updated therapeutic plan

## 2015-07-30 NOTE — Assessment & Plan Note (Signed)
Reconsider trial of probiotic C. difficile stool studies should diarrhea occur in the context of her dual antibiotic therapy which will be continued until 5-4/17 to treat discitis

## 2015-07-30 NOTE — Patient Instructions (Signed)
See summary under each active problem in the Problem List with associated updated therapeutic plan. Completed document faxed to [facility]. 

## 2015-07-31 ENCOUNTER — Telehealth: Payer: Self-pay | Admitting: *Deleted

## 2015-07-31 NOTE — Telephone Encounter (Signed)
Pt was on TCM list admitted for acute osteomylitis of lumbar spine. Pt d/c 07/29/15 sent to SNF...//lmb

## 2015-08-01 ENCOUNTER — Encounter (HOSPITAL_COMMUNITY)
Admission: RE | Admit: 2015-08-01 | Discharge: 2015-08-01 | Disposition: A | Discharge status: Home / Self Care | Payer: Medicare Other | Source: Skilled Nursing Facility | Attending: *Deleted | Admitting: *Deleted

## 2015-08-01 LAB — BASIC METABOLIC PANEL
ANION GAP: 10 (ref 5–15)
ANION GAP: 11 (ref 5–15)
BUN: 16 mg/dL (ref 6–20)
CO2: 26 mmol/L (ref 22–32)
Calcium: 9.6 mg/dL (ref 8.9–10.3)
Chloride: 102 mmol/L (ref 101–111)
Creatinine, Ser: 0.89 mg/dL (ref 0.44–1.00)
GFR calc Af Amer: 36 mL/min — ABNORMAL LOW (ref >60)
GFR calc Af Amer: >60 mL/min (ref >60)
GLUCOSE: 104 mg/dL — AB (ref 65–99)
POTASSIUM: 3.8 mmol/L (ref 3.5–5.1)
Sodium: 139 mmol/L (ref 135–145)

## 2015-08-02 ENCOUNTER — Inpatient Hospital Stay (HOSPITAL_COMMUNITY): Admit: 2015-08-02 | Payer: Self-pay

## 2015-08-02 ENCOUNTER — Encounter (HOSPITAL_COMMUNITY)
Admission: RE | Admit: 2015-08-02 | Discharge: 2015-08-02 | Disposition: A | Discharge status: Home / Self Care | Payer: Medicare Other

## 2015-08-02 LAB — VANCOMYCIN, TROUGH: Vancomycin Tr: 12 ug/mL (ref 10.0–20.0)

## 2015-08-05 ENCOUNTER — Encounter (HOSPITAL_COMMUNITY)
Admission: AD | Admit: 2015-08-05 | Discharge: 2015-08-05 | Disposition: A | Discharge status: Home / Self Care | Payer: Medicare Other | Source: Skilled Nursing Facility | Attending: Internal Medicine | Admitting: Internal Medicine

## 2015-08-05 ENCOUNTER — Other Ambulatory Visit: Payer: Self-pay | Admitting: *Deleted

## 2015-08-05 DIAGNOSIS — M4646 Discitis, unspecified, lumbar region: Secondary | ICD-10-CM | POA: Insufficient documentation

## 2015-08-05 DIAGNOSIS — M4626 Osteomyelitis of vertebra, lumbar region: Secondary | ICD-10-CM | POA: Insufficient documentation

## 2015-08-05 LAB — BASIC METABOLIC PANEL
ANION GAP: 9 (ref 5–15)
BUN: 16 mg/dL (ref 6–20)
CALCIUM: 9.2 mg/dL (ref 8.9–10.3)
CO2: 25 mmol/L (ref 22–32)
CREATININE: 0.95 mg/dL (ref 0.44–1.00)
Chloride: 104 mmol/L (ref 101–111)
GFR calc non Af Amer: 55 mL/min — ABNORMAL LOW (ref >60)
GLUCOSE: 146 mg/dL — AB (ref 65–99)
Potassium: 3.4 mmol/L — ABNORMAL LOW (ref 3.5–5.1)
Sodium: 138 mmol/L (ref 135–145)

## 2015-08-05 LAB — VANCOMYCIN, TROUGH: Vancomycin Tr: 15 ug/mL (ref 10.0–20.0)

## 2015-08-05 MED ORDER — OXYCODONE-ACETAMINOPHEN 5-325 MG PO TABS: ORAL_TABLET | ORAL | Status: DC

## 2015-08-05 MED ORDER — OXYCODONE-ACETAMINOPHEN 5-325 MG PO TABS: 2.0000 | ORAL_TABLET | ORAL | Status: DC | PRN

## 2015-08-05 NOTE — Telephone Encounter (Signed)
[facility] 

## 2015-08-06 ENCOUNTER — Non-Acute Institutional Stay (SKILLED_NURSING_FACILITY): Payer: Medicare Other | Admitting: Internal Medicine

## 2015-08-06 ENCOUNTER — Encounter (HOSPITAL_COMMUNITY)
Admission: RE | Admit: 2015-08-06 | Discharge: 2015-08-06 | Disposition: A | Discharge status: Home / Self Care | Payer: Medicare Other | Source: Skilled Nursing Facility | Attending: *Deleted | Admitting: *Deleted

## 2015-08-06 ENCOUNTER — Encounter: Payer: Self-pay | Admitting: Internal Medicine

## 2015-08-06 ENCOUNTER — Other Ambulatory Visit: Payer: Self-pay

## 2015-08-06 DIAGNOSIS — K5909 Other constipation: Secondary | ICD-10-CM | POA: Diagnosis not present

## 2015-08-06 DIAGNOSIS — S22000D Wedge compression fracture of unspecified thoracic vertebra, subsequent encounter for fracture with routine healing: Secondary | ICD-10-CM | POA: Diagnosis not present

## 2015-08-06 LAB — CBC WITH DIFFERENTIAL/PLATELET
BASOS PCT: 1 %
Basophils Absolute: 0.1 10*3/uL (ref 0.0–0.1)
EOS ABS: 0.3 10*3/uL (ref 0.0–0.7)
Eosinophils Relative: 7 %
HCT: 34.9 % — ABNORMAL LOW (ref 36.0–46.0)
HEMOGLOBIN: 11.6 g/dL — AB (ref 12.0–15.0)
LYMPHS ABS: 1.7 10*3/uL (ref 0.7–4.0)
Lymphocytes Relative: 36 %
MCH: 31.8 pg (ref 26.0–34.0)
MCHC: 33.2 g/dL (ref 30.0–36.0)
MCV: 95.6 fL (ref 78.0–100.0)
Monocytes Absolute: 0.4 10*3/uL (ref 0.1–1.0)
Monocytes Relative: 8 %
NEUTROS ABS: 2.4 10*3/uL (ref 1.7–7.7)
NEUTROS PCT: 48 %
Platelets: 248 10*3/uL (ref 150–400)
RBC: 3.65 MIL/uL — AB (ref 3.87–5.11)
RDW: 13.2 % (ref 11.5–15.5)
WBC: 4.8 10*3/uL (ref 4.0–10.5)

## 2015-08-06 MED ORDER — OXYCODONE-ACETAMINOPHEN 5-325 MG PO TABS: ORAL_TABLET | ORAL | Status: DC

## 2015-08-06 NOTE — Progress Notes (Signed)
    This is a nursing facility follow up for specific acute issue . The patient wished to discuss her breakthrough pain medication schedule. An order had been written in my name for her to receive Percocet 5/325 2 pills every 4 hours as needed. This was in addition to the OxyContin 10 mg every 12 hours. Based on review of the MPR ( Monthly Prescribing Reference):;maximum dose for this prescription would be one pill every 6 hours. The major concern would be hepatic risk. I shared my concern about this risk with. Her response was "you are more interested in your medical license than you are me".  I offered to increase the long-acting oxycodone 15 mg every 12 hours to 20 mg every 12 hours. I did also share the concern related to that medicine of possible respiratory suppression as well as adverse effects such as constipation. She states that with prunes she has finally been able to have a bowel movement. She stated that she has an aneurysm is not sure how long she will live .As surgery is not an option; she  is more concerned with pain control  than length of life. She expressed an interest in decreasing number of medications and specifically requested vitamin C be discontinued. I explained that this medicine may help absorb iron. I did write the discontinuation are.  [id number] or pertinent ROS noted above in HPI. Cardiovascular: no chest pain, palpitations, racing, irregular rhythm, syncope, nausea, sweating, claudication, or edema  Respiratory: No cough, sputum production,hemoptysis,  dyspnea, paroxysmal nocturnal dyspnea, pleuritic chest pain, significant snoring, or  apnea    Gastrointestinal: No vomiting,abdominal pain.  Pertinent or positive findings: She is slightly lethargic and speech is slightly slurred. She is oriented 3. General appearance:Adequately nourished; no acute distress or increased work of breathing is present.   Lymphatic: No  lymphadenopathy about the head, neck, or axilla  . Eyes: No conjunctival inflammation or lid edema is present. There is no scleral icterus. Neck:  No deformities, masses, or tenderness noted.   Heart:  Normal rate and regular rhythm. S1 and S2 normal without gallop, murmur, click, rub or other extra sounds.  Lungs:Chest clear to auscultation; no wheezes, rhonchi,rales ,or rubs present. Abdomen:Bowel sounds are normal. Abdomen is soft and nontender with no organomegaly, hernias  or masses. Extremities:  No cyanosis, edema, or clubbing noted. Skin: Warm & dry w/o tenting or jaundice. No significant lesions or rash.  See summary under each active problem in the Problem List with associated updated therapeutic plan

## 2015-08-06 NOTE — Telephone Encounter (Signed)
Rx faxed to [facility] at [phone].   Phone #: [phone]  

## 2015-08-06 NOTE — Patient Instructions (Signed)
See summary under each active problem in the Problem List with associated updated therapeutic plan. Completed document transmitted to [facility]. 

## 2015-08-06 NOTE — Assessment & Plan Note (Signed)
Improved with prunes Defer options such as Movantik to GI

## 2015-08-06 NOTE — Assessment & Plan Note (Signed)
See adjustment in narcotic schedule

## 2015-08-07 ENCOUNTER — Encounter (HOSPITAL_COMMUNITY)
Admission: RE | Admit: 2015-08-07 | Discharge: 2015-08-07 | Disposition: A | Discharge status: Home / Self Care | Payer: Medicare Other | Source: Skilled Nursing Facility | Attending: *Deleted | Admitting: *Deleted

## 2015-08-07 LAB — BASIC METABOLIC PANEL
ANION GAP: 12 (ref 5–15)
BUN: 18 mg/dL (ref 6–20)
CO2: 25 mmol/L (ref 22–32)
Calcium: 9.4 mg/dL (ref 8.9–10.3)
Chloride: 102 mmol/L (ref 101–111)
Creatinine, Ser: 0.79 mg/dL (ref 0.44–1.00)
GFR calc Af Amer: >60 mL/min (ref >60)
GFR calc non Af Amer: >60 mL/min (ref >60)
GLUCOSE: 104 mg/dL — AB (ref 65–99)
POTASSIUM: 4 mmol/L (ref 3.5–5.1)
Sodium: 139 mmol/L (ref 135–145)

## 2015-08-07 LAB — VANCOMYCIN, TROUGH: Vancomycin Tr: 15 ug/mL (ref 10.0–20.0)

## 2015-08-08 ENCOUNTER — Encounter: Payer: Self-pay | Admitting: Internal Medicine

## 2015-08-08 ENCOUNTER — Non-Acute Institutional Stay (SKILLED_NURSING_FACILITY): Payer: Medicare Other | Admitting: Internal Medicine

## 2015-08-08 ENCOUNTER — Other Ambulatory Visit (HOSPITAL_COMMUNITY)
Admission: RE | Admit: 2015-08-08 | Discharge: 2015-08-08 | Disposition: A | Discharge status: Home / Self Care | Payer: No Typology Code available for payment source | Source: Skilled Nursing Facility | Attending: Internal Medicine | Admitting: Internal Medicine

## 2015-08-08 DIAGNOSIS — M4647 Discitis, unspecified, lumbosacral region: Secondary | ICD-10-CM | POA: Insufficient documentation

## 2015-08-08 DIAGNOSIS — R262 Difficulty in walking, not elsewhere classified: Secondary | ICD-10-CM | POA: Insufficient documentation

## 2015-08-08 DIAGNOSIS — M4626 Osteomyelitis of vertebra, lumbar region: Secondary | ICD-10-CM | POA: Insufficient documentation

## 2015-08-08 DIAGNOSIS — M4856XA Collapsed vertebra, not elsewhere classified, lumbar region, initial encounter for fracture: Secondary | ICD-10-CM | POA: Insufficient documentation

## 2015-08-08 DIAGNOSIS — M549 Dorsalgia, unspecified: Secondary | ICD-10-CM | POA: Diagnosis not present

## 2015-08-08 DIAGNOSIS — G8929 Other chronic pain: Secondary | ICD-10-CM

## 2015-08-08 LAB — BASIC METABOLIC PANEL
ANION GAP: 13 (ref 5–15)
BUN: 18 mg/dL (ref 6–20)
CALCIUM: 9.7 mg/dL (ref 8.9–10.3)
CO2: 24 mmol/L (ref 22–32)
Chloride: 102 mmol/L (ref 101–111)
Creatinine, Ser: 0.93 mg/dL (ref 0.44–1.00)
GFR, EST NON AFRICAN AMERICAN: 57 mL/min — AB (ref >60)
GLUCOSE: 113 mg/dL — AB (ref 65–99)
Potassium: 4 mmol/L (ref 3.5–5.1)
SODIUM: 139 mmol/L (ref 135–145)

## 2015-08-08 LAB — HEMOGLOBIN A1C
HEMOGLOBIN A1C: 5.9 % — AB (ref 4.8–5.6)
MEAN PLASMA GLUCOSE: 123 mg/dL

## 2015-08-08 LAB — VANCOMYCIN, TROUGH: VANCOMYCIN TR: 16 ug/mL (ref 10.0–20.0)

## 2015-08-08 NOTE — Assessment & Plan Note (Addendum)
If home health care arrangements can be mad; IV vancomycin and cefepime will be continued until 09/05/15. Vancomycin level monitor as per pharmacy recommendations. Associated pain will be treated with oxycodone 5/325 one every 6 hours as needed for break through pain and continuing the extended release oxycodone 20 mg every 12 hours. Potential hepatic risk with the acetaminophen-containing product was discussed. Respiratory suppression risk associated with long-acting narcotic was also discussed.  A two-week maximum prescription would be provided until she can be evaluated by her primary care physician.

## 2015-08-08 NOTE — Progress Notes (Signed)
   PCP: Walker Kehr, MD [place] [id number]   The patient is being discharged from [facility] if outpatient cefepine &  vancomycin therapy and vancomycin serum level monitor can be arranged. At the time of dictation these arrangements are being pursued by [facility]. Social worker is also determining any applicable co-pays for these home services for which the patient would be responsible and will communicate this to the patient   The medical history in this facility was reviewed and summarized and medical problem list was updated.   Summary of [place] stays: She was admitted 07/30/15 to continue IV vancomycin & Cefepine for a total of 6 weeks ending 09/05/15 as per the infectious disease specialist,Dr Comer to treat discitis of lumbosacral area.  The GFR has been minimally reduced but creatinine and BUN been normal. Vancomycin levels have been therapeutic. She has had mild hyperglycemia with a range of 104-146. A1c was nondiabetic at 5.9%. She has been receiving physical therapy while at [facility] and her ambulation has increased significantly .She is able to go to the bathroom by herself   Active or positive review of systems at discharge: She continues to have chronic back pain for which she is on both long-acting and as needed narcotics for breakthrough pain.  Physical examination:Pertinent or positive findings include: she is alert and oriented today. She has a harsh grade [id number] systolic murmur loudest at the left base. She has trace edema at the ankles. Pulses are slightly decreased; especially dorsalis pedis pulses.  General appearance :adequately nourished; in no distress. Eyes: No conjunctival inflammation or scleral icterus is present. Oral exam:  Lips and gums are healthy appearing. Heart:  Normal rate and regular rhythm. S1 and S2 normal without gallop, click, rub or other extra sounds   Lungs:Chest clear to auscultation; no wheezes,  rhonchi,rales ,or rubs present.No increased work of breathing.  Abdomen: bowel sounds normal, soft and non-tender without masses, organomegaly or hernias noted.  No guarding or rebound. By history she has an aneurysm; none is palpable clinically. Vascular : all pulses equal ; no bruits present. Skin:Warm & dry.  Intact without suspicious lesions or rashes ; no tenting or jaundice  Lymphatic: No lymphadenopathy is noted about the head, neck, axillaeas. Neuro: Strength, tone generally decreased  Discharge diagnoses:#1 lumbosacral area discitis #2 chronic low back pain  Discharge instructions were written and discharge instructions provided. Follow-up will be by the primary care physician in seven - 14 days.

## 2015-08-08 NOTE — Patient Instructions (Signed)
See summary under each active problem in the Problem List with associated updated therapeutic plan. Completed document transmitted to [facility]. 

## 2015-08-09 ENCOUNTER — Encounter (HOSPITAL_COMMUNITY)
Admission: RE | Admit: 2015-08-09 | Discharge: 2015-08-09 | Disposition: A | Discharge status: Home / Self Care | Payer: Medicare Other | Source: Skilled Nursing Facility | Attending: *Deleted | Admitting: *Deleted

## 2015-08-09 LAB — CREATININE, SERUM
CREATININE: 0.86 mg/dL (ref 0.44–1.00)
GFR calc Af Amer: >60 mL/min (ref >60)
GFR calc non Af Amer: >60 mL/min (ref >60)

## 2015-08-09 LAB — VANCOMYCIN, TROUGH: VANCOMYCIN TR: 14 ug/mL (ref 10.0–20.0)

## 2015-08-09 LAB — HEMOGLOBIN A1C
HEMOGLOBIN A1C: 6.2 % — AB (ref 4.8–5.6)
MEAN PLASMA GLUCOSE: 131 mg/dL

## 2015-08-09 LAB — BUN: BUN: 17 mg/dL (ref 6–20)

## 2015-08-10 ENCOUNTER — Other Ambulatory Visit: Payer: Self-pay | Admitting: Internal Medicine

## 2015-08-10 DIAGNOSIS — M4647 Discitis, unspecified, lumbosacral region: Secondary | ICD-10-CM | POA: Diagnosis not present

## 2015-08-10 DIAGNOSIS — M545 Low back pain: Secondary | ICD-10-CM | POA: Diagnosis not present

## 2015-08-10 DIAGNOSIS — Z452 Encounter for adjustment and management of vascular access device: Secondary | ICD-10-CM | POA: Diagnosis not present

## 2015-08-10 DIAGNOSIS — Z5181 Encounter for therapeutic drug level monitoring: Secondary | ICD-10-CM | POA: Diagnosis not present

## 2015-08-10 DIAGNOSIS — G894 Chronic pain syndrome: Secondary | ICD-10-CM | POA: Diagnosis not present

## 2015-08-10 DIAGNOSIS — M4627 Osteomyelitis of vertebra, lumbosacral region: Secondary | ICD-10-CM | POA: Diagnosis not present

## 2015-08-10 MED ORDER — FLUCONAZOLE 100 MG PO TABS: ORAL_TABLET | ORAL | Status: DC

## 2015-08-10 MED ORDER — ROPINIROLE HCL 2 MG PO TABS: 2.0000 mg | ORAL_TABLET | Freq: Three times a day (TID) | ORAL | Status: DC

## 2015-08-11 ENCOUNTER — Other Ambulatory Visit (HOSPITAL_COMMUNITY)
Admission: RE | Admit: 2015-08-11 | Discharge: 2015-08-11 | Disposition: A | Discharge status: Home / Self Care | Payer: Medicare Other | Source: Other Acute Inpatient Hospital | Attending: Internal Medicine | Admitting: Internal Medicine

## 2015-08-11 DIAGNOSIS — M4627 Osteomyelitis of vertebra, lumbosacral region: Secondary | ICD-10-CM | POA: Diagnosis not present

## 2015-08-11 DIAGNOSIS — M545 Low back pain: Secondary | ICD-10-CM | POA: Diagnosis not present

## 2015-08-11 DIAGNOSIS — Z5181 Encounter for therapeutic drug level monitoring: Secondary | ICD-10-CM | POA: Insufficient documentation

## 2015-08-11 DIAGNOSIS — M4647 Discitis, unspecified, lumbosacral region: Secondary | ICD-10-CM | POA: Diagnosis not present

## 2015-08-11 DIAGNOSIS — G894 Chronic pain syndrome: Secondary | ICD-10-CM | POA: Diagnosis not present

## 2015-08-11 DIAGNOSIS — Z452 Encounter for adjustment and management of vascular access device: Secondary | ICD-10-CM | POA: Diagnosis not present

## 2015-08-11 LAB — CBC
HEMATOCRIT: 35.6 % — AB (ref 36.0–46.0)
HEMOGLOBIN: 11.5 g/dL — AB (ref 12.0–15.0)
MCH: 31.9 pg (ref 26.0–34.0)
MCHC: 32.3 g/dL (ref 30.0–36.0)
MCV: 98.6 fL (ref 78.0–100.0)
Platelets: 152 10*3/uL (ref 150–400)
RBC: 3.61 MIL/uL — AB (ref 3.87–5.11)
RDW: 13.5 % (ref 11.5–15.5)
WBC: 4 10*3/uL (ref 4.0–10.5)

## 2015-08-11 LAB — CREATININE, SERUM
Creatinine, Ser: 1.1 mg/dL — ABNORMAL HIGH (ref 0.44–1.00)
GFR calc non Af Amer: 46 mL/min — ABNORMAL LOW (ref >60)
GFR, EST AFRICAN AMERICAN: 54 mL/min — AB (ref >60)

## 2015-08-11 LAB — BUN: BUN: 20 mg/dL (ref 6–20)

## 2015-08-11 LAB — VANCOMYCIN, TROUGH: Vancomycin Tr: 16 ug/mL (ref 10.0–20.0)

## 2015-08-12 DIAGNOSIS — M4647 Discitis, unspecified, lumbosacral region: Secondary | ICD-10-CM | POA: Diagnosis not present

## 2015-08-12 DIAGNOSIS — G894 Chronic pain syndrome: Secondary | ICD-10-CM | POA: Diagnosis not present

## 2015-08-12 DIAGNOSIS — Z5181 Encounter for therapeutic drug level monitoring: Secondary | ICD-10-CM | POA: Diagnosis not present

## 2015-08-12 DIAGNOSIS — Z452 Encounter for adjustment and management of vascular access device: Secondary | ICD-10-CM | POA: Diagnosis not present

## 2015-08-12 DIAGNOSIS — M545 Low back pain: Secondary | ICD-10-CM | POA: Diagnosis not present

## 2015-08-12 DIAGNOSIS — M4627 Osteomyelitis of vertebra, lumbosacral region: Secondary | ICD-10-CM | POA: Diagnosis not present

## 2015-08-15 ENCOUNTER — Inpatient Hospital Stay: Payer: Medicare Other | Admitting: Family

## 2015-08-15 DIAGNOSIS — M4627 Osteomyelitis of vertebra, lumbosacral region: Secondary | ICD-10-CM | POA: Diagnosis not present

## 2015-08-15 DIAGNOSIS — Z452 Encounter for adjustment and management of vascular access device: Secondary | ICD-10-CM | POA: Diagnosis not present

## 2015-08-15 DIAGNOSIS — M4647 Discitis, unspecified, lumbosacral region: Secondary | ICD-10-CM | POA: Diagnosis not present

## 2015-08-15 DIAGNOSIS — M545 Low back pain: Secondary | ICD-10-CM | POA: Diagnosis not present

## 2015-08-15 DIAGNOSIS — Z5181 Encounter for therapeutic drug level monitoring: Secondary | ICD-10-CM | POA: Diagnosis not present

## 2015-08-15 DIAGNOSIS — G894 Chronic pain syndrome: Secondary | ICD-10-CM | POA: Diagnosis not present

## 2015-08-18 ENCOUNTER — Other Ambulatory Visit (HOSPITAL_COMMUNITY)
Admission: RE | Admit: 2015-08-18 | Discharge: 2015-08-18 | Disposition: A | Discharge status: Home / Self Care | Payer: Medicare Other | Source: Other Acute Inpatient Hospital | Attending: Internal Medicine | Admitting: Internal Medicine

## 2015-08-18 DIAGNOSIS — Z452 Encounter for adjustment and management of vascular access device: Secondary | ICD-10-CM | POA: Diagnosis not present

## 2015-08-18 DIAGNOSIS — Z5181 Encounter for therapeutic drug level monitoring: Secondary | ICD-10-CM | POA: Diagnosis not present

## 2015-08-18 DIAGNOSIS — G894 Chronic pain syndrome: Secondary | ICD-10-CM | POA: Diagnosis not present

## 2015-08-18 DIAGNOSIS — M545 Low back pain: Secondary | ICD-10-CM | POA: Diagnosis not present

## 2015-08-18 DIAGNOSIS — M4647 Discitis, unspecified, lumbosacral region: Secondary | ICD-10-CM | POA: Diagnosis not present

## 2015-08-18 DIAGNOSIS — M4627 Osteomyelitis of vertebra, lumbosacral region: Secondary | ICD-10-CM | POA: Diagnosis not present

## 2015-08-18 LAB — VANCOMYCIN, TROUGH: Vancomycin Tr: 21 ug/mL — ABNORMAL HIGH (ref 10.0–20.0)

## 2015-08-18 LAB — CBC
HCT: 32.5 % — ABNORMAL LOW (ref 36.0–46.0)
Hemoglobin: 11 g/dL — ABNORMAL LOW (ref 12.0–15.0)
MCH: 32.3 pg (ref 26.0–34.0)
MCHC: 33.8 g/dL (ref 30.0–36.0)
MCV: 95.3 fL (ref 78.0–100.0)
PLATELETS: 114 10*3/uL — AB (ref 150–400)
RBC: 3.41 MIL/uL — AB (ref 3.87–5.11)
RDW: 13.4 % (ref 11.5–15.5)
WBC: 4.1 10*3/uL (ref 4.0–10.5)

## 2015-08-18 LAB — BUN: BUN: 24 mg/dL — AB (ref 6–20)

## 2015-08-18 LAB — CREATININE, SERUM
CREATININE: 1.12 mg/dL — AB (ref 0.44–1.00)
GFR, EST AFRICAN AMERICAN: 53 mL/min — AB (ref >60)
GFR, EST NON AFRICAN AMERICAN: 45 mL/min — AB (ref >60)

## 2015-08-27 DIAGNOSIS — M545 Low back pain: Secondary | ICD-10-CM | POA: Diagnosis not present

## 2015-08-27 DIAGNOSIS — Z5181 Encounter for therapeutic drug level monitoring: Secondary | ICD-10-CM | POA: Diagnosis not present

## 2015-08-27 DIAGNOSIS — M4647 Discitis, unspecified, lumbosacral region: Secondary | ICD-10-CM | POA: Diagnosis not present

## 2015-08-27 DIAGNOSIS — G894 Chronic pain syndrome: Secondary | ICD-10-CM | POA: Diagnosis not present

## 2015-08-27 DIAGNOSIS — Z452 Encounter for adjustment and management of vascular access device: Secondary | ICD-10-CM | POA: Diagnosis not present

## 2015-08-27 DIAGNOSIS — M4627 Osteomyelitis of vertebra, lumbosacral region: Secondary | ICD-10-CM | POA: Diagnosis not present

## 2015-08-29 ENCOUNTER — Encounter: Payer: Self-pay | Admitting: Internal Medicine

## 2015-08-29 ENCOUNTER — Ambulatory Visit (INDEPENDENT_AMBULATORY_CARE_PROVIDER_SITE_OTHER): Payer: Medicare Other | Admitting: Internal Medicine

## 2015-08-29 VITALS — BP 120/80 | HR 76 | Wt 144.0 lb

## 2015-08-29 DIAGNOSIS — M359 Systemic involvement of connective tissue, unspecified: Secondary | ICD-10-CM | POA: Diagnosis not present

## 2015-08-29 DIAGNOSIS — B370 Candidal stomatitis: Secondary | ICD-10-CM | POA: Diagnosis not present

## 2015-08-29 DIAGNOSIS — M4646 Discitis, unspecified, lumbar region: Secondary | ICD-10-CM | POA: Diagnosis not present

## 2015-08-29 DIAGNOSIS — E538 Deficiency of other specified B group vitamins: Secondary | ICD-10-CM | POA: Diagnosis not present

## 2015-08-29 DIAGNOSIS — M4640 Discitis, unspecified, site unspecified: Secondary | ICD-10-CM | POA: Insufficient documentation

## 2015-08-29 MED ORDER — OXYCODONE HCL ER 10 MG PO T12A: 10.0000 mg | EXTENDED_RELEASE_TABLET | Freq: Three times a day (TID) | ORAL | Status: DC

## 2015-08-29 MED ORDER — FLUCONAZOLE 100 MG PO TABS: ORAL_TABLET | ORAL | Status: DC

## 2015-08-29 NOTE — Progress Notes (Signed)
Subjective:  Patient ID: Diane Abbott, female    DOB: 04-09-1935  Age: 81 y.o. MRN: [id number]  CC: No chief complaint on file.   HPI FLOREE ZUNIGA presents for LBP f/u - worse. The pt is back home from a NH: Acute lumbar discitis/osteomyelitis with possible epidural and psoas abscess -Seen by neurosurgery who recommended IR biopsy of the L1-L2 space.  - underwent fluoroscopy guided L1-L2 disc aspiration with 2.5 mL of thick bloody aspirate by IR. No growth on culture so far.. Elevated ESR and CRP. - resumed empiric vancomycin and cefepime. . ID recommends minimum 6 weeks of antibiotics (until 09/05/2015). Added Diflucan for thrush prophylaxis. -PICC line placed for long-term antibiotics.  -2-D echo done given systolic murmur and history of bioprosthetic valve shows normal EF with stable aortic valve. Localized aortic arch aneurysm measuring 49 mm. Pain control as below. Seen by PT/OT. Needs skilled Nursing facility.   Recent hx reviewed: Discitis of lumbosacral region  Epidural abscess  Low back pain  RESTLESS LEG SYNDROME  Rheumatoid arthritis (HCC)  S/P ascending aortic replacement  Chronic Thoracic compression fracture (HCC)  CKD (chronic kidney disease) stage 3, GFR 30-59 ml/min  Outpatient Prescriptions Prior to Visit  Medication Sig Dispense Refill  . aspirin EC 81 MG tablet Take 81 mg by mouth daily.    Marland Kitchen ceFEPIme 2 g in dextrose 5 % 50 mL Inject 2 g into the vein daily. 38 Dose 0  . Cholecalciferol 5000 units TABS Take 1 tablet by mouth daily.    . diazepam (VALIUM) 2 MG tablet Take 1 tablet (2 mg total) by mouth every 12 (twelve) hours as needed for anxiety. 20 tablet 0  . diphenhydrAMINE (SOMINEX) 25 MG tablet Take 1 tablet (25 mg total) by mouth at bedtime as needed for itching. Reported on 06/03/2015 15 tablet 0  . Estradiol (VAGIFEM) 10 MCG TABS vaginal tablet INSERT ONE TABLET VAGINALLY EVERY 2 (TWO) DAYS 30 tablet 5  . fluconazole (DIFLUCAN) 100 MG  tablet One daily to treat yeast infection 7 tablet 0  . Heparin Lock Flush (HEPARIN NICU PF) 100 UNIT/ML SOLN injection Inject into the vein. Flush PICC line with 5cc normal saline after IV antibiotic administration and then follow with 3cc Heparin U100 twice daily at 10 and 12pm    . ipratropium (ATROVENT) 0.06 % nasal spray Place 1 spray into both nostrils daily as needed for rhinitis (For rhinitis).    . methocarbamol (ROBAXIN) 500 MG tablet Take 1 tablet (500 mg total) by mouth every 6 (six) hours as needed for muscle spasms. 30 tablet 0  . niacin 500 MG tablet Take 500 mg by mouth 2 (two) times daily with a meal.    . oxyCODONE-acetaminophen (PERCOCET/ROXICET) 5-325 MG tablet Take one tablet by mouth every 6 hours as needed for moderate pain. Max APAP-3gm/24 hrs from all sources 180 tablet 0  . rOPINIRole (REQUIP) 2 MG tablet Take 1 tablet (2 mg total) by mouth 3 (three) times daily. 90 tablet 5  . senna-docusate (SENOKOT-S) 8.6-50 MG tablet Take 1 tablet by mouth at bedtime. 10 tablet 0  . vancomycin 1,000 mg in sodium chloride 0.9 % 250 mL Inject 1,000 mg into the vein daily.    . vitamin B-12 (CYANOCOBALAMIN) 500 MCG tablet Take 500 mcg by mouth daily.    . fluconazole (DIFLUCAN) 200 MG tablet Take 1 tablet (200 mg total) by mouth daily. 35 tablet 0  . oxyCODONE (OXYCONTIN) 20 mg 12 hr tablet Take  1 tablet (20 mg total) by mouth every 12 (twelve) hours. 60 tablet 0   No facility-administered medications prior to visit.    ROS Review of Systems  Constitutional: Negative for chills, activity change, appetite change, fatigue and unexpected weight change.  HENT: Negative for congestion, mouth sores and sinus pressure.   Eyes: Negative for visual disturbance.  Respiratory: Negative for cough and chest tightness.   Gastrointestinal: Negative for nausea and abdominal pain.  Genitourinary: Negative for frequency, difficulty urinating and vaginal pain.  Musculoskeletal: Positive for back pain  and gait problem.  Skin: Negative for pallor and rash.  Neurological: Negative for dizziness, tremors, weakness, numbness and headaches.  Psychiatric/Behavioral: Positive for dysphoric mood and decreased concentration. Negative for suicidal ideas, confusion and sleep disturbance. The patient is nervous/anxious.     Objective:  BP 120/80 mmHg  Pulse 76  Wt 144 lb (65.318 kg)  SpO2 97%  BP Readings from Last 3 Encounters:  08/29/15 120/80  08/08/15 127/69  08/06/15 115/73    Wt Readings from Last 3 Encounters:  08/29/15 144 lb (65.318 kg)  08/08/15 151 lb (68.493 kg)  08/06/15 153 lb 6.4 oz (69.582 kg)    Physical Exam  Constitutional: She appears well-developed. No distress.  HENT:  Head: Normocephalic.  Right Ear: External ear normal.  Left Ear: External ear normal.  Nose: Nose normal.  Mouth/Throat: Oropharynx is clear and moist.  Eyes: Conjunctivae are normal. Pupils are equal, round, and reactive to light. Right eye exhibits no discharge. Left eye exhibits no discharge.  Neck: Normal range of motion. Neck supple. No JVD present. No tracheal deviation present. No thyromegaly present.  Cardiovascular: Normal rate, regular rhythm and normal heart sounds.   Pulmonary/Chest: No stridor. No respiratory distress. She has no wheezes.  Abdominal: Soft. Bowel sounds are normal. She exhibits no distension and no mass. There is no tenderness. There is no rebound and no guarding.  Musculoskeletal: She exhibits tenderness. She exhibits no edema.  Lymphadenopathy:    She has no cervical adenopathy.  Neurological: She displays normal reflexes. No cranial nerve deficit. She exhibits normal muscle tone. Coordination abnormal.  Skin: No rash noted. No erythema.  Psychiatric: Her behavior is normal. Judgment and thought content normal.  thin walker  Lab Results  Component Value Date   WBC 4.1 08/18/2015   HGB 11.0* 08/18/2015   HCT 32.5* 08/18/2015   PLT 114* 08/18/2015   GLUCOSE  113* 08/08/2015   ALT 14 07/01/2015   AST 25 07/01/2015   NA 139 08/08/2015   K 4.0 08/08/2015   CL 102 08/08/2015   CREATININE 1.12* 08/18/2015   BUN 24* 08/18/2015   CO2 24 08/08/2015   TSH 2.88 01/09/2011   INR 1.20 07/25/2015   HGBA1C 6.2* 08/08/2015    No results found.  Assessment & Plan:   There are no diagnoses linked to this encounter. I have discontinued Ms. Galant's oxyCODONE. I am also having her maintain her Estradiol, Cholecalciferol, niacin, diazepam, ceFEPIme 2 g in dextrose 5 % 50 mL, methocarbamol, senna-docusate, diphenhydrAMINE, aspirin EC, vitamin B-12, ipratropium, (vancomycin 1,000 mg in sodium chloride 0.9 % 250 mL), heparin NICU PF, oxyCODONE-acetaminophen, rOPINIRole, and fluconazole.  No orders of the defined types were placed in this encounter.     Follow-up: No Follow-up on file.  Walker Kehr, MD

## 2015-08-29 NOTE — Progress Notes (Signed)
Pre visit review using our clinic review tool, if applicable. No additional management support is needed unless otherwise documented below in the visit note. 

## 2015-08-29 NOTE — Assessment & Plan Note (Signed)
8 wks of Diflucan to get rid of it

## 2015-08-29 NOTE — Assessment & Plan Note (Signed)
On Oxy  Potential benefits of a long term opioids use as well as potential risks (i.e. addiction risk, apnea etc) and complications (i.e. Somnolence, constipation and others) were explained to the patient and were aknowledged.  4/17 LBP is worse

## 2015-08-29 NOTE — Assessment & Plan Note (Signed)
On B12 

## 2015-08-30 ENCOUNTER — Telehealth: Payer: Self-pay

## 2015-08-30 NOTE — Telephone Encounter (Signed)
Notification received from pharmacy stating that PA was required on Oxycontin 10 mg 12hr controlled released tab because MD wrote for pt to take TID instead of BID.  Please verify directions, should pt take 12hr tablet three times daily or twice daily? Thanks

## 2015-08-30 NOTE — Telephone Encounter (Signed)
Yes tid is correct Thx

## 2015-09-02 DIAGNOSIS — Z5181 Encounter for therapeutic drug level monitoring: Secondary | ICD-10-CM | POA: Diagnosis not present

## 2015-09-02 DIAGNOSIS — M545 Low back pain: Secondary | ICD-10-CM | POA: Diagnosis not present

## 2015-09-02 DIAGNOSIS — M4627 Osteomyelitis of vertebra, lumbosacral region: Secondary | ICD-10-CM | POA: Diagnosis not present

## 2015-09-02 DIAGNOSIS — Z452 Encounter for adjustment and management of vascular access device: Secondary | ICD-10-CM | POA: Diagnosis not present

## 2015-09-02 DIAGNOSIS — M4647 Discitis, unspecified, lumbosacral region: Secondary | ICD-10-CM | POA: Diagnosis not present

## 2015-09-02 DIAGNOSIS — G894 Chronic pain syndrome: Secondary | ICD-10-CM | POA: Diagnosis not present

## 2015-09-02 LAB — FUNGAL ORGANISM REFLEX

## 2015-09-02 LAB — FUNGUS CULTURE WITH STAIN

## 2015-09-02 LAB — FUNGUS CULTURE RESULT

## 2015-09-02 NOTE — Telephone Encounter (Signed)
PA initiated via CoverMyMeds key [id number]

## 2015-09-02 NOTE — Telephone Encounter (Signed)
I will submit the original PA and if it rejects, they will give specific reason and alternative prescribing information.  Key [id number]

## 2015-09-02 NOTE — Telephone Encounter (Signed)
Are they going to cover Oxycontin 20 mg #30 and 10 mg #30 together? Thx

## 2015-09-02 NOTE — Telephone Encounter (Signed)
Insurance company is requesting a quantity limit exception - Can the prescribed medication dosage be increase and frequency be reduced to BID for the same therapeutic effect?

## 2015-09-03 ENCOUNTER — Telehealth: Payer: Self-pay

## 2015-09-03 NOTE — Telephone Encounter (Signed)
Called to inform that the medication has been approved for the Oxycodone 10mg   [id number] if you have questions. Thank you

## 2015-09-03 NOTE — Telephone Encounter (Signed)
Forwarding to [place] for [employer]...Diane Abbott

## 2015-09-03 NOTE — Telephone Encounter (Signed)
Noted. See phone note 08/30/2015

## 2015-09-03 NOTE — Telephone Encounter (Signed)
PA APPROVED through 09/01/2016, pharmacy to be notified via CoverMyMeds

## 2015-09-04 ENCOUNTER — Other Ambulatory Visit: Payer: Self-pay

## 2015-09-04 DIAGNOSIS — I719 Aortic aneurysm of unspecified site, without rupture: Secondary | ICD-10-CM

## 2015-09-04 DIAGNOSIS — N289 Disorder of kidney and ureter, unspecified: Secondary | ICD-10-CM

## 2015-09-04 DIAGNOSIS — I77810 Thoracic aortic ectasia: Secondary | ICD-10-CM

## 2015-09-04 DIAGNOSIS — I351 Nonrheumatic aortic (valve) insufficiency: Secondary | ICD-10-CM

## 2015-09-09 ENCOUNTER — Ambulatory Visit (HOSPITAL_COMMUNITY): Payer: Medicare Other

## 2015-09-09 DIAGNOSIS — M545 Low back pain: Secondary | ICD-10-CM | POA: Diagnosis not present

## 2015-09-09 DIAGNOSIS — Z5181 Encounter for therapeutic drug level monitoring: Secondary | ICD-10-CM | POA: Diagnosis not present

## 2015-09-09 DIAGNOSIS — M4627 Osteomyelitis of vertebra, lumbosacral region: Secondary | ICD-10-CM | POA: Diagnosis not present

## 2015-09-09 DIAGNOSIS — G894 Chronic pain syndrome: Secondary | ICD-10-CM | POA: Diagnosis not present

## 2015-09-09 DIAGNOSIS — M4647 Discitis, unspecified, lumbosacral region: Secondary | ICD-10-CM | POA: Diagnosis not present

## 2015-09-09 DIAGNOSIS — Z452 Encounter for adjustment and management of vascular access device: Secondary | ICD-10-CM | POA: Diagnosis not present

## 2015-09-12 ENCOUNTER — Encounter: Payer: Self-pay | Admitting: Internal Medicine

## 2015-09-16 ENCOUNTER — Encounter: Payer: Self-pay | Admitting: Internal Medicine

## 2015-09-16 ENCOUNTER — Ambulatory Visit (INDEPENDENT_AMBULATORY_CARE_PROVIDER_SITE_OTHER): Payer: Medicare Other | Admitting: Internal Medicine

## 2015-09-16 ENCOUNTER — Telehealth: Payer: Self-pay

## 2015-09-16 VITALS — BP 121/80 | HR 80 | Temp 97.6°F | Wt 144.0 lb

## 2015-09-16 DIAGNOSIS — M4626 Osteomyelitis of vertebra, lumbar region: Secondary | ICD-10-CM | POA: Diagnosis not present

## 2015-09-16 DIAGNOSIS — G894 Chronic pain syndrome: Secondary | ICD-10-CM | POA: Diagnosis not present

## 2015-09-16 DIAGNOSIS — S22000D Wedge compression fracture of unspecified thoracic vertebra, subsequent encounter for fracture with routine healing: Secondary | ICD-10-CM | POA: Diagnosis not present

## 2015-09-16 DIAGNOSIS — G062 Extradural and subdural abscess, unspecified: Secondary | ICD-10-CM | POA: Diagnosis not present

## 2015-09-16 DIAGNOSIS — M4627 Osteomyelitis of vertebra, lumbosacral region: Secondary | ICD-10-CM | POA: Diagnosis not present

## 2015-09-16 DIAGNOSIS — M4647 Discitis, unspecified, lumbosacral region: Secondary | ICD-10-CM | POA: Diagnosis not present

## 2015-09-16 DIAGNOSIS — Z452 Encounter for adjustment and management of vascular access device: Secondary | ICD-10-CM | POA: Diagnosis not present

## 2015-09-16 DIAGNOSIS — Z5181 Encounter for therapeutic drug level monitoring: Secondary | ICD-10-CM | POA: Diagnosis not present

## 2015-09-16 DIAGNOSIS — M545 Low back pain: Secondary | ICD-10-CM | POA: Diagnosis not present

## 2015-09-16 NOTE — Telephone Encounter (Signed)
Spoke with Stanton Kidney at [facility] and gave Dr. Henreitta Leber verbal order to continue vancomycin and cefepime for one week with a stop date of 05/22 and to add an CRP to weekly labs.  Rodman Key, LPN

## 2015-09-16 NOTE — Progress Notes (Signed)
   Subjective:    Patient ID: [name], female    DOB: 10-14-1934, 81 y.o.   MRN: [id number]  HPI Here for follow up of discitis with osteomyelitis.   81 yo female with RA, adrenal insufficiency who presented with back pain.  Initially had been intermittent starting in January then in March she was unable to get out of bed and came to ED.  MRI with discitis and osteomyelitis.  Pain was severe.  She now has been on vancomycin and cefepime for about 8 weeks.  Her pain level is much improved, though not gone completely.  She is walking fine and no fever, no chills. No issues with picc line.  Here with daughter.  No organism grew and asking why antbiotics were started prior to aspiration.  Asking about surgery for compression fracture.  Saw Dr. Kathyrn Sheriff in the hospital, has previously seen Dr. Joya Salm.     Review of Systems  Constitutional: Negative for fatigue.  Musculoskeletal: Positive for back pain.       Much improved, some radiation to left hip.  Has noted a new protrusion in back.        Objective:   Physical Exam  Constitutional: She appears well-developed and well-nourished.  Eyes: No scleral icterus.  Musculoskeletal:  A bony protrusion noted, is not tender, not warm and not fluctuant.  Around the L1 area.    Skin: No rash noted.          Assessment & Plan:

## 2015-09-16 NOTE — Assessment & Plan Note (Signed)
This may be part of the new (according to the patient) bony protrusion.  Will refer her back to neurosurgery (Botero/Nundkumar) once her antibiotics are completed.

## 2015-09-16 NOTE — Assessment & Plan Note (Signed)
I am going to recheck the MRI to be sure it has resolved and will stop antibiotics if ok.

## 2015-09-22 ENCOUNTER — Ambulatory Visit
Admission: RE | Admit: 2015-09-22 | Discharge: 2015-09-22 | Disposition: A | Discharge status: Home / Self Care | Payer: Medicare Other | Source: Ambulatory Visit | Attending: Internal Medicine | Admitting: Internal Medicine

## 2015-09-22 DIAGNOSIS — S33140A Subluxation of L4/L5 lumbar vertebra, initial encounter: Secondary | ICD-10-CM | POA: Diagnosis not present

## 2015-09-22 DIAGNOSIS — M4626 Osteomyelitis of vertebra, lumbar region: Secondary | ICD-10-CM

## 2015-09-22 MED ORDER — GADOBENATE DIMEGLUMINE 529 MG/ML IV SOLN
13.0000 mL | Freq: Once | INTRAVENOUS | Status: AC | PRN
Start: 2015-09-22 — End: 2015-09-22
  Administered 2015-09-22: 13 mL via INTRAVENOUS

## 2015-09-23 ENCOUNTER — Telehealth: Payer: Self-pay | Admitting: *Deleted

## 2015-09-23 DIAGNOSIS — M545 Low back pain: Secondary | ICD-10-CM | POA: Diagnosis not present

## 2015-09-23 DIAGNOSIS — Z452 Encounter for adjustment and management of vascular access device: Secondary | ICD-10-CM | POA: Diagnosis not present

## 2015-09-23 DIAGNOSIS — G894 Chronic pain syndrome: Secondary | ICD-10-CM | POA: Diagnosis not present

## 2015-09-23 DIAGNOSIS — M4627 Osteomyelitis of vertebra, lumbosacral region: Secondary | ICD-10-CM | POA: Diagnosis not present

## 2015-09-23 DIAGNOSIS — M4647 Discitis, unspecified, lumbosacral region: Secondary | ICD-10-CM | POA: Diagnosis not present

## 2015-09-23 DIAGNOSIS — Z5181 Encounter for therapeutic drug level monitoring: Secondary | ICD-10-CM | POA: Diagnosis not present

## 2015-09-23 NOTE — Telephone Encounter (Deleted)
-----   Message from Thayer Headings, MD sent at 09/23/2015 10:45 AM EDT ----- Please let her know that the MRI looks great.  All of the abscess is gone and there is only a very small amount of edema left in the area so I would like to continue the antibiotics for that for 10 more days to completely clear the remaining area and then stop, pull the picc line.   Then she can go back to the neurosurgery office (where she has been before, saw Dr. Casimer Bilis in the hospital) to help deal with the compression fracture she has (she already is aware). We don't need to see her back unless something changes.  thanks

## 2015-09-23 NOTE — Telephone Encounter (Signed)
-----   Message from Thayer Headings, MD sent at 09/23/2015 10:45 AM EDT ----- Please let her know that the MRI looks great.  All of the abscess is gone and there is only a very small amount of edema left in the area so I would like to continue the antibiotics for that for 10 more days to completely clear the remaining area and then stop, pull the picc line.   Then she can go back to the neurosurgery office (where she has been before, saw Dr. Casimer Bilis in the hospital) to help deal with the compression fracture she has (she already is aware). We don't need to see her back unless something changes.  thanks

## 2015-09-23 NOTE — Telephone Encounter (Addendum)
Verbal order given to [facility] at [place] to continue IV antibiotics 10 more days then stop and pull picc per Dr. Linus Salmons.  Patient is asking about the status of her referral to Neurosurgery. Landis Gandy, RN

## 2015-09-27 ENCOUNTER — Other Ambulatory Visit: Payer: Self-pay

## 2015-09-27 DIAGNOSIS — I712 Thoracic aortic aneurysm, without rupture: Secondary | ICD-10-CM

## 2015-09-27 DIAGNOSIS — I7121 Aneurysm of the ascending aorta, without rupture: Secondary | ICD-10-CM

## 2015-09-27 DIAGNOSIS — I719 Aortic aneurysm of unspecified site, without rupture: Secondary | ICD-10-CM

## 2015-09-30 DIAGNOSIS — M4627 Osteomyelitis of vertebra, lumbosacral region: Secondary | ICD-10-CM | POA: Diagnosis not present

## 2015-09-30 DIAGNOSIS — M4647 Discitis, unspecified, lumbosacral region: Secondary | ICD-10-CM | POA: Diagnosis not present

## 2015-09-30 DIAGNOSIS — Z5181 Encounter for therapeutic drug level monitoring: Secondary | ICD-10-CM | POA: Diagnosis not present

## 2015-09-30 DIAGNOSIS — G894 Chronic pain syndrome: Secondary | ICD-10-CM | POA: Diagnosis not present

## 2015-09-30 DIAGNOSIS — Z452 Encounter for adjustment and management of vascular access device: Secondary | ICD-10-CM | POA: Diagnosis not present

## 2015-09-30 DIAGNOSIS — M545 Low back pain: Secondary | ICD-10-CM | POA: Diagnosis not present

## 2015-10-03 ENCOUNTER — Other Ambulatory Visit: Payer: Self-pay | Admitting: *Deleted

## 2015-10-03 DIAGNOSIS — I712 Thoracic aortic aneurysm, without rupture, unspecified: Secondary | ICD-10-CM

## 2015-10-04 ENCOUNTER — Telehealth: Payer: Self-pay | Admitting: *Deleted

## 2015-10-04 DIAGNOSIS — Z452 Encounter for adjustment and management of vascular access device: Secondary | ICD-10-CM | POA: Diagnosis not present

## 2015-10-04 DIAGNOSIS — M4647 Discitis, unspecified, lumbosacral region: Secondary | ICD-10-CM | POA: Diagnosis not present

## 2015-10-04 DIAGNOSIS — M4627 Osteomyelitis of vertebra, lumbosacral region: Secondary | ICD-10-CM | POA: Diagnosis not present

## 2015-10-04 DIAGNOSIS — G894 Chronic pain syndrome: Secondary | ICD-10-CM | POA: Diagnosis not present

## 2015-10-04 DIAGNOSIS — M545 Low back pain: Secondary | ICD-10-CM | POA: Diagnosis not present

## 2015-10-04 DIAGNOSIS — Z5181 Encounter for therapeutic drug level monitoring: Secondary | ICD-10-CM | POA: Diagnosis not present

## 2015-10-04 NOTE — Telephone Encounter (Signed)
PCP reviewed 09/30/15 labs from [facility]. PCP wants to make sure she is seeing a hematologist due to low WBCs/platelets.  I called pt. No answer/ Left mess for patient to call back.

## 2015-10-07 ENCOUNTER — Ambulatory Visit
Admission: RE | Admit: 2015-10-07 | Discharge: 2015-10-07 | Disposition: A | Discharge status: Home / Self Care | Payer: Medicare Other | Source: Ambulatory Visit | Attending: Surgery | Admitting: Surgery

## 2015-10-07 ENCOUNTER — Telehealth: Payer: Self-pay

## 2015-10-07 DIAGNOSIS — I7121 Aneurysm of the ascending aorta, without rupture: Secondary | ICD-10-CM

## 2015-10-07 DIAGNOSIS — M4647 Discitis, unspecified, lumbosacral region: Secondary | ICD-10-CM | POA: Diagnosis not present

## 2015-10-07 DIAGNOSIS — I712 Thoracic aortic aneurysm, without rupture: Secondary | ICD-10-CM | POA: Diagnosis not present

## 2015-10-07 DIAGNOSIS — M545 Low back pain: Secondary | ICD-10-CM | POA: Diagnosis not present

## 2015-10-07 DIAGNOSIS — Z452 Encounter for adjustment and management of vascular access device: Secondary | ICD-10-CM | POA: Diagnosis not present

## 2015-10-07 MED ORDER — IOPAMIDOL (ISOVUE-370) INJECTION 76%
50.0000 mL | Freq: Once | INTRAVENOUS | Status: AC | PRN
  Administered 2015-10-07: 50 mL via INTRAVENOUS

## 2015-10-07 NOTE — Telephone Encounter (Signed)
Home Health Cert/Plan of Care received (08/10/2015 - 10/08/2015) and placed on MD's desk for signature

## 2015-10-08 ENCOUNTER — Other Ambulatory Visit: Payer: Self-pay | Admitting: *Deleted

## 2015-10-08 MED ORDER — ROPINIROLE HCL 2 MG PO TABS: 2.0000 mg | ORAL_TABLET | Freq: Three times a day (TID) | ORAL | Status: DC

## 2015-10-09 ENCOUNTER — Encounter: Payer: Self-pay | Admitting: Surgery

## 2015-10-09 ENCOUNTER — Ambulatory Visit (INDEPENDENT_AMBULATORY_CARE_PROVIDER_SITE_OTHER): Payer: Medicare Other | Admitting: Surgery

## 2015-10-09 VITALS — BP 150/100 | HR 90 | Resp 20 | Ht 60.0 in | Wt 144.0 lb

## 2015-10-09 DIAGNOSIS — I7122 Aneurysm of the aortic arch, without rupture: Secondary | ICD-10-CM

## 2015-10-09 DIAGNOSIS — I712 Thoracic aortic aneurysm, without rupture: Secondary | ICD-10-CM | POA: Diagnosis not present

## 2015-10-09 DIAGNOSIS — I719 Aortic aneurysm of unspecified site, without rupture: Secondary | ICD-10-CM | POA: Diagnosis not present

## 2015-10-09 DIAGNOSIS — I77819 Aortic ectasia, unspecified site: Secondary | ICD-10-CM | POA: Diagnosis not present

## 2015-10-09 NOTE — Telephone Encounter (Signed)
Paperwork signed, faxed, copy sent to scan 

## 2015-10-11 ENCOUNTER — Telehealth: Payer: Self-pay | Admitting: Internal Medicine

## 2015-10-11 NOTE — Telephone Encounter (Signed)
Left message advising patient to call back if she is ok with dr plotnikov arranging for a hematologist to see her

## 2015-10-11 NOTE — Telephone Encounter (Signed)
Pt informed- she states she was on 2 different antibiotics at the time these labs were drawn. She requests to see Dr. Earlie Server.Please advise.

## 2015-10-11 NOTE — Telephone Encounter (Signed)
Dr Julien Nordmann is not a hematologist. Can we sch w/someone else? Let me know Thx

## 2015-10-11 NOTE — Telephone Encounter (Signed)
Author Note Status Last Update User Last Update Date/Time   Ander Slade, RN Signed Ander Slade, RN 10/11/2015 2:32 PM  Telephone Encounter   Expand All Collapse All    Left message advising patient to call back if she is ok with dr plotnikov arranging for a hematologist to see her   Pt call back stated that she is okey with it as long as it is with the [facility].

## 2015-10-13 ENCOUNTER — Encounter: Payer: Self-pay | Admitting: Surgery

## 2015-10-13 NOTE — Progress Notes (Signed)
HPI:  The patient is a 81 year old woman with rheumatoid arthritis, fibromyalgia, IBS, DM and multiple other medial problems  who underwent Bentall procedure using a composite pericardial valve/conduit and replacement of the ascending aorta and proximal aortic arch on 09/29/2012. Her ascending aorta was 5.8 cm at that time. The aorta had increased in size from 4.5 cm in 2009. She was getting some chest pains and back pain, but it was difficult to tell if this was due to her aneurysm or due to her fibromyalgia and arthritis. The distal anastomosis was done just proximal to the innominate artery where the aorta appeared to come back to more normal size of about 3 cm. She had a follow up CT of the chest on 01/27/2013 that showed the aortic arch to have a diameter beyond the repair of 4.8 cm and the descending aorta was 3.7 cm. A follow up scan on 02/14/2014 showed the maximal diameter of the aortic arch to be 4.9 cm. A CTA on 03/25/2015 showed the diameter of the aortic arch to have increased further to 5.3 cm. I last saw her in 04/2016 and decided to follow this aneurysm a while longer given her age and medical problems. In January she started having back pain that progressed and she had an MRI showing discitis at the L1-2 space, osteomyelitis and epidural phlegmon. She was treated with antibiotics and had a disc aspiration but nothing grew out. She continues to have some back pain but also some left sided chest pain that she is concerned about being related to the aneurysm.  Current Outpatient Prescriptions  Medication Sig Dispense Refill  . aspirin EC 81 MG tablet Take 81 mg by mouth daily.    . Estradiol (VAGIFEM) 10 MCG TABS vaginal tablet INSERT ONE TABLET VAGINALLY EVERY 2 (TWO) DAYS 30 tablet 5  . oxyCODONE (OXYCONTIN) 10 mg 12 hr tablet Take 1 tablet (10 mg total) by mouth 3 (three) times daily. Please fill on or after 09/28/15 90 tablet 0  . rOPINIRole (REQUIP) 2 MG tablet Take 1 tablet (2 mg  total) by mouth 3 (three) times daily. 90 tablet 5  . vitamin B-12 (CYANOCOBALAMIN) 500 MCG tablet Take 500 mcg by mouth daily.     No current facility-administered medications for this visit.     Physical Exam: BP 150/100 mmHg  Pulse 90  Resp 20  Ht 5' (1.524 m)  Wt 144 lb (65.318 kg)  BMI 28.12 kg/m2  SpO2 94% She looks fatigued Cardiac exam shows a regular rate and rhythm with a 2/6 systolic murmur at the RSB. Lungs are clear.  Diagnostic Tests:  CLINICAL DATA: Follow-up thoracic abdominal aortic aneurysm. Prior valve replacement.  EXAM: CT ANGIOGRAPHY CHEST, ABDOMEN AND PELVIS  TECHNIQUE: Multidetector CT imaging through the chest, abdomen and pelvis was performed using the standard protocol during bolus administration of intravenous contrast. Multiplanar reconstructed images and MIPs were obtained and reviewed to evaluate the vascular anatomy.  CONTRAST: 50 cc Isovue 370 IV.  COMPARISON: Chest CT 03/25/2015. CT abdomen and pelvis 08/24/2012.  FINDINGS: CTA CHEST FINDINGS  Mediastinum/Nodes: Changes of prior aortic valve replacement and tube graft repair of the ascending thoracic aorta. Continued aneurysmal dilatation of the distal ascending aorta, measuring 5.2 cm maximally, stable. Proximal descending thoracic aorta measures 3.8 cm compared to 4.0 cm previously. There is tortuosity of the descending thoracic aorta which is ectatic. Heart is normal size. No dissection. No adenopathy.  Lungs/Pleura: Lungs are clear. No focal airspace  opacities or suspicious nodules. No effusions.  Musculoskeletal: Chest wall soft tissues are unremarkable. No acute bony abnormality or focal bone lesion. Degenerative changes throughout the thoracic spine.  Review of the MIP images confirms the above findings.  CTA ABDOMEN AND PELVIS FINDINGS  Hepatobiliary: No focal hepatic abnormality. Prior cholecystectomy.  Pancreas: No focal abnormality or ductal  dilatation.  Spleen: Normal size. Scattered small hypodensities throughout the liver, likely small cysts.  Adrenals/Urinary Tract: New small enhancing lesion off the lower pole of the right kidney measures 9 mm concerning for a small solid renal lesion. No hydronephrosis. Adrenal glands and urinary bladder grossly unremarkable.  Stomach/Bowel: Stomach, large and small bowel grossly unremarkable.  Vascular/Lymphatic: No evidence of abdominal aortic aneurysm. Maximum aortic diameter 2.7 cm proximally. Mesenteric vessels and single renal arteries are widely patent. No adenopathy.  Reproductive: Prior hysterectomy. No adnexal masses.  Other: No free fluid or free air.  Musculoskeletal: Severe compression fracture of the L1 vertebral body, progressed since prior study.  Review of the MIP images confirms the above findings.  IMPRESSION: Prior tube graft repair of the ascending thoracic aorta with stable aneurysmal dilatation of the distal ascending aorta, measuring 5.2 cm. Stable mild aneurysmal dilatation of the proximal descending thoracic aorta, and ectasia and tortuosity of the descending thoracic aorta. No evidence of abdominal aortic aneurysm.  Tiny exophytic lesion off the lower pole of the right kidney which appears to be enhancing and solid, 9 mm. This could be further evaluated with MRI or renal protocol CT.   Electronically Signed  By: Rolm Baptise M.D.  On: 10/07/2015 13:19   CLINICAL DATA: Discitis osteomyelitis. Spontaneous compression fracture diagnosed March 2017. Antibiotic therapy, surveillance follow up.  EXAM: MRI LUMBAR SPINE WITHOUT AND WITH CONTRAST  TECHNIQUE: Multiplanar and multiecho pulse sequences of the lumbar spine were obtained without and with intravenous contrast.  CONTRAST: 3mL MULTIHANCE GADOBENATE DIMEGLUMINE 529 MG/ML IV SOLN  COMPARISON: 07/24/2015  FINDINGS: Segmentation: The lowest lumbar type  non-rib-bearing vertebra is labeled as L5.  Alignment: 6 mm anterolisthesis at T12-L1 and 4 mm retrolisthesis at L1- 2. Some of this appearance may be due to bony retropulsion. 5 mm retrolisthesis at L2- 3 and 3 mm anterolisthesis at L4-5.  Vertebrae: There is further collapse of L1, now with near complete collapse of the vertebral body anteriorly and eccentric to the right. Posteriorly the vertebral body measures about 2.4 cm, previously 2.7 cm.  There continues to be diffuse enhancement in the L1 and L2 vertebral bodies. Further subsidence along the anterior superior endplate of L2 compatible with osteomyelitis or compression. There continues to be edema and enhancement in the intervertebral disc space. However, the small posterior epidural abscess near the midline shown on the prior exam is no longer visible, and the epidural phlegmon has essentially resolved.  There does appear to be abnormal enhancement in the left psoas muscle favoring phlegmon. I do not see a well-defined psoas abscess.  There is a hemangioma in the T12 vertebral body anteriorly. Type 1 degenerative endplate findings posteriorly at the L2-3 level.  Conus medullaris: Extends to the upper L2 level and appears normal.  Paraspinal and other soft tissues: Probable phlegmon/ inflammation in the left psoas muscle at the L1-2 level.  Disc levels:  T12-L1: Mild bilateral facet arthropathy, right greater the left due, due to disc uncovering/anterolisthesis and facet arthropathy.  L1-2: Mild bilateral foraminal stenosis and borderline central narrowing of the thecal sac due to the disc bulge, subluxation, and facet arthropathy at this  level.  L2-3: Mild central stenosis with mild bilateral foraminal stenosis and mild left subarticular lateral recess stenosis due to facet arthropathy, ligamentum flavum redundancy, and disc bulge.  L3-4: No impingement. Disc bulge and facet arthropathy  noted.  L4-5: Moderate central narrowing of the thecal sac with mild bilateral subarticular lateral recess stenosis, borderline bilateral foraminal stenosis, and mild displacement of the right L4 nerve in the lateral extraforaminal space due to disc uncovering, facet arthropathy, and mild disc bulge.  L5-S1: No impingement. Bilateral facet arthropathy.  IMPRESSION: 1. Continued discitis-osteomyelitis at L1- 2, with diffuse vertebral enhancement and some further collapse of L1 and of the superior endplate of L2. However, the epidural abscess and epidural phlegmon have resolved, and in comparison to the diffuse paraspinal edema and enhancement previously present, there is only mild left psoas enhancement remaining. 2. Lumbar spondylosis, degenerative disc disease, and subluxations result in moderate impingement at L4-5 and mild impingement at T12-L1, L1- 2, and L2- 3, as described above.   Electronically Signed  By: Van Clines M.D.  On: 09/22/2015 15:57   Impression:  The aneurysm of the distal ascending aorta and proximal arch is stable at 5.2 cm. Her lumbar MR from 09/22/15 shows continued discitis/osteomyelitis at L1-2 with resolution of the epidural abscess/phlegmon. Treatment of her aneurysm would require debranching of the aortic arch and endovascular stent grafting. The aneurysm appears stable and is below the 5.5 cm threshold but has enlarged over time and I think it will probably enlarge further. I think it is important to be sure that there is no sign of spine infection before considering a prosthetic endograft to minimize the risk of graft infection. I think we should follow her for a while to be sure the infection is completely resolved given her recent MRI findings and continued back pain. It is not clear how she got this infection. Her BP is elevated today but she says that her BP at home is usually well-controlled.   Plan:  I will see her back in six  months with a CTA of the chest.   Gaye Pollack, MD [facility] [phone]

## 2015-10-14 ENCOUNTER — Telehealth: Payer: Self-pay | Admitting: Internal Medicine

## 2015-10-14 DIAGNOSIS — I824Y9 Acute embolism and thrombosis of unspecified deep veins of unspecified proximal lower extremity: Secondary | ICD-10-CM

## 2015-10-14 NOTE — Telephone Encounter (Signed)
OK. Thx

## 2015-10-14 NOTE — Telephone Encounter (Signed)
Pt called in and Dr Camila Li put a referral in for her and she would like to go to [place], [place]

## 2015-10-22 ENCOUNTER — Encounter: Payer: Self-pay | Admitting: Hematology

## 2015-10-22 ENCOUNTER — Telehealth: Payer: Self-pay | Admitting: Hematology

## 2015-10-22 NOTE — Telephone Encounter (Signed)
Telephone call to patient on 6/19 to make aware of appointment date and time with Dr. Irene Limbo, no answer. Called patient's son with the appointment date and time, 7/3 at [place] to have mom arrive 30 minutes early. Letter mailed to patient and faxed to referring.

## 2015-10-23 ENCOUNTER — Ambulatory Visit: Payer: Medicare Other | Admitting: Surgery

## 2015-10-30 ENCOUNTER — Telehealth: Payer: Self-pay | Admitting: Hematology

## 2015-10-30 NOTE — Telephone Encounter (Signed)
Lt mess regarding rescheduling 7/3 appt,

## 2015-10-31 ENCOUNTER — Telehealth: Payer: Self-pay | Admitting: Hematology

## 2015-10-31 NOTE — Telephone Encounter (Signed)
PT CONFIRMD NEW APPT DATE AND TIME

## 2015-11-04 ENCOUNTER — Ambulatory Visit: Payer: Medicare Other | Admitting: Hematology

## 2015-11-11 ENCOUNTER — Ambulatory Visit (HOSPITAL_BASED_OUTPATIENT_CLINIC_OR_DEPARTMENT_OTHER): Payer: Medicare Other | Admitting: Hematology

## 2015-11-11 ENCOUNTER — Ambulatory Visit (HOSPITAL_BASED_OUTPATIENT_CLINIC_OR_DEPARTMENT_OTHER): Payer: Medicare Other

## 2015-11-11 VITALS — BP 133/55 | HR 79 | Temp 97.8°F | Resp 17 | Ht 60.0 in | Wt 150.1 lb

## 2015-11-11 DIAGNOSIS — D72819 Decreased white blood cell count, unspecified: Secondary | ICD-10-CM | POA: Diagnosis not present

## 2015-11-11 DIAGNOSIS — D6959 Other secondary thrombocytopenia: Secondary | ICD-10-CM | POA: Diagnosis not present

## 2015-11-11 DIAGNOSIS — E538 Deficiency of other specified B group vitamins: Secondary | ICD-10-CM | POA: Diagnosis not present

## 2015-11-11 DIAGNOSIS — N189 Chronic kidney disease, unspecified: Secondary | ICD-10-CM

## 2015-11-11 DIAGNOSIS — T50905A Adverse effect of unspecified drugs, medicaments and biological substances, initial encounter: Principal | ICD-10-CM

## 2015-11-11 LAB — CBC & DIFF AND RETIC
BASO%: 1 % (ref 0.0–2.0)
Basophils Absolute: 0 10*3/uL (ref 0.0–0.1)
EOS%: 10 % — AB (ref 0.0–7.0)
Eosinophils Absolute: 0.4 10*3/uL (ref 0.0–0.5)
HEMATOCRIT: 32.1 % — AB (ref 34.8–46.6)
HGB: 10.6 g/dL — ABNORMAL LOW (ref 11.6–15.9)
Immature Retic Fract: 6.6 % (ref 1.60–10.00)
LYMPH%: 34.4 % (ref 14.0–49.7)
MCH: 31.7 pg (ref 25.1–34.0)
MCHC: 33 g/dL (ref 31.5–36.0)
MCV: 96.1 fL (ref 79.5–101.0)
MONO#: 0.3 10*3/uL (ref 0.1–0.9)
MONO%: 8.7 % (ref 0.0–14.0)
NEUT%: 45.9 % (ref 38.4–76.8)
NEUTROS ABS: 1.8 10*3/uL (ref 1.5–6.5)
Platelets: 134 10*3/uL — ABNORMAL LOW (ref 145–400)
RBC: 3.34 10*6/uL — AB (ref 3.70–5.45)
RDW: 13.6 % (ref 11.2–14.5)
RETIC %: 1.15 % (ref 0.70–2.10)
Retic Ct Abs: 38.41 10*3/uL (ref 33.70–90.70)
WBC: 3.9 10*3/uL (ref 3.9–10.3)
lymph#: 1.3 10*3/uL (ref 0.9–3.3)
nRBC: 0 % (ref 0–0)

## 2015-11-11 LAB — COMPREHENSIVE METABOLIC PANEL
ALT: <9 U/L (ref 0–55)
ANION GAP: 9 meq/L (ref 3–11)
AST: 18 U/L (ref 5–34)
Albumin: 3.8 g/dL (ref 3.5–5.0)
Alkaline Phosphatase: 80 U/L (ref 40–150)
BUN: 28.7 mg/dL — AB (ref 7.0–26.0)
CALCIUM: 9.7 mg/dL (ref 8.4–10.4)
CHLORIDE: 108 meq/L (ref 98–109)
CO2: 28 mEq/L (ref 22–29)
Creatinine: 1.3 mg/dL — ABNORMAL HIGH (ref 0.6–1.1)
EGFR: 38 mL/min/{1.73_m2} — ABNORMAL LOW (ref >90)
Glucose: 92 mg/dl (ref 70–140)
POTASSIUM: 5 meq/L (ref 3.5–5.1)
Sodium: 144 mEq/L (ref 136–145)
Total Bilirubin: 0.38 mg/dL (ref 0.20–1.20)
Total Protein: 7.5 g/dL (ref 6.4–8.3)

## 2015-11-11 LAB — CHCC SMEAR

## 2015-11-11 NOTE — Progress Notes (Signed)
Marland Kitchen    HEMATOLOGY/ONCOLOGY CONSULTATION NOTE  Date of Service: 11/11/2015  Patient Care Team: Cassandria Anger, MD as PCP - General Carroll Kinds, NP as Nurse Practitioner (Nurse Practitioner) Gaye Pollack, MD as Attending Physician (Cardiothoracic Surgery) Curt Bears, MD (Hematology and Oncology) Larey Dresser, MD as Attending Physician (Cardiology) Lafayette Dragon, MD as Attending Physician (Gastroenterology) Charlotte Crumb, MD as Consulting Physician (Orthopedic Surgery) Donato Heinz, MD as Consulting Physician (Nephrology)  CHIEF COMPLAINTS/PURPOSE OF CONSULTATION:  Anemia Thrombocytopenia Leukopenia  HISTORY OF PRESENTING ILLNESS:  Diane Abbott is a wonderful 81 y.o. female who has been referred to [place] by Dr .Alain Marion, Evie Lacks, MD  for evaluation and management of leucopenia/thrombocytopenia.  Patient has multiple medical comorbidities including hypertension, dyslipidemia, diabetes, irritable bowel syndrome, history of DVT and PE, rheumatoid arthritis, fibromyalgia , B12 deficiency, chronic kidney disease who has had significant issues back pain and was admitted in March 2017 with acute lumbar discitis/osteomyelitis with possible epidural and psoas abscess. He was seen by neurosurgery and infectious disease. Was treated empirically with vancomycin and cefepime. Had a PICC line placed for home antibiotics and was discharged with skin nursing facility. Patient was being followed by Dr. Linus Salmons for ID management.  Labs from 10/01/2015 done by home care while the patient was on vancomycin and cefepime showed leukopenia with a WBC count of 2.7k with an ANC of 1.6k , thrombocytopenia of 84k with a hemoglobin of 13.8 with an MCV of 94.  Labs on 09/23/2015 showed WBC count of 3.6k with a hemoglobin of 10.6 and platelet count of 147k.  Heart leukopenia and thrombocytopenia appeared to be likely related to her antibiotics vancomycin and cefepime.  Labs done today on  11/11/2015 showed resolution of her leukopenia with a WBC count of 3.9k and did let count of 134k. Hemoglobin is 10.6 with an MCV of 96.  Patient notes he is completed antibiotics and has returned home from her SNF. She continues to follow with infectious disease.  Follows with her primary care physician for management of multiple other medical comorbidities.   MEDICAL HISTORY:  Past Medical History  Diagnosis Date  . Fibromyalgia   . Pulmonary embolism ([place])   . RA (rheumatoid arthritis) ([place])   . Vitamin B 12 deficiency   . Vitamin D deficiency   . Osteoarthritis   . Osteopenia   . Adrenal insufficiency ([place])   . IBS (irritable bowel syndrome)   . History of blood clots 2008    below knee post trauma. Dilated [facility] no clotting disorder felt to be present.  . Diabetes mellitus type II     pt denies this 03-05-11  . Esophagitis   . Depression   . GERD (gastroesophageal reflux disease)   . Fibromyalgia   . Cataracts, bilateral     more in left than right  . Osteoarthritis of left shoulder 12/07/2011  . Heart murmur   . Leaky heart valve   . Anginal pain [facility])     "comes and goes has occured since 2012"  . Bronchitis Jan 2013-March 2013    severe  . Shortness of breath     attributed to aneurysm  . Vertigo     hx of  . Urinary leakage     when coughing  . Sliding hiatal hernia   . Fatty liver   . History of echocardiogram     Echo 11/16: Mild LVH, EF 60-65%, normal wall motion, grade 1 diastolic dysfunction, bioprosthetic AVR with mean  gradient 21 mmHg and no regurgitation, proximal aortic arch aneurysm 49 mm, moderate TR, PASP 31 mmHg    SURGICAL HISTORY: Past Surgical History  Procedure Laterality Date  . Abdominal hysterectomy    . Total knee arthroplasty      bilateral  . Cholecystectomy  2003  . Tonsillectomy  1941  . Wrist surgery      bilateral  . Bilateral oophorectomy    . Cosmetic surgery      Tummy tuck  . Total shoulder arthroplasty   12/07/2011    Procedure: TOTAL SHOULDER ARTHROPLASTY;  Surgeon: Johnny Bridge, MD;  Location: [place];  Service: Orthopedics;  Laterality: Left;  left total shoulder artthroplasty  . Joint replacement Bilateral   . Cardiac catheterization  09/15/12    no PCI  . Colonoscopy    . Breast biopsy Left   . Skin cancer excision Right     cheek  . Cataract extraction Bilateral   . [place] procedure N/A 09/29/2012    Procedure: BENTALL PROCEDURE;  Surgeon: Gaye Pollack, MD;  Location: [place];  Service: Open Heart Surgery;  Laterality: N/A;  WITH CIRC ARREST  . Intraoperative transesophageal echocardiogram N/A 09/29/2012    Procedure: INTRAOPERATIVE TRANSESOPHAGEAL ECHOCARDIOGRAM;  Surgeon: Gaye Pollack, MD;  Location: [facility] OR;  Service: Open Heart Surgery;  Laterality: N/A;  . Aorta surgery  12/2012    Resection of aneurysm and valve replacement  . Appendectomy  1956    SOCIAL HISTORY: Social History   Social History  . Marital Status: Divorced    Spouse Name: N/A  . Number of Children: N/A  . Years of Education: N/A   Occupational History  . retired    Social History Main Topics  . Smoking status: Never Smoker   . Smokeless tobacco: Never Used     Comment: She describes COPD related to secondhand exposure from her family and husband  . Alcohol Use: Yes     Comment: occassional  . Drug Use: No  . Sexual Activity: Not on file   Other Topics Concern  . Not on file   Social History Narrative    FAMILY HISTORY: Family History  Problem Relation Age of Onset  . Ovarian cancer Mother   . Heart disease Maternal Grandmother   . Clotting disorder      Father, brother, sister, and daughter all had deep venous thrombosis  . Breast cancer    . Diabetes Mother   . Pancreatic cancer Paternal Uncle   . COPD Father   . Colon cancer Neg Hx     ALLERGIES:  is allergic to codeine; pramipexole dihydrochloride; rofecoxib; acetaminophen; arava; clonazepam; diclofenac sodium; venlafaxine;  alprazolam; darvocet; duloxetine; gabapentin; latex; morphine and related; nortriptyline hcl; penicillins; and prednisone.  MEDICATIONS:  Current Outpatient Prescriptions  Medication Sig Dispense Refill  . aspirin EC 81 MG tablet Take 81 mg by mouth daily.    . Estradiol (VAGIFEM) 10 MCG TABS vaginal tablet INSERT ONE TABLET VAGINALLY EVERY 2 (TWO) DAYS 30 tablet 5  . oxyCODONE (OXYCONTIN) 10 mg 12 hr tablet Take 1 tablet (10 mg total) by mouth 3 (three) times daily. Please fill on or after 09/28/15 90 tablet 0  . rOPINIRole (REQUIP) 2 MG tablet Take 1 tablet (2 mg total) by mouth 3 (three) times daily. 90 tablet 5  . vitamin B-12 (CYANOCOBALAMIN) 500 MCG tablet Take 500 mcg by mouth daily.     No current facility-administered medications for this visit.    REVIEW  OF SYSTEMS:    10 Point review of Systems was done is negative except as noted above.  PHYSICAL EXAMINATION: ECOG PERFORMANCE STATUS: 2 - Symptomatic, <50% confined to bed  . Filed Vitals:   11/11/15 1359  BP: 133/55  Pulse: 79  Temp: 97.8 F (36.6 C)  Resp: 17   Filed Weights   11/11/15 1359  Weight: 150 lb 1.6 oz (68.085 kg)   .Body mass index is 29.31 kg/(m^2).  GENERAL:alert, in no acute distress and comfortable SKIN: skin color, texture, turgor are normal, no rashes or significant lesions EYES: normal, conjunctiva are pink and non-injected, sclera clear OROPHARYNX:no exudate, no erythema and lips, buccal mucosa, and tongue normal  NECK: supple, no JVD, thyroid normal size, non-tender, without nodularity LYMPH:  no palpable lymphadenopathy in the cervical, axillary or inguinal LUNGS: clear to auscultation with normal respiratory effort HEART: regular rate & rhythm,  no murmurs and no lower extremity edema ABDOMEN: abdomen soft, non-tender, normoactive bowel sounds , no palpable hepatosplenomegaly Musculoskeletal: no cyanosis of digits and no clubbing  PSYCH: alert & oriented x 3 with fluent speech NEURO: no  focal motor/sensory deficits  LABORATORY DATA:  I have reviewed the data as listed  . CBC Latest Ref Rng 08/18/2015 08/11/2015 08/06/2015  WBC 4.0 - 10.5 K/uL 4.1 4.0 4.8  Hemoglobin 12.0 - 15.0 g/dL 11.0(L) 11.5(L) 11.6(L)  Hematocrit 36.0 - 46.0 % 32.5(L) 35.6(L) 34.9(L)  Platelets 150 - 400 K/uL 114(L) 152 248    . CMP Latest Ref Rng 08/18/2015 08/11/2015 08/09/2015  Glucose 65 - 99 mg/dL - - -  BUN 6 - 20 mg/dL 24(H) 20 17  Creatinine 0.44 - 1.00 mg/dL 1.12(H) 1.10(H) 0.86  Sodium 135 - 145 mmol/L - - -  Potassium 3.5 - 5.1 mmol/L - - -  Chloride 101 - 111 mmol/L - - -  CO2 22 - 32 mmol/L - - -  Calcium 8.9 - 10.3 mg/dL - - -   Component     Latest Ref Rng & Units 11/11/2015 11/11/2015         3:46 PM  3:46 PM  WBC     3.9 - 10.3 10e3/uL 3.9   NEUT#     1.5 - 6.5 10e3/uL 1.8   Hemoglobin     11.6 - 15.9 g/dL 10.6 (L)   HCT     34.8 - 46.6 % 32.1 (L) 31.4 (L)  Platelets     145 - 400 10e3/uL 134 (L)   MCV     79.5 - 101.0 fL 96.1   MCH     25.1 - 34.0 pg 31.7   MCHC     31.5 - 36.0 g/dL 33.0   RBC     3.70 - 5.45 10e6/uL 3.34 (L)   RDW     11.2 - 14.5 % 13.6   lymph#     0.9 - 3.3 10e3/uL 1.3   MONO#     0.1 - 0.9 10e3/uL 0.3   Eosinophils Absolute     0.0 - 0.5 10e3/uL 0.4   Basophils Absolute     0.0 - 0.1 10e3/uL 0.0   NEUT%     38.4 - 76.8 % 45.9   LYMPH%     14.0 - 49.7 % 34.4   MONO%     0.0 - 14.0 % 8.7   EOS%     0.0 - 7.0 % 10.0 (H)   BASO%     0.0 - 2.0 % 1.0   nRBC  0 - 0 % 0   Retic %     0.70 - 2.10 % 1.15   Retic Ct Abs     33.70 - 90.70 10e3/uL 38.41   Immature Retic Fract     1.60 - 10.00 % 6.60   Sodium     136 - 145 mEq/L 144   Potassium     3.5 - 5.1 mEq/L 5.0   Chloride     98 - 109 mEq/L 108   CO2     22 - 29 mEq/L 28   Glucose     70 - 140 mg/dl 92   BUN     7.0 - 26.0 mg/dL 28.7 (H)   Creatinine     0.6 - 1.1 mg/dL 1.3 (H)   Total Bilirubin     0.20 - 1.20 mg/dL 0.38   Alkaline Phosphatase     40 - 150 U/L 80    AST     5 - 34 U/L 18   ALT     0 - 55 U/L <9   Total Protein     6.4 - 8.3 g/dL 7.5   Albumin     3.5 - 5.0 g/dL 3.8   Calcium     8.4 - 10.4 mg/dL 9.7   Anion gap     3 - 11 mEq/L 9   EGFR     >90 ml/min/1.73 m2 38 (L)   Folate, Hemolysate     Not Estab. ng/mL  463.1  Folate, RBC     >498 ng/mL  1,475  Sed Rate     0 - 40 mm/hr 10   Vitamin B12     211 - 946 pg/mL 560     RADIOGRAPHIC STUDIES: I have personally reviewed the radiological images as listed and agreed with the findings in the report. No results found.  ASSESSMENT & PLAN:   81 yo with multiple medical comorbidities with  #1 leukopenia likely related to the use of IV antibiotics vancomycin and cefepime as well as due to infection in her spine with acute discitis/osteomyelitis with possible abscess formation. Patient's leukopenia has resolved. She is not neutropenic.  #2 mild thrombocytopenia platelet count at the [facility] were 84k. They have now improved to 134k This also appears to be likely related to her IV antibiotics and infection and not improving as her antibiotics have been completed.  #3 Normocytic/somewhat microcytic anemia. History of B12 deficiency though Z60 and folic acid levels are within normal limits. Part of the anemia seems to be anemia of chronic inflammation related to her discitis/epidural/psoas abscess. Sedimentation rate is down to 10 which is good. She also has a history of rheumatoid arthritis which can cause anemia of chronic disease.  Plan -I discussed on the findings with the patient. -We discussed that her labs and her peripheral blood smear are not suggestive of a primary bone marrow or underlying primary hematologic disorder. -Leukopenia and thrombocytopenia which are resolving appeared to be related to her significant lumbar spinal estimated to/discitis with abscess formation and the use of IV antibiotics. -She is to continue following up with her primary care physician and  infectious disease doctor to monitor counts on antibiotics. -Optimize treatment of her infection and source control. -Continue follow-up with dermatology to optimize treatment of her rheumatoid arthritis.  We shall follow her up on an as-needed basis if any new questions or concerns arise.  All of the patients questions were answered with apparent satisfaction. The patient knows to  call the clinic with any problems, questions or concerns.  I spent 45 minutes counseling the patient face to face. The total time spent in the appointment was 60 minutes and more than 50% was on counseling and direct patient cares.    Sullivan Lone MD [place] [employer] [facility] Hematology/Oncology Physician [facility]  (Office):       [phone] (Work cell):  [phone] (Fax):           [phone]  11/11/2015 2:41 PM

## 2015-11-12 LAB — VITAMIN B12: VITAMIN B 12: 560 pg/mL (ref 211–946)

## 2015-11-12 LAB — FOLATE RBC
FOLATE, HEMOLYSATE: 463.1 ng/mL
Folate, RBC: 1475 ng/mL (ref >498)
HEMATOCRIT: 31.4 % — AB (ref 34.0–46.6)

## 2015-11-12 LAB — SEDIMENTATION RATE: SED RATE: 10 mm/h (ref 0–40)

## 2015-11-28 ENCOUNTER — Ambulatory Visit: Payer: Medicare Other | Admitting: Internal Medicine

## 2015-11-28 DIAGNOSIS — M797 Fibromyalgia: Secondary | ICD-10-CM | POA: Diagnosis not present

## 2015-11-28 DIAGNOSIS — M5137 Other intervertebral disc degeneration, lumbosacral region: Secondary | ICD-10-CM | POA: Diagnosis not present

## 2015-11-28 DIAGNOSIS — M19241 Secondary osteoarthritis, right hand: Secondary | ICD-10-CM | POA: Diagnosis not present

## 2015-11-28 DIAGNOSIS — M0579 Rheumatoid arthritis with rheumatoid factor of multiple sites without organ or systems involvement: Secondary | ICD-10-CM | POA: Diagnosis not present

## 2015-12-02 ENCOUNTER — Ambulatory Visit (INDEPENDENT_AMBULATORY_CARE_PROVIDER_SITE_OTHER): Payer: Medicare Other | Admitting: Internal Medicine

## 2015-12-02 ENCOUNTER — Encounter: Payer: Self-pay | Admitting: Internal Medicine

## 2015-12-02 DIAGNOSIS — L84 Corns and callosities: Secondary | ICD-10-CM | POA: Diagnosis not present

## 2015-12-02 MED ORDER — OXYCODONE HCL ER 10 MG PO T12A: 10.0000 mg | EXTENDED_RELEASE_TABLET | Freq: Two times a day (BID) | ORAL | 0 refills | Status: DC

## 2015-12-02 NOTE — Assessment & Plan Note (Signed)
See procedure 

## 2015-12-02 NOTE — Progress Notes (Signed)
Pre visit review using our clinic review tool, if applicable. No additional management support is needed unless otherwise documented below in the visit note. 

## 2015-12-02 NOTE — Progress Notes (Signed)
Subjective:  Patient ID: Diane Abbott, female    DOB: [birth year]  Age: 81 y.o. MRN: [id number]  CC: No chief complaint on file.   HPI Diane Abbott presents for LBP, aneurism, depression f/u. C/o bruising C/o R foot callus  Outpatient Medications Prior to Visit  Medication Sig Dispense Refill  . aspirin EC 81 MG tablet Take 81 mg by mouth daily.    . Estradiol (VAGIFEM) 10 MCG TABS vaginal tablet INSERT ONE TABLET VAGINALLY EVERY 2 (TWO) DAYS 30 tablet 5  . oxyCODONE (OXYCONTIN) 10 mg 12 hr tablet Take 1 tablet (10 mg total) by mouth 3 (three) times daily. Please fill on or after 09/28/15 (Patient taking differently: Take 10 mg by mouth 2 (two) times daily. Please fill on or after 09/28/15) 90 tablet 0  . rOPINIRole (REQUIP) 2 MG tablet Take 1 tablet (2 mg total) by mouth 3 (three) times daily. 90 tablet 5   No facility-administered medications prior to visit.     ROS Review of Systems  Constitutional: Positive for fatigue. Negative for activity change, appetite change, chills and unexpected weight change.  HENT: Negative for congestion, mouth sores and sinus pressure.   Eyes: Negative for visual disturbance.  Respiratory: Negative for cough and chest tightness.   Gastrointestinal: Negative for abdominal pain and nausea.  Genitourinary: Negative for difficulty urinating, frequency and vaginal pain.  Musculoskeletal: Positive for arthralgias and back pain. Negative for gait problem.  Skin: Negative for pallor and rash.  Neurological: Negative for dizziness, tremors, weakness, numbness and headaches.  Hematological: Bruises/bleeds easily.  Psychiatric/Behavioral: Negative for confusion and sleep disturbance. The patient is nervous/anxious.     Objective:  BP 140/84   Pulse 73   Wt 150 lb (68 kg)   SpO2 98%   BMI 29.29 kg/m   BP Readings from Last 3 Encounters:  12/02/15 140/84  11/11/15 (!) 133/55  10/09/15 (!) 150/100    Wt Readings from Last 3 Encounters:    12/02/15 150 lb (68 kg)  11/11/15 150 lb 1.6 oz (68.1 kg)  10/09/15 144 lb (65.3 kg)    Physical Exam  Constitutional: She appears well-developed. No distress.  HENT:  Head: Normocephalic.  Right Ear: External ear normal.  Left Ear: External ear normal.  Nose: Nose normal.  Mouth/Throat: Oropharynx is clear and moist.  Eyes: Conjunctivae are normal. Pupils are equal, round, and reactive to light. Right eye exhibits no discharge. Left eye exhibits no discharge.  Neck: Normal range of motion. Neck supple. No JVD present. No tracheal deviation present. No thyromegaly present.  Cardiovascular: Normal rate, regular rhythm and normal heart sounds.   Pulmonary/Chest: No stridor. No respiratory distress. She has no wheezes.  Abdominal: Soft. Bowel sounds are normal. She exhibits no distension and no mass. There is no tenderness. There is no rebound and no guarding.  Musculoskeletal: She exhibits tenderness. She exhibits no edema.  Lymphadenopathy:    She has no cervical adenopathy.  Neurological: She displays normal reflexes. No cranial nerve deficit. She exhibits normal muscle tone. Coordination normal.  Skin: No rash noted. No erythema.  Psychiatric: She has a normal mood and affect. Her behavior is normal. Judgment and thought content normal.  R foot callus R abd 3 mm papule  Procedure: R  foot callus paring/cutting  Indication: R  foot callus, painful  Consent: verbal  Risks and benefits were explained to the patient. Skin was cleaned with alcohol. I removed a large callus carefully with a round blade. Skin  remained intact. Pain is better.  Tolerated well. Complications: none.    Lab Results  Component Value Date   WBC 3.9 11/11/2015   HGB 10.6 (L) 11/11/2015   HCT 32.1 (L) 11/11/2015   HCT 31.4 (L) 11/11/2015   PLT 134 (L) 11/11/2015   GLUCOSE 92 11/11/2015   ALT <9 11/11/2015   AST 18 11/11/2015   NA 144 11/11/2015   K 5.0 11/11/2015   CL 102 08/08/2015   CREATININE  1.3 (H) 11/11/2015   BUN 28.7 (H) 11/11/2015   CO2 28 11/11/2015   TSH 2.88 01/09/2011   INR 1.20 07/25/2015   HGBA1C 6.2 (H) 08/08/2015    Ct Angio Chest Aorta W/cm &/or Wo/cm  Result Date: 10/07/2015 CLINICAL DATA:  Follow-up thoracic abdominal aortic aneurysm. Prior valve replacement. EXAM: CT ANGIOGRAPHY CHEST, ABDOMEN AND PELVIS TECHNIQUE: Multidetector CT imaging through the chest, abdomen and pelvis was performed using the standard protocol during bolus administration of intravenous contrast. Multiplanar reconstructed images and MIPs were obtained and reviewed to evaluate the vascular anatomy. CONTRAST:  50 cc Isovue 370 IV. COMPARISON:  Chest CT 03/25/2015.  CT abdomen and pelvis 08/24/2012. FINDINGS: CTA CHEST FINDINGS Mediastinum/Nodes: Changes of prior aortic valve replacement and tube graft repair of the ascending thoracic aorta. Continued aneurysmal dilatation of the distal ascending aorta, measuring 5.2 cm maximally, stable. Proximal descending thoracic aorta measures 3.8 cm compared to 4.0 cm previously. There is tortuosity of the descending thoracic aorta which is ectatic. Heart is normal size. No dissection. No adenopathy. Lungs/Pleura: Lungs are clear. No focal airspace opacities or suspicious nodules. No effusions. Musculoskeletal: Chest wall soft tissues are unremarkable. No acute bony abnormality or focal bone lesion. Degenerative changes throughout the thoracic spine. Review of the MIP images confirms the above findings. CTA ABDOMEN AND PELVIS FINDINGS Hepatobiliary: No focal hepatic abnormality.  Prior cholecystectomy. Pancreas: No focal abnormality or ductal dilatation. Spleen: Normal size. Scattered small hypodensities throughout the liver, likely small cysts. Adrenals/Urinary Tract: New small enhancing lesion off the lower pole of the right kidney measures 9 mm concerning for a small solid renal lesion. No hydronephrosis. Adrenal glands and urinary bladder grossly unremarkable.  Stomach/Bowel: Stomach, large and small bowel grossly unremarkable. Vascular/Lymphatic: No evidence of abdominal aortic aneurysm. Maximum aortic diameter 2.7 cm proximally. Mesenteric vessels and single renal arteries are widely patent. No adenopathy. Reproductive: Prior hysterectomy.  No adnexal masses. Other: No free fluid or free air. Musculoskeletal: Severe compression fracture of the L1 vertebral body, progressed since prior study. Review of the MIP images confirms the above findings. IMPRESSION: Prior tube graft repair of the ascending thoracic aorta with stable aneurysmal dilatation of the distal ascending aorta, measuring 5.2 cm. Stable mild aneurysmal dilatation of the proximal descending thoracic aorta, and ectasia and tortuosity of the descending thoracic aorta. No evidence of abdominal aortic aneurysm. Tiny exophytic lesion off the lower pole of the right kidney which appears to be enhancing and solid, 9 mm. This could be further evaluated with MRI or renal protocol CT. Electronically Signed   By: Rolm Baptise M.D.   On: 10/07/2015 13:19   Ct Angio Abd/pel W/ And/or W/o  Result Date: 10/07/2015 CLINICAL DATA:  Follow-up thoracic abdominal aortic aneurysm. Prior valve replacement. EXAM: CT ANGIOGRAPHY CHEST, ABDOMEN AND PELVIS TECHNIQUE: Multidetector CT imaging through the chest, abdomen and pelvis was performed using the standard protocol during bolus administration of intravenous contrast. Multiplanar reconstructed images and MIPs were obtained and reviewed to evaluate the vascular anatomy. CONTRAST:  50  cc Isovue 370 IV. COMPARISON:  Chest CT 03/25/2015.  CT abdomen and pelvis 08/24/2012. FINDINGS: CTA CHEST FINDINGS Mediastinum/Nodes: Changes of prior aortic valve replacement and tube graft repair of the ascending thoracic aorta. Continued aneurysmal dilatation of the distal ascending aorta, measuring 5.2 cm maximally, stable. Proximal descending thoracic aorta measures 3.8 cm compared to 4.0 cm  previously. There is tortuosity of the descending thoracic aorta which is ectatic. Heart is normal size. No dissection. No adenopathy. Lungs/Pleura: Lungs are clear. No focal airspace opacities or suspicious nodules. No effusions. Musculoskeletal: Chest wall soft tissues are unremarkable. No acute bony abnormality or focal bone lesion. Degenerative changes throughout the thoracic spine. Review of the MIP images confirms the above findings. CTA ABDOMEN AND PELVIS FINDINGS Hepatobiliary: No focal hepatic abnormality.  Prior cholecystectomy. Pancreas: No focal abnormality or ductal dilatation. Spleen: Normal size. Scattered small hypodensities throughout the liver, likely small cysts. Adrenals/Urinary Tract: New small enhancing lesion off the lower pole of the right kidney measures 9 mm concerning for a small solid renal lesion. No hydronephrosis. Adrenal glands and urinary bladder grossly unremarkable. Stomach/Bowel: Stomach, large and small bowel grossly unremarkable. Vascular/Lymphatic: No evidence of abdominal aortic aneurysm. Maximum aortic diameter 2.7 cm proximally. Mesenteric vessels and single renal arteries are widely patent. No adenopathy. Reproductive: Prior hysterectomy.  No adnexal masses. Other: No free fluid or free air. Musculoskeletal: Severe compression fracture of the L1 vertebral body, progressed since prior study. Review of the MIP images confirms the above findings. IMPRESSION: Prior tube graft repair of the ascending thoracic aorta with stable aneurysmal dilatation of the distal ascending aorta, measuring 5.2 cm. Stable mild aneurysmal dilatation of the proximal descending thoracic aorta, and ectasia and tortuosity of the descending thoracic aorta. No evidence of abdominal aortic aneurysm. Tiny exophytic lesion off the lower pole of the right kidney which appears to be enhancing and solid, 9 mm. This could be further evaluated with MRI or renal protocol CT. Electronically Signed   By: Rolm Baptise M.D.   On: 10/07/2015 13:19    Assessment & Plan:   There are no diagnoses linked to this encounter. I am having Ms. Begue maintain her Estradiol, aspirin EC, oxyCODONE, and rOPINIRole.  No orders of the defined types were placed in this encounter.    Follow-up: No Follow-up on file.  Walker Kehr, MD

## 2015-12-20 ENCOUNTER — Other Ambulatory Visit: Payer: Self-pay | Admitting: Internal Medicine

## 2015-12-24 ENCOUNTER — Telehealth: Payer: Self-pay | Admitting: *Deleted

## 2015-12-24 NOTE — Telephone Encounter (Signed)
Wondering about Neurosugery appointment - f/u spinal fracture.  Patient was not contacted by Dr. Cleotilde Neer office to set up a hospital follow-up appointment. Left message for Dr. Cleotilde Neer office to call the patient to schedule this follow-up appointment.  Office given patient's name, date of birth and telephone number to arrange this appointment. Left message for the patient that Dr. Cleotilde Neer office will call with appointment.  Dr Cleotilde Neer phone number was left for the patient to call that office if she does not hear within 7-10 business days.

## 2015-12-24 NOTE — Telephone Encounter (Signed)
Dr Alinda Sierras can see the patient next week.  They left her a message to call to make appointment.

## 2015-12-27 ENCOUNTER — Encounter: Payer: Self-pay | Admitting: Internal Medicine

## 2015-12-27 DIAGNOSIS — M810 Age-related osteoporosis without current pathological fracture: Secondary | ICD-10-CM | POA: Diagnosis not present

## 2015-12-27 DIAGNOSIS — Z1231 Encounter for screening mammogram for malignant neoplasm of breast: Secondary | ICD-10-CM | POA: Diagnosis not present

## 2015-12-27 LAB — HM DEXA SCAN

## 2016-01-01 DIAGNOSIS — M8660 Other chronic osteomyelitis, unspecified site: Secondary | ICD-10-CM | POA: Diagnosis not present

## 2016-01-01 DIAGNOSIS — Z6828 Body mass index (BMI) 28.0-28.9, adult: Secondary | ICD-10-CM | POA: Diagnosis not present

## 2016-01-01 DIAGNOSIS — M4015 Other secondary kyphosis, thoracolumbar region: Secondary | ICD-10-CM | POA: Diagnosis not present

## 2016-01-01 DIAGNOSIS — M549 Dorsalgia, unspecified: Secondary | ICD-10-CM | POA: Diagnosis not present

## 2016-01-02 ENCOUNTER — Encounter: Payer: Self-pay | Admitting: Internal Medicine

## 2016-01-02 ENCOUNTER — Ambulatory Visit (INDEPENDENT_AMBULATORY_CARE_PROVIDER_SITE_OTHER): Payer: Medicare Other | Admitting: Internal Medicine

## 2016-01-02 VITALS — BP 140/82 | HR 84 | Resp 20 | Wt 152.0 lb

## 2016-01-02 DIAGNOSIS — F411 Generalized anxiety disorder: Secondary | ICD-10-CM | POA: Diagnosis not present

## 2016-01-02 DIAGNOSIS — R079 Chest pain, unspecified: Secondary | ICD-10-CM | POA: Diagnosis not present

## 2016-01-02 DIAGNOSIS — J439 Emphysema, unspecified: Secondary | ICD-10-CM

## 2016-01-02 NOTE — Assessment & Plan Note (Signed)
stable overall by history and exam, recent data reviewed with pt, and pt to continue medical treatment as before,  to f/u any worsening symptoms or concerns @LASTSAO2(3)@  

## 2016-01-02 NOTE — Patient Instructions (Signed)
Please continue all other medications as before, and refills have been done if requested.  Please have the pharmacy call with any other refills you may need.  Please continue your efforts at being more active, low cholesterol diet, and weight control.  Please keep your appointments with your specialists as you may have planned     

## 2016-01-02 NOTE — Progress Notes (Signed)
Pre visit review using our clinic review tool, if applicable. No additional management support is needed unless otherwise documented below in the visit note. 

## 2016-01-02 NOTE — Assessment & Plan Note (Signed)
D/w pt, reassured, declines need for counseling or other tx

## 2016-01-02 NOTE — Progress Notes (Signed)
Subjective:    Patient ID: [name], female    DOB: [birth year], 81 y.o.   MRN: [id number]  HPI  81yo F with hx of current aortic arch enlargement s/p AVR and prox aortic arch replacement, chronic chest pain, discitis requiring antibiotics, and anxiety who presents today very concerned about noticing for the first time a hard place to lowest sternal edge, nontender but concerned this is the aneurysm getting worse. Pt denies increased sob or doe, wheezing, orthopnea, PND, increased LE swelling, palpitations, dizziness or syncope.  Pt denies fever. Past Medical History:  Diagnosis Date  . Adrenal insufficiency ([place])   . Anginal pain [facility])    "comes and goes has occured since 2012"  . Bronchitis Jan 2013-March 2013   severe  . Cataracts, bilateral    more in left than right  . Depression   . Diabetes mellitus type II    pt denies this 03-05-11  . Esophagitis   . Fatty liver   . Fibromyalgia   . Fibromyalgia   . GERD (gastroesophageal reflux disease)   . Heart murmur   . History of blood clots 2008   below knee post trauma. Dilated [facility] no clotting disorder felt to be present.  . History of echocardiogram    Echo 11/16: Mild LVH, EF 60-65%, normal wall motion, grade 1 diastolic dysfunction, bioprosthetic AVR with mean gradient 21 mmHg and no regurgitation, proximal aortic arch aneurysm 49 mm, moderate TR, PASP 31 mmHg  . IBS (irritable bowel syndrome)   . Leaky heart valve   . Osteoarthritis   . Osteoarthritis of left shoulder 12/07/2011  . Osteopenia   . Pulmonary embolism ([place])   . RA (rheumatoid arthritis) ([place])   . Shortness of breath    attributed to aneurysm  . Sliding hiatal hernia   . Urinary leakage    when coughing  . Vertigo    hx of  . Vitamin B 12 deficiency   . Vitamin D deficiency    Past Surgical History:  Procedure Laterality Date  . ABDOMINAL HYSTERECTOMY    . AORTA SURGERY  12/2012   Resection of aneurysm and valve replacement  .  APPENDECTOMY  1956  . BENTALL PROCEDURE N/A 09/29/2012   Procedure: BENTALL PROCEDURE;  Surgeon: Gaye Pollack, MD;  Location: [place];  Service: Open Heart Surgery;  Laterality: N/A;  WITH CIRC ARREST  . BILATERAL OOPHORECTOMY    . BREAST BIOPSY Left   . CARDIAC CATHETERIZATION  09/15/12   no PCI  . CATARACT EXTRACTION Bilateral   . CHOLECYSTECTOMY  2003  . COLONOSCOPY    . COSMETIC SURGERY     Tummy tuck  . INTRAOPERATIVE TRANSESOPHAGEAL ECHOCARDIOGRAM N/A 09/29/2012   Procedure: INTRAOPERATIVE TRANSESOPHAGEAL ECHOCARDIOGRAM;  Surgeon: Gaye Pollack, MD;  Location: [facility] OR;  Service: Open Heart Surgery;  Laterality: N/A;  . JOINT REPLACEMENT Bilateral   . SKIN CANCER EXCISION Right    cheek  . TONSILLECTOMY  1941  . TOTAL KNEE ARTHROPLASTY     bilateral  . TOTAL SHOULDER ARTHROPLASTY  12/07/2011   Procedure: TOTAL SHOULDER ARTHROPLASTY;  Surgeon: Johnny Bridge, MD;  Location: [place];  Service: Orthopedics;  Laterality: Left;  left total shoulder artthroplasty  . WRIST SURGERY     bilateral    reports that she has never smoked. She has never used smokeless tobacco. She reports that she drinks alcohol. She reports that she does not use drugs. family history includes COPD in her father;  Diabetes in her mother; Heart disease in her maternal grandmother; Ovarian cancer in her mother; Pancreatic cancer in her paternal uncle. Allergies  Allergen Reactions  . Codeine Other (See Comments)    Blood pressure drops; "I pass out."  . Pramipexole Dihydrochloride Nausea Only    sick, falling  . Rofecoxib Other (See Comments)    "sleep walks"  . Acetaminophen   . Arava [Leflunomide] Swelling  . Clonazepam   . Diclofenac Sodium   . Venlafaxine   . Alprazolam Other (See Comments)    "Makes me mean"  . Darvocet [Propoxyphene N-Acetaminophen] Other (See Comments)    "makes my eyes dilate. Pt stated I can't see"  . Duloxetine Other (See Comments)    achy legs  . Gabapentin Other (See Comments)      headache  . Latex Itching and Dermatitis    When examined with latex gloves, burns and itches in contact areas per pt.  . Morphine And Related Other (See Comments)    Hallucinations   . Nortriptyline Hcl Other (See Comments)    Migraines   . Penicillins Rash    Has patient had a PCN reaction causing immediate rash, facial/tongue/throat swelling, SOB or lightheadedness with hypotension: Yes Has patient had a PCN reaction causing severe rash involving mucus membranes or skin necrosis: Yes Has patient had a PCN reaction that required hospitalization No Has patient had a PCN reaction occurring within the last 10 years: Yes If all of the above answers are "NO", then may proceed with Cephalosporin use.   Can take Cephalosporins  . Prednisone Swelling    Just doesn't want to take it.   Current Outpatient Prescriptions on File Prior to Visit  Medication Sig Dispense Refill  . aspirin EC 81 MG tablet Take 81 mg by mouth daily.    Marland Kitchen oxyCODONE (OXYCONTIN) 10 mg 12 hr tablet Take 1 tablet (10 mg total) by mouth 2 (two) times daily. Please fill on 02/01/16 60 tablet 0  . rOPINIRole (REQUIP) 2 MG tablet Take 1 tablet (2 mg total) by mouth 3 (three) times daily. 90 tablet 5  . VAGIFEM 10 MCG TABS vaginal tablet INSERT 1 TABLET VAGINALLY EVERY TWO DAYS 30 tablet 5   No current facility-administered medications on file prior to visit.    Review of Systems  Constitutional: Negative for unusual diaphoresis or night sweats HENT: Negative for ear swelling or discharge Eyes: Negative for worsening visual haziness  Respiratory: Negative for choking and stridor.   Gastrointestinal: Negative for distension or worsening eructation Genitourinary: Negative for retention or change in urine volume.  Musculoskeletal: Negative for other MSK pain or swelling Skin: Negative for color change and worsening wound Neurological: Negative for tremors and numbness other than noted  Psychiatric/Behavioral: Negative  for decreased concentration or agitation other than above       Objective:   Physical Exam BP 140/82   Pulse 84   Resp 20   Wt 152 lb (68.9 kg)   SpO2 96%   BMI 29.69 kg/m  VS noted,  Constitutional: Pt appears in no apparent distress HENT: Head: NCAT.  Right Ear: External ear normal.  Left Ear: External ear normal.  Eyes: . Pupils are equal, round, and reactive to light. Conjunctivae and EOM are normal Neck: Normal range of motion. Neck supple.  Cardiovascular: Normal rate and regular rhythm.   Pulmonary/Chest: Effort normal and breath sounds without rales or wheezing.  Abd:  Soft, NT, ND, + BS; has normal sized nontender nonmobile xyphoid  process at the exact location of which she is concerned Neurological: Pt is alert. Not confused , motor grossly intact Skin: Skin is warm. No rash, no LE edema Psychiatric: Pt behavior is normal. No agitation. but 2+ nervous     Assessment & Plan:

## 2016-01-02 NOTE — Assessment & Plan Note (Signed)
Chronic, dw pt benign nature of non inflamed xyphoid process, and no further eval or tx needed, ok to continue to monitor

## 2016-01-09 ENCOUNTER — Encounter: Payer: Self-pay | Admitting: Internal Medicine

## 2016-02-17 DIAGNOSIS — H6121 Impacted cerumen, right ear: Secondary | ICD-10-CM | POA: Diagnosis not present

## 2016-02-17 DIAGNOSIS — H722X1 Other marginal perforations of tympanic membrane, right ear: Secondary | ICD-10-CM | POA: Insufficient documentation

## 2016-02-17 DIAGNOSIS — H6981 Other specified disorders of Eustachian tube, right ear: Secondary | ICD-10-CM | POA: Insufficient documentation

## 2016-02-25 ENCOUNTER — Encounter: Payer: Self-pay | Admitting: Internal Medicine

## 2016-02-25 ENCOUNTER — Other Ambulatory Visit (INDEPENDENT_AMBULATORY_CARE_PROVIDER_SITE_OTHER): Payer: Medicare Other

## 2016-02-25 ENCOUNTER — Ambulatory Visit (INDEPENDENT_AMBULATORY_CARE_PROVIDER_SITE_OTHER): Payer: Medicare Other | Admitting: Internal Medicine

## 2016-02-25 DIAGNOSIS — E559 Vitamin D deficiency, unspecified: Secondary | ICD-10-CM | POA: Diagnosis not present

## 2016-02-25 DIAGNOSIS — N183 Chronic kidney disease, stage 3 unspecified: Secondary | ICD-10-CM

## 2016-02-25 DIAGNOSIS — N2889 Other specified disorders of kidney and ureter: Secondary | ICD-10-CM

## 2016-02-25 DIAGNOSIS — F331 Major depressive disorder, recurrent, moderate: Secondary | ICD-10-CM

## 2016-02-25 DIAGNOSIS — Z952 Presence of prosthetic heart valve: Secondary | ICD-10-CM | POA: Diagnosis not present

## 2016-02-25 DIAGNOSIS — E538 Deficiency of other specified B group vitamins: Secondary | ICD-10-CM | POA: Diagnosis not present

## 2016-02-25 LAB — COMPREHENSIVE METABOLIC PANEL
ALT: 8 U/L (ref 0–35)
AST: 15 U/L (ref 0–37)
Albumin: 4.2 g/dL (ref 3.5–5.2)
Alkaline Phosphatase: 76 U/L (ref 39–117)
BUN: 33 mg/dL — ABNORMAL HIGH (ref 6–23)
CHLORIDE: 106 meq/L (ref 96–112)
CO2: 28 meq/L (ref 19–32)
Calcium: 9.6 mg/dL (ref 8.4–10.5)
Creatinine, Ser: 1.63 mg/dL — ABNORMAL HIGH (ref 0.40–1.20)
GFR: 32.28 mL/min — AB (ref >60.00)
GLUCOSE: 93 mg/dL (ref 70–99)
POTASSIUM: 4.3 meq/L (ref 3.5–5.1)
Sodium: 141 mEq/L (ref 135–145)
Total Bilirubin: 0.5 mg/dL (ref 0.2–1.2)
Total Protein: 7.3 g/dL (ref 6.0–8.3)

## 2016-02-25 MED ORDER — CYANOCOBALAMIN 1000 MCG SL SUBL: 1.0000 | SUBLINGUAL_TABLET | Freq: Every day | SUBLINGUAL | 11 refills | Status: DC

## 2016-02-25 MED ORDER — OXYCODONE HCL ER 10 MG PO T12A: 10.0000 mg | EXTENDED_RELEASE_TABLET | Freq: Two times a day (BID) | ORAL | 0 refills | Status: DC

## 2016-02-25 MED ORDER — VITAMIN D3 50 MCG (2000 UT) PO CAPS: 2000.0000 [IU] | ORAL_CAPSULE | Freq: Every day | ORAL | 3 refills | Status: DC

## 2016-02-25 NOTE — Progress Notes (Addendum)
Subjective:  Patient ID: Diane Abbott, female    DOB: [birth year]  Age: 81 y.o. MRN: [id number]  CC: No chief complaint on file.   HPI Diane Abbott presents for chronic pain, LBP, aneurism repair f/u. C/o vertigo - worse. No recent falls. Not taking some of her meds - vitamins  Outpatient Medications Prior to Visit  Medication Sig Dispense Refill  . aspirin EC 81 MG tablet Take 81 mg by mouth daily.    Marland Kitchen oxyCODONE (OXYCONTIN) 10 mg 12 hr tablet Take 1 tablet (10 mg total) by mouth 2 (two) times daily. Please fill on 02/01/16 60 tablet 0  . rOPINIRole (REQUIP) 2 MG tablet Take 1 tablet (2 mg total) by mouth 3 (three) times daily. 90 tablet 5  . VAGIFEM 10 MCG TABS vaginal tablet INSERT 1 TABLET VAGINALLY EVERY TWO DAYS 30 tablet 5   No facility-administered medications prior to visit.     ROS Review of Systems  Constitutional: Positive for fatigue. Negative for activity change, appetite change, chills and unexpected weight change.  HENT: Negative for congestion, mouth sores and sinus pressure.   Eyes: Negative for visual disturbance.  Respiratory: Negative for cough and chest tightness.   Gastrointestinal: Negative for abdominal pain and nausea.  Genitourinary: Negative for difficulty urinating, frequency and vaginal pain.  Musculoskeletal: Positive for arthralgias and back pain. Negative for gait problem.  Skin: Negative for pallor and rash.  Neurological: Positive for dizziness, weakness and light-headedness. Negative for tremors, numbness and headaches.  Psychiatric/Behavioral: Positive for dysphoric mood. Negative for confusion, sleep disturbance and suicidal ideas. The patient is nervous/anxious.     Objective:  BP 130/80   Wt 152 lb (68.9 kg)   BMI 29.69 kg/m   BP Readings from Last 3 Encounters:  02/25/16 130/80  01/02/16 140/82  12/02/15 140/84    Wt Readings from Last 3 Encounters:  02/25/16 152 lb (68.9 kg)  01/02/16 152 lb (68.9 kg)  12/02/15 150 lb (68  kg)    Physical Exam  Constitutional: She appears well-developed. No distress.  HENT:  Head: Normocephalic.  Right Ear: External ear normal.  Left Ear: External ear normal.  Nose: Nose normal.  Mouth/Throat: Oropharynx is clear and moist.  Eyes: Conjunctivae are normal. Pupils are equal, round, and reactive to light. Right eye exhibits no discharge. Left eye exhibits no discharge.  Neck: Normal range of motion. Neck supple. No JVD present. No tracheal deviation present. No thyromegaly present.  Cardiovascular: Normal rate and regular rhythm.   Murmur heard. Pulmonary/Chest: No stridor. No respiratory distress. She has no wheezes.  Abdominal: Soft. Bowel sounds are normal. She exhibits no distension and no mass. There is no tenderness. There is no rebound and no guarding.  Musculoskeletal: She exhibits no edema or tenderness.  Lymphadenopathy:    She has no cervical adenopathy.  Neurological: She displays normal reflexes. No cranial nerve deficit. She exhibits normal muscle tone. Coordination abnormal.  Skin: No rash noted. No erythema.  Psychiatric: Her behavior is normal. Judgment and thought content normal.  2/6 murmur dysphoric  Lab Results  Component Value Date   WBC 3.9 11/11/2015   HGB 10.6 (L) 11/11/2015   HCT 32.1 (L) 11/11/2015   HCT 31.4 (L) 11/11/2015   PLT 134 (L) 11/11/2015   GLUCOSE 92 11/11/2015   ALT <9 11/11/2015   AST 18 11/11/2015   NA 144 11/11/2015   K 5.0 11/11/2015   CL 102 08/08/2015   CREATININE 1.3 (H) 11/11/2015  BUN 28.7 (H) 11/11/2015   CO2 28 11/11/2015   TSH 2.88 01/09/2011   INR 1.20 07/25/2015   HGBA1C 6.2 (H) 08/08/2015    Ct Angio Chest Aorta W/cm &/or Wo/cm  Result Date: 10/07/2015 CLINICAL DATA:  Follow-up thoracic abdominal aortic aneurysm. Prior valve replacement. EXAM: CT ANGIOGRAPHY CHEST, ABDOMEN AND PELVIS TECHNIQUE: Multidetector CT imaging through the chest, abdomen and pelvis was performed using the standard protocol  during bolus administration of intravenous contrast. Multiplanar reconstructed images and MIPs were obtained and reviewed to evaluate the vascular anatomy. CONTRAST:  50 cc Isovue 370 IV. COMPARISON:  Chest CT 03/25/2015.  CT abdomen and pelvis 08/24/2012. FINDINGS: CTA CHEST FINDINGS Mediastinum/Nodes: Changes of prior aortic valve replacement and tube graft repair of the ascending thoracic aorta. Continued aneurysmal dilatation of the distal ascending aorta, measuring 5.2 cm maximally, stable. Proximal descending thoracic aorta measures 3.8 cm compared to 4.0 cm previously. There is tortuosity of the descending thoracic aorta which is ectatic. Heart is normal size. No dissection. No adenopathy. Lungs/Pleura: Lungs are clear. No focal airspace opacities or suspicious nodules. No effusions. Musculoskeletal: Chest wall soft tissues are unremarkable. No acute bony abnormality or focal bone lesion. Degenerative changes throughout the thoracic spine. Review of the MIP images confirms the above findings. CTA ABDOMEN AND PELVIS FINDINGS Hepatobiliary: No focal hepatic abnormality.  Prior cholecystectomy. Pancreas: No focal abnormality or ductal dilatation. Spleen: Normal size. Scattered small hypodensities throughout the liver, likely small cysts. Adrenals/Urinary Tract: New small enhancing lesion off the lower pole of the right kidney measures 9 mm concerning for a small solid renal lesion. No hydronephrosis. Adrenal glands and urinary bladder grossly unremarkable. Stomach/Bowel: Stomach, large and small bowel grossly unremarkable. Vascular/Lymphatic: No evidence of abdominal aortic aneurysm. Maximum aortic diameter 2.7 cm proximally. Mesenteric vessels and single renal arteries are widely patent. No adenopathy. Reproductive: Prior hysterectomy.  No adnexal masses. Other: No free fluid or free air. Musculoskeletal: Severe compression fracture of the L1 vertebral body, progressed since prior study. Review of the MIP  images confirms the above findings. IMPRESSION: Prior tube graft repair of the ascending thoracic aorta with stable aneurysmal dilatation of the distal ascending aorta, measuring 5.2 cm. Stable mild aneurysmal dilatation of the proximal descending thoracic aorta, and ectasia and tortuosity of the descending thoracic aorta. No evidence of abdominal aortic aneurysm. Tiny exophytic lesion off the lower pole of the right kidney which appears to be enhancing and solid, 9 mm. This could be further evaluated with MRI or renal protocol CT. Electronically Signed   By: Rolm Baptise M.D.   On: 10/07/2015 13:19   Ct Angio Abd/pel W/ And/or W/o  Result Date: 10/07/2015 CLINICAL DATA:  Follow-up thoracic abdominal aortic aneurysm. Prior valve replacement. EXAM: CT ANGIOGRAPHY CHEST, ABDOMEN AND PELVIS TECHNIQUE: Multidetector CT imaging through the chest, abdomen and pelvis was performed using the standard protocol during bolus administration of intravenous contrast. Multiplanar reconstructed images and MIPs were obtained and reviewed to evaluate the vascular anatomy. CONTRAST:  50 cc Isovue 370 IV. COMPARISON:  Chest CT 03/25/2015.  CT abdomen and pelvis 08/24/2012. FINDINGS: CTA CHEST FINDINGS Mediastinum/Nodes: Changes of prior aortic valve replacement and tube graft repair of the ascending thoracic aorta. Continued aneurysmal dilatation of the distal ascending aorta, measuring 5.2 cm maximally, stable. Proximal descending thoracic aorta measures 3.8 cm compared to 4.0 cm previously. There is tortuosity of the descending thoracic aorta which is ectatic. Heart is normal size. No dissection. No adenopathy. Lungs/Pleura: Lungs are clear. No focal airspace  opacities or suspicious nodules. No effusions. Musculoskeletal: Chest wall soft tissues are unremarkable. No acute bony abnormality or focal bone lesion. Degenerative changes throughout the thoracic spine. Review of the MIP images confirms the above findings. CTA ABDOMEN AND  PELVIS FINDINGS Hepatobiliary: No focal hepatic abnormality.  Prior cholecystectomy. Pancreas: No focal abnormality or ductal dilatation. Spleen: Normal size. Scattered small hypodensities throughout the liver, likely small cysts. Adrenals/Urinary Tract: New small enhancing lesion off the lower pole of the right kidney measures 9 mm concerning for a small solid renal lesion. No hydronephrosis. Adrenal glands and urinary bladder grossly unremarkable. Stomach/Bowel: Stomach, large and small bowel grossly unremarkable. Vascular/Lymphatic: No evidence of abdominal aortic aneurysm. Maximum aortic diameter 2.7 cm proximally. Mesenteric vessels and single renal arteries are widely patent. No adenopathy. Reproductive: Prior hysterectomy.  No adnexal masses. Other: No free fluid or free air. Musculoskeletal: Severe compression fracture of the L1 vertebral body, progressed since prior study. Review of the MIP images confirms the above findings. IMPRESSION: Prior tube graft repair of the ascending thoracic aorta with stable aneurysmal dilatation of the distal ascending aorta, measuring 5.2 cm. Stable mild aneurysmal dilatation of the proximal descending thoracic aorta, and ectasia and tortuosity of the descending thoracic aorta. No evidence of abdominal aortic aneurysm. Tiny exophytic lesion off the lower pole of the right kidney which appears to be enhancing and solid, 9 mm. This could be further evaluated with MRI or renal protocol CT. Electronically Signed   By: Rolm Baptise M.D.   On: 10/07/2015 13:19    Assessment & Plan:   There are no diagnoses linked to this encounter. I am having Ms. Hannig maintain her aspirin EC, rOPINIRole, oxyCODONE, and VAGIFEM.  No orders of the defined types were placed in this encounter.    Follow-up: No Follow-up on file.  Walker Kehr, MD

## 2016-02-25 NOTE — Assessment & Plan Note (Signed)
Doing fair 

## 2016-02-25 NOTE — Assessment & Plan Note (Signed)
Pt refused Rx

## 2016-02-25 NOTE — Progress Notes (Signed)
Pre visit review using our clinic review tool, if applicable. No additional management support is needed unless otherwise documented below in the visit note. 

## 2016-02-25 NOTE — Assessment & Plan Note (Signed)
Re-start vit B12

## 2016-02-25 NOTE — Assessment & Plan Note (Signed)
Labs; f/u w/Dr Marval Regal

## 2016-02-25 NOTE — Assessment & Plan Note (Signed)
Re-start Vit D 

## 2016-03-10 ENCOUNTER — Ambulatory Visit: Payer: Medicare Other | Admitting: Rheumatology

## 2016-03-13 ENCOUNTER — Other Ambulatory Visit: Payer: Self-pay | Admitting: *Deleted

## 2016-03-13 DIAGNOSIS — I712 Thoracic aortic aneurysm, without rupture, unspecified: Secondary | ICD-10-CM

## 2016-04-15 ENCOUNTER — Other Ambulatory Visit: Payer: Medicare Other

## 2016-04-15 ENCOUNTER — Encounter: Payer: Medicare Other | Admitting: Surgery

## 2016-04-21 ENCOUNTER — Other Ambulatory Visit: Payer: Self-pay | Admitting: Internal Medicine

## 2016-05-27 ENCOUNTER — Ambulatory Visit (INDEPENDENT_AMBULATORY_CARE_PROVIDER_SITE_OTHER): Payer: Medicare HMO | Admitting: Surgery

## 2016-05-27 ENCOUNTER — Ambulatory Visit
Admission: RE | Admit: 2016-05-27 | Discharge: 2016-05-27 | Disposition: A | Discharge status: Home / Self Care | Payer: Medicare HMO | Source: Ambulatory Visit | Attending: Surgery | Admitting: Surgery

## 2016-05-27 ENCOUNTER — Other Ambulatory Visit: Payer: Self-pay | Admitting: *Deleted

## 2016-05-27 VITALS — BP 126/78 | HR 62 | Resp 20 | Ht 60.0 in | Wt 150.0 lb

## 2016-05-27 DIAGNOSIS — I712 Thoracic aortic aneurysm, without rupture, unspecified: Secondary | ICD-10-CM

## 2016-05-27 DIAGNOSIS — Z952 Presence of prosthetic heart valve: Secondary | ICD-10-CM

## 2016-05-27 DIAGNOSIS — I77819 Aortic ectasia, unspecified site: Secondary | ICD-10-CM | POA: Diagnosis not present

## 2016-05-27 DIAGNOSIS — I7122 Aneurysm of the aortic arch, without rupture: Secondary | ICD-10-CM

## 2016-05-27 DIAGNOSIS — I719 Aortic aneurysm of unspecified site, without rupture: Secondary | ICD-10-CM

## 2016-05-27 MED ORDER — IOPAMIDOL (ISOVUE-370) INJECTION 76%
75.0000 mL | Freq: Once | INTRAVENOUS | Status: AC | PRN
  Administered 2016-05-27: 75 mL via INTRAVENOUS

## 2016-05-31 ENCOUNTER — Encounter: Payer: Self-pay | Admitting: Surgery

## 2016-05-31 NOTE — Progress Notes (Signed)
HPI:  The patient is a 82 year old woman with rheumatoid arthritis, fibromyalgia, IBS, DM and multiple other medial problems  who underwent Bentall procedure using a composite pericardial valve/conduit and replacement of the ascending aorta and proximal aortic arch on 09/29/2012. Her ascending aorta was 5.8 cm at that time. The aorta had increased in size from 4.5 cm in 2009. She was getting some chest pains and back pain, but it was difficult to tell if this was due to her aneurysm or due to her fibromyalgia and arthritis. The distal anastomosis was done just proximal to the innominate artery where the aorta appeared to come back to more normal size of about 3 cm. She had a follow up CT of the chest on 01/27/2013 that showed the aortic arch to have a diameter beyond the repair of 4.8 cm and the descending aorta was 3.7 cm. A follow up scan on 02/14/2014 showed the maximal diameter of the aortic arch to be 4.9 cm. A CTA on 03/25/2015 showed the diameter of the aortic arch to have increased further to 5.3 cm. In January 2017 she started having back pain that progressed and she had an MRI showing discitis at the L1-2 space, osteomyelitis and epidural phlegmon. She was treated with antibiotics and had a disc aspiration but nothing grew out. When I saw her in June 2017 the aneurysm of the distal ascending aorta and proximal arch was stable at 5.2 cm. Her lumbar MR from 09/22/15 showed continued discitis/osteomyelitis at L1-2 with resolution of the epidural abscess/phlegmon. I thought it was best to continue following this for a while due to the question of continued infection. Since I last saw her she has been feeling ok. She does have some chronic back pain but denies any fever or chills. She is still fairly active.  Current Outpatient Prescriptions  Medication Sig Dispense Refill  . aspirin EC 81 MG tablet Take 81 mg by mouth daily.    . Cholecalciferol (VITAMIN D3) 2000 units capsule Take 1 capsule  (2,000 Units total) by mouth daily. 100 capsule 3  . Cyanocobalamin 1000 MCG SUBL Place 1 tablet (1,000 mcg total) under the tongue daily. 100 tablet 11  . oxyCODONE (OXYCONTIN) 10 mg 12 hr tablet Take 1 tablet (10 mg total) by mouth 2 (two) times daily. Please fill on 05/05/16 60 tablet 0  . rOPINIRole (REQUIP) 2 MG tablet TAKE ONE TABLET BY MOUTH THREE TIMES DAILY 90 tablet 5  . VAGIFEM 10 MCG TABS vaginal tablet INSERT 1 TABLET VAGINALLY EVERY TWO DAYS 30 tablet 5   No current facility-administered medications for this visit.      Physical Exam: BP 126/78   Pulse 62   Resp 20   Ht 5' (1.524 m)   Wt 150 lb (68 kg)   SpO2 96%   BMI 29.29 kg/m  She looks well Cardiac exam shows a regular rate and rhythm with a 2/6 systolic murmur at the RSB. Lungs are clear  Diagnostic Tests:  CT ANGIO CHEST AORTA W &/OR WO CONTRAST (Accession [id number]) (Order [id number])  Imaging  Date: 05/27/2016 Department: [facility] Released By: Shelda Pal Authorizing: Gaye Pollack, MD  Exam Information   Status Exam Begun  Exam Ended   Final [99] 05/27/2016 12:29 PM 05/27/2016 1:18 PM  PACS Images   Show images for CT ANGIO CHEST AORTA W &/OR WO CONTRAST  Study Result   CLINICAL DATA:  82 year old female with history of  thoracic aortic aneurysm. History of aortic valve replacement. Followup study.  EXAM: CT ANGIOGRAPHY CHEST WITH CONTRAST  TECHNIQUE: Multidetector CT imaging of the chest was performed using the standard protocol during bolus administration of intravenous contrast. Multiplanar CT image reconstructions and MIPs were obtained to evaluate the vascular anatomy.  CONTRAST:  75 mL of Isovue 370.  COMPARISON:  Chest CT 10/07/2015.  FINDINGS: Cardiovascular: Heart size is normal. There is no significant pericardial fluid, thickening or pericardial calcification. Status post median sternotomy for aortic root replacement) stented  bio prosthesis) and graft replacement of the ascending thoracic aorta. There continues to be severe aneurysmal dilatation of the distal ascending thoracic aorta which measures up to 5.6 cm in diameter (image 38 of series 4), increased from 5.2 cm in diameter on the prior examination 10/07/2015. This aneurysmal dilatation extends into the aortic arch which measures up to 3.5 cm in diameter proximally and 3.9 cm in diameter distally. Descending thoracic aorta is ectatic measuring up to 3.5 cm in diameter. No aortic dissection identified.  Mediastinum/Nodes: No pathologically enlarged mediastinal or hilar lymph nodes. Esophagus is unremarkable in appearance. Mildly enlarged right axillary lymph node measuring 11 mm in short axis is new compared to the prior study. No left axillary lymphadenopathy. Abandoned epicardial pacing leads extending into the subcutaneous fat overlying the epigastric region.  Lungs/Pleura: 3 mm subpleural nodule associated with the minor fissure (image 57 of series 5) is unchanged, presumably a subpleural lymph node. No other larger more suspicious appearing pulmonary nodules or masses are otherwise noted. No acute consolidative airspace disease. No pleural effusions. Small right-sided Bochdalek's hernia.  Upper Abdomen: Aortic atherosclerosis.  Status post cholecystectomy.  Musculoskeletal: Median sternotomy wires. Old compression fractures of T3 (60% loss of anterior vertebral body height) and L1 (90% loss of anterior vertebral body height), similar to prior studies. There are no aggressive appearing lytic or blastic lesions noted in the visualized portions of the skeleton.  Review of the MIP images confirms the above findings.  IMPRESSION: 1. Progressive enlargement of what is now a 5.6 cm aneurysm of the distal ascending thoracic aorta, with additional aneurysmal dilatation of the aortic arch, as detailed above. On 10/07/2015 this aneurysm  measured only 5.2 cm in diameter. Recommend semi-annual imaging followup by CTA or MRA and continued surveillance by Cardiothoracic Surgery. This recommendation follows 2010 ACCF/AHA/AATS/ACR/ASA/SCA/SCAI/SIR/STS/SVM Guidelines for the Diagnosis and Management of Patients With Thoracic Aortic Disease. Circulation. 2010; 121: e266-e369TAA. 2. Status post median sternotomy for aortic root replacement and graft replacement of the proximal ascending thoracic aorta. 3. Additional incidental findings, as above.   Electronically Signed   By: Vinnie Langton M.D.   On: 05/27/2016 13:52      Impression:  There has been progressive enlargement of the distal ascending and arch aneurysm to 5.6 cm from 5.2 cm in June 2017. This will most likely continue to enlarge and rupture if not repaired. She is 82 years old with rheumatoid arthritis, stage III CKD,  fibromyalgia, IBS, DM and multiple other medial problems but continues to remain active. Surgical repair of this is the best option for her and I think she is still an operative candidate despite the risk. The options are debranching of the arch and stent grafting vs open arch replacement under circulatory arrest. Debranching and stent grafting would still require a redo sternotomy and grafting from the proximal aortic graft up to the innominate and left carotid with a carotid to left subclavian bypass or transposition. This is a difficult surgery  under redo circumstances with a large arch aneurysm distorting the arch vessels and making exposure difficulty. Then she would require stent grafting which may be difficult due to the angulation of the aorta. Open repair may not be any more difficult but has the risk of circulatory arrest in an 82 year old pt. I will review her CT scans with Dr. Trula Slade to see what he thinks. She had a cardiac cath in 2014 before her surgery and she had normal coronaries so I don't think a repeat is necessary especially with her  renal dysfunction. She will require a repeat echo to assess her aortic valve prosthesis and her LV function.  Plan:  I will discuss her case with Dr. Trula Slade and will get an echo. I will see her back after that to make further surgical plans.   Gaye Pollack, MD [facility] [phone]

## 2016-06-05 ENCOUNTER — Telehealth: Payer: Self-pay | Admitting: Internal Medicine

## 2016-06-05 ENCOUNTER — Ambulatory Visit (INDEPENDENT_AMBULATORY_CARE_PROVIDER_SITE_OTHER): Payer: Medicare HMO | Admitting: Internal Medicine

## 2016-06-05 ENCOUNTER — Encounter: Payer: Self-pay | Admitting: Internal Medicine

## 2016-06-05 DIAGNOSIS — M359 Systemic involvement of connective tissue, unspecified: Secondary | ICD-10-CM

## 2016-06-05 DIAGNOSIS — J069 Acute upper respiratory infection, unspecified: Secondary | ICD-10-CM | POA: Insufficient documentation

## 2016-06-05 DIAGNOSIS — E538 Deficiency of other specified B group vitamins: Secondary | ICD-10-CM

## 2016-06-05 DIAGNOSIS — G2581 Restless legs syndrome: Secondary | ICD-10-CM | POA: Diagnosis not present

## 2016-06-05 DIAGNOSIS — J042 Acute laryngotracheitis: Secondary | ICD-10-CM

## 2016-06-05 DIAGNOSIS — I712 Thoracic aortic aneurysm, without rupture, unspecified: Secondary | ICD-10-CM

## 2016-06-05 MED ORDER — DIFLUCAN 150 MG PO TABS: 150.0000 mg | ORAL_TABLET | Freq: Once | ORAL | 1 refills | Status: AC

## 2016-06-05 MED ORDER — OXYCODONE HCL ER 10 MG PO T12A: 10.0000 mg | EXTENDED_RELEASE_TABLET | Freq: Two times a day (BID) | ORAL | 0 refills | Status: DC

## 2016-06-05 MED ORDER — ZITHROMAX Z-PAK 250 MG PO TABS: ORAL_TABLET | ORAL | 0 refills | Status: DC

## 2016-06-05 NOTE — Assessment & Plan Note (Signed)
On B12 

## 2016-06-05 NOTE — Progress Notes (Signed)
Pre visit review using our clinic review tool, if applicable. No additional management support is needed unless otherwise documented below in the visit note. 

## 2016-06-05 NOTE — Assessment & Plan Note (Signed)
ASA

## 2016-06-05 NOTE — Assessment & Plan Note (Signed)
Pt is thinking of going to [facility]

## 2016-06-05 NOTE — Telephone Encounter (Signed)
Left vm for patient to call back to let Stef know what brand yeast infection medication she would like.

## 2016-06-05 NOTE — Assessment & Plan Note (Signed)
Zpac Diflucan

## 2016-06-05 NOTE — Assessment & Plan Note (Signed)
Requip  ?

## 2016-06-05 NOTE — Progress Notes (Signed)
Subjective:  Patient ID: Diane Abbott, female    DOB: 1935/01/23  Age: 82 y.o. MRN: [id number]  CC: No chief complaint on file.   HPI Diane Abbott presents for CTD, chronic pain, RLS. C/o URI sxs x 1 wk - worse...  Outpatient Medications Prior to Visit  Medication Sig Dispense Refill  . aspirin EC 81 MG tablet Take 81 mg by mouth daily.    . Cholecalciferol (VITAMIN D3) 2000 units capsule Take 1 capsule (2,000 Units total) by mouth daily. 100 capsule 3  . Cyanocobalamin 1000 MCG SUBL Place 1 tablet (1,000 mcg total) under the tongue daily. 100 tablet 11  . oxyCODONE (OXYCONTIN) 10 mg 12 hr tablet Take 1 tablet (10 mg total) by mouth 2 (two) times daily. Please fill on 05/05/16 60 tablet 0  . rOPINIRole (REQUIP) 2 MG tablet TAKE ONE TABLET BY MOUTH THREE TIMES DAILY 90 tablet 5  . VAGIFEM 10 MCG TABS vaginal tablet INSERT 1 TABLET VAGINALLY EVERY TWO DAYS 30 tablet 5   No facility-administered medications prior to visit.     ROS Review of Systems  Constitutional: Positive for fatigue and fever. Negative for activity change, appetite change, chills and unexpected weight change.  HENT: Positive for congestion, mouth sores, sinus pressure, sore throat and voice change.   Eyes: Negative for visual disturbance.  Respiratory: Positive for cough. Negative for chest tightness and shortness of breath.   Gastrointestinal: Negative for abdominal pain and nausea.  Genitourinary: Negative for difficulty urinating, frequency and vaginal pain.  Musculoskeletal: Positive for arthralgias and back pain. Negative for gait problem.  Skin: Negative for pallor and rash.  Neurological: Negative for dizziness, tremors, weakness, numbness and headaches.  Psychiatric/Behavioral: Negative for confusion and sleep disturbance. The patient is nervous/anxious.     Objective:  BP 130/74   Pulse 84   Temp 98.8 F (37.1 C) (Oral)   Resp 20   Wt 146 lb (66.2 kg)   SpO2 98%   BMI 28.51 kg/m   BP  Readings from Last 3 Encounters:  06/05/16 130/74  05/27/16 126/78  02/25/16 130/80    Wt Readings from Last 3 Encounters:  06/05/16 146 lb (66.2 kg)  05/27/16 150 lb (68 kg)  02/25/16 152 lb (68.9 kg)    Physical Exam  Constitutional: She appears well-developed. No distress.  HENT:  Head: Normocephalic.  Right Ear: External ear normal.  Left Ear: External ear normal.  Nose: Nose normal.  Mouth/Throat: Oropharynx is clear and moist.  Eyes: Conjunctivae are normal. Pupils are equal, round, and reactive to light. Right eye exhibits no discharge. Left eye exhibits no discharge.  Neck: Normal range of motion. Neck supple. No JVD present. No tracheal deviation present. No thyromegaly present.  Cardiovascular: Normal rate and regular rhythm.   Murmur heard. Pulmonary/Chest: No stridor. No respiratory distress. She has no wheezes.  Abdominal: Soft. Bowel sounds are normal. She exhibits no distension and no mass. There is no tenderness. There is no rebound and no guarding.  Musculoskeletal: She exhibits tenderness. She exhibits no edema.  Lymphadenopathy:    She has no cervical adenopathy.  Neurological: She displays normal reflexes. No cranial nerve deficit. She exhibits normal muscle tone. Coordination normal.  Skin: No rash noted. No erythema.  Psychiatric: She has a normal mood and affect. Her behavior is normal. Judgment and thought content normal.  eryth throat hoarse  Lab Results  Component Value Date   WBC 3.9 11/11/2015   HGB 10.6 (L) 11/11/2015  HCT 32.1 (L) 11/11/2015   HCT 31.4 (L) 11/11/2015   PLT 134 (L) 11/11/2015   GLUCOSE 93 02/25/2016   ALT 8 02/25/2016   AST 15 02/25/2016   NA 141 02/25/2016   K 4.3 02/25/2016   CL 106 02/25/2016   CREATININE 1.63 (H) 02/25/2016   BUN 33 (H) 02/25/2016   CO2 28 02/25/2016   TSH 2.88 01/09/2011   INR 1.20 07/25/2015   HGBA1C 6.2 (H) 08/08/2015    Ct Angio Chest Aorta W &/or Wo Contrast  Result Date:  05/27/2016 CLINICAL DATA:  82 year old female with history of thoracic aortic aneurysm. History of aortic valve replacement. Followup study. EXAM: CT ANGIOGRAPHY CHEST WITH CONTRAST TECHNIQUE: Multidetector CT imaging of the chest was performed using the standard protocol during bolus administration of intravenous contrast. Multiplanar CT image reconstructions and MIPs were obtained to evaluate the vascular anatomy. CONTRAST:  75 mL of Isovue 370. COMPARISON:  Chest CT 10/07/2015. FINDINGS: Cardiovascular: Heart size is normal. There is no significant pericardial fluid, thickening or pericardial calcification. Status post median sternotomy for aortic root replacement) stented bio prosthesis) and graft replacement of the ascending thoracic aorta. There continues to be severe aneurysmal dilatation of the distal ascending thoracic aorta which measures up to 5.6 cm in diameter (image 38 of series 4), increased from 5.2 cm in diameter on the prior examination 10/07/2015. This aneurysmal dilatation extends into the aortic arch which measures up to 3.5 cm in diameter proximally and 3.9 cm in diameter distally. Descending thoracic aorta is ectatic measuring up to 3.5 cm in diameter. No aortic dissection identified. Mediastinum/Nodes: No pathologically enlarged mediastinal or hilar lymph nodes. Esophagus is unremarkable in appearance. Mildly enlarged right axillary lymph node measuring 11 mm in short axis is new compared to the prior study. No left axillary lymphadenopathy. Abandoned epicardial pacing leads extending into the subcutaneous fat overlying the epigastric region. Lungs/Pleura: 3 mm subpleural nodule associated with the minor fissure (image 57 of series 5) is unchanged, presumably a subpleural lymph node. No other larger more suspicious appearing pulmonary nodules or masses are otherwise noted. No acute consolidative airspace disease. No pleural effusions. Small right-sided Bochdalek's hernia. Upper Abdomen:  Aortic atherosclerosis.  Status post cholecystectomy. Musculoskeletal: Median sternotomy wires. Old compression fractures of T3 (60% loss of anterior vertebral body height) and L1 (90% loss of anterior vertebral body height), similar to prior studies. There are no aggressive appearing lytic or blastic lesions noted in the visualized portions of the skeleton. Review of the MIP images confirms the above findings. IMPRESSION: 1. Progressive enlargement of what is now a 5.6 cm aneurysm of the distal ascending thoracic aorta, with additional aneurysmal dilatation of the aortic arch, as detailed above. On 10/07/2015 this aneurysm measured only 5.2 cm in diameter. Recommend semi-annual imaging followup by CTA or MRA and continued surveillance by Cardiothoracic Surgery. This recommendation follows 2010 ACCF/AHA/AATS/ACR/ASA/SCA/SCAI/SIR/STS/SVM Guidelines for the Diagnosis and Management of Patients With Thoracic Aortic Disease. Circulation. 2010; 121: e266-e369TAA. 2. Status post median sternotomy for aortic root replacement and graft replacement of the proximal ascending thoracic aorta. 3. Additional incidental findings, as above. Electronically Signed   By: Vinnie Langton M.D.   On: 05/27/2016 13:52    Assessment & Plan:   There are no diagnoses linked to this encounter. I am having Ms. Rao maintain her aspirin EC, VAGIFEM, Vitamin D3, Cyanocobalamin, oxyCODONE, and rOPINIRole.  No orders of the defined types were placed in this encounter.    Follow-up: No Follow-up on file.  Walker Kehr, MD

## 2016-06-05 NOTE — Assessment & Plan Note (Signed)
Oxy   Potential benefits of a long term opioids use as well as potential risks (i.e. addiction risk, apnea etc) and complications (i.e. Somnolence, constipation and others) were explained to the patient and were aknowledged.  

## 2016-06-08 ENCOUNTER — Other Ambulatory Visit: Payer: Self-pay | Admitting: Internal Medicine

## 2016-06-11 ENCOUNTER — Other Ambulatory Visit: Payer: Self-pay

## 2016-06-11 ENCOUNTER — Telehealth: Payer: Self-pay | Admitting: *Deleted

## 2016-06-11 ENCOUNTER — Ambulatory Visit (HOSPITAL_COMMUNITY): Payer: Medicare HMO | Attending: Cardiology

## 2016-06-11 DIAGNOSIS — I359 Nonrheumatic aortic valve disorder, unspecified: Secondary | ICD-10-CM | POA: Insufficient documentation

## 2016-06-11 DIAGNOSIS — Z952 Presence of prosthetic heart valve: Secondary | ICD-10-CM | POA: Insufficient documentation

## 2016-06-11 NOTE — Telephone Encounter (Signed)
Rec'd PA for pt Oxycodone. Completed & faxed back to insurance. Waiting on approval status...Johny Chess

## 2016-06-15 ENCOUNTER — Other Ambulatory Visit: Payer: Self-pay | Admitting: *Deleted

## 2016-06-15 DIAGNOSIS — I7122 Aneurysm of the aortic arch, without rupture: Secondary | ICD-10-CM

## 2016-06-15 DIAGNOSIS — I712 Thoracic aortic aneurysm, without rupture: Secondary | ICD-10-CM

## 2016-06-15 NOTE — Anesthesia Preprocedure Evaluation (Addendum)
Anesthesia Evaluation  Patient identified by MRN, date of birth, ID band Patient awake    Reviewed: Allergy & Precautions, NPO status , Patient's Chart, lab work & pertinent test results  History of Anesthesia Complications Negative for: history of anesthetic complications  Airway Mallampati: II  TM Distance: >3 FB Neck ROM: Full    Dental  (+) Teeth Intact, Dental Advisory Given   Pulmonary shortness of breath, COPD,    breath sounds clear to auscultation       Cardiovascular + Peripheral Vascular Disease   Rhythm:Regular     Neuro/Psych PSYCHIATRIC DISORDERS Anxiety Depression  Neuromuscular disease    GI/Hepatic hiatal hernia, GERD  ,  Endo/Other  diabetes  Renal/GU Renal InsufficiencyRenal disease     Musculoskeletal  (+) Arthritis , Fibromyalgia -  Abdominal   Peds  Hematology  (+) Blood dyscrasia, anemia ,   Anesthesia Other Findings   Reproductive/Obstetrics                           Anesthesia Physical Anesthesia Plan  ASA: IV  Anesthesia Plan: General   Post-op Pain Management:    Induction: Intravenous  Airway Management Planned: Oral ETT  Additional Equipment: TEE, CVP, PA Cath, Ultrasound Guidance Line Placement and Arterial line  Intra-op Plan:   Post-operative Plan: Post-operative intubation/ventilation  Informed Consent: I have reviewed the patients History and Physical, chart, labs and discussed the procedure including the risks, benefits and alternatives for the proposed anesthesia with the patient or authorized representative who has indicated his/her understanding and acceptance.   Dental advisory given  Plan Discussed with: CRNA, Surgeon and Anesthesiologist  Anesthesia Plan Comments: (Per [facility] RN Ryan, Dr. Cyndia Bent needs right axillary cannulation and bilateral radial a-lines. Myra Gianotti, PA-C 06/15/16 12:56 PM)      Anesthesia Quick Evaluation

## 2016-06-15 NOTE — Telephone Encounter (Signed)
Rec'd PA back med has been approved through 05/03/17...Diane Abbott

## 2016-06-23 ENCOUNTER — Encounter (HOSPITAL_COMMUNITY): Payer: Self-pay

## 2016-06-23 NOTE — Pre-Procedure Instructions (Signed)
Diane Abbott  06/23/2016       Your procedure is scheduled on Monday February 26.  Report to [facility] Admitting at 5:30 A.M.  Call this number if you have problems the morning of surgery:  [phone]   Remember:  Do not eat food or drink liquids after midnight.  Take these medicines the morning of surgery with A SIP OF WATER: oxycodone if needed, ropinirole (Requip)  STOP TAKING pseudoephedrine (Aleve-D Sinus and Cold PO)  7 days prior to surgery STOP taking any Aspirin, Aleve, Naproxen, Ibuprofen, Motrin, Advil, Goody's, BC's, all herbal medications, fish oil, and all vitamins    Do not wear jewelry, make-up or nail polish.  Do not wear lotions, powders, or perfumes, or deoderant.  Do not shave 48 hours prior to surgery.  Men may shave face and neck.  Do not bring valuables to the hospital.  [facility] is not responsible for any belongings or valuables.  Contacts, dentures or bridgework may not be worn into surgery.  Leave your suitcase in the car.  After surgery it may be brought to your room.  For patients admitted to the hospital, discharge time will be determined by your treatment team.  Patients discharged the day of surgery will not be allowed to drive home.    Special instructions:    [facility]- Preparing For Surgery  Before surgery, you can play an important role. Because skin is not sterile, your skin needs to be as free of germs as possible. You can reduce the number of germs on your skin by washing with CHG (chlorahexidine gluconate) Soap before surgery.  CHG is an antiseptic cleaner which kills germs and bonds with the skin to continue killing germs even after washing.  Please do not use if you have an allergy to CHG or antibacterial soaps. If your skin becomes reddened/irritated stop using the CHG.  Do not shave (including legs and underarms) for at least 48 hours prior to first CHG shower. It is OK to shave your face.  Please follow  these instructions carefully.   1. Shower the NIGHT BEFORE SURGERY and the MORNING OF SURGERY with CHG.   2. If you chose to wash your hair, wash your hair first as usual with your normal shampoo.  3. After you shampoo, rinse your hair and body thoroughly to remove the shampoo.  4. Use CHG as you would any other liquid soap. You can apply CHG directly to the skin and wash gently with a scrungie or a clean washcloth.   5. Apply the CHG Soap to your body ONLY FROM THE NECK DOWN.  Do not use on open wounds or open sores. Avoid contact with your eyes, ears, mouth and genitals (private parts). Wash genitals (private parts) with your normal soap.  6. Wash thoroughly, paying special attention to the area where your surgery will be performed.  7. Thoroughly rinse your body with warm water from the neck down.  8. DO NOT shower/wash with your normal soap after using and rinsing off the CHG Soap.  9. Pat yourself dry with a CLEAN TOWEL.   10. Wear CLEAN PAJAMAS   11. Place CLEAN SHEETS on your bed the night of your first shower and DO NOT SLEEP WITH PETS.    Day of Surgery: Do not apply any deodorants/lotions. Please wear clean clothes to the hospital/surgery center.      Please read over the following fact sheets that you were given. MRSA  Information

## 2016-06-24 ENCOUNTER — Ambulatory Visit (HOSPITAL_COMMUNITY)
Admission: RE | Admit: 2016-06-24 | Discharge: 2016-06-24 | Disposition: A | Discharge status: Home / Self Care | Payer: Medicare HMO | Source: Ambulatory Visit | Attending: Surgery | Admitting: Surgery

## 2016-06-24 ENCOUNTER — Encounter (HOSPITAL_COMMUNITY): Payer: Self-pay

## 2016-06-24 ENCOUNTER — Encounter (HOSPITAL_COMMUNITY)
Admission: RE | Admit: 2016-06-24 | Discharge: 2016-06-24 | Disposition: A | Discharge status: Home / Self Care | Payer: Medicare HMO | Source: Ambulatory Visit | Attending: Surgery | Admitting: Surgery

## 2016-06-24 ENCOUNTER — Ambulatory Visit (HOSPITAL_BASED_OUTPATIENT_CLINIC_OR_DEPARTMENT_OTHER)
Admission: RE | Admit: 2016-06-24 | Discharge: 2016-06-24 | Disposition: A | Discharge status: Home / Self Care | Payer: Medicare HMO | Source: Ambulatory Visit | Attending: Surgery | Admitting: Surgery

## 2016-06-24 DIAGNOSIS — I6523 Occlusion and stenosis of bilateral carotid arteries: Secondary | ICD-10-CM | POA: Insufficient documentation

## 2016-06-24 DIAGNOSIS — I7122 Aneurysm of the aortic arch, without rupture: Secondary | ICD-10-CM

## 2016-06-24 DIAGNOSIS — J40 Bronchitis, not specified as acute or chronic: Secondary | ICD-10-CM | POA: Diagnosis not present

## 2016-06-24 DIAGNOSIS — I712 Thoracic aortic aneurysm, without rupture: Secondary | ICD-10-CM | POA: Insufficient documentation

## 2016-06-24 DIAGNOSIS — Z952 Presence of prosthetic heart valve: Secondary | ICD-10-CM | POA: Insufficient documentation

## 2016-06-24 HISTORY — DX: Malignant (primary) neoplasm, unspecified: C80.1

## 2016-06-24 HISTORY — DX: Anemia, unspecified: D64.9

## 2016-06-24 HISTORY — DX: Disease of blood and blood-forming organs, unspecified: D75.9

## 2016-06-24 LAB — PULMONARY FUNCTION TEST
DL/VA % pred: 99 %
DL/VA: 4.08 ml/min/mmHg/L
DLCO UNC % PRED: 74 %
DLCO unc: 12.98 ml/min/mmHg
FEF 25-75 PRE: 1.63 L/s
FEF 25-75 Post: 1.52 L/sec
FEF2575-%Change-Post: -6 %
FEF2575-%PRED-POST: 134 %
FEF2575-%PRED-PRE: 143 %
FEV1-%CHANGE-POST: 0 %
FEV1-%Pred-Post: 123 %
FEV1-%Pred-Pre: 124 %
FEV1-PRE: 1.84 L
FEV1-Post: 1.83 L
FEV1FVC-%CHANGE-POST: 1 %
FEV1FVC-%PRED-PRE: 106 %
FEV6-%CHANGE-POST: -1 %
FEV6-%PRED-POST: 121 %
FEV6-%Pred-Pre: 123 %
FEV6-PRE: 2.33 L
FEV6-Post: 2.29 L
FEV6FVC-%Pred-Post: 106 %
FEV6FVC-%Pred-Pre: 106 %
FVC-%Change-Post: -1 %
FVC-%PRED-POST: 113 %
FVC-%Pred-Pre: 115 %
FVC-Post: 2.29 L
FVC-Pre: 2.33 L
POST FEV1/FVC RATIO: 80 %
Post FEV6/FVC ratio: 100 %
Pre FEV1/FVC ratio: 79 %
Pre FEV6/FVC Ratio: 100 %
RV % pred: 86 %
RV: 1.84 L
TLC % pred: 99 %
TLC: 4.29 L

## 2016-06-24 LAB — URINALYSIS, ROUTINE W REFLEX MICROSCOPIC
BACTERIA UA: NONE SEEN
Bilirubin Urine: NEGATIVE
Glucose, UA: NEGATIVE mg/dL
KETONES UR: NEGATIVE mg/dL
Nitrite: NEGATIVE
Protein, ur: NEGATIVE mg/dL
Specific Gravity, Urine: 1.023 (ref 1.005–1.030)
pH: 5 (ref 5.0–8.0)

## 2016-06-24 LAB — CBC
HEMATOCRIT: 34.6 % — AB (ref 36.0–46.0)
Hemoglobin: 11.4 g/dL — ABNORMAL LOW (ref 12.0–15.0)
MCH: 29.8 pg (ref 26.0–34.0)
MCHC: 32.9 g/dL (ref 30.0–36.0)
MCV: 90.3 fL (ref 78.0–100.0)
Platelets: 157 10*3/uL (ref 150–400)
RBC: 3.83 MIL/uL — ABNORMAL LOW (ref 3.87–5.11)
RDW: 13.4 % (ref 11.5–15.5)
WBC: 4.5 10*3/uL (ref 4.0–10.5)

## 2016-06-24 LAB — VAS US DOPPLER PRE CABG
LCCADDIAS: 19 cm/s
LEFT ECA DIAS: -7 cm/s
LEFT VERTEBRAL DIAS: 8 cm/s
LICADDIAS: 49 cm/s
LICADSYS: 137 cm/s
LICAPDIAS: -22 cm/s
LICAPSYS: -57 cm/s
Left CCA dist sys: 72 cm/s
Left CCA prox dias: -16 cm/s
Left CCA prox sys: -71 cm/s
RIGHT ECA DIAS: -9 cm/s
RIGHT VERTEBRAL DIAS: 8 cm/s
Right CCA prox dias: 17 cm/s
Right CCA prox sys: 69 cm/s
Right cca dist sys: -91 cm/s

## 2016-06-24 LAB — BLOOD GAS, ARTERIAL
Acid-Base Excess: 0.7 mmol/L (ref 0.0–2.0)
Bicarbonate: 24.9 mmol/L (ref 20.0–28.0)
Drawn by: 449841
FIO2: 21
O2 Saturation: 97.1 %
Patient temperature: 98.6
pCO2 arterial: 41.1 mmHg (ref 32.0–48.0)
pH, Arterial: 7.4 (ref 7.350–7.450)
pO2, Arterial: 98 mmHg (ref 83.0–108.0)

## 2016-06-24 LAB — APTT: APTT: 20 s — AB (ref 24–36)

## 2016-06-24 LAB — COMPREHENSIVE METABOLIC PANEL
ALT: 11 U/L — ABNORMAL LOW (ref 14–54)
AST: 16 U/L (ref 15–41)
Albumin: 3.6 g/dL (ref 3.5–5.0)
Alkaline Phosphatase: 88 U/L (ref 38–126)
Anion gap: 11 (ref 5–15)
BUN: 25 mg/dL — ABNORMAL HIGH (ref 6–20)
CO2: 21 mmol/L — ABNORMAL LOW (ref 22–32)
Calcium: 9.4 mg/dL (ref 8.9–10.3)
Chloride: 106 mmol/L (ref 101–111)
Creatinine, Ser: 1.25 mg/dL — ABNORMAL HIGH (ref 0.44–1.00)
GFR calc Af Amer: 46 mL/min — ABNORMAL LOW (ref >60)
GFR calc non Af Amer: 40 mL/min — ABNORMAL LOW (ref >60)
Glucose, Bld: 117 mg/dL — ABNORMAL HIGH (ref 65–99)
Potassium: 4 mmol/L (ref 3.5–5.1)
Sodium: 138 mmol/L (ref 135–145)
Total Bilirubin: 0.5 mg/dL (ref 0.3–1.2)
Total Protein: 7.7 g/dL (ref 6.5–8.1)

## 2016-06-24 LAB — SURGICAL PCR SCREEN
MRSA, PCR: POSITIVE — AB
Staphylococcus aureus: POSITIVE — AB

## 2016-06-24 LAB — PROTIME-INR
INR: 1.03
Prothrombin Time: 13.5 seconds (ref 11.4–15.2)

## 2016-06-24 MED ORDER — ALBUTEROL SULFATE (2.5 MG/3ML) 0.083% IN NEBU
2.5000 mg | INHALATION_SOLUTION | Freq: Once | RESPIRATORY_TRACT | Status: AC
  Administered 2016-06-24: 2.5 mg via RESPIRATORY_TRACT

## 2016-06-24 NOTE — Progress Notes (Signed)
Pre-op Cardiac Surgery  Carotid Findings:   Findings are consistent with a 1-39 percent stenosis involving the right internal carotid artery and the left internal carotid artery. The vertebral artery demonstrates antegrade flow.  Upper Extremity Right Left  Brachial Pressures 117  Triphasic 117  Triphasic  Radial Waveforms Triphasic Triphasic  Ulnar Waveforms Triphasic Triphasic  Palmar Arch (Allen's Test) Palmar waveforms remain within normal limits radial and ulnar compression. Palmar waveforms remain within normal limits radial compression and are obliterated with ulnar compression.

## 2016-06-24 NOTE — Progress Notes (Signed)
PCR positive for MRSA, prescription called to pharmacy, message left for patient.

## 2016-06-24 NOTE — Progress Notes (Addendum)
PCP: [facility] Cardiologist: Pt saw Dr. Aundra Dubin in the past  Echo: 2018 Cath: 2014 PFT: 06/24/16 Dopplars: 06/24/16 CXR: 06/24/16 EKG: 06/24/16  Pt states she is not diabetic and does not check her blood sugars at home. States she has never been told if she was diabetic.   Pt with no complaints of chest pain, SOB or signs of infection at PAT appointment. Pt states she recently had bronchitis but this is better now. Pt states she has left sided chest pain off and on every day, but has not had any today. Pt states this is d/t aneurysm and Dr. Cyndia Bent is aware. Anesthesia notified and message left with Thurmond Butts for Dr. Cyndia Bent.

## 2016-06-25 ENCOUNTER — Encounter (HOSPITAL_COMMUNITY): Payer: Self-pay

## 2016-06-25 ENCOUNTER — Other Ambulatory Visit (HOSPITAL_COMMUNITY): Payer: Medicare HMO

## 2016-06-25 LAB — HEMOGLOBIN A1C
Hgb A1c MFr Bld: 5.5 % (ref 4.8–5.6)
MEAN PLASMA GLUCOSE: 111

## 2016-06-25 NOTE — Progress Notes (Signed)
Anesthesia chart review: Patient is an 82 year old female scheduled for redo sternotomy, aortic arch replacement, right axillary artery cannulation, and bilateral radial artery line placement on 06/29/2016 by Dr. Cyndia Bent. DX: ascending aortic aneurysm with prior Bentall '14.   History includes never smoker, severe AI with ascending aortic aneurysm s/p Bentall procedure using a composite 26 mm Gelweave Valsalva graft and a 21 mm Edwards pericardial Magna-Ease valve) 09/29/12, chest pain (intermittently since 2012; reportedly felt due to TAA), RA, PE '02, fibromyalgia, h/o adrenal insufficiency, IBS, depression, GERD, vertigo, fatty liver, anemia, skin cancer, hysterectomy, cholecystectomy, tonsillectomy, appendectomy, bilateral TKA, left total shoulder, L1-2 discitis/osteomeylitis 05/2015 (s/p antibiotics; saw ID Dr. Linus Salmons), blood dyscrasia (pancyctopenia noted ~ 2016-2017).  PCP is Dr. Tyrone Apple Plotnikov. Cardiologist is Dr. Aundra Dubin, but last visit with Richardson Dopp, PA-C on 03/17/15. She has had intermittent left sided chest pains that she had prior to her Bentall procedure which had been attributed to her TAA (although possibly fibromyalgia and arthritis also contributing). Notes indicate that she has discussed this with Dr. Cyndia Bent. PAT RN also notified [facility] staff. As of  05/27/16, Dr. Cyndia Bent did not think that she needed a repeat cath prior to this surgery since her cath in 2014 showed no significant CAD.    Meds include ASA 81 mg, Levsin, Oxycontin.  EKG 06/24/16: NSR, non-specific T wave abnormality. She denied CP at PAT.   Echo 06/11/16: Impressions: - Normal LV systolic function; grade 1 diastolic dysfunction; mild   LVH; bioprosthetic aortic valve with mildly elevated mean   gradient compared to expected (21 mmHg); no AI; biatrial   enlargment; mild to moderate TR.  RHC/LHC 09/15/12: Coronary dominance: right - Left mainstem: No significant disease.  - Left anterior descending (LAD): Mild luminal  irregularities.  - Left circumflex (LCx): No significant disease.  There is a small to moderate ramus and a large PLOM.  - Right coronary artery (RCA): No significant disease.  Aortic root angiography: 3+ aortic insufficiency.  Aneurysmal dilatation from the sinotubular junction to the proximal aortic arch.  Final Conclusions:  No significant CAD.  3+ aortic insufficiency.  Recommendations: CT surgery (s/p Bentall procedure   Carotid U/S 06/24/16: Summary: Findings are consistent with a 1-39 percent stenosis involving the right internal carotid artery and the left internal carotid artery. The vertebral artery demonstrates antegrade flow.   CXR 06/24/16: IMPRESSION: 1. Prior cardiac valve replacement. Prominence of the ascending aorta and aortic arch again noted consist with aneurysmal dilatation. Similar findings noted on prior CT 05/27/2016. Reference to that report suggested. 2. No acute pulmonary disease .  CTA chest 05/27/16: IMPRESSION: 1. Progressive enlargement of what is now a 5.6 cm aneurysm of the distal ascending thoracic aorta, with additional aneurysmal dilatation of the aortic arch, as detailed above. On 10/07/2015 this aneurysm measured only 5.2 cm in diameter. Recommend semi-annual imaging followup by CTA or MRA and continued surveillance by Cardiothoracic Surgery. This recommendation follows 2010 ACCF/AHA/AATS/ACR/ASA/SCA/SCAI/SIR/STS/SVM Guidelines for the Diagnosis and Management of Patients With Thoracic Aortic Disease. Circulation. 2010; 121: [id number]. 2. Status post median sternotomy for aortic root replacement and graft replacement of the proximal ascending thoracic aorta. 3. Additional incidental findings, as above.  PFTs 06/24/16: FVC 2.33 (115%), FEV1 1.84 (124%), DLCOunc 12.98 (74%).   Preoperative labs noted. BUN 25, Cr 1.25. H/H 11.4/34.6. PT/PTT WNL. A1c 5.5. UA showed trace leukocytes, negative nitrites.   PAT results appear acceptable for OR.  Anesthesiologist and surgeon to evaluate on the day of surgery to ensure no acute changes  or CV symptoms prior to proceeding.  George [facility] Short Stay Center/Anesthesiology Phone [phone] 06/25/2016 3:28 PM

## 2016-06-28 MED ORDER — DEXMEDETOMIDINE HCL IN NACL 400 MCG/100ML IV SOLN
0.1000 ug/kg/h | INTRAVENOUS | Status: AC
  Administered 2016-06-29: .2 ug/kg/h via INTRAVENOUS
  Filled 2016-06-28: qty 100

## 2016-06-28 MED ORDER — DEXTROSE 5 % IV SOLN
0.0000 ug/min | INTRAVENOUS | Status: DC
  Filled 2016-06-28: qty 4

## 2016-06-28 MED ORDER — TRANEXAMIC ACID 1000 MG/10ML IV SOLN
1.5000 mg/kg/h | INTRAVENOUS | Status: AC
  Administered 2016-06-29: 9.6 mg/kg/h via INTRAVENOUS
  Filled 2016-06-28: qty 25

## 2016-06-28 MED ORDER — LEVOFLOXACIN IN D5W 500 MG/100ML IV SOLN
500.0000 mg | INTRAVENOUS | Status: AC
  Administered 2016-06-29: 250 mg via INTRAVENOUS
  Filled 2016-06-28: qty 100

## 2016-06-28 MED ORDER — SODIUM CHLORIDE 0.9 % IV SOLN
INTRAVENOUS | Status: DC
  Filled 2016-06-28: qty 30

## 2016-06-28 MED ORDER — MAGNESIUM SULFATE 50 % IJ SOLN
40.0000 meq | INTRAMUSCULAR | Status: DC
  Filled 2016-06-28: qty 10

## 2016-06-28 MED ORDER — DOPAMINE-DEXTROSE 3.2-5 MG/ML-% IV SOLN
0.0000 ug/kg/min | INTRAVENOUS | Status: DC
  Filled 2016-06-28: qty 250

## 2016-06-28 MED ORDER — PHENYLEPHRINE HCL 10 MG/ML IJ SOLN
30.0000 ug/min | INTRAMUSCULAR | Status: AC
  Administered 2016-06-29: 20 ug/min via INTRAVENOUS
  Filled 2016-06-28: qty 2

## 2016-06-28 MED ORDER — METOPROLOL TARTRATE 12.5 MG HALF TABLET
12.5000 mg | ORAL_TABLET | Freq: Once | ORAL | Status: AC
  Administered 2016-06-29: 12.5 mg via ORAL
  Filled 2016-06-28: qty 1

## 2016-06-28 MED ORDER — PAPAVERINE HCL 30 MG/ML IJ SOLN
INTRAMUSCULAR | Status: DC
  Filled 2016-06-28: qty 2.5

## 2016-06-28 MED ORDER — CHLORHEXIDINE GLUCONATE 0.12 % MT SOLN
15.0000 mL | Freq: Once | OROMUCOSAL | Status: AC
  Administered 2016-06-29: 15 mL via OROMUCOSAL
  Filled 2016-06-28: qty 15

## 2016-06-28 MED ORDER — POTASSIUM CHLORIDE 2 MEQ/ML IV SOLN
80.0000 meq | INTRAVENOUS | Status: DC
  Filled 2016-06-28: qty 40

## 2016-06-28 MED ORDER — NITROGLYCERIN IN D5W 200-5 MCG/ML-% IV SOLN
2.0000 ug/min | INTRAVENOUS | Status: DC
  Filled 2016-06-28: qty 250

## 2016-06-28 MED ORDER — SODIUM CHLORIDE 0.9 % IV SOLN
INTRAVENOUS | Status: AC
  Administered 2016-06-29: .8 [IU]/h via INTRAVENOUS
  Filled 2016-06-28: qty 2.5

## 2016-06-28 MED ORDER — TRANEXAMIC ACID (OHS) PUMP PRIME SOLUTION
2.0000 mg/kg | INTRAVENOUS | Status: DC
  Filled 2016-06-28: qty 1.27

## 2016-06-28 MED ORDER — VANCOMYCIN HCL 10 G IV SOLR
1250.0000 mg | INTRAVENOUS | Status: AC
  Administered 2016-06-29: 1250 mg via INTRAVENOUS
  Filled 2016-06-28: qty 1250

## 2016-06-28 MED ORDER — TRANEXAMIC ACID (OHS) BOLUS VIA INFUSION
15.0000 mg/kg | INTRAVENOUS | Status: AC
  Administered 2016-06-29: 955.5 mg via INTRAVENOUS
  Filled 2016-06-28: qty 956

## 2016-06-28 NOTE — H&P (Signed)
[address]       [place] [address]             [phone]      Cardiothoracic Surgery Admission History and Physical   Diane Abbott is an 82 y.o. female.   Chief Complaint: aortic arch aneurysm HPI:   The patient is a 82 year old woman with rheumatoid arthritis, fibromyalgia, IBS, DM and multiple other medial problems who underwent Bentall procedure using a composite pericardial valve/conduit and replacement of the ascending aorta and proximal aortic arch on 09/29/2012. Her ascending aorta was 5.8 cm at that time. The aorta had increased in size from 4.5 cm in 2009. She was getting some chest pains and back pain, but it was difficult to tell if this was due to her aneurysm or due to her fibromyalgia and arthritis. The distal anastomosis was done just proximal to the innominate artery where the aorta appeared to come back to more normal size of about 3 cm. She had a follow up CT of the chest on 01/27/2013 that showed the aortic arch to have a diameter beyond the repair of 4.8 cm and the descending aorta was 3.7 cm. A follow up scan on 02/14/2014 showed the maximal diameter of the aortic arch to be 4.9 cm. A CTA on 03/25/2015 showed the diameter of the aortic arch to have increased further to 5.3 cm. In January 2017 she started having back pain that progressed and she had an MRI showing discitis at the L1-2 space, osteomyelitis and epidural phlegmon. She was treated with antibiotics and had a disc aspiration but nothing grew out. When I saw her in June 2017 the aneurysm of the distal ascending aorta and proximal arch was stable at 5.2 cm. Her lumbar MR from 09/22/15 showed continued discitis/osteomyelitis at L1-2 with resolution of the epidural abscess/phlegmon. I thought it was best to continue following this for a while due to the question of continued infection. Since I last saw her she has been feeling ok. She does have some chronic back pain but denies any fever or  chills. She is still fairly active.  Her most recent echo on 06/11/2016 showed an EF of 55-60% with mild LVH. The aortic bioprosthesis was functioning normally. The mean gradient ws 21 mm Hg. Cardiac cath prior to her surgery in 2014 showed no significant coronary disease.   Past Medical History:  Diagnosis Date  . Adrenal insufficiency ([place])   . Anemia   . Anginal pain [facility])    "comes and goes has occured since 2012"  . Blood dyscrasia    "problems with platelets, red cells and white cells being low"  . Bronchitis Jan 2013-March 2013   severe  . Cancer ([place])    skin cancer on face  . Cataracts, bilateral    more in left than right  . Depression   . Esophagitis   . Fatty liver   . Fibromyalgia   . Fibromyalgia   . GERD (gastroesophageal reflux disease)   . Heart murmur   . History of blood clots 2008   below knee post trauma. Dilated [facility] no clotting disorder felt to be present.  . History of echocardiogram    Echo 11/16: Mild LVH, EF 60-65%, normal wall motion, grade 1 diastolic dysfunction, bioprosthetic AVR with mean gradient 21 mmHg and no regurgitation, proximal aortic arch aneurysm 49 mm, moderate TR, PASP 31 mmHg  . IBS (irritable bowel syndrome)   .  Leaky heart valve   . Osteoarthritis   . Osteoarthritis of left shoulder 12/07/2011  . Osteopenia   . Pulmonary embolism ([place])   . RA (rheumatoid arthritis) ([place])   . Shortness of breath    attributed to aneurysm  . Sliding hiatal hernia   . Urinary leakage    when coughing  . Vertigo    hx of  . Vitamin B 12 deficiency   . Vitamin D deficiency     Past Surgical History:  Procedure Laterality Date  . ABDOMINAL HYSTERECTOMY    . AORTA SURGERY  12/2012   Resection of aneurysm and valve replacement  . APPENDECTOMY  1956  . BENTALL PROCEDURE N/A 09/29/2012   Procedure: BENTALL PROCEDURE;  Surgeon: Gaye Pollack, MD;  Location: [facility];  Service: Open Heart Surgery;  Laterality: N/A;  WITH CIRC ARREST  .  BILATERAL OOPHORECTOMY    . BREAST BIOPSY Left   . CARDIAC CATHETERIZATION  09/15/12   no PCI  . CATARACT EXTRACTION Bilateral   . CHOLECYSTECTOMY  2003  . COLONOSCOPY    . COSMETIC SURGERY     Tummy tuck  . INTRAOPERATIVE TRANSESOPHAGEAL ECHOCARDIOGRAM N/A 09/29/2012   Procedure: INTRAOPERATIVE TRANSESOPHAGEAL ECHOCARDIOGRAM;  Surgeon: Gaye Pollack, MD;  Location: [facility] OR;  Service: Open Heart Surgery;  Laterality: N/A;  . JOINT REPLACEMENT Bilateral   . SKIN CANCER EXCISION Right    cheek  . TONSILLECTOMY  1941  . TOTAL KNEE ARTHROPLASTY     bilateral  . TOTAL SHOULDER ARTHROPLASTY  12/07/2011   Procedure: TOTAL SHOULDER ARTHROPLASTY;  Surgeon: Johnny Bridge, MD;  Location: [facility];  Service: Orthopedics;  Laterality: Left;  left total shoulder artthroplasty  . WRIST SURGERY     bilateral    Family History  Problem Relation Age of Onset  . Ovarian cancer Mother   . Diabetes Mother   . COPD Father   . Heart disease Maternal Grandmother   . Clotting disorder      Father, brother, sister, and daughter all had deep venous thrombosis  . Breast cancer    . Pancreatic cancer Paternal Uncle   . Colon cancer Neg Hx    Social History:  reports that she has never smoked. She has never used smokeless tobacco. She reports that she drinks alcohol. She reports that she does not use drugs.  Allergies:  Allergies  Allergen Reactions  . Codeine Other (See Comments)    Blood pressure drops; "I pass out."  . Pramipexole Dihydrochloride Nausea Only    sick, falling  . Rofecoxib Other (See Comments)    "sleep walks"  . Acetaminophen   . Arava [Leflunomide] Swelling  . Clonazepam     unknown  . Diclofenac Sodium     unknown  . Venlafaxine   . Alprazolam Other (See Comments)    "Makes me mean"  . Darvocet [Propoxyphene N-Acetaminophen] Other (See Comments)    "makes my eyes dilate. Pt stated I can't see"  . Duloxetine Other (See Comments)    achy legs  . Gabapentin Other (See  Comments)    headache  . Latex Itching and Dermatitis    When examined with latex gloves, burns and itches in contact areas per pt.  . Morphine And Related Other (See Comments)    Hallucinations   . Nortriptyline Hcl Other (See Comments)    Migraines   . Penicillins Rash    Has patient had a PCN reaction causing immediate rash, facial/tongue/throat swelling, SOB or  lightheadedness with hypotension: Yes Has patient had a PCN reaction causing severe rash involving mucus membranes or skin necrosis: Yes Has patient had a PCN reaction that required hospitalization No Has patient had a PCN reaction occurring within the last 10 years: Yes If all of the above answers are "NO", then may proceed with Cephalosporin use.   Can take Cephalosporins  . Prednisone Swelling    Just doesn't want to take it.    No prescriptions prior to admission.    No results found for this or any previous visit (from the past 48 hour(s)). No results found.  Review of Systems  Constitutional: Negative for chills, fever, malaise/fatigue and weight loss.  HENT: Negative.   Eyes: Negative.   Respiratory: Negative.   Cardiovascular: Negative for chest pain, orthopnea, leg swelling and PND.  Gastrointestinal: Negative.   Genitourinary: Negative.   Musculoskeletal: Positive for back pain and neck pain.  Skin: Negative.   Neurological: Negative.   Endo/Heme/Allergies: Negative.   Psychiatric/Behavioral: The patient is nervous/anxious.     There were no vitals taken for this visit. Physical Exam  Constitutional: She is oriented to person, place, and time. She appears well-developed and well-nourished. No distress.  HENT:  Head: Atraumatic.  Mouth/Throat: Oropharynx is clear and moist.  Eyes: EOM are normal. Pupils are equal, round, and reactive to light.  Neck: Normal range of motion. Neck supple. No JVD present. No thyromegaly present.  Cardiovascular: Normal rate and regular rhythm.   Murmur heard. 2/6  systolic murmur RSB  Respiratory: Effort normal and breath sounds normal. No respiratory distress.  GI: Soft. Bowel sounds are normal. She exhibits no distension and no mass. There is no tenderness.  Musculoskeletal: She exhibits no edema.  Lymphadenopathy:    She has no cervical adenopathy.  Neurological: She is alert and oriented to person, place, and time. She has normal strength. No cranial nerve deficit or sensory deficit.  Skin: Skin is warm and dry.     Assessment/Plan  There has been progressive enlargement of the distal ascending and arch aneurysm to 5.6 cm from 5.2 cm in June 2017. This will most likely continue to enlarge and rupture if not repaired. She is 82 years old with rheumatoid arthritis, stage III CKD,  fibromyalgia, IBS, DM and multiple other medial problems but continues to remain active. Surgical repair of this is the best option for her and I think she is still an operative candidate despite the risk. The options are debranching of the arch and stent grafting vs open arch replacement under circulatory arrest. Debranching and stent grafting would still require a redo sternotomy and grafting from the proximal aortic graft up to the innominate and left carotid with a carotid to left subclavian bypass or transposition. This is a difficult surgery under redo circumstances with a large arch aneurysm distorting the arch vessels and making exposure difficulty. Then she would require stent grafting which may be difficult due to the angulation of the aorta. Open repair may not be any more difficult but has the risk of circulatory arrest in an 82 year old pt. I think that open surgical repair with arch replacement is the best option.  I don't think another cardiac cath is needed since her cath in 2014 showed no coronary disease and she has no anginal symptoms. I discussed the operative procedure with the patient  including alternatives, benefits and high risks; including but not limited to  bleeding, blood transfusion, infection, stroke, myocardial infarction, heart block requiring a  permanent pacemaker, organ dysfunction, and death.  [name] understands and agrees to proceed.    Gaye Pollack, MD 06/28/2016, 6:44 PM

## 2016-06-29 ENCOUNTER — Encounter (HOSPITAL_COMMUNITY)
Admission: RE | Disposition: A | Discharge status: Home / Self Care | Payer: Self-pay | Source: Ambulatory Visit | Attending: Surgery

## 2016-06-29 ENCOUNTER — Inpatient Hospital Stay (HOSPITAL_COMMUNITY)
Admission: RE | Admit: 2016-06-29 | Discharge: 2016-07-04 | DRG: 220 | Disposition: A | Discharge status: Home / Self Care | Payer: Medicare HMO | Source: Ambulatory Visit | Attending: Surgery | Admitting: Surgery

## 2016-06-29 ENCOUNTER — Inpatient Hospital Stay (HOSPITAL_COMMUNITY): Payer: Medicare HMO | Admitting: Vascular Surgery

## 2016-06-29 ENCOUNTER — Inpatient Hospital Stay (HOSPITAL_COMMUNITY): Payer: Medicare HMO

## 2016-06-29 ENCOUNTER — Encounter (HOSPITAL_COMMUNITY): Payer: Self-pay | Admitting: Surgery

## 2016-06-29 DIAGNOSIS — I714 Abdominal aortic aneurysm, without rupture: Secondary | ICD-10-CM | POA: Diagnosis not present

## 2016-06-29 DIAGNOSIS — Z96612 Presence of left artificial shoulder joint: Secondary | ICD-10-CM | POA: Diagnosis present

## 2016-06-29 DIAGNOSIS — E1122 Type 2 diabetes mellitus with diabetic chronic kidney disease: Secondary | ICD-10-CM | POA: Diagnosis not present

## 2016-06-29 DIAGNOSIS — Z880 Allergy status to penicillin: Secondary | ICD-10-CM | POA: Diagnosis not present

## 2016-06-29 DIAGNOSIS — F329 Major depressive disorder, single episode, unspecified: Secondary | ICD-10-CM | POA: Diagnosis present

## 2016-06-29 DIAGNOSIS — Z9842 Cataract extraction status, left eye: Secondary | ICD-10-CM

## 2016-06-29 DIAGNOSIS — Z96653 Presence of artificial knee joint, bilateral: Secondary | ICD-10-CM | POA: Diagnosis present

## 2016-06-29 DIAGNOSIS — J9811 Atelectasis: Secondary | ICD-10-CM | POA: Diagnosis not present

## 2016-06-29 DIAGNOSIS — Z9841 Cataract extraction status, right eye: Secondary | ICD-10-CM | POA: Diagnosis not present

## 2016-06-29 DIAGNOSIS — M4626 Osteomyelitis of vertebra, lumbar region: Secondary | ICD-10-CM | POA: Diagnosis not present

## 2016-06-29 DIAGNOSIS — E1169 Type 2 diabetes mellitus with other specified complication: Secondary | ICD-10-CM | POA: Diagnosis not present

## 2016-06-29 DIAGNOSIS — I712 Thoracic aortic aneurysm, without rupture, unspecified: Secondary | ICD-10-CM

## 2016-06-29 DIAGNOSIS — G4700 Insomnia, unspecified: Secondary | ICD-10-CM | POA: Diagnosis present

## 2016-06-29 DIAGNOSIS — J449 Chronic obstructive pulmonary disease, unspecified: Secondary | ICD-10-CM | POA: Diagnosis not present

## 2016-06-29 DIAGNOSIS — F419 Anxiety disorder, unspecified: Secondary | ICD-10-CM | POA: Diagnosis present

## 2016-06-29 DIAGNOSIS — Z888 Allergy status to other drugs, medicaments and biological substances status: Secondary | ICD-10-CM | POA: Diagnosis not present

## 2016-06-29 DIAGNOSIS — Z886 Allergy status to analgesic agent status: Secondary | ICD-10-CM

## 2016-06-29 DIAGNOSIS — Z86718 Personal history of other venous thrombosis and embolism: Secondary | ICD-10-CM | POA: Diagnosis not present

## 2016-06-29 DIAGNOSIS — M069 Rheumatoid arthritis, unspecified: Secondary | ICD-10-CM | POA: Diagnosis present

## 2016-06-29 DIAGNOSIS — N183 Chronic kidney disease, stage 3 (moderate): Secondary | ICD-10-CM | POA: Diagnosis not present

## 2016-06-29 DIAGNOSIS — Z95828 Presence of other vascular implants and grafts: Secondary | ICD-10-CM

## 2016-06-29 DIAGNOSIS — I7121 Aneurysm of the ascending aorta, without rupture: Secondary | ICD-10-CM

## 2016-06-29 DIAGNOSIS — Z85828 Personal history of other malignant neoplasm of skin: Secondary | ICD-10-CM | POA: Diagnosis not present

## 2016-06-29 DIAGNOSIS — J90 Pleural effusion, not elsewhere classified: Secondary | ICD-10-CM | POA: Diagnosis not present

## 2016-06-29 DIAGNOSIS — Z86711 Personal history of pulmonary embolism: Secondary | ICD-10-CM

## 2016-06-29 DIAGNOSIS — Z79891 Long term (current) use of opiate analgesic: Secondary | ICD-10-CM

## 2016-06-29 DIAGNOSIS — K449 Diaphragmatic hernia without obstruction or gangrene: Secondary | ICD-10-CM | POA: Diagnosis not present

## 2016-06-29 DIAGNOSIS — M797 Fibromyalgia: Secondary | ICD-10-CM | POA: Diagnosis present

## 2016-06-29 DIAGNOSIS — K589 Irritable bowel syndrome without diarrhea: Secondary | ICD-10-CM | POA: Diagnosis present

## 2016-06-29 DIAGNOSIS — K219 Gastro-esophageal reflux disease without esophagitis: Secondary | ICD-10-CM | POA: Diagnosis present

## 2016-06-29 DIAGNOSIS — E119 Type 2 diabetes mellitus without complications: Secondary | ICD-10-CM | POA: Diagnosis not present

## 2016-06-29 DIAGNOSIS — B370 Candidal stomatitis: Secondary | ICD-10-CM | POA: Diagnosis not present

## 2016-06-29 DIAGNOSIS — G8929 Other chronic pain: Secondary | ICD-10-CM | POA: Diagnosis present

## 2016-06-29 DIAGNOSIS — R69 Illness, unspecified: Secondary | ICD-10-CM | POA: Diagnosis not present

## 2016-06-29 DIAGNOSIS — I7122 Aneurysm of the aortic arch, without rupture: Secondary | ICD-10-CM

## 2016-06-29 HISTORY — PX: TEE WITHOUT CARDIOVERSION: SHX5443

## 2016-06-29 HISTORY — PX: REPLACEMENT ASCENDING AORTA: SHX6068

## 2016-06-29 LAB — POCT I-STAT 3, ART BLOOD GAS (G3+)
Acid-Base Excess: 3 mmol/L — ABNORMAL HIGH (ref 0.0–2.0)
Acid-Base Excess: 4 mmol/L — ABNORMAL HIGH (ref 0.0–2.0)
Acid-base deficit: 1 mmol/L (ref 0.0–2.0)
Acid-base deficit: 3 mmol/L — ABNORMAL HIGH (ref 0.0–2.0)
Acid-base deficit: 3 mmol/L — ABNORMAL HIGH (ref 0.0–2.0)
BICARBONATE: 22.6 mmol/L (ref 20.0–28.0)
Bicarbonate: 26.8 mmol/L (ref 20.0–28.0)
Bicarbonate: 31 mmol/L — ABNORMAL HIGH (ref 20.0–28.0)
Bicarbonate: 24 mmol/L (ref 20.0–28.0)
Bicarbonate: 22.7 mmol/L (ref 20.0–28.0)
O2 SAT: 100 %
O2 SAT: 99 %
O2 Saturation: 100 %
O2 Saturation: 99 %
O2 Saturation: 97 %
PCO2 ART: 40.9 mmHg (ref 32.0–48.0)
PCO2 ART: 41.7 mmHg (ref 32.0–48.0)
PH ART: 7.473 — AB (ref 7.350–7.450)
PH ART: 7.26 — AB (ref 7.350–7.450)
PH ART: 7.414 (ref 7.350–7.450)
PH ART: 7.342 — AB (ref 7.350–7.450)
PO2 ART: 118 mmHg — AB (ref 83.0–108.0)
PO2 ART: 92 mmHg (ref 83.0–108.0)
Patient temperature: 36.4
Patient temperature: 36.6
TCO2: 28 mmol/L (ref 0–100)
TCO2: 33 mmol/L (ref 0–100)
TCO2: 25 mmol/L (ref 0–100)
TCO2: 24 mmol/L (ref 0–100)
TCO2: 24 mmol/L (ref 0–100)
pCO2 arterial: 36.6 mmHg (ref 32.0–48.0)
pCO2 arterial: 69 mmHg (ref 32.0–48.0)
pCO2 arterial: 36.8 mmHg (ref 32.0–48.0)
pH, Arterial: 7.348 — ABNORMAL LOW (ref 7.350–7.450)
pO2, Arterial: 365 mmHg — ABNORMAL HIGH (ref 83.0–108.0)
pO2, Arterial: 412 mmHg — ABNORMAL HIGH (ref 83.0–108.0)
pO2, Arterial: 146 mmHg — ABNORMAL HIGH (ref 83.0–108.0)

## 2016-06-29 LAB — POCT I-STAT, CHEM 8
BUN: 30 mg/dL — ABNORMAL HIGH (ref 6–20)
BUN: 28 mg/dL — ABNORMAL HIGH (ref 6–20)
BUN: 29 mg/dL — ABNORMAL HIGH (ref 6–20)
BUN: 31 mg/dL — ABNORMAL HIGH (ref 6–20)
BUN: 33 mg/dL — ABNORMAL HIGH (ref 6–20)
BUN: 30 mg/dL — ABNORMAL HIGH (ref 6–20)
BUN: 28 mg/dL — ABNORMAL HIGH (ref 6–20)
CALCIUM ION: 1.26 mmol/L (ref 1.15–1.40)
CALCIUM ION: 1.26 mmol/L (ref 1.15–1.40)
CALCIUM ION: 1.07 mmol/L — AB (ref 1.15–1.40)
CALCIUM ION: 1.03 mmol/L — AB (ref 1.15–1.40)
CHLORIDE: 104 mmol/L (ref 101–111)
CHLORIDE: 106 mmol/L (ref 101–111)
CREATININE: 1.2 mg/dL — AB (ref 0.44–1.00)
Calcium, Ion: 1.13 mmol/L — ABNORMAL LOW (ref 1.15–1.40)
Calcium, Ion: 1.02 mmol/L — ABNORMAL LOW (ref 1.15–1.40)
Calcium, Ion: 1.2 mmol/L (ref 1.15–1.40)
Chloride: 104 mmol/L (ref 101–111)
Chloride: 104 mmol/L (ref 101–111)
Chloride: 101 mmol/L (ref 101–111)
Chloride: 100 mmol/L — ABNORMAL LOW (ref 101–111)
Chloride: 101 mmol/L (ref 101–111)
Creatinine, Ser: 1.2 mg/dL — ABNORMAL HIGH (ref 0.44–1.00)
Creatinine, Ser: 1.1 mg/dL — ABNORMAL HIGH (ref 0.44–1.00)
Creatinine, Ser: 1.1 mg/dL — ABNORMAL HIGH (ref 0.44–1.00)
Creatinine, Ser: 1.1 mg/dL — ABNORMAL HIGH (ref 0.44–1.00)
Creatinine, Ser: 1 mg/dL (ref 0.44–1.00)
Creatinine, Ser: 1.1 mg/dL — ABNORMAL HIGH (ref 0.44–1.00)
GLUCOSE: 100 mg/dL — AB (ref 65–99)
GLUCOSE: 110 mg/dL — AB (ref 65–99)
GLUCOSE: 123 mg/dL — AB (ref 65–99)
GLUCOSE: 140 mg/dL — AB (ref 65–99)
Glucose, Bld: 149 mg/dL — ABNORMAL HIGH (ref 65–99)
Glucose, Bld: 181 mg/dL — ABNORMAL HIGH (ref 65–99)
Glucose, Bld: 164 mg/dL — ABNORMAL HIGH (ref 65–99)
HCT: 26 % — ABNORMAL LOW (ref 36.0–46.0)
HCT: 24 % — ABNORMAL LOW (ref 36.0–46.0)
HCT: 22 % — ABNORMAL LOW (ref 36.0–46.0)
HCT: 20 % — ABNORMAL LOW (ref 36.0–46.0)
HEMATOCRIT: 23 % — AB (ref 36.0–46.0)
HEMATOCRIT: 23 % — AB (ref 36.0–46.0)
HEMATOCRIT: 32 % — AB (ref 36.0–46.0)
HEMOGLOBIN: 8.8 g/dL — AB (ref 12.0–15.0)
HEMOGLOBIN: 7.5 g/dL — AB (ref 12.0–15.0)
HEMOGLOBIN: 7.8 g/dL — AB (ref 12.0–15.0)
HEMOGLOBIN: 10.9 g/dL — AB (ref 12.0–15.0)
Hemoglobin: 8.2 g/dL — ABNORMAL LOW (ref 12.0–15.0)
Hemoglobin: 6.8 g/dL — CL (ref 12.0–15.0)
Hemoglobin: 7.8 g/dL — ABNORMAL LOW (ref 12.0–15.0)
POTASSIUM: 3.8 mmol/L (ref 3.5–5.1)
POTASSIUM: 3.7 mmol/L (ref 3.5–5.1)
POTASSIUM: 4.1 mmol/L (ref 3.5–5.1)
POTASSIUM: 4.4 mmol/L (ref 3.5–5.1)
POTASSIUM: 4.8 mmol/L (ref 3.5–5.1)
Potassium: 3.5 mmol/L (ref 3.5–5.1)
Potassium: 4.1 mmol/L (ref 3.5–5.1)
SODIUM: 133 mmol/L — AB (ref 135–145)
SODIUM: 138 mmol/L (ref 135–145)
SODIUM: 138 mmol/L (ref 135–145)
Sodium: 139 mmol/L (ref 135–145)
Sodium: 139 mmol/L (ref 135–145)
Sodium: 139 mmol/L (ref 135–145)
Sodium: 139 mmol/L (ref 135–145)
TCO2: 27 mmol/L (ref 0–100)
TCO2: 27 mmol/L (ref 0–100)
TCO2: 28 mmol/L (ref 0–100)
TCO2: 25 mmol/L (ref 0–100)
TCO2: 29 mmol/L (ref 0–100)
TCO2: 24 mmol/L (ref 0–100)
TCO2: 23 mmol/L (ref 0–100)

## 2016-06-29 LAB — GLUCOSE, CAPILLARY
GLUCOSE-CAPILLARY: 102 mg/dL — AB (ref 65–99)
GLUCOSE-CAPILLARY: 103 mg/dL — AB (ref 65–99)
GLUCOSE-CAPILLARY: 126 mg/dL — AB (ref 65–99)
GLUCOSE-CAPILLARY: 128 mg/dL — AB (ref 65–99)
Glucose-Capillary: 72 mg/dL (ref 65–99)
Glucose-Capillary: 99 mg/dL (ref 65–99)
Glucose-Capillary: 103 mg/dL — ABNORMAL HIGH (ref 65–99)
Glucose-Capillary: 123 mg/dL — ABNORMAL HIGH (ref 65–99)
Glucose-Capillary: 127 mg/dL — ABNORMAL HIGH (ref 65–99)

## 2016-06-29 LAB — CBC
HCT: 32.3 % — ABNORMAL LOW (ref 36.0–46.0)
HEMATOCRIT: 30.9 % — AB (ref 36.0–46.0)
HEMOGLOBIN: 10.7 g/dL — AB (ref 12.0–15.0)
Hemoglobin: 10.4 g/dL — ABNORMAL LOW (ref 12.0–15.0)
MCH: 29.3 pg (ref 26.0–34.0)
MCH: 28.8 pg (ref 26.0–34.0)
MCHC: 33.7 g/dL (ref 30.0–36.0)
MCHC: 33.1 g/dL (ref 30.0–36.0)
MCV: 87 fL (ref 78.0–100.0)
MCV: 87.1 fL (ref 78.0–100.0)
PLATELETS: 152 10*3/uL (ref 150–400)
PLATELETS: 164 10*3/uL (ref 150–400)
RBC: 3.55 MIL/uL — ABNORMAL LOW (ref 3.87–5.11)
RBC: 3.71 MIL/uL — AB (ref 3.87–5.11)
RDW: 13.3 % (ref 11.5–15.5)
RDW: 13.6 % (ref 11.5–15.5)
WBC: 4.4 10*3/uL (ref 4.0–10.5)
WBC: 4.9 10*3/uL (ref 4.0–10.5)

## 2016-06-29 LAB — PREPARE RBC (CROSSMATCH)

## 2016-06-29 LAB — CREATININE, SERUM
Creatinine, Ser: 1.21 mg/dL — ABNORMAL HIGH (ref 0.44–1.00)
GFR, EST AFRICAN AMERICAN: 48 mL/min — AB (ref >60)
GFR, EST NON AFRICAN AMERICAN: 41 mL/min — AB (ref >60)

## 2016-06-29 LAB — POCT I-STAT 4, (NA,K, GLUC, HGB,HCT)
GLUCOSE: 81 mg/dL (ref 65–99)
HCT: 30 % — ABNORMAL LOW (ref 36.0–46.0)
Hemoglobin: 10.2 g/dL — ABNORMAL LOW (ref 12.0–15.0)
Potassium: 3.7 mmol/L (ref 3.5–5.1)
Sodium: 140 mmol/L (ref 135–145)

## 2016-06-29 LAB — PROTIME-INR
INR: 1.48
Prothrombin Time: 18.1 seconds — ABNORMAL HIGH (ref 11.4–15.2)

## 2016-06-29 LAB — HEMOGLOBIN AND HEMATOCRIT, BLOOD
HCT: 21.1 % — ABNORMAL LOW (ref 36.0–46.0)
Hemoglobin: 7.1 g/dL — ABNORMAL LOW (ref 12.0–15.0)

## 2016-06-29 LAB — FIBRINOGEN: Fibrinogen: 232 mg/dL (ref 210–475)

## 2016-06-29 LAB — MAGNESIUM: MAGNESIUM: 3.3 mg/dL — AB (ref 1.7–2.4)

## 2016-06-29 LAB — APTT: aPTT: 36 seconds (ref 24–36)

## 2016-06-29 LAB — PLATELET COUNT: PLATELETS: 73 10*3/uL — AB (ref 150–400)

## 2016-06-29 SURGERY — REDO STERNOTOMY
Anesthesia: General

## 2016-06-29 MED ORDER — SODIUM CHLORIDE 0.9 % IV SOLN
INTRAVENOUS | Status: DC
  Administered 2016-06-29: 0.8 [IU]/h via INTRAVENOUS
  Filled 2016-06-29: qty 2.5

## 2016-06-29 MED ORDER — MIDAZOLAM HCL 5 MG/ML IJ SOLN
INTRAMUSCULAR | Status: DC | PRN
  Administered 2016-06-29 (×4): 1 mg via INTRAVENOUS

## 2016-06-29 MED ORDER — CHLORHEXIDINE GLUCONATE CLOTH 2 % EX PADS
6.0000 | MEDICATED_PAD | Freq: Every day | CUTANEOUS | Status: AC
  Administered 2016-06-30 – 2016-07-04 (×5): 6 via TOPICAL

## 2016-06-29 MED ORDER — MORPHINE SULFATE (PF) 2 MG/ML IV SOLN: 2.0000 mg | INTRAVENOUS | Status: DC | PRN

## 2016-06-29 MED ORDER — SUCCINYLCHOLINE CHLORIDE 200 MG/10ML IV SOSY
PREFILLED_SYRINGE | INTRAVENOUS | Status: AC
  Filled 2016-06-29: qty 10

## 2016-06-29 MED ORDER — LIDOCAINE 2% (20 MG/ML) 5 ML SYRINGE
INTRAMUSCULAR | Status: DC | PRN
  Administered 2016-06-29: 80 mg via INTRAVENOUS

## 2016-06-29 MED ORDER — MUPIROCIN 2 % EX OINT
1.0000 | TOPICAL_OINTMENT | Freq: Two times a day (BID) | CUTANEOUS | Status: DC
Start: 2016-06-29 — End: 2016-07-04
  Administered 2016-06-30 – 2016-07-04 (×9): 1 via NASAL
  Filled 2016-06-29 (×2): qty 22

## 2016-06-29 MED ORDER — FAMOTIDINE IN NACL 20-0.9 MG/50ML-% IV SOLN
20.0000 mg | Freq: Two times a day (BID) | INTRAVENOUS | Status: AC
  Administered 2016-06-29: 20 mg via INTRAVENOUS

## 2016-06-29 MED ORDER — MORPHINE SULFATE (PF) 2 MG/ML IV SOLN: 1.0000 mg | INTRAVENOUS | Status: DC | PRN

## 2016-06-29 MED ORDER — VANCOMYCIN HCL IN DEXTROSE 1-5 GM/200ML-% IV SOLN
1000.0000 mg | Freq: Once | INTRAVENOUS | Status: AC
  Administered 2016-06-29: 1000 mg via INTRAVENOUS
  Filled 2016-06-29: qty 200

## 2016-06-29 MED ORDER — METHYLPREDNISOLONE SODIUM SUCC 125 MG IJ SOLR
125.0000 mg | INTRAMUSCULAR | Status: AC
  Administered 2016-06-29: 125 mg via INTRAVENOUS
  Filled 2016-06-29: qty 2

## 2016-06-29 MED ORDER — METOPROLOL TARTRATE 5 MG/5ML IV SOLN: 2.5000 mg | INTRAVENOUS | Status: DC | PRN

## 2016-06-29 MED ORDER — THROMBIN 20000 UNITS EX SOLR
OROMUCOSAL | Status: DC | PRN
  Administered 2016-06-29 (×3): 4 mL via TOPICAL

## 2016-06-29 MED ORDER — BISACODYL 10 MG RE SUPP: 10.0000 mg | Freq: Every day | RECTAL | Status: DC

## 2016-06-29 MED ORDER — CHLORHEXIDINE GLUCONATE 0.12% ORAL RINSE (MEDLINE KIT)
15.0000 mL | Freq: Two times a day (BID) | OROMUCOSAL | Status: DC
  Administered 2016-06-29: 15 mL via OROMUCOSAL

## 2016-06-29 MED ORDER — THROMBIN 20000 UNITS EX SOLR
CUTANEOUS | Status: AC
  Filled 2016-06-29: qty 20000

## 2016-06-29 MED ORDER — BISACODYL 5 MG PO TBEC
10.0000 mg | DELAYED_RELEASE_TABLET | Freq: Every day | ORAL | Status: DC
  Administered 2016-06-30 – 2016-07-01 (×2): 10 mg via ORAL
  Filled 2016-06-29 (×2): qty 2

## 2016-06-29 MED ORDER — ROCURONIUM BROMIDE 50 MG/5ML IV SOSY
PREFILLED_SYRINGE | INTRAVENOUS | Status: AC
  Filled 2016-06-29: qty 5

## 2016-06-29 MED ORDER — ACETAMINOPHEN 160 MG/5ML PO SOLN: 1000.0000 mg | Freq: Four times a day (QID) | ORAL | Status: DC

## 2016-06-29 MED ORDER — ASPIRIN EC 325 MG PO TBEC
325.0000 mg | DELAYED_RELEASE_TABLET | Freq: Every day | ORAL | Status: DC
  Administered 2016-06-30 – 2016-07-02 (×3): 325 mg via ORAL
  Filled 2016-06-29 (×3): qty 1

## 2016-06-29 MED ORDER — ROCURONIUM BROMIDE 10 MG/ML (PF) SYRINGE
PREFILLED_SYRINGE | INTRAVENOUS | Status: DC | PRN
  Administered 2016-06-29: 50 mg via INTRAVENOUS
  Administered 2016-06-29: 20 mg via INTRAVENOUS
  Administered 2016-06-29: 50 mg via INTRAVENOUS

## 2016-06-29 MED ORDER — DEXMEDETOMIDINE HCL IN NACL 200 MCG/50ML IV SOLN
0.0000 ug/kg/h | INTRAVENOUS | Status: DC
  Administered 2016-06-29: 0.5 ug/kg/h via INTRAVENOUS

## 2016-06-29 MED ORDER — ORAL CARE MOUTH RINSE
15.0000 mL | Freq: Two times a day (BID) | OROMUCOSAL | Status: DC
  Administered 2016-06-30 – 2016-07-04 (×7): 15 mL via OROMUCOSAL

## 2016-06-29 MED ORDER — ORAL CARE MOUTH RINSE
15.0000 mL | OROMUCOSAL | Status: DC
  Administered 2016-06-29: 15 mL via OROMUCOSAL

## 2016-06-29 MED ORDER — 0.9 % SODIUM CHLORIDE (POUR BTL) OPTIME
TOPICAL | Status: DC | PRN
  Administered 2016-06-29: 6000 mL

## 2016-06-29 MED ORDER — ALBUMIN HUMAN 5 % IV SOLN
INTRAVENOUS | Status: DC | PRN
  Administered 2016-06-29: 14:00:00 via INTRAVENOUS

## 2016-06-29 MED ORDER — LACTATED RINGERS IV SOLN
INTRAVENOUS | Status: DC
  Administered 2016-06-29: 16:00:00 via INTRAVENOUS

## 2016-06-29 MED ORDER — PHENYLEPHRINE 40 MCG/ML (10ML) SYRINGE FOR IV PUSH (FOR BLOOD PRESSURE SUPPORT)
PREFILLED_SYRINGE | INTRAVENOUS | Status: AC
  Filled 2016-06-29: qty 10

## 2016-06-29 MED ORDER — LACTATED RINGERS IV SOLN
INTRAVENOUS | Status: DC | PRN
  Administered 2016-06-29 (×2): via INTRAVENOUS

## 2016-06-29 MED ORDER — ASPIRIN 81 MG PO CHEW: 324.0000 mg | CHEWABLE_TABLET | Freq: Every day | ORAL | Status: DC

## 2016-06-29 MED ORDER — PHENYLEPHRINE HCL 10 MG/ML IJ SOLN
INTRAMUSCULAR | Status: DC | PRN
  Administered 2016-06-29 (×2): 40 ug via INTRAVENOUS

## 2016-06-29 MED ORDER — PROPOFOL 10 MG/ML IV BOLUS
INTRAVENOUS | Status: DC | PRN
  Administered 2016-06-29: 30 mg via INTRAVENOUS
  Administered 2016-06-29: 80 mg via INTRAVENOUS
  Administered 2016-06-29 (×2): 20 mg via INTRAVENOUS

## 2016-06-29 MED ORDER — ACETAMINOPHEN 160 MG/5ML PO SOLN: 650.0000 mg | Freq: Once | ORAL | Status: DC

## 2016-06-29 MED ORDER — CHLORHEXIDINE GLUCONATE 4 % EX LIQD: 30.0000 mL | CUTANEOUS | Status: DC

## 2016-06-29 MED ORDER — FENTANYL CITRATE (PF) 250 MCG/5ML IJ SOLN
INTRAMUSCULAR | Status: AC
  Filled 2016-06-29: qty 5

## 2016-06-29 MED ORDER — HEPARIN SODIUM (PORCINE) 1000 UNIT/ML IJ SOLN
INTRAMUSCULAR | Status: DC | PRN
  Administered 2016-06-29: 10000 [IU] via INTRAVENOUS
  Administered 2016-06-29: 18000 [IU] via INTRAVENOUS

## 2016-06-29 MED ORDER — MAGNESIUM SULFATE 4 GM/100ML IV SOLN
4.0000 g | Freq: Once | INTRAVENOUS | Status: AC
  Administered 2016-06-29: 4 g via INTRAVENOUS
  Filled 2016-06-29: qty 100

## 2016-06-29 MED ORDER — EPHEDRINE 5 MG/ML INJ
INTRAVENOUS | Status: AC
  Filled 2016-06-29: qty 10

## 2016-06-29 MED ORDER — SODIUM CHLORIDE 0.9 % IV SOLN: 250.0000 mL | INTRAVENOUS | Status: DC

## 2016-06-29 MED ORDER — LEVOFLOXACIN IN D5W 750 MG/150ML IV SOLN
750.0000 mg | INTRAVENOUS | Status: AC
  Administered 2016-06-30: 750 mg via INTRAVENOUS
  Filled 2016-06-29: qty 150

## 2016-06-29 MED ORDER — MIDAZOLAM HCL 2 MG/2ML IJ SOLN: INTRAMUSCULAR | Status: DC | PRN

## 2016-06-29 MED ORDER — LACTATED RINGERS IV SOLN
500.0000 mL | Freq: Once | INTRAVENOUS | Status: DC | PRN
Start: 2016-06-29 — End: 2016-07-02

## 2016-06-29 MED ORDER — MIDAZOLAM HCL 2 MG/2ML IJ SOLN: 2.0000 mg | INTRAMUSCULAR | Status: DC | PRN

## 2016-06-29 MED ORDER — METOPROLOL TARTRATE 25 MG/10 ML ORAL SUSPENSION: 12.5000 mg | Freq: Two times a day (BID) | ORAL | Status: DC

## 2016-06-29 MED ORDER — SODIUM CHLORIDE 0.9 % IV SOLN
30.0000 meq | Freq: Once | INTRAVENOUS | Status: AC
  Administered 2016-06-29: 30 meq via INTRAVENOUS
  Filled 2016-06-29: qty 15

## 2016-06-29 MED ORDER — PANTOPRAZOLE SODIUM 40 MG PO TBEC
40.0000 mg | DELAYED_RELEASE_TABLET | Freq: Every day | ORAL | Status: DC
  Administered 2016-07-01 – 2016-07-02 (×2): 40 mg via ORAL
  Filled 2016-06-29 (×2): qty 1

## 2016-06-29 MED ORDER — ONDANSETRON HCL 4 MG/2ML IJ SOLN: 4.0000 mg | Freq: Four times a day (QID) | INTRAMUSCULAR | Status: DC | PRN

## 2016-06-29 MED ORDER — SODIUM CHLORIDE 0.9% FLUSH
3.0000 mL | INTRAVENOUS | Status: DC | PRN
  Administered 2016-07-01: 3 mL via INTRAVENOUS
  Filled 2016-06-29: qty 3

## 2016-06-29 MED ORDER — SODIUM CHLORIDE 0.9 % IJ SOLN
INTRAMUSCULAR | Status: AC
  Filled 2016-06-29: qty 10

## 2016-06-29 MED ORDER — PROTAMINE SULFATE 10 MG/ML IV SOLN
INTRAVENOUS | Status: DC | PRN
  Administered 2016-06-29 (×4): 50 mg via INTRAVENOUS

## 2016-06-29 MED ORDER — NITROGLYCERIN IN D5W 200-5 MCG/ML-% IV SOLN
0.0000 ug/min | INTRAVENOUS | Status: DC
  Administered 2016-06-29: 30 ug/min via INTRAVENOUS

## 2016-06-29 MED ORDER — SODIUM CHLORIDE 0.9% FLUSH
3.0000 mL | Freq: Two times a day (BID) | INTRAVENOUS | Status: DC
  Administered 2016-06-30 – 2016-07-01 (×3): 3 mL via INTRAVENOUS

## 2016-06-29 MED ORDER — FENTANYL CITRATE (PF) 100 MCG/2ML IJ SOLN
50.0000 ug | INTRAMUSCULAR | Status: DC | PRN
  Administered 2016-06-29 – 2016-06-30 (×10): 50 ug via INTRAVENOUS
  Filled 2016-06-29 (×11): qty 2

## 2016-06-29 MED ORDER — OXYCODONE HCL 5 MG PO TABS
5.0000 mg | ORAL_TABLET | ORAL | Status: DC | PRN
  Administered 2016-06-30 (×2): 10 mg via ORAL
  Filled 2016-06-29 (×2): qty 2

## 2016-06-29 MED ORDER — SODIUM CHLORIDE 0.45 % IV SOLN
INTRAVENOUS | Status: DC | PRN
  Administered 2016-06-29: 16:00:00 via INTRAVENOUS

## 2016-06-29 MED ORDER — TRAMADOL HCL 50 MG PO TABS: 50.0000 mg | ORAL_TABLET | Freq: Four times a day (QID) | ORAL | Status: DC | PRN

## 2016-06-29 MED ORDER — ACETAMINOPHEN 650 MG RE SUPP: 650.0000 mg | Freq: Once | RECTAL | Status: DC

## 2016-06-29 MED ORDER — PROPOFOL 10 MG/ML IV BOLUS
INTRAVENOUS | Status: AC
  Filled 2016-06-29: qty 20

## 2016-06-29 MED ORDER — LIDOCAINE 2% (20 MG/ML) 5 ML SYRINGE
INTRAMUSCULAR | Status: AC
  Filled 2016-06-29: qty 5

## 2016-06-29 MED ORDER — SODIUM CHLORIDE 0.9 % IV SOLN
INTRAVENOUS | Status: DC
  Administered 2016-06-29: 16:00:00 via INTRAVENOUS

## 2016-06-29 MED ORDER — FENTANYL CITRATE (PF) 250 MCG/5ML IJ SOLN
INTRAMUSCULAR | Status: AC
  Filled 2016-06-29: qty 20

## 2016-06-29 MED ORDER — THROMBIN 20000 UNITS EX SOLR
CUTANEOUS | Status: DC | PRN
  Administered 2016-06-29: 20000 [IU] via TOPICAL

## 2016-06-29 MED ORDER — SODIUM CHLORIDE 0.9 % IV SOLN
0.0000 ug/min | INTRAVENOUS | Status: DC
  Filled 2016-06-29: qty 2

## 2016-06-29 MED ORDER — PHENYLEPHRINE 40 MCG/ML (10ML) SYRINGE FOR IV PUSH (FOR BLOOD PRESSURE SUPPORT)
PREFILLED_SYRINGE | INTRAVENOUS | Status: DC | PRN
  Administered 2016-06-29: 80 ug via INTRAVENOUS

## 2016-06-29 MED ORDER — HEMOSTATIC AGENTS (NO CHARGE) OPTIME
TOPICAL | Status: DC | PRN
  Administered 2016-06-29: 1 via TOPICAL

## 2016-06-29 MED ORDER — FENTANYL CITRATE (PF) 100 MCG/2ML IJ SOLN
INTRAMUSCULAR | Status: DC | PRN
  Administered 2016-06-29 (×2): 100 ug via INTRAVENOUS
  Administered 2016-06-29: 50 ug via INTRAVENOUS
  Administered 2016-06-29 (×2): 100 ug via INTRAVENOUS
  Administered 2016-06-29 (×3): 50 ug via INTRAVENOUS
  Administered 2016-06-29 (×2): 100 ug via INTRAVENOUS
  Administered 2016-06-29: 200 ug via INTRAVENOUS
  Administered 2016-06-29: 100 ug via INTRAVENOUS
  Administered 2016-06-29: 50 ug via INTRAVENOUS

## 2016-06-29 MED ORDER — METOPROLOL TARTRATE 12.5 MG HALF TABLET
12.5000 mg | ORAL_TABLET | Freq: Two times a day (BID) | ORAL | Status: DC
  Administered 2016-06-30 – 2016-07-01 (×3): 12.5 mg via ORAL
  Filled 2016-06-29 (×3): qty 1

## 2016-06-29 MED ORDER — HEMOSTATIC AGENTS (NO CHARGE) OPTIME
TOPICAL | Status: DC | PRN
  Administered 2016-06-29 (×2): 1 via TOPICAL

## 2016-06-29 MED ORDER — CHLORHEXIDINE GLUCONATE 0.12 % MT SOLN
15.0000 mL | OROMUCOSAL | Status: AC
  Administered 2016-06-29: 15 mL via OROMUCOSAL

## 2016-06-29 MED ORDER — INSULIN REGULAR BOLUS VIA INFUSION
0.0000 [IU] | Freq: Three times a day (TID) | INTRAVENOUS | Status: DC
  Filled 2016-06-29: qty 10

## 2016-06-29 MED ORDER — DOCUSATE SODIUM 100 MG PO CAPS
200.0000 mg | ORAL_CAPSULE | Freq: Every day | ORAL | Status: DC
  Administered 2016-06-30 – 2016-07-01 (×2): 200 mg via ORAL
  Filled 2016-06-29 (×2): qty 2

## 2016-06-29 MED ORDER — ALBUMIN HUMAN 5 % IV SOLN
250.0000 mL | INTRAVENOUS | Status: AC | PRN
  Administered 2016-06-29 (×2): 250 mL via INTRAVENOUS
  Filled 2016-06-29: qty 250

## 2016-06-29 MED ORDER — SODIUM CHLORIDE 0.9 % IV SOLN
INTRAVENOUS | Status: DC | PRN
  Administered 2016-06-29: 14:00:00 via INTRAVENOUS

## 2016-06-29 MED ORDER — MIDAZOLAM HCL 10 MG/2ML IJ SOLN
INTRAMUSCULAR | Status: AC
  Filled 2016-06-29: qty 2

## 2016-06-29 MED ORDER — ACETAMINOPHEN 500 MG PO TABS: 1000.0000 mg | ORAL_TABLET | Freq: Four times a day (QID) | ORAL | Status: DC

## 2016-06-29 MED FILL — Heparin Sodium (Porcine) Inj 1000 Unit/ML: INTRAMUSCULAR | Qty: 30 | Status: AC

## 2016-06-29 MED FILL — Potassium Chloride Inj 2 mEq/ML: INTRAVENOUS | Qty: 40 | Status: AC

## 2016-06-29 MED FILL — Magnesium Sulfate Inj 50%: INTRAMUSCULAR | Qty: 10 | Status: AC

## 2016-06-29 SURGICAL SUPPLY — 129 items
ADAPTER CARDIO PERF ANTE/RETRO (ADAPTER) ×3 IMPLANT
ADPR PRFSN 84XANTGRD RTRGD (ADAPTER) ×2
APL SRG 7X2 LUM MLBL SLNT (VASCULAR PRODUCTS) ×2
APPLICATOR TIP COSEAL (VASCULAR PRODUCTS) ×2 IMPLANT
ATTRACTOMAT 16X20 MAGNETIC DRP (DRAPES) ×2 IMPLANT
BAG DECANTER FOR FLEXI CONT (MISCELLANEOUS) ×1 IMPLANT
BLADE CORE FAN STRYKER (BLADE) ×2 IMPLANT
BLADE STERNUM SYSTEM 6 (BLADE) ×2 IMPLANT
BLADE SURG 15 STRL LF DISP TIS (BLADE) ×1 IMPLANT
BLADE SURG 15 STRL SS (BLADE) ×4
CANISTER SUCT 3000ML PPV (MISCELLANEOUS) ×2 IMPLANT
CANNULA AORTIC ROOT 9FR (CANNULA) ×1 IMPLANT
CANNULA GUNDRY RCSP 15FR (MISCELLANEOUS) ×2 IMPLANT
CANNULA MC2 2 STG 36/46 NON-V (CANNULA) IMPLANT
CANNULA SUMP PERICARDIAL (CANNULA) ×1 IMPLANT
CANNULA VENOUS 2 STG 34/46 (CANNULA) ×1
CATH HEART VENT LEFT (CATHETERS) IMPLANT
CATH ROBINSON RED A/P 18FR (CATHETERS) ×6 IMPLANT
CATH THORACIC 28FR (CATHETERS) ×1 IMPLANT
CATH THORACIC 36FR (CATHETERS) ×2 IMPLANT
CATH THORACIC 36FR RT ANG (CATHETERS) ×2 IMPLANT
CAUTERY HIGH TEMP VAS (MISCELLANEOUS) ×2 IMPLANT
CAUTERY SURG HI TEMP FINE TIP (MISCELLANEOUS) ×1 IMPLANT
CONN ST 1/4X3/8  BEN (MISCELLANEOUS) ×1
CONN ST 1/4X3/8 BEN (MISCELLANEOUS) IMPLANT
CONT SPEC 4OZ CLIKSEAL STRL BL (MISCELLANEOUS) ×1 IMPLANT
CONT SPEC STER OR (MISCELLANEOUS) ×2 IMPLANT
COVER MAYO STAND STRL (DRAPES) ×2 IMPLANT
COVER SURGICAL LIGHT HANDLE (MISCELLANEOUS) ×2 IMPLANT
CRADLE DONUT ADULT HEAD (MISCELLANEOUS) ×2 IMPLANT
DRAPE SLUSH/WARMER DISC (DRAPES) ×1 IMPLANT
DRSG COVADERM 4X14 (GAUZE/BANDAGES/DRESSINGS) ×2 IMPLANT
DRSG COVADERM 4X6 (GAUZE/BANDAGES/DRESSINGS) ×1 IMPLANT
DRSG COVADERM 4X8 (GAUZE/BANDAGES/DRESSINGS) ×1 IMPLANT
ELECT CAUTERY BLADE 6.4 (BLADE) ×2 IMPLANT
ELECT REM PT RETURN 9FT ADLT (ELECTROSURGICAL) ×4
ELECTRODE REM PT RTRN 9FT ADLT (ELECTROSURGICAL) ×2 IMPLANT
FELT TEFLON 1X6 (MISCELLANEOUS) ×3 IMPLANT
GAUZE SPONGE 4X4 12PLY STRL (GAUZE/BANDAGES/DRESSINGS) ×2 IMPLANT
GLOVE BIO SURGEON STRL SZ 6 (GLOVE) IMPLANT
GLOVE BIO SURGEON STRL SZ 6.5 (GLOVE) IMPLANT
GLOVE BIO SURGEON STRL SZ7 (GLOVE) IMPLANT
GLOVE BIO SURGEON STRL SZ7.5 (GLOVE) IMPLANT
GLOVE BIOGEL PI IND STRL 6 (GLOVE) IMPLANT
GLOVE BIOGEL PI IND STRL 6.5 (GLOVE) IMPLANT
GLOVE BIOGEL PI IND STRL 7.0 (GLOVE) IMPLANT
GLOVE BIOGEL PI IND STRL 7.5 (GLOVE) IMPLANT
GLOVE BIOGEL PI INDICATOR 6 (GLOVE) ×1
GLOVE BIOGEL PI INDICATOR 6.5 (GLOVE) ×5
GLOVE BIOGEL PI INDICATOR 7.0 (GLOVE) ×5
GLOVE BIOGEL PI INDICATOR 7.5 (GLOVE) ×1
GLOVE EUDERMIC 7 POWDERFREE (GLOVE) ×4 IMPLANT
GLOVE SURG SS PI 6.0 STRL IVOR (GLOVE) ×2 IMPLANT
GLOVE SURG SS PI 7.5 STRL IVOR (GLOVE) ×1 IMPLANT
GOWN STRL REUS W/ TWL LRG LVL3 (GOWN DISPOSABLE) ×4 IMPLANT
GOWN STRL REUS W/ TWL XL LVL3 (GOWN DISPOSABLE) ×1 IMPLANT
GOWN STRL REUS W/TWL LRG LVL3 (GOWN DISPOSABLE) ×16
GOWN STRL REUS W/TWL XL LVL3 (GOWN DISPOSABLE) ×2
GRAFT HEMASHIELD 8MM (Vascular Products) ×2 IMPLANT
GRAFT VASC AORT 28MM 40CM QUAD (Prosthesis & Implant Heart) ×1 IMPLANT
GRAFT VASC STRG 30X8KNIT (Vascular Products) IMPLANT
HEART VENT LT CURVED (MISCELLANEOUS) ×2 IMPLANT
HEMOSTAT POWDER SURGIFOAM 1G (HEMOSTASIS) ×6 IMPLANT
HEMOSTAT SURGICEL 2X14 (HEMOSTASIS) ×2 IMPLANT
INSERT FOGARTY SM (MISCELLANEOUS) ×2 IMPLANT
INSERT FOGARTY XLG (MISCELLANEOUS) ×1 IMPLANT
KIT BASIN OR (CUSTOM PROCEDURE TRAY) ×2 IMPLANT
KIT CATH CPB BARTLE (MISCELLANEOUS) ×1 IMPLANT
KIT ROOM TURNOVER OR (KITS) ×2 IMPLANT
KIT SUCTION CATH 14FR (SUCTIONS) ×2 IMPLANT
LINE VENT (MISCELLANEOUS) ×1 IMPLANT
LOOP VESSEL MAXI BLUE (MISCELLANEOUS) ×1 IMPLANT
LOOP VESSEL MINI RED (MISCELLANEOUS) ×1 IMPLANT
LOOP VESSEL SUPERMAXI WHITE (MISCELLANEOUS) ×1 IMPLANT
NS IRRIG 1000ML POUR BTL (IV SOLUTION) ×11 IMPLANT
PACK OPEN HEART (CUSTOM PROCEDURE TRAY) ×2 IMPLANT
PAD ARMBOARD 7.5X6 YLW CONV (MISCELLANEOUS) ×4 IMPLANT
SEALANT SURG COSEAL 8ML (VASCULAR PRODUCTS) ×2 IMPLANT
SET CARDIOPLEGIA MPS 5001102 (MISCELLANEOUS) ×1 IMPLANT
SPONGE LAP 18X18 X RAY DECT (DISPOSABLE) ×1 IMPLANT
SUT BONE WAX W31G (SUTURE) ×2 IMPLANT
SUT ETHIBON 2 0 V 52N 30 (SUTURE) ×4 IMPLANT
SUT ETHIBON EXCEL 2-0 V-5 (SUTURE) IMPLANT
SUT ETHIBOND 2 0 SH (SUTURE) ×4
SUT ETHIBOND 2 0 SH 36X2 (SUTURE) IMPLANT
SUT ETHIBOND V-5 VALVE (SUTURE) IMPLANT
SUT PROLENE 3 0 SH 1 (SUTURE) ×2 IMPLANT
SUT PROLENE 3 0 SH 48 (SUTURE) ×4 IMPLANT
SUT PROLENE 3 0 SH DA (SUTURE) ×3 IMPLANT
SUT PROLENE 4 0 RB 1 (SUTURE) ×12
SUT PROLENE 4-0 RB1 .5 CRCL 36 (SUTURE) ×4 IMPLANT
SUT PROLENE 5 0 C 1 36 (SUTURE) ×5 IMPLANT
SUT PROLENE 5 0 RB 2 (SUTURE) ×4 IMPLANT
SUT PROLENE 6 0 C 1 30 (SUTURE) ×2 IMPLANT
SUT PROLENE 8 0 BV175 6 (SUTURE) ×2 IMPLANT
SUT SILK  1 MH (SUTURE) ×1
SUT SILK 1 MH (SUTURE) IMPLANT
SUT SILK 1 TIES 10X30 (SUTURE) ×2 IMPLANT
SUT SILK 2 0 SH CR/8 (SUTURE) ×2 IMPLANT
SUT SILK 2 0 TIES 10X30 (SUTURE) ×1 IMPLANT
SUT SILK 2 0 TIES 17X18 (SUTURE) ×2
SUT SILK 2-0 18XBRD TIE BLK (SUTURE) IMPLANT
SUT SILK 3 0 SH CR/8 (SUTURE) ×1 IMPLANT
SUT SILK 4 0 TIE 10X30 (SUTURE) ×2 IMPLANT
SUT STEEL 6MS V (SUTURE) ×2 IMPLANT
SUT STEEL STERNAL CCS#1 18IN (SUTURE) IMPLANT
SUT STEEL SZ 6 DBL 3X14 BALL (SUTURE) IMPLANT
SUT TEM PAC WIRE 2 0 SH (SUTURE) ×4 IMPLANT
SUT VIC AB 1 CTX 36 (SUTURE) ×6
SUT VIC AB 1 CTX36XBRD ANBCTR (SUTURE) ×2 IMPLANT
SUT VIC AB 2-0 CT1 27 (SUTURE) ×4
SUT VIC AB 2-0 CT1 TAPERPNT 27 (SUTURE) IMPLANT
SUT VIC AB 2-0 CTX 27 (SUTURE) ×2 IMPLANT
SUT VIC AB 3-0 X1 27 (SUTURE) ×3 IMPLANT
SUTURE E-PAK OPEN HEART (SUTURE) ×1 IMPLANT
SYSTEM SAHARA CHEST DRAIN ATS (WOUND CARE) ×2 IMPLANT
TAPE CLOTH SURG 4X10 WHT LF (GAUZE/BANDAGES/DRESSINGS) ×1 IMPLANT
TAPE PAPER 2X10 WHT MICROPORE (GAUZE/BANDAGES/DRESSINGS) ×1 IMPLANT
TOWEL GREEN STERILE (TOWEL DISPOSABLE) ×8 IMPLANT
TOWEL GREEN STERILE FF (TOWEL DISPOSABLE) ×4 IMPLANT
TOWEL OR 17X24 6PK STRL BLUE (TOWEL DISPOSABLE) ×2 IMPLANT
TOWEL OR 17X26 10 PK STRL BLUE (TOWEL DISPOSABLE) ×2 IMPLANT
TRAY FOLEY CATH SILVER 16FR LF (SET/KITS/TRAYS/PACK) ×1 IMPLANT
TRAY FOLEY IC TEMP SENS 14FR (CATHETERS) ×1 IMPLANT
TRAY FOLEY IC TEMP SENS 16FR (CATHETERS) ×1 IMPLANT
TUBE SUCT INTRACARD DLP 20F (MISCELLANEOUS) ×1 IMPLANT
UNDERPAD 30X30 (UNDERPADS AND DIAPERS) ×2 IMPLANT
VENT LEFT HEART 12002 (CATHETERS) ×2
WATER STERILE IRR 1000ML POUR (IV SOLUTION) ×4 IMPLANT

## 2016-06-29 NOTE — Op Note (Signed)
CARDIOVASCULAR SURGERY OPERATIVE NOTE  02/15/2014  Surgeon:  Gaye Pollack, MD  First Assistant: Jadene Pierini,  PA-C   Preoperative Diagnosis:  Aortic arch aneurysm  Postoperative Diagnosis:  Same   Procedure:  1. Median Sternotomy 2. Right axillary artery cannulation using an 8 mm Hemashield graft. 3.   Extracorporeal circulation via the right axillary artery and right atrium 4.   Replacement of aortic arch with aorto-innominate and aorto-left carotid bypass using a 28 x 10 x 8 x 8 x 10 mm Gelweave graft.   Anesthesia:  General Endotracheal  Anesthesiologist: Dr. Laurie Panda   Clinical History/Surgical Indication:  The patient is a 82 year old woman with rheumatoid arthritis, fibromyalgia, IBS, DM and multiple other medial problems who underwent Bentall procedure using a composite pericardial valve/conduit and replacement of the ascending aorta and proximal aortic arch on 09/29/2012. Her ascending aorta was 5.8 cm at that time. The aorta had increased in size from 4.5 cm in 2009. She was getting some chest pains and back pain, but it was difficult to tell if this was due to her aneurysm or due to her fibromyalgia and arthritis. The distal anastomosis was done just proximal to the innominate artery where the aorta appeared to come back to more normal size of about 3 cm. She had a follow up CT of the chest on 01/27/2013 that showed the aortic arch to have a diameter beyond the repair of 4.8 cm and the descending aorta was 3.7 cm. A follow up scan on 02/14/2014 showed the maximal diameter of the aortic arch to be 4.9 cm. A CTA on 03/25/2015 showed the diameter of the aortic arch to have increased further to 5.3 cm. In January 2017 she started having back pain that progressed and she had an MRI showing discitis at the L1-2 space, osteomyelitis and epidural phlegmon. She was treated with antibiotics and had a disc aspiration but nothing grew out. When I saw her in June 2017 the aneurysm of  the distal ascending aorta and proximal arch wasstable at 5.2 cm. Her lumbar MR from 09/22/15 showedcontinued discitis/osteomyelitis at L1-2 with resolution of the epidural abscess/phlegmon. I thought it was best to continue following this for a while due to the question of continued infection. Since I last saw her she has been feeling ok. She does have some chronic back pain but denies any fever or chills. She is still fairly active.  Her most recent echo on 06/11/2016 showed an EF of 55-60% with mild LVH. The aortic bioprosthesis was functioning normally. The mean gradient ws 21 mm Hg. Cardiac cath prior to her surgery in 2014 showed no significant coronary disease.  There has been progressive enlargement of the distal ascending and arch aneurysm to 5.6 cm from 5.2 cm in June 2017. This will most likely continue to enlarge and rupture if not repaired. She is 82 years old with rheumatoid arthritis, stage III CKD, fibromyalgia, IBS, DM and multiple other medial problems but continues to remain active. Surgical repair of this is the best option for her and I think she is still an operative candidate despite the risk. The options are debranching of the arch and stent grafting vs open arch replacement under circulatory arrest. Debranching and stent grafting would still require a redo sternotomy and grafting from the proximal aortic graft up to the innominate and left carotid with a carotid to left subclavian bypass or transposition. This is a difficult surgery under redo circumstances with a large arch aneurysm distorting  the arch vessels and making exposure difficulty. Then she would require stent grafting which may be difficult due to the angulation of the aorta. Open repair may not be any more difficult but has the risk of circulatory arrest in an 82 year old pt. I think that open surgical repair with arch replacement is the best option.  I don't think another cardiac cath is needed since her cath in 2014  showed no coronary disease and she has no anginal symptoms. I discussed the operative procedure with the patient  including alternatives, benefits and high risks; including but not limited to bleeding, blood transfusion, infection, stroke, myocardial infarction, heart block requiring a permanent pacemaker, organ dysfunction, and death.  [name] understands and agrees to proceed.    TEE performed by Dr. Laurie Panda:  This showed normal LV function. There was mild RV dilation and hypokinesis. The prosthetic aortic valve appeared normal with no AS or AI. There was trivial MR and mild TR.  Preparation:  The patient was seen in the preoperative holding area and the correct patient, correct operation were confirmed with the patient after reviewing the medical record and catheterization. The consent was signed by me. Preoperative antibiotics were given. A pulmonary arterial line and radial arterial line were placed by the anesthesia team. The patient was taken back to the operating room and positioned supine on the operating room table. After being placed under general endotracheal anesthesia by the anesthesia team a foley catheter was placed. The neck, chest, abdomen, and both legs were prepped with betadine soap and solution and draped in the usual sterile manner. A surgical time-out was taken and the correct patient and operative procedure were confirmed with the nursing and anesthesia staff.   Right axillary artery exposure and cannulation:  A transverse incision was made below the right clavicle. The pectoralis major muscle was split along its fibers and the pectoralis minor muscle was retracted laterally. The brachial plexus was identified and gently retracted laterally to expose the axillary artery.  The artery was controlled proximally and distally with vessel loops. The patient was fully heparinized and ACT maintained greater than 400. The axillary artery was clamped proximally and distally  with peripheral Debakey clamps. It was opened longitudinally. The wall was thick, particularly the intima which looked edematous and inflammatory. There was some plaque in the wall. An 8 mm Hemashield dacron graft was anastomosed in an end to side manner using continuous 5-0 prolene suture. CoSeal was applied for hemostasis and the clamp removed. The graft was connected to the arterial end of the bypass circuit.   Cardiopulmonary Bypass:  A redo median sternotomy was performed using the oscillating saw without difficulty.  The sternum was retracted with bone hooks. The heart was dissected from the back of the sternum using electrocautery. The pericardium was opened in the midline. Right ventricular function appeared normal. Dissection was performed to expose the right atrium. Venous cannulation was performed via the right atrial appendage using a two-staged venous cannula. A temperature probe was inserted into the interventricular septum and an insulating pad was placed in the pericardium. CO2 was insufflated into the pericardium throughout the case to minimize intracardiac air.   Resection and grafting of aortic arch:  The patient was placed on cardiopulmonary bypass and a left ventricular vent was placed via the right superior pulmonary vein. Systemic cooling was begun with a goal temperature of 18 degrees centigrade by bladder and rectal temperature probes. A retrograde cardioplegia cannula was placed through  the right atrium into the coronary sinus without difficulty. While cooling, the left common carotid and innominate arteries were dissected free and encircled with tapes. After 30 minutes of cooling the target temperature of 20 degrees centigrade was reached. Cerebral oximetry was 70% bilaterally. BIS was zero. The patient was given Propofol and 125 mg of Solumedrol by anesthesia. The head was packed in ice. The bed was placed in steep trendelenburg. Circulatory arrest was begun and the blood volume  emptied into the venous reservoir. The innominate and left carotid arteries were occluded with Satinsky clamps. Continuous antegrade cerebral perfusion was begun via the right axillary artery. Cold blood retrograde cardioplegia was given and myocardial temperature dropped to 10 degrees centigrade. Additional doses were given at approximately 20 minute intervals throughout the period of circulatory arrest and cross-clamping. Complete diastolic arrest was maintained.The aorta was transected just proximal to the left subclavian artery. The aortic diameter was measured at 28 mm here. A 28 x 10 x 8 x 8 x 10 mm Gelweave vascular graft was prepared. ( Catalog # [id number], Lot # [id number], SN [id number]). One of the 8 mm side arm grafts was ligated with a heavy silk tie and suture ligated with a  3-0 prolene pledgetted horizontal mattress stitch.  The end of the 28 mm graft was anastomosed to the distal aortic arch in an end to end manner using 3-0 prolene continuous suture with a felt strip to reinforce the anastomisis. A light coating of CoSeal was applied to seal needle holes. The other end of the Y in the arterial end of the bypass circuit was then connected to the 47mm side arm graft. The two other sidearm grafts were controlled with vascular clamps.  I decided to do the bypass to the left common carotid artery during the circulatory arrest period because exposure was difficult and I thought it may be easier before distending the arch graft. After completing the left carotid bypass the aortic graft was cross-clamped proximal to the side arm grafts and full CPB support was resumed. Circulatory arrest time with antegrade cerebral perfusion was 65 minutes.  The cerebral oximetry showed normal oximetry bilaterally throughout the case including during the circulatory arrest and antegrade cerebral perfusion.  The patient was rewarmed to 37 degrees.   Aorto-left carotid and aorto-innominate bypass:  The 8 mm sidearm  graft was brought under the bracheocephalic vein and anastomosed to the  left common carotid artery in an end to end manner using continuous 5-0 prolene suture. The clamps were removed from the left carotid and the graft. Then the 10 mm sidearm graft was brought under the bracheocephalic vein and cut to the appropriate length. It was anastomosed to the innominate artery in an end to end manner using continuous 5-0 prolene suture. The clamps were removed.   Proximal anastomosis:  The old ascending aortic graft was densely adherent to the right pulmonary artery and therefore I decided to anastomose the new arch graft to the old aortic root graft. The old ascending aortic graft was opened longitudinally down to the old anastomosis with the root graft. This anastomosis was transected and the excess ascending graft was excised leaving only the posterior wall that was adherent to the right pulmonary artery. The new arch graft was cut to the appropriate length and anastomosed to the old root graft in an end to end manner using continuous 3-0 Prolene suture..  The a vent cannula was placed into the arch graft through a 4-0 Prolene horizontal mattress  suture.   Completion:  De-airing maneuvers were performed and the head place in trendelenburg position.  The crossclamp was removed with a time of 51 minutes. There was spontaneous return of ventricular fibrillation and the patient was defibrillated to sinus rhythm. The vascular anastomoses all appeared hemostatic. Two temporary epicardial pacing wires were placed on the right atrium and two on the right ventricle. The patient was weaned from CPB without difficulty on no inotropes. CPB time was 183 minutes. Cardiac output was 4 LPM. TEE showed normal LV and RV function with no AI and unchanged trivial MR. Heparin was fully reversed with protamine and the venous cannula removed. The arterial sidearm graft and the axillary artery graft were ligated with a heavy silk tie  and suture ligated with a pledgetted 3-0 prolene suture. Hemostasis was achieved. Mediastinal and left pleural drainage tubes were placed. The sternum was closed with  #6 stainless steel wires. The fascia was closed with continuous # 1 vicryl suture. The subcutaneous tissue was closed with 2-0 vicryl continuous suture. The skin was closed with 3-0 vicryl subcuticular suture. The right subclavicular incision was closed in layers in a similar manner. All sponge, needle, and instrument counts were reported correct at the end of the case. Dry sterile dressings were placed over the incisions and around the chest tubes which were connected to pleurevac suction. The patient was then transported to the surgical intensive care unit in critical but stable condition.

## 2016-06-29 NOTE — Brief Op Note (Signed)
06/29/2016  1:22 PM  PATIENT:  [name]  82 y.o. female  PRE-OPERATIVE DIAGNOSIS:  Enlargement of the distal ascending and arch aneurysm   POST-OPERATIVE DIAGNOSIS:  Enlargement of the distal ascending and arch aneurysm   PROCEDURE:  Procedure(s) with comments: REDO STERNOTOMY (N/A) AORTIC ARCH REPLACEMENT WITH CIRC ARREST (N/A) - RIGHT AXILLARY CANNULATION  BILATERAL RADIAL A-LINE TRANSESOPHAGEAL ECHOCARDIOGRAM (TEE) (N/A) AORTO-AORTIC 28 HEMASHIELD GRAFT WITH AORTO-INNOMINATE AND AORTO-LEFT CAROTID ANASTAMOSIS  SURGEON:  Surgeon(s) and Role:    * Gaye Pollack, MD - Primary  PHYSICIAN ASSISTANT: WAYNE GOLD PA-C  ANESTHESIA:   general  EBL:  Total I/O In: 1000 [I.V.:1000] Out: 700 [Urine:700]  BLOOD ADMINISTERED:none  DRAINS: ROUTINE   LOCAL MEDICATIONS USED:  NONE  SPECIMEN:  Source of Specimen:  ASCENDING AORTIC ANEURYSM  DISPOSITION OF SPECIMEN:  PATHOLOGY  COUNTS:  YES  TOURNIQUET:  * No tourniquets in log *  DICTATION: .Dragon Dictation  PLAN OF CARE: Admit to inpatient   PATIENT DISPOSITION:  ICU - intubated and hemodynamically stable.   Delay start of Pharmacological VTE agent (>24hrs) due to surgical blood loss or risk of bleeding: yes  COMPLICATIONS: NO KNOWN

## 2016-06-29 NOTE — Progress Notes (Signed)
Pt flipped to CPAP/PS by RRT per protocol. Will continue to monitor.

## 2016-06-29 NOTE — Transfer of Care (Signed)
Immediate Anesthesia Transfer of Care Note  Patient: Diane Abbott  Procedure(s) Performed: Procedure(s) with comments: REDO STERNOTOMY (N/A) AORTIC ARCH REPLACEMENT WITH CIRC ARREST (N/A) - RIGHT AXILLARY CANNULATION  AORTO-AORTIC 28 HEMASHIELD GRAFT WITH AORTO-INNOMINATE AND AORTO-LEFT CAROTID ANASTAMOSIS (N/A) - RIGHT AXILLARY CANNULATION  BILATERAL RADIAL A-LINE TRANSESOPHAGEAL ECHOCARDIOGRAM (TEE) (N/A)  Patient Location: SICU  Anesthesia Type:General  Level of Consciousness: sedated and Patient remains intubated per anesthesia plan  Airway & Oxygen Therapy: Patient remains intubated per anesthesia plan and Patient placed on Ventilator (see vital sign flow sheet for setting)  Post-op Assessment: Report given to RN and Post -op Vital signs reviewed and stable  Post vital signs: Reviewed and stable  Last Vitals:  Vitals:   06/29/16 0604 06/29/16 1513  BP: 139/64 108/64  Pulse: 80 88  Resp: 20 12  Temp: 36.8 C     Last Pain:  Vitals:   06/29/16 0607  TempSrc:   PainSc: 9       Patients Stated Pain Goal: 3 ([id number])  Complications: No apparent anesthesia complications

## 2016-06-29 NOTE — Progress Notes (Signed)
[facility] BRIEF SICU PROGRESS NOTE  Day of Surgery  S/P Procedure(s) (LRB): REDO STERNOTOMY (N/A) AORTIC ARCH REPLACEMENT WITH CIRC ARREST (N/A) - RIGHT AXILLARY CANNULATION  AORTO-AORTIC 28 HEMASHIELD GRAFT WITH AORTO-INNOMINATE AND AORTO-LEFT CAROTID ANASTAMOSIS (N/A) TRANSESOPHAGEAL ECHOCARDIOGRAM (TEE) (N/A)   Starting to wake on vent AAI paced w/ stable hemodynamics on low dose Neo for BP support O2 sats 100% Chest tube output low UOP adequate Labs okay  Plan: Continue routine early postop  Rexene Alberts, MD 06/29/2016 7:56 PM

## 2016-06-29 NOTE — Progress Notes (Signed)
Pt extubated at 20:45 by RRT with RN at bedside. Now on 2L [place]. Will continue to monitor and do follow-up ABG at 21:45.

## 2016-06-29 NOTE — Procedures (Signed)
Extubation Procedure Note  Cardiac rapid wean complete w/o complication Pt follows all commands NIF: -44 VC: 1.2L Cuff leak present No Stridor Clearly speaks name/location Coughs/clears secretions Tolerating 42 [name]  Pt working with IS  Patient Details:   Name: Diane Abbott DOB: [birth year] MRN: [id number]   Airway Documentation:  Extubated @2045    Evaluation  O2 sats: stable throughout Complications: No apparent complications Patient did tolerate procedure well. Bilateral Breath Sounds: Clear   Yes  Sharen Hint 06/29/2016, 8:50 PM

## 2016-06-29 NOTE — OR Nursing (Signed)
Pt came back to OR with bag of clothing.  Will give clothing that is in a personal belongings bag to SICU RN.

## 2016-06-29 NOTE — Progress Notes (Signed)
RRT flipped vent settings to 40/4. Will continue to monitor.

## 2016-06-29 NOTE — Progress Notes (Deleted)
  Echocardiogram Echocardiogram Transesophageal has been performed.  [name] M 06/29/2016, 7:56 AM

## 2016-06-29 NOTE — OR Nursing (Signed)
Pt verbalized an allergy to latex.  Provider was informed.  Non latex foley used and staff used latex free gloves.  Dr Cyndia Bent requested use of red rubber keepers and his standard gloves.

## 2016-06-29 NOTE — Interval H&P Note (Signed)
History and Physical Interval Note:  06/29/2016 6:07 AM  Diane Abbott  has presented today for surgery, with the diagnosis of Enlargement of the distal ascending and arch aneurysm   The various methods of treatment have been discussed with the patient and family. After consideration of risks, benefits and other options for treatment, the patient has consented to  Procedure(s) with comments: REDO STERNOTOMY (N/A) AORTIC ARCH REPLACEMENT WITH CIRC ARREST (N/A) - RIGHT AXILLARY CANNULATION  BILATERAL RADIAL A-LINE TRANSESOPHAGEAL ECHOCARDIOGRAM (TEE) (N/A) as a surgical intervention .  The patient's history has been reviewed, patient examined, no change in status, stable for surgery.  I have reviewed the patient's chart and labs.  Questions were answered to the patient's satisfaction.     Gaye Pollack

## 2016-06-29 NOTE — Anesthesia Procedure Notes (Signed)
Procedure Name: Intubation Date/Time: 06/29/2016 8:00 AM Performed by: Julieta Bellini Pre-anesthesia Checklist: Patient identified, Emergency Drugs available, Suction available and Patient being monitored Patient Re-evaluated:Patient Re-evaluated prior to inductionOxygen Delivery Method: Circle system utilized Preoxygenation: Pre-oxygenation with 100% oxygen Intubation Type: IV induction Ventilation: Mask ventilation without difficulty Laryngoscope Size: Mac and 3 Grade View: Grade I Tube type: Oral Tube size: 7.0 mm Number of attempts: 1 Airway Equipment and Method: Stylet Placement Confirmation: ETT inserted through vocal cords under direct vision,  positive ETCO2 and breath sounds checked- equal and bilateral Secured at: 22 cm Tube secured with: Tape Dental Injury: Teeth and Oropharynx as per pre-operative assessment  Comments: Attempted intubation with a 7.5cm tube, tight fit. Changed to 7.0cm ETT.

## 2016-06-30 ENCOUNTER — Encounter (HOSPITAL_COMMUNITY): Payer: Self-pay | Admitting: Surgery

## 2016-06-30 ENCOUNTER — Inpatient Hospital Stay (HOSPITAL_COMMUNITY): Payer: Medicare HMO

## 2016-06-30 LAB — CREATININE, SERUM
Creatinine, Ser: 1.32 mg/dL — ABNORMAL HIGH (ref 0.44–1.00)
GFR calc Af Amer: 43 mL/min — ABNORMAL LOW (ref >60)
GFR calc non Af Amer: 37 mL/min — ABNORMAL LOW (ref >60)

## 2016-06-30 LAB — CBC
HCT: 32.5 % — ABNORMAL LOW (ref 36.0–46.0)
HEMATOCRIT: 31 % — AB (ref 36.0–46.0)
HEMOGLOBIN: 10.3 g/dL — AB (ref 12.0–15.0)
Hemoglobin: 10.9 g/dL — ABNORMAL LOW (ref 12.0–15.0)
MCH: 29 pg (ref 26.0–34.0)
MCH: 29.6 pg (ref 26.0–34.0)
MCHC: 33.2 g/dL (ref 30.0–36.0)
MCHC: 33.5 g/dL (ref 30.0–36.0)
MCV: 87.3 fL (ref 78.0–100.0)
MCV: 88.3 fL (ref 78.0–100.0)
PLATELETS: 169 10*3/uL (ref 150–400)
Platelets: 158 10*3/uL (ref 150–400)
RBC: 3.55 MIL/uL — AB (ref 3.87–5.11)
RBC: 3.68 MIL/uL — ABNORMAL LOW (ref 3.87–5.11)
RDW: 13.7 % (ref 11.5–15.5)
RDW: 13.9 % (ref 11.5–15.5)
WBC: 6.6 10*3/uL (ref 4.0–10.5)
WBC: 9 10*3/uL (ref 4.0–10.5)

## 2016-06-30 LAB — GLUCOSE, CAPILLARY
GLUCOSE-CAPILLARY: 121 mg/dL — AB (ref 65–99)
GLUCOSE-CAPILLARY: 134 mg/dL — AB (ref 65–99)
GLUCOSE-CAPILLARY: 90 mg/dL (ref 65–99)
Glucose-Capillary: 112 mg/dL — ABNORMAL HIGH (ref 65–99)
Glucose-Capillary: 116 mg/dL — ABNORMAL HIGH (ref 65–99)
Glucose-Capillary: 117 mg/dL — ABNORMAL HIGH (ref 65–99)
Glucose-Capillary: 122 mg/dL — ABNORMAL HIGH (ref 65–99)
Glucose-Capillary: 91 mg/dL (ref 65–99)

## 2016-06-30 LAB — POCT I-STAT, CHEM 8
BUN: 25 mg/dL — ABNORMAL HIGH (ref 6–20)
CALCIUM ION: 1.23 mmol/L (ref 1.15–1.40)
CREATININE: 1.4 mg/dL — AB (ref 0.44–1.00)
Chloride: 98 mmol/L — ABNORMAL LOW (ref 101–111)
GLUCOSE: 108 mg/dL — AB (ref 65–99)
HCT: 31 % — ABNORMAL LOW (ref 36.0–46.0)
HEMOGLOBIN: 10.5 g/dL — AB (ref 12.0–15.0)
Potassium: 4.5 mmol/L (ref 3.5–5.1)
Sodium: 137 mmol/L (ref 135–145)
TCO2: 28 mmol/L (ref 0–100)

## 2016-06-30 LAB — PREPARE PLATELET PHERESIS
BLOOD PRODUCT EXPIRATION DATE: 201802272359
Blood Product Expiration Date: 201802272359
ISSUE DATE / TIME: 201802261333
ISSUE DATE / TIME: 201802261333
UNIT TYPE AND RH: 5100
Unit Type and Rh: 5100

## 2016-06-30 LAB — BASIC METABOLIC PANEL
Anion gap: 6 (ref 5–15)
BUN: 23 mg/dL — AB (ref 6–20)
CHLORIDE: 107 mmol/L (ref 101–111)
CO2: 23 mmol/L (ref 22–32)
Calcium: 8.4 mg/dL — ABNORMAL LOW (ref 8.9–10.3)
Creatinine, Ser: 1.17 mg/dL — ABNORMAL HIGH (ref 0.44–1.00)
GFR calc Af Amer: 50 mL/min — ABNORMAL LOW (ref >60)
GFR calc non Af Amer: 43 mL/min — ABNORMAL LOW (ref >60)
GLUCOSE: 118 mg/dL — AB (ref 65–99)
POTASSIUM: 4.7 mmol/L (ref 3.5–5.1)
Sodium: 136 mmol/L (ref 135–145)

## 2016-06-30 LAB — PREPARE CRYOPRECIPITATE
Blood Product Expiration Date: 201802261950
ISSUE DATE / TIME: 201802261413
UNIT TYPE AND RH: 6200

## 2016-06-30 LAB — MAGNESIUM
MAGNESIUM: 2.7 mg/dL — AB (ref 1.7–2.4)
Magnesium: 2.3 mg/dL (ref 1.7–2.4)

## 2016-06-30 MED ORDER — INSULIN ASPART 100 UNIT/ML ~~LOC~~ SOLN: 0.0000 [IU] | SUBCUTANEOUS | Status: DC

## 2016-06-30 MED ORDER — FENTANYL CITRATE (PF) 100 MCG/2ML IJ SOLN
100.0000 ug | INTRAMUSCULAR | Status: DC | PRN
  Administered 2016-06-30 – 2016-07-01 (×9): 100 ug via INTRAVENOUS
  Filled 2016-06-30 (×8): qty 2

## 2016-06-30 MED ORDER — INSULIN ASPART 100 UNIT/ML ~~LOC~~ SOLN
0.0000 [IU] | SUBCUTANEOUS | Status: DC
  Administered 2016-06-30: 2 [IU] via SUBCUTANEOUS

## 2016-06-30 MED ORDER — ROPINIROLE HCL 1 MG PO TABS
2.0000 mg | ORAL_TABLET | Freq: Three times a day (TID) | ORAL | Status: DC
  Administered 2016-06-30 – 2016-07-04 (×12): 2 mg via ORAL
  Filled 2016-06-30 (×14): qty 2

## 2016-06-30 MED ORDER — OXYCODONE HCL 5 MG PO TABS
10.0000 mg | ORAL_TABLET | ORAL | Status: DC | PRN
  Administered 2016-06-30: 10 mg via ORAL
  Filled 2016-06-30: qty 2

## 2016-06-30 MED ORDER — ENOXAPARIN SODIUM 40 MG/0.4ML ~~LOC~~ SOLN
40.0000 mg | Freq: Every day | SUBCUTANEOUS | Status: DC
  Administered 2016-06-30 – 2016-07-03 (×4): 40 mg via SUBCUTANEOUS
  Filled 2016-06-30 (×4): qty 0.4

## 2016-06-30 MED ORDER — FUROSEMIDE 10 MG/ML IJ SOLN
40.0000 mg | Freq: Once | INTRAMUSCULAR | Status: AC
  Administered 2016-06-30: 40 mg via INTRAVENOUS
  Filled 2016-06-30: qty 4

## 2016-06-30 NOTE — Progress Notes (Signed)
1 Day Post-Op Procedure(s) (LRB): REDO STERNOTOMY (N/A) AORTIC ARCH REPLACEMENT WITH CIRC ARREST (N/A) - RIGHT AXILLARY CANNULATION  AORTO-AORTIC 28 HEMASHIELD GRAFT WITH AORTO-INNOMINATE AND AORTO-LEFT CAROTID ANASTAMOSIS (N/A) TRANSESOPHAGEAL ECHOCARDIOGRAM (TEE) (N/A) Subjective:  Complains of pain.   Objective: Vital signs in last 24 hours: Temp:  [95.2 F (35.1 C)-99 F (37.2 C)] 98.8 F (37.1 C) (02/27 0745) Pulse Rate:  [65-88] 78 (02/27 0745) Cardiac Rhythm: Normal sinus rhythm (02/27 0400) Resp:  [9-24] 13 (02/27 0745) BP: (92-143)/(57-84) 131/67 (02/27 0700) SpO2:  [92 %-100 %] 97 % (02/27 0745) Arterial Line BP: (77-153)/(46-87) 129/68 (02/27 0745) FiO2 (%):  [40 %-50 %] 40 % (02/26 2015) Weight:  [63.7 kg (140 lb 6.9 oz)-68.7 kg (151 lb 7.3 oz)] 68.7 kg (151 lb 7.3 oz) (02/27 0500)  Hemodynamic parameters for last 24 hours: PAP: (14-31)/(2-19) 29/12 CO:  [3.1 L/min-3.7 L/min] 3.3 L/min CI:  [1.9 L/min/m2-2.3 L/min/m2] 2 L/min/m2  Intake/Output from previous day: 02/26 0701 - 02/27 0700 In: 6135.7 [P.O.:180; I.V.:3294.7; Blood:1155; IV Piggyback:1366] Out: [id number] [Urine:2365; Blood:1400; Chest Tube:480] Intake/Output this shift: No intake/output data recorded.  General appearance: alert and cooperative Neurologic: intact Heart: regular rate and rhythm, S1, S2 normal, no murmur, click, rub or gallop Lungs: clear to auscultation bilaterally Extremities: edema mild Wound: dressings dry  Lab Results:  Recent Labs  06/29/16 2121 06/30/16 0359  WBC 4.9 6.6  HGB 10.9*  10.7* 10.3*  HCT 32.0*  32.3* 31.0*  PLT 164 158   BMET:  Recent Labs  06/29/16 2121 06/30/16 0359  NA 138 136  K 4.8 4.7  CL 106 107  CO2  --  23  GLUCOSE 140* 118*  BUN 28* 23*  CREATININE 1.10*  1.21* 1.17*  CALCIUM  --  8.4*    PT/INR:  Recent Labs  06/29/16 1515  LABPROT 18.1*  INR 1.48   ABG    Component Value Date/Time   PHART 7.342 (L) 06/29/2016 2213   HCO3  22.7 06/29/2016 2213   TCO2 24 06/29/2016 2213   ACIDBASEDEF 3.0 (H) 06/29/2016 2213   O2SAT 97.0 06/29/2016 2213   CBG (last 3)   Recent Labs  06/30/16 0157 06/30/16 0257 06/30/16 0358  GLUCAP 116* 121* 117*   CLINICAL DATA:  82 year old female status post repair of aortic arch aneurysm with aorto-innominate and aorto-left carotid bypass using a 28 x 10 x 8 x 8 x 10 mm Gelweave graft. Initial encounter.  EXAM: PORTABLE CHEST 1 VIEW  COMPARISON:  06/29/2016 and earlier.  FINDINGS: Portable AP semi upright view at 0553 hours. Extubated. Enteric tube removed. Improved lung volumes.  Right IJ Swan-Ganz catheter remains in place, tip now at the level of the right main pulmonary artery. Mediastinal and 2 left chest tubes remain in place.  No pneumothorax or pulmonary edema. Regressed patchy retrocardiac opacity. Stable cardiac size and mediastinal contours. The right lung remains clear. Negative visible bowel gas pattern. Stable cholecystectomy clips.  IMPRESSION: 1. Extubated and enteric tube removed. Improved lung volumes and regressed left lung atelectasis. 2. Swan-Ganz catheter tip at the right main pulmonary artery level now. Otherwise stable lines and tubes. No pneumothorax.   Electronically Signed   By: Genevie Ann M.D.   On: 06/30/2016 07:31  ECG: NSR, no acute changes  Assessment/Plan: S/P Procedure(s) (LRB): REDO STERNOTOMY (N/A) AORTIC ARCH REPLACEMENT WITH CIRC ARREST (N/A) - RIGHT AXILLARY CANNULATION  AORTO-AORTIC 28 HEMASHIELD GRAFT WITH AORTO-INNOMINATE AND AORTO-LEFT CAROTID ANASTAMOSIS (N/A) TRANSESOPHAGEAL ECHOCARDIOGRAM (TEE) (N/A)  She is hemodynamically  stable in sinus rhythm. Start low dose Lopressor. Chronic pain due to DJD on Oxycontin 10 bid at home. Will use Oxy IR 10 every 3 hrs prn for now. Pain should get better once tubes out. Mobilize Diuresis Diabetes control: preop Hgb A1c 5.5. Continue CBG's and SSI d/c  tubes/lines Continue foley due to diuresing patient and patient in ICU See progression orders   LOS: 1 day    Gaye Pollack 06/30/2016

## 2016-06-30 NOTE — Progress Notes (Signed)
CT surgery p.m. Rounds Patient examined and record reviewed.Hemodynamics stable,labs satisfactory.Patient had stable day.Continue current care. [name] 06/30/2016

## 2016-06-30 NOTE — Anesthesia Postprocedure Evaluation (Addendum)
Anesthesia Post Note  Patient: TAMETRA KOURY  Procedure(s) Performed: Procedure(s) (LRB): REDO STERNOTOMY (N/A) AORTIC ARCH REPLACEMENT WITH CIRC ARREST (N/A) - RIGHT AXILLARY CANNULATION  AORTO-AORTIC 28 HEMASHIELD GRAFT WITH AORTO-INNOMINATE AND AORTO-LEFT CAROTID ANASTAMOSIS (N/A) TRANSESOPHAGEAL ECHOCARDIOGRAM (TEE) (N/A)  Patient location during evaluation: ICU Anesthesia Type: General Level of consciousness: sedated Pain management: pain level controlled Vital Signs Assessment: post-procedure vital signs reviewed and stable Respiratory status: patient remains intubated per anesthesia plan Cardiovascular status: stable Anesthetic complications: no       Last Vitals:  Vitals:   06/30/16 1600 06/30/16 1607  BP: (!) 94/52   Pulse: 60   Resp: 16   Temp:  36.9 C    Last Pain:  Vitals:   06/30/16 1607  TempSrc: Oral  PainSc: 2                  Loray Akard

## 2016-06-30 NOTE — Care Management Note (Addendum)
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  Extubated on 2/26, now on 2 liters, medial stinal and pl chest tube d'cd, mobolize, diuresis, start clears.   She lives at home with her daughter , [name] , who she states is in bad health but she is able to assist her if needed.  She has had [place] services in the past with [facility] but she had a different insurance then.  She has a pcp, Dr. Galen Manila she has medication coverage and transportation at dc.  Per RN, she has been up walking in room ,  NCM will cont to follow for dc needs.    3/1 1113 Jerimie Mancuso RN, BSN - BP down overnight after lopressor and HR in 60k's, will stop lopressor, she also has oral thrush, will start diflucan per MD note.  NCM will cont to follow for dc needs.               Action/Plan:   Expected Discharge Date:                  Expected Discharge Plan:     In-House Referral:     Discharge planning Services  CM Consult  Post Acute Care Choice:    Choice offered to:     DME Arranged:    DME Agency:     HH Arranged:    HH Agency:     Status of Service:  In process, will continue to follow  If discussed at Long Length of Stay Meetings, dates discussed:    Additional Comments:  Zenon Mayo, RN 06/30/2016, 5:09 PM

## 2016-07-01 ENCOUNTER — Inpatient Hospital Stay (HOSPITAL_COMMUNITY): Payer: Medicare HMO

## 2016-07-01 LAB — CBC
HCT: 31 % — ABNORMAL LOW (ref 36.0–46.0)
Hemoglobin: 10.1 g/dL — ABNORMAL LOW (ref 12.0–15.0)
MCH: 29 pg (ref 26.0–34.0)
MCHC: 32.6 g/dL (ref 30.0–36.0)
MCV: 89.1 fL (ref 78.0–100.0)
Platelets: 136 10*3/uL — ABNORMAL LOW (ref 150–400)
RBC: 3.48 MIL/uL — ABNORMAL LOW (ref 3.87–5.11)
RDW: 13.9 % (ref 11.5–15.5)
WBC: 6.6 10*3/uL (ref 4.0–10.5)

## 2016-07-01 LAB — GLUCOSE, CAPILLARY
GLUCOSE-CAPILLARY: 106 mg/dL — AB (ref 65–99)
Glucose-Capillary: 101 mg/dL — ABNORMAL HIGH (ref 65–99)
Glucose-Capillary: 139 mg/dL — ABNORMAL HIGH (ref 65–99)

## 2016-07-01 LAB — BASIC METABOLIC PANEL
Anion gap: 7 (ref 5–15)
BUN: 21 mg/dL — AB (ref 6–20)
CALCIUM: 8.6 mg/dL — AB (ref 8.9–10.3)
CO2: 30 mmol/L (ref 22–32)
CREATININE: 1.27 mg/dL — AB (ref 0.44–1.00)
Chloride: 100 mmol/L — ABNORMAL LOW (ref 101–111)
GFR calc Af Amer: 45 mL/min — ABNORMAL LOW (ref >60)
GFR, EST NON AFRICAN AMERICAN: 39 mL/min — AB (ref >60)
GLUCOSE: 99 mg/dL (ref 65–99)
Potassium: 3.9 mmol/L (ref 3.5–5.1)
Sodium: 137 mmol/L (ref 135–145)

## 2016-07-01 MED ORDER — FENTANYL CITRATE (PF) 100 MCG/2ML IJ SOLN
50.0000 ug | INTRAMUSCULAR | Status: DC | PRN
  Administered 2016-07-01 (×2): 50 ug via INTRAVENOUS
  Filled 2016-07-01 (×2): qty 2

## 2016-07-01 MED ORDER — HYDROMORPHONE HCL 2 MG PO TABS
4.0000 mg | ORAL_TABLET | ORAL | Status: DC | PRN
  Administered 2016-07-01 (×3): 4 mg via ORAL
  Administered 2016-07-02: 2 mg via ORAL
  Filled 2016-07-01 (×4): qty 2

## 2016-07-01 MED ORDER — POTASSIUM CHLORIDE CRYS ER 20 MEQ PO TBCR
20.0000 meq | EXTENDED_RELEASE_TABLET | Freq: Two times a day (BID) | ORAL | Status: AC
  Administered 2016-07-01 (×2): 20 meq via ORAL
  Filled 2016-07-01 (×2): qty 1

## 2016-07-01 NOTE — Progress Notes (Signed)
2 Days Post-Op Procedure(s) (LRB): REDO STERNOTOMY (N/A) AORTIC ARCH REPLACEMENT WITH CIRC ARREST (N/A) - RIGHT AXILLARY CANNULATION  AORTO-AORTIC 28 HEMASHIELD GRAFT WITH AORTO-INNOMINATE AND AORTO-LEFT CAROTID ANASTAMOSIS (N/A) TRANSESOPHAGEAL ECHOCARDIOGRAM (TEE) (N/A) Subjective:  She continues to complain of a lot of pain from chest incision and back. Has fibromyalgia and chronic pain on Oxycontin at home.  Objective: Vital signs in last 24 hours: Temp:  [97.7 F (36.5 C)-98.5 F (36.9 C)] 98.2 F (36.8 C) (02/28 0700) Pulse Rate:  [59-71] 66 (02/28 1100) Cardiac Rhythm: Normal sinus rhythm (02/28 0800) Resp:  [9-19] 19 (02/28 1100) BP: (94-148)/(47-75) 123/59 (02/28 1100) SpO2:  [80 %-100 %] 92 % (02/28 1100) Weight:  [143 lb 4.8 oz (65 kg)] 143 lb 4.8 oz (65 kg) (02/28 0500)  Hemodynamic parameters for last 24 hours:    Intake/Output from previous day: 02/27 0701 - 02/28 0700 In: 821.8 [P.O.:30; I.V.:561.8; IV Piggyback:150] Out: 4615 [Urine:4615] Intake/Output this shift: Total I/O In: 250 [P.O.:240; I.V.:10] Out: 170 [Urine:170]  General appearance: alert and cooperative Neurologic: intact Heart: regular rate and rhythm, S1, S2 normal, no murmur, click, rub or gallop Lungs: clear to auscultation bilaterally Extremities: extremities normal, atraumatic, no cyanosis or edema Wound: dressings dry  Lab Results:  Recent Labs  06/30/16 1627 07/01/16 0426  WBC 9.0 6.6  HGB 10.9* 10.1*  HCT 32.5* 31.0*  PLT 169 136*   BMET:  Recent Labs  06/30/16 0359 06/30/16 1614 06/30/16 1627 07/01/16 0426  NA 136 137  --  137  K 4.7 4.5  --  3.9  CL 107 98*  --  100*  CO2 23  --   --  30  GLUCOSE 118* 108*  --  99  BUN 23* 25*  --  21*  CREATININE 1.17* 1.40* 1.32* 1.27*  CALCIUM 8.4*  --   --  8.6*    PT/INR:  Recent Labs  06/29/16 1515  LABPROT 18.1*  INR 1.48   ABG    Component Value Date/Time   PHART 7.342 (L) 06/29/2016 2213   HCO3 22.7 06/29/2016  2213   TCO2 28 06/30/2016 1614   ACIDBASEDEF 3.0 (H) 06/29/2016 2213   O2SAT 97.0 06/29/2016 2213   CBG (last 3)   Recent Labs  06/30/16 2356 07/01/16 0400 07/01/16 0829  GLUCAP 91 106* 101*   CXR: ok  Assessment/Plan: S/P Procedure(s) (LRB): REDO STERNOTOMY (N/A) AORTIC ARCH REPLACEMENT WITH CIRC ARREST (N/A) - RIGHT AXILLARY CANNULATION  AORTO-AORTIC 28 HEMASHIELD GRAFT WITH AORTO-INNOMINATE AND AORTO-LEFT CAROTID ANASTAMOSIS (N/A) TRANSESOPHAGEAL ECHOCARDIOGRAM (TEE) (N/A)  She is doing well overall in sinus rhythm.  Her main complaint is of pain. Will switch to oral Dilaudid at her request since she said that works best for her. Use IV Fentanyl as needing for breakthrough pain.  DC sleeve and foley.  Continue IS and ambulation     LOS: 2 days    Gaye Pollack 07/01/2016

## 2016-07-01 NOTE — Progress Notes (Signed)
      [address]       [place] [address]             [phone]      POD # 2 replacement aortic arch  BP (!) 97/45 (BP Location: Left Arm)   Pulse 66   Temp 98.1 F (36.7 C) (Oral)   Resp 17   Ht 5\' 1"  (1.549 m)   Wt 143 lb 4.8 oz (65 kg)   SpO2 96%   BMI 27.08 kg/m   2L [place] 96% sat  Intake/Output Summary (Last 24 hours) at 07/01/16 1706 Last data filed at 07/01/16 1245  Gross per 24 hour  Intake             1000 ml  Output             2360 ml  Net            -1360 ml   No PM labs  Remo Lipps C. Roxan Hockey, MD [facility] [phone]

## 2016-07-02 MED ORDER — BISACODYL 10 MG RE SUPP: 10.0000 mg | Freq: Every day | RECTAL | Status: DC | PRN

## 2016-07-02 MED ORDER — MOVING RIGHT ALONG BOOK
Freq: Once | Status: AC
  Administered 2016-07-02: 1
  Filled 2016-07-02: qty 1

## 2016-07-02 MED ORDER — DOCUSATE SODIUM 100 MG PO CAPS
200.0000 mg | ORAL_CAPSULE | Freq: Every day | ORAL | Status: DC
  Filled 2016-07-02 (×2): qty 2

## 2016-07-02 MED ORDER — FLUCONAZOLE 200 MG PO TABS
400.0000 mg | ORAL_TABLET | Freq: Once | ORAL | Status: AC
Start: 2016-07-02 — End: 2016-07-02
  Administered 2016-07-02: 400 mg via ORAL
  Filled 2016-07-02: qty 2

## 2016-07-02 MED ORDER — ASPIRIN EC 81 MG PO TBEC
81.0000 mg | DELAYED_RELEASE_TABLET | Freq: Every day | ORAL | Status: DC
  Administered 2016-07-03 – 2016-07-04 (×2): 81 mg via ORAL
  Filled 2016-07-02 (×2): qty 1

## 2016-07-02 MED ORDER — FAMOTIDINE 20 MG PO TABS
20.0000 mg | ORAL_TABLET | Freq: Two times a day (BID) | ORAL | Status: DC
  Administered 2016-07-02 – 2016-07-04 (×4): 20 mg via ORAL
  Filled 2016-07-02 (×5): qty 1

## 2016-07-02 MED ORDER — ONDANSETRON HCL 4 MG PO TABS: 4.0000 mg | ORAL_TABLET | Freq: Four times a day (QID) | ORAL | Status: DC | PRN

## 2016-07-02 MED ORDER — FLUCONAZOLE 100 MG PO TABS
200.0000 mg | ORAL_TABLET | Freq: Every day | ORAL | Status: DC
  Administered 2016-07-03 – 2016-07-04 (×2): 200 mg via ORAL
  Filled 2016-07-02 (×2): qty 2
  Filled 2016-07-02: qty 1
  Filled 2016-07-02: qty 2

## 2016-07-02 MED ORDER — HYDROMORPHONE HCL 2 MG PO TABS: 2.0000 mg | ORAL_TABLET | ORAL | Status: DC | PRN

## 2016-07-02 MED ORDER — ONDANSETRON HCL 4 MG/2ML IJ SOLN: 4.0000 mg | Freq: Four times a day (QID) | INTRAMUSCULAR | Status: DC | PRN

## 2016-07-02 MED ORDER — HYDROMORPHONE HCL 2 MG PO TABS
2.0000 mg | ORAL_TABLET | ORAL | Status: DC | PRN
  Administered 2016-07-02 – 2016-07-04 (×10): 2 mg via ORAL
  Filled 2016-07-02 (×10): qty 1

## 2016-07-02 MED ORDER — SODIUM CHLORIDE 0.9 % IV SOLN: 250.0000 mL | INTRAVENOUS | Status: DC | PRN

## 2016-07-02 MED ORDER — SODIUM CHLORIDE 0.9% FLUSH
3.0000 mL | Freq: Two times a day (BID) | INTRAVENOUS | Status: DC
  Administered 2016-07-02 – 2016-07-04 (×4): 3 mL via INTRAVENOUS

## 2016-07-02 MED ORDER — BISACODYL 5 MG PO TBEC: 10.0000 mg | DELAYED_RELEASE_TABLET | Freq: Every day | ORAL | Status: DC | PRN

## 2016-07-02 MED ORDER — SODIUM CHLORIDE 0.9% FLUSH: 3.0000 mL | INTRAVENOUS | Status: DC | PRN

## 2016-07-02 NOTE — Progress Notes (Signed)
Patient ID: Diane Abbott, female   DOB: [birth year], 82 y.o.   MRN: [id number]  SICU Evening Rounds:  Hemodynamically stable  Atrial paced 80.   Urine output ok  sleeping

## 2016-07-02 NOTE — Progress Notes (Addendum)
3 Days Post-Op Procedure(s) (LRB): REDO STERNOTOMY (N/A) AORTIC ARCH REPLACEMENT WITH CIRC ARREST (N/A) - RIGHT AXILLARY CANNULATION  AORTO-AORTIC 28 HEMASHIELD GRAFT WITH AORTO-INNOMINATE AND AORTO-LEFT CAROTID ANASTAMOSIS (N/A) TRANSESOPHAGEAL ECHOCARDIOGRAM (TEE) (N/A) Subjective:  Feels better today. Pain under good control on dilaudid 2 mg every 4 hrs prn. Slept some.  Feels like she is starting to get oral thrush. She has had this every time she gets antibiotics in the past and it only responds to Diflucan.  Objective: Vital signs in last 24 hours: Temp:  [97.3 F (36.3 C)-98.8 F (37.1 C)] 98.3 F (36.8 C) (03/01 0849) Pulse Rate:  [55-81] 80 (03/01 0830) Cardiac Rhythm: Atrial paced (03/01 0800) Resp:  [11-22] 14 (03/01 0830) BP: (82-123)/(41-66) 112/52 (03/01 0830) SpO2:  [88 %-99 %] 96 % (03/01 0830) Weight:  [63.6 kg (140 lb 3.2 oz)] 63.6 kg (140 lb 3.2 oz) (03/01 0600)  Hemodynamic parameters for last 24 hours:    Intake/Output from previous day: 02/28 0701 - 03/01 0700 In: 1450 [P.O.:1440; I.V.:10] Out: 720 [Urine:720] Intake/Output this shift: No intake/output data recorded.  General appearance: alert and cooperative Neurologic: intact Heart: regular rate and rhythm Lungs: clear to auscultation bilaterally Extremities: extremities normal, atraumatic, no cyanosis or edema Wound: incisions ok  Lab Results:  Recent Labs  06/30/16 1627 07/01/16 0426  WBC 9.0 6.6  HGB 10.9* 10.1*  HCT 32.5* 31.0*  PLT 169 136*   BMET:  Recent Labs  06/30/16 0359 06/30/16 1614 06/30/16 1627 07/01/16 0426  NA 136 137  --  137  K 4.7 4.5  --  3.9  CL 107 98*  --  100*  CO2 23  --   --  30  GLUCOSE 118* 108*  --  99  BUN 23* 25*  --  21*  CREATININE 1.17* 1.40* 1.32* 1.27*  CALCIUM 8.4*  --   --  8.6*    PT/INR:  Recent Labs  06/29/16 1515  LABPROT 18.1*  INR 1.48   ABG    Component Value Date/Time   PHART 7.342 (L) 06/29/2016 2213   HCO3 22.7  06/29/2016 2213   TCO2 28 06/30/2016 1614   ACIDBASEDEF 3.0 (H) 06/29/2016 2213   O2SAT 97.0 06/29/2016 2213   CBG (last 3)   Recent Labs  07/01/16 0400 07/01/16 0829 07/01/16 1144  GLUCAP 106* 101* 139*    Assessment/Plan: S/P Procedure(s) (LRB): REDO STERNOTOMY (N/A) AORTIC ARCH REPLACEMENT WITH CIRC ARREST (N/A) - RIGHT AXILLARY CANNULATION  AORTO-AORTIC 28 HEMASHIELD GRAFT WITH AORTO-INNOMINATE AND AORTO-LEFT CAROTID ANASTAMOSIS (N/A) TRANSESOPHAGEAL ECHOCARDIOGRAM (TEE) (N/A)  She is doing well POD 2. Her BP dropped down overnight after Lopressor dose and HR in the 60's so she is atrial paced this am. Will stop the Lopressor. She usually runs a low normal BP.  Weight is back to baseline.  Pain under control  Oral thrush: will start diflucan. She had bad esophageal candida the last time I operated on her which resolved with Diflucan for a few weeks.  Will transfer to 2W and continue mobilization   LOS: 3 days    Gaye Pollack 07/02/2016

## 2016-07-03 DIAGNOSIS — Z95828 Presence of other vascular implants and grafts: Secondary | ICD-10-CM

## 2016-07-03 LAB — TYPE AND SCREEN
ABO/RH(D): A POS
ANTIBODY SCREEN: NEGATIVE
UNIT DIVISION: 0
UNIT DIVISION: 0
UNIT DIVISION: 0
Unit division: 0
Unit division: 0
Unit division: 0

## 2016-07-03 LAB — BPAM RBC
BLOOD PRODUCT EXPIRATION DATE: 201803142359
BLOOD PRODUCT EXPIRATION DATE: 201803142359
Blood Product Expiration Date: 201803142359
Blood Product Expiration Date: 201803142359
Blood Product Expiration Date: 201803142359
Blood Product Expiration Date: 201803142359
ISSUE DATE / TIME: 201802260735
ISSUE DATE / TIME: 201802260735
ISSUE DATE / TIME: 201802260735
ISSUE DATE / TIME: 201802260735
UNIT TYPE AND RH: 6200
UNIT TYPE AND RH: 6200
UNIT TYPE AND RH: 6200
UNIT TYPE AND RH: 6200
Unit Type and Rh: 6200
Unit Type and Rh: 6200

## 2016-07-03 MED FILL — Sodium Chloride IV Soln 0.9%: INTRAVENOUS | Qty: 2000 | Status: AC

## 2016-07-03 MED FILL — Sodium Bicarbonate IV Soln 8.4%: INTRAVENOUS | Qty: 50 | Status: AC

## 2016-07-03 MED FILL — Electrolyte-R (PH 7.4) Solution: INTRAVENOUS | Qty: 4000 | Status: AC

## 2016-07-03 MED FILL — Heparin Sodium (Porcine) Inj 1000 Unit/ML: INTRAMUSCULAR | Qty: 10 | Status: AC

## 2016-07-03 MED FILL — Lidocaine HCl IV Inj 20 MG/ML: INTRAVENOUS | Qty: 5 | Status: AC

## 2016-07-03 MED FILL — Mannitol IV Soln 20%: INTRAVENOUS | Qty: 500 | Status: AC

## 2016-07-03 NOTE — Progress Notes (Addendum)
Patient education completed for removal of pacer wires, patient verbalised understanding, 4 pacer wires removed as ordered,well tolerated,patient now on bed rest, bed in lowest position, call light within reach will continue to monitor

## 2016-07-03 NOTE — Discharge Instructions (Signed)
Aortic ARCH Replacement, Care After Refer to this sheet in the next few weeks. These instructions provide you with information about caring for yourself after your procedure. Your health care provider may also give you more specific instructions. Your treatment has been planned according to current medical practices, but problems sometimes occur. Call your health care provider if you have any problems or questions after your procedure. What can I expect after the procedure? After the procedure, it is common to have:  Pain around your incision area.  A small amount of blood or clear fluid coming from your incision. Follow these instructions at home: Eating and drinking    Follow instructions from your health care provider about eating or drinking restrictions.  Limit alcohol intake to no more than 1 drink per day for nonpregnant women and 2 drinks per day for men. One drink equals 12 oz of beer, 5 oz of wine, or 1 oz of hard liquor.  Limit how much caffeine you drink. Caffeine can affect your heart's rate and rhythm.  Drink enough fluid to keep your urine clear or pale yellow.  Eat a heart-healthy diet. This should include plenty of fresh fruits and vegetables. If you eat meat, it should be lean cuts. Avoid foods that are:  High in salt, saturated fat, or sugar.  Canned or highly processed.  [place] Pro. Activity   Return to your normal activities as told by your health care provider. Ask your health care provider what activities are safe for you.  Exercise regularly once you have recovered, as told by your health care provider.  Avoid sitting for more than 2 hours at a time without moving. Get up and move around at least once every 1-2 hours. This helps to prevent blood clots in the legs.  Do not lift anything that is heavier than 10 lb (4.5 kg) until your health care provider approves.  Avoid pushing or pulling things with your arms until your health care provider approves. This  includes pulling on handrails to help you climb stairs. Incision care    Follow instructions from your health care provider about how to take care of your incision. Make sure you:  Wash your hands with soap and water before you change your bandage (dressing). If soap and water are not available, use hand sanitizer.  Change your dressing as told by your health care provider.  Leave stitches (sutures), skin glue, or adhesive strips in place. These skin closures may need to stay in place for 2 weeks or longer. If adhesive strip edges start to loosen and curl up, you may trim the loose edges. Do not remove adhesive strips completely unless your health care provider tells you to do that.  Check your incision area every day for signs of infection. Check for:  More redness, swelling, or pain.  More fluid or blood.  Warmth.  Pus or a bad smell. Medicines   Take over-the-counter and prescription medicines only as told by your health care provider.  If you were prescribed an antibiotic medicine, take it as told by your health care provider. Do not stop taking the antibiotic even if you start to feel better. Travel   Avoid airplane travel for as long as told by your health care provider.  When you travel, bring a list of your medicines and a record of your medical history with you. Carry your medicines with you. Driving   Ask your health care provider when it is safe for you to drive. Do  not drive until your health care provider approves.  Do not drive or operate heavy machinery while taking prescription pain medicine. Lifestyle    Do not use any tobacco products, such as cigarettes, chewing tobacco, or e-cigarettes. If you need help quitting, ask your health care provider.  Resume sexual activity as told by your health care provider. Do not use medicines for erectile dysfunction unless your health care provider approves, if this applies.  Work with your health care provider to keep  your blood pressure and cholesterol under control, and to manage any other heart conditions that you have.  Maintain a healthy weight. General instructions   Do not take baths, swim, or use a hot tub until your health care provider approves.  Do not strain to have a bowel movement.  Avoid crossing your legs while sitting down.  Check your temperature every day for a fever. A fever may be a sign of infection.  If you are a woman and you plan to become pregnant, talk with your health care provider before you become pregnant.  Wear compression stockings if your health care provider instructs you to do this. These stockings help to prevent blood clots and reduce swelling in your legs.  Tell all health care providers who care for you that you have an artificial (prosthetic) aortic valve. If you have or have had heart disease or endocarditis, tell all health care providers about these conditions as well.  Keep all follow-up visits as told by your health care provider. This is important. Contact a health care provider if:  You develop a skin rash.  You experience sudden, unexplained changes in your weight.  You have more redness, swelling, or pain around your incision.  You have more fluid or blood coming from your incision.  Your incision feels warm to the touch.  You have pus or a bad smell coming from your incision.  You have a fever. Get help right away if:  You develop chest pain that is different from the pain coming from your incision.  You develop shortness of breath or difficulty breathing.  You start to feel light-headed. These symptoms may represent a serious problem that is an emergency. Do not wait to see if the symptoms will go away. Get medical help right away. Call your local emergency services (911 in the [place] Do not drive yourself to the hospital. This information is not intended to replace advice given to you by your health care provider. Make sure you discuss  any questions you have with your health care provider. Document Released: 11/06/2004 Document Revised: 09/26/2015 Document Reviewed: 03/24/2015 Elsevier Interactive Patient Education  2017 [employer].

## 2016-07-03 NOTE — Progress Notes (Signed)
Patient ambulated 368ft in the hall using a front wheel rolling walker on room air, ambulation well tolerated, patient now resting quietly in bed, call bell within reach, will continue to monitor.

## 2016-07-03 NOTE — Discharge Summary (Signed)
Physician Discharge Summary  Patient ID: MIDORI SLACUM MRN: [id number] DOB/AGE: 80/26/1936 80 y.o.  Admit date: 06/29/2016 Discharge date: 07/04/2016  Admission Diagnoses:  Patient Active Problem List   Diagnosis Date Noted  . Thoracic ascending aortic aneurysm ([place]) 06/29/2016  . Upper respiratory infection 06/05/2016  . Anxiety state 01/02/2016  . Callus of foot 12/02/2015  . Leukopenia 11/11/2015  . Diskitis 08/29/2015  . Acute osteomyelitis of lumbar spine ([place]) 07/29/2015  . Thoracic compression fracture ([place]) 07/29/2015  . Discitis of lumbosacral region   . Epidural abscess   . Chronic renal insufficiency, stage III (moderate) 02/28/2015  . Hammer toe, acquired 11/29/2014  . Actinic keratoses 01/25/2014  . Thrombocytopenia due to drugs 11/24/2013  . Knee pain, right anterior 07/25/2013  . Atrophic vaginitis 03/23/2013  . S/P ascending aortic replacement 10/04/2012  . S/P AVR (aortic valve replacement) 10/04/2012  . Aortic aneurysm, thoracic ([place]) 09/14/2012  . Aortic insufficiency 09/14/2012  . Hyperglycemia 07/27/2012  . Wound, open, forearm 03/21/2012  . Osteoarthritis of left shoulder 12/07/2011  . Breast lump in female 04/07/2011  . COPD (chronic obstructive pulmonary disease) ([place]) 03/12/2011  . Esophagitis, unspecified 01/09/2011  . Insomnia 11/04/2010  . RESTLESS LEG SYNDROME 02/03/2010  . THRUSH 07/04/2009  . PRURITUS 05/16/2009  . PULMONARY EMBOLISM 09/14/2008  . Constipation 09/14/2008  . Edema 01/09/2008  . Pneumonia, organism unspecified(486) 07/13/2007  . Chest pain 07/13/2007  . Acute thromboembolism of deep veins of lower extremity ([place]) 07/08/2007  . GERD 07/08/2007  . Irritable bowel syndrome 07/08/2007  . Diffuse connective tissue disease ([place]) 07/08/2007  . Myalgia and myositis 07/08/2007  . CYSTITIS 05/31/2007  . Depression 05/11/2007  . B12 deficiency 03/10/2007  . Vitamin D deficiency 03/10/2007  . OSTEOARTHRITIS 03/10/2007  .  OSTEOPENIA 03/10/2007  . VERTIGO 03/10/2007  . PULMONARY EMBOLISM, HX OF 03/10/2007  . Rheumatoid arthritis ([place]) 11/23/2006   Discharge Diagnoses:   Patient Active Problem List   Diagnosis Date Noted  . H/O aortic arch replacement 07/03/2016  . Thoracic ascending aortic aneurysm ([place]) 06/29/2016  . Upper respiratory infection 06/05/2016  . Anxiety state 01/02/2016  . Callus of foot 12/02/2015  . Leukopenia 11/11/2015  . Diskitis 08/29/2015  . Acute osteomyelitis of lumbar spine ([place]) 07/29/2015  . Thoracic compression fracture ([place]) 07/29/2015  . Discitis of lumbosacral region   . Epidural abscess   . Chronic renal insufficiency, stage III (moderate) 02/28/2015  . Hammer toe, acquired 11/29/2014  . Actinic keratoses 01/25/2014  . Thrombocytopenia due to drugs 11/24/2013  . Knee pain, right anterior 07/25/2013  . Atrophic vaginitis 03/23/2013  . S/P ascending aortic replacement 10/04/2012  . S/P AVR (aortic valve replacement) 10/04/2012  . Aortic aneurysm, thoracic ([place]) 09/14/2012  . Aortic insufficiency 09/14/2012  . Hyperglycemia 07/27/2012  . Wound, open, forearm 03/21/2012  . Osteoarthritis of left shoulder 12/07/2011  . Breast lump in female 04/07/2011  . COPD (chronic obstructive pulmonary disease) ([place]) 03/12/2011  . Esophagitis, unspecified 01/09/2011  . Insomnia 11/04/2010  . RESTLESS LEG SYNDROME 02/03/2010  . THRUSH 07/04/2009  . PRURITUS 05/16/2009  . PULMONARY EMBOLISM 09/14/2008  . Constipation 09/14/2008  . Edema 01/09/2008  . Pneumonia, organism unspecified(486) 07/13/2007  . Chest pain 07/13/2007  . Acute thromboembolism of deep veins of lower extremity ([place]) 07/08/2007  . GERD 07/08/2007  . Irritable bowel syndrome 07/08/2007  . Diffuse connective tissue disease ([place]) 07/08/2007  . Myalgia and myositis 07/08/2007  . CYSTITIS 05/31/2007  . Depression 05/11/2007  . B12 deficiency  03/10/2007  . Vitamin D deficiency 03/10/2007  . OSTEOARTHRITIS  03/10/2007  . OSTEOPENIA 03/10/2007  . VERTIGO 03/10/2007  . PULMONARY EMBOLISM, HX OF 03/10/2007  . Rheumatoid arthritis ([place]) 11/23/2006   Discharged Condition: good  History of Present Illness:  Ms. Pazmino is an 82 yo female with known history of RA, Fibromyalgia, IBS, and DM.  She is also S/P Bentall procedure performed in 09/2012 using a pericardial valve/conduit and replacement of her ascending aorta and proximal aortic arch.  At that time of the procedure her ascending aorta was 5.8 cm.  The aorta had increased in size from 4.5 cm in 2009. She was getting some chest pains and back pain, but it was difficult to tell if this was due to her aneurysm or due to her fibromyalgia and arthritis. The distal anastomosis was done just proximal to the innominate artery where the aorta appeared to come back to more normal size of about 3 cm. She had a follow up CT of the chest on 01/27/2013 that showed the aortic arch to have a diameter beyond the repair of 4.8 cm and the descending aorta was 3.7 cm. A follow up scan on 02/14/2014 showed the maximal diameter of the aortic arch to be 4.9 cm. A CTA on 03/25/2015 showed the diameter of the aortic arch to have increased further to 5.3 cm. In January 2017 she started having back pain that progressed and she had an MRI showing discitis at the L1-2 space, osteomyelitis and epidural phlegmon. She was treated with antibiotics and had a disc aspiration but nothing grew out. In June 2017 she was evaluated by Dr. Cyndia Bent at which time the aneurysm of the distal ascending aorta and proximal arch wasstable at 5.2 cm.  Due to the concern of continued spinal infection it was felt it best to continue to monitor the patient.  She again presented for follow up with Dr. Cyndia Bent on 05/27/2016 at which time he felt surgical repair would be indicated.  He was going to review the patient's case with Dr. Trula Slade to determine if she would benefit from a stent graft procedure with  debranching of the arch vessel vs. Conventional open replacement.  It was ultimately decided to undergo conventional open surgical repair.  The risks and benefits of the procedure were explained to the patient and she was agreeable to proceed.    Hospital Course:    She presented to [facility] on 06/29/2016.  She underwent Redo Sternotomy, right axillary artery cannulation, replacement of aortic arch with aorto-innominate and aorto-left carotid bypass using a 28 x 10 x 8 x 8 x 10 mm Gelweave graft under extracorporeal circulation.  She tolerated the procedure without difficulty and was taken to the SICU in stable condition.  She was extubated the evening of surgery.  During her stay in the SICU the patient was weaned off Neo-synephrine as BP allowed.  Her chest tubes and arterial lines were removed without difficulty.  She was maintaining NSR.  She complained of a lot of pain.  She uses narcotics chronically with her history of fibromyalgia and DJD.  She was transitioned to oral Dilaudid which provided relief.  She developed complaints of thrush and was started on Diflucan.  She was felt medically stable for transfer the telemetry unit.  The patient continues to make progress.  She is maintaining NSR.  She is hypotensive and has some bradycardia at times and will not be placed on a BB.  Her pacing wires were removed  without difficulty.  Her pain is under better control with use of Dilaudid.  She will be provided with a short course at discharge.  She continues to ambulate without much difficulty.  Her surgical incisions are healing without evidence of infection.  She is felt medically stable for discharge home today.        Significant Diagnostic Studies: radiology:   CT scan: 1. Progressive enlargement of what is now a 5.6 cm aneurysm of the distal ascending thoracic aorta, with additional aneurysmal dilatation of the aortic arch, as detailed above. On 10/07/2015 this aneurysm measured only 5.2  cm in diameter.   Treatments: surgery:   1. Median Sternotomy 2. Right axillary artery cannulation using an 8 mm Hemashield graft. 3.   Extracorporeal circulation via the right axillary artery and right atrium 4.   Replacement of aortic arch with aorto-innominate and aorto-left carotid bypass using a 28 x 10 x 8 x 8 x 10 mm Gelweave graft.   Disposition: 01-Home or Self Care   Discharge Medications:  The patient has been discharged on:   1.Beta Blocker:  Yes [   ]                              No   [ x  ]                              If No, reason: Bradycardia, Hypotension  2.Ace Inhibitor/ARB: Yes [   ]                                     No  [ x   ]                                     If No, reason: No CAD, Labile BP 3.Statin:   Yes [   ]                  No  [ x  ]                  If No, reason: No CAD  4.[place]:  Yes  [ x  ]                  No   [   ]                  If No, reason:     Discharge Instructions    Discharge patient    Complete by:  As directed    Discharge disposition:  01-Home or Self Care   Discharge patient date:  07/04/2016     Allergies as of 07/04/2016      Reactions   Acetaminophen    Eyes dilate; pt states "can't see"   Codeine Other (See Comments)   Blood pressure drops; "I pass out."    Pramipexole Dihydrochloride Nausea Only   sick, falling   Rofecoxib Other (See Comments)   "sleep walks"   Venlafaxine Other (See Comments)   migraines   Arava [leflunomide] Swelling   Clonazepam    Unknown; pt can't remember   Diclofenac Sodium    Unknown; pt can't remember   Alprazolam Other (  See Comments)   "Makes me mean"   Darvocet [propoxyphene N-acetaminophen] Other (See Comments)   "makes my eyes dilate. Pt stated I can't see"   Duloxetine Other (See Comments)   achy legs   Gabapentin Other (See Comments)   headache   Latex Itching, Dermatitis   When examined with latex gloves, burns and itches in contact areas per pt.   Morphine And  Related Other (See Comments)   Hallucinations   Nortriptyline Hcl Other (See Comments)   Migraines   Penicillins Rash   Has patient had a PCN reaction causing immediate rash, facial/tongue/throat swelling, SOB or lightheadedness with hypotension: Yes Has patient had a PCN reaction causing severe rash involving mucus membranes or skin necrosis: Yes Has patient had a PCN reaction that required hospitalization No Has patient had a PCN reaction occurring within the last 10 years: Yes If all of the above answers are "NO", then may proceed with Cephalosporin use. Can take Cephalosporins   Prednisone Swelling   Just doesn't want to take it.      Medication List    STOP taking these medications   ALEVE-D SINUS & COLD PO   LEVSIN PO   oxyCODONE 10 mg 12 hr tablet Commonly known as:  OXYCONTIN   VAGIFEM 10 MCG Tabs vaginal tablet Generic drug:  Estradiol   ZITHROMAX Z-PAK 250 MG tablet Generic drug:  azithromycin     TAKE these medications   aspirin EC 81 MG tablet Take 81 mg by mouth at bedtime.   Cyanocobalamin 1000 MCG Subl Place 1 tablet (1,000 mcg total) under the tongue daily.   HYDROmorphone 2 MG tablet Commonly known as:  DILAUDID Take 1 tablet (2 mg total) by mouth every 6 (six) hours as needed for severe pain.   rOPINIRole 2 MG tablet Commonly known as:  REQUIP TAKE ONE TABLET BY MOUTH THREE TIMES DAILY   Vitamin D3 2000 units capsule Take 1 capsule (2,000 Units total) by mouth daily.      Follow-up Information    Gaye Pollack, MD Follow up on 07/29/2016.   Specialty:  Cardiothoracic Surgery Why:  Appointment is at 3:30, please get CXR at 3:00 at [place] located on first floor of our office building Contact information: [place] [address] [phone]        [facility] Follow up on 07/10/2016.   Specialty:  Cardiothoracic Surgery Why:  Appointment is at 10:00, for  suture removal Contact information: [address] [place], [place] [phone]          Signed: John Giovanni 07/04/2016, 10:24 AM

## 2016-07-03 NOTE — Progress Notes (Signed)
CARDIAC REHAB PHASE I   PRE:  Rate/Rhythm: 79 SR  BP:  Supine:   Sitting: 106/60  Standing:    SaO2: 93%RA  MODE:  Ambulation: 550 ft   POST:  Rate/Rhythm: 103 ST  BP:  Supine:   Sitting: 103/63  Standing:    SaO2: 95-96%RA 1030-1130 Pt walked 550 ft on RA with rolling walker and asst x 1 with steady gait. Tolerated well. Second walk today. To recliner after walk. Did well on RA. Discussed sternal precautions, IS and gave heart healthy diet sheet. Pt asked about CRP 2 but she is not sure who will be her cardiologist. Also pt or family will need to check with insurance re coverage since replacement of aortic arch. Need cardiologist for referral or referral by surgeon. Will follow up tomorrow.   [name], RN BSN  07/03/2016 11:25 AM

## 2016-07-03 NOTE — Progress Notes (Addendum)
[address]       [place] [address]             [phone]      4 Days Post-Op Procedure(s) (LRB): REDO STERNOTOMY (N/A) AORTIC ARCH REPLACEMENT WITH CIRC ARREST (N/A) - RIGHT AXILLARY CANNULATION  AORTO-AORTIC 28 HEMASHIELD GRAFT WITH AORTO-INNOMINATE AND AORTO-LEFT CAROTID ANASTAMOSIS (N/A) TRANSESOPHAGEAL ECHOCARDIOGRAM (TEE) (N/A) Subjective: conts to feel stronger/better  Objective: Vital signs in last 24 hours: Temp:  [97.5 F (36.4 C)-98.4 F (36.9 C)] 97.8 F (36.6 C) (03/02 0400) Pulse Rate:  [71-86] 75 (03/02 0800) Cardiac Rhythm: Atrial paced (03/02 0800) Resp:  [11-24] 18 (03/02 0800) BP: (84-133)/(49-86) 105/64 (03/02 0900) SpO2:  [90 %-98 %] 90 % (03/02 0800)  Hemodynamic parameters for last 24 hours:    Intake/Output from previous day: 03/01 0701 - 03/02 0700 In: 840 [P.O.:840] Out: 1200 [Urine:1200] Intake/Output this shift: No intake/output data recorded.  General appearance: alert, cooperative and no distress Heart: regular rate and rhythm Lungs: clear to auscultation bilaterally Abdomen: benign Extremities: no edema Wound: incis healing well  Lab Results:  Recent Labs  06/30/16 1627 07/01/16 0426  WBC 9.0 6.6  HGB 10.9* 10.1*  HCT 32.5* 31.0*  PLT 169 136*   BMET:  Recent Labs  06/30/16 1614 06/30/16 1627 07/01/16 0426  NA 137  --  137  K 4.5  --  3.9  CL 98*  --  100*  CO2  --   --  30  GLUCOSE 108*  --  99  BUN 25*  --  21*  CREATININE 1.40* 1.32* 1.27*  CALCIUM  --   --  8.6*    PT/INR: No results for input(s): LABPROT, INR in the last 72 hours. ABG    Component Value Date/Time   PHART 7.342 (L) 06/29/2016 2213   HCO3 22.7 06/29/2016 2213   TCO2 28 06/30/2016 1614   ACIDBASEDEF 3.0 (H) 06/29/2016 2213   O2SAT 97.0 06/29/2016 2213   CBG (last 3)   Recent Labs  07/01/16 0400 07/01/16 0829 07/01/16 1144  GLUCAP 106* 101* 139*    Meds Scheduled Meds: . aspirin EC  81 mg Oral Daily    . Chlorhexidine Gluconate Cloth  6 each Topical Q0600  . docusate sodium  200 mg Oral Daily  . enoxaparin (LOVENOX) injection  40 mg Subcutaneous QHS  . famotidine  20 mg Oral BID  . fluconazole  200 mg Oral Daily  . mouth rinse  15 mL Mouth Rinse BID  . mupirocin ointment  1 application Nasal BID  . rOPINIRole  2 mg Oral TID  . sodium chloride flush  3 mL Intravenous Q12H   Continuous Infusions: PRN Meds:.sodium chloride, bisacodyl **OR** bisacodyl, HYDROmorphone, ondansetron **OR** ondansetron (ZOFRAN) IV, sodium chloride flush  Xrays No results found.  Assessment/Plan: S/P Procedure(s) (LRB): REDO STERNOTOMY (N/A) AORTIC ARCH REPLACEMENT WITH CIRC ARREST (N/A) - RIGHT AXILLARY CANNULATION  AORTO-AORTIC 28 HEMASHIELD GRAFT WITH AORTO-INNOMINATE AND AORTO-LEFT CAROTID ANASTAMOSIS (N/A) TRANSESOPHAGEAL ECHOCARDIOGRAM (TEE) (N/A)  1 doing well 2 no beta blocker as runs low BP at home and had some bradycardia. D/C epw's today 3 wean O2 off 4 no new labs 5 push rehab 6 home 1-2 days  LOS: 4 days    GOLD,WAYNE E 07/03/2016   Chart reviewed, patient examined, agree with above. She looks and feels better. Pain is controlled, sleeping well. Bowels working. If she weans off oxygen today she can go home tomorrow. I would  sent her home on dilaudid for pain since it is working well here.

## 2016-07-04 MED ORDER — HYDROMORPHONE HCL 2 MG PO TABS: 2.0000 mg | ORAL_TABLET | Freq: Four times a day (QID) | ORAL | 0 refills | Status: DC | PRN

## 2016-07-04 NOTE — Progress Notes (Signed)
Patient in a stable condition, discharge education reviewed with patient, she verbalised understanding, paper prescription given to patient , patient belongings at  Bedside, patient awaiting her son to come pick her up, will continue to monitor

## 2016-07-04 NOTE — Progress Notes (Signed)
CARDIAC REHAB PHASE I   PRE:  Rate/Rhythm: 74 SR  BP:  Sitting: 126/73       SaO2: 96 RA  MODE:  Ambulation: 550 ft   POST:  Rate/Rhythm: 99 SR  BP:  Sitting: 110/64        SaO2: 98 RA  Pt ambulated 550 ft using RW and tolerated very well.  Returned to chair after walk and provided pt with ice, ginger ale and call bell within reach.  Will continue to follow  Meta Hatchet, MS ACSM RCEP 07/04/2016 11:41 AM

## 2016-07-04 NOTE — Progress Notes (Signed)
Iv removed, tele dc ccmd notified.  

## 2016-07-04 NOTE — Progress Notes (Addendum)
      [address]       [place] [address]             [phone]      5 Days Post-Op Procedure(s) (LRB): REDO STERNOTOMY (N/A) AORTIC ARCH REPLACEMENT WITH CIRC ARREST (N/A) - RIGHT AXILLARY CANNULATION  AORTO-AORTIC 28 HEMASHIELD GRAFT WITH AORTO-INNOMINATE AND AORTO-LEFT CAROTID ANASTAMOSIS (N/A) TRANSESOPHAGEAL ECHOCARDIOGRAM (TEE) (N/A) Subjective: Some soreness, but overall conts to feel better  Objective: Vital signs in last 24 hours: Temp:  [98 F (36.7 C)-99 F (37.2 C)] 98.9 F (37.2 C) (03/03 0936) Pulse Rate:  [72-81] 77 (03/03 0936) Cardiac Rhythm: Normal sinus rhythm (03/03 0700) Resp:  [12-18] 18 (03/03 0936) BP: (87-117)/(50-71) 111/56 (03/03 0936) SpO2:  [91 %-95 %] 91 % (03/03 0936) Weight:  [137 lb 11.2 oz (62.5 kg)] 137 lb 11.2 oz (62.5 kg) (03/03 0536)  Hemodynamic parameters for last 24 hours:    Intake/Output from previous day: 03/02 0701 - 03/03 0700 In: 720 [P.O.:720] Out: -  Intake/Output this shift: No intake/output data recorded.  General appearance: alert, cooperative and no distress Heart: regular rate and rhythm Lungs: clear to auscultation bilaterally Abdomen: benign Extremities: minor edema Wound: incis healing well  Lab Results: No results for input(s): WBC, HGB, HCT, PLT in the last 72 hours. BMET: No results for input(s): NA, K, CL, CO2, GLUCOSE, BUN, CREATININE, CALCIUM in the last 72 hours.  PT/INR: No results for input(s): LABPROT, INR in the last 72 hours. ABG    Component Value Date/Time   PHART 7.342 (L) 06/29/2016 2213   HCO3 22.7 06/29/2016 2213   TCO2 28 06/30/2016 1614   ACIDBASEDEF 3.0 (H) 06/29/2016 2213   O2SAT 97.0 06/29/2016 2213   CBG (last 3)   Recent Labs  07/01/16 1144  GLUCAP 139*    Meds Scheduled Meds: . aspirin EC  81 mg Oral Daily  . docusate sodium  200 mg Oral Daily  . enoxaparin (LOVENOX) injection  40 mg Subcutaneous QHS  . famotidine  20 mg Oral BID  .  fluconazole  200 mg Oral Daily  . mouth rinse  15 mL Mouth Rinse BID  . mupirocin ointment  1 application Nasal BID  . rOPINIRole  2 mg Oral TID  . sodium chloride flush  3 mL Intravenous Q12H   Continuous Infusions: PRN Meds:.sodium chloride, bisacodyl **OR** bisacodyl, HYDROmorphone, ondansetron **OR** ondansetron (ZOFRAN) IV, sodium chloride flush  Xrays No results found.  Assessment/Plan: S/P Procedure(s) (LRB): REDO STERNOTOMY (N/A) AORTIC ARCH REPLACEMENT WITH CIRC ARREST (N/A) - RIGHT AXILLARY CANNULATION  AORTO-AORTIC 28 HEMASHIELD GRAFT WITH AORTO-INNOMINATE AND AORTO-LEFT CAROTID ANASTAMOSIS (N/A) TRANSESOPHAGEAL ECHOCARDIOGRAM (TEE) (N/A)   1 doing well, appears stable for discharge   LOS: 5 days    Diane Abbott,Diane Abbott 07/04/2016 Agree with plan for discharge patient examined and medical record reviewed,agree with above note. Diane Abbott 07/04/2016

## 2016-07-04 NOTE — Progress Notes (Signed)
Pt O2 sat 87% on room air while asleep.  2L Melissa applied, saturation now 96%.  RN will continue to monitor.  Claudette Stapler, RN

## 2016-07-07 ENCOUNTER — Telehealth (HOSPITAL_COMMUNITY): Payer: Self-pay | Admitting: *Deleted

## 2016-07-07 ENCOUNTER — Telehealth: Payer: Self-pay | Admitting: Surgical

## 2016-07-07 NOTE — Telephone Encounter (Signed)
      [address]       [place] [address]             [phone]    GAL CHMIEL [id number]   S/P :  02/15/2014  Surgeon:  Gaye Pollack, MD  First Assistant: Jadene Pierini,  PA-C   Preoperative Diagnosis:  Aortic arch aneurysm  Postoperative Diagnosis:  Same   Procedure:  1. Median Sternotomy 2. Right axillary artery cannulation using an 8 mm Hemashield graft. 3.   Extracorporeal circulation via the right axillary artery and right atrium 4.   Replacement of aortic arch with aorto-innominate and aorto-left carotid bypass using a 28 x 10 x 8 x 8 x 10 mm Gelweave graft.   Anesthesia:  General Endotracheal  Anesthesiologist: Dr. Laurie Panda  Discharged home on 07/04/16  Medications: Allergies as of 07/07/2016      Reactions   Acetaminophen    Eyes dilate; pt states "can't see"   Codeine Other (See Comments)   Blood pressure drops; "I pass out."    Pramipexole Dihydrochloride Nausea Only   sick, falling   Rofecoxib Other (See Comments)   "sleep walks"   Venlafaxine Other (See Comments)   migraines   Arava [leflunomide] Swelling   Clonazepam    Unknown; pt can't remember   Diclofenac Sodium    Unknown; pt can't remember   Alprazolam Other (See Comments)   "Makes me mean"   Darvocet [propoxyphene N-acetaminophen] Other (See Comments)   "makes my eyes dilate. Pt stated I can't see"   Duloxetine Other (See Comments)   achy legs   Gabapentin Other (See Comments)   headache   Latex Itching, Dermatitis   When examined with latex gloves, burns and itches in contact areas per pt.   Morphine And Related Other (See Comments)   Hallucinations   Nortriptyline Hcl Other (See Comments)   Migraines   Penicillins Rash   Has patient had a PCN reaction causing immediate rash, facial/tongue/throat swelling, SOB or lightheadedness with hypotension: Yes Has patient had a PCN reaction causing severe rash involving mucus membranes or skin  necrosis: Yes Has patient had a PCN reaction that required hospitalization No Has patient had a PCN reaction occurring within the last 10 years: Yes If all of the above answers are "NO", then may proceed with Cephalosporin use. Can take Cephalosporins   Prednisone Swelling   Just doesn't want to take it.      Medication List       Accurate as of 07/07/16 10:11 AM. Always use your most recent med list.          aspirin EC 81 MG tablet Take 81 mg by mouth at bedtime.   Cyanocobalamin 1000 MCG Subl Place 1 tablet (1,000 mcg total) under the tongue daily.   HYDROmorphone 2 MG tablet Commonly known as:  DILAUDID Take 1 tablet (2 mg total) by mouth every 6 (six) hours as needed for severe pain.   rOPINIRole 2 MG tablet Commonly known as:  REQUIP TAKE ONE TABLET BY MOUTH THREE TIMES DAILY   Vitamin D3 2000 units capsule Take 1 capsule (2,000 Units total) by mouth daily.       Coumadin: No  Problems/Concerns: doing well without new concers  Assessment:  Patient is doing well   Further instructions provided.  Contact office if concerns or problems develop  Follow up Appointment: 07/29/16 BKB and suture removal by the nurse

## 2016-07-07 NOTE — Telephone Encounter (Signed)
Received notice of pt desire to participate in cardiac rehab.  Called and spoke to pt. Pt did not have a reimburseable by medicare diagnosis - aortic arch replacement to participate in phase II. Offered maintenance non telemetry medically supervised exercise program that is self pay.  Pt agreeable to this.  Pt contact information given to [employer], Warehouse manager for maintenance program. Maurice Small RN, BSN

## 2016-07-10 ENCOUNTER — Ambulatory Visit (INDEPENDENT_AMBULATORY_CARE_PROVIDER_SITE_OTHER): Payer: Self-pay | Admitting: *Deleted

## 2016-07-10 ENCOUNTER — Other Ambulatory Visit: Payer: Self-pay | Admitting: *Deleted

## 2016-07-10 DIAGNOSIS — I712 Thoracic aortic aneurysm, without rupture: Secondary | ICD-10-CM

## 2016-07-10 DIAGNOSIS — Z4802 Encounter for removal of sutures: Secondary | ICD-10-CM

## 2016-07-10 DIAGNOSIS — Z95828 Presence of other vascular implants and grafts: Secondary | ICD-10-CM

## 2016-07-10 DIAGNOSIS — I7122 Aneurysm of the aortic arch, without rupture: Secondary | ICD-10-CM

## 2016-07-10 DIAGNOSIS — Z952 Presence of prosthetic heart valve: Secondary | ICD-10-CM

## 2016-07-10 DIAGNOSIS — B370 Candidal stomatitis: Secondary | ICD-10-CM

## 2016-07-10 MED ORDER — FLUCONAZOLE 100 MG PO TABS: 100.0000 mg | ORAL_TABLET | Freq: Every day | ORAL | 0 refills | Status: DC

## 2016-07-10 NOTE — Progress Notes (Signed)
Diane Abbott returns s/p REPAIR Of AORTIC ARCH ANEURYSM on 06/29/16. She is doing very well. Diet and bowels are good. Her only complaint is oral thrush, which is quite evident on exam. She said she was given Diflucan in the hospital, but none on discharge. She says Magic Mouthwash is not effective for her. Her incisions are well healed. I removed the three sutures from her chest tube sites very easily. Diflucan will be e-prescribed to her pharmacy. She takes an occasional Dilaudid for pain. She will return as scheduled with a CXR.

## 2016-07-24 ENCOUNTER — Other Ambulatory Visit: Payer: Self-pay | Admitting: Surgery

## 2016-07-24 DIAGNOSIS — I712 Thoracic aortic aneurysm, without rupture, unspecified: Secondary | ICD-10-CM

## 2016-07-29 ENCOUNTER — Ambulatory Visit
Admission: RE | Admit: 2016-07-29 | Discharge: 2016-07-29 | Disposition: A | Discharge status: Home / Self Care | Payer: Medicare HMO | Source: Ambulatory Visit | Attending: Surgery | Admitting: Surgery

## 2016-07-29 ENCOUNTER — Ambulatory Visit (INDEPENDENT_AMBULATORY_CARE_PROVIDER_SITE_OTHER): Payer: Self-pay | Admitting: Surgery

## 2016-07-29 ENCOUNTER — Encounter: Payer: Self-pay | Admitting: Surgery

## 2016-07-29 VITALS — BP 120/70 | HR 99 | Resp 16 | Ht 60.0 in | Wt 132.2 lb

## 2016-07-29 DIAGNOSIS — R0602 Shortness of breath: Secondary | ICD-10-CM | POA: Diagnosis not present

## 2016-07-29 DIAGNOSIS — Z09 Encounter for follow-up examination after completed treatment for conditions other than malignant neoplasm: Secondary | ICD-10-CM

## 2016-07-29 DIAGNOSIS — I712 Thoracic aortic aneurysm, without rupture, unspecified: Secondary | ICD-10-CM

## 2016-07-29 DIAGNOSIS — I7122 Aneurysm of the aortic arch, without rupture: Secondary | ICD-10-CM

## 2016-07-29 MED ORDER — FLUCONAZOLE 200 MG PO TABS: 200.0000 mg | ORAL_TABLET | Freq: Every day | ORAL | 1 refills | Status: DC

## 2016-07-30 ENCOUNTER — Encounter: Payer: Self-pay | Admitting: Surgery

## 2016-07-30 NOTE — Progress Notes (Signed)
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 The patient's early postoperative recovery while in the hospital was notable for an uncomplicated postop course. She did develop some early symptoms of oral thrush and was treated with Diflucan for about a week. Since hospital discharge the patient reports that she did well for a while but then developed worsening symptoms of oropharyngeal and esophageal candida with difficulty swallowing. She had this exact same problem after her initial aneurysm surgery in 2014. I had to treat her with Diflucan for about two months to resolve this and it did not recur until now after periop antibiotics. She says that she has not been eating much. She can take her meds with applesauce. It feels like something is constantly in her throat and she has to spit out her saliva constantly. She has had no chest pain or shortness of breath.   Current Outpatient Prescriptions  Medication Sig Dispense Refill  . aspirin EC 81 MG tablet Take 81 mg by mouth at bedtime.     . Cholecalciferol (VITAMIN D3) 2000 units capsule Take 1 capsule (2,000 Units total) by mouth daily. 100 capsule 3  . Cyanocobalamin 1000 MCG SUBL Place 1 tablet (1,000 mcg total) under the tongue daily. 100 tablet 11  . meclizine (ANTIVERT) 12.5 MG tablet Take 12.5 mg by mouth 3 (three) times daily as needed for dizziness.    Marland Kitchen oxyCODONE (OXYCONTIN) 10 mg 12 hr tablet Take 10 mg by mouth daily.    Marland Kitchen rOPINIRole (REQUIP) 2 MG tablet TAKE ONE TABLET BY MOUTH THREE TIMES DAILY 90 tablet 5  . fluconazole (DIFLUCAN) 200 MG tablet Take 1 tablet (200 mg total) by mouth daily. 30 tablet 1   No current facility-administered medications for this visit.     Physical Exam: BP 120/70 (BP Location: Right Arm, Patient Position: Sitting, Cuff Size: Normal)   Pulse 99   Resp 16    Ht 5' (1.524 m)   Wt 132 lb 3.2 oz (60 kg)   SpO2 97% Comment: ON RA  BMI 25.82 kg/m  She looks weak but in no distress. She is here with her son today. There is some yeast on tongue. She is spitting out her saliva into a tissue. Lung exam is clear. Cardiac exam shows a regular rate and rhythm with normal heart sounds. Chest incision is healing well and sternum is stable. The right subclavicular incision is healing well There is no peripheral edema   Diagnostic Tests:  Study Result   CLINICAL DATA:  Post aortic arch replacement in 06/2016, some shortness of breath  EXAM: CHEST  2 VIEW  COMPARISON:  07/01/2016  FINDINGS: Normal heart size post median sternotomy and AVR.  Calcified tortuous aorta.  Mediastinal contours and pulmonary vascularity otherwise normal.  Emphysematous changes without infiltrate, pleural effusion, or pneumothorax.  Improved aeration in LEFT lung since prior exam.  Osseous demineralization with chronic compression deformity of a vertebra at the thoracolumbar junction.  LEFT shoulder prosthesis and RIGHT glenohumeral degenerative changes.  IMPRESSION: Postsurgical and emphysematous changes without acute infiltrate.   Electronically Signed   By: Lavonia Dana M.D.   On: 07/29/2016 15:25      Impression:  Overall I think she is doing well after surgery except for her dysphagia symptoms. She does have recurrent symptoms of oropharyngeal and esophageal candida infection and I am going to put her on Diflucan  200 mg daily for two months.  I encouraged her to continue walking. She is planning to participate in cardiac rehab. I told her that she could drive a car but should not lift anything heavier than 10 lbs for three months postop.   Plan:  I will see her back in three weeks to follow up on the presumed candida infection.   Gaye Pollack, MD [facility] [phone]

## 2016-08-04 ENCOUNTER — Encounter (HOSPITAL_COMMUNITY): Payer: Self-pay | Admitting: Surgery

## 2016-08-19 ENCOUNTER — Encounter: Payer: Self-pay | Admitting: Surgery

## 2016-08-19 ENCOUNTER — Ambulatory Visit (INDEPENDENT_AMBULATORY_CARE_PROVIDER_SITE_OTHER): Payer: Self-pay | Admitting: Surgery

## 2016-08-19 VITALS — BP 133/79 | HR 91 | Resp 16 | Ht 62.0 in | Wt 131.6 lb

## 2016-08-19 DIAGNOSIS — B370 Candidal stomatitis: Secondary | ICD-10-CM

## 2016-08-19 DIAGNOSIS — Z95828 Presence of other vascular implants and grafts: Secondary | ICD-10-CM

## 2016-08-19 DIAGNOSIS — I712 Thoracic aortic aneurysm, without rupture: Secondary | ICD-10-CM

## 2016-08-19 DIAGNOSIS — I7122 Aneurysm of the aortic arch, without rupture: Secondary | ICD-10-CM

## 2016-08-20 ENCOUNTER — Encounter: Payer: Self-pay | Admitting: Surgery

## 2016-08-20 NOTE — Progress Notes (Signed)
     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 She has been on diflucan for the past month and says that the her symptoms are improving but not resolved. This has been a long term recurrent problem for her after any antibiotic. She is feeling well otherwise and is eating. Her weight has been stable.   Current Outpatient Prescriptions  Medication Sig Dispense Refill  . aspirin EC 81 MG tablet Take 81 mg by mouth at bedtime.     . Cholecalciferol (VITAMIN D3) 2000 units capsule Take 1 capsule (2,000 Units total) by mouth daily. 100 capsule 3  . Cyanocobalamin 1000 MCG SUBL Place 1 tablet (1,000 mcg total) under the tongue daily. 100 tablet 11  . fluconazole (DIFLUCAN) 200 MG tablet Take 1 tablet (200 mg total) by mouth daily. 30 tablet 1  . meclizine (ANTIVERT) 12.5 MG tablet Take 12.5 mg by mouth 3 (three) times daily as needed for dizziness.    Marland Kitchen oxyCODONE (OXYCONTIN) 10 mg 12 hr tablet Take 10 mg by mouth daily.    Marland Kitchen rOPINIRole (REQUIP) 2 MG tablet TAKE ONE TABLET BY MOUTH THREE TIMES DAILY 90 tablet 5   No current facility-administered medications for this visit.      Physical Exam: BP 133/79 (BP Location: Left Arm, Patient Position: Sitting, Cuff Size: Normal)   Pulse 91   Resp 16   Ht 5\' 2"  (1.575 m)   Wt 131 lb 9.6 oz (59.7 kg)   SpO2 98% Comment: ON RA  BMI 24.07 kg/m  She looks stronger and is smiling some.  There is no yeast on tongue. She is not spitting out her saliva into a tissue like she was on the last visit. Lung exam is clear. Cardiac exam shows a regular rate and rhythm with normal heart sounds. Chest incision is healed and sternum is stable. The right subclavicular incision is healed.  There is no peripheral edema  Diagnostic Tests:  None today  Impression:  She is doing well  overall considering the magnitude of her surgery at her age. Her dysphagia is improving and given her history of recurrent candida esophagitis and thrush I have to think that this is the same thing since her recent symptoms are the same that she has had in the past. She will continue the diflucan for another month and I would expect this to be resolved by then. If it isn't then she should see a gastroenterologist.   Plan:  She will continue diflucan and I will see her back in one month for follow up.   Gaye Pollack, MD [facility] [phone]

## 2016-08-26 ENCOUNTER — Encounter: Payer: Self-pay | Admitting: Internal Medicine

## 2016-08-26 ENCOUNTER — Telehealth: Payer: Self-pay | Admitting: Internal Medicine

## 2016-08-26 ENCOUNTER — Other Ambulatory Visit (INDEPENDENT_AMBULATORY_CARE_PROVIDER_SITE_OTHER): Payer: Medicare HMO

## 2016-08-26 ENCOUNTER — Ambulatory Visit (INDEPENDENT_AMBULATORY_CARE_PROVIDER_SITE_OTHER): Payer: Medicare HMO | Admitting: Internal Medicine

## 2016-08-26 DIAGNOSIS — M79672 Pain in left foot: Secondary | ICD-10-CM

## 2016-08-26 DIAGNOSIS — M4626 Osteomyelitis of vertebra, lumbar region: Secondary | ICD-10-CM

## 2016-08-26 DIAGNOSIS — I712 Thoracic aortic aneurysm, without rupture, unspecified: Secondary | ICD-10-CM

## 2016-08-26 DIAGNOSIS — G062 Extradural and subdural abscess, unspecified: Secondary | ICD-10-CM | POA: Diagnosis not present

## 2016-08-26 DIAGNOSIS — E559 Vitamin D deficiency, unspecified: Secondary | ICD-10-CM

## 2016-08-26 DIAGNOSIS — I7121 Aneurysm of the ascending aorta, without rupture: Secondary | ICD-10-CM

## 2016-08-26 DIAGNOSIS — E538 Deficiency of other specified B group vitamins: Secondary | ICD-10-CM

## 2016-08-26 DIAGNOSIS — B370 Candidal stomatitis: Secondary | ICD-10-CM | POA: Diagnosis not present

## 2016-08-26 DIAGNOSIS — M069 Rheumatoid arthritis, unspecified: Secondary | ICD-10-CM

## 2016-08-26 HISTORY — DX: Pain in left foot: M79.672

## 2016-08-26 LAB — CBC WITH DIFFERENTIAL/PLATELET
BASOS ABS: 0.1 10*3/uL (ref 0.0–0.1)
Basophils Relative: 1.2 % (ref 0.0–3.0)
EOS PCT: 3.3 % (ref 0.0–5.0)
Eosinophils Absolute: 0.2 10*3/uL (ref 0.0–0.7)
HEMATOCRIT: 31.9 % — AB (ref 36.0–46.0)
Hemoglobin: 10.5 g/dL — ABNORMAL LOW (ref 12.0–15.0)
LYMPHS PCT: 24.8 % (ref 12.0–46.0)
Lymphs Abs: 1.3 10*3/uL (ref 0.7–4.0)
MCHC: 32.9 g/dL (ref 30.0–36.0)
MCV: 89.4 fl (ref 78.0–100.0)
MONOS PCT: 10.1 % (ref 3.0–12.0)
Monocytes Absolute: 0.5 10*3/uL (ref 0.1–1.0)
NEUTROS ABS: 3.1 10*3/uL (ref 1.4–7.7)
Neutrophils Relative %: 60.6 % (ref 43.0–77.0)
PLATELETS: 195 10*3/uL (ref 150.0–400.0)
RBC: 3.57 Mil/uL — AB (ref 3.87–5.11)
RDW: 15.9 % — ABNORMAL HIGH (ref 11.5–15.5)
WBC: 5.1 10*3/uL (ref 4.0–10.5)

## 2016-08-26 LAB — C-REACTIVE PROTEIN: CRP: 2.5 mg/dL (ref 0.5–20.0)

## 2016-08-26 LAB — VITAMIN B12: Vitamin B-12: 1421 pg/mL — ABNORMAL HIGH (ref 211–911)

## 2016-08-26 LAB — BASIC METABOLIC PANEL
BUN: 31 mg/dL — AB (ref 6–23)
CHLORIDE: 104 meq/L (ref 96–112)
CO2: 28 meq/L (ref 19–32)
CREATININE: 1.41 mg/dL — AB (ref 0.40–1.20)
Calcium: 9.8 mg/dL (ref 8.4–10.5)
GFR: 38.12 mL/min — ABNORMAL LOW (ref >60.00)
Glucose, Bld: 102 mg/dL — ABNORMAL HIGH (ref 70–99)
Potassium: 4.9 mEq/L (ref 3.5–5.1)
Sodium: 138 mEq/L (ref 135–145)

## 2016-08-26 LAB — HEPATIC FUNCTION PANEL
ALBUMIN: 3.7 g/dL (ref 3.5–5.2)
ALK PHOS: 71 U/L (ref 39–117)
ALT: 6 U/L (ref 0–35)
AST: 12 U/L (ref 0–37)
Bilirubin, Direct: 0.1 mg/dL (ref 0.0–0.3)
TOTAL PROTEIN: 7.6 g/dL (ref 6.0–8.3)
Total Bilirubin: 0.3 mg/dL (ref 0.2–1.2)

## 2016-08-26 LAB — SEDIMENTATION RATE: Sed Rate: 63 mm/hr — ABNORMAL HIGH (ref 0–30)

## 2016-08-26 LAB — URIC ACID: Uric Acid, Serum: 6.9 mg/dL (ref 2.4–7.0)

## 2016-08-26 MED ORDER — OXYCODONE HCL ER 10 MG PO T12A: 10.0000 mg | EXTENDED_RELEASE_TABLET | Freq: Two times a day (BID) | ORAL | 0 refills | Status: DC

## 2016-08-26 MED ORDER — COLCHICINE 0.6 MG PO TABS: 0.6000 mg | ORAL_TABLET | Freq: Every day | ORAL | 1 refills | Status: DC | PRN

## 2016-08-26 MED ORDER — COLCHICINE 0.6 MG PO TABS
0.6000 mg | ORAL_TABLET | Freq: Two times a day (BID) | ORAL | 1 refills | Status: DC | PRN
Start: 2016-08-26 — End: 2016-08-26

## 2016-08-26 NOTE — Assessment & Plan Note (Signed)
2/18 1st MTP - gout vs other Labs X ray Appt w/Dr Estanislado Pandy

## 2016-08-26 NOTE — Telephone Encounter (Signed)
Please advise 

## 2016-08-26 NOTE — Assessment & Plan Note (Signed)
s/p replacement of her aneurysmal aortic arch with aorto-innominate and aorto-left carotid artery bypasson 06/29/2016

## 2016-08-26 NOTE — Assessment & Plan Note (Signed)
Treated ESR

## 2016-08-26 NOTE — Progress Notes (Signed)
Pre visit review using our clinic review tool, if applicable. No additional management support is needed unless otherwise documented below in the visit note. 

## 2016-08-26 NOTE — Progress Notes (Signed)
Subjective:  Patient ID: Diane Abbott, female    DOB: [birth year]  Age: 82 y.o. MRN: [id number]  CC: No chief complaint on file.   HPI ROME SCHLAUCH presents for chronic pain, B12 def,  s/p replacement of her aneurysmal aortic arch with aorto-innominate and aorto-left carotid artery bypasson 06/29/2016 C/o L 1st MTP redness and swelling since Feb 2018. C/o LBP A complex case  Outpatient Medications Prior to Visit  Medication Sig Dispense Refill  . aspirin EC 81 MG tablet Take 81 mg by mouth at bedtime.     . Cholecalciferol (VITAMIN D3) 2000 units capsule Take 1 capsule (2,000 Units total) by mouth daily. 100 capsule 3  . Cyanocobalamin 1000 MCG SUBL Place 1 tablet (1,000 mcg total) under the tongue daily. 100 tablet 11  . fluconazole (DIFLUCAN) 200 MG tablet Take 1 tablet (200 mg total) by mouth daily. 30 tablet 1  . meclizine (ANTIVERT) 12.5 MG tablet Take 12.5 mg by mouth 3 (three) times daily as needed for dizziness.    Marland Kitchen oxyCODONE (OXYCONTIN) 10 mg 12 hr tablet Take 10 mg by mouth daily.    Marland Kitchen rOPINIRole (REQUIP) 2 MG tablet TAKE ONE TABLET BY MOUTH THREE TIMES DAILY 90 tablet 5   No facility-administered medications prior to visit.     ROS Review of Systems  Constitutional: Positive for fatigue and unexpected weight change. Negative for activity change, appetite change and chills.  HENT: Negative for congestion, mouth sores and sinus pressure.   Eyes: Negative for visual disturbance.  Respiratory: Negative for cough and chest tightness.   Gastrointestinal: Negative for abdominal pain and nausea.  Genitourinary: Negative for difficulty urinating, frequency and vaginal pain.  Musculoskeletal: Positive for arthralgias, back pain and gait problem.  Skin: Positive for color change. Negative for pallor and rash.  Neurological: Negative for dizziness, tremors, weakness, numbness and headaches.  Psychiatric/Behavioral: Negative for confusion and sleep disturbance.     Objective:  BP 126/64 (BP Location: Left Arm, Patient Position: Sitting, Cuff Size: Normal)   Pulse 79   Temp 97.8 F (36.6 C) (Oral)   Ht 5\' 2"  (1.575 m)   Wt 134 lb (60.8 kg)   SpO2 97%   BMI 24.51 kg/m   BP Readings from Last 3 Encounters:  08/26/16 126/64  08/19/16 133/79  07/29/16 120/70    Wt Readings from Last 3 Encounters:  08/26/16 134 lb (60.8 kg)  08/19/16 131 lb 9.6 oz (59.7 kg)  07/29/16 132 lb 3.2 oz (60 kg)    Physical Exam  Constitutional: She appears well-developed. No distress.  HENT:  Head: Normocephalic.  Right Ear: External ear normal.  Left Ear: External ear normal.  Nose: Nose normal.  Mouth/Throat: Oropharynx is clear and moist.  Eyes: Conjunctivae are normal. Pupils are equal, round, and reactive to light. Right eye exhibits no discharge. Left eye exhibits no discharge.  Neck: Normal range of motion. Neck supple. No JVD present. No tracheal deviation present. No thyromegaly present.  Cardiovascular: Normal rate, regular rhythm and normal heart sounds.   Pulmonary/Chest: No stridor. No respiratory distress. She has no wheezes.  Abdominal: Soft. Bowel sounds are normal. She exhibits no distension and no mass. There is no tenderness. There is no rebound and no guarding.  Musculoskeletal: She exhibits tenderness. She exhibits no edema.  Lymphadenopathy:    She has no cervical adenopathy.  Neurological: She displays normal reflexes. No cranial nerve deficit. She exhibits normal muscle tone. Coordination abnormal.  Skin: No rash noted. No  erythema.  Psychiatric: She has a normal mood and affect. Her behavior is normal. Judgment and thought content normal.    Lab Results  Component Value Date   WBC 6.6 07/01/2016   HGB 10.1 (L) 07/01/2016   HCT 31.0 (L) 07/01/2016   PLT 136 (L) 07/01/2016   GLUCOSE 99 07/01/2016   ALT 11 (L) 06/24/2016   AST 16 06/24/2016   NA 137 07/01/2016   K 3.9 07/01/2016   CL 100 (L) 07/01/2016   CREATININE 1.27  (H) 07/01/2016   BUN 21 (H) 07/01/2016   CO2 30 07/01/2016   TSH 2.88 01/09/2011   INR 1.48 06/29/2016   HGBA1C 5.5 06/24/2016    Dg Chest 2 View  Result Date: 07/29/2016 CLINICAL DATA:  Post aortic arch replacement in 06/2016, some shortness of breath EXAM: CHEST  2 VIEW COMPARISON:  07/01/2016 FINDINGS: Normal heart size post median sternotomy and AVR. Calcified tortuous aorta. Mediastinal contours and pulmonary vascularity otherwise normal. Emphysematous changes without infiltrate, pleural effusion, or pneumothorax. Improved aeration in LEFT lung since prior exam. Osseous demineralization with chronic compression deformity of a vertebra at the thoracolumbar junction. LEFT shoulder prosthesis and RIGHT glenohumeral degenerative changes. IMPRESSION: Postsurgical and emphysematous changes without acute infiltrate. Electronically Signed   By: Lavonia Dana M.D.   On: 07/29/2016 15:25    Assessment & Plan:   There are no diagnoses linked to this encounter. I am having Ms. Brink maintain her aspirin EC, Vitamin D3, Cyanocobalamin, rOPINIRole, oxyCODONE, meclizine, and fluconazole.  No orders of the defined types were placed in this encounter.    Follow-up: No Follow-up on file.  Walker Kehr, MD

## 2016-08-26 NOTE — Telephone Encounter (Signed)
RX resent

## 2016-08-26 NOTE — Assessment & Plan Note (Signed)
On Vit D 

## 2016-08-26 NOTE — Assessment & Plan Note (Signed)
On Diflucan

## 2016-08-26 NOTE — Assessment & Plan Note (Signed)
On B12 

## 2016-08-26 NOTE — Assessment & Plan Note (Signed)
ESR 

## 2016-08-26 NOTE — Telephone Encounter (Signed)
colchicine 0.6 MG tablet  fluconazole (DIFLUCAN) 200 MG   Pharmacy called reporting a drug interaction, recommended to reduce the dosage of Colchicine.

## 2016-08-26 NOTE — Telephone Encounter (Signed)
OK Colchicine qd Thx

## 2016-08-26 NOTE — Assessment & Plan Note (Signed)
f/u w/Dr Estanislado Pandy

## 2016-09-03 ENCOUNTER — Telehealth: Payer: Self-pay | Admitting: Internal Medicine

## 2016-09-03 NOTE — Telephone Encounter (Signed)
Pls see Dr Estanislado Pandy - as we discussed Thx

## 2016-09-03 NOTE — Telephone Encounter (Signed)
Please advise 

## 2016-09-03 NOTE — Telephone Encounter (Signed)
Pt called stating that she is still not doing any better. She is in so much pain that she can not walk. Please advise.

## 2016-09-04 NOTE — Telephone Encounter (Signed)
LM notifying pt

## 2016-09-16 ENCOUNTER — Ambulatory Visit (INDEPENDENT_AMBULATORY_CARE_PROVIDER_SITE_OTHER): Payer: Self-pay | Admitting: Surgery

## 2016-09-16 ENCOUNTER — Encounter: Payer: Self-pay | Admitting: Surgery

## 2016-09-16 VITALS — BP 128/72 | HR 76 | Resp 20 | Ht 62.0 in | Wt 134.0 lb

## 2016-09-16 DIAGNOSIS — Z95828 Presence of other vascular implants and grafts: Secondary | ICD-10-CM

## 2016-09-16 DIAGNOSIS — B370 Candidal stomatitis: Secondary | ICD-10-CM

## 2016-09-16 NOTE — Progress Notes (Signed)
     HPI:  The patient returns today for follow up s/p replacement of her aneurysmal aortic arch with aorto-innominate and aorto-left carotid artery bypasson 06/29/2016. She had a lot of problems since surgery with suspected oropharyngeal and esophageal candida infection. I treated her with Diflucan for the past couple months and this has finally subsided. She is no longer having the discomfort with swallowing that she was having. She is eating better. Her main complaint since I saw her last month is suspected gout involving the right first MTP joint. She has been on colchicine per Dr. Alain Marion but is still having pain and swelling. She has been scheduled to see Dr. Estanislado Pandy from rheumatology.  Current Outpatient Prescriptions  Medication Sig Dispense Refill  . aspirin EC 81 MG tablet Take 81 mg by mouth at bedtime.     . Cholecalciferol (VITAMIN D3) 2000 units capsule Take 1 capsule (2,000 Units total) by mouth daily. 100 capsule 3  . colchicine 0.6 MG tablet Take 1 tablet (0.6 mg total) by mouth daily as needed. Foot pain/gout 30 tablet 1  . Cyanocobalamin 1000 MCG SUBL Place 1 tablet (1,000 mcg total) under the tongue daily. 100 tablet 11  . fluconazole (DIFLUCAN) 200 MG tablet Take 1 tablet (200 mg total) by mouth daily. 30 tablet 1  . meclizine (ANTIVERT) 12.5 MG tablet Take 12.5 mg by mouth 3 (three) times daily as needed for dizziness.    Marland Kitchen oxyCODONE (OXYCONTIN) 10 mg 12 hr tablet Take 1 tablet (10 mg total) by mouth 2 (two) times daily. 60 tablet 0  . rOPINIRole (REQUIP) 2 MG tablet TAKE ONE TABLET BY MOUTH THREE TIMES DAILY 90 tablet 5   No current facility-administered medications for this visit.      Physical Exam: BP 128/72   Pulse 76   Resp 20   Ht 5\' 2"  (1.575 m)   Wt 134 lb (60.8 kg)   SpO2 98% Comment: RA  BMI 24.51 kg/m  She looks thin but stronger There is no yeast on tongue.  Lung exam is clear. Cardiac exam shows a regular rate and rhythm with a 2/6 systolic  flow murmur along the RSB Chest incision is healed and sternum is stable. The right subclavicular incision is healed.  There is no peripheral edema   Diagnostic Tests:  None today  Impression:  Overall I think she is doing well considering her age and chronic problems and the magnitude of the surgery. Her oropharyngeal and esophageal candida infection appears to be resolved. She may have gout but has never had it before and has not gotten better on colchicine after a couple weeks. She is scheduled to have a rheumatology evaluation.  Plan:  I am going to see her back in one year with an echo and CTA of the chest to assess her prosthetic aortic valve and the remainder of her thoracic aorta.   Gaye Pollack, MD [facility] [phone]

## 2016-09-17 DIAGNOSIS — Z8739 Personal history of other diseases of the musculoskeletal system and connective tissue: Secondary | ICD-10-CM | POA: Insufficient documentation

## 2016-09-17 DIAGNOSIS — Z8719 Personal history of other diseases of the digestive system: Secondary | ICD-10-CM | POA: Insufficient documentation

## 2016-09-17 DIAGNOSIS — Z862 Personal history of diseases of the blood and blood-forming organs and certain disorders involving the immune mechanism: Secondary | ICD-10-CM | POA: Insufficient documentation

## 2016-09-17 DIAGNOSIS — K589 Irritable bowel syndrome without diarrhea: Secondary | ICD-10-CM | POA: Insufficient documentation

## 2016-09-17 DIAGNOSIS — Z87448 Personal history of other diseases of urinary system: Secondary | ICD-10-CM | POA: Insufficient documentation

## 2016-09-17 DIAGNOSIS — Z86718 Personal history of other venous thrombosis and embolism: Secondary | ICD-10-CM | POA: Insufficient documentation

## 2016-09-17 NOTE — Progress Notes (Signed)
Office Visit Note  Patient: Diane Abbott             Date of Birth: 1934/06/11           MRN: [id number]             PCP: Cassandria Anger, MD Referring: Cassandria Anger, MD Visit Date: 09/25/2016 Occupation: _0 @    Subjective:  Pain of the Right Hand; Pain of the Left Hand; Pain of the Left Foot (States she had Gout flare / swelling pain ); and Medication Management (d/c infusions due to infection )   History of Present Illness: Diane Abbott is a 82 y.o. female with history of seronegative inflammatory arthritis most likely rheumatoid arthritis she has tried several medications in the past but had to come off the medication due to osteomyelitis. She was in remission so far. But had recent flare with increased pain and swelling in multiple joints. Patient was recently treated with possible gout her uric acid was within normal range. She's been taking colchicine without much results. She's been having increased pain and swelling in her hands and her feet her knee joints have been hurting as well. Last seen 11/28/2015. Please see SRS for that office note. Patient was scheduled to see Dr. Estanislado Pandy in December 2017 but our office cancel that appointment and this is the earliest time the patient can come and see [place].  Since the last visit in July 2017, patient has had aortic surgery. After the aorta surgery, patient developed gout in the left foot. Today, she is wearing a wooden shoe. She rates her "gout pain as 10 on a scale of 0-10." Her procedure as noted in EPIC from Jun 29, 2016 note:  Procedure(s) Performed: Procedure(s) with comments: REDO STERNOTOMY (N/A) AORTIC ARCH REPLACEMENT WITH CIRC ARREST (N/A) - RIGHT AXILLARY CANNULATION  AORTO-AORTIC 28 HEMASHIELD GRAFT WITH AORTO-INNOMINATE AND AORTO-LEFT CAROTID ANASTAMOSIS (N/A) - RIGHT AXILLARY CANNULATION   Dr. Alain Marion gave her colchicine for pain control. The most recent uric acid level from April 2018 is  6.9. (Note: Patient is not on allopurinol or Uloric) Patient was only given a small amount of colchicine by Dr. Alain Marion calls office because she was on Diflucan at the time. Patient is requesting refill on colchicine from our office now that she is off of Diflucan.   Patient also has seronegative rheumatoid arthritis. She's not on any DMARD's for her rheumatoid arthritis. Patient had Simponi Re: At one time but was discontinued when she had an infection in her spine.  Patient also has fibromyalgia syndrome. She rates her fibromyalgia pain as 5 on a scale of 0-10  Patient also has chronic kidney disease.   Activities of Daily Living:  Patient reports morning stiffness for all day hours.   Patient Reports nocturnal pain.  Difficulty dressing/grooming: Reports Difficulty climbing stairs: Reports Difficulty getting out of chair: Reports Difficulty using hands for taps, buttons, cutlery, and/or writing: Reports   Review of Systems  Constitutional: Positive for fatigue. Negative for night sweats, weight gain, weight loss and weakness.  HENT: Negative for mouth sores, trouble swallowing, trouble swallowing, mouth dryness and nose dryness.   Eyes: Negative for pain, redness, visual disturbance and dryness.  Respiratory: Negative for cough, shortness of breath and difficulty breathing.   Cardiovascular: Positive for hypertension. Negative for chest pain, palpitations, irregular heartbeat and swelling in legs/feet.  Gastrointestinal: Negative for blood in stool, constipation and diarrhea.  Endocrine: Negative for increased urination.  Genitourinary: Negative for  vaginal dryness.  Musculoskeletal: Positive for arthralgias, joint pain, joint swelling and morning stiffness. Negative for myalgias, muscle weakness, muscle tenderness and myalgias.  Skin: Negative for color change, rash, hair loss, skin tightness, ulcers and sensitivity to sunlight.  Allergic/Immunologic: Negative for susceptible  to infections.  Neurological: Negative for dizziness, memory loss and night sweats.  Hematological: Negative for swollen glands.  Psychiatric/Behavioral: Positive for sleep disturbance. Negative for depressed mood. The patient is not nervous/anxious.     PMFS History:  Patient Active Problem List   Diagnosis Date Noted  . History of discitis and osteomyelitis vertebrae 09/17/2016  . History of DVT (deep vein thrombosis) and PE 09/17/2016  . History of chronic kidney disease 09/17/2016  . History of myeloproliferative disorder 09/17/2016  . History of IBS 09/17/2016  . Foot pain, left 08/26/2016  . H/O aortic arch replacement 07/03/2016  . Thoracic ascending aortic aneurysm ([place]) 06/29/2016  . Upper respiratory infection 06/05/2016  . Anxiety state 01/02/2016  . Callus of foot 12/02/2015  . Leukopenia 11/11/2015  . Diskitis 08/29/2015  . Acute osteomyelitis of lumbar spine ([place]) 07/29/2015  . Thoracic compression fracture ([place]) 07/29/2015  . Discitis of lumbosacral region   . Epidural abscess   . Chronic renal insufficiency, stage III (moderate) 02/28/2015  . Hammer toe, acquired 11/29/2014  . Actinic keratoses 01/25/2014  . Thrombocytopenia due to drugs 11/24/2013  . Knee pain, right anterior 07/25/2013  . Atrophic vaginitis 03/23/2013  . S/P ascending aortic replacement 10/04/2012  . S/P AVR (aortic valve replacement) 10/04/2012  . Aortic aneurysm, thoracic ([place]) 09/14/2012  . Aortic insufficiency 09/14/2012  . Hyperglycemia 07/27/2012  . Wound, open, forearm 03/21/2012  . Osteoarthritis of left shoulder 12/07/2011  . Breast lump in female 04/07/2011  . COPD (chronic obstructive pulmonary disease) ([place]) 03/12/2011  . Esophagitis, unspecified 01/09/2011  . Insomnia 11/04/2010  . RESTLESS LEG SYNDROME 02/03/2010  . THRUSH 07/04/2009  . PRURITUS 05/16/2009  . PULMONARY EMBOLISM 09/14/2008  . Constipation 09/14/2008  . Edema 01/09/2008  . Pneumonia, organism  unspecified([place]) 07/13/2007  . Chest pain 07/13/2007  . Acute thromboembolism of deep veins of lower extremity ([place]) 07/08/2007  . GERD 07/08/2007  . Irritable bowel syndrome 07/08/2007  . Myalgia and myositis 07/08/2007  . CYSTITIS 05/31/2007  . Depression 05/11/2007  . B12 deficiency 03/10/2007  . Vitamin D deficiency 03/10/2007  . OSTEOARTHRITIS 03/10/2007  . Osteopenia 03/10/2007  . VERTIGO 03/10/2007  . PULMONARY EMBOLISM, HX OF 03/10/2007  . Rheumatoid arthritis ([place]) 11/23/2006    Past Medical History:  Diagnosis Date  . Adrenal insufficiency ([place])   . Anemia   . Anginal pain [facility])    "comes and goes has occured since 2012"  . Blood dyscrasia    "problems with platelets, red cells and white cells being low"  . Bronchitis Jan 2013-March 2013   severe  . Cancer ([place])    skin cancer on face  . Cataracts, bilateral    more in left than right  . Depression   . Esophagitis   . Fatty liver   . Fibromyalgia   . Fibromyalgia   . Foot pain, left 08/26/2016   2/18 1st MTP - gout vs other  . GERD (gastroesophageal reflux disease)   . Heart murmur   . History of blood clots 2008   below knee post trauma. Dilated [facility] no clotting disorder felt to be present.  . History of echocardiogram    Echo 11/16: Mild LVH, EF 60-65%, normal wall motion, grade  1 diastolic dysfunction, bioprosthetic AVR with mean gradient 21 mmHg and no regurgitation, proximal aortic arch aneurysm 49 mm, moderate TR, PASP 31 mmHg  . IBS (irritable bowel syndrome)   . Leaky heart valve   . Osteoarthritis   . Osteoarthritis of left shoulder 12/07/2011  . Osteopenia   . Pulmonary embolism ([place])   . RA (rheumatoid arthritis) ([place])   . Shortness of breath    attributed to aneurysm  . Sliding hiatal hernia   . Urinary leakage    when coughing  . Vertigo    hx of  . Vitamin B 12 deficiency   . Vitamin D deficiency     Family History  Problem Relation Age of Onset  . Ovarian cancer Mother     . Diabetes Mother   . COPD Father   . Heart disease Maternal Grandmother   . Clotting disorder Unknown        Father, brother, sister, and daughter all had deep venous thrombosis  . Breast cancer Unknown   . Pancreatic cancer Paternal Uncle   . Colon cancer Neg Hx    Past Surgical History:  Procedure Laterality Date  . ABDOMINAL HYSTERECTOMY    . AORTA SURGERY  12/2012   Resection of aneurysm and valve replacement  . APPENDECTOMY  1956  . BENTALL PROCEDURE N/A 09/29/2012   Procedure: BENTALL PROCEDURE;  Surgeon: Gaye Pollack, MD;  Location: [place];  Service: Open Heart Surgery;  Laterality: N/A;  WITH CIRC ARREST  . BILATERAL OOPHORECTOMY    . BREAST BIOPSY Left   . CARDIAC CATHETERIZATION  09/15/12   no PCI  . CATARACT EXTRACTION Bilateral   . CHOLECYSTECTOMY  2003  . COLONOSCOPY    . COSMETIC SURGERY     Tummy tuck  . INTRAOPERATIVE TRANSESOPHAGEAL ECHOCARDIOGRAM N/A 09/29/2012   Procedure: INTRAOPERATIVE TRANSESOPHAGEAL ECHOCARDIOGRAM;  Surgeon: Gaye Pollack, MD;  Location[facility];  Service: Open Heart Surgery;  Laterality: N/A;  . JOINT REPLACEMENT Bilateral   . REPLACEMENT ASCENDING AORTA N/A 06/29/2016   Procedure: AORTIC ARCH REPLACEMENT WITH CIRC ARREST (N/A) - RIGHT AXILLARY CANNULATION  AORTO-AORTIC 28 HEMASHIELD GRAFT WITH AORTO-INNOMINATE AND AORTO-LEFT CAROTID ANASTAMOSIS;  Surgeon: Gaye Pollack, MD;  Location: [facility] OR;  Service: Open Heart Surgery;  Laterality: N/A;  RIGHT AXILLARY CANNULATION  BILATERAL RADIAL A-LINE  . SKIN CANCER EXCISION Right    cheek  . TEE WITHOUT CARDIOVERSION N/A 06/29/2016   Procedure: TRANSESOPHAGEAL ECHOCARDIOGRAM (TEE);  Surgeon: Gaye Pollack, MD;  Location: [place];  Service: Open Heart Surgery;  Laterality: N/A;  . TONSILLECTOMY  1941  . TOTAL KNEE ARTHROPLASTY     bilateral  . TOTAL SHOULDER ARTHROPLASTY  12/07/2011   Procedure: TOTAL SHOULDER ARTHROPLASTY;  Surgeon: Johnny Bridge, MD;  Location: [place];  Service: Orthopedics;   Laterality: Left;  left total shoulder artthroplasty  . WRIST SURGERY     bilateral   Social History   Social History Narrative  . No narrative on file     Objective: Vital Signs: BP 112/60   Pulse 68   Resp 14   Wt 134 lb (60.8 kg)   BMI 24.51 kg/m    Physical Exam  Constitutional: She is oriented to person, place, and time. She appears well-developed and well-nourished.  HENT:  Head: Normocephalic and atraumatic.  Eyes: Conjunctivae and EOM are normal.  Neck: Normal range of motion.  Cardiovascular: Normal rate, regular rhythm, normal heart sounds and intact distal pulses.   Pulmonary/Chest:  Effort normal and breath sounds normal.  Abdominal: Soft. Bowel sounds are normal.  Lymphadenopathy:    She has no cervical adenopathy.  Neurological: She is alert and oriented to person, place, and time.  Skin: Skin is warm and dry. Capillary refill takes less than 2 seconds.  Psychiatric: She has a normal mood and affect. Her behavior is normal.  Nursing note and vitals reviewed.    Musculoskeletal Exam: C-spine and thoracic spine good range of motion. She is lumbar spine decreased range of motion. Shoulder joints elbow joints are good range of motion. She tenosynovitis in her left wrist joint. She has DIP PIP and MCP synovitis as described below. Bilateral knee joints are replaced with some warmth but no effusion. Her left foot has lost swelling with all MTP swollen.  CDAI Exam: CDAI Homunculus Exam:   Tenderness:  LUE: wrist Right hand: 2nd DIP Left hand: 2nd MCP and 2nd DIP Left foot: 1st MTP, 2nd MTP, 3rd MTP and 4th MTP  Swelling:  LUE: wrist Right hand: 2nd DIP Left hand: 2nd DIP Left foot: 1st MTP, 2nd MTP, 3rd MTP and 4th MTP  Joint Counts:  CDAI Tender Joint count: 2 CDAI Swollen Joint count: 1  Global Assessments:  Patient Global Assessment: 8 Provider Global Assessment: 8  CDAI Calculated Score: 19    Investigation: No additional  findings.  Appointment on 08/26/2016  Component Date Value Ref Range Status  . WBC 08/26/2016 5.1  4.0 - 10.5 K/uL Final  . RBC 08/26/2016 3.57* 3.87 - 5.11 Mil/uL Final  . Hemoglobin 08/26/2016 10.5* 12.0 - 15.0 g/dL Final  . HCT 08/26/2016 31.9* 36.0 - 46.0 % Final  . MCV 08/26/2016 89.4  78.0 - 100.0 fl Final  . MCHC 08/26/2016 32.9  30.0 - 36.0 g/dL Final  . RDW 08/26/2016 15.9* 11.5 - 15.5 % Final  . Platelets 08/26/2016 195.0  150.0 - 400.0 K/uL Final  . Neutrophils Relative % 08/26/2016 60.6  43.0 - 77.0 % Final  . Lymphocytes Relative 08/26/2016 24.8  12.0 - 46.0 % Final  . Monocytes Relative 08/26/2016 10.1  3.0 - 12.0 % Final  . Eosinophils Relative 08/26/2016 3.3  0.0 - 5.0 % Final  . Basophils Relative 08/26/2016 1.2  0.0 - 3.0 % Final  . Neutro Abs 08/26/2016 3.1  1.4 - 7.7 K/uL Final  . Lymphs Abs 08/26/2016 1.3  0.7 - 4.0 K/uL Final  . Monocytes Absolute 08/26/2016 0.5  0.1 - 1.0 K/uL Final  . Eosinophils Absolute 08/26/2016 0.2  0.0 - 0.7 K/uL Final  . Basophils Absolute 08/26/2016 0.1  0.0 - 0.1 K/uL Final  . Sodium 08/26/2016 138  135 - 145 mEq/L Final  . Potassium 08/26/2016 4.9  3.5 - 5.1 mEq/L Final  . Chloride 08/26/2016 104  96 - 112 mEq/L Final  . CO2 08/26/2016 28  19 - 32 mEq/L Final  . Glucose, Bld 08/26/2016 102* 70 - 99 mg/dL Final  . BUN 08/26/2016 31* 6 - 23 mg/dL Final  . Creatinine, Ser 08/26/2016 1.41* 0.40 - 1.20 mg/dL Final  . Calcium 08/26/2016 9.8  8.4 - 10.5 mg/dL Final  . GFR 08/26/2016 38.12* >60.00 mL/min Final  . Uric Acid, Serum 08/26/2016 6.9  2.4 - 7.0 mg/dL Final  . Sed Rate 08/26/2016 63* 0 - 30 mm/hr Final  . Total Bilirubin 08/26/2016 0.3  0.2 - 1.2 mg/dL Final  . Bilirubin, Direct 08/26/2016 0.1  0.0 - 0.3 mg/dL Final  . Alkaline Phosphatase 08/26/2016 71  39 -  117 U/L Final  . AST 08/26/2016 12  0 - 37 U/L Final  . ALT 08/26/2016 6  0 - 35 U/L Final  . Total Protein 08/26/2016 7.6  6.0 - 8.3 g/dL Final  . Albumin 08/26/2016  3.7  3.5 - 5.2 g/dL Final  . Vitamin B-12 08/26/2016 1421* 211 - 911 pg/mL Final  . CRP 08/26/2016 2.5  0.5 - 20.0 mg/dL Final  Admission on 06/29/2016, Discharged on 07/04/2016  No results displayed because visit has over 200 results.    Hospital Outpatient Visit on 06/24/2016  Component Date Value Ref Range Status  . aPTT 06/24/2016 20* 24 - 36 seconds Final  . FIO2 06/24/2016 21.00   Final  . pH, Arterial 06/24/2016 7.400  7.350 - 7.450 Final  . pCO2 arterial 06/24/2016 41.1  32.0 - 48.0 mmHg Final  . pO2, Arterial 06/24/2016 98.0  83.0 - 108.0 mmHg Final  . Bicarbonate 06/24/2016 24.9  20.0 - 28.0 mmol/L Final  . Acid-Base Excess 06/24/2016 0.7  0.0 - 2.0 mmol/L Final  . O2 Saturation 06/24/2016 97.1  % Final  . Patient temperature 06/24/2016 98.6   Final  . Collection site 06/24/2016 LEFT RADIAL   Final  . Drawn by 06/24/2016 [id number]   Final  . Sample type 06/24/2016 ARTERIAL DRAW   Final  . [place] Reading test (pass/fail) 06/24/2016 PASS  PASS Final  . WBC 06/24/2016 4.5  4.0 - 10.5 K/uL Final  . RBC 06/24/2016 3.83* 3.87 - 5.11 MIL/uL Final  . Hemoglobin 06/24/2016 11.4* 12.0 - 15.0 g/dL Final  . HCT 06/24/2016 34.6* 36.0 - 46.0 % Final  . MCV 06/24/2016 90.3  78.0 - 100.0 fL Final  . MCH 06/24/2016 29.8  26.0 - 34.0 pg Final  . MCHC 06/24/2016 32.9  30.0 - 36.0 g/dL Final  . RDW 06/24/2016 13.4  11.5 - 15.5 % Final  . Platelets 06/24/2016 157  150 - 400 K/uL Final  . Sodium 06/24/2016 138  135 - 145 mmol/L Final  . Potassium 06/24/2016 4.0  3.5 - 5.1 mmol/L Final  . Chloride 06/24/2016 106  101 - 111 mmol/L Final  . CO2 06/24/2016 21* 22 - 32 mmol/L Final  . Glucose, Bld 06/24/2016 117* 65 - 99 mg/dL Final  . BUN 06/24/2016 25* 6 - 20 mg/dL Final  . Creatinine, Ser 06/24/2016 1.25* 0.44 - 1.00 mg/dL Final  . Calcium 06/24/2016 9.4  8.9 - 10.3 mg/dL Final  . Total Protein 06/24/2016 7.7  6.5 - 8.1 g/dL Final  . Albumin 06/24/2016 3.6  3.5 - 5.0 g/dL Final  . AST 06/24/2016 16   15 - 41 U/L Final  . ALT 06/24/2016 11* 14 - 54 U/L Final  . Alkaline Phosphatase 06/24/2016 88  38 - 126 U/L Final  . Total Bilirubin 06/24/2016 0.5  0.3 - 1.2 mg/dL Final  . GFR calc non Af Amer 06/24/2016 40* >60 mL/min Final  . GFR calc Af Amer 06/24/2016 46* >60 mL/min Final   Comment: (NOTE) The eGFR has been calculated using the CKD EPI equation. This calculation has not been validated in all clinical situations. eGFR's persistently <60 mL/min signify possible Chronic Kidney Disease.   . Anion gap 06/24/2016 11  5 - 15 Final  . Hgb A1c MFr Bld 06/24/2016 5.5  4.8 - 5.6 % Final   Comment: (NOTE)         Pre-diabetes: 5.7 - 6.4         Diabetes: >6.4  Glycemic control for adults with diabetes: <7.0   . Mean Plasma Glucose 06/24/2016 111   Final   Comment: (NOTE) Performed At: [facility], [place] [id number] Lindon Romp MD XI:[id number]   . Prothrombin Time 06/24/2016 13.5  11.4 - 15.2 seconds Final  . INR 06/24/2016 1.03   Final  . ABO/RH(D) 06/24/2016 A POS   Final  . Antibody Screen 06/24/2016 NEG   Final  . Sample Expiration 06/24/2016 07/02/2016   Final  . Extend sample reason 06/24/2016 NO TRANSFUSIONS OR PREGNANCY IN THE PAST 3 MONTHS   Final  . Unit Number 06/24/2016 [id number]   Final  . Blood Component Type 06/24/2016 RED CELLS,LR   Final  . Unit division 06/24/2016 00   Final  . Status of Unit 06/24/2016 ISSUED,FINAL   Final  . Transfusion Status 06/24/2016 OK TO TRANSFUSE   Final  . Crossmatch Result 06/24/2016 Compatible   Final  . Unit Number 06/24/2016 [id number]   Final  . Blood Component Type 06/24/2016 RED CELLS,LR   Final  . Unit division 06/24/2016 00   Final  . Status of Unit 06/24/2016 REL FROM [facility]   Final  . Transfusion Status 06/24/2016 OK TO TRANSFUSE   Final  . Crossmatch Result 06/24/2016 Compatible   Final  . Unit Number 06/24/2016 [id number]   Final  . Blood Component Type 06/24/2016  RED CELLS,LR   Final  . Unit division 06/24/2016 00   Final  . Status of Unit 06/24/2016 ISSUED,FINAL   Final  . Transfusion Status 06/24/2016 OK TO TRANSFUSE   Final  . Crossmatch Result 06/24/2016 Compatible   Final  . Unit Number 06/24/2016 [id number]   Final  . Blood Component Type 06/24/2016 RED CELLS,LR   Final  . Unit division 06/24/2016 00   Final  . Status of Unit 06/24/2016 ISSUED,FINAL   Final  . Transfusion Status 06/24/2016 OK TO TRANSFUSE   Final  . Crossmatch Result 06/24/2016 Compatible   Final  . Unit Number 06/24/2016 [id number]   Final  . Blood Component Type 06/24/2016 RED CELLS,LR   Final  . Unit division 06/24/2016 00   Final  . Status of Unit 06/24/2016 REL FROM [facility]   Final  . Transfusion Status 06/24/2016 OK TO TRANSFUSE   Final  . Crossmatch Result 06/24/2016 Compatible   Final  . Unit Number 06/24/2016 [id number]   Final  . Blood Component Type 06/24/2016 RED CELLS,LR   Final  . Unit division 06/24/2016 00   Final  . Status of Unit 06/24/2016 REL FROM [facility]   Final  . Transfusion Status 06/24/2016 OK TO TRANSFUSE   Final  . Crossmatch Result 06/24/2016 Compatible   Final  . Color, Urine 06/24/2016 YELLOW  YELLOW Final  . APPearance 06/24/2016 CLEAR  CLEAR Final  . Specific Gravity, Urine 06/24/2016 1.023  1.005 - 1.030 Final  . pH 06/24/2016 5.0  5.0 - 8.0 Final  . Glucose, UA 06/24/2016 NEGATIVE  NEGATIVE mg/dL Final  . Hgb urine dipstick 06/24/2016 SMALL* NEGATIVE Final  . Bilirubin Urine 06/24/2016 NEGATIVE  NEGATIVE Final  . Ketones, ur 06/24/2016 NEGATIVE  NEGATIVE mg/dL Final  . Protein, ur 06/24/2016 NEGATIVE  NEGATIVE mg/dL Final  . Nitrite 06/24/2016 NEGATIVE  NEGATIVE Final  . Leukocytes, UA 06/24/2016 TRACE* NEGATIVE Final  . RBC / HPF 06/24/2016 0-5  0 - 5 RBC/hpf Final  . WBC, UA 06/24/2016 0-5  0 - 5 WBC/hpf Final  . Bacteria, UA 06/24/2016 NONE SEEN  NONE SEEN Final  . Squamous Epithelial / LPF 06/24/2016 0-5* NONE SEEN Final   . Mucous 06/24/2016 PRESENT   Final  . Hyaline Casts, UA 06/24/2016 PRESENT   Final  . MRSA, PCR 06/24/2016 POSITIVE* NEGATIVE Final   Comment: RESULT CALLED TO, READ BACK BY AND VERIFIED WITH: York Spaniel RN 15:00 06/24/16 ([email or web address])   . Staphylococcus aureus 06/24/2016 POSITIVE* NEGATIVE Final   Comment:        The Xpert SA Assay (FDA approved for NASAL specimens in patients over 63 years of age), is one component of a comprehensive surveillance program.  Test performance has been validated by [facility] for patients greater than or equal to 73 year old. It is not intended to diagnose infection nor to guide or monitor treatment.   . ISSUE DATE / TIME 06/24/2016 [id number]   Final  . Blood Product Unit Number 06/24/2016 [id number]   Final  . PRODUCT CODE 06/24/2016 [id number]   Final  . Unit Type and Rh 06/24/2016 6200   Final  . Blood Product Expiration Date 06/24/2016 [id number]   Final  . ISSUE DATE / TIME 06/24/2016 [id number]   Final  . Blood Product Unit Number 06/24/2016 [id number]   Final  . PRODUCT CODE 06/24/2016 [id number]   Final  . Unit Type and Rh 06/24/2016 6200   Final  . Blood Product Expiration Date 06/24/2016 [id number]   Final  . ISSUE DATE / TIME 06/24/2016 [id number]   Final  . Blood Product Unit Number 06/24/2016 [id number]   Final  . PRODUCT CODE 06/24/2016 [id number]   Final  . Unit Type and Rh 06/24/2016 6200   Final  . Blood Product Expiration Date 06/24/2016 [id number]   Final  . ISSUE DATE / TIME 06/24/2016 [id number]   Final  . Blood Product Unit Number 06/24/2016 [id number]   Final  . PRODUCT CODE 06/24/2016 [id number]   Final  . Unit Type and Rh 06/24/2016 6200   Final  . Blood Product Expiration Date 06/24/2016 [id number]   Final  . Blood Product Unit Number 06/24/2016 [id number]   Final  . Unit Type and Rh 06/24/2016 6200   Final  . Blood Product Expiration Date 06/24/2016 [id number]   Final  . Blood Product Unit  Number 06/24/2016 [id number]   Final  . Unit Type and Rh 06/24/2016 6200   Final  . Blood Product Expiration Date 06/24/2016 [id number]   Final  Hospital Outpatient Visit on 06/24/2016  Component Date Value Ref Range Status  . FVC-Pre 06/24/2016 2.33  L Final  . FVC-%Pred-Pre 06/24/2016 115  % Final  . FVC-Post 06/24/2016 2.29  L Final  . FVC-%Pred-Post 06/24/2016 113  % Final  . FVC-%Change-Post 06/24/2016 -1  % Final  . FEV1-Pre 06/24/2016 1.84  L Final  . FEV1-%Pred-Pre 06/24/2016 124  % Final  . FEV1-Post 06/24/2016 1.83  L Final  . FEV1-%Pred-Post 06/24/2016 123  % Final  . FEV1-%Change-Post 06/24/2016 0  % Final  . FEV6-Pre 06/24/2016 2.33  L Final  . FEV6-%Pred-Pre 06/24/2016 123  % Final  . FEV6-Post 06/24/2016 2.29  L Final  . FEV6-%Pred-Post 06/24/2016 121  % Final  . FEV6-%Change-Post 06/24/2016 -1  % Final  . Pre FEV1/FVC ratio 06/24/2016 79  % Final  . FEV1FVC-%Pred-Pre 06/24/2016 106  % Final  . Post FEV1/FVC ratio 06/24/2016 80  % Final  . FEV1FVC-%Change-Post 06/24/2016 1  % Final  . Pre FEV6/FVC Ratio 06/24/2016 100  % Final  . FEV6FVC-%Pred-Pre 06/24/2016 106  %  Final  . Post FEV6/FVC ratio 06/24/2016 100  % Final  . FEV6FVC-%Pred-Post 06/24/2016 106  % Final  . FEF 25-75 Pre 06/24/2016 1.63  L/sec Final  . FEF2575-%Pred-Pre 06/24/2016 143  % Final  . FEF 25-75 Post 06/24/2016 1.52  L/sec Final  . FEF2575-%Pred-Post 06/24/2016 134  % Final  . FEF2575-%Change-Post 06/24/2016 -6  % Final  . RV 06/24/2016 1.84  L Final  . RV % pred 06/24/2016 86  % Final  . TLC 06/24/2016 4.29  L Final  . TLC % pred 06/24/2016 99  % Final  . DLCO unc 06/24/2016 12.98  ml/min/mmHg Final  . DLCO unc % pred 06/24/2016 74  % Final  . DL/VA 06/24/2016 4.08  ml/min/mmHg/L Final  . DL/VA % pred 06/24/2016 99  % Final  Hospital Outpatient Visit on 06/24/2016  Component Date Value Ref Range Status  . Right CCA prox sys 06/24/2016 69  cm/s Final  . Right CCA prox dias 06/24/2016  17  cm/s Final  . Right cca dist sys 06/24/2016 -91  cm/s Final  . Left CCA prox sys 06/24/2016 -71  cm/s Final  . Left CCA prox dias 06/24/2016 -16  cm/s Final  . Left CCA dist sys 06/24/2016 72  cm/s Final  . Left CCA dist dias 06/24/2016 19  cm/s Final  . Left ICA prox sys 06/24/2016 -57  cm/s Final  . Left ICA prox dias 06/24/2016 -22  cm/s Final  . Left ICA dist sys 06/24/2016 137  cm/s Final  . Left ICA dist dias 06/24/2016 49  cm/s Final  . RIGHT ECA DIAS 06/24/2016 -9.00  cm/s Final  . RIGHT VERTEBRAL DIAS 06/24/2016 8.00  cm/s Final  . LEFT ECA DIAS 06/24/2016 -7.00  cm/s Final  . LEFT VERTEBRAL DIAS 06/24/2016 8.00  cm/s Final     Imaging: Xr Foot 2 Views Left  Result Date: 09/25/2016 All MTP narrowing was noted. PIP narrowing was noted. Erosive changes were noted in right first second third fourth and fifth MTP joints. A small calcaneal spur was noted. Subtalar joint space narrowing was noted. Impression: These findings are consistent with rheumatoid arthritis with erosive changes.  Xr Foot 2 Views Right  Result Date: 09/25/2016 All MTP narrowing was noted. PIP narrowing was noted. Erosive changes were noted in right first second third fourth and fifth MTP joints. Screws were noted in right third fourth and fifth MTP joints. Erosive changes at the base of the right first and fifth metatarsal was noted. A small calcaneal spur was noted. Impression: These findings are consistent with rheumatoid arthritis with erosive changes.  Xr Hand 2 View Left  Result Date: 09/25/2016 Severe first second and third fourth fifth MCP narrowing with erosive changes changes and invagination.  PIP/DIP joint space narrowing was noted. Erosive changes were noted and first and second DIP joints. Severe metacarpal carpal intercarpal and radiocarpal joint space narrowing was noted. Erosive changes were noted throughout the carpal bones ulnar styloid. Impression these findings are consistent with severe  rheumatoid arthritis with erosive changes  Xr Hand 2 View Right  Result Date: 09/25/2016 Severe first second and third fourth fifth MCP narrowing with erosive changes changes and invagination. Subluxation of first and second MCP joint was noted. PIP/DIP joint space narrowing was noted. Erosive changes were noted in first  DIP joint. Severe metacarpal carpal intercarpal and radiocarpal joint space narrowing was noted. Erosive changes were noted throughout the carpal bones ulnar styloid. Impression these findings are consistent with severe  rheumatoid arthritis with erosive changes   Speciality Comments: No specialty comments available.    Procedures:  No procedures performed Allergies: Acetaminophen; Codeine; Pramipexole dihydrochloride; Rofecoxib; Venlafaxine; Arava [leflunomide]; Clonazepam; Diclofenac sodium; Alprazolam; Darvocet [propoxyphene n-acetaminophen]; Duloxetine; Gabapentin; Latex; Morphine and related; Nortriptyline hcl; Penicillins; and Prednisone   Assessment / Plan:     Visit Diagnoses: Rheumatoid arthritis of multiple sites with negative rheumatoid factor ([place]) patient appears to be having a flare of rheumatoid arthritis. She has severe erosive disease on her x-rays. I did not have comparison films available today to compare. She had synovitis in her wrists joint in her. MCPs and DIP joints today. She also had left foot swelling. There was a cushion about possible gout flare in her left foot. She's tried colchicine without any results. I did offer prednisone as a bridging therapy which she declined. She has tried several medications in the past and had intolerance to most medications. She did really well on Enbrel in the past. Simponi Donalda Ewings was discontinued in January 2017 due to development of osteomyelitis. I would need clearance from infectious disease before starting her on any biologic DMARD's. We had detailed discussion regarding different treatment options and their side  effects. After reviewing indications side effects contraindications we decided to start on low-dose Arava. She's had problems with pedal edema in the past with Arava. I did discuss with her this is not a known side effect Arava although we will try low-dose Arava this point. I'll obtain the following labs and start her on Arava 10 mg by mouth daily. Informed consent was obtained today. We'll check labs in 2 weeks and then every 2 months.  High risk medication use - Simponi Donalda Ewings was held in January 2017, (inadequate response to Cimzia, Humira, intolerance to methotrexate, Remicade) - Plan: Thiopurine methyltransferase(tpmt)rbc, Quantiferon tb gold assay (blood), Serum protein electrophoresis with reflex, CBC with Differential/Platelet, COMPLETE METABOLIC PANEL WITH GFR, CBC with Differential/Platelet, COMPLETE METABOLIC PANEL WITH GFR  Pain in both hands - Plan: XR Hand 2 View Right, XR Hand 2 View Left  Pain in both feet - Plan: XR Foot 2 Views Right, XR Foot 2 Views Left  History of total shoulder replacement, left  History of total knee arthroplasty, bilateral  Foot pain, left - Plan: Uric acid, Cyclic citrul peptide antibody, IgG, Rheumatoid factor  Closed compression fracture of thoracic vertebra with routine healing, subsequent encounter  DDD (degenerative disc disease), lumbar  History of discitis and osteomyelitis vertebrae  Vitamin D deficiency  Osteopenia of multiple sites  Primary insomnia  Fibromyalgia  History of COPD  History of gastroesophageal reflux (GERD)  History of anxiety  Restless leg syndrome  History of chronic kidney disease  History of DVT (deep vein thrombosis) and PE  S/P AVR (aortic valve replacement)  History of myeloproliferative disorder  History of IBS  Thrombocytopenia due to drugs  Acute thromboembolism of deep veins of left lower extremity (HCC)  Thoracic aortic aneurysm without rupture (HCC)  H/O aortic arch  replacement  PULMONARY EMBOLISM, HX OF    Orders: Orders Placed This Encounter  Procedures  . XR Hand 2 View Right  . XR Hand 2 View Left  . XR Foot 2 Views Right  . XR Foot 2 Views Left  . Uric acid  . Cyclic citrul peptide antibody, IgG  . Rheumatoid factor  . Thiopurine methyltransferase(tpmt)rbc  . Quantiferon tb gold assay (blood)  . Serum protein electrophoresis with reflex  . CBC with Differential/Platelet  . COMPLETE METABOLIC  PANEL WITH GFR  . CBC with Differential/Platelet  . COMPLETE METABOLIC PANEL WITH GFR   No orders of the defined types were placed in this encounter.   Face-to-face time spent with patient was 60 minutes. 50% of time was spent in counseling and coordination of care.  Follow-Up Instructions: Return in about 6 weeks (around 11/06/2016) for Rheumatoid arthritis.   Bo Merino, MD  Note - This record has been created using Editor, commissioning.  Chart creation errors have been sought, but may not always  have been located. Such creation errors do not reflect on  the standard of medical care.

## 2016-09-25 ENCOUNTER — Ambulatory Visit (INDEPENDENT_AMBULATORY_CARE_PROVIDER_SITE_OTHER): Payer: Medicare HMO

## 2016-09-25 ENCOUNTER — Encounter: Payer: Self-pay | Admitting: Rheumatology

## 2016-09-25 ENCOUNTER — Ambulatory Visit (INDEPENDENT_AMBULATORY_CARE_PROVIDER_SITE_OTHER): Payer: Medicare HMO | Admitting: Rheumatology

## 2016-09-25 VITALS — BP 112/60 | HR 68 | Resp 14 | Wt 134.0 lb

## 2016-09-25 DIAGNOSIS — M79672 Pain in left foot: Secondary | ICD-10-CM

## 2016-09-25 DIAGNOSIS — F5101 Primary insomnia: Secondary | ICD-10-CM

## 2016-09-25 DIAGNOSIS — M79671 Pain in right foot: Secondary | ICD-10-CM | POA: Diagnosis not present

## 2016-09-25 DIAGNOSIS — S22000D Wedge compression fracture of unspecified thoracic vertebra, subsequent encounter for fracture with routine healing: Secondary | ICD-10-CM | POA: Diagnosis not present

## 2016-09-25 DIAGNOSIS — Z8719 Personal history of other diseases of the digestive system: Secondary | ICD-10-CM

## 2016-09-25 DIAGNOSIS — Z8739 Personal history of other diseases of the musculoskeletal system and connective tissue: Secondary | ICD-10-CM | POA: Diagnosis not present

## 2016-09-25 DIAGNOSIS — Z95828 Presence of other vascular implants and grafts: Secondary | ICD-10-CM

## 2016-09-25 DIAGNOSIS — M79642 Pain in left hand: Secondary | ICD-10-CM

## 2016-09-25 DIAGNOSIS — Z87448 Personal history of other diseases of urinary system: Secondary | ICD-10-CM

## 2016-09-25 DIAGNOSIS — Z952 Presence of prosthetic heart valve: Secondary | ICD-10-CM

## 2016-09-25 DIAGNOSIS — G2581 Restless legs syndrome: Secondary | ICD-10-CM

## 2016-09-25 DIAGNOSIS — M5136 Other intervertebral disc degeneration, lumbar region: Secondary | ICD-10-CM

## 2016-09-25 DIAGNOSIS — I712 Thoracic aortic aneurysm, without rupture, unspecified: Secondary | ICD-10-CM

## 2016-09-25 DIAGNOSIS — T50905A Adverse effect of unspecified drugs, medicaments and biological substances, initial encounter: Secondary | ICD-10-CM

## 2016-09-25 DIAGNOSIS — M8589 Other specified disorders of bone density and structure, multiple sites: Secondary | ICD-10-CM

## 2016-09-25 DIAGNOSIS — E559 Vitamin D deficiency, unspecified: Secondary | ICD-10-CM | POA: Diagnosis not present

## 2016-09-25 DIAGNOSIS — Z96612 Presence of left artificial shoulder joint: Secondary | ICD-10-CM | POA: Diagnosis not present

## 2016-09-25 DIAGNOSIS — I82402 Acute embolism and thrombosis of unspecified deep veins of left lower extremity: Secondary | ICD-10-CM

## 2016-09-25 DIAGNOSIS — D6959 Other secondary thrombocytopenia: Secondary | ICD-10-CM

## 2016-09-25 DIAGNOSIS — M51369 Other intervertebral disc degeneration, lumbar region without mention of lumbar back pain or lower extremity pain: Secondary | ICD-10-CM

## 2016-09-25 DIAGNOSIS — Z96653 Presence of artificial knee joint, bilateral: Secondary | ICD-10-CM

## 2016-09-25 DIAGNOSIS — Z79899 Other long term (current) drug therapy: Secondary | ICD-10-CM | POA: Diagnosis not present

## 2016-09-25 DIAGNOSIS — M797 Fibromyalgia: Secondary | ICD-10-CM | POA: Diagnosis not present

## 2016-09-25 DIAGNOSIS — M0609 Rheumatoid arthritis without rheumatoid factor, multiple sites: Secondary | ICD-10-CM

## 2016-09-25 DIAGNOSIS — Z862 Personal history of diseases of the blood and blood-forming organs and certain disorders involving the immune mechanism: Secondary | ICD-10-CM

## 2016-09-25 DIAGNOSIS — M79641 Pain in right hand: Secondary | ICD-10-CM

## 2016-09-25 DIAGNOSIS — Z8659 Personal history of other mental and behavioral disorders: Secondary | ICD-10-CM

## 2016-09-25 DIAGNOSIS — Z86718 Personal history of other venous thrombosis and embolism: Secondary | ICD-10-CM

## 2016-09-25 DIAGNOSIS — Z8709 Personal history of other diseases of the respiratory system: Secondary | ICD-10-CM

## 2016-09-25 MED ORDER — LEFLUNOMIDE 10 MG PO TABS: 10.0000 mg | ORAL_TABLET | Freq: Every day | ORAL | 1 refills | Status: DC

## 2016-09-25 NOTE — Patient Instructions (Signed)
Standing Labs We placed an order today for your standing lab work.    Please come back and get your standing labs in 2 weeks then every 2 months  We have open lab Monday through Friday from 8:30-11:30 AM and 1:30-4 PM at the office of Dr. Tresa Moore, PA.   The office is located at [address], [place], [place], [place] [address] No appointment is necessary.   Labs are drawn by [facility].  You may receive a bill from [facility] for your lab work. If you have any questions regarding directions or hours of operation,  please call [phone].    Leflunomide tablets What is this medicine? LEFLUNOMIDE (le FLOO na mide) is for rheumatoid arthritis. This medicine may be used for other purposes; ask your health care provider or pharmacist if you have questions. COMMON BRAND NAME(S): Arava What should I tell my health care provider before I take this medicine? They need to know if you have any of these conditions: -alcoholism -bone marrow problems -fever or infection -immune system problems -kidney disease -liver disease -an unusual or allergic reaction to leflunomide, teriflunomide, other medicines, lactose, foods, dyes, or preservatives -pregnant or trying to get pregnant -breast-feeding How should I use this medicine? Take this medicine by mouth with a full glass of water. Follow the directions on the prescription label. Take your medicine at regular intervals. Do not take your medicine more often than directed. Do not stop taking except on your doctor's advice. Talk to your pediatrician regarding the use of this medicine in children. Special care may be needed. Overdosage: If you think you have taken too much of this medicine contact a poison control center or emergency room at once. NOTE: This medicine is only for you. Do not share this medicine with others. What if I miss a dose? If you miss a dose, take it as soon as you can. If it is almost time for your next  dose, take only that dose. Do not take double or extra doses. What may interact with this medicine? Do not take this medicine with any of the following medications: -teriflunomide This medicine may also interact with the following medications: -charcoal -cholestyramine -methotrexate -NSAIDs, medicines for pain and inflammation, like ibuprofen or naproxen -phenytoin -rifampin -tolbutamide -vaccines -warfarin This list may not describe all possible interactions. Give your health care provider a list of all the medicines, herbs, non-prescription drugs, or dietary supplements you use. Also tell them if you smoke, drink alcohol, or use illegal drugs. Some items may interact with your medicine. What should I watch for while using this medicine? Visit your doctor or health care professional for regular checks on your progress. You will need frequent blood checks while you are receiving the medicine. If you get a cold or other infection while receiving this medicine, call your doctor or health care professional. Do not treat yourself. The medicine may increase your risk of getting an infection. If you are a woman who has the potential to become pregnant, discuss birth control options with your doctor or health care professional. Dennis Bast must not be pregnant, and you must be using a reliable form of birth control. The medicine may harm an unborn baby. Immediately call your doctor if you think you might be pregnant. Alcoholic drinks may increase possible damage to your liver. Do not drink alcohol while taking this medicine. What side effects may I notice from receiving this medicine? Side effects that you should report to your doctor or health  care professional as soon as possible: -allergic reactions like skin rash, itching or hives, swelling of the face, lips, or tongue -cough -difficulty breathing or shortness of breath -fever, chills or any other sign of infection -redness, blistering, peeling or  loosening of the skin, including inside the mouth -unusual bleeding or bruising -unusually weak or tired -vomiting -yellowing of eyes or skin Side effects that usually do not require medical attention (report to your doctor or health care professional if they continue or are bothersome): -diarrhea -hair loss -headache -nausea This list may not describe all possible side effects. Call your doctor for medical advice about side effects. You may report side effects to FDA at 1-800-FDA-1088. Where should I keep my medicine? Keep out of the reach of children. Store at room temperature between 15 and 30 degrees C (59 and 86 degrees F). Protect from moisture and light. Throw away any unused medicine after the expiration date. NOTE: This sheet is a summary. It may not cover all possible information. If you have questions about this medicine, talk to your doctor, pharmacist, or health care provider.  2018 Elsevier/Gold Standard (2013-04-18 10:53:11)

## 2016-09-25 NOTE — Progress Notes (Signed)
Pharmacy Note  Subjective: Patient presents today to the [facility] to see Dr. Estanislado Pandy.  Detailed discussion regarding treatment options for patient.  Decision was made to start patient on leflunomide Jolee Ewing).  Noted patient has allergy listed to leflunomide - swelling.  Patient does not remember this allergy.  Upon review of her previous notes, patient was started on leflunomide on 03/28/2012.  She called several weeks later with complaints of feet swelling.  The medication was discontinued.  We discussed that pedal edema is not generally associated with leflunomide.  Today, patient wants to retry leflunomide.  Patient seen by the pharmacist for counseling on leflunomide Jolee Ewing).    Objective: CBC    Component Value Date/Time   WBC 5.1 08/26/2016 0822   RBC 3.57 (L) 08/26/2016 0822   HGB 10.5 (L) 08/26/2016 0822   HGB 10.6 (L) 11/11/2015 1546   HCT 31.9 (L) 08/26/2016 0822   HCT 32.1 (L) 11/11/2015 1546   HCT 31.4 (L) 11/11/2015 1546   PLT 195.0 08/26/2016 0822   PLT 134 (L) 11/11/2015 1546   MCV 89.4 08/26/2016 0822   MCV 96.1 11/11/2015 1546   MCH 29.0 07/01/2016 0426   MCHC 32.9 08/26/2016 0822   RDW 15.9 (H) 08/26/2016 0822   RDW 13.6 11/11/2015 1546   LYMPHSABS 1.3 08/26/2016 0822   LYMPHSABS 1.3 11/11/2015 1546   MONOABS 0.5 08/26/2016 0822   MONOABS 0.3 11/11/2015 1546   EOSABS 0.2 08/26/2016 0822   EOSABS 0.4 11/11/2015 1546   BASOSABS 0.1 08/26/2016 0822   BASOSABS 0.0 11/11/2015 1546   CMP     Component Value Date/Time   NA 138 08/26/2016 0822   NA 144 11/11/2015 1546   K 4.9 08/26/2016 0822   K 5.0 11/11/2015 1546   CL 104 08/26/2016 0822   CL 105 09/13/2012 1427   CO2 28 08/26/2016 0822   CO2 28 11/11/2015 1546   GLUCOSE 102 (H) 08/26/2016 0822   GLUCOSE 92 11/11/2015 1546   GLUCOSE 98 09/13/2012 1427   BUN 31 (H) 08/26/2016 0822   BUN 28.7 (H) 11/11/2015 1546   CREATININE 1.41 (H) 08/26/2016 0822   CREATININE 1.3 (H) 11/11/2015 1546   CALCIUM 9.8 08/26/2016 0822   CALCIUM 9.7 11/11/2015 1546   PROT 7.6 08/26/2016 0822   PROT 7.5 11/11/2015 1546   ALBUMIN 3.7 08/26/2016 0822   ALBUMIN 3.8 11/11/2015 1546   AST 12 08/26/2016 0822   AST 18 11/11/2015 1546   ALT 6 08/26/2016 0822   ALT <9 11/11/2015 1546   ALKPHOS 71 08/26/2016 0822   ALKPHOS 80 11/11/2015 1546   BILITOT 0.3 08/26/2016 0822   BILITOT 0.38 11/11/2015 1546   GFRNONAA 39 (L) 07/01/2016 0426   GFRAA 45 (L) 07/01/2016 0426   TB Gold: ordered today  Pregnancy status:  Post-menopause  Vitals:   09/25/16 1517  BP: 112/60  Pulse: 68  Resp: 14   Assessment/Plan: Patient is being initiated on leflunomide (Arava) 10 mg daily.  Patient was counseled on the purpose, proper use, and adverse effects of leflunomide including risk of infection, nausea/diarrhea/weight loss, increase in blood pressure, rash, hair loss, tingling in the hands and feet, and signs and symptoms of interstitial lung disease.  Discussed the importance of frequent monitoring of liver function and blood counts, and patient was provided with instructions for standing labs.  Provided patient with educational materials on leflunomide and answered all questions.  Patient consented to [place] use, and consent will be uploaded into the media tab.  Advised patient to stop the medication and call us if she does develop pedal edema again with this medication.    Elisabeth Most, Pharm.D., BCPS Clinical Pharmacist Pager: [phone] Phone: [phone] 09/25/2016 3:21 PM

## 2016-09-26 LAB — URIC ACID: Uric Acid, Serum: 8 mg/dL — ABNORMAL HIGH (ref 2.5–7.0)

## 2016-09-26 LAB — RHEUMATOID FACTOR

## 2016-09-29 LAB — QUANTIFERON TB GOLD ASSAY (BLOOD)
INTERFERON GAMMA RELEASE ASSAY: NEGATIVE
Mitogen-Nil: >10 IU/mL
QUANTIFERON NIL VALUE: 0.1 [IU]/mL
Quantiferon Tb Ag Minus Nil Value: 0.14 IU/mL

## 2016-09-29 LAB — CYCLIC CITRUL PEPTIDE ANTIBODY, IGG

## 2016-09-29 NOTE — Progress Notes (Signed)
I have not seen her in over 1 year but she completed treatment for her osteomyelitis and has been off treatment as far as I know since last year so no contraindication from an ID standpoint to use biologics, assuming nothing new going on.

## 2016-09-30 LAB — PROTEIN ELECTROPHORESIS, SERUM, WITH REFLEX
ALBUMIN ELP: 3.3 g/dL — AB (ref 3.8–4.8)
ALPHA-2-GLOBULIN: 0.8 g/dL (ref 0.5–0.9)
Alpha-1-Globulin: 0.5 g/dL — ABNORMAL HIGH (ref 0.2–0.3)
BETA 2: 0.4 g/dL (ref 0.2–0.5)
BETA GLOBULIN: 0.4 g/dL (ref 0.4–0.6)
Gamma Globulin: 1.4 g/dL (ref 0.8–1.7)
Total Protein, Serum Electrophoresis: 6.9 g/dL (ref 6.1–8.1)

## 2016-09-30 LAB — IFE INTERPRETATION

## 2016-10-03 LAB — THIOPURINE METHYLTRANSFERASE (TPMT), RBC: Thiopurine Methyltransferase, RBC: 16 nmol/hr/mL RBC

## 2016-10-03 NOTE — Progress Notes (Signed)
Per Dr. Henreitta Leber note she is finished the treatment for osteomyelitis. We can restart on Enbrel has she requested.

## 2016-10-05 NOTE — Addendum Note (Signed)
Addendum  created 10/05/16 1144 by Oleta Mouse, MD   Sign clinical note

## 2016-10-07 ENCOUNTER — Ambulatory Visit (INDEPENDENT_AMBULATORY_CARE_PROVIDER_SITE_OTHER): Payer: Medicare HMO | Admitting: Pharmacist

## 2016-10-07 DIAGNOSIS — Z79899 Other long term (current) drug therapy: Secondary | ICD-10-CM

## 2016-10-07 LAB — COMPLETE METABOLIC PANEL WITHOUT GFR
ALT: 7 U/L (ref 6–29)
AST: 15 U/L (ref 10–35)
Albumin: 3.6 g/dL (ref 3.6–5.1)
Alkaline Phosphatase: 88 U/L (ref 33–130)
BUN: 23 mg/dL (ref 7–25)
CO2: 18 mmol/L — ABNORMAL LOW (ref 20–31)
Calcium: 8.6 mg/dL (ref 8.6–10.4)
Chloride: 104 mmol/L (ref 98–110)
Creat: 1.16 mg/dL — ABNORMAL HIGH (ref 0.60–0.88)
GFR, Est African American: 51 mL/min — ABNORMAL LOW
GFR, Est Non African American: 45 mL/min — ABNORMAL LOW
Glucose, Bld: 109 mg/dL — ABNORMAL HIGH (ref 65–99)
Potassium: 4 mmol/L (ref 3.5–5.3)
Sodium: 137 mmol/L (ref 135–146)
Total Bilirubin: 0.6 mg/dL (ref 0.2–1.2)
Total Protein: 7.1 g/dL (ref 6.1–8.1)

## 2016-10-07 LAB — CBC WITH DIFFERENTIAL/PLATELET
BASOS ABS: 53 {cells}/uL (ref 0–200)
Basophils Relative: 1 %
EOS ABS: 0 {cells}/uL — AB (ref 15–500)
Eosinophils Relative: 0 %
HCT: 32.6 % — ABNORMAL LOW (ref 35.0–45.0)
Hemoglobin: 10.6 g/dL — ABNORMAL LOW (ref 11.7–15.5)
LYMPHS PCT: 11 %
Lymphs Abs: 583 cells/uL — ABNORMAL LOW (ref 850–3900)
MCH: 28.8 pg (ref 27.0–33.0)
MCHC: 32.5 g/dL (ref 32.0–36.0)
MCV: 88.6 fL (ref 80.0–100.0)
MONO ABS: 318 {cells}/uL (ref 200–950)
MONOS PCT: 6 %
MPV: 9.2 fL (ref 7.5–12.5)
NEUTROS PCT: 82 %
Neutro Abs: 4346 cells/uL (ref 1500–7800)
Platelets: 179 10*3/uL (ref 140–400)
RBC: 3.68 MIL/uL — ABNORMAL LOW (ref 3.80–5.10)
RDW: 16.2 % — AB (ref 11.0–15.0)
WBC: 5.3 10*3/uL (ref 3.8–10.8)

## 2016-10-07 MED ORDER — ETANERCEPT 50 MG/ML ~~LOC~~ SOAJ: 50.0000 mg | SUBCUTANEOUS | 0 refills | Status: DC

## 2016-10-07 NOTE — Patient Instructions (Addendum)
We recommend getting a Shingrix vaccine.  Please talk to your primary care provider about this medication.  Etanercept injection What is this medicine? ETANERCEPT (et a [employer]) is used for the treatment of rheumatoid arthritis in adults and children. The medicine is also used to treat psoriatic arthritis, ankylosing spondylitis, and psoriasis. This medicine may be used for other purposes; ask your health care provider or pharmacist if you have questions. COMMON BRAND NAME(S): Enbrel What should I tell my health care provider before I take this medicine? They need to know if you have any of these conditions: -blood disorders -cancer -congestive heart failure -diabetes -exposure to chickenpox -immune system problems -infection -multiple sclerosis -seizure disorder -tuberculosis, a positive skin test for tuberculosis or have recently been in close contact with someone who has tuberculosis -Wegener's granulomatosis -an unusual or allergic reaction to etanercept, latex, other medicines, foods, dyes, or preservatives -pregnant or trying to get pregnant -breast-feeding How should I use this medicine? The medicine is given by injection under the skin. You will be taught how to prepare and give this medicine. Use exactly as directed. Take your medicine at regular intervals. Do not take your medicine more often than directed. It is important that you put your used needles and syringes in a special sharps container. Do not put them in a trash can. If you do not have a sharps container, call your pharmacist or healthcare provider to get one. A special MedGuide will be given to you by the pharmacist with each prescription and refill. Be sure to read this information carefully each time. Talk to your pediatrician regarding the use of this medicine in children. While this drug may be prescribed for children as young as 37 years of age for selected conditions, precautions do apply. Overdosage: If you  think you have taken too much of this medicine contact a poison control center or emergency room at once. NOTE: This medicine is only for you. Do not share this medicine with others. What if I miss a dose? If you miss a dose, contact your health care professional to find out when you should take your next dose. Do not take double or extra doses without advice. What may interact with this medicine? Do not take this medicine with any of the following medications: -anakinra This medicine may also interact with the following medications: -cyclophosphamide -sulfasalazine -vaccines This list may not describe all possible interactions. Give your health care provider a list of all the medicines, herbs, non-prescription drugs, or dietary supplements you use. Also tell them if you smoke, drink alcohol, or use illegal drugs. Some items may interact with your medicine. What should I watch for while using this medicine? Tell your doctor or healthcare professional if your symptoms do not start to get better or if they get worse. You will be tested for tuberculosis (TB) before you start this medicine. If your doctor prescribes any medicine for TB, you should start taking the TB medicine before starting this medicine. Make sure to finish the full course of TB medicine. Call your doctor or health care professional for advice if you get a fever, chills or sore throat, or other symptoms of a cold or flu. Do not treat yourself. This drug decreases your body's ability to fight infections. Try to avoid being around people who are sick. What side effects may I notice from receiving this medicine? Side effects that you should report to your doctor or health care professional as soon as possible: -allergic reactions  like skin rash, itching or hives, swelling of the face, lips, or tongue -changes in vision -fever, chills or any other sign of infection -numbness or tingling in legs or other parts of the body -red, scaly  patches or raised bumps on the skin -shortness of breath or difficulty breathing -swollen lymph nodes in the neck, underarm, or groin areas -unexplained weight loss -unusual bleeding or bruising -unusual swelling or fluid retention in the legs -unusually weak or tired Side effects that usually do not require medical attention (report to your doctor or health care professional if they continue or are bothersome): -dizziness -headache -nausea -redness, itching, or swelling at the injection site -vomiting This list may not describe all possible side effects. Call your doctor for medical advice about side effects. You may report side effects to FDA at 1-800-FDA-1088. Where should I keep my medicine? Keep out of the reach of children. Store between 2 and 8 degrees C (36 and 46 degrees F). Do not freeze or shake. Protect from light. Throw away any unused medicine after the expiration date. You will be instructed on how to store this medicine. NOTE: This sheet is a summary. It may not cover all possible information. If you have questions about this medicine, talk to your doctor, pharmacist, or health care provider.  2018 Elsevier/Gold Standard (2011-10-26 15:33:36)

## 2016-10-07 NOTE — Progress Notes (Signed)
Pharmacy Note  Subjective: Patient presents today to the [facility] for discussion of Enbrel per request of Dr. Bo Merino.  Patient has been cleared by infectious disease to start Enbrel.  Patient seen by the pharmacist for counseling on Enbrel.    Objective: TB Test: negative (09/25/16) Hepatitis panel: negative (11/25/2011) HIV: negative (01/2011)  CBC    Component Value Date/Time   WBC 5.1 08/26/2016 0822   RBC 3.57 (L) 08/26/2016 0822   HGB 10.5 (L) 08/26/2016 0822   HGB 10.6 (L) 11/11/2015 1546   HCT 31.9 (L) 08/26/2016 0822   HCT 32.1 (L) 11/11/2015 1546   HCT 31.4 (L) 11/11/2015 1546   PLT 195.0 08/26/2016 0822   PLT 134 (L) 11/11/2015 1546   MCV 89.4 08/26/2016 0822   MCV 96.1 11/11/2015 1546   MCH 29.0 07/01/2016 0426   MCHC 32.9 08/26/2016 0822   RDW 15.9 (H) 08/26/2016 0822   RDW 13.6 11/11/2015 1546   LYMPHSABS 1.3 08/26/2016 0822   LYMPHSABS 1.3 11/11/2015 1546   MONOABS 0.5 08/26/2016 0822   MONOABS 0.3 11/11/2015 1546   EOSABS 0.2 08/26/2016 0822   EOSABS 0.4 11/11/2015 1546   BASOSABS 0.1 08/26/2016 0822   BASOSABS 0.0 11/11/2015 1546   Assessment/Plan:  Counseled patient that Enbrel is a TNF blocking agent.  Reviewed Enbrel dose of 50 mg once weekly.  Counseled patient on purpose, proper use, and adverse effects of Enbrel.  Reviewed the most common adverse effects including infections, headache, and injection site reactions. Discussed that there is the possibility of an increased risk of malignancy but it is not well understood if this increased risk is due to the medication or the disease state.  Advised patient to get yearly dermatology exams due to risk of skin cancer.  Reviewed the importance of regular labs while on Enbrel therapy.  Counseled patient that Enbrel should be held prior to scheduled surgery.  Counseled patient to avoid live vaccines while on Enbrel.  Advised patient to get annual influenza vaccine and the pneumococcal  vaccine as needed.  Provided patient with medication education material and answered all questions.  Patient voiced understanding.  Patient consented to Enbrel.  Will upload consent into the media tab.  Reviewed storage instructions for Enbrel.  Advised patient that she will need nurse visit for initial Enbrel injection.  Patient voiced understanding.    Patient reports concern about Enbrel cost.  Will apply for Enbrel through patient's insurance.  Also assisted patient in completing Enbrel patient assistance application.  Provider portion was signed by Dr. Estanislado Pandy.  Will fax in Enbrel application, and will update patient once we know status of application.    Patient confirms she started taking leflunomide following visit on 08/26/16.  Patient reports she is tolerating the medication well.  Patient is due for lab work this week.  She requests that labs be drawn today.    Elisabeth Most, Pharm.D., BCPS Clinical Pharmacist Pager: [phone] Phone: [phone] 10/07/2016 3:19 PM

## 2016-10-08 ENCOUNTER — Telehealth: Payer: Self-pay | Admitting: Internal Medicine

## 2016-10-08 ENCOUNTER — Telehealth: Payer: Self-pay

## 2016-10-08 NOTE — Telephone Encounter (Signed)
A prior authorization for Enbrel has been submitted   through cover my meds. Will update once we receive a response.   Chardonay Scritchfield, [place], CPhT 1:45 PM

## 2016-10-08 NOTE — Telephone Encounter (Signed)
The [employer] safety net application has been completed and faxed. Will update once we receive a response.   Michaela Shankel, [place], CPhT 2:22 PM

## 2016-10-08 NOTE — Telephone Encounter (Signed)
The pt would like Lyrica be given to her again, she took this in 2015, she states she cannot move her right hand because of her fibromyalgia.  She would like a call back

## 2016-10-09 LAB — IGG, IGA, IGM
IGA: 196 mg/dL (ref 81–463)
IGG (IMMUNOGLOBIN G), SERUM: 1562 mg/dL (ref 694–1618)
IGM, SERUM: 75 mg/dL (ref 48–271)

## 2016-10-09 MED ORDER — PREGABALIN 25 MG PO CAPS: 25.0000 mg | ORAL_CAPSULE | Freq: Two times a day (BID) | ORAL | 3 refills | Status: DC

## 2016-10-09 NOTE — Telephone Encounter (Signed)
OK. Rx given to dtr

## 2016-10-09 NOTE — Telephone Encounter (Signed)
LM notifying pt

## 2016-10-12 ENCOUNTER — Other Ambulatory Visit: Payer: Self-pay | Admitting: Internal Medicine

## 2016-10-12 NOTE — Telephone Encounter (Signed)
Received a fax from [employer] regarding a prior authorization approval for ENBREL from 05/02/16 to 05/03/17.   Reference number:[id number] Phone number:[phone]  Will send document to scan center.  Ellenie Salome, [place], CPhT  8:40 AM

## 2016-10-13 NOTE — Progress Notes (Signed)
Labs are stable.

## 2016-10-19 ENCOUNTER — Telehealth: Payer: Self-pay

## 2016-10-19 ENCOUNTER — Telehealth: Payer: Self-pay | Admitting: Rheumatology

## 2016-10-19 NOTE — Telephone Encounter (Signed)
Received a fax from [employer] stating that Ms. Gubler has been denied because she appears to be eligible for a low-income subsidy called Extra Help through social security.   Spoke with patient to give her the update. We submitted an application for extra help over the phone. If she is denied extra help we will submit the information to the foundation. If she is approved, we will proceed to have her Rx filled at a retail pharmacy. Patient voiced understanding and denied any questions at this time.   Will send documents to scan center and update once we receive a response.   Drey Shaff, [place], CPhT 11:55 AM

## 2016-10-19 NOTE — Telephone Encounter (Signed)
Spoke to patient to inform her that she should receive a letter in the mail from social security regarding the outcome for her "extra help" application. We will need a copy of the letter to submit to the patient assistance foundation. Patient voiced understanding and denied any questions at this time.   Diane Abbott, [place], CPhT 2:49 PM

## 2016-10-19 NOTE — Telephone Encounter (Signed)
Patient returning your call.

## 2016-10-22 ENCOUNTER — Other Ambulatory Visit: Payer: Self-pay | Admitting: Internal Medicine

## 2016-10-28 ENCOUNTER — Other Ambulatory Visit: Payer: Self-pay | Admitting: Rheumatology

## 2016-10-28 NOTE — Telephone Encounter (Signed)
Patient called requesting a refill on her Fidelis.  She stated that she received samples the last time, which I did not see in her chart.  CB#[phone].  Thank you

## 2016-10-29 MED ORDER — LEFLUNOMIDE 10 MG PO TABS: 10.0000 mg | ORAL_TABLET | Freq: Every day | ORAL | 1 refills | Status: DC

## 2016-10-29 NOTE — Telephone Encounter (Signed)
Last Visit: 09/25/16 Next Visit: 11/16/16 Labs: 10/07/16 c/w previous  OK to refill per Dr. Estanislado Pandy

## 2016-10-30 ENCOUNTER — Ambulatory Visit (INDEPENDENT_AMBULATORY_CARE_PROVIDER_SITE_OTHER): Payer: Medicare HMO | Admitting: Internal Medicine

## 2016-10-30 ENCOUNTER — Encounter: Payer: Self-pay | Admitting: Internal Medicine

## 2016-10-30 VITALS — BP 118/66 | HR 79 | Temp 97.5°F | Ht 62.0 in | Wt 136.0 lb

## 2016-10-30 DIAGNOSIS — R21 Rash and other nonspecific skin eruption: Secondary | ICD-10-CM

## 2016-10-30 MED ORDER — OXYCODONE HCL ER 10 MG PO T12A
10.0000 mg | EXTENDED_RELEASE_TABLET | Freq: Two times a day (BID) | ORAL | 0 refills | Status: DC
Start: 2016-10-30 — End: 2016-12-08

## 2016-10-30 MED ORDER — TRIAMCINOLONE ACETONIDE 0.5 % EX OINT: 1.0000 "application " | TOPICAL_OINTMENT | Freq: Three times a day (TID) | CUTANEOUS | 2 refills | Status: AC

## 2016-10-30 MED ORDER — TRIAMCINOLONE ACETONIDE 0.5 % EX OINT: 1.0000 "application " | TOPICAL_OINTMENT | Freq: Three times a day (TID) | CUTANEOUS | 2 refills | Status: DC

## 2016-10-30 MED ORDER — METHYLPREDNISOLONE ACETATE 80 MG/ML IJ SUSP
80.0000 mg | Freq: Once | INTRAMUSCULAR | Status: AC
  Administered 2016-10-30: 80 mg via INTRAMUSCULAR

## 2016-10-30 MED ORDER — ROPINIROLE HCL 4 MG PO TABS: 4.0000 mg | ORAL_TABLET | Freq: Three times a day (TID) | ORAL | 5 refills | Status: DC

## 2016-10-30 NOTE — Progress Notes (Signed)
Subjective:  Patient ID: Diane Abbott, female    DOB: Dec 21, 1934  Age: 81 y.o. MRN: [id number]  CC: No chief complaint on file.   HPI Diane Abbott presents for L foot swelling and itching and burning rash on L foot and L hand after applying a medicinal balm x 2 weeks F/u RA/FMS C/o RLS - worse  Outpatient Medications Prior to Visit  Medication Sig Dispense Refill  . aspirin EC 81 MG tablet Take 81 mg by mouth at bedtime.     . Cholecalciferol (VITAMIN D3) 2000 units capsule Take 1 capsule (2,000 Units total) by mouth daily. 100 capsule 3  . colchicine 0.6 MG tablet TAKE 1 TABLET BY MOUTH EVERY DAY AS NEEDED FOR FOOT PAIN/GOUT 30 tablet 0  . Cyanocobalamin 1000 MCG SUBL Place 1 tablet (1,000 mcg total) under the tongue daily. 100 tablet 11  . leflunomide (ARAVA) 10 MG tablet Take 1 tablet (10 mg total) by mouth daily. 30 tablet 1  . loratadine (CLARITIN) 10 MG tablet Take 10 mg by mouth daily.    . meclizine (ANTIVERT) 12.5 MG tablet Take 12.5 mg by mouth 3 (three) times daily as needed for dizziness.    Marland Kitchen oxyCODONE (OXYCONTIN) 10 mg 12 hr tablet Take 1 tablet (10 mg total) by mouth 2 (two) times daily. 60 tablet 0  . pregabalin (LYRICA) 25 MG capsule Take 1 capsule (25 mg total) by mouth 2 (two) times daily. 60 capsule 3  . rOPINIRole (REQUIP) 2 MG tablet TAKE 1 TABLET BY MOUTH 3 TIMES A DAY 90 tablet 2  . etanercept (ENBREL SURECLICK) 50 MG/ML injection Inject 0.98 mLs (50 mg total) into the skin once a week. (Patient not taking: Reported on 10/30/2016) 11.76 mL 0   No facility-administered medications prior to visit.     ROS Review of Systems  Constitutional: Positive for fatigue. Negative for activity change, appetite change, chills and unexpected weight change.  HENT: Negative for congestion, mouth sores and sinus pressure.   Eyes: Negative for visual disturbance.  Respiratory: Negative for cough and chest tightness.   Gastrointestinal: Negative for abdominal pain and  nausea.  Genitourinary: Negative for difficulty urinating, frequency and vaginal pain.  Musculoskeletal: Positive for arthralgias, back pain, gait problem and joint swelling.  Skin: Positive for rash and wound. Negative for pallor.  Neurological: Negative for dizziness, tremors, weakness, numbness and headaches.  Psychiatric/Behavioral: Negative for confusion and sleep disturbance.    Objective:  BP 118/66 (BP Location: Right Arm, Patient Position: Sitting, Cuff Size: Normal)   Pulse 79   Temp 97.5 F (36.4 C) (Oral)   Ht 5\' 2"  (1.575 m)   Wt 136 lb (61.7 kg)   SpO2 99%   BMI 24.87 kg/m   BP Readings from Last 3 Encounters:  10/30/16 118/66  09/25/16 112/60  09/16/16 128/72    Wt Readings from Last 3 Encounters:  10/30/16 136 lb (61.7 kg)  09/25/16 134 lb (60.8 kg)  09/16/16 134 lb (60.8 kg)    Physical Exam  Constitutional: She appears well-developed. No distress.  HENT:  Head: Normocephalic.  Right Ear: External ear normal.  Left Ear: External ear normal.  Nose: Nose normal.  Mouth/Throat: Oropharynx is clear and moist.  Eyes: Conjunctivae are normal. Pupils are equal, round, and reactive to light. Right eye exhibits no discharge. Left eye exhibits no discharge.  Neck: Normal range of motion. Neck supple. No JVD present. No tracheal deviation present. No thyromegaly present.  Cardiovascular: Normal rate, regular  rhythm and normal heart sounds.   Pulmonary/Chest: No stridor. No respiratory distress. She has no wheezes.  Abdominal: Soft. Bowel sounds are normal. She exhibits no distension and no mass. There is no tenderness. There is no rebound and no guarding.  Musculoskeletal: She exhibits no edema or tenderness.  Lymphadenopathy:    She has no cervical adenopathy.  Neurological: She displays normal reflexes. No cranial nerve deficit. She exhibits normal muscle tone. Coordination normal.  Skin: Rash noted. No erythema.  Psychiatric: She has a normal mood and affect.  Her behavior is normal. Judgment and thought content normal.  Rash on L hand L foot is swollen, pink No blisters One crack medial from big toe  Lab Results  Component Value Date   WBC 5.3 10/07/2016   HGB 10.6 (L) 10/07/2016   HCT 32.6 (L) 10/07/2016   PLT 179 10/07/2016   GLUCOSE 109 (H) 10/07/2016   ALT 7 10/07/2016   AST 15 10/07/2016   NA 137 10/07/2016   K 4.0 10/07/2016   CL 104 10/07/2016   CREATININE 1.16 (H) 10/07/2016   BUN 23 10/07/2016   CO2 18 (L) 10/07/2016   TSH 2.88 01/09/2011   INR 1.48 06/29/2016   HGBA1C 5.5 06/24/2016    Dg Chest 2 View  Result Date: 07/29/2016 CLINICAL DATA:  Post aortic arch replacement in 06/2016, some shortness of breath EXAM: CHEST  2 VIEW COMPARISON:  07/01/2016 FINDINGS: Normal heart size post median sternotomy and AVR. Calcified tortuous aorta. Mediastinal contours and pulmonary vascularity otherwise normal. Emphysematous changes without infiltrate, pleural effusion, or pneumothorax. Improved aeration in LEFT lung since prior exam. Osseous demineralization with chronic compression deformity of a vertebra at the thoracolumbar junction. LEFT shoulder prosthesis and RIGHT glenohumeral degenerative changes. IMPRESSION: Postsurgical and emphysematous changes without acute infiltrate. Electronically Signed   By: Lavonia Dana M.D.   On: 07/29/2016 15:25    Assessment & Plan:   There are no diagnoses linked to this encounter. I am having Diane Abbott maintain her aspirin EC, Vitamin D3, Cyanocobalamin, meclizine, oxyCODONE, loratadine, etanercept, pregabalin, rOPINIRole, colchicine, and leflunomide.  No orders of the defined types were placed in this encounter.    Follow-up: No Follow-up on file.  Walker Kehr, MD

## 2016-11-05 ENCOUNTER — Telehealth: Payer: Self-pay

## 2016-11-05 NOTE — Telephone Encounter (Signed)
Patient dropped off a pre-decisional notice letter from social security stating that she may not be eligible for extra help because of her income.   Spoke with Anderson Malta from [employer] to see if we could use the pre-decisional document or if we needed to wait on an actual decision in order to proceed with the foundation for assistance. She states that we should send the current notice in to be reviewed. We should have a decision between 5-7 days.   The letter was faxed to [phone] with patient's CASE ID: [id number]  Spoke to the patient to give her the update. We will contact her when we receive a response. She voices understanding and denies any questions at this time.   Huie Ghuman, [place], CPhT 2:34 PM

## 2016-11-06 ENCOUNTER — Ambulatory Visit: Payer: Medicare Other | Admitting: Rheumatology

## 2016-11-10 NOTE — Telephone Encounter (Signed)
I informed patient that she was approved to get Enbrel through the [employer].  Discussed that first dose must be given in clinic.  Patient denies any active infection.  Patient is scheduled for follow up visit on Monday, 11/16/16 at 3:15 PM.  I discussed with Gwenlyn Perking, LPN.  Will try to give initial Enbrel injection at follow up visit if possible.  Sample of Enbrel was set aside for patient.  If not, will schedule patient to come back for a nurse visit.  Patient is ok with that plan.   Elisabeth Most, Pharm.D., BCPS, CPP Clinical Pharmacist Pager: [phone] Phone: [phone] 11/10/2016 11:29 AM

## 2016-11-10 NOTE — Telephone Encounter (Signed)
Called [employer] to check staus of patients application. Spoke with Crystal who states that the extra help denial letter has been received and the application has been processed. The patient has been approved through 2018. Patient will need to contact the foundation at [phone] opt #3 to set up shipment. A letter will be faxed to the clinic and mailed to the patient.  Will send to scan center once received.   Case ID: [id number]  Demetrios Loll, CPhT 10:05 AM

## 2016-11-13 DIAGNOSIS — Z96653 Presence of artificial knee joint, bilateral: Secondary | ICD-10-CM | POA: Insufficient documentation

## 2016-11-13 DIAGNOSIS — M0630 Rheumatoid nodule, unspecified site: Secondary | ICD-10-CM | POA: Insufficient documentation

## 2016-11-13 DIAGNOSIS — Z96612 Presence of left artificial shoulder joint: Secondary | ICD-10-CM | POA: Insufficient documentation

## 2016-11-13 NOTE — Progress Notes (Signed)
Office Visit Note  Patient: Diane Abbott             Date of Birth: [birth year]           MRN: [id number]             PCP: Cassandria Anger, MD Referring: Cassandria Anger, MD Visit Date: 11/16/2016 Occupation: @GUAROCC @    Subjective:  Pain and swelling in hands and feet.   History of Present Illness: Diane Abbott is a 82 y.o. female with history of rheumatoid arthritis some and osteoarthritis overlap. She states she's been having a flare with increased pain and discomfort in her hands feet and ankles. She's been having increased swelling in her bilateral ankles. Left more so than the right. She had clearance to start Enbrel by her infectious disease doctor. She'll be starting today. Her bilateral total knee replacement is doing well. She does have some generalized pain from fibromyalgia. She continues to have discomfort in her elbows and her bilateral trochanteric area and has nocturnal pain in her trochanteric area.  Activities of Daily Living:  Patient reports morning stiffness for hours.   Patient Reports nocturnal pain.  Difficulty dressing/grooming: Denies Difficulty climbing stairs: Reports Difficulty getting out of chair: Reports Difficulty using hands for taps, buttons, cutlery, and/or writing: Reports   Review of Systems  Constitutional: Positive for fatigue. Negative for night sweats, weight gain, weight loss and weakness.  HENT: Negative for mouth sores, trouble swallowing, trouble swallowing, mouth dryness and nose dryness.   Eyes: Negative for pain, redness, visual disturbance and dryness.  Respiratory: Negative for cough, shortness of breath and difficulty breathing.   Cardiovascular: Negative for chest pain, palpitations, hypertension, irregular heartbeat and swelling in legs/feet.  Gastrointestinal: Negative for blood in stool, constipation and diarrhea.  Endocrine: Negative for increased urination.  Genitourinary: Negative for vaginal dryness.    Musculoskeletal: Positive for arthralgias, joint pain, joint swelling and morning stiffness. Negative for myalgias, muscle weakness, muscle tenderness and myalgias.  Skin: Negative for color change, rash, hair loss, skin tightness, ulcers and sensitivity to sunlight.  Allergic/Immunologic: Negative for susceptible to infections.  Neurological: Negative for dizziness, memory loss and night sweats.  Hematological: Negative for swollen glands.  Psychiatric/Behavioral: Positive for sleep disturbance. Negative for depressed mood. The patient is not nervous/anxious.     PMFS History:  Patient Active Problem List   Diagnosis Date Noted  . History of vertebral fracture 11/14/2016  . 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 History of left shoulder replacement 11/13/2016  . History of total knee replacement, bilateral 11/13/2016  . History of discitis and osteomyelitis vertebrae 09/17/2016  . History of DVT (deep vein thrombosis) and PE 09/17/2016  . History of chronic kidney disease 09/17/2016  . History of myeloproliferative disorder 09/17/2016  . History of IBS 09/17/2016  . Foot pain, left 08/26/2016  . H/O aortic arch replacement 07/03/2016  . Thoracic ascending aortic aneurysm ([place]) 06/29/2016  . Upper respiratory infection 06/05/2016  . Anxiety state 01/02/2016  . Callus of foot 12/02/2015  . Leukopenia 11/11/2015  . Diskitis 08/29/2015  . Acute osteomyelitis of lumbar spine ([place]) 07/29/2015  . Thoracic compression fracture ([place]) 07/29/2015  . Discitis of lumbosacral region   . Epidural abscess   . Chronic renal insufficiency, stage III (moderate) 02/28/2015  . Hammer toe, acquired 11/29/2014  . Actinic keratoses 01/25/2014  . Thrombocytopenia due to drugs 11/24/2013  . Knee pain, right anterior 07/25/2013  . Atrophic vaginitis 03/23/2013  . S/P  ascending aortic replacement 10/04/2012  . S/P AVR (aortic valve replacement) 10/04/2012  . Aortic aneurysm, thoracic ([place])  09/14/2012  . Aortic insufficiency 09/14/2012  . Hyperglycemia 07/27/2012  . Wound, open, forearm 03/21/2012  . Osteoarthritis of left shoulder 12/07/2011  . Breast lump in female 04/07/2011  . COPD (chronic obstructive pulmonary disease) ([place]) 03/12/2011  . Esophagitis, unspecified 01/09/2011  . Insomnia 11/04/2010  . RESTLESS LEG SYNDROME 02/03/2010  . THRUSH 07/04/2009  . PRURITUS 05/16/2009  . PULMONARY EMBOLISM 09/14/2008  . Constipation 09/14/2008  . Edema 01/09/2008  . Pneumonia, organism unspecified(486) 07/13/2007  . Chest pain 07/13/2007  . Acute thromboembolism of deep veins of lower extremity ([place]) 07/08/2007  . GERD 07/08/2007  . Irritable bowel syndrome 07/08/2007  . Fibromyalgia 07/08/2007  . CYSTITIS 05/31/2007  . Depression 05/11/2007  . B12 deficiency 03/10/2007  . Vitamin D deficiency 03/10/2007  . OSTEOARTHRITIS 03/10/2007  . Osteopenia 03/10/2007  . VERTIGO 03/10/2007  . PULMONARY EMBOLISM, HX OF 03/10/2007  . Rheumatoid arthritis ([place]) 11/23/2006    Past Medical History:  Diagnosis Date  . Adrenal insufficiency ([place])   . Anemia   . Anginal pain [facility])    "comes and goes has occured since 2012"  . Blood dyscrasia    "problems with platelets, red cells and white cells being low"  . Bronchitis Jan 2013-March 2013   severe  . Cancer ([place])    skin cancer on face  . Cataracts, bilateral    more in left than right  . Depression   . Esophagitis   . Fatty liver   . Fibromyalgia   . Fibromyalgia   . Foot pain, left 08/26/2016   2/18 1st MTP - gout vs other  . GERD (gastroesophageal reflux disease)   . Heart murmur   . History of blood clots 2008   below knee post trauma. Dilated [facility] no clotting disorder felt to be present.  . History of echocardiogram    Echo 11/16: Mild LVH, EF 60-65%, normal wall motion, grade 1 diastolic dysfunction, bioprosthetic AVR with mean gradient 21 mmHg and no regurgitation, proximal aortic arch aneurysm 49 mm,  moderate TR, PASP 31 mmHg  . IBS (irritable bowel syndrome)   . Leaky heart valve   . Osteoarthritis   . Osteoarthritis of left shoulder 12/07/2011  . Osteopenia   . Pulmonary embolism ([place])   . RA (rheumatoid arthritis) ([place])   . Shortness of breath    attributed to aneurysm  . Sliding hiatal hernia   . Urinary leakage    when coughing  . Vertigo    hx of  . Vitamin B 12 deficiency   . Vitamin D deficiency     Family History  Problem Relation Age of Onset  . Ovarian cancer Mother   . Diabetes Mother   . COPD Father   . Heart disease Maternal Grandmother   . Clotting disorder Unknown        Father, brother, sister, and daughter all had deep venous thrombosis  . Breast cancer Unknown   . Pancreatic cancer Paternal Uncle   . Colon cancer Neg Hx    Past Surgical History:  Procedure Laterality Date  . ABDOMINAL HYSTERECTOMY    . AORTA SURGERY  12/2012   Resection of aneurysm and valve replacement  . APPENDECTOMY  1956  . BENTALL PROCEDURE N/A 09/29/2012   Procedure: BENTALL PROCEDURE;  Surgeon: Gaye Pollack, MD;  Location: [facility];  Service: Open Heart Surgery;  Laterality: N/A;  WITH CIRC ARREST  . BILATERAL OOPHORECTOMY    . BREAST BIOPSY Left   . CARDIAC CATHETERIZATION  09/15/12   no PCI  . CATARACT EXTRACTION Bilateral   . CHOLECYSTECTOMY  2003  . COLONOSCOPY    . COSMETIC SURGERY     Tummy tuck  . INTRAOPERATIVE TRANSESOPHAGEAL ECHOCARDIOGRAM N/A 09/29/2012   Procedure: INTRAOPERATIVE TRANSESOPHAGEAL ECHOCARDIOGRAM;  Surgeon: Gaye Pollack, MD;  Location[facility];  Service: Open Heart Surgery;  Laterality: N/A;  . JOINT REPLACEMENT Bilateral   . REPLACEMENT ASCENDING AORTA N/A 06/29/2016   Procedure: AORTIC ARCH REPLACEMENT WITH CIRC ARREST (N/A) - RIGHT AXILLARY CANNULATION  AORTO-AORTIC 28 HEMASHIELD GRAFT WITH AORTO-INNOMINATE AND AORTO-LEFT CAROTID ANASTAMOSIS;  Surgeon: Gaye Pollack, MD;  Location: [facility] OR;  Service: Open Heart Surgery;  Laterality: N/A;  RIGHT  AXILLARY CANNULATION  BILATERAL RADIAL A-LINE  . SKIN CANCER EXCISION Right    cheek  . TEE WITHOUT CARDIOVERSION N/A 06/29/2016   Procedure: TRANSESOPHAGEAL ECHOCARDIOGRAM (TEE);  Surgeon: Gaye Pollack, MD;  Location: [facility];  Service: Open Heart Surgery;  Laterality: N/A;  . TONSILLECTOMY  1941  . TOTAL KNEE ARTHROPLASTY     bilateral  . TOTAL SHOULDER ARTHROPLASTY  12/07/2011   Procedure: TOTAL SHOULDER ARTHROPLASTY;  Surgeon: Johnny Bridge, MD;  Location: [facility];  Service: Orthopedics;  Laterality: Left;  left total shoulder artthroplasty  . WRIST SURGERY     bilateral   Social History   Social History Narrative  . No narrative on file     Objective: Vital Signs: Wt 132 lb 8 oz (60.1 kg)   BMI 24.23 kg/m    Physical Exam  Constitutional: She is oriented to person, place, and time. She appears well-developed and well-nourished.  HENT:  Head: Normocephalic and atraumatic.  Eyes: Conjunctivae and EOM are normal.  Neck: Normal range of motion.  Cardiovascular: Normal rate, regular rhythm and intact distal pulses.   Murmur heard. Pulmonary/Chest: Effort normal and breath sounds normal.  Abdominal: Soft. Bowel sounds are normal.  Lymphadenopathy:    She has no cervical adenopathy.  Neurological: She is alert and oriented to person, place, and time.  Skin: Skin is warm and dry. Capillary refill takes less than 2 seconds.  Psychiatric: She has a normal mood and affect. Her behavior is normal.  Nursing note and vitals reviewed.    Musculoskeletal Exam: C-spine and thoracic lumbar spine limited range of motion with some stiffness. She has thoracic kyphosis. Shoulder joints elbow joints are good range of motion. She has limited range of motion of bilateral wrists joint with synovitis over bilateral wrists joint. She also has synovitis over left first MCP joint. Hip joints knee joints are good range of motion. She has had bilateral total knee replacement which is doing well.  Bilateral ankle joints are swollen with some warmth. She is tenderness across her MTPs.  CDAI Exam: CDAI Homunculus Exam:   Tenderness:  RUE: wrist LUE: wrist RLE: tibiotalar LLE: tibiotalar  Swelling:  RUE: wrist LUE: wrist Left hand: 1st MCP RLE: tibiotalar LLE: tibiotalar  Joint Counts:  CDAI Tender Joint count: 2 CDAI Swollen Joint count: 3  Global Assessments:  Patient Global Assessment: 8 Provider Global Assessment: 8  CDAI Calculated Score: 21    Investigation: Findings:  09/25/2016 negative TB gold   CBC Latest Ref Rng & Units 10/07/2016 08/26/2016 07/01/2016  WBC 3.8 - 10.8 K/uL 5.3 5.1 6.6  Hemoglobin 11.7 - 15.5 g/dL 10.6(L) 10.5(L) 10.1(L)  Hematocrit 35.0 -  45.0 % 32.6(L) 31.9(L) 31.0(L)  Platelets 140 - 400 K/uL 179 195.0 136(L)   CMP Latest Ref Rng & Units 10/07/2016 08/26/2016 07/01/2016  Glucose 65 - 99 mg/dL 109(H) 102(H) 99  BUN 7 - 25 mg/dL 23 31(H) 21(H)  Creatinine 0.60 - 0.88 mg/dL 1.16(H) 1.41(H) 1.27(H)  Sodium 135 - 146 mmol/L 137 138 137  Potassium 3.5 - 5.3 mmol/L 4.0 4.9 3.9  Chloride 98 - 110 mmol/L 104 104 100(L)  CO2 20 - 31 mmol/L 18(L) 28 30  Calcium 8.6 - 10.4 mg/dL 8.6 9.8 8.6(L)  Total Protein 6.1 - 8.1 g/dL 7.1 7.6 -  Total Bilirubin 0.2 - 1.2 mg/dL 0.6 0.3 -  Alkaline Phos 33 - 130 U/L 88 71 -  AST 10 - 35 U/L 15 12 -  ALT 6 - 29 U/L 7 6 -    Imaging: No results found.  Speciality Comments: No specialty comments available.    Procedures:  No procedures performed Allergies: Acetaminophen; Codeine; Pramipexole dihydrochloride; Rofecoxib; Venlafaxine; Arava [leflunomide]; Clonazepam; Diclofenac sodium; Alprazolam; Darvocet [propoxyphene n-acetaminophen]; Duloxetine; Gabapentin; Latex; Morphine and related; Nortriptyline hcl; Penicillins; and Prednisone   Assessment / Plan:     Visit Diagnoses: Rheumatoid arthritis of multiple sites with negative rheumatoid factor (HCC) - severe erosive disease. She's having a flare with  increased pain and swelling in her bilateral wrist joints and bilateral ankle joints. She is here to get her first Enbrel injection. She was given her injection of Enbrel in the office today and she was observed in the office for 30 minutes without any complications.  Rheumatoid nodulosis (HCC)  High risk medication use - will start Enbrel 11/16/16, Arava 10 mg po qd(09/25/2016)Simponi Donalda Ewings was held in January 2017, (inadequate response-Cimzia, Humira, intolerance-MTX& Remicade). We will check her labs in a month then every 3 months to monitor for drug toxicity.  History of left shoulder replacement: Doing well  History of total knee replacement, bilateral: Doing well  Fibromyalgia: She continues to have some generalized pain and discomfort and positive tender points.  Vitamin D deficiency  History of chronic kidney disease:  GFR is low but stable.  History of discitis and osteomyelitis vertebrae - while on Simponi Aria. Patient had clearence from ID to restart BDMARDS.  History of vertebral fracture - Closed compression fracture of thoracic vertebrae  Osteoporosis: Her last DEXA in August 2017 showed T score of -4.0. She recalls being on bisphosphonates in the past and does not want to take them again. I think she will be good candidate for Forteo. Indications side effects contraindications were discussed today. Handout was given. When she reviews those we can apply for the Forteo.  History of DVT (deep vein thrombosis) and PE  History of IBS  History of gastroesophageal reflux (GERD) She has multiple medical problems which are listed as follows:  COPD  S/P AVR (aortic valve replacement)  H/O aortic arch replacement  History of myeloproliferative disorder  Moderate episode of recurrent major depressive disorder ([place])    Orders: No orders of the defined types were placed in this encounter.  No orders of the defined types were placed in this encounter.   Face-to-face time  spent with patient was30 minutes. 50% of time was spent in counseling and coordination of care.  Follow-Up Instructions: Return in about 3 months (around 02/16/2017) for Rheumatoid arthritis.   Bo Merino, MD  Note - This record has been created using Editor, commissioning.  Chart creation errors have been sought, but may not always  have been located. Such creation errors do not reflect on  the standard of medical care.

## 2016-11-14 DIAGNOSIS — Z8781 Personal history of (healed) traumatic fracture: Secondary | ICD-10-CM | POA: Insufficient documentation

## 2016-11-16 ENCOUNTER — Encounter: Payer: Self-pay | Admitting: Rheumatology

## 2016-11-16 ENCOUNTER — Ambulatory Visit (INDEPENDENT_AMBULATORY_CARE_PROVIDER_SITE_OTHER): Payer: Medicare HMO | Admitting: Rheumatology

## 2016-11-16 VITALS — BP 111/57 | HR 82 | Resp 12 | Wt 132.5 lb

## 2016-11-16 DIAGNOSIS — Z8739 Personal history of other diseases of the musculoskeletal system and connective tissue: Secondary | ICD-10-CM

## 2016-11-16 DIAGNOSIS — Z96653 Presence of artificial knee joint, bilateral: Secondary | ICD-10-CM | POA: Diagnosis not present

## 2016-11-16 DIAGNOSIS — Z79899 Other long term (current) drug therapy: Secondary | ICD-10-CM | POA: Diagnosis not present

## 2016-11-16 DIAGNOSIS — M0609 Rheumatoid arthritis without rheumatoid factor, multiple sites: Secondary | ICD-10-CM

## 2016-11-16 DIAGNOSIS — M797 Fibromyalgia: Secondary | ICD-10-CM | POA: Diagnosis not present

## 2016-11-16 DIAGNOSIS — Z87448 Personal history of other diseases of urinary system: Secondary | ICD-10-CM | POA: Diagnosis not present

## 2016-11-16 DIAGNOSIS — Z952 Presence of prosthetic heart valve: Secondary | ICD-10-CM

## 2016-11-16 DIAGNOSIS — M0630 Rheumatoid nodule, unspecified site: Secondary | ICD-10-CM

## 2016-11-16 DIAGNOSIS — Z95828 Presence of other vascular implants and grafts: Secondary | ICD-10-CM

## 2016-11-16 DIAGNOSIS — F331 Major depressive disorder, recurrent, moderate: Secondary | ICD-10-CM

## 2016-11-16 DIAGNOSIS — Z862 Personal history of diseases of the blood and blood-forming organs and certain disorders involving the immune mechanism: Secondary | ICD-10-CM

## 2016-11-16 DIAGNOSIS — J439 Emphysema, unspecified: Secondary | ICD-10-CM

## 2016-11-16 DIAGNOSIS — Z96612 Presence of left artificial shoulder joint: Secondary | ICD-10-CM

## 2016-11-16 DIAGNOSIS — M810 Age-related osteoporosis without current pathological fracture: Secondary | ICD-10-CM | POA: Diagnosis not present

## 2016-11-16 DIAGNOSIS — Z8781 Personal history of (healed) traumatic fracture: Secondary | ICD-10-CM | POA: Diagnosis not present

## 2016-11-16 DIAGNOSIS — Z86718 Personal history of other venous thrombosis and embolism: Secondary | ICD-10-CM | POA: Diagnosis not present

## 2016-11-16 DIAGNOSIS — E559 Vitamin D deficiency, unspecified: Secondary | ICD-10-CM

## 2016-11-16 DIAGNOSIS — Z8719 Personal history of other diseases of the digestive system: Secondary | ICD-10-CM

## 2016-11-16 MED ORDER — ETANERCEPT 50 MG/ML ~~LOC~~ SOAJ
50.0000 mg | Freq: Once | SUBCUTANEOUS | Status: AC
  Administered 2016-11-16: 50 mg via SUBCUTANEOUS

## 2016-11-16 NOTE — Addendum Note (Signed)
Addended by: Carole Binning on: 11/16/2016 04:40 PM   Modules accepted: Orders

## 2016-11-16 NOTE — Progress Notes (Signed)
Pharmacy Note  Subjective:   Patient is being initiated on Enbrel.  Patient was previously counseled extensively on Enbrel on 10/07/16 and consented to initiation of Enbrel at that time.  Patient presents to clinic today to receive the first dose of Enbrel.  Patient denies any active infection.    Objective: CMP     Component Value Date/Time   NA 137 10/07/2016 1601   NA 144 11/11/2015 1546   K 4.0 10/07/2016 1601   K 5.0 11/11/2015 1546   CL 104 10/07/2016 1601   CL 105 09/13/2012 1427   CO2 18 (L) 10/07/2016 1601   CO2 28 11/11/2015 1546   GLUCOSE 109 (H) 10/07/2016 1601   GLUCOSE 92 11/11/2015 1546   GLUCOSE 98 09/13/2012 1427   BUN 23 10/07/2016 1601   BUN 28.7 (H) 11/11/2015 1546   CREATININE 1.16 (H) 10/07/2016 1601   CREATININE 1.3 (H) 11/11/2015 1546   CALCIUM 8.6 10/07/2016 1601   CALCIUM 9.7 11/11/2015 1546   PROT 7.1 10/07/2016 1601   PROT 7.5 11/11/2015 1546   ALBUMIN 3.6 10/07/2016 1601   ALBUMIN 3.8 11/11/2015 1546   AST 15 10/07/2016 1601   AST 18 11/11/2015 1546   ALT 7 10/07/2016 1601   ALT <9 11/11/2015 1546   ALKPHOS 88 10/07/2016 1601   ALKPHOS 80 11/11/2015 1546   BILITOT 0.6 10/07/2016 1601   BILITOT 0.38 11/11/2015 1546   GFRNONAA 45 (L) 10/07/2016 1601   GFRAA 51 (L) 10/07/2016 1601   CBC    Component Value Date/Time   WBC 5.3 10/07/2016 1601   RBC 3.68 (L) 10/07/2016 1601   HGB 10.6 (L) 10/07/2016 1601   HGB 10.6 (L) 11/11/2015 1546   HCT 32.6 (L) 10/07/2016 1601   HCT 32.1 (L) 11/11/2015 1546   HCT 31.4 (L) 11/11/2015 1546   PLT 179 10/07/2016 1601   PLT 134 (L) 11/11/2015 1546   MCV 88.6 10/07/2016 1601   MCV 96.1 11/11/2015 1546   MCH 28.8 10/07/2016 1601   MCHC 32.5 10/07/2016 1601   RDW 16.2 (H) 10/07/2016 1601   RDW 13.6 11/11/2015 1546   LYMPHSABS 583 (L) 10/07/2016 1601   LYMPHSABS 1.3 11/11/2015 1546   MONOABS 318 10/07/2016 1601   MONOABS 0.3 11/11/2015 1546   EOSABS 0 (L) 10/07/2016 1601   EOSABS 0.4 11/11/2015 1546   BASOSABS 53 10/07/2016 1601   BASOSABS 0.0 11/11/2015 1546   TB Gold: negative (09/25/16)  Assessment/Plan:  Reviewed purpose, proper use, and adverse effects of Enbrel with patient.  Patient was counseled on how to administer subcutaneous Enbrel injection using a demonstration pen.  Patient received initial Enbrel injection.  Lot: [id number], Exp. 04/20.  Patient will need standing lab orders in one month.  Provided patient with standing lab instructions and placed standing lab order.  Patient confirms she has already talked to [facility] [employer] and Enbrel is scheduled to be delivered on Wednesday, 11/18/16.  Reviewed storage instructions of Enbrel with patient.    Elisabeth Most, Pharm.D., BCPS Clinical Pharmacist Pager: [phone] Phone: [phone] 11/16/2016 3:36 PM

## 2016-11-16 NOTE — Patient Instructions (Addendum)
Standing Labs We placed an order today for your standing lab work.    Please come back and get your standing labs in August 2018 and every 3 months.  We have open lab Monday through Friday from 8:30-11:30 AM and 1:30-4 PM at the office of Dr. Bo Merino.   The office is located at [address] [place], [place], [place] [address] No appointment is necessary.   Labs are drawn by [facility].  You may receive a bill from [facility] for your lab work. If you have any questions regarding directions or hours of operation,  please call [phone].    Etanercept injection What is this medicine? ETANERCEPT (et a [employer]) is used for the treatment of rheumatoid arthritis in adults and children. The medicine is also used to treat psoriatic arthritis, ankylosing spondylitis, and psoriasis. This medicine may be used for other purposes; ask your health care provider or pharmacist if you have questions. COMMON BRAND NAME(S): Enbrel What should I tell my health care provider before I take this medicine? They need to know if you have any of these conditions: -blood disorders -cancer -congestive heart failure -diabetes -exposure to chickenpox -immune system problems -infection -multiple sclerosis -seizure disorder -tuberculosis, a positive skin test for tuberculosis or have recently been in close contact with someone who has tuberculosis -Wegener's granulomatosis -an unusual or allergic reaction to etanercept, latex, other medicines, foods, dyes, or preservatives -pregnant or trying to get pregnant -breast-feeding How should I use this medicine? The medicine is given by injection under the skin. You will be taught how to prepare and give this medicine. Use exactly as directed. Take your medicine at regular intervals. Do not take your medicine more often than directed. It is important that you put your used needles and syringes in a special sharps container. Do not put them in a trash can. If  you do not have a sharps container, call your pharmacist or healthcare provider to get one. A special MedGuide will be given to you by the pharmacist with each prescription and refill. Be sure to read this information carefully each time. Talk to your pediatrician regarding the use of this medicine in children. While this drug may be prescribed for children as young as 24 years of age for selected conditions, precautions do apply. Overdosage: If you think you have taken too much of this medicine contact a poison control center or emergency room at once. NOTE: This medicine is only for you. Do not share this medicine with others. What if I miss a dose? If you miss a dose, contact your health care professional to find out when you should take your next dose. Do not take double or extra doses without advice. What may interact with this medicine? Do not take this medicine with any of the following medications: -anakinra This medicine may also interact with the following medications: -cyclophosphamide -sulfasalazine -vaccines This list may not describe all possible interactions. Give your health care provider a list of all the medicines, herbs, non-prescription drugs, or dietary supplements you use. Also tell them if you smoke, drink alcohol, or use illegal drugs. Some items may interact with your medicine. What should I watch for while using this medicine? Tell your doctor or healthcare professional if your symptoms do not start to get better or if they get worse. You will be tested for tuberculosis (TB) before you start this medicine. If your doctor prescribes any medicine for TB, you should start taking the TB medicine before starting  this medicine. Make sure to finish the full course of TB medicine. Call your doctor or health care professional for advice if you get a fever, chills or sore throat, or other symptoms of a cold or flu. Do not treat yourself. This drug decreases your body's ability to  fight infections. Try to avoid being around people who are sick. What side effects may I notice from receiving this medicine? Side effects that you should report to your doctor or health care professional as soon as possible: -allergic reactions like skin rash, itching or hives, swelling of the face, lips, or tongue -changes in vision -fever, chills or any other sign of infection -numbness or tingling in legs or other parts of the body -red, scaly patches or raised bumps on the skin -shortness of breath or difficulty breathing -swollen lymph nodes in the neck, underarm, or groin areas -unexplained weight loss -unusual bleeding or bruising -unusual swelling or fluid retention in the legs -unusually weak or tired Side effects that usually do not require medical attention (report to your doctor or health care professional if they continue or are bothersome): -dizziness -headache -nausea -redness, itching, or swelling at the injection site -vomiting This list may not describe all possible side effects. Call your doctor for medical advice about side effects. You may report side effects to FDA at 1-800-FDA-1088. Where should I keep my medicine? Keep out of the reach of children. Store between 2 and 8 degrees C (36 and 46 degrees F). Do not freeze or shake. Protect from light. Throw away any unused medicine after the expiration date. You will be instructed on how to store this medicine. NOTE: This sheet is a summary. It may not cover all possible information. If you have questions about this medicine, talk to your doctor, pharmacist, or health care provider.  2018 Elsevier/Gold Standard (2011-10-26 15:33:36) Teriparatide injection What is this medicine? TERIPARATIDE (terr ih PAR a tyd) increases bone mass and strength. It helps make healthy bone and to slow bone loss. This medicine is used to prevent bone fractures. This medicine may be used for other purposes; ask your health care provider  or pharmacist if you have questions. COMMON BRAND NAME(S): Forteo What should I tell my health care provider before I take this medicine? They need to know if you have any of these conditions: -bone disease other than osteoporosis -high levels of calcium in the blood -history of cancer in the bone -kidney stone -Paget's disease -parathyroid disease -receiving radiation therapy -an unusual or allergic reaction to teriparatide, other medicines, foods, dyes, or preservatives -pregnant or trying to get pregnant -breast-feeding How should I use this medicine? This medicine is for injection under the skin. You will be taught how to prepare and give this medicine. Use exactly as directed. Take your medicine at regular intervals. Do not take your medicine more often than directed. It is important that you put your used needles and pens in a special sharps container. Do not put them in a trash can. If you do not have a sharps container, call your pharmacist or health care provider to get one. A special MedGuide will be given to you by the pharmacist with each prescription and refill. Be sure to read this information carefully each time. Talk to your pediatrician regarding the use of this medicine in children. Special care may be needed. Overdosage: If you think you have taken too much of this medicine contact a poison control center or emergency room at once. NOTE: This  medicine is only for you. Do not share this medicine with others. What if I miss a dose? If you miss a dose, take it as soon as you can. If it is almost time for your next dose, take only that dose. Do not take double or extra doses. What may interact with this medicine? -digoxin This list may not describe all possible interactions. Give your health care provider a list of all the medicines, herbs, non-prescription drugs, or dietary supplements you use. Also tell them if you smoke, drink alcohol, or use illegal drugs. Some items may  interact with your medicine. What should I watch for while using this medicine? Visit your doctor or health care professional for regular checks on your progress. Your doctor may order blood tests and other tests to see how you are doing. You should make sure you get enough calcium and vitamin D while you are taking this medicine, unless your doctor tells you not to. Discuss the foods you eat and the vitamins you take with your health care professional. Dennis Bast may get drowsy or dizzy. Do not drive, use machinery, or do anything that needs mental alertness until you know how this medicine affects you. Do not stand or sit up quickly, especially if you are an older patient. This reduces the risk of dizzy or fainting spells. Talk to your doctor about your risk of cancer. You may be more at risk for certain types of cancers if you take this medicine. What side effects may I notice from receiving this medicine? Side effects that you should report to your doctor or health care professional as soon as possible: -allergic reactions like skin rash, itching or hives, swelling of the face, lips, or tongue -blood in the urine; pain in the lower back or side; pain when urinating -signs and symptoms of low blood pressure like dizziness; feeling faint or lightheaded, falls; unusually weak or tired -signs and symptoms of increased calcium like nausea; vomiting; constipation; low energy; or muscle weakness Side effects that usually do not require medical attention (report these to your doctor or health care professional if they continue or are bothersome): -headache -joint pain -nausea -pain, redness, irritation or swelling at the injection site -stomach upset This list may not describe all possible side effects. Call your doctor for medical advice about side effects. You may report side effects to FDA at 1-800-FDA-1088. Where should I keep my medicine? Keep out of the reach of children. Store the pens in a  refrigerator between 2 and 8 degrees C (36 and 46 degrees F). Do not freeze. Use the pen quickly after taking out of the refrigerator and recap and return to refrigerator right after using. Protect from light. Throw away any unused medicine 28 days after the first injection from the pen. Throw away any unused medicine after the expiration date on the label. NOTE: This sheet is a summary. It may not cover all possible information. If you have questions about this medicine, talk to your doctor, pharmacist, or health care provider.  2018 Elsevier/Gold Standard (2015-09-09 10:23:57)

## 2016-11-16 NOTE — Progress Notes (Signed)
Patient receiving initial Enbrel injection in office today. Patient given Enbrel in right thigh. Patient tolerated injection well. Patient monitored in office for 30 minutes after administration for adverse reactions. No adverse reactions noted.  Administrations This Visit    etanercept (ENBREL) 50 MG/ML injection 50 mg    Admin Date 11/16/2016 Action Given Dose 50 mg Route Subcutaneous Administered By Carole Binning, LPN

## 2016-11-18 ENCOUNTER — Telehealth: Payer: Self-pay | Admitting: Pharmacist

## 2016-11-18 NOTE — Telephone Encounter (Signed)
Patient was given handout on Forteo following visit on 11/16/16.  I called patient to further discuss Forteo.  Patient did not answer.  I left a message asking her to call me back.   Elisabeth Most, Pharm.D., BCPS, CPP Clinical Pharmacist Pager: [phone] Phone: [phone] 11/18/2016 12:20 PM

## 2016-11-19 NOTE — Telephone Encounter (Signed)
Patient was returning your call. CB# is [phone].

## 2016-11-20 NOTE — Telephone Encounter (Signed)
I spoke to patient regarding initiation of parathyroid hormone analog.  Had detailed discussion of osteoporosis and goals of therapy.  Counseled patient on the purpose, proper use, and adverse effects of Forteo.  Discussed maximum use of parathyroid hormone analog of two years and then patient would need to start another osteoporosis medication.  Patient voiced understanding.  Patient denies any questions or concerns at this time.  Will apply for Forteo through patient's insurance.  Will update her once I know status of PA.  Patient is concerned about the cost of Forteo.  Also discussed applying for Forteo patient assistance application.  I mailed patient the patient portion of Forteo patient assistance application.  Patient will work on filling out the application and will mail back or bring it in to the office.    Patient also mentioned her Enbrel shipment was delayed and will not be in until next Tuesday.  She is due for her next Enbrel on Monday.  I offered patient an Enbrel sample and also discussed that a one day delay in therapy would be ok.  Patient reports she will wait for medication to be delivered, but will call us for sample if her medication does not come in on Tuesday as scheduled.    Elisabeth Most, Pharm.D., BCPS, CPP Clinical Pharmacist Pager: [phone] Phone: [phone] 11/20/2016 3:05 PM

## 2016-11-23 ENCOUNTER — Telehealth: Payer: Self-pay | Admitting: Internal Medicine

## 2016-11-23 ENCOUNTER — Telehealth: Payer: Self-pay

## 2016-11-23 NOTE — Telephone Encounter (Signed)
When Dr. Alain Marion changed does from 2mg  to 4mg  he sent in #[phone] with 5 refills. Patient is to take it TID. LM notifying pt to call pharmacy for refill

## 2016-11-23 NOTE — Telephone Encounter (Signed)
Called patient to clarify what biophosphonates she has tried in the past. Patient states she tried fosamax for two years. It was discontinued because she was be written from 2000 to 2004 due to her fibromyalgia. She was not able to sit up and take the medication.   A prior authorization for Forteo has been submitted to patient's insurance via cover my meds. Will update once we receive a response.  Codey Burling, [place], CPhT 12:00 PM

## 2016-11-23 NOTE — Telephone Encounter (Signed)
Patient states Dr. Camila Li increase dosage on ropinirole.  Patient states she is having to still increase the dosage (Pt could not tell me how many she take in a day - believes she is taking one every 6 hours).  Patient states she is out of medication and can not get refill from pharmacy.  Patient requesting script for additional to be [facility]. Patient states she has appt on the 31st with plot.

## 2016-11-24 NOTE — Telephone Encounter (Signed)
Received a fax from [place] regarding a prior authorization approval for Forteo from 05/02/16 to 05/02/17.   Reference number: [facility] Phone number: [phone]  Will send document to scan center.  Robertine Kipper, [place], CPhT  10:03 AM

## 2016-11-25 ENCOUNTER — Ambulatory Visit: Payer: Medicare HMO | Admitting: Internal Medicine

## 2016-12-01 ENCOUNTER — Ambulatory Visit: Payer: Medicare HMO | Admitting: Internal Medicine

## 2016-12-03 ENCOUNTER — Telehealth: Payer: Self-pay | Admitting: Internal Medicine

## 2016-12-03 ENCOUNTER — Telehealth: Payer: Self-pay

## 2016-12-03 NOTE — Telephone Encounter (Signed)
Called pt to schedule AWV. Left vm for pt to call office to schedule appt.

## 2016-12-03 NOTE — Telephone Encounter (Signed)
I made several attempts to reach patient to discuss Forteo approval, medication cost, and application for the [facility] Patient Assistance Program.  I left a message asking patient to call me back and sent patient a my chart message asking her to call back.   Elisabeth Most, Pharm.D., BCPS, CPP Clinical Pharmacist Pager: [phone] Phone: [phone] 12/03/2016 12:13 PM

## 2016-12-03 NOTE — Telephone Encounter (Signed)
Patient is on the list for [employer] 2018 and may be a good candidate for an AWV. Please let me know if/when appt is scheduled.   

## 2016-12-08 ENCOUNTER — Ambulatory Visit (INDEPENDENT_AMBULATORY_CARE_PROVIDER_SITE_OTHER): Payer: Medicare HMO | Admitting: Internal Medicine

## 2016-12-08 ENCOUNTER — Encounter: Payer: Self-pay | Admitting: Internal Medicine

## 2016-12-08 VITALS — BP 142/86 | HR 77 | Temp 97.9°F | Ht 62.0 in | Wt 132.0 lb

## 2016-12-08 DIAGNOSIS — R079 Chest pain, unspecified: Secondary | ICD-10-CM

## 2016-12-08 DIAGNOSIS — R609 Edema, unspecified: Secondary | ICD-10-CM | POA: Diagnosis not present

## 2016-12-08 DIAGNOSIS — N183 Chronic kidney disease, stage 3 unspecified: Secondary | ICD-10-CM

## 2016-12-08 DIAGNOSIS — E559 Vitamin D deficiency, unspecified: Secondary | ICD-10-CM

## 2016-12-08 MED ORDER — OXYCODONE HCL ER 10 MG PO T12A: 10.0000 mg | EXTENDED_RELEASE_TABLET | Freq: Two times a day (BID) | ORAL | 0 refills | Status: DC

## 2016-12-08 MED ORDER — RANITIDINE HCL 300 MG PO TABS: 300.0000 mg | ORAL_TABLET | Freq: Every day | ORAL | 11 refills | Status: DC

## 2016-12-08 NOTE — Assessment & Plan Note (Signed)
Labs are monitored

## 2016-12-08 NOTE — Assessment & Plan Note (Signed)
Re-start Vit D 

## 2016-12-08 NOTE — Assessment & Plan Note (Addendum)
?  GERD Nexium/Ranitidine EKG Do not lift heavy cat food bags

## 2016-12-08 NOTE — Progress Notes (Signed)
Subjective:  Patient ID: Diane Abbott, female    DOB: 1934-05-31  Age: 82 y.o. MRN: [id number]  CC: No chief complaint on file.   HPI Diane Abbott presents for chronic pain, B12, RLS f/u C/o CP and GERD sx's off and on x 2 weeks  Outpatient Medications Prior to Visit  Medication Sig Dispense Refill  . aspirin EC 81 MG tablet Take 81 mg by mouth at bedtime.     . Cholecalciferol (VITAMIN D3) 2000 units capsule Take 1 capsule (2,000 Units total) by mouth daily. 100 capsule 3  . colchicine 0.6 MG tablet TAKE 1 TABLET BY MOUTH EVERY DAY AS NEEDED FOR FOOT PAIN/GOUT 30 tablet 0  . Cyanocobalamin 1000 MCG SUBL Place 1 tablet (1,000 mcg total) under the tongue daily. 100 tablet 11  . etanercept (ENBREL SURECLICK) 50 MG/ML injection Inject 0.98 mLs (50 mg total) into the skin once a week. 11.76 mL 0  . loratadine (CLARITIN) 10 MG tablet Take 10 mg by mouth daily.    . meclizine (ANTIVERT) 12.5 MG tablet Take 12.5 mg by mouth 3 (three) times daily as needed for dizziness.    Marland Kitchen oxyCODONE (OXYCONTIN) 10 mg 12 hr tablet Take 1 tablet (10 mg total) by mouth 2 (two) times daily. 60 tablet 0  . rOPINIRole (REQUIP) 4 MG tablet Take 1 tablet (4 mg total) by mouth 3 (three) times daily. 90 tablet 5  . triamcinolone ointment (KENALOG) 0.5 % Apply 1 application topically 3 (three) times daily. 90 g 2  . leflunomide (ARAVA) 10 MG tablet Take 1 tablet (10 mg total) by mouth daily. 30 tablet 1  . pregabalin (LYRICA) 25 MG capsule Take 1 capsule (25 mg total) by mouth 2 (two) times daily. (Patient taking differently: Take 25 mg by mouth daily. ) 60 capsule 3   No facility-administered medications prior to visit.     ROS Review of Systems  Constitutional: Positive for fatigue. Negative for activity change, appetite change, chills and unexpected weight change.  HENT: Negative for congestion, mouth sores and sinus pressure.   Eyes: Negative for visual disturbance.  Respiratory: Positive for chest  tightness. Negative for cough.   Cardiovascular: Positive for chest pain.  Gastrointestinal: Negative for abdominal pain and nausea.  Genitourinary: Negative for difficulty urinating, frequency and vaginal pain.  Musculoskeletal: Positive for arthralgias and back pain. Negative for gait problem.  Skin: Negative for pallor and rash.  Neurological: Negative for dizziness, tremors, weakness, numbness and headaches.  Psychiatric/Behavioral: Negative for confusion and sleep disturbance.    Objective:  BP (!) 142/86 (BP Location: Left Arm, Patient Position: Sitting, Cuff Size: Normal)   Pulse 77   Temp 97.9 F (36.6 C) (Oral)   Ht 5\' 2"  (1.575 m)   Wt 132 lb (59.9 kg)   SpO2 98%   BMI 24.14 kg/m   BP Readings from Last 3 Encounters:  12/08/16 (!) 142/86  11/16/16 (!) 111/57  10/30/16 118/66    Wt Readings from Last 3 Encounters:  12/08/16 132 lb (59.9 kg)  11/16/16 132 lb 8 oz (60.1 kg)  10/30/16 136 lb (61.7 kg)    Physical Exam  Constitutional: She appears well-developed. No distress.  HENT:  Head: Normocephalic.  Right Ear: External ear normal.  Left Ear: External ear normal.  Nose: Nose normal.  Mouth/Throat: Oropharynx is clear and moist.  Eyes: Pupils are equal, round, and reactive to light. Conjunctivae are normal. Right eye exhibits no discharge. Left eye exhibits no discharge.  Neck: Normal range of motion. Neck supple. No JVD present. No tracheal deviation present. No thyromegaly present.  Cardiovascular: Normal rate, regular rhythm and normal heart sounds.   Pulmonary/Chest: No stridor. No respiratory distress. She has no wheezes.  Abdominal: Soft. Bowel sounds are normal. She exhibits no distension and no mass. There is no tenderness. There is no rebound and no guarding.  Musculoskeletal: She exhibits tenderness. She exhibits no edema.  Lymphadenopathy:    She has no cervical adenopathy.  Neurological: She displays normal reflexes. No cranial nerve deficit. She  exhibits normal muscle tone. Coordination normal.  Skin: No rash noted. No erythema.  Psychiatric: She has a normal mood and affect. Her behavior is normal. Judgment and thought content normal.   Procedure: EKG Indication: chest pain Impression: NSR. No acute/new changes.  Lab Results  Component Value Date   WBC 5.3 10/07/2016   HGB 10.6 (L) 10/07/2016   HCT 32.6 (L) 10/07/2016   PLT 179 10/07/2016   GLUCOSE 109 (H) 10/07/2016   ALT 7 10/07/2016   AST 15 10/07/2016   NA 137 10/07/2016   K 4.0 10/07/2016   CL 104 10/07/2016   CREATININE 1.16 (H) 10/07/2016   BUN 23 10/07/2016   CO2 18 (L) 10/07/2016   TSH 2.88 01/09/2011   INR 1.48 06/29/2016   HGBA1C 5.5 06/24/2016    Dg Chest 2 View  Result Date: 07/29/2016 CLINICAL DATA:  Post aortic arch replacement in 06/2016, some shortness of breath EXAM: CHEST  2 VIEW COMPARISON:  07/01/2016 FINDINGS: Normal heart size post median sternotomy and AVR. Calcified tortuous aorta. Mediastinal contours and pulmonary vascularity otherwise normal. Emphysematous changes without infiltrate, pleural effusion, or pneumothorax. Improved aeration in LEFT lung since prior exam. Osseous demineralization with chronic compression deformity of a vertebra at the thoracolumbar junction. LEFT shoulder prosthesis and RIGHT glenohumeral degenerative changes. IMPRESSION: Postsurgical and emphysematous changes without acute infiltrate. Electronically Signed   By: Lavonia Dana M.D.   On: 07/29/2016 15:25    Assessment & Plan:   There are no diagnoses linked to this encounter. I have discontinued Ms. Batterton's pregabalin and leflunomide. I am also having her maintain her aspirin EC, Vitamin D3, Cyanocobalamin, meclizine, loratadine, etanercept, colchicine, rOPINIRole, oxyCODONE, and triamcinolone ointment.  No orders of the defined types were placed in this encounter.    Follow-up: No Follow-up on file.  Walker Kehr, MD

## 2016-12-08 NOTE — Assessment & Plan Note (Signed)
Better  

## 2016-12-10 ENCOUNTER — Telehealth: Payer: Self-pay | Admitting: Internal Medicine

## 2016-12-10 NOTE — Telephone Encounter (Signed)
Pt called in and wants to(OXYCONTIN) time release called in, she does not want the oxycodone.  She wants the name brand.  She has not yet pick up junes yet?  How can she get this changed ?

## 2016-12-10 NOTE — Telephone Encounter (Signed)
She was given oxycontin Thx

## 2016-12-11 ENCOUNTER — Telehealth: Payer: Self-pay | Admitting: Pharmacist

## 2016-12-11 NOTE — Telephone Encounter (Signed)
I called Diane Abbott to follow up about Forteo.  She confirms she cannot afford Forteo copay.  Patient confirms she received the Forteo patient assistance application that I mailed to her.  She reports she has not taken the time to fill it out yet.  She will look into application.  Advised her she can either mail it back or bring it in the office and we can submit application.   Patient reports she is having some injection site reaction (redness and itching) with Enbrel that is controlled with diphenhydramine.  She reports it is tolerable.  Noted patient was recently prescribed ranitidine which may also help with symptoms.  Discussed use of loratadine instead of diphenhydramine as it will be less sedating.  Also advised use of topical hydrocortisone to help with itching.    Patient also reports she has stopped her leflunomide due to thinning of her hair.  She does not want to take this medication anymore.   Elisabeth Most, Pharm.D., BCPS, CPP Clinical Pharmacist Pager: [phone] Phone: [phone] 12/11/2016 10:25 AM

## 2016-12-16 NOTE — Telephone Encounter (Signed)
Pt called stating that her insurance has approved for her to have Oxycodone based on what Dr Plotnkiov sent in last time. She wanted to know if something could be sent over stating that she is needing Oxycontin so that they can fill that for her.

## 2016-12-21 NOTE — Telephone Encounter (Signed)
Spoke with the pharmacy and patient was given was Oxycontin and there is nothing we to send over.

## 2017-02-01 ENCOUNTER — Encounter: Payer: Self-pay | Admitting: Internal Medicine

## 2017-02-01 ENCOUNTER — Ambulatory Visit (INDEPENDENT_AMBULATORY_CARE_PROVIDER_SITE_OTHER): Payer: Medicare HMO | Admitting: Internal Medicine

## 2017-02-01 DIAGNOSIS — E538 Deficiency of other specified B group vitamins: Secondary | ICD-10-CM

## 2017-02-01 DIAGNOSIS — N183 Chronic kidney disease, stage 3 unspecified: Secondary | ICD-10-CM

## 2017-02-01 DIAGNOSIS — I712 Thoracic aortic aneurysm, without rupture: Secondary | ICD-10-CM

## 2017-02-01 DIAGNOSIS — R69 Illness, unspecified: Secondary | ICD-10-CM | POA: Diagnosis not present

## 2017-02-01 DIAGNOSIS — F5101 Primary insomnia: Secondary | ICD-10-CM | POA: Diagnosis not present

## 2017-02-01 DIAGNOSIS — M0609 Rheumatoid arthritis without rheumatoid factor, multiple sites: Secondary | ICD-10-CM

## 2017-02-01 DIAGNOSIS — N952 Postmenopausal atrophic vaginitis: Secondary | ICD-10-CM | POA: Diagnosis not present

## 2017-02-01 DIAGNOSIS — G43909 Migraine, unspecified, not intractable, without status migrainosus: Secondary | ICD-10-CM | POA: Insufficient documentation

## 2017-02-01 DIAGNOSIS — G43109 Migraine with aura, not intractable, without status migrainosus: Secondary | ICD-10-CM

## 2017-02-01 DIAGNOSIS — G2581 Restless legs syndrome: Secondary | ICD-10-CM | POA: Diagnosis not present

## 2017-02-01 DIAGNOSIS — I7121 Aneurysm of the ascending aorta, without rupture: Secondary | ICD-10-CM

## 2017-02-01 MED ORDER — OXYCODONE HCL ER 10 MG PO T12A: 10.0000 mg | EXTENDED_RELEASE_TABLET | Freq: Two times a day (BID) | ORAL | 0 refills | Status: DC

## 2017-02-01 MED ORDER — ESTRADIOL 10 MCG VA TABS: ORAL_TABLET | VAGINAL | 5 refills | Status: DC

## 2017-02-01 MED ORDER — SUMATRIPTAN SUCCINATE 50 MG PO TABS: ORAL_TABLET | ORAL | 3 refills | Status: DC

## 2017-02-01 NOTE — Assessment & Plan Note (Signed)
Imitrex helped in the past: Rx

## 2017-02-01 NOTE — Assessment & Plan Note (Signed)
On B12 

## 2017-02-01 NOTE — Assessment & Plan Note (Signed)
Off meds for now

## 2017-02-01 NOTE — Assessment & Plan Note (Signed)
Labs

## 2017-02-01 NOTE — Assessment & Plan Note (Signed)
s/p replacement of her aneurysmal aortic arch with aorto-innominate and aorto-left carotid artery bypass

## 2017-02-01 NOTE — Progress Notes (Signed)
Subjective:  Patient ID: Diane Abbott, female    DOB: 1934-07-17  Age: 82 y.o. MRN: [id number]  CC: No chief complaint on file.   HPI FELISA ZECHMAN presents for chronic pain, CFS, sleep problems f/u. C/o HAs: Imitrex helped in the past  Outpatient Medications Prior to Visit  Medication Sig Dispense Refill  . aspirin EC 81 MG tablet Take 81 mg by mouth at bedtime.     . Cholecalciferol (VITAMIN D3) 2000 units capsule Take 1 capsule (2,000 Units total) by mouth daily. 100 capsule 3  . colchicine 0.6 MG tablet TAKE 1 TABLET BY MOUTH EVERY DAY AS NEEDED FOR FOOT PAIN/GOUT 30 tablet 0  . Cyanocobalamin 1000 MCG SUBL Place 1 tablet (1,000 mcg total) under the tongue daily. 100 tablet 11  . etanercept (ENBREL SURECLICK) 50 MG/ML injection Inject 0.98 mLs (50 mg total) into the skin once a week. 11.76 mL 0  . loratadine (CLARITIN) 10 MG tablet Take 10 mg by mouth daily.    . meclizine (ANTIVERT) 12.5 MG tablet Take 12.5 mg by mouth 3 (three) times daily as needed for dizziness.    Marland Kitchen oxyCODONE (OXYCONTIN) 10 mg 12 hr tablet Take 1 tablet (10 mg total) by mouth 2 (two) times daily. 60 tablet 0  . ranitidine (ZANTAC) 300 MG tablet Take 1 tablet (300 mg total) by mouth at bedtime. 30 tablet 11  . rOPINIRole (REQUIP) 4 MG tablet Take 1 tablet (4 mg total) by mouth 3 (three) times daily. 90 tablet 5  . triamcinolone ointment (KENALOG) 0.5 % Apply 1 application topically 3 (three) times daily. 90 g 2   No facility-administered medications prior to visit.     ROS Review of Systems  Constitutional: Positive for fatigue. Negative for activity change, appetite change, chills and unexpected weight change.  HENT: Negative for congestion, mouth sores and sinus pressure.   Eyes: Negative for visual disturbance.  Respiratory: Negative for cough and chest tightness.   Gastrointestinal: Negative for abdominal pain and nausea.  Genitourinary: Negative for difficulty urinating, frequency and vaginal  pain.  Musculoskeletal: Positive for arthralgias and back pain. Negative for gait problem.  Skin: Negative for pallor and rash.  Neurological: Negative for dizziness, tremors, weakness, numbness and headaches.  Psychiatric/Behavioral: Positive for dysphoric mood and sleep disturbance. Negative for confusion and suicidal ideas. The patient is nervous/anxious.     Objective:  BP 136/80 (BP Location: Left Arm, Patient Position: Sitting, Cuff Size: Normal)   Pulse 74   Temp 98.1 F (36.7 C) (Oral)   Ht 5\' 2"  (1.575 m)   Wt 140 lb (63.5 kg)   SpO2 98%   BMI 25.61 kg/m   BP Readings from Last 3 Encounters:  02/01/17 136/80  12/08/16 (!) 142/86  11/16/16 (!) 111/57    Wt Readings from Last 3 Encounters:  02/01/17 140 lb (63.5 kg)  12/08/16 132 lb (59.9 kg)  11/16/16 132 lb 8 oz (60.1 kg)    Physical Exam  Constitutional: She appears well-developed. No distress.  HENT:  Head: Normocephalic.  Right Ear: External ear normal.  Left Ear: External ear normal.  Nose: Nose normal.  Mouth/Throat: Oropharynx is clear and moist.  Eyes: Pupils are equal, round, and reactive to light. Conjunctivae are normal. Right eye exhibits no discharge. Left eye exhibits no discharge.  Neck: Normal range of motion. Neck supple. No JVD present. No tracheal deviation present. No thyromegaly present.  Cardiovascular: Normal rate, regular rhythm and normal heart sounds.   Pulmonary/Chest:  No stridor. No respiratory distress. She has no wheezes.  Abdominal: Soft. Bowel sounds are normal. She exhibits no distension and no mass. There is no tenderness. There is no rebound and no guarding.  Musculoskeletal: She exhibits tenderness. She exhibits no edema.  Lymphadenopathy:    She has no cervical adenopathy.  Neurological: She displays normal reflexes. No cranial nerve deficit. She exhibits normal muscle tone. Coordination normal.  Skin: No rash noted. No erythema.  Psychiatric: She has a normal mood and  affect. Her behavior is normal. Judgment and thought content normal.  chest palpable thrill  Lab Results  Component Value Date   WBC 5.3 10/07/2016   HGB 10.6 (L) 10/07/2016   HCT 32.6 (L) 10/07/2016   PLT 179 10/07/2016   GLUCOSE 109 (H) 10/07/2016   ALT 7 10/07/2016   AST 15 10/07/2016   NA 137 10/07/2016   K 4.0 10/07/2016   CL 104 10/07/2016   CREATININE 1.16 (H) 10/07/2016   BUN 23 10/07/2016   CO2 18 (L) 10/07/2016   TSH 2.88 01/09/2011   INR 1.48 06/29/2016   HGBA1C 5.5 06/24/2016    Dg Chest 2 View  Result Date: 07/29/2016 CLINICAL DATA:  Post aortic arch replacement in 06/2016, some shortness of breath EXAM: CHEST  2 VIEW COMPARISON:  07/01/2016 FINDINGS: Normal heart size post median sternotomy and AVR. Calcified tortuous aorta. Mediastinal contours and pulmonary vascularity otherwise normal. Emphysematous changes without infiltrate, pleural effusion, or pneumothorax. Improved aeration in LEFT lung since prior exam. Osseous demineralization with chronic compression deformity of a vertebra at the thoracolumbar junction. LEFT shoulder prosthesis and RIGHT glenohumeral degenerative changes. IMPRESSION: Postsurgical and emphysematous changes without acute infiltrate. Electronically Signed   By: Lavonia Dana M.D.   On: 07/29/2016 15:25    Assessment & Plan:   There are no diagnoses linked to this encounter. I am having Ms. Gaudin maintain her aspirin EC, Vitamin D3, Cyanocobalamin, meclizine, loratadine, etanercept, colchicine, rOPINIRole, triamcinolone ointment, oxyCODONE, and ranitidine.  No orders of the defined types were placed in this encounter.    Follow-up: No Follow-up on file.  Walker Kehr, MD

## 2017-02-01 NOTE — Assessment & Plan Note (Signed)
Oxycodone helps

## 2017-02-01 NOTE — Assessment & Plan Note (Signed)
On Embrel Oxycodone

## 2017-02-01 NOTE — Assessment & Plan Note (Signed)
Vagifem vag tabs

## 2017-02-03 ENCOUNTER — Telehealth: Payer: Self-pay

## 2017-02-03 NOTE — Telephone Encounter (Signed)
Called [employer] foundation to check the process of the 0962 renewal application. Spoke to a representative who states that each patient will be required to submit a full application along with a prior authorization letter. Financial documents are optional, however, may be required later on. Application can be faxed starting on 02/02/2017 for processing. Anything before will be automatically denied. We can fax the application to the foundation for the patient in the clinic.   Called patient to inform. Patient's next scheduled appointment is 02/18/2017. She plans fill out and sign application for submission. She is also interested in completing her patient assistance application for Forteo. We will complete both and fax applications once completed. Will update once we receive a response.   Tavious Griesinger, [place], CPhT  9:00 AM

## 2017-02-05 ENCOUNTER — Telehealth: Payer: Self-pay | Admitting: Rheumatology

## 2017-02-05 NOTE — Telephone Encounter (Signed)
Patient left a message stating she needs a refill on Enbrel sent into pharmacy.

## 2017-02-08 MED ORDER — ETANERCEPT 50 MG/ML ~~LOC~~ SOAJ: 50.0000 mg | SUBCUTANEOUS | 0 refills | Status: DC

## 2017-02-08 NOTE — Progress Notes (Signed)
Office Visit Note  Patient: Diane Abbott             Date of Birth: Mar 25, 1935           MRN: [id number]             PCP: Cassandria Anger, MD Referring: Cassandria Anger, MD Visit Date: 02/18/2017 Occupation: @GUAROCC @    Subjective:  Rheumatoid Arthritis (Right ankle pain, swelling, difficulty walking/steps)   History of Present Illness: Diane Abbott is a 82 y.o. female with history of seronegative rheumatoid arthritis. She was started on Enbrel last visit. She states she has noticed improvement in most of her joints on Enbrel. She still continues to have some discomfort and pain in her right ankle. She's having difficulty climbing stairs. She still have some swelling in her left foot. She is still having discomfort in her trochanteric area. She has fibromyalgia which is flaring as well. Her right shoulder joint is better after the cortisone injection. Her left total shoulder replacement is doing well. She also has bilateral knee joint replacement which is doing well.  Activities of Daily Living:  Patient reports morning stiffness for 5 minutes.   Patient Reports nocturnal pain.  Difficulty dressing/grooming: Denies Difficulty climbing stairs: Reports Difficulty getting out of chair: Reports Difficulty using hands for taps, buttons, cutlery, and/or writing: Reports   Review of Systems  Constitutional: Positive for fatigue. Negative for night sweats, weight gain, weight loss and weakness.  HENT: Negative for mouth sores, trouble swallowing, trouble swallowing, mouth dryness and nose dryness.   Eyes: Positive for dryness. Negative for pain, redness and visual disturbance.  Respiratory: Negative for cough, shortness of breath and difficulty breathing.   Cardiovascular: Positive for hypertension. Negative for chest pain, palpitations, irregular heartbeat and swelling in legs/feet.  Gastrointestinal: Positive for constipation and diarrhea. Negative for blood in stool.      History of IBS  Endocrine: Negative for increased urination.  Genitourinary: Negative for vaginal dryness.  Musculoskeletal: Positive for arthralgias, joint pain, joint swelling and morning stiffness. Negative for myalgias, muscle weakness, muscle tenderness and myalgias.  Skin: Negative for color change, rash, hair loss, skin tightness, ulcers and sensitivity to sunlight.  Allergic/Immunologic: Negative for susceptible to infections.  Neurological: Positive for memory loss. Negative for dizziness and night sweats.       History of migraines  Hematological: Negative for swollen glands.  Psychiatric/Behavioral: Positive for sleep disturbance. Negative for depressed mood. The patient is nervous/anxious.     PMFS History:  Patient Active Problem List   Diagnosis Date Noted  . Migraines 02/01/2017  . History of vertebral fracture 11/14/2016  . 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 History of left shoulder replacement 11/13/2016  . History of total knee replacement, bilateral 11/13/2016  . History of discitis and osteomyelitis vertebrae 09/17/2016  . History of DVT (deep vein thrombosis) and PE 09/17/2016  . History of chronic kidney disease 09/17/2016  . History of myeloproliferative disorder 09/17/2016  . History of IBS 09/17/2016  . Foot pain, left 08/26/2016  . H/O aortic arch replacement 07/03/2016  . Thoracic ascending aortic aneurysm ([place]) 06/29/2016  . Upper respiratory infection 06/05/2016  . Anxiety state 01/02/2016  . Callus of foot 12/02/2015  . Leukopenia 11/11/2015  . Diskitis 08/29/2015  . Acute osteomyelitis of lumbar spine ([place]) 07/29/2015  . Thoracic compression fracture ([place]) 07/29/2015  . Discitis of lumbosacral region   . Epidural abscess   . Chronic renal insufficiency, stage III (moderate) (HCC)  02/28/2015  . Hammer toe, acquired 11/29/2014  . Actinic keratoses 01/25/2014  . Thrombocytopenia due to drugs 11/24/2013  . Knee pain, right anterior  07/25/2013  . Atrophic vaginitis 03/23/2013  . S/P ascending aortic replacement 10/04/2012  . S/P AVR (aortic valve replacement) 10/04/2012  . Aortic aneurysm, thoracic ([place]) 09/14/2012  . Aortic insufficiency 09/14/2012  . Hyperglycemia 07/27/2012  . Wound, open, forearm 03/21/2012  . Osteoarthritis of left shoulder 12/07/2011  . Breast lump in female 04/07/2011  . COPD (chronic obstructive pulmonary disease) ([place]) 03/12/2011  . Esophagitis, unspecified 01/09/2011  . Insomnia 11/04/2010  . RESTLESS LEG SYNDROME 02/03/2010  . THRUSH 07/04/2009  . PRURITUS 05/16/2009  . PULMONARY EMBOLISM 09/14/2008  . Constipation 09/14/2008  . Edema 01/09/2008  . Pneumonia, organism unspecified(486) 07/13/2007  . Chest pain 07/13/2007  . Acute thromboembolism of deep veins of lower extremity ([place]) 07/08/2007  . GERD 07/08/2007  . Irritable bowel syndrome 07/08/2007  . Fibromyalgia 07/08/2007  . CYSTITIS 05/31/2007  . Depression 05/11/2007  . B12 deficiency 03/10/2007  . Vitamin D deficiency 03/10/2007  . OSTEOARTHRITIS 03/10/2007  . Osteopenia 03/10/2007  . VERTIGO 03/10/2007  . PULMONARY EMBOLISM, HX OF 03/10/2007  . Rheumatoid arthritis ([place]) 11/23/2006    Past Medical History:  Diagnosis Date  . Adrenal insufficiency ([place])   . Anemia   . Anginal pain [facility])    "comes and goes has occured since 2012"  . Blood dyscrasia    "problems with platelets, red cells and white cells being low"  . Bronchitis Jan 2013-March 2013   severe  . Cancer ([place])    skin cancer on face  . Cataracts, bilateral    more in left than right  . Depression   . Esophagitis   . Fatty liver   . Fibromyalgia   . Fibromyalgia   . Foot pain, left 08/26/2016   2/18 1st MTP - gout vs other  . GERD (gastroesophageal reflux disease)   . Heart murmur   . History of blood clots 2008   below knee post trauma. Dilated [facility] no clotting disorder felt to be present.  . History of echocardiogram    Echo  11/16: Mild LVH, EF 60-65%, normal wall motion, grade 1 diastolic dysfunction, bioprosthetic AVR with mean gradient 21 mmHg and no regurgitation, proximal aortic arch aneurysm 49 mm, moderate TR, PASP 31 mmHg  . IBS (irritable bowel syndrome)   . Leaky heart valve   . Osteoarthritis   . Osteoarthritis of left shoulder 12/07/2011  . Osteopenia   . Pulmonary embolism ([place])   . RA (rheumatoid arthritis) ([place])   . Shortness of breath    attributed to aneurysm  . Sliding hiatal hernia   . Urinary leakage    when coughing  . Vertigo    hx of  . Vitamin B 12 deficiency   . Vitamin D deficiency     Family History  Problem Relation Age of Onset  . Ovarian cancer Mother   . Diabetes Mother   . COPD Father   . Heart disease Maternal Grandmother   . Clotting disorder Unknown        Father, brother, sister, and daughter all had deep venous thrombosis  . Breast cancer Unknown   . Pancreatic cancer Paternal Uncle   . Colon cancer Neg Hx    Past Surgical History:  Procedure Laterality Date  . ABDOMINAL HYSTERECTOMY    . AORTA SURGERY  12/2012   Resection of aneurysm and valve replacement  .  APPENDECTOMY  1956  . BENTALL PROCEDURE N/A 09/29/2012   Procedure: BENTALL PROCEDURE;  Surgeon: Gaye Pollack, MD;  Location: [facility];  Service: Open Heart Surgery;  Laterality: N/A;  WITH CIRC ARREST  . BILATERAL OOPHORECTOMY    . BREAST BIOPSY Left   . CARDIAC CATHETERIZATION  09/15/12   no PCI  . CATARACT EXTRACTION Bilateral   . CHOLECYSTECTOMY  2003  . COLONOSCOPY    . COSMETIC SURGERY     Tummy tuck  . INTRAOPERATIVE TRANSESOPHAGEAL ECHOCARDIOGRAM N/A 09/29/2012   Procedure: INTRAOPERATIVE TRANSESOPHAGEAL ECHOCARDIOGRAM;  Surgeon: Gaye Pollack, MD;  Location: [facility];  Service: Open Heart Surgery;  Laterality: N/A;  . JOINT REPLACEMENT Bilateral   . REPLACEMENT ASCENDING AORTA N/A 06/29/2016   Procedure: AORTIC ARCH REPLACEMENT WITH CIRC ARREST (N/A) - RIGHT AXILLARY CANNULATION  AORTO-AORTIC 28  HEMASHIELD GRAFT WITH AORTO-INNOMINATE AND AORTO-LEFT CAROTID ANASTAMOSIS;  Surgeon: Gaye Pollack, MD;  Location: [facility] OR;  Service: Open Heart Surgery;  Laterality: N/A;  RIGHT AXILLARY CANNULATION  BILATERAL RADIAL A-LINE  . SKIN CANCER EXCISION Right    cheek  . TEE WITHOUT CARDIOVERSION N/A 06/29/2016   Procedure: TRANSESOPHAGEAL ECHOCARDIOGRAM (TEE);  Surgeon: Gaye Pollack, MD;  Location: [facility];  Service: Open Heart Surgery;  Laterality: N/A;  . TONSILLECTOMY  1941  . TOTAL KNEE ARTHROPLASTY     bilateral  . TOTAL SHOULDER ARTHROPLASTY  12/07/2011   Procedure: TOTAL SHOULDER ARTHROPLASTY;  Surgeon: Johnny Bridge, MD;  Location: [place] [facility];  Service: Orthopedics;  Laterality: Left;  left total shoulder artthroplasty  . WRIST SURGERY     bilateral   Social History   Social History Narrative  . No narrative on file     Objective: Vital Signs: BP (!) 117/58 (BP Location: Left Arm, Patient Position: Sitting, Cuff Size: Normal)   Pulse 78   Resp 18   Ht 5\' 1"  (1.549 m)   Wt 142 lb (64.4 kg)   BMI 26.83 kg/m    Physical Exam  Constitutional: She is oriented to person, place, and time. She appears well-developed and well-nourished.  HENT:  Head: Normocephalic and atraumatic.  Eyes: Conjunctivae and EOM are normal.  Neck: Normal range of motion.  Cardiovascular: Normal rate, regular rhythm, normal heart sounds and intact distal pulses.   Pulmonary/Chest: Effort normal and breath sounds normal.  Abdominal: Soft. Bowel sounds are normal.  Lymphadenopathy:    She has no cervical adenopathy.  Neurological: She is alert and oriented to person, place, and time.  Skin: Skin is warm and dry. Capillary refill takes less than 2 seconds.  Psychiatric: She has a normal mood and affect. Her behavior is normal.  Nursing note and vitals reviewed.    Musculoskeletal Exam: C-spine good range of motion. She is some thoracic kyphosis. Lumbar spine limited range of motion. Shoulder joints elbow  joints are good range of motion. She has limited range of motion of bilateral wrists joint. She has bilateral extensor T no synovitis. No synovitis was noted over her MCPs or PIPs. She has DIP PIP thickening with subluxation. Hip joints are good range of motion. Her bilateral total knee replacements are doing well. She is some swelling over her right ankle joint and some tenderness across her MTP joints.  CDAI Exam: CDAI Homunculus Exam:   Tenderness:  RLE: tibiotalar Left foot: 1st MTP  Swelling:  RLE: tibiotalar Left foot: 1st MTP  Joint Counts:  CDAI Tender Joint count: 0 CDAI Swollen Joint count: 0  Global Assessments:  Patient Global Assessment: 4 Provider Global Assessment: 4  CDAI Calculated Score: 8    Investigation: No additional findings.TB Gold: Negative in 09/2016 CBC Latest Ref Rng & Units 10/07/2016 08/26/2016 07/01/2016  WBC 3.8 - 10.8 K/uL 5.3 5.1 6.6  Hemoglobin 11.7 - 15.5 g/dL 10.6(L) 10.5(L) 10.1(L)  Hematocrit 35.0 - 45.0 % 32.6(L) 31.9(L) 31.0(L)  Platelets 140 - 400 K/uL 179 195.0 136(L)   CMP Latest Ref Rng & Units 10/07/2016 08/26/2016 07/01/2016  Glucose 65 - 99 mg/dL 109(H) 102(H) 99  BUN 7 - 25 mg/dL 23 31(H) 21(H)  Creatinine 0.60 - 0.88 mg/dL 1.16(H) 1.41(H) 1.27(H)  Sodium 135 - 146 mmol/L 137 138 137  Potassium 3.5 - 5.3 mmol/L 4.0 4.9 3.9  Chloride 98 - 110 mmol/L 104 104 100(L)  CO2 20 - 31 mmol/L 18(L) 28 30  Calcium 8.6 - 10.4 mg/dL 8.6 9.8 8.6(L)  Total Protein 6.1 - 8.1 g/dL 7.1 7.6 -  Total Bilirubin 0.2 - 1.2 mg/dL 0.6 0.3 -  Alkaline Phos 33 - 130 U/L 88 71 -  AST 10 - 35 U/L 15 12 -  ALT 6 - 29 U/L 7 6 -    Imaging: No results found.  Speciality Comments: No specialty comments available.    Procedures:  No procedures performed Allergies: Acetaminophen; Codeine; Pramipexole dihydrochloride; Rofecoxib; Venlafaxine; Arava [leflunomide]; Clonazepam; Diclofenac sodium; Alprazolam; Darvocet [propoxyphene n-acetaminophen];  Duloxetine; Gabapentin; Latex; Morphine and related; Nortriptyline hcl; Penicillins; and Prednisone   Assessment / Plan:     Visit Diagnoses: Rheumatoid arthritis of multiple sites with negative rheumatoid factor (HCC) - severe erosive disease. Patient is arthritis appears much better compared to last visit on Enbrel. She still have some extensor tenosynovitis in her hands and mild arthritis in her ankle and feet. I would like to give more time  to see response from Enbrel.  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 Simponi Donalda Ewings was held in January 2017 due to discitis, (inadequate response-Cimzia, Humira, intolerance-MTX& Remicade). Her labs are past-due. We'll check her labs today and then every 3 months to monitor for drug toxicity.  History of left shoulder replacement: Doing well  History of total knee replacement, bilateral: Doing well  Fibromyalgia: She continues to have some generalized pain and discomfort from fibromyalgia.  Vitamin D deficiency: She is on supplement  History of chronic kidney disease: Her labs have been stable.  History of discitis - osteomyelitis vertebrae - while on Simponi Aria. Patient had clearence from ID to restart BDMARDS.  History of vertebral fracture - Closed compression fracture of thoracic vertebrae  Other osteoporosis without current pathological fracture - Her last DEXA in August 2017 showed T score of -4.0. She recalls being on bisphosphonates in the past and does not want to take them again. At this point she does not want any treatment for osteoporosis.  Her other medical problems are listed as follows:  History of DVT (deep vein thrombosis) - and PE  History of IBS  History of gastroesophageal reflux (GERD)  History of COPD  S/P AVR (aortic valve replacement)  H/O aortic arch replacement  History of myeloproliferative disorder  Moderate episode of recurrent major  depressive disorder ([place])    Orders: No orders of the defined types were placed in this encounter.  No orders of the defined types were placed in this encounter.   Follow-Up Instructions: No Follow-up on file.   Bo Merino, MD  Note -  This record has been created using [employer].  Chart creation errors have been sought, but may not always  have been located. Such creation errors do not reflect on  the standard of medical care.

## 2017-02-08 NOTE — Telephone Encounter (Signed)
Last Visit: 11/16/16 Next Visit: 02/18/17 Labs: 10/07/16 stable TB Gold: 09/25/16 Neg  Patient will update labs at her appointment.   Okay to refill per Dr. Estanislado Pandy

## 2017-02-08 NOTE — Telephone Encounter (Signed)
Patient called and needs refill of Enbrel sent in. She is also requesting that Chasta call her please.

## 2017-02-09 NOTE — Telephone Encounter (Signed)
Returned patient's call. Did not get an answer. Left message.   Aksh Swart, [place], CPhT 1:27 PM

## 2017-02-18 ENCOUNTER — Encounter: Payer: Self-pay | Admitting: Rheumatology

## 2017-02-18 ENCOUNTER — Ambulatory Visit (INDEPENDENT_AMBULATORY_CARE_PROVIDER_SITE_OTHER): Payer: Medicare HMO | Admitting: Rheumatology

## 2017-02-18 VITALS — BP 117/58 | HR 78 | Resp 18 | Ht 61.0 in | Wt 142.0 lb

## 2017-02-18 DIAGNOSIS — M0630 Rheumatoid nodule, unspecified site: Secondary | ICD-10-CM | POA: Diagnosis not present

## 2017-02-18 DIAGNOSIS — T50905A Adverse effect of unspecified drugs, medicaments and biological substances, initial encounter: Secondary | ICD-10-CM

## 2017-02-18 DIAGNOSIS — Z8781 Personal history of (healed) traumatic fracture: Secondary | ICD-10-CM

## 2017-02-18 DIAGNOSIS — F331 Major depressive disorder, recurrent, moderate: Secondary | ICD-10-CM

## 2017-02-18 DIAGNOSIS — Z87448 Personal history of other diseases of urinary system: Secondary | ICD-10-CM

## 2017-02-18 DIAGNOSIS — Z95828 Presence of other vascular implants and grafts: Secondary | ICD-10-CM | POA: Diagnosis not present

## 2017-02-18 DIAGNOSIS — M797 Fibromyalgia: Secondary | ICD-10-CM

## 2017-02-18 DIAGNOSIS — E559 Vitamin D deficiency, unspecified: Secondary | ICD-10-CM | POA: Diagnosis not present

## 2017-02-18 DIAGNOSIS — Z79899 Other long term (current) drug therapy: Secondary | ICD-10-CM

## 2017-02-18 DIAGNOSIS — M0609 Rheumatoid arthritis without rheumatoid factor, multiple sites: Secondary | ICD-10-CM

## 2017-02-18 DIAGNOSIS — Z8739 Personal history of other diseases of the musculoskeletal system and connective tissue: Secondary | ICD-10-CM

## 2017-02-18 DIAGNOSIS — Z86718 Personal history of other venous thrombosis and embolism: Secondary | ICD-10-CM

## 2017-02-18 DIAGNOSIS — M818 Other osteoporosis without current pathological fracture: Secondary | ICD-10-CM

## 2017-02-18 DIAGNOSIS — Z8719 Personal history of other diseases of the digestive system: Secondary | ICD-10-CM

## 2017-02-18 DIAGNOSIS — Z952 Presence of prosthetic heart valve: Secondary | ICD-10-CM | POA: Diagnosis not present

## 2017-02-18 DIAGNOSIS — Z862 Personal history of diseases of the blood and blood-forming organs and certain disorders involving the immune mechanism: Secondary | ICD-10-CM

## 2017-02-18 DIAGNOSIS — Z96612 Presence of left artificial shoulder joint: Secondary | ICD-10-CM

## 2017-02-18 DIAGNOSIS — Z8709 Personal history of other diseases of the respiratory system: Secondary | ICD-10-CM

## 2017-02-18 DIAGNOSIS — Z96653 Presence of artificial knee joint, bilateral: Secondary | ICD-10-CM

## 2017-02-18 DIAGNOSIS — D6959 Other secondary thrombocytopenia: Secondary | ICD-10-CM

## 2017-02-18 LAB — COMPLETE METABOLIC PANEL WITH GFR
AG Ratio: 1.3 (calc) (ref 1.0–2.5)
ALBUMIN MSPROF: 4 g/dL (ref 3.6–5.1)
ALT: 7 U/L (ref 6–29)
AST: 16 U/L (ref 10–35)
Alkaline phosphatase (APISO): 73 U/L (ref 33–130)
BILIRUBIN TOTAL: 0.4 mg/dL (ref 0.2–1.2)
BUN / CREAT RATIO: 19 (calc) (ref 6–22)
BUN: 26 mg/dL — ABNORMAL HIGH (ref 7–25)
CHLORIDE: 105 mmol/L (ref 98–110)
CO2: 30 mmol/L (ref 20–32)
Calcium: 9.1 mg/dL (ref 8.6–10.4)
Creat: 1.35 mg/dL — ABNORMAL HIGH (ref 0.60–0.88)
GFR, EST AFRICAN AMERICAN: 43 mL/min/{1.73_m2} — AB (ref >=60)
GFR, Est Non African American: 37 mL/min/{1.73_m2} — ABNORMAL LOW (ref >=60)
GLOBULIN: 3.1 g/dL (ref 1.9–3.7)
Glucose, Bld: 90 mg/dL (ref 65–99)
POTASSIUM: 5.2 mmol/L (ref 3.5–5.3)
Sodium: 141 mmol/L (ref 135–146)
TOTAL PROTEIN: 7.1 g/dL (ref 6.1–8.1)

## 2017-02-18 LAB — CBC WITH DIFFERENTIAL/PLATELET
BASOS ABS: 40 {cells}/uL (ref 0–200)
BASOS PCT: 1.1 %
EOS ABS: 162 {cells}/uL (ref 15–500)
EOS PCT: 4.5 %
HCT: 35.4 % (ref 35.0–45.0)
Hemoglobin: 11.9 g/dL (ref 11.7–15.5)
Lymphs Abs: 1454 cells/uL (ref 850–3900)
MCH: 31 pg (ref 27.0–33.0)
MCHC: 33.6 g/dL (ref 32.0–36.0)
MCV: 92.2 fL (ref 80.0–100.0)
MONOS PCT: 12.3 %
MPV: 9.9 fL (ref 7.5–12.5)
Neutro Abs: 1501 cells/uL (ref 1500–7800)
Neutrophils Relative %: 41.7 %
Platelets: 132 10*3/uL — ABNORMAL LOW (ref 140–400)
RBC: 3.84 10*6/uL (ref 3.80–5.10)
RDW: 13.6 % (ref 11.0–15.0)
Total Lymphocyte: 40.4 %
WBC mixed population: 443 cells/uL (ref 200–950)
WBC: 3.6 10*3/uL — ABNORMAL LOW (ref 3.8–10.8)

## 2017-02-18 NOTE — Patient Instructions (Signed)
Standing Labs We placed an order today for your standing lab work.    Please come back and get your standing labs in January and every 3 months  We have open lab Monday through Friday from 8:30-11:30 AM and 1:30-4 PM at the office of Dr. Majesta Leichter.   The office is located at [address], [address], [place], [place] [address] No appointment is necessary.   Labs are drawn by [facility].  You may receive a bill from [facility] for your lab work. If you have any questions regarding directions or hours of operation,  please call [phone].    

## 2017-02-18 NOTE — Progress Notes (Signed)
There is decrease in WBCs and platelets. Please advise her to hold next Enbrel dose and then change to every other week. Her GFR has decreased. Please ask patient if she is taking any NSAIDs. If she is taking and states she should discontinue those. Repeat CBC and BMP with GFR in 3 weeks.

## 2017-02-26 ENCOUNTER — Encounter: Payer: Self-pay | Admitting: *Deleted

## 2017-03-02 NOTE — Telephone Encounter (Signed)
Received a fax from [employer] stating that the patient has been approved to receive medication free of charge from 05/04/2017 through 05/03/2018.   Will send documents to scan center.   Called patient to update. Left message with foundation phone number.   Shawntina Diffee, [place], CPhT 11:40 AM

## 2017-03-08 ENCOUNTER — Telehealth: Payer: Self-pay

## 2017-03-08 NOTE — Telephone Encounter (Signed)
Application for assistance was submitted on 02/26/17.   Called foundation to check the states of application. Spoke to Shepherd who states that the foundation is working on application received on 10/24-25 currently. She should start to process applications from the [employer] later this week. Will check back and update once we receive a response.   Lowery Paullin 11:00 AM

## 2017-03-10 MED ORDER — TERIPARATIDE (RECOMBINANT) 600 MCG/2.4ML ~~LOC~~ SOLN: 20.0000 ug | Freq: Every day | SUBCUTANEOUS | 2 refills | Status: DC

## 2017-03-10 NOTE — Telephone Encounter (Signed)
Received a fax from [facility] stating that the patient is missing documents from her application. She will need to send in copies of her Tax returns with her name on it and they will need a new Rx sent because [place] is a specialty medication and the pharmacy will require one.   Patient brought her financial documents with her yesterday to be sent with it. Please print a new Rx for Forteo for the patient to be faxed to foundation. Thanks!  Will fax documents once complete and update once we receive a response.   Chelsea Pedretti, [place], CPhT 2:59 PM

## 2017-03-10 NOTE — Addendum Note (Signed)
Addended by: Carole Binning on: 03/10/2017 03:09 PM   Modules accepted: Orders

## 2017-03-10 NOTE — Telephone Encounter (Signed)
Prescription printed

## 2017-03-24 NOTE — Telephone Encounter (Signed)
Attempted to contact the patient and left message for patient to call the office.  

## 2017-03-24 NOTE — Telephone Encounter (Signed)
Called foundation to check the status of patients application. Spoke with Caryl Pina who states that the patient has been approved from 03/19/2017 through 05/03/2017. She will be required to submit an renewal application for 3496. An approval letter will be faxed to the clinic. They will reach out to the patient to schedule a shipment. She advise the patient to reach out to them. After the third attempt at they will deactivate the refill.   Called patient to update. She will need to consent to [facility] and schedule a nursing visit for first dose.   Deverick Pruss, [place], CPhT 10:46 AM

## 2017-03-24 NOTE — Telephone Encounter (Signed)
Patient returned call. Gave her the number to the foundation. She will contact them to get set up in the system. She will have the first dose delivered to the clinic. Once we receive the medication, we will contact her to set up a nursing visit. Patient will need to consent and submit a renewal application. Patient voices understanding and denies any questions.   Patient has questions about her lab work. She is requesting a call back. Thanks!  Aroush Chasse, [place], CPhT 11:23 AM

## 2017-04-01 ENCOUNTER — Telehealth: Payer: Self-pay

## 2017-04-01 NOTE — Telephone Encounter (Signed)
Diane Abbott, was calling to schedule delivery for patient.  Cb# is [phone].  Please advise.  Thank you.

## 2017-04-01 NOTE — Telephone Encounter (Signed)
Attempted to contact the patient and left message for patient to call the office.  

## 2017-04-01 NOTE — Telephone Encounter (Signed)
Spoke with Diane Abbott, verified address for delivery of medication on April 06, 2017 via [employer].

## 2017-04-05 ENCOUNTER — Ambulatory Visit (INDEPENDENT_AMBULATORY_CARE_PROVIDER_SITE_OTHER): Payer: Medicare HMO | Admitting: Internal Medicine

## 2017-04-05 ENCOUNTER — Encounter: Payer: Self-pay | Admitting: Internal Medicine

## 2017-04-05 ENCOUNTER — Other Ambulatory Visit (INDEPENDENT_AMBULATORY_CARE_PROVIDER_SITE_OTHER): Payer: Medicare HMO

## 2017-04-05 DIAGNOSIS — M0609 Rheumatoid arthritis without rheumatoid factor, multiple sites: Secondary | ICD-10-CM

## 2017-04-05 DIAGNOSIS — M797 Fibromyalgia: Secondary | ICD-10-CM | POA: Diagnosis not present

## 2017-04-05 DIAGNOSIS — M8589 Other specified disorders of bone density and structure, multiple sites: Secondary | ICD-10-CM | POA: Diagnosis not present

## 2017-04-05 DIAGNOSIS — L659 Nonscarring hair loss, unspecified: Secondary | ICD-10-CM | POA: Diagnosis not present

## 2017-04-05 DIAGNOSIS — I712 Thoracic aortic aneurysm, without rupture, unspecified: Secondary | ICD-10-CM

## 2017-04-05 DIAGNOSIS — E559 Vitamin D deficiency, unspecified: Secondary | ICD-10-CM | POA: Diagnosis not present

## 2017-04-05 DIAGNOSIS — E538 Deficiency of other specified B group vitamins: Secondary | ICD-10-CM

## 2017-04-05 LAB — HEPATIC FUNCTION PANEL
ALT: 9 U/L (ref 0–35)
AST: 19 U/L (ref 0–37)
Albumin: 4.4 g/dL (ref 3.5–5.2)
Alkaline Phosphatase: 85 U/L (ref 39–117)
BILIRUBIN DIRECT: 0.1 mg/dL (ref 0.0–0.3)
BILIRUBIN TOTAL: 0.5 mg/dL (ref 0.2–1.2)
Total Protein: 8.1 g/dL (ref 6.0–8.3)

## 2017-04-05 LAB — BASIC METABOLIC PANEL
BUN: 34 mg/dL — AB (ref 6–23)
CALCIUM: 10 mg/dL (ref 8.4–10.5)
CO2: 25 mEq/L (ref 19–32)
CREATININE: 1.35 mg/dL — AB (ref 0.40–1.20)
Chloride: 104 mEq/L (ref 96–112)
GFR: 40.02 mL/min — AB (ref >60.00)
Glucose, Bld: 108 mg/dL — ABNORMAL HIGH (ref 70–99)
POTASSIUM: 5.1 meq/L (ref 3.5–5.1)
Sodium: 138 mEq/L (ref 135–145)

## 2017-04-05 LAB — IBC PANEL
Iron: 103 ug/dL (ref 42–145)
SATURATION RATIOS: 24.1 % (ref 20.0–50.0)
Transferrin: 305 mg/dL (ref 212.0–360.0)

## 2017-04-05 LAB — CBC WITH DIFFERENTIAL/PLATELET
BASOS PCT: 2.2 % (ref 0.0–3.0)
Basophils Absolute: 0.1 10*3/uL (ref 0.0–0.1)
EOS ABS: 0.2 10*3/uL (ref 0.0–0.7)
Eosinophils Relative: 5 % (ref 0.0–5.0)
HCT: 38.4 % (ref 36.0–46.0)
Hemoglobin: 12.7 g/dL (ref 12.0–15.0)
LYMPHS PCT: 47.5 % — AB (ref 12.0–46.0)
Lymphs Abs: 1.7 10*3/uL (ref 0.7–4.0)
MCHC: 33.2 g/dL (ref 30.0–36.0)
MCV: 95.8 fl (ref 78.0–100.0)
Monocytes Absolute: 0.5 10*3/uL (ref 0.1–1.0)
Monocytes Relative: 12.9 % — ABNORMAL HIGH (ref 3.0–12.0)
NEUTROS ABS: 1.1 10*3/uL — AB (ref 1.4–7.7)
Neutrophils Relative %: 32.4 % — ABNORMAL LOW (ref 43.0–77.0)
PLATELETS: 147 10*3/uL — AB (ref 150.0–400.0)
RBC: 4.01 Mil/uL (ref 3.87–5.11)
RDW: 13 % (ref 11.5–15.5)
WBC: 3.5 10*3/uL — ABNORMAL LOW (ref 4.0–10.5)

## 2017-04-05 LAB — VITAMIN D 25 HYDROXY (VIT D DEFICIENCY, FRACTURES): VITD: 32.95 ng/mL (ref 30.00–100.00)

## 2017-04-05 LAB — CORTISOL: CORTISOL PLASMA: 7.1 ug/dL

## 2017-04-05 MED ORDER — OXYCODONE HCL ER 10 MG PO T12A: 10.0000 mg | EXTENDED_RELEASE_TABLET | Freq: Two times a day (BID) | ORAL | 0 refills | Status: DC

## 2017-04-05 MED ORDER — FINASTERIDE 5 MG PO TABS: ORAL_TABLET | ORAL | 11 refills | Status: DC

## 2017-04-05 NOTE — Assessment & Plan Note (Signed)
CTD On pain meds

## 2017-04-05 NOTE — Progress Notes (Signed)
Subjective:  Patient ID: Diane Abbott, female    DOB: 19-Feb-1935  Age: 82 y.o. MRN: [id number]  CC: No chief complaint on file.   HPI Diane Abbott presents for chronic pain, RA/CTD HAs f/u. C/o hair loss  Outpatient Medications Prior to Visit  Medication Sig Dispense Refill  . aspirin EC 81 MG tablet Take 81 mg by mouth at bedtime.     . Cholecalciferol (VITAMIN D3) 2000 units capsule Take 1 capsule (2,000 Units total) by mouth daily. 100 capsule 3  . Cyanocobalamin 1000 MCG SUBL Place 1 tablet (1,000 mcg total) under the tongue daily. 100 tablet 11  . Estradiol (VAGIFEM) 10 MCG TABS vaginal tablet INSERT ONE TABLET VAGINALLY EVERY 2 (TWO) DAYS 15 tablet 5  . etanercept (ENBREL SURECLICK) 50 MG/ML injection Inject 0.98 mLs (50 mg total) into the skin once a week. 11.76 mL 0  . loratadine (CLARITIN) 10 MG tablet Take 10 mg by mouth daily.    . meclizine (ANTIVERT) 12.5 MG tablet Take 12.5 mg by mouth 3 (three) times daily as needed for dizziness.    Marland Kitchen oxyCODONE (OXYCONTIN) 10 mg 12 hr tablet Take 1 tablet (10 mg total) by mouth 2 (two) times daily. 60 tablet 0  . ranitidine (ZANTAC) 300 MG tablet Take 1 tablet (300 mg total) by mouth at bedtime. (Patient taking differently: Take 300 mg by mouth as needed. ) 30 tablet 11  . rOPINIRole (REQUIP) 4 MG tablet Take 1 tablet (4 mg total) by mouth 3 (three) times daily. 90 tablet 5  . SUMAtriptan (IMITREX) 50 MG tablet 1 po qd prn migraine 10 tablet 3  . Teriparatide, Recombinant, 600 MCG/2.4ML SOLN Inject 0.08 mLs (20 mcg total) daily into the skin. 2.24 mL 2  . triamcinolone ointment (KENALOG) 0.5 % Apply 1 application topically 3 (three) times daily. 90 g 2  . colchicine 0.6 MG tablet TAKE 1 TABLET BY MOUTH EVERY DAY AS NEEDED FOR FOOT PAIN/GOUT (Patient not taking: Reported on 02/18/2017) 30 tablet 0   No facility-administered medications prior to visit.     ROS Review of Systems  Constitutional: Negative for activity change,  appetite change, chills, fatigue and unexpected weight change.  HENT: Negative for congestion, mouth sores and sinus pressure.   Eyes: Negative for visual disturbance.  Respiratory: Negative for cough and chest tightness.   Gastrointestinal: Negative for abdominal pain and nausea.  Genitourinary: Negative for difficulty urinating, frequency and vaginal pain.  Musculoskeletal: Positive for arthralgias and back pain. Negative for gait problem.  Skin: Negative for pallor and rash.  Neurological: Negative for dizziness, tremors, weakness, numbness and headaches.  Psychiatric/Behavioral: Negative for confusion and sleep disturbance. The patient is nervous/anxious.     Objective:  BP 134/82 (BP Location: Left Arm, Patient Position: Sitting, Cuff Size: Normal)   Pulse 75   Temp 97.7 F (36.5 C) (Oral)   Ht 5\' 1"  (1.549 m)   Wt 146 lb (66.2 kg)   SpO2 99%   BMI 27.59 kg/m   BP Readings from Last 3 Encounters:  04/05/17 134/82  02/18/17 (!) 117/58  02/01/17 136/80    Wt Readings from Last 3 Encounters:  04/05/17 146 lb (66.2 kg)  02/18/17 142 lb (64.4 kg)  02/01/17 140 lb (63.5 kg)    Physical Exam  Constitutional: She appears well-developed. No distress.  HENT:  Head: Normocephalic.  Right Ear: External ear normal.  Left Ear: External ear normal.  Nose: Nose normal.  Mouth/Throat: Oropharynx is clear and  moist.  Eyes: Conjunctivae are normal. Pupils are equal, round, and reactive to light. Right eye exhibits no discharge. Left eye exhibits no discharge.  Neck: Normal range of motion. Neck supple. No JVD present. No tracheal deviation present. No thyromegaly present.  Cardiovascular: Normal rate, regular rhythm and normal heart sounds.  Pulmonary/Chest: No stridor. No respiratory distress. She has no wheezes.  Abdominal: Soft. Bowel sounds are normal. She exhibits no distension and no mass. There is no tenderness. There is no rebound and no guarding.  Musculoskeletal: She  exhibits tenderness. She exhibits no edema.  Lymphadenopathy:    She has no cervical adenopathy.  Neurological: She displays normal reflexes. No cranial nerve deficit. She exhibits normal muscle tone. Coordination normal.  Skin: No rash noted. No erythema.  Psychiatric: She has a normal mood and affect. Her behavior is normal. Judgment and thought content normal.  alopecia Painful joints, LS  Lab Results  Component Value Date   WBC 3.6 (L) 02/18/2017   HGB 11.9 02/18/2017   HCT 35.4 02/18/2017   PLT 132 (L) 02/18/2017   GLUCOSE 90 02/18/2017   ALT 7 02/18/2017   AST 16 02/18/2017   NA 141 02/18/2017   K 5.2 02/18/2017   CL 105 02/18/2017   CREATININE 1.35 (H) 02/18/2017   BUN 26 (H) 02/18/2017   CO2 30 02/18/2017   TSH 2.88 01/09/2011   INR 1.48 06/29/2016   HGBA1C 5.5 06/24/2016    Dg Chest 2 View  Result Date: 07/29/2016 CLINICAL DATA:  Post aortic arch replacement in 06/2016, some shortness of breath EXAM: CHEST  2 VIEW COMPARISON:  07/01/2016 FINDINGS: Normal heart size post median sternotomy and AVR. Calcified tortuous aorta. Mediastinal contours and pulmonary vascularity otherwise normal. Emphysematous changes without infiltrate, pleural effusion, or pneumothorax. Improved aeration in LEFT lung since prior exam. Osseous demineralization with chronic compression deformity of a vertebra at the thoracolumbar junction. LEFT shoulder prosthesis and RIGHT glenohumeral degenerative changes. IMPRESSION: Postsurgical and emphysematous changes without acute infiltrate. Electronically Signed   By: Lavonia Dana M.D.   On: 07/29/2016 15:25    Assessment & Plan:   There are no diagnoses linked to this encounter. I have discontinued Nataliya Graig. [place]'s colchicine. I am also having her maintain her aspirin EC, Vitamin D3, Cyanocobalamin, meclizine, loratadine, rOPINIRole, triamcinolone ointment, ranitidine, Estradiol, oxyCODONE, SUMAtriptan, etanercept, and Teriparatide (Recombinant).  No  orders of the defined types were placed in this encounter.    Follow-up: No Follow-up on file.  Walker Kehr, MD

## 2017-04-05 NOTE — Assessment & Plan Note (Addendum)
Chronic  Dr Patrecia Pour On [facility] Rx  Potential benefits of a long term opioids use as well as potential risks (i.e. addiction risk, apnea etc) and complications (i.e. Somnolence, constipation and others) were explained to the patient and were aknowledged.

## 2017-04-05 NOTE — Assessment & Plan Note (Signed)
On B12 

## 2017-04-05 NOTE — Patient Instructions (Signed)
MC well w/Jill 

## 2017-04-05 NOTE — Assessment & Plan Note (Signed)
Dr Estanislado Pandy 2018 [place]

## 2017-04-05 NOTE — Assessment & Plan Note (Signed)
Options discussed Proscar 2.5 mg qod

## 2017-04-06 NOTE — Progress Notes (Signed)
Thank you for forwarding her labs. Were unable to contact this patient. We have sent a letter to her to reduce her Enbrel dose to every other week. She has neutropenia due to use of immunosuppressive agents. I'm uncertain about the etiology of her elevated creatinine.

## 2017-04-07 ENCOUNTER — Telehealth: Payer: Self-pay

## 2017-04-07 NOTE — Telephone Encounter (Signed)
Patients Forteo has been delivered from [facility] (04/06/17) and has been placed in the refrigerator. Please schedule nursing visit for first dose.   Dang Mathison, [place], CPhT 11:02 AM

## 2017-04-07 NOTE — Telephone Encounter (Signed)
Patient has been scheduled for nurse visit 04/08/17 at 10 am.

## 2017-04-07 NOTE — Telephone Encounter (Signed)
Reviewed lab work with patient and patient states she received our letter and has reduced her Enbrel to every other week per Dr. Arlean Hopping recommendations .

## 2017-04-08 ENCOUNTER — Ambulatory Visit: Payer: Self-pay

## 2017-04-14 ENCOUNTER — Ambulatory Visit: Payer: Medicare HMO

## 2017-04-15 ENCOUNTER — Other Ambulatory Visit: Payer: Self-pay | Admitting: Internal Medicine

## 2017-04-20 ENCOUNTER — Telehealth: Payer: Self-pay

## 2017-04-20 NOTE — Telephone Encounter (Signed)
Received a notification from [place] [employer] stating that the patient's authorization for Enbrel will expire on 05/03/2017. Submitted a renewal authorization for patient through cover my meds. Will update once we receive a response.   Jovan Colligan, [place], CPhT 12:01 PM

## 2017-04-21 NOTE — Telephone Encounter (Signed)
Received a fax from [facility] regarding a prior authorization approval for ENBREL from 05/02/2017 to 05/03/2018.   Reference number: [facility] Phone number:[phone]  Will send document to scan center.  Yovana Scogin, [place], CPhT 8:39 AM

## 2017-04-30 ENCOUNTER — Telehealth: Payer: Self-pay | Admitting: Internal Medicine

## 2017-04-30 NOTE — Telephone Encounter (Signed)
Sending to provider

## 2017-04-30 NOTE — Telephone Encounter (Signed)
Check [place] registry last filled 03/24/2017../lmb  

## 2017-04-30 NOTE — Telephone Encounter (Signed)
Copied from [place] [phone]. Topic: Quick Communication - See Telephone Encounter >> Apr 30, 2017  1:50 PM Hewitt Shorts wrote: CRM for notification. See Telephone encounter for:  pt is needing a refill on oxycodone   Best number [phone] 04/30/17.

## 2017-05-02 MED ORDER — OXYCODONE HCL ER 10 MG PO T12A: 10.0000 mg | EXTENDED_RELEASE_TABLET | Freq: Two times a day (BID) | ORAL | 0 refills | Status: DC

## 2017-05-02 NOTE — Telephone Encounter (Signed)
Rx emailed Thx 

## 2017-05-03 NOTE — Telephone Encounter (Signed)
Called pt no answer LMOM rx has been sent to [facility].../lmb  

## 2017-05-06 ENCOUNTER — Other Ambulatory Visit: Payer: Self-pay | Admitting: Internal Medicine

## 2017-05-06 ENCOUNTER — Other Ambulatory Visit: Payer: Self-pay

## 2017-05-06 DIAGNOSIS — S22000A Wedge compression fracture of unspecified thoracic vertebra, initial encounter for closed fracture: Secondary | ICD-10-CM

## 2017-05-06 DIAGNOSIS — M19012 Primary osteoarthritis, left shoulder: Secondary | ICD-10-CM

## 2017-05-06 DIAGNOSIS — M4647 Discitis, unspecified, lumbosacral region: Secondary | ICD-10-CM

## 2017-05-06 MED ORDER — OXYCODONE HCL ER 10 MG PO T12A: 10.0000 mg | EXTENDED_RELEASE_TABLET | Freq: Two times a day (BID) | ORAL | 0 refills | Status: DC

## 2017-05-06 NOTE — Telephone Encounter (Signed)
Rx was sent to wrong pharmacy, please sign off

## 2017-05-10 ENCOUNTER — Telehealth: Payer: Self-pay | Admitting: Internal Medicine

## 2017-05-10 MED ORDER — ENOXAPARIN SODIUM 40 MG/0.4ML ~~LOC~~ SOLN: 40.0000 mg | SUBCUTANEOUS | 1 refills | Status: DC

## 2017-05-10 NOTE — Telephone Encounter (Signed)
Copied from [employer] [id number]. Topic: Quick Communication - See Telephone Encounter >> May 10, 2017  3:00 PM Ether Griffins B wrote: CRM for notification. See Telephone encounter Pt was taken off of lovenox and pt is traveling to [place] on the (1/22-2/5) and was told on any long trips to get a shot of lovenox. She is wanting to know if 2 can be called in. [facility] [address]  05/10/17.

## 2017-05-10 NOTE — Telephone Encounter (Signed)
Ok done Thx 

## 2017-05-11 NOTE — Telephone Encounter (Signed)
Called pt no answer LMOM MD sent rx to [facility]../lmb 

## 2017-05-21 ENCOUNTER — Telehealth: Payer: Self-pay | Admitting: Internal Medicine

## 2017-05-21 ENCOUNTER — Telehealth: Payer: Self-pay | Admitting: Rheumatology

## 2017-05-21 NOTE — Telephone Encounter (Signed)
Returned pts call. She states that she stopped taking Foreto the week before christmas due to being sick. She has not had a dose since then and does not have any at home. She was currently using patient assistance through [employer]. She will come by the clinic on Monday, January 21 to sign application and drop off her financial documents for everyone in her household (her daughter and herself). Once all documents have been received, we will fax application to foundation. Patient voices understanding and denies any questions at this time.   Will submit a prior authorization for Forteo via cover my meds.   Zethan Alfieri, [place], CPhT 2:27 PM

## 2017-05-21 NOTE — Telephone Encounter (Signed)
Copied from [place] [id number]. Topic: Inquiry >> May 21, 2017 12:29 PM Cecelia Byars, NT wrote: Reason for CRM: Patient called she is going to [place]  and she will need a note that says she does not need oxygen, by Monday  please call [phone]

## 2017-05-21 NOTE — Telephone Encounter (Signed)
Patient calling in reference to [facility]. Patient dc'd it before Christmas due to feeling like her BP was low. Patient is out of meds, and wants to know if you are renewing it or not. Patient was very aggravated, and requested a call back from [place] to clarify next set.

## 2017-05-24 ENCOUNTER — Telehealth: Payer: Self-pay

## 2017-05-24 NOTE — Telephone Encounter (Signed)
Submitted a prior auth to pts insurance for Forteo to satisfy the pts application for [employer].   Received a fax from [facility] regarding a prior authorization approval for Diane Abbott from 05/03/2017 to 05/03/2018.   Reference number:[id number] Phone [phone]  Will send document to scan center.   Called pt to update. Left voice mail.   Diane Abbott, [place], CPhT 10:23 AM

## 2017-05-24 NOTE — Telephone Encounter (Signed)
Patient came by clinic to drop off her financial documents for the [facility] Patient Assistance Application. We will fax the application to the foundation once the provider portion has been completed. Pt is going out of town for 2 weeks. Will update once we receive a response.   Ron Junco, [place], CPhT 1:52 PM

## 2017-05-24 NOTE — Telephone Encounter (Signed)
Patient states she flys out tomorrow and she is needing the note by today. Please advise

## 2017-05-24 NOTE — Telephone Encounter (Signed)
Pt notified letter ready

## 2017-05-25 NOTE — Telephone Encounter (Signed)
Application was completed and faxed to foundation. Will update once we receive a response.   Diane Abbott 4:21 PM

## 2017-06-10 ENCOUNTER — Ambulatory Visit (INDEPENDENT_AMBULATORY_CARE_PROVIDER_SITE_OTHER): Payer: Medicare HMO | Admitting: Internal Medicine

## 2017-06-10 ENCOUNTER — Encounter: Payer: Self-pay | Admitting: Internal Medicine

## 2017-06-10 DIAGNOSIS — F331 Major depressive disorder, recurrent, moderate: Secondary | ICD-10-CM

## 2017-06-10 DIAGNOSIS — S22000A Wedge compression fracture of unspecified thoracic vertebra, initial encounter for closed fracture: Secondary | ICD-10-CM

## 2017-06-10 DIAGNOSIS — N183 Chronic kidney disease, stage 3 unspecified: Secondary | ICD-10-CM

## 2017-06-10 DIAGNOSIS — E559 Vitamin D deficiency, unspecified: Secondary | ICD-10-CM | POA: Diagnosis not present

## 2017-06-10 DIAGNOSIS — G894 Chronic pain syndrome: Secondary | ICD-10-CM

## 2017-06-10 DIAGNOSIS — M4647 Discitis, unspecified, lumbosacral region: Secondary | ICD-10-CM

## 2017-06-10 DIAGNOSIS — E538 Deficiency of other specified B group vitamins: Secondary | ICD-10-CM

## 2017-06-10 DIAGNOSIS — F411 Generalized anxiety disorder: Secondary | ICD-10-CM | POA: Diagnosis not present

## 2017-06-10 DIAGNOSIS — M19012 Primary osteoarthritis, left shoulder: Secondary | ICD-10-CM | POA: Diagnosis not present

## 2017-06-10 DIAGNOSIS — R69 Illness, unspecified: Secondary | ICD-10-CM | POA: Diagnosis not present

## 2017-06-10 MED ORDER — OXYCODONE HCL ER 10 MG PO T12A: 10.0000 mg | EXTENDED_RELEASE_TABLET | Freq: Two times a day (BID) | ORAL | 0 refills | Status: DC

## 2017-06-10 NOTE — Assessment & Plan Note (Signed)
Chronic FMS/CTD, severe On Oxycontin  Potential benefits of a long term opioids use as well as potential risks (i.e. addiction risk, apnea etc) and complications (i.e. Somnolence, constipation and others) were explained to the patient and were aknowledged

## 2017-06-10 NOTE — Assessment & Plan Note (Signed)
On Vit D 

## 2017-06-10 NOTE — Assessment & Plan Note (Signed)
Off meds now

## 2017-06-10 NOTE — Assessment & Plan Note (Signed)
Doing fair 

## 2017-06-10 NOTE — Progress Notes (Signed)
Subjective:  Patient ID: Diane Abbott, female    DOB: 1934-10-03  Age: 83 y.o. MRN: [id number]  CC: No chief complaint on file.   HPI Diane Abbott presents for LBP, CTD, B12 def, anticoagulation f/u  Outpatient Medications Prior to Visit  Medication Sig Dispense Refill  . aspirin EC 81 MG tablet Take 81 mg by mouth at bedtime.     . Cholecalciferol (VITAMIN D3) 2000 units capsule Take 1 capsule (2,000 Units total) by mouth daily. 100 capsule 3  . Cyanocobalamin 1000 MCG SUBL Place 1 tablet (1,000 mcg total) under the tongue daily. 100 tablet 11  . Estradiol (VAGIFEM) 10 MCG TABS vaginal tablet INSERT ONE TABLET VAGINALLY EVERY 2 (TWO) DAYS 15 tablet 5  . etanercept (ENBREL SURECLICK) 50 MG/ML injection Inject 0.98 mLs (50 mg total) into the skin once a week. 11.76 mL 0  . finasteride (PROSCAR) 5 MG tablet As directed 30 tablet 11  . loratadine (CLARITIN) 10 MG tablet Take 10 mg by mouth daily.    . meclizine (ANTIVERT) 12.5 MG tablet Take 12.5 mg by mouth 3 (three) times daily as needed for dizziness.    Marland Kitchen oxyCODONE (OXYCONTIN) 10 mg 12 hr tablet Take 1 tablet (10 mg total) by mouth 2 (two) times daily. 60 tablet 0  . ranitidine (ZANTAC) 300 MG tablet Take 1 tablet (300 mg total) by mouth at bedtime. (Patient taking differently: Take 300 mg by mouth as needed. ) 30 tablet 11  . rOPINIRole (REQUIP) 4 MG tablet TAKE 1 TABLET BY MOUTH 3 TIMES A DAY 90 tablet 5  . SUMAtriptan (IMITREX) 50 MG tablet 1 po qd prn migraine 10 tablet 3  . Teriparatide, Recombinant, 600 MCG/2.4ML SOLN Inject 0.08 mLs (20 mcg total) daily into the skin. 2.24 mL 2  . triamcinolone ointment (KENALOG) 0.5 % Apply 1 application topically 3 (three) times daily. 90 g 2  . enoxaparin (LOVENOX) 40 MG/0.4ML injection Inject 0.4 mLs (40 mg total) into the skin daily for 2 doses. Use on the travel days 2 Syringe 1   No facility-administered medications prior to visit.     ROS Review of Systems  Constitutional:  Negative for activity change, appetite change, chills, fatigue and unexpected weight change.  HENT: Negative for congestion, mouth sores and sinus pressure.   Eyes: Negative for visual disturbance.  Respiratory: Negative for cough and chest tightness.   Gastrointestinal: Negative for abdominal pain and nausea.  Genitourinary: Negative for difficulty urinating, frequency and vaginal pain.  Musculoskeletal: Positive for arthralgias, back pain and gait problem.  Skin: Negative for pallor and rash.  Neurological: Negative for dizziness, tremors, weakness, numbness and headaches.  Psychiatric/Behavioral: Negative for confusion and sleep disturbance. The patient is nervous/anxious.     Objective:  BP 138/78   Pulse 83   Temp 98.2 F (36.8 C)   Wt 153 lb (69.4 kg)   SpO2 99%   BMI 28.91 kg/m   BP Readings from Last 3 Encounters:  06/10/17 138/78  04/05/17 134/82  02/18/17 (!) 117/58    Wt Readings from Last 3 Encounters:  06/10/17 153 lb (69.4 kg)  04/05/17 146 lb (66.2 kg)  02/18/17 142 lb (64.4 kg)    Physical Exam  Constitutional: She appears well-developed. No distress.  HENT:  Head: Normocephalic.  Right Ear: External ear normal.  Left Ear: External ear normal.  Nose: Nose normal.  Mouth/Throat: Oropharynx is clear and moist.  Eyes: Conjunctivae are normal. Pupils are equal, round, and reactive  to light. Right eye exhibits no discharge. Left eye exhibits no discharge.  Neck: Normal range of motion. Neck supple. No JVD present. No tracheal deviation present. No thyromegaly present.  Cardiovascular: Normal rate, regular rhythm and normal heart sounds.  Pulmonary/Chest: No stridor. No respiratory distress. She has no wheezes.  Abdominal: Soft. Bowel sounds are normal. She exhibits no distension and no mass. There is no tenderness. There is no rebound and no guarding.  Musculoskeletal: She exhibits tenderness. She exhibits no edema.  Lymphadenopathy:    She has no cervical  adenopathy.  Neurological: She displays normal reflexes. No cranial nerve deficit. She exhibits normal muscle tone. Coordination abnormal.  Skin: No rash noted. No erythema.  Psychiatric: She has a normal mood and affect. Her behavior is normal. Judgment and thought content normal.   LS tender  Lab Results  Component Value Date   WBC 3.5 (L) 04/05/2017   HGB 12.7 04/05/2017   HCT 38.4 04/05/2017   PLT 147.0 (L) 04/05/2017   GLUCOSE 108 (H) 04/05/2017   ALT 9 04/05/2017   AST 19 04/05/2017   NA 138 04/05/2017   K 5.1 04/05/2017   CL 104 04/05/2017   CREATININE 1.35 (H) 04/05/2017   BUN 34 (H) 04/05/2017   CO2 25 04/05/2017   TSH 2.88 01/09/2011   INR 1.48 06/29/2016   HGBA1C 5.5 06/24/2016    Dg Chest 2 View  Result Date: 07/29/2016 CLINICAL DATA:  Post aortic arch replacement in 06/2016, some shortness of breath EXAM: CHEST  2 VIEW COMPARISON:  07/01/2016 FINDINGS: Normal heart size post median sternotomy and AVR. Calcified tortuous aorta. Mediastinal contours and pulmonary vascularity otherwise normal. Emphysematous changes without infiltrate, pleural effusion, or pneumothorax. Improved aeration in LEFT lung since prior exam. Osseous demineralization with chronic compression deformity of a vertebra at the thoracolumbar junction. LEFT shoulder prosthesis and RIGHT glenohumeral degenerative changes. IMPRESSION: Postsurgical and emphysematous changes without acute infiltrate. Electronically Signed   By: Lavonia Dana M.D.   On: 07/29/2016 15:25    Assessment & Plan:   There are no diagnoses linked to this encounter. I am having Diane Abbott. [place] maintain her aspirin EC, Vitamin D3, Cyanocobalamin, meclizine, loratadine, triamcinolone ointment, ranitidine, Estradiol, SUMAtriptan, etanercept, Teriparatide (Recombinant), finasteride, rOPINIRole, oxyCODONE, and enoxaparin.  No orders of the defined types were placed in this encounter.    Follow-up: No Follow-up on file.  Walker Kehr, MD

## 2017-06-10 NOTE — Assessment & Plan Note (Signed)
Dr Arty Baumgartner

## 2017-06-10 NOTE — Assessment & Plan Note (Signed)
On B12 

## 2017-06-15 MED ORDER — TERIPARATIDE (RECOMBINANT) 600 MCG/2.4ML ~~LOC~~ SOLN: 20.0000 ug | Freq: Every day | SUBCUTANEOUS | 2 refills | Status: DC

## 2017-06-15 NOTE — Telephone Encounter (Signed)
Called foundation to check the status of pts application. Spoke with Hassan Rowan who states that pt will need a copy of the Rx for [facility] in to complete the application.   Please fax a printed copy of RX to [facility] [phone] to complete pts application. Thanks!  Dayannara Pascal, [place], CPhT 4:41 PM

## 2017-06-15 NOTE — Progress Notes (Signed)
Office Visit Note  Patient: Diane Abbott             Date of Birth: 01-16-1935           MRN: [id number]             PCP: Cassandria Anger, MD Referring: Cassandria Anger, MD Visit Date: 06/29/2017 Occupation: @GUAROCC @    Subjective:  Medication management.   History of Present Illness: Diane Abbott is a 83 y.o. female with history of rheumatoid arthritis, osteoarthritis and fibromyalgia syndrome.  She states she is doing quite well on Enbrel every 2 weeks.  She continues to have some stiffness in her joints.  She denies any joint swelling.  Her replaced joints are doing well.  Activities of Daily Living:  Patient reports morning stiffness for 5 minutes.   Patient Reports nocturnal pain.  Difficulty dressing/grooming: Denies Difficulty climbing stairs: Reports Difficulty getting out of chair: Denies Difficulty using hands for taps, buttons, cutlery, and/or writing: Reports   Review of Systems  Constitutional: Positive for fatigue. Negative for night sweats, weight gain, weight loss and weakness.  HENT: Negative for mouth sores, trouble swallowing, trouble swallowing, mouth dryness and nose dryness.   Eyes: Positive for dryness. Negative for pain, redness and visual disturbance.  Respiratory: Negative for cough, shortness of breath and difficulty breathing.   Cardiovascular: Negative for chest pain, palpitations, hypertension, irregular heartbeat and swelling in legs/feet.  Gastrointestinal: Negative for blood in stool, constipation and diarrhea.  Endocrine: Negative for increased urination.  Genitourinary: Negative for vaginal dryness.  Musculoskeletal: Positive for arthralgias, joint pain and morning stiffness. Negative for joint swelling, myalgias, muscle weakness, muscle tenderness and myalgias.  Skin: Negative for color change, rash, hair loss, skin tightness, ulcers and sensitivity to sunlight.  Allergic/Immunologic: Negative for susceptible to infections.    Neurological: Negative for dizziness, memory loss and night sweats.  Hematological: Negative for swollen glands.  Psychiatric/Behavioral: Positive for sleep disturbance. Negative for depressed mood. The patient is not nervous/anxious.     PMFS History:  Patient Active Problem List   Diagnosis Date Noted  . Alopecia 04/05/2017  . Migraines 02/01/2017  . History of vertebral fracture 11/14/2016  . 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 History of left shoulder replacement 11/13/2016  . History of total knee replacement, bilateral 11/13/2016  . History of discitis and osteomyelitis vertebrae 09/17/2016  . History of DVT (deep vein thrombosis) and PE 09/17/2016  . History of chronic kidney disease 09/17/2016  . History of myeloproliferative disorder 09/17/2016  . History of IBS 09/17/2016  . Foot pain, left 08/26/2016  . H/O aortic arch replacement 07/03/2016  . Thoracic ascending aortic aneurysm ([place]) 06/29/2016  . Upper respiratory infection 06/05/2016  . Anxiety state 01/02/2016  . Callus of foot 12/02/2015  . Leukopenia 11/11/2015  . Diskitis 08/29/2015  . Acute osteomyelitis of lumbar spine ([place]) 07/29/2015  . Thoracic compression fracture ([place]) 07/29/2015  . Discitis of lumbosacral region   . Epidural abscess   . Chronic renal insufficiency, stage III (moderate) ([place]) 02/28/2015  . Hammer toe, acquired 11/29/2014  . Actinic keratoses 01/25/2014  . Thrombocytopenia due to drugs 11/24/2013  . Knee pain, right anterior 07/25/2013  . Atrophic vaginitis 03/23/2013  . S/P ascending aortic replacement 10/04/2012  . S/P AVR (aortic valve replacement) 10/04/2012  . Aortic aneurysm, thoracic ([place]) 09/14/2012  . Aortic insufficiency 09/14/2012  . Hyperglycemia 07/27/2012  . Wound, open, forearm 03/21/2012  . Osteoarthritis of left shoulder 12/07/2011  .  Breast lump in female 04/07/2011  . COPD (chronic obstructive pulmonary disease) ([place]) 03/12/2011  . Esophagitis,  unspecified 01/09/2011  . Insomnia 11/04/2010  . RESTLESS LEG SYNDROME 02/03/2010  . THRUSH 07/04/2009  . PRURITUS 05/16/2009  . PULMONARY EMBOLISM 09/14/2008  . Constipation 09/14/2008  . Edema 01/09/2008  . Pneumonia, organism unspecified(486) 07/13/2007  . Chest pain 07/13/2007  . Acute thromboembolism of deep veins of lower extremity ([place]) 07/08/2007  . GERD 07/08/2007  . Irritable bowel syndrome 07/08/2007  . Chronic pain 07/08/2007  . CYSTITIS 05/31/2007  . Depression 05/11/2007  . B12 deficiency 03/10/2007  . Vitamin D deficiency 03/10/2007  . OSTEOARTHRITIS 03/10/2007  . Osteopenia 03/10/2007  . VERTIGO 03/10/2007  . PULMONARY EMBOLISM, HX OF 03/10/2007  . Rheumatoid arthritis ([place]) 11/23/2006    Past Medical History:  Diagnosis Date  . Adrenal insufficiency ([place])   . Anemia   . Anginal pain [facility])    "comes and goes has occured since 2012"  . Blood dyscrasia    "problems with platelets, red cells and white cells being low"  . Bronchitis Jan 2013-March 2013   severe  . Cancer ([place])    skin cancer on face  . Cataracts, bilateral    more in left than right  . Depression   . Esophagitis   . Fatty liver   . Fibromyalgia   . Fibromyalgia   . Foot pain, left 08/26/2016   2/18 1st MTP - gout vs other  . GERD (gastroesophageal reflux disease)   . Heart murmur   . History of blood clots 2008   below knee post trauma. Dilated [facility] no clotting disorder felt to be present.  . History of echocardiogram    Echo 11/16: Mild LVH, EF 60-65%, normal wall motion, grade 1 diastolic dysfunction, bioprosthetic AVR with mean gradient 21 mmHg and no regurgitation, proximal aortic arch aneurysm 49 mm, moderate TR, PASP 31 mmHg  . IBS (irritable bowel syndrome)   . Leaky heart valve   . Osteoarthritis   . Osteoarthritis of left shoulder 12/07/2011  . Osteopenia   . Pulmonary embolism ([place])   . RA (rheumatoid arthritis) ([place])   . Shortness of breath    attributed to  aneurysm  . Sliding hiatal hernia   . Urinary leakage    when coughing  . Vertigo    hx of  . Vitamin B 12 deficiency   . Vitamin D deficiency     Family History  Problem Relation Age of Onset  . Ovarian cancer Mother   . Diabetes Mother   . COPD Father   . Heart disease Maternal Grandmother   . Clotting disorder Unknown        Father, brother, sister, and daughter all had deep venous thrombosis  . Breast cancer Unknown   . Pancreatic cancer Paternal Uncle   . Colon cancer Neg Hx    Past Surgical History:  Procedure Laterality Date  . ABDOMINAL HYSTERECTOMY    . AORTA SURGERY  12/2012   Resection of aneurysm and valve replacement  . APPENDECTOMY  1956  . BENTALL PROCEDURE N/A 09/29/2012   Procedure: BENTALL PROCEDURE;  Surgeon: Gaye Pollack, MD;  Location: [facility];  Service: Open Heart Surgery;  Laterality: N/A;  WITH CIRC ARREST  . BILATERAL OOPHORECTOMY    . BREAST BIOPSY Left   . CARDIAC CATHETERIZATION  09/15/12   no PCI  . CATARACT EXTRACTION Bilateral   . CHOLECYSTECTOMY  2003  . COLONOSCOPY    .  COSMETIC SURGERY     Tummy tuck  . INTRAOPERATIVE TRANSESOPHAGEAL ECHOCARDIOGRAM N/A 09/29/2012   Procedure: INTRAOPERATIVE TRANSESOPHAGEAL ECHOCARDIOGRAM;  Surgeon: Gaye Pollack, MD;  Location: [facility] OR;  Service: Open Heart Surgery;  Laterality: N/A;  . JOINT REPLACEMENT Bilateral   . REPLACEMENT ASCENDING AORTA N/A 06/29/2016   Procedure: AORTIC ARCH REPLACEMENT WITH CIRC ARREST (N/A) - RIGHT AXILLARY CANNULATION  AORTO-AORTIC 28 HEMASHIELD GRAFT WITH AORTO-INNOMINATE AND AORTO-LEFT CAROTID ANASTAMOSIS;  Surgeon: Gaye Pollack, MD;  Location: [facility] OR;  Service: Open Heart Surgery;  Laterality: N/A;  RIGHT AXILLARY CANNULATION  BILATERAL RADIAL A-LINE  . SKIN CANCER EXCISION Right    cheek  . TEE WITHOUT CARDIOVERSION N/A 06/29/2016   Procedure: TRANSESOPHAGEAL ECHOCARDIOGRAM (TEE);  Surgeon: Gaye Pollack, MD;  Location: [facility];  Service: Open Heart Surgery;  Laterality: N/A;   . TONSILLECTOMY  1941  . TOTAL KNEE ARTHROPLASTY     bilateral  . TOTAL SHOULDER ARTHROPLASTY  12/07/2011   Procedure: TOTAL SHOULDER ARTHROPLASTY;  Surgeon: Johnny Bridge, MD;  Location: [facility];  Service: Orthopedics;  Laterality: Left;  left total shoulder artthroplasty  . WRIST SURGERY     bilateral   Social History   Social History Narrative  . Not on file     Objective: Vital Signs: BP 137/77 (BP Location: Left Arm, Patient Position: Sitting, Cuff Size: Normal)   Pulse 75   Resp 13   Ht 5\' 1"  (1.549 m)   Wt 159 lb (72.1 kg)   BMI 30.04 kg/m    Physical Exam  Constitutional: She is oriented to person, place, and time. She appears well-developed and well-nourished.  HENT:  Head: Normocephalic and atraumatic.  Eyes: Conjunctivae and EOM are normal.  Neck: Normal range of motion.  Cardiovascular: Normal rate, regular rhythm and intact distal pulses.  Murmur heard. Pulmonary/Chest: Effort normal and breath sounds normal.  Abdominal: Soft. Bowel sounds are normal.  Lymphadenopathy:    She has no cervical adenopathy.  Neurological: She is alert and oriented to person, place, and time.  Skin: Skin is warm and dry. Capillary refill takes less than 2 seconds.  Psychiatric: She has a normal mood and affect. Her behavior is normal.  Nursing note and vitals reviewed.    Musculoskeletal Exam: Spine thoracic lumbar spine limited range of motion.  Left shoulder is replaced, elbow joints wrist joint, MCP thickening was noted.  PIP and DIP thickening was noted.  Hip joints , ankles MTPs PIPs with good range of motion with no synovitis.  Bilateral knee joints replaced. CDAI Exam: CDAI Homunculus Exam:   Joint Counts:  CDAI Tender Joint count: 0 CDAI Swollen Joint count: 0  Global Assessments:  Patient Global Assessment: 5 Provider Global Assessment: 3  CDAI Calculated Score: 8    Investigation: No additional findings.TB Gold: 09/25/2016 Negative  CBC Latest Ref Rng &  Units 04/05/2017 02/18/2017 10/07/2016  WBC 4.0 - 10.5 K/uL 3.5(L) 3.6(L) 5.3  Hemoglobin 12.0 - 15.0 g/dL 12.7 11.9 10.6(L)  Hematocrit 36.0 - 46.0 % 38.4 35.4 32.6(L)  Platelets 150.0 - 400.0 K/uL 147.0(L) 132(L) 179   CMP Latest Ref Rng & Units 04/05/2017 02/18/2017 10/07/2016  Glucose 70 - 99 mg/dL 108(H) 90 109(H)  BUN 6 - 23 mg/dL 34(H) 26(H) 23  Creatinine 0.40 - 1.20 mg/dL 1.35(H) 1.35(H) 1.16(H)  Sodium 135 - 145 mEq/L 138 141 137  Potassium 3.5 - 5.1 mEq/L 5.1 5.2 4.0  Chloride 96 - 112 mEq/L 104 105 104  CO2 19 -  32 mEq/L 25 30 18(L)  Calcium 8.4 - 10.5 mg/dL 10.0 9.1 8.6  Total Protein 6.0 - 8.3 g/dL 8.1 7.1 7.1  Total Bilirubin 0.2 - 1.2 mg/dL 0.5 0.4 0.6  Alkaline Phos 39 - 117 U/L 85 - 88  AST 0 - 37 U/L 19 16 15   ALT 0 - 35 U/L 9 7 7     Imaging: No results found.  Speciality Comments: No specialty comments available.    Procedures:  No procedures performed Allergies: Acetaminophen; Codeine; Pramipexole dihydrochloride; Rofecoxib; Venlafaxine; Arava [leflunomide]; Clonazepam; Diclofenac sodium; Alprazolam; Darvocet [propoxyphene n-acetaminophen]; Duloxetine; Gabapentin; Latex; Morphine and related; Nortriptyline hcl; Penicillins; and Prednisone   Assessment / Plan:     Visit Diagnoses: Rheumatoid arthritis of multiple sites with negative rheumatoid factor (HCC) - severe erosive disease.  Disease appears to be quite well controlled with no synovitis on examination today.  She does have synovial thickening.  Rheumatoid nodulosis (HCC)  High risk medication use - Enbrel 50 mg subcutaneous every 2 week. Simponi Donalda Ewings was held in January 2017 due to discitis, (inadequate response-Cimzia, Humira, intolerance-MTX& Remicade).  - Plan: CBC with Differential/Platelet, COMPLETE METABOLIC PANEL WITH GFR, today and QuantiFERON-TB Gold Plus, with her next labs in June.  Fibromyalgia: She continues to have some generalized pain and discomfort.  History of left shoulder replacement:  Chronic pain  History of total knee replacement, bilateral she continues to have some discomfort.  History of chronic kidney disease  History of vitamin D deficiency - She is on supplement  History of discitis - osteomyelitis vertebrae - while on Simponi Aria. Patient had clearence from ID to restart BDMARDS.  History of vertebral fracture - Closed compression fracture of thoracic vertebrae  Other osteoporosis without current pathological fracture - Her last DEXA in August 2017 showed T score of -4.0. She recalls being on bisphosphonates in the past and does not want to take them again.  Moderate episode of recurrent major depressive disorder (HCC)  History of DVT (deep vein thrombosis) - and PE  History of myeloproliferative disorder  History of IBS  H/O aortic arch replacement  History of gastroesophageal reflux (GERD)  S/P AVR (aortic valve replacement)  History of COPD    Orders: Orders Placed This Encounter  Procedures  . CBC with Differential/Platelet  . COMPLETE METABOLIC PANEL WITH GFR  . QuantiFERON-TB Gold Plus   No orders of the defined types were placed in this encounter.   Face-to-face time spent with patient was 30 minutes. Greater than 50% of time was spent in counseling and coordination of care.  Follow-Up Instructions: Return in about 5 months (around 11/26/2017).   Bo Merino, MD  Note - This record has been created using Editor, commissioning.  Chart creation errors have been sought, but may not always  have been located. Such creation errors do not reflect on  the standard of medical care.

## 2017-06-15 NOTE — Addendum Note (Signed)
Addended by: Carole Binning on: 06/15/2017 04:58 PM   Modules accepted: Orders

## 2017-06-15 NOTE — Telephone Encounter (Signed)
Prescription faxed to pharmacy.

## 2017-06-21 ENCOUNTER — Telehealth: Payer: Self-pay

## 2017-06-21 NOTE — Telephone Encounter (Signed)
[place] to check the status of pts application. Spoke with Erline Levine who states that all documents have been received. We should have a response today. Last shipment was sent on 03/19/2017. Next shipment available can be sent on 06/17/17.   Will update once we receive a response.   Faiz Weber, [place], CPhT 12:05 PM

## 2017-06-23 ENCOUNTER — Other Ambulatory Visit: Payer: Self-pay | Admitting: Rheumatology

## 2017-06-23 NOTE — Telephone Encounter (Signed)
Received a fax from [facility] stating that pt meets program eligibility requirements and is enrolled in [facility] cares until the end of the calender year.   Will send document to scan center.  Called pt to update. Left voice mail.   Niang Mitcheltree, [place], CPhT 10:07 AM

## 2017-06-29 ENCOUNTER — Ambulatory Visit: Payer: Medicare HMO | Admitting: Rheumatology

## 2017-06-29 ENCOUNTER — Encounter: Payer: Self-pay | Admitting: Rheumatology

## 2017-06-29 VITALS — BP 137/77 | HR 75 | Resp 13 | Ht 61.0 in | Wt 159.0 lb

## 2017-06-29 DIAGNOSIS — M818 Other osteoporosis without current pathological fracture: Secondary | ICD-10-CM

## 2017-06-29 DIAGNOSIS — F331 Major depressive disorder, recurrent, moderate: Secondary | ICD-10-CM

## 2017-06-29 DIAGNOSIS — Z8639 Personal history of other endocrine, nutritional and metabolic disease: Secondary | ICD-10-CM | POA: Diagnosis not present

## 2017-06-29 DIAGNOSIS — M0630 Rheumatoid nodule, unspecified site: Secondary | ICD-10-CM

## 2017-06-29 DIAGNOSIS — Z79899 Other long term (current) drug therapy: Secondary | ICD-10-CM

## 2017-06-29 DIAGNOSIS — M797 Fibromyalgia: Secondary | ICD-10-CM

## 2017-06-29 DIAGNOSIS — Z8709 Personal history of other diseases of the respiratory system: Secondary | ICD-10-CM

## 2017-06-29 DIAGNOSIS — Z96612 Presence of left artificial shoulder joint: Secondary | ICD-10-CM

## 2017-06-29 DIAGNOSIS — Z8719 Personal history of other diseases of the digestive system: Secondary | ICD-10-CM

## 2017-06-29 DIAGNOSIS — M0609 Rheumatoid arthritis without rheumatoid factor, multiple sites: Secondary | ICD-10-CM

## 2017-06-29 DIAGNOSIS — Z8781 Personal history of (healed) traumatic fracture: Secondary | ICD-10-CM

## 2017-06-29 DIAGNOSIS — Z96653 Presence of artificial knee joint, bilateral: Secondary | ICD-10-CM

## 2017-06-29 DIAGNOSIS — Z87448 Personal history of other diseases of urinary system: Secondary | ICD-10-CM

## 2017-06-29 DIAGNOSIS — Z862 Personal history of diseases of the blood and blood-forming organs and certain disorders involving the immune mechanism: Secondary | ICD-10-CM

## 2017-06-29 DIAGNOSIS — Z95828 Presence of other vascular implants and grafts: Secondary | ICD-10-CM

## 2017-06-29 DIAGNOSIS — Z8739 Personal history of other diseases of the musculoskeletal system and connective tissue: Secondary | ICD-10-CM

## 2017-06-29 DIAGNOSIS — Z952 Presence of prosthetic heart valve: Secondary | ICD-10-CM

## 2017-06-29 DIAGNOSIS — Z86718 Personal history of other venous thrombosis and embolism: Secondary | ICD-10-CM

## 2017-06-29 LAB — CBC WITH DIFFERENTIAL/PLATELET
BASOS PCT: 1.2 %
Basophils Absolute: 42 cells/uL (ref 0–200)
EOS PCT: 9 %
Eosinophils Absolute: 315 cells/uL (ref 15–500)
HEMATOCRIT: 36.2 % (ref 35.0–45.0)
Hemoglobin: 12.6 g/dL (ref 11.7–15.5)
LYMPHS ABS: 1390 {cells}/uL (ref 850–3900)
MCH: 31.9 pg (ref 27.0–33.0)
MCHC: 34.8 g/dL (ref 32.0–36.0)
MCV: 91.6 fL (ref 80.0–100.0)
MPV: 10 fL (ref 7.5–12.5)
Monocytes Relative: 11.9 %
Neutro Abs: 1337 cells/uL — ABNORMAL LOW (ref 1500–7800)
Neutrophils Relative %: 38.2 %
PLATELETS: 121 10*3/uL — AB (ref 140–400)
RBC: 3.95 10*6/uL (ref 3.80–5.10)
RDW: 12.7 % (ref 11.0–15.0)
Total Lymphocyte: 39.7 %
WBC: 3.5 10*3/uL — AB (ref 3.8–10.8)
WBCMIX: 417 {cells}/uL (ref 200–950)

## 2017-06-29 LAB — COMPLETE METABOLIC PANEL WITH GFR
AG RATIO: 1.1 (calc) (ref 1.0–2.5)
ALT: 11 U/L (ref 6–29)
AST: 21 U/L (ref 10–35)
Albumin: 3.9 g/dL (ref 3.6–5.1)
Alkaline phosphatase (APISO): 78 U/L (ref 33–130)
BUN/Creatinine Ratio: 24 (calc) — ABNORMAL HIGH (ref 6–22)
BUN: 29 mg/dL — ABNORMAL HIGH (ref 7–25)
CALCIUM: 9.3 mg/dL (ref 8.6–10.4)
CO2: 28 mmol/L (ref 20–32)
CREATININE: 1.22 mg/dL — AB (ref 0.60–0.88)
Chloride: 106 mmol/L (ref 98–110)
GFR, EST AFRICAN AMERICAN: 48 mL/min/{1.73_m2} — AB (ref >=60)
GFR, EST NON AFRICAN AMERICAN: 42 mL/min/{1.73_m2} — AB (ref >=60)
GLOBULIN: 3.5 g/dL (ref 1.9–3.7)
Glucose, Bld: 86 mg/dL (ref 65–99)
POTASSIUM: 4.7 mmol/L (ref 3.5–5.3)
Sodium: 140 mmol/L (ref 135–146)
TOTAL PROTEIN: 7.4 g/dL (ref 6.1–8.1)
Total Bilirubin: 0.5 mg/dL (ref 0.2–1.2)

## 2017-06-29 NOTE — Patient Instructions (Signed)
Standing Labs We placed an order today for your standing lab work.    Please come back and get your standing labs in June and every 3 months. TB gold with next lab.  We have open lab Monday through Friday from 8:30-11:30 AM and 1:30-4 PM at the office of Dr. Bo Merino.   The office is located at [address], [place], [place], [place] [address] No appointment is necessary.   Labs are drawn by [facility].  You may receive a bill from [facility] for your lab work. If you have any questions regarding directions or hours of operation,  please call [phone].

## 2017-06-30 NOTE — Progress Notes (Signed)
Labs are stable.

## 2017-06-30 NOTE — Progress Notes (Signed)
Should continue Enbrel every other week

## 2017-07-09 ENCOUNTER — Telehealth: Payer: Self-pay

## 2017-07-09 MED ORDER — TERIPARATIDE (RECOMBINANT) 600 MCG/2.4ML ~~LOC~~ SOLN: 20.0000 ug | Freq: Every day | SUBCUTANEOUS | 2 refills | Status: DC

## 2017-07-09 NOTE — Telephone Encounter (Signed)
Patient advised. She will come by next week to get a sample of Forteo.  Harlea Goetzinger, [place], CPhT 1:43 PM

## 2017-07-09 NOTE — Telephone Encounter (Signed)
Yes she may come get a sample.

## 2017-07-09 NOTE — Telephone Encounter (Signed)
Patient called stating that she needs a refill on Enbrel with correct directions sent to [facility]. She uses one pen every 14 days. She has one pen left to inject this week.  She also need a new Rx for Forteo sent to [facility]. She has been out of medication since January. She would like a sample to hold her over.   Please send a new Rx for Enbrel and Forteo for pt. Can we provide a sample of Forteo for her? Thanks!  Nicholle Falzon, [place], CPhT 10:57 AM

## 2017-07-15 ENCOUNTER — Other Ambulatory Visit: Payer: Self-pay | Admitting: *Deleted

## 2017-07-15 NOTE — Progress Notes (Signed)
Medication Samples have been provided to the patient.  Drug name: FORTEO       Strength: 20 MCG      Qty: 1   LOT: [id number]  Exp.Date: 05/2018  Dosing instructions: Inject 20 mcg into the skin daily.  The patient has been instructed regarding the correct time, dose, and frequency of taking this medication, including desired effects and most common side effects.   Diane Abbott Tyarra Nolton 12:16 PM 07/15/2017

## 2017-07-15 NOTE — Progress Notes (Unsigned)
forteo 

## 2017-08-18 ENCOUNTER — Other Ambulatory Visit: Payer: Self-pay | Admitting: *Deleted

## 2017-08-18 DIAGNOSIS — I712 Thoracic aortic aneurysm, without rupture, unspecified: Secondary | ICD-10-CM

## 2017-08-19 ENCOUNTER — Ambulatory Visit: Payer: Medicare HMO | Admitting: Internal Medicine

## 2017-08-31 ENCOUNTER — Ambulatory Visit (HOSPITAL_COMMUNITY): Payer: Medicare HMO | Attending: Surgery

## 2017-08-31 ENCOUNTER — Other Ambulatory Visit: Payer: Self-pay

## 2017-08-31 DIAGNOSIS — I340 Nonrheumatic mitral (valve) insufficiency: Secondary | ICD-10-CM | POA: Insufficient documentation

## 2017-08-31 DIAGNOSIS — I712 Thoracic aortic aneurysm, without rupture, unspecified: Secondary | ICD-10-CM

## 2017-08-31 DIAGNOSIS — E119 Type 2 diabetes mellitus without complications: Secondary | ICD-10-CM | POA: Diagnosis not present

## 2017-09-08 ENCOUNTER — Ambulatory Visit: Payer: Medicare HMO | Admitting: Internal Medicine

## 2017-09-09 ENCOUNTER — Ambulatory Visit (INDEPENDENT_AMBULATORY_CARE_PROVIDER_SITE_OTHER): Payer: Medicare HMO | Admitting: Internal Medicine

## 2017-09-09 ENCOUNTER — Encounter: Payer: Self-pay | Admitting: Internal Medicine

## 2017-09-09 DIAGNOSIS — S22000A Wedge compression fracture of unspecified thoracic vertebra, initial encounter for closed fracture: Secondary | ICD-10-CM | POA: Diagnosis not present

## 2017-09-09 DIAGNOSIS — M25562 Pain in left knee: Secondary | ICD-10-CM

## 2017-09-09 DIAGNOSIS — E538 Deficiency of other specified B group vitamins: Secondary | ICD-10-CM

## 2017-09-09 DIAGNOSIS — M19012 Primary osteoarthritis, left shoulder: Secondary | ICD-10-CM

## 2017-09-09 DIAGNOSIS — G894 Chronic pain syndrome: Secondary | ICD-10-CM

## 2017-09-09 DIAGNOSIS — M4647 Discitis, unspecified, lumbosacral region: Secondary | ICD-10-CM | POA: Diagnosis not present

## 2017-09-09 DIAGNOSIS — R69 Illness, unspecified: Secondary | ICD-10-CM | POA: Diagnosis not present

## 2017-09-09 DIAGNOSIS — F331 Major depressive disorder, recurrent, moderate: Secondary | ICD-10-CM | POA: Diagnosis not present

## 2017-09-09 DIAGNOSIS — M25561 Pain in right knee: Secondary | ICD-10-CM

## 2017-09-09 DIAGNOSIS — G43109 Migraine with aura, not intractable, without status migrainosus: Secondary | ICD-10-CM

## 2017-09-09 MED ORDER — OXYCODONE HCL ER 10 MG PO T12A: 10.0000 mg | EXTENDED_RELEASE_TABLET | Freq: Two times a day (BID) | ORAL | 0 refills | Status: DC

## 2017-09-09 MED ORDER — AZITHROMYCIN 250 MG PO TABS
ORAL_TABLET | ORAL | 0 refills | Status: DC
Start: 2017-09-09 — End: 2017-12-14

## 2017-09-09 NOTE — Assessment & Plan Note (Signed)
Off Rx 

## 2017-09-09 NOTE — Assessment & Plan Note (Signed)
Chronic FMS/CTD, severe On Oxycontin  Potential benefits of a long term opioids use as well as potential risks (i.e. addiction risk, apnea etc) and complications (i.e. Somnolence, constipation and others) were explained to the patient and were aknowledged.

## 2017-09-09 NOTE — Progress Notes (Signed)
Subjective:  Patient ID: Diane Abbott, female    DOB: May 09, 1934  Age: 83 y.o. MRN: [id number]  CC: No chief complaint on file.   HPI KAZUKO CLEMENCE presents for LBP, CTD, anticoagulation C/o L knee pain and poppping x 4 d, s/p B TKR  Outpatient Medications Prior to Visit  Medication Sig Dispense Refill  . aspirin EC 81 MG tablet Take 81 mg by mouth at bedtime.     . Cholecalciferol (VITAMIN D3) 2000 units capsule Take 1 capsule (2,000 Units total) by mouth daily. 100 capsule 3  . Cyanocobalamin 1000 MCG SUBL Place 1 tablet (1,000 mcg total) under the tongue daily. 100 tablet 11  . Estradiol (VAGIFEM) 10 MCG TABS vaginal tablet INSERT ONE TABLET VAGINALLY EVERY 2 (TWO) DAYS 15 tablet 5  . etanercept (ENBREL SURECLICK) 50 MG/ML injection Inject 0.98 mLs (50 mg total) into the skin once a week. (Patient taking differently: Inject 50 mg into the skin every 14 (fourteen) days. ) 11.76 mL 0  . finasteride (PROSCAR) 5 MG tablet As directed 30 tablet 11  . loratadine (CLARITIN) 10 MG tablet Take 10 mg by mouth daily.    . meclizine (ANTIVERT) 12.5 MG tablet Take 12.5 mg by mouth 3 (three) times daily as needed for dizziness.    Marland Kitchen oxyCODONE (OXYCONTIN) 10 mg 12 hr tablet Take 1 tablet (10 mg total) by mouth 2 (two) times daily. 60 tablet 0  . ranitidine (ZANTAC) 300 MG tablet Take 1 tablet (300 mg total) by mouth at bedtime. (Patient taking differently: Take 300 mg by mouth as needed. ) 30 tablet 11  . rOPINIRole (REQUIP) 4 MG tablet TAKE 1 TABLET BY MOUTH 3 TIMES A DAY 90 tablet 5  . SUMAtriptan (IMITREX) 50 MG tablet 1 po qd prn migraine 10 tablet 3  . Teriparatide, Recombinant, 600 MCG/2.4ML SOLN Inject 0.08 mLs (20 mcg total) into the skin daily. 2.24 mL 2  . triamcinolone ointment (KENALOG) 0.5 % Apply 1 application topically 3 (three) times daily. 90 g 2  . enoxaparin (LOVENOX) 40 MG/0.4ML injection Inject 0.4 mLs (40 mg total) into the skin daily for 2 doses. Use on the travel days  (Patient not taking: Reported on 06/29/2017) 2 Syringe 1   No facility-administered medications prior to visit.     ROS Review of Systems  Constitutional: Positive for fatigue. Negative for activity change, appetite change, chills and unexpected weight change.  HENT: Negative for congestion, mouth sores and sinus pressure.   Eyes: Negative for visual disturbance.  Respiratory: Negative for cough and chest tightness.   Gastrointestinal: Negative for abdominal pain and nausea.  Genitourinary: Negative for difficulty urinating, frequency and vaginal pain.  Musculoskeletal: Positive for arthralgias, back pain and gait problem.  Skin: Negative for pallor and rash.  Neurological: Negative for dizziness, tremors, weakness, numbness and headaches.  Psychiatric/Behavioral: Positive for dysphoric mood. Negative for confusion, sleep disturbance and suicidal ideas. The patient is nervous/anxious.     Objective:  BP 138/76 (BP Location: Left Arm, Patient Position: Sitting, Cuff Size: Normal)   Pulse 80   Temp 98 F (36.7 C) (Oral)   Ht 5\' 1"  (1.549 m)   Wt 158 lb (71.7 kg)   SpO2 97%   BMI 29.85 kg/m   BP Readings from Last 3 Encounters:  09/09/17 138/76  06/29/17 137/77  06/10/17 138/78    Wt Readings from Last 3 Encounters:  09/09/17 158 lb (71.7 kg)  06/29/17 159 lb (72.1 kg)  06/10/17 153  lb (69.4 kg)    Physical Exam  Constitutional: She appears well-developed. No distress.  HENT:  Head: Normocephalic.  Right Ear: External ear normal.  Left Ear: External ear normal.  Nose: Nose normal.  Mouth/Throat: Oropharynx is clear and moist.  Eyes: Pupils are equal, round, and reactive to light. Conjunctivae are normal. Right eye exhibits no discharge. Left eye exhibits no discharge.  Neck: Normal range of motion. Neck supple. No JVD present. No tracheal deviation present. No thyromegaly present.  Cardiovascular: Normal rate and regular rhythm.  Murmur heard. Pulmonary/Chest: No  stridor. No respiratory distress. She has no wheezes.  Abdominal: Soft. Bowel sounds are normal. She exhibits no distension and no mass. There is no tenderness. There is no rebound and no guarding.  Musculoskeletal: She exhibits no edema or tenderness.  Lymphadenopathy:    She has no cervical adenopathy.  Neurological: She displays normal reflexes. No cranial nerve deficit. She exhibits normal muscle tone. Coordination abnormal.  Skin: No rash noted. No erythema.  Psychiatric: She has a normal mood and affect. Her behavior is normal. Judgment and thought content normal.   L knee tender w/ROM LS tender w/ROM   Lab Results  Component Value Date   WBC 3.5 (L) 06/29/2017   HGB 12.6 06/29/2017   HCT 36.2 06/29/2017   PLT 121 (L) 06/29/2017   GLUCOSE 86 06/29/2017   ALT 11 06/29/2017   AST 21 06/29/2017   NA 140 06/29/2017   K 4.7 06/29/2017   CL 106 06/29/2017   CREATININE 1.22 (H) 06/29/2017   BUN 29 (H) 06/29/2017   CO2 28 06/29/2017   TSH 2.88 01/09/2011   INR 1.48 06/29/2016   HGBA1C 5.5 06/24/2016    Dg Chest 2 View  Result Date: 07/29/2016 CLINICAL DATA:  Post aortic arch replacement in 06/2016, some shortness of breath EXAM: CHEST  2 VIEW COMPARISON:  07/01/2016 FINDINGS: Normal heart size post median sternotomy and AVR. Calcified tortuous aorta. Mediastinal contours and pulmonary vascularity otherwise normal. Emphysematous changes without infiltrate, pleural effusion, or pneumothorax. Improved aeration in LEFT lung since prior exam. Osseous demineralization with chronic compression deformity of a vertebra at the thoracolumbar junction. LEFT shoulder prosthesis and RIGHT glenohumeral degenerative changes. IMPRESSION: Postsurgical and emphysematous changes without acute infiltrate. Electronically Signed   By: Lavonia Dana M.D.   On: 07/29/2016 15:25    Assessment & Plan:   There are no diagnoses linked to this encounter. I am having Diane Abbott. Diane Abbott maintain her aspirin EC,  Vitamin D3, Cyanocobalamin, meclizine, loratadine, triamcinolone ointment, ranitidine, Estradiol, SUMAtriptan, etanercept, finasteride, rOPINIRole, enoxaparin, oxyCODONE, and Teriparatide (Recombinant).  No orders of the defined types were placed in this encounter.    Follow-up: No follow-ups on file.  Walker Kehr, MD

## 2017-09-09 NOTE — Assessment & Plan Note (Signed)
Imitrex 

## 2017-09-09 NOTE — Assessment & Plan Note (Signed)
S/p B TKR Dr Wynelle Link 5/19 L Ortho ref The pt declined X ray

## 2017-09-09 NOTE — Assessment & Plan Note (Signed)
On B12 

## 2017-09-10 ENCOUNTER — Telehealth: Payer: Self-pay | Admitting: Rheumatology

## 2017-09-10 NOTE — Telephone Encounter (Signed)
Patient called requesting a prescription refill of Enbrel to be sent to the specialty pharmacy.  Patient states her next Enbrel injection is due on 09/21/17.

## 2017-09-10 NOTE — Telephone Encounter (Signed)
Last Visit: 06/29/17 Next Visit: 10/26/17 Labs: 06/29/17 stable TB Gold: 09/25/16 Neg   Okay to refill per Dr. Estanislado Pandy and prescription sent to [facility].

## 2017-09-12 IMAGING — DX DG THORACIC SPINE 2V
4 series · 4 of 4 positions shown · non-contrast
Comparison: 03/25/2015

CLINICAL DATA: Low back injury

EXAM:
THORACIC SPINE 2 VIEWS

[t-spine ap]
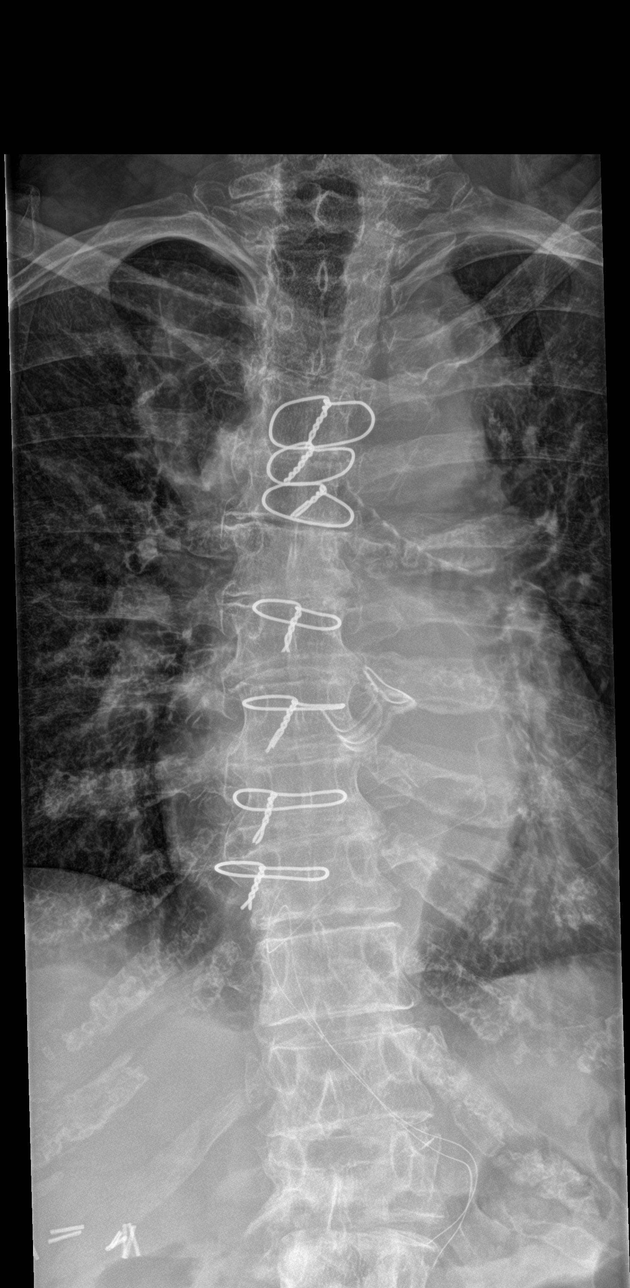

[t-spine lat]
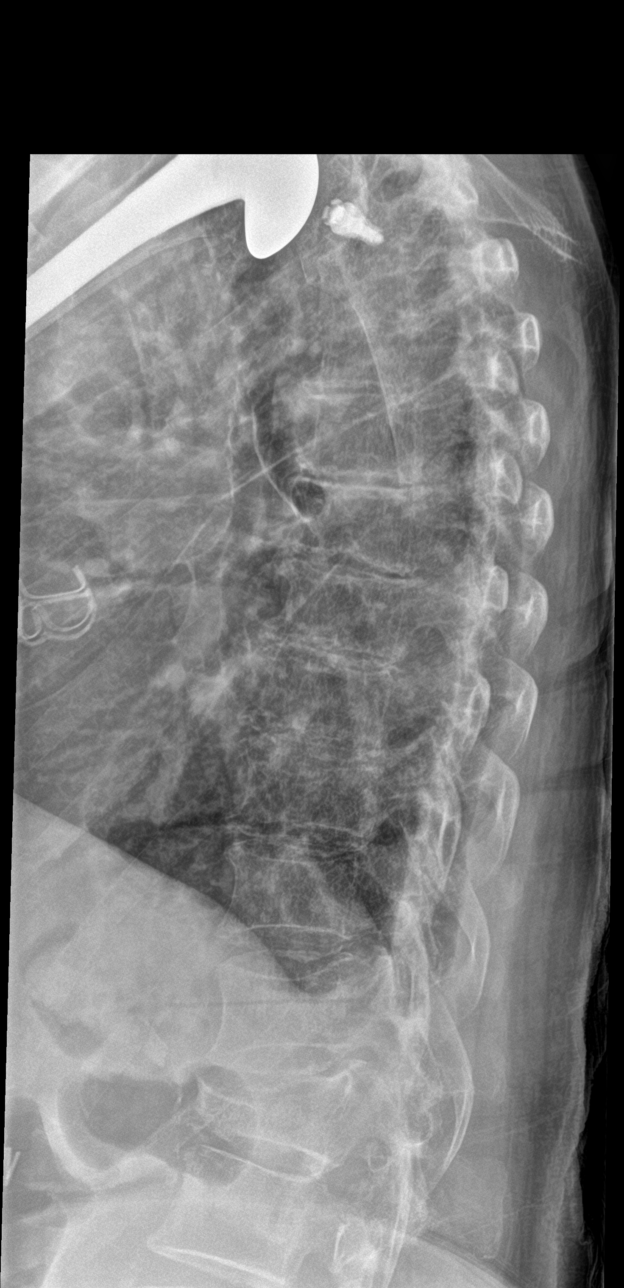

[t-spine swimmers (1 of 2)]
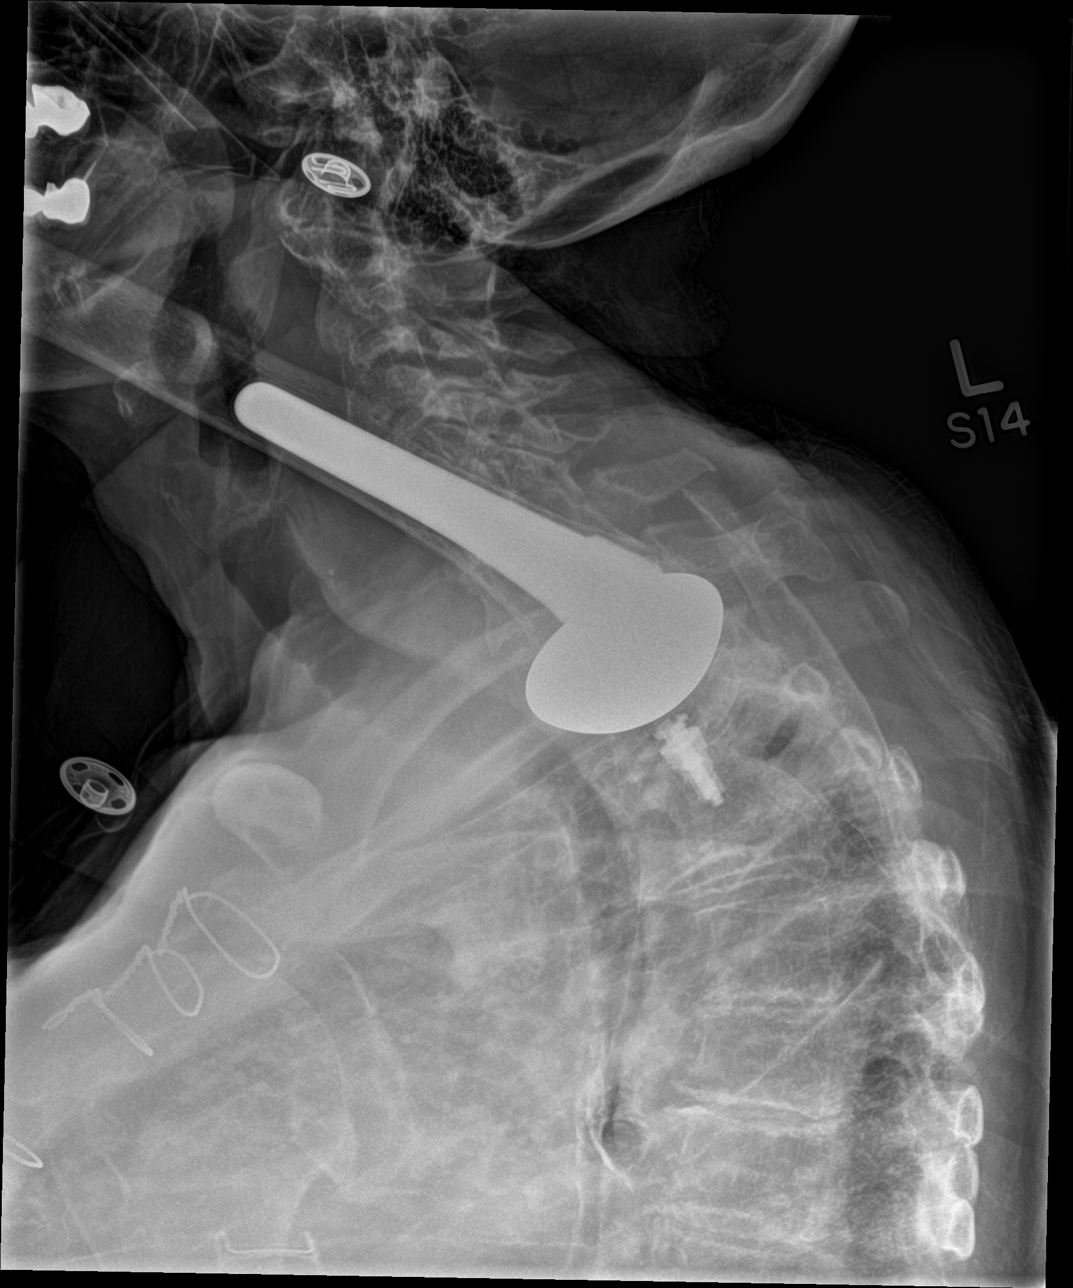

[t-spine swimmers (2 of 2)]
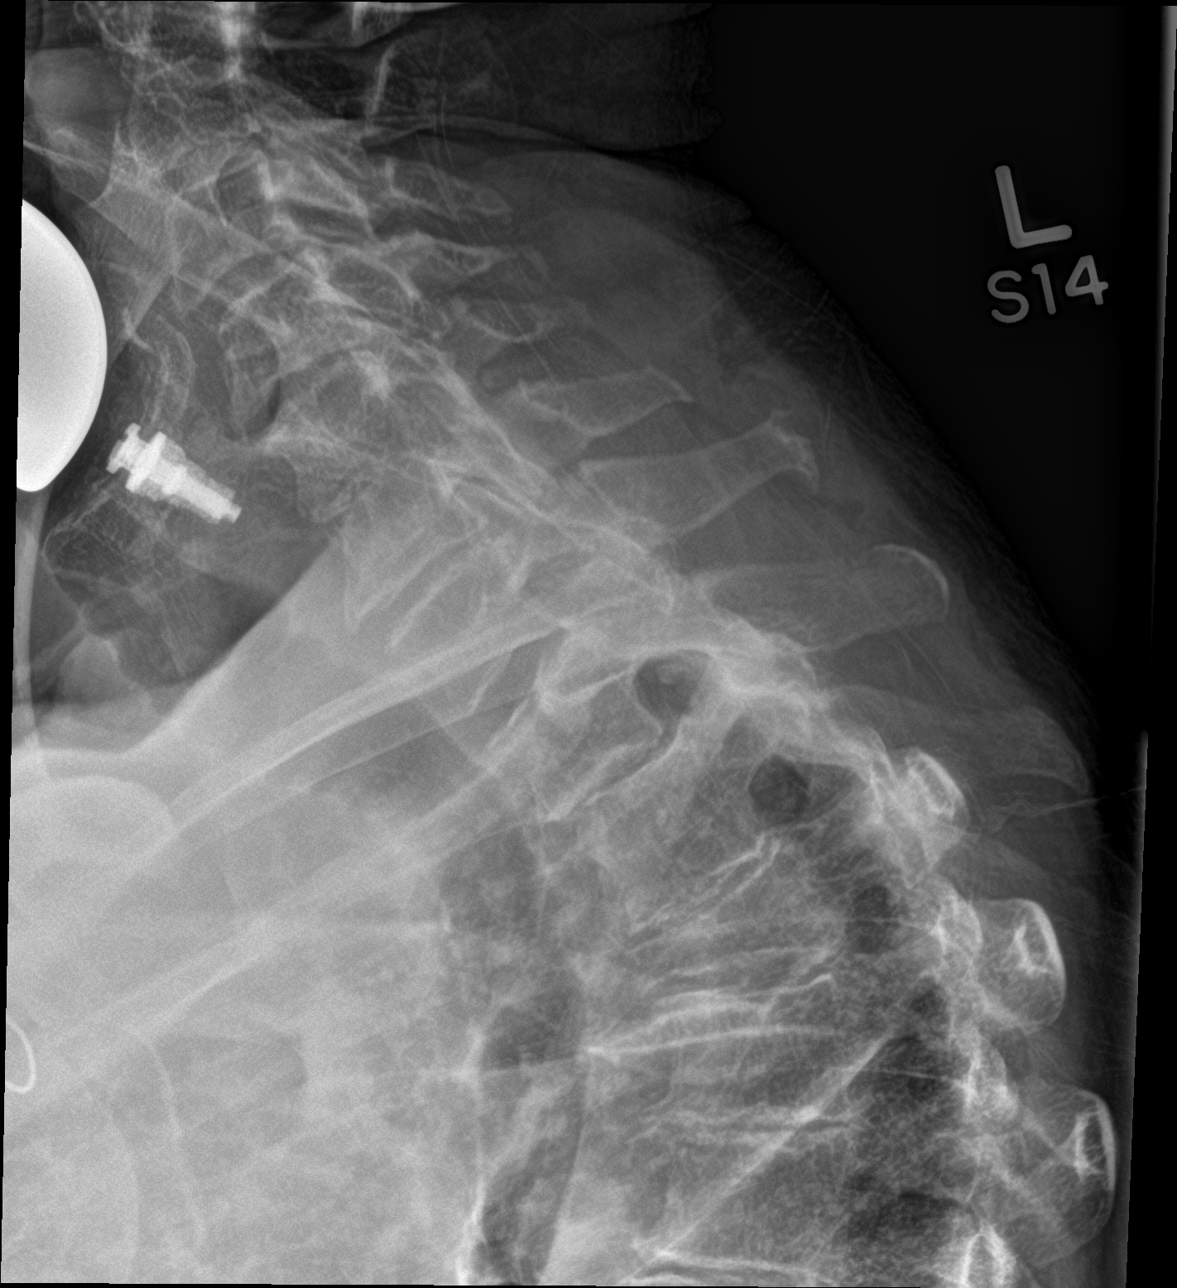

[4 of 4 positions shown; findings below may reference images not displayed]

FINDINGS: T3 and L1 compression fractures are stable. Osteopenia. No new
fracture. Degenerative changes and slight dextroscoliosis are
stable.
IMPRESSION: Stable T3 and L1 compression fractures.  No acute bony pathology.

## 2017-09-22 ENCOUNTER — Encounter: Payer: Self-pay | Admitting: Surgery

## 2017-09-22 ENCOUNTER — Ambulatory Visit
Admission: RE | Admit: 2017-09-22 | Discharge: 2017-09-22 | Disposition: A | Discharge status: Home / Self Care | Payer: Medicare HMO | Source: Ambulatory Visit | Attending: Surgery | Admitting: Surgery

## 2017-09-22 ENCOUNTER — Ambulatory Visit: Payer: Medicare HMO | Admitting: Surgery

## 2017-09-22 ENCOUNTER — Other Ambulatory Visit: Payer: Self-pay

## 2017-09-22 VITALS — BP 127/71 | HR 77 | Resp 18 | Ht 61.0 in | Wt 158.0 lb

## 2017-09-22 DIAGNOSIS — I712 Thoracic aortic aneurysm, without rupture, unspecified: Secondary | ICD-10-CM

## 2017-09-22 DIAGNOSIS — Z954 Presence of other heart-valve replacement: Secondary | ICD-10-CM | POA: Diagnosis not present

## 2017-09-22 MED ORDER — IOPAMIDOL (ISOVUE-370) INJECTION 76%
74.0000 mL | Freq: Once | INTRAVENOUS | Status: AC | PRN
  Administered 2017-09-22: 74 mL via INTRAVENOUS

## 2017-09-22 NOTE — Progress Notes (Signed)
HPI:  The patient is a 83 year old woman with rheumatoid arthritis, fibromyalgia, IBS, DM and multiple other medial problems who underwent Bentall procedure using a composite pericardial valve/conduit and replacement of the ascending aorta and proximal aortic arch on 09/29/2012.  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  Since I saw her one year ago she has started taking Enbrel every 2 weeks for her rheumatoid arthritis.  She has noted some vertigo over the past year which seems to be getting worse and she has had a couple falls.  She said that she has a cane but she is not using it.  She denies any chest pain or shortness of breath.    Current Outpatient Medications  Medication Sig Dispense Refill  . aspirin EC 81 MG tablet Take 81 mg by mouth at bedtime.     Marland Kitchen azithromycin (ZITHROMAX) 250 MG tablet Take 2 tabs 1 hr prior to dental work prn 12 tablet 0  . Cholecalciferol (VITAMIN D3) 2000 units capsule Take 1 capsule (2,000 Units total) by mouth daily. 100 capsule 3  . Cyanocobalamin 1000 MCG SUBL Place 1 tablet (1,000 mcg total) under the tongue daily. 100 tablet 11  . Estradiol (VAGIFEM) 10 MCG TABS vaginal tablet INSERT ONE TABLET VAGINALLY EVERY 2 (TWO) DAYS 15 tablet 5  . etanercept (ENBREL SURECLICK) 50 MG/ML injection Inject 0.98 mLs (50 mg total) into the skin once a week. (Patient taking differently: Inject 50 mg into the skin every 14 (fourteen) days. ) 11.76 mL 0  . finasteride (PROSCAR) 5 MG tablet As directed 30 tablet 11  . loratadine (CLARITIN) 10 MG tablet Take 10 mg by mouth daily.    . meclizine (ANTIVERT) 12.5 MG tablet Take 12.5 mg by mouth 3 (three) times daily as needed for dizziness.    Marland Kitchen oxyCODONE (OXYCONTIN) 10 mg 12 hr tablet Take 1 tablet (10 mg total) by mouth 2 (two) times daily. 60 tablet 0  .  ranitidine (ZANTAC) 300 MG tablet Take 1 tablet (300 mg total) by mouth at bedtime. (Patient taking differently: Take 300 mg by mouth as needed. ) 30 tablet 11  . rOPINIRole (REQUIP) 4 MG tablet TAKE 1 TABLET BY MOUTH 3 TIMES A DAY 90 tablet 5  . SUMAtriptan (IMITREX) 50 MG tablet 1 po qd prn migraine 10 tablet 3  . Teriparatide, Recombinant, 600 MCG/2.4ML SOLN Inject 0.08 mLs (20 mcg total) into the skin daily. 2.24 mL 2  . triamcinolone ointment (KENALOG) 0.5 % Apply 1 application topically 3 (three) times daily. 90 g 2  . enoxaparin (LOVENOX) 40 MG/0.4ML injection Inject 0.4 mLs (40 mg total) into the skin daily for 2 doses. Use on the travel days (Patient not taking: Reported on 06/29/2017) 2 Syringe 1   No current facility-administered medications for this visit.      Physical Exam: BP 127/71 (BP Location: Left Arm, Patient Position: Sitting, Cuff Size: Normal)   Pulse 77   Resp 18   Ht 5\' 1"  (1.549 m)   Wt 158 lb (71.7 kg)   SpO2 98% Comment: RA  BMI 29.85 kg/m  She looks well for her age. Cardiac exam shows a regular rate and rhythm with a grade 2 out of 6 systolic murmur heard throughout the precordium. Lungs are clear. There is no peripheral edema.  Diagnostic Tests:  CLINICAL DATA:  Status  post aortic arch replacement on 06/29/2016 and prior Bentall procedure in 2014.  EXAM: CT ANGIOGRAPHY CHEST WITH CONTRAST  TECHNIQUE: Multidetector CT imaging of the chest was performed using the standard protocol during bolus administration of intravenous contrast. Multiplanar CT image reconstructions and MIPs were obtained to evaluate the vascular anatomy.  CONTRAST:  12mL ISOVUE-370 IOPAMIDOL (ISOVUE-370) INJECTION 76%  Creatinine was obtained on site at [facility] at [address]  Results: Creatinine 1.4 mg/dL. Estimated GFR 35 mL/minute. Reduce contrast dosing was administered.  COMPARISON:  05/27/2016  FINDINGS: Cardiovascular: After  replacement of the aortic arch, there is normal patency of the aortic root, ascending thoracic aortic graft and new aortic arch graft with no evidence aneurysmal disease. Proximal and distal anastomoses show normal patency. Anastomoses of the innominate artery and left common carotid artery are normally patent. There is focal mild contrast filled protrusion along the anterior aspect of the arch graft just beyond the takeoff of the left common carotid artery. This measures approximately 5 x 10 mm and can be followed on subsequent CTA studies. Tortuous left subclavian artery shows normal patency. No evidence of aortic dissection. Native descending thoracic aorta shows stable caliber and tortuosity with maximal caliber of 3.9 cm just beyond the arch.  The heart size is stable. No pericardial fluid identified. Stable appearance of prosthetic aortic valve. Central pulmonary arteries are of normal caliber and normally patent.  Mediastinum/Nodes: No enlarged mediastinal, hilar, or axillary lymph nodes. Thyroid gland, trachea, and esophagus demonstrate no significant findings.  Lungs/Pleura: Scarring at both lung bases, left greater than right. There is no evidence of pulmonary edema, consolidation, pneumothorax, nodule or pleural fluid.  Upper Abdomen: No acute abnormality.  Musculoskeletal: Stable compression deformity of the L1 vertebral body. Stable milder compression of the T3 vertebral body.  Review of the MIP images confirms the above findings.  IMPRESSION: 1. Normal patency of replaced aortic arch without residual aneurysmal disease. Implanted innominate and left common carotid artery show normal patency. 2. There is a small focal outpouching of the lumen of the graft anteriorly just beyond the takeoff of the left common carotid artery measuring approximately 5 x 10 mm and communicating with the lumen of the graft. This is presumably postoperative and can be followed on  future imaging studies.   Electronically Signed   By: Aletta Edouard M.D.   On: 09/22/2017 17:11     *[facility]*                        [address], [place] [address]                            [phone]  ------------------------------------------------------------------- Echocardiography  Patient:    Haivyn, Oravec MR #:       [id number] Study Date: 08/31/2017 Gender:     F Age:        45 Height:     154.9 cm Weight:     72.1 kg BSA:        1.79 m^2 Pt. Status: Room:   ATTENDING    Gilford Raid, MD  ORDERING     Gilford Raid, MD  [place], MD  SONOGRAPHER  [facility], Outpatient  cc:  ------------------------------------------------------------------- LV EF: 55% -   60%  ------------------------------------------------------------------- Indications:      (I71.2).  ------------------------------------------------------------------- History:   PMH:  S/p thoracic aortic aneurysm repair 2018. Bentall procedure 2014. Acquired from the patient and from the patient&'s chart.  Risk factors:  Diabetes mellitus.  ------------------------------------------------------------------- Study Conclusions  - Left ventricle: The cavity size was normal. There was mild   concentric hypertrophy. Systolic function was normal. The   estimated ejection fraction was in the range of 55% to 60%. Wall   motion was normal; there were no regional wall motion   abnormalities. Features are consistent with a pseudonormal left   ventricular filling pattern, with concomitant abnormal relaxation   and increased filling pressure (grade 2 diastolic dysfunction).   Doppler parameters are consistent with indeterminate ventricular   filling pressure. - Aortic valve: A bioprosthesis was present. Peak velocity (S): 303   cm/s. Mean gradient (S): 19 mm Hg. - Aorta: Ascending aortic diameter: 37 mm  (S). - Ascending aorta: The ascending aorta was mildly dilated. - Mitral valve: Calcified annulus. Transvalvular velocity was   within the normal range. There was no evidence for stenosis.   There was trivial regurgitation. - Left atrium: The atrium was severely dilated. - Right ventricle: The cavity size was normal. Wall thickness was   normal. Systolic function was normal. - Atrial septum: No defect or patent foramen ovale was identified. - Tricuspid valve: There was moderate regurgitation. - Pulmonary arteries: Systolic pressure was within the normal   range. PA peak pressure: 32 mm Hg (S).  Impressions:  - Bioprosthetic aortic valve functioning properly. Gradients mildly   increased and unchanged from 06/2016.  ------------------------------------------------------------------- Study data:  Comparison was made to the study of 06/11/2016.  Study status:  Routine.  Procedure:  The patient reported no pain pre or post test. Transthoracic echocardiography for left ventricular function evaluation. Image quality was adequate.  Study completion:  There were no complications.          Echocardiography.  M-mode, complete 2D, spectral Doppler, and color Doppler.  Birthdate: Patient birthdate: July 27, 1934.  Age:  Patient is 83 yr old.  Sex: Gender: female.    BMI: 30.1 kg/m^2.  Blood pressure:     137/77 Patient status:  Outpatient.  Study date:  Study date: 08/31/2017. Study time: 11:51 AM.  Location:  [facility]  -------------------------------------------------------------------  ------------------------------------------------------------------- Left ventricle:  The cavity size was normal. There was mild concentric hypertrophy. Systolic function was normal. The estimated ejection fraction was in the range of 55% to 60%. Wall motion was normal; there were no regional wall motion abnormalities. Features are consistent with a pseudonormal left ventricular filling pattern,  with concomitant abnormal relaxation and increased filling pressure (grade 2 diastolic dysfunction). Doppler parameters are consistent with indeterminate ventricular filling pressure.  ------------------------------------------------------------------- Aortic valve:  A bioprosthesis was present. Mobility was not restricted.  Doppler:  Transvalvular velocity was mildly increased. There was no stenosis. There was no regurgitation.    VTI ratio of LVOT to aortic valve: 0.31. Valve area (VTI): 0.79 cm^2. Indexed valve area (VTI): 0.44 cm^2/m^2. Peak velocity ratio of LVOT to aortic valve: 0.3. Valve area (Vmax): 0.77 cm^2. Indexed valve area (Vmax): 0.43 cm^2/m^2. Mean velocity ratio of LVOT to aortic valve: 0.33. Valve area (Vmean): 0.83 cm^2. Indexed valve area (Vmean): 0.46 cm^2/m^2.    Mean gradient (S): 19 mm Hg. Peak gradient (S): 37 mm Hg.  ------------------------------------------------------------------- Aorta:  Aortic root: The aortic root was  normal in size. Ascending aorta: The ascending aorta was mildly dilated.  ------------------------------------------------------------------- Mitral valve:   Calcified annulus. Mobility was not restricted. Doppler:  Transvalvular velocity was within the normal range. There was no evidence for stenosis. There was trivial regurgitation. Peak gradient (D): 5 mm Hg.  ------------------------------------------------------------------- Left atrium:  The atrium was severely dilated.  ------------------------------------------------------------------- Atrial septum:  No defect or patent foramen ovale was identified.   ------------------------------------------------------------------- Right ventricle:  The cavity size was normal. Wall thickness was normal. Systolic function was normal.  ------------------------------------------------------------------- Pulmonic valve:    Doppler:  Transvalvular velocity was within the normal range.  There was no evidence for stenosis.  ------------------------------------------------------------------- Tricuspid valve:   Structurally normal valve.    Doppler: Transvalvular velocity was within the normal range. There was moderate regurgitation.  ------------------------------------------------------------------- Pulmonary artery:   The main pulmonary artery was normal-sized. Systolic pressure was within the normal range.  ------------------------------------------------------------------- Right atrium:  The atrium was normal in size.  ------------------------------------------------------------------- Pericardium:  There was no pericardial effusion.  ------------------------------------------------------------------- Systemic veins: Inferior vena cava: The vessel was normal in size. The respirophasic diameter changes were in the normal range (>= 50%), consistent with normal central venous pressure.  ------------------------------------------------------------------- Measurements   Left ventricle                            Value          Reference  LV ID, ED, PLAX chordal           (L)     40.3  mm       43 - 52  LV ID, ES, PLAX chordal                   27.4  mm       23 - 38  LV fx shortening, PLAX chordal            32    %        >=29  LV PW thickness, ED                       11.5  mm       ---------  IVS/LV PW ratio, ED                       1              <=1.3  Stroke volume, 2D                         58    ml       ---------  Stroke volume/bsa, 2D                     32    ml/m^2   ---------  LV ejection fraction, 1-p A4C             55    %        ---------  LV end-diastolic volume, 2-p              77    ml       ---------  LV end-systolic volume, 2-p               33    ml       ---------  LV ejection fraction, 2-p  57    %        ---------  Stroke volume, 2-p                        44    ml       ---------  LV end-diastolic volume/bsa, 2-p           43    ml/m^2   ---------  LV end-systolic volume/bsa, 2-p           18    ml/m^2   ---------  Stroke volume/bsa, 2-p                    24.6  ml/m^2   ---------  LV e&', lateral                            9.94  cm/s     ---------  LV E/e&', lateral                          11.17          ---------  LV e&', medial                             7.8   cm/s     ---------  LV E/e&', medial                           14.23          ---------  LV e&', average                            8.87  cm/s     ---------  LV E/e&', average                          12.51          ---------    Ventricular septum                        Value          Reference  IVS thickness, ED                         11.5  mm       ---------    LVOT                                      Value          Reference  LVOT ID, S                                18    mm       ---------  LVOT area                                 2.54  cm^2     ---------  LVOT ID  18    mm       ---------  LVOT peak velocity, S                     91.7  cm/s     ---------  LVOT mean velocity, S                     66.4  cm/s     ---------  LVOT VTI, S                               22.9  cm       ---------  LVOT peak gradient, S                     3     mm Hg    ---------  Stroke volume (SV), LVOT DP               58.3  ml       ---------  Stroke index (SV/bsa), LVOT DP            32.6  ml/m^2   ---------    Aortic valve                              Value          Reference  Aortic valve peak velocity, S             303   cm/s     ---------  Aortic valve mean velocity, S             203   cm/s     ---------  Aortic valve VTI, S                       73.4  cm       ---------  Aortic mean gradient, S                   19    mm Hg    ---------  Aortic peak gradient, S                   37    mm Hg    ---------  VTI ratio, LVOT/AV                        0.31           ---------  Aortic valve area, VTI                     0.79  cm^2     ---------  Aortic valve area/bsa, VTI                0.44  cm^2/m^2 ---------  Velocity ratio, peak, LVOT/AV             0.3            ---------  Aortic valve area, peak velocity          0.77  cm^2     ---------  Aortic valve area/bsa, peak               0.43  cm^2/m^2 ---------  velocity  Velocity ratio, mean, LVOT/AV  0.33           ---------  Aortic valve area, mean velocity          0.83  cm^2     ---------  Aortic valve area/bsa, mean               0.46  cm^2/m^2 ---------  velocity    Aorta                                     Value          Reference  Aortic root ID, ED                        34    mm       ---------  Ascending aorta ID, A-P, S                37    mm       ---------    Left atrium                               Value          Reference  LA ID, A-P, ES                            35    mm       ---------  LA ID/bsa, A-P                            1.96  cm/m^2   <=2.2  LA volume, S                              65    ml       ---------  LA volume/bsa, S                          36.4  ml/m^2   ---------  LA volume, ES, 1-p A4C                    53    ml       ---------  LA volume/bsa, ES, 1-p A4C                29.6  ml/m^2   ---------  LA volume, ES, 1-p A2C                    74    ml       ---------  LA volume/bsa, ES, 1-p A2C                41.4  ml/m^2   ---------    Mitral valve                              Value          Reference  Mitral E-wave peak velocity               111   cm/s     ---------  Mitral A-wave peak velocity  101   cm/s     ---------  Mitral deceleration time          (H)     257   ms       150 - 230  Mitral peak gradient, D                   5     mm Hg    ---------  Mitral E/A ratio, peak                    1.1            ---------    Pulmonary arteries                        Value          Reference  PA pressure, S, DP                (H)     32    mm Hg    <=30    Tricuspid valve                            Value          Reference  Tricuspid regurg peak velocity            270   cm/s     ---------  Tricuspid peak RV-RA gradient             29    mm Hg    ---------    Systemic veins                            Value          Reference  Estimated CVP                             3     mm Hg    ---------    Right ventricle                           Value          Reference  RV pressure, S, DP                (H)     32    mm Hg    <=30  RV s&', lateral, S                         8.97  cm/s     ---------  Legend: (L)  and  (H)  mark values outside specified reference range.  ------------------------------------------------------------------- Prepared and Electronically Authenticated by  Skeet Latch, MD 2019-04-30T16:29:03   Impression:  Overall I think she is doing well considering her age and underlying medical problems.  I have personally reviewed her echocardiogram and CTA of the chest.  Her echocardiogram shows a bioprosthetic aortic valve with normal leaflet mobility.  There is no significant calcification.  The mean gradient across the valve is 19 mmHg which is unchanged from her prior echo in February 2018.  She has a 21 mm valve and this echo was about what I would expect.  There is no insufficiency.  Left ventricular systolic function is  normal with ejection fraction of 55 to 60%.  There is grade 2 diastolic dysfunction.  The CTA of the chest shows normal patency of the ascending aortic and arch grafts with patent graft to the innominate and left carotid arteries.  The distal arch and descending thoracic aorta are mildly aneurysmal with a maximum diameter of 4.0 cm in the proximal descending aorta.  This is unchanged from her prior CT scan.  There is elongation and tortuosity of the descending aorta.  The focal outpouching of the lumen of the ascending graft just beyond the takeoff of the left common carotid artery is the stump of the 10 mm side arm graft which was  suture-ligated.  Her blood pressure is under good control.  I advised her against doing heavy lifting of more than 35 pounds since a Valsalva maneuver could potentially result in a marked sudden rise in her blood pressure that could lead to aortic dissection.  I also advised her against taking any quinolone antibiotics which have been associated with enlargement of aortic aneurysms.  Plan:  I will plan to see her back in 1 year with a CTA of the chest and a 2D echocardiogram.  I spent 15 minutes performing this established patient evaluation and > 50% of this time was spent face to face counseling and coordinating the care of this patient's aortic aneurysm and prosthetic aortic valve.  Gaye Pollack, MD [facility] [phone]

## 2017-10-12 ENCOUNTER — Other Ambulatory Visit: Payer: Self-pay | Admitting: Internal Medicine

## 2017-10-13 ENCOUNTER — Ambulatory Visit: Payer: Medicare HMO | Admitting: Rheumatology

## 2017-10-18 NOTE — Progress Notes (Deleted)
Office Visit Note  Patient: Diane Abbott             Date of Birth: 06-14-1934           MRN: [id number]             PCP: Cassandria Anger, MD Referring: Cassandria Anger, MD Visit Date: 10/26/2017 Occupation: @GUAROCC @    Subjective:  No chief complaint on file.   History of Present Illness: Diane Abbott is a 83 y.o. female ***   Activities of Daily Living:  Patient reports morning stiffness for *** {minute/hour:19697}.   Patient {ACTIONS;DENIES/REPORTS:21021675::"Denies"} nocturnal pain.  Difficulty dressing/grooming: {ACTIONS;DENIES/REPORTS:21021675::"Denies"} Difficulty climbing stairs: {ACTIONS;DENIES/REPORTS:21021675::"Denies"} Difficulty getting out of chair: {ACTIONS;DENIES/REPORTS:21021675::"Denies"} Difficulty using hands for taps, buttons, cutlery, and/or writing: {ACTIONS;DENIES/REPORTS:21021675::"Denies"}   No Rheumatology ROS completed.   PMFS History:  Patient Active Problem List   Diagnosis Date Noted  . Alopecia 04/05/2017  . Migraines 02/01/2017  . History of vertebral fracture 11/14/2016  . 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 History of left shoulder replacement 11/13/2016  . History of total knee replacement, bilateral 11/13/2016  . History of discitis and osteomyelitis vertebrae 09/17/2016  . History of DVT (deep vein thrombosis) and PE 09/17/2016  . History of chronic kidney disease 09/17/2016  . History of myeloproliferative disorder 09/17/2016  . History of IBS 09/17/2016  . Foot pain, left 08/26/2016  . H/O aortic arch replacement 07/03/2016  . Thoracic ascending aortic aneurysm ([place]) 06/29/2016  . Upper respiratory infection 06/05/2016  . Anxiety state 01/02/2016  . Callus of foot 12/02/2015  . Leukopenia 11/11/2015  . Diskitis 08/29/2015  . Acute osteomyelitis of lumbar spine ([place]) 07/29/2015  . Thoracic compression fracture ([place]) 07/29/2015  . Discitis of lumbosacral region   . Epidural abscess   . Chronic renal  insufficiency, stage III (moderate) ([place]) 02/28/2015  . Hammer toe, acquired 11/29/2014  . Actinic keratoses 01/25/2014  . Thrombocytopenia due to drugs 11/24/2013  . Knee pain, acute 07/25/2013  . Atrophic vaginitis 03/23/2013  . S/P ascending aortic replacement 10/04/2012  . S/P AVR (aortic valve replacement) 10/04/2012  . Aortic aneurysm, thoracic ([place]) 09/14/2012  . Aortic insufficiency 09/14/2012  . Hyperglycemia 07/27/2012  . Wound, open, forearm 03/21/2012  . Osteoarthritis of left shoulder 12/07/2011  . Breast lump in female 04/07/2011  . COPD (chronic obstructive pulmonary disease) ([place]) 03/12/2011  . Esophagitis, unspecified 01/09/2011  . Insomnia 11/04/2010  . RESTLESS LEG SYNDROME 02/03/2010  . THRUSH 07/04/2009  . PRURITUS 05/16/2009  . PULMONARY EMBOLISM 09/14/2008  . Constipation 09/14/2008  . Edema 01/09/2008  . Pneumonia, organism unspecified(486) 07/13/2007  . Chest pain 07/13/2007  . Acute thromboembolism of deep veins of lower extremity ([place]) 07/08/2007  . GERD 07/08/2007  . Irritable bowel syndrome 07/08/2007  . Chronic pain 07/08/2007  . CYSTITIS 05/31/2007  . Depression 05/11/2007  . B12 deficiency 03/10/2007  . Vitamin D deficiency 03/10/2007  . OSTEOARTHRITIS 03/10/2007  . Osteopenia 03/10/2007  . VERTIGO 03/10/2007  . PULMONARY EMBOLISM, HX OF 03/10/2007  . Rheumatoid arthritis ([place]) 11/23/2006    Past Medical History:  Diagnosis Date  . Adrenal insufficiency ([place])   . Anemia   . Anginal pain [facility])    "comes and goes has occured since 2012"  . Blood dyscrasia    "problems with platelets, red cells and white cells being low"  . Bronchitis Jan 2013-March 2013   severe  . Cancer ([place])    skin cancer on face  . Cataracts, bilateral  more in left than right  . Depression   . Esophagitis   . Fatty liver   . Fibromyalgia   . Fibromyalgia   . Foot pain, left 08/26/2016   2/18 1st MTP - gout vs other  . GERD (gastroesophageal reflux  disease)   . Heart murmur   . History of blood clots 2008   below knee post trauma. Dilated [facility] no clotting disorder felt to be present.  . History of echocardiogram    Echo 11/16: Mild LVH, EF 60-65%, normal wall motion, grade 1 diastolic dysfunction, bioprosthetic AVR with mean gradient 21 mmHg and no regurgitation, proximal aortic arch aneurysm 49 mm, moderate TR, PASP 31 mmHg  . IBS (irritable bowel syndrome)   . Leaky heart valve   . Osteoarthritis   . Osteoarthritis of left shoulder 12/07/2011  . Osteopenia   . Pulmonary embolism ([place])   . RA (rheumatoid arthritis) ([place])   . Shortness of breath    attributed to aneurysm  . Sliding hiatal hernia   . Urinary leakage    when coughing  . Vertigo    hx of  . Vitamin B 12 deficiency   . Vitamin D deficiency     Family History  Problem Relation Age of Onset  . Ovarian cancer Mother   . Diabetes Mother   . COPD Father   . Heart disease Maternal Grandmother   . Clotting disorder Unknown        Father, brother, sister, and daughter all had deep venous thrombosis  . Breast cancer Unknown   . Pancreatic cancer Paternal Uncle   . Colon cancer Neg Hx    Past Surgical History:  Procedure Laterality Date  . ABDOMINAL HYSTERECTOMY    . AORTA SURGERY  12/2012   Resection of aneurysm and valve replacement  . APPENDECTOMY  1956  . BENTALL PROCEDURE N/A 09/29/2012   Procedure: BENTALL PROCEDURE;  Surgeon: Gaye Pollack, MD;  Location: [place];  Service: Open Heart Surgery;  Laterality: N/A;  WITH CIRC ARREST  . BILATERAL OOPHORECTOMY    . BREAST BIOPSY Left   . CARDIAC CATHETERIZATION  09/15/12   no PCI  . CATARACT EXTRACTION Bilateral   . CHOLECYSTECTOMY  2003  . COLONOSCOPY    . COSMETIC SURGERY     Tummy tuck  . INTRAOPERATIVE TRANSESOPHAGEAL ECHOCARDIOGRAM N/A 09/29/2012   Procedure: INTRAOPERATIVE TRANSESOPHAGEAL ECHOCARDIOGRAM;  Surgeon: Gaye Pollack, MD;  Location[facility];  Service: Open Heart Surgery;  Laterality:  N/A;  . JOINT REPLACEMENT Bilateral   . REPLACEMENT ASCENDING AORTA N/A 06/29/2016   Procedure: AORTIC ARCH REPLACEMENT WITH CIRC ARREST (N/A) - RIGHT AXILLARY CANNULATION  AORTO-AORTIC 28 HEMASHIELD GRAFT WITH AORTO-INNOMINATE AND AORTO-LEFT CAROTID ANASTAMOSIS;  Surgeon: Gaye Pollack, MD;  Location: [place] OR;  Service: Open Heart Surgery;  Laterality: N/A;  RIGHT AXILLARY CANNULATION  BILATERAL RADIAL A-LINE  . SKIN CANCER EXCISION Right    cheek  . TEE WITHOUT CARDIOVERSION N/A 06/29/2016   Procedure: TRANSESOPHAGEAL ECHOCARDIOGRAM (TEE);  Surgeon: Gaye Pollack, MD;  Location: [place];  Service: Open Heart Surgery;  Laterality: N/A;  . TONSILLECTOMY  1941  . TOTAL KNEE ARTHROPLASTY     bilateral  . TOTAL SHOULDER ARTHROPLASTY  12/07/2011   Procedure: TOTAL SHOULDER ARTHROPLASTY;  Surgeon: Johnny Bridge, MD;  Location: [place];  Service: Orthopedics;  Laterality: Left;  left total shoulder artthroplasty  . WRIST SURGERY     bilateral   Social History   Social  History Narrative  . Not on file     Objective: Vital Signs: There were no vitals taken for this visit.   Physical Exam   Musculoskeletal Exam: ***  CDAI Exam: No CDAI exam completed.    Investigation: No additional findings. CBC Latest Ref Rng & Units 06/29/2017 04/05/2017 02/18/2017  WBC 3.8 - 10.8 Thousand/uL 3.5(L) 3.5(L) 3.6(L)  Hemoglobin 11.7 - 15.5 g/dL 12.6 12.7 11.9  Hematocrit 35.0 - 45.0 % 36.2 38.4 35.4  Platelets 140 - 400 Thousand/uL 121(L) 147.0(L) 132(L)   CMP Latest Ref Rng & Units 06/29/2017 04/05/2017 02/18/2017  Glucose 65 - 99 mg/dL 86 108(H) 90  BUN 7 - 25 mg/dL 29(H) 34(H) 26(H)  Creatinine 0.60 - 0.88 mg/dL 1.22(H) 1.35(H) 1.35(H)  Sodium 135 - 146 mmol/L 140 138 141  Potassium 3.5 - 5.3 mmol/L 4.7 5.1 5.2  Chloride 98 - 110 mmol/L 106 104 105  CO2 20 - 32 mmol/L 28 25 30   Calcium 8.6 - 10.4 mg/dL 9.3 10.0 9.1  Total Protein 6.1 - 8.1 g/dL 7.4 8.1 7.1  Total Bilirubin 0.2 - 1.2 mg/dL 0.5  0.5 0.4  Alkaline Phos 39 - 117 U/L - 85 -  AST 10 - 35 U/L 21 19 16   ALT 6 - 29 U/L 11 9 7      Imaging: Ct Angio Chest Aorta W &/or Wo Contrast  Result Date: 09/22/2017 CLINICAL DATA:  Status post aortic arch replacement on 06/29/2016 and prior Bentall procedure in 2014. EXAM: CT ANGIOGRAPHY CHEST WITH CONTRAST TECHNIQUE: Multidetector CT imaging of the chest was performed using the standard protocol during bolus administration of intravenous contrast. Multiplanar CT image reconstructions and MIPs were obtained to evaluate the vascular anatomy. CONTRAST:  85mL ISOVUE-370 IOPAMIDOL (ISOVUE-370) INJECTION 76% Creatinine was obtained on site at [place] at [address] Results: Creatinine 1.4 mg/dL. Estimated GFR 35 mL/minute. Reduce contrast dosing was administered. COMPARISON:  05/27/2016 FINDINGS: Cardiovascular: After replacement of the aortic arch, there is normal patency of the aortic root, ascending thoracic aortic graft and new aortic arch graft with no evidence aneurysmal disease. Proximal and distal anastomoses show normal patency. Anastomoses of the innominate artery and left common carotid artery are normally patent. There is focal mild contrast filled protrusion along the anterior aspect of the arch graft just beyond the takeoff of the left common carotid artery. This measures approximately 5 x 10 mm and can be followed on subsequent CTA studies. Tortuous left subclavian artery shows normal patency. No evidence of aortic dissection. Native descending thoracic aorta shows stable caliber and tortuosity with maximal caliber of 3.9 cm just beyond the arch. The heart size is stable. No pericardial fluid identified. Stable appearance of prosthetic aortic valve. Central pulmonary arteries are of normal caliber and normally patent. Mediastinum/Nodes: No enlarged mediastinal, hilar, or axillary lymph nodes. Thyroid gland, trachea, and esophagus demonstrate no significant findings.  Lungs/Pleura: Scarring at both lung bases, left greater than right. There is no evidence of pulmonary edema, consolidation, pneumothorax, nodule or pleural fluid. Upper Abdomen: No acute abnormality. Musculoskeletal: Stable compression deformity of the L1 vertebral body. Stable milder compression of the T3 vertebral body. Review of the MIP images confirms the above findings. IMPRESSION: 1. Normal patency of replaced aortic arch without residual aneurysmal disease. Implanted innominate and left common carotid artery show normal patency. 2. There is a small focal outpouching of the lumen of the graft anteriorly just beyond the takeoff of the left common carotid artery measuring approximately 5 x 10  mm and communicating with the lumen of the graft. This is presumably postoperative and can be followed on future imaging studies. Electronically Signed   By: Aletta Edouard M.D.   On: 09/22/2017 17:11    Speciality Comments: No specialty comments available.    Procedures:  No procedures performed Allergies: Acetaminophen; Codeine; Pramipexole dihydrochloride; Rofecoxib; Venlafaxine; Arava [leflunomide]; Clonazepam; Diclofenac sodium; Alprazolam; Darvocet [propoxyphene n-acetaminophen]; Duloxetine; Gabapentin; Latex; Morphine and related; Nortriptyline hcl; Penicillins; and Prednisone   Assessment / Plan:     Visit Diagnoses: No diagnosis found.    Orders: No orders of the defined types were placed in this encounter.  No orders of the defined types were placed in this encounter.   Face-to-face time spent with patient was *** minutes. 50% of time was spent in counseling and coordination of care.  Follow-Up Instructions: No follow-ups on file.   Ofilia Neas, PA-C  Note - This record has been created using Dragon software.  Chart creation errors have been sought, but may not always  have been located. Such creation errors do not reflect on  the standard of medical care.

## 2017-10-22 DIAGNOSIS — Z96652 Presence of left artificial knee joint: Secondary | ICD-10-CM | POA: Diagnosis not present

## 2017-10-22 DIAGNOSIS — Z96651 Presence of right artificial knee joint: Secondary | ICD-10-CM | POA: Diagnosis not present

## 2017-10-22 DIAGNOSIS — M25362 Other instability, left knee: Secondary | ICD-10-CM | POA: Diagnosis not present

## 2017-10-26 ENCOUNTER — Ambulatory Visit: Payer: Medicare HMO | Admitting: Physician Assistant

## 2017-11-01 NOTE — Progress Notes (Signed)
Office Visit Note  Patient: Diane Abbott             Date of Birth: [birth year]           MRN: [id number]             PCP: Cassandria Anger, MD Referring: Cassandria Anger, MD Visit Date: 11/15/2017 Occupation: @GUAROCC @    Subjective:  Pain in multiple joints  History of Present Illness: Diane Abbott is a 83 y.o. female with history of seronegative rheumatoid arthritis, fibromyalgia, and osteoporosis.  She has been injecting Enbrel subcutaneously every 14 days.  For management of osteoporosis she has been injecting Forteo few days injections once daily.  She denies missing any doses of Enbrel.  She reports that she has had bronchitis for 1 week.  She reports that she has been off oxycodone for 5 weeks is still having increased pain in multiple joints as well as fibromyalgia muscle tenderness and muscle aches.  She states she is having pain in bilateral hands but denies any joint swelling.  She states she still able to play the piano on a regular basis.  She states she also has chronic pain in bilateral feet and bilateral ankles.  She reports that she has edema bilateral ankles but denies any joint swelling.  She denies any pain in her left shoulder and states that she has good range of motion.  She has chronic pain in her back.  She reports that her left knee replacement has been causing increased discomfort.  She states that she saw Dr. Wynelle Link who recommended wearing a brace on a daily basis and if this does not help she will move forward with physical therapy.  She does not want to have any more surgeries.    Activities of Daily Living:  Patient reports morning stiffness for 3-4 minutes.   Patient Reports nocturnal pain.  Difficulty dressing/grooming: Denies Difficulty climbing stairs: Reports Difficulty getting out of chair: Reports Difficulty using hands for taps, buttons, cutlery, and/or writing: Reports   Review of Systems  Constitutional: Positive for fatigue.    HENT: Negative for mouth sores, mouth dryness and nose dryness.   Eyes: Negative for pain, visual disturbance and dryness.  Respiratory: Negative for cough, hemoptysis, shortness of breath and difficulty breathing.   Cardiovascular: Negative for chest pain, palpitations, hypertension and swelling in legs/feet.  Gastrointestinal: Positive for diarrhea. Negative for abdominal pain, blood in stool and constipation.  Endocrine: Negative for increased urination.  Genitourinary: Negative for painful urination and pelvic pain.  Musculoskeletal: Positive for joint swelling and morning stiffness. Negative for arthralgias, joint pain, myalgias, muscle weakness, muscle tenderness and myalgias.  Skin: Negative for color change, pallor, rash, hair loss, nodules/bumps, skin tightness, ulcers and sensitivity to sunlight.  Allergic/Immunologic: Negative for susceptible to infections.  Neurological: Negative for numbness, headaches and memory loss.  Hematological: Negative for swollen glands.  Psychiatric/Behavioral: Negative for depressed mood, confusion and sleep disturbance. The patient is not nervous/anxious.     PMFS History:  Patient Active Problem List   Diagnosis Date Noted  . Alopecia 04/05/2017  . Migraines 02/01/2017  . History of vertebral fracture 11/14/2016  . 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 History of left shoulder replacement 11/13/2016  . History of total knee replacement, bilateral 11/13/2016  . History of discitis and osteomyelitis vertebrae 09/17/2016  . History of DVT (deep vein thrombosis) and PE 09/17/2016  . History of chronic kidney disease 09/17/2016  . History of myeloproliferative disorder  09/17/2016  . History of IBS 09/17/2016  . Foot pain, left 08/26/2016  . H/O aortic arch replacement 07/03/2016  . Thoracic ascending aortic aneurysm ([place]) 06/29/2016  . Upper respiratory infection 06/05/2016  . Anxiety state 01/02/2016  . Callus of foot 12/02/2015  .  Leukopenia 11/11/2015  . Diskitis 08/29/2015  . Acute osteomyelitis of lumbar spine ([place]) 07/29/2015  . Thoracic compression fracture ([place]) 07/29/2015  . Discitis of lumbosacral region   . Epidural abscess   . Chronic renal insufficiency, stage III (moderate) ([place]) 02/28/2015  . Hammer toe, acquired 11/29/2014  . Actinic keratoses 01/25/2014  . Thrombocytopenia due to drugs 11/24/2013  . Knee pain, acute 07/25/2013  . Atrophic vaginitis 03/23/2013  . S/P ascending aortic replacement 10/04/2012  . S/P AVR (aortic valve replacement) 10/04/2012  . Aortic aneurysm, thoracic ([place]) 09/14/2012  . Aortic insufficiency 09/14/2012  . Hyperglycemia 07/27/2012  . Wound, open, forearm 03/21/2012  . Osteoarthritis of left shoulder 12/07/2011  . Breast lump in female 04/07/2011  . COPD (chronic obstructive pulmonary disease) ([place]) 03/12/2011  . Esophagitis, unspecified 01/09/2011  . Insomnia 11/04/2010  . RESTLESS LEG SYNDROME 02/03/2010  . THRUSH 07/04/2009  . PRURITUS 05/16/2009  . PULMONARY EMBOLISM 09/14/2008  . Constipation 09/14/2008  . Edema 01/09/2008  . Pneumonia, organism unspecified([place]) 07/13/2007  . Chest pain 07/13/2007  . Acute thromboembolism of deep veins of lower extremity ([place]) 07/08/2007  . GERD 07/08/2007  . Irritable bowel syndrome 07/08/2007  . Chronic pain 07/08/2007  . CYSTITIS 05/31/2007  . Depression 05/11/2007  . B12 deficiency 03/10/2007  . Vitamin D deficiency 03/10/2007  . OSTEOARTHRITIS 03/10/2007  . Osteopenia 03/10/2007  . VERTIGO 03/10/2007  . PULMONARY EMBOLISM, HX OF 03/10/2007  . Rheumatoid arthritis ([place]) 11/23/2006    Past Medical History:  Diagnosis Date  . Adrenal insufficiency ([place])   . Anemia   . Anginal pain [facility])    "comes and goes has occured since 2012"  . Blood dyscrasia    "problems with platelets, red cells and white cells being low"  . Bronchitis Jan 2013-March 2013   severe  . Cancer ([place])    skin cancer on face  .  Cataracts, bilateral    more in left than right  . Depression   . Esophagitis   . Fatty liver   . Fibromyalgia   . Fibromyalgia   . Foot pain, left 08/26/2016   2/18 1st MTP - gout vs other  . GERD (gastroesophageal reflux disease)   . Heart murmur   . History of blood clots 2008   below knee post trauma. Dilated [facility] no clotting disorder felt to be present.  . History of echocardiogram    Echo 11/16: Mild LVH, EF 60-65%, normal wall motion, grade 1 diastolic dysfunction, bioprosthetic AVR with mean gradient 21 mmHg and no regurgitation, proximal aortic arch aneurysm 49 mm, moderate TR, PASP 31 mmHg  . IBS (irritable bowel syndrome)   . Leaky heart valve   . Osteoarthritis   . Osteoarthritis of left shoulder 12/07/2011  . Osteopenia   . Pulmonary embolism ([place])   . RA (rheumatoid arthritis) ([place])   . Shortness of breath    attributed to aneurysm  . Sliding hiatal hernia   . Urinary leakage    when coughing  . Vertigo    hx of  . Vitamin B 12 deficiency   . Vitamin D deficiency     Family History  Problem Relation Age of Onset  . Ovarian cancer Mother   .  Diabetes Mother   . COPD Father   . Heart disease Maternal Grandmother   . Clotting disorder Unknown        Father, brother, sister, and daughter all had deep venous thrombosis  . Breast cancer Unknown   . Pancreatic cancer Paternal Uncle   . Colon cancer Neg Hx    Past Surgical History:  Procedure Laterality Date  . ABDOMINAL HYSTERECTOMY    . AORTA SURGERY  12/2012   Resection of aneurysm and valve replacement  . APPENDECTOMY  1956  . BENTALL PROCEDURE N/A 09/29/2012   Procedure: BENTALL PROCEDURE;  Surgeon: Gaye Pollack, MD;  Location: [facility];  Service: Open Heart Surgery;  Laterality: N/A;  WITH CIRC ARREST  . BILATERAL OOPHORECTOMY    . BREAST BIOPSY Left   . CARDIAC CATHETERIZATION  09/15/12   no PCI  . CATARACT EXTRACTION Bilateral   . CHOLECYSTECTOMY  2003  . COLONOSCOPY    . COSMETIC SURGERY      Tummy tuck  . INTRAOPERATIVE TRANSESOPHAGEAL ECHOCARDIOGRAM N/A 09/29/2012   Procedure: INTRAOPERATIVE TRANSESOPHAGEAL ECHOCARDIOGRAM;  Surgeon: Gaye Pollack, MD;  Location: [facility] OR;  Service: Open Heart Surgery;  Laterality: N/A;  . JOINT REPLACEMENT Bilateral   . REPLACEMENT ASCENDING AORTA N/A 06/29/2016   Procedure: AORTIC ARCH REPLACEMENT WITH CIRC ARREST (N/A) - RIGHT AXILLARY CANNULATION  AORTO-AORTIC 28 HEMASHIELD GRAFT WITH AORTO-INNOMINATE AND AORTO-LEFT CAROTID ANASTAMOSIS;  Surgeon: Gaye Pollack, MD;  Location: [facility] OR;  Service: Open Heart Surgery;  Laterality: N/A;  RIGHT AXILLARY CANNULATION  BILATERAL RADIAL A-LINE  . SKIN CANCER EXCISION Right    cheek  . TEE WITHOUT CARDIOVERSION N/A 06/29/2016   Procedure: TRANSESOPHAGEAL ECHOCARDIOGRAM (TEE);  Surgeon: Gaye Pollack, MD;  Location: [facility];  Service: Open Heart Surgery;  Laterality: N/A;  . TONSILLECTOMY  1941  . TOTAL KNEE ARTHROPLASTY     bilateral  . TOTAL SHOULDER ARTHROPLASTY  12/07/2011   Procedure: TOTAL SHOULDER ARTHROPLASTY;  Surgeon: Johnny Bridge, MD;  Location: [facility];  Service: Orthopedics;  Laterality: Left;  left total shoulder artthroplasty  . WRIST SURGERY     bilateral   Social History   Social History Narrative  . Not on file     Objective: Vital Signs: BP 133/77 (BP Location: Left Arm, Patient Position: Sitting, Cuff Size: Normal)   Pulse 74   Resp 12   Ht 5' (1.524 m)   Wt 163 lb (73.9 kg)   BMI 31.83 kg/m    Physical Exam  Constitutional: She is oriented to person, place, and time. She appears well-developed and well-nourished.  HENT:  Head: Normocephalic and atraumatic.  Eyes: Conjunctivae and EOM are normal.  Neck: Normal range of motion.  Cardiovascular: Normal rate, regular rhythm and intact distal pulses.  Murmur heard. Pulmonary/Chest: Effort normal and breath sounds normal.  Abdominal: Soft. Bowel sounds are normal.  Lymphadenopathy:    She has no cervical adenopathy.    Neurological: She is alert and oriented to person, place, and time.  Skin: Skin is warm and dry. Capillary refill takes less than 2 seconds.  Psychiatric: She has a normal mood and affect. Her behavior is normal.  Nursing note and vitals reviewed.    Musculoskeletal Exam: C-spine limited range of motion.  Thoracic kyphosis and scolisis noted.  Limited ROM of lumbar spine.  Shoulder joints and elbow joints good ROM.  Left shoulder replacement is doing well.  Limited ROM of bilateral wrist joints.  PIP and DIP synovial thickening  consistent with osteoarthritis.  She has synovial thickening of MCP joints.  She has mild left wrist tenosynovitis.  She is complete fist formation bilaterally.  Hip joints, knee joints, ankle joints, MTPs, PIPs, DIPs good range of motion with no synovitis.  She has warmth of bilateral knee joints.    CDAI Exam: CDAI Homunculus Exam:   Joint Counts:  CDAI Tender Joint count: 0 CDAI Swollen Joint count: 0  Global Assessments:  Patient Global Assessment: 7 Provider Global Assessment: 7  CDAI Calculated Score: 14    Investigation: No additional findings. CBC Latest Ref Rng & Units 06/29/2017 04/05/2017 02/18/2017  WBC 3.8 - 10.8 Thousand/uL 3.5(L) 3.5(L) 3.6(L)  Hemoglobin 11.7 - 15.5 g/dL 12.6 12.7 11.9  Hematocrit 35.0 - 45.0 % 36.2 38.4 35.4  Platelets 140 - 400 Thousand/uL 121(L) 147.0(L) 132(L)   CMP Latest Ref Rng & Units 06/29/2017 04/05/2017 02/18/2017  Glucose 65 - 99 mg/dL 86 108(H) 90  BUN 7 - 25 mg/dL 29(H) 34(H) 26(H)  Creatinine 0.60 - 0.88 mg/dL 1.22(H) 1.35(H) 1.35(H)  Sodium 135 - 146 mmol/L 140 138 141  Potassium 3.5 - 5.3 mmol/L 4.7 5.1 5.2  Chloride 98 - 110 mmol/L 106 104 105  CO2 20 - 32 mmol/L 28 25 30   Calcium 8.6 - 10.4 mg/dL 9.3 10.0 9.1  Total Protein 6.1 - 8.1 g/dL 7.4 8.1 7.1  Total Bilirubin 0.2 - 1.2 mg/dL 0.5 0.5 0.4  Alkaline Phos 39 - 117 U/L - 85 -  AST 10 - 35 U/L 21 19 16   ALT 6 - 29 U/L 11 9 7     Imaging: No  results found.  Speciality Comments: No specialty comments available.    Procedures:  No procedures performed Allergies: Acetaminophen; Codeine; Pramipexole dihydrochloride; Rofecoxib; Venlafaxine; Arava [leflunomide]; Clonazepam; Diclofenac sodium; Alprazolam; Darvocet [propoxyphene n-acetaminophen]; Duloxetine; Gabapentin; Latex; Morphine and related; Nortriptyline hcl; Penicillins; and Prednisone   Assessment / Plan:     Visit Diagnoses: Rheumatoid arthritis of multiple sites with negative rheumatoid factor (HCC) - severe erosive disease: She has mild tenosynovitis of the left wrist on exam today.  She has synovial thickening of MCP joints and bilateral wrist joints.  She has PIP and DIP synovial thickening consistent with osteoarthritis.  She has been having increased pain in multiple joints due to recently discontinuing oxycodone.  She injects Enbrel every 14 days which seems to be controlling her rheumatoid arthritis fairly well.  We do not want to increase the frequency of her Enbrel subcu days injections due to the risk of recurrent infections.  She will continue on his current treatment regimen.  She is advised to notify us if she develops increased joint pain or joint swelling.  Rheumatoid nodulosis (HCC)  High risk medication use - Enbrel 50 mg subcutaneous every 2 week.  CBC, CMP, and TB gold will be ordered today.  She will return every 3 months for lab work to monitor for drug toxicity.  Standing orders were placed today.  (Simponi Donalda Ewings was held in January 2017 due to discitis,) (inadequate response-Cimzia, Humira, intolerance-MTX& Remicade). - Plan: CBC with Differential/Platelet, COMPLETE METABOLIC PANEL WITH GFR, QuantiFERON-TB Gold Plus  Fibromyalgia: She continues to have generalized muscle aches and muscle tenderness due to fibromyalgia.  She is been off of oxycodone for 5 weeks and has had increased muscle aches and joint pain.  She was encouraged to continue to stay active and  perform light exercises on a regular basis. She has chronic fatigue.   History of left shoulder  replacement: Dr. Mardelle Matte. Good range of motion no discomfort at this time.  History of total knee replacement, bilateral: Warmth of bilateral knee joints.  No effusion noted.  She continues to follow-up with Dr. Wynelle Link.  She has been having increased pain in her left knee replacement.  She has been wearing a brace on the left knee on a daily basis.  If she continues to have significant discomfort she will be starting physical therapy.  History of chronic kidney disease: Creatinine was 1.22 and GFR was 42 on 06/29/2017.  CBC and CMP were ordered today.  History of vitamin D deficiency: She takes vitamin D 2000 units.  History of discitis - osteomyelitis vertebrae - while on Simponi A[place]. Patient had clearence from ID to restart BDMARDS.  She will continue injecting Enbrel subcutaneously every 14 days.  Other osteoporosis without current pathological fracture - Forteo. Her last DEXA in August 2017 showed T score of -4.0.  She takes vitamin D 2000 units.  History of vertebral fracture - Closed compression fracture of thoracic vertebrae:  She has no midline spinal tenderness.  She denies any recent fractures.   Other medical conditions are listed as follows:   Moderate episode of recurrent major depressive disorder (HCC)  History of COPD  History of DVT (deep vein thrombosis) - and PE  S/P AVR (aortic valve replacement)  History of gastroesophageal reflux (GERD)  History of myeloproliferative disorder  H/O aortic arch replacement  History of IBS    Orders: Orders Placed This Encounter  Procedures  . CBC with Differential/Platelet  . COMPLETE METABOLIC PANEL WITH GFR  . QuantiFERON-TB Gold Plus   No orders of the defined types were placed in this encounter.   Face-to-face time spent with patient was 30 minutes. Greater than 50% of time was spent in counseling and coordination of  care.  Follow-Up Instructions: Return in about 5 months (around 04/17/2018) for Rheumatoid arthritis, Fibromyalgia, Osteoporosis.   Ofilia Neas, PA-C  Note - This record has been created using Dragon software.  Chart creation errors have been sought, but may not always  have been located. Such creation errors do not reflect on  the standard of medical care.

## 2017-11-15 ENCOUNTER — Ambulatory Visit: Payer: Medicare HMO | Admitting: Physician Assistant

## 2017-11-15 ENCOUNTER — Encounter: Payer: Self-pay | Admitting: Physician Assistant

## 2017-11-15 VITALS — BP 133/77 | HR 74 | Resp 12 | Ht 60.0 in | Wt 163.0 lb

## 2017-11-15 DIAGNOSIS — Z96612 Presence of left artificial shoulder joint: Secondary | ICD-10-CM

## 2017-11-15 DIAGNOSIS — Z79899 Other long term (current) drug therapy: Secondary | ICD-10-CM

## 2017-11-15 DIAGNOSIS — M0630 Rheumatoid nodule, unspecified site: Secondary | ICD-10-CM | POA: Diagnosis not present

## 2017-11-15 DIAGNOSIS — Z862 Personal history of diseases of the blood and blood-forming organs and certain disorders involving the immune mechanism: Secondary | ICD-10-CM

## 2017-11-15 DIAGNOSIS — Z8781 Personal history of (healed) traumatic fracture: Secondary | ICD-10-CM

## 2017-11-15 DIAGNOSIS — M797 Fibromyalgia: Secondary | ICD-10-CM | POA: Diagnosis not present

## 2017-11-15 DIAGNOSIS — Z8739 Personal history of other diseases of the musculoskeletal system and connective tissue: Secondary | ICD-10-CM | POA: Diagnosis not present

## 2017-11-15 DIAGNOSIS — Z87448 Personal history of other diseases of urinary system: Secondary | ICD-10-CM

## 2017-11-15 DIAGNOSIS — M0609 Rheumatoid arthritis without rheumatoid factor, multiple sites: Secondary | ICD-10-CM

## 2017-11-15 DIAGNOSIS — Z8639 Personal history of other endocrine, nutritional and metabolic disease: Secondary | ICD-10-CM

## 2017-11-15 DIAGNOSIS — F331 Major depressive disorder, recurrent, moderate: Secondary | ICD-10-CM

## 2017-11-15 DIAGNOSIS — Z8709 Personal history of other diseases of the respiratory system: Secondary | ICD-10-CM

## 2017-11-15 DIAGNOSIS — Z96653 Presence of artificial knee joint, bilateral: Secondary | ICD-10-CM | POA: Diagnosis not present

## 2017-11-15 DIAGNOSIS — M818 Other osteoporosis without current pathological fracture: Secondary | ICD-10-CM

## 2017-11-15 DIAGNOSIS — Z86718 Personal history of other venous thrombosis and embolism: Secondary | ICD-10-CM

## 2017-11-15 DIAGNOSIS — Z95828 Presence of other vascular implants and grafts: Secondary | ICD-10-CM

## 2017-11-15 DIAGNOSIS — Z8719 Personal history of other diseases of the digestive system: Secondary | ICD-10-CM

## 2017-11-15 DIAGNOSIS — Z952 Presence of prosthetic heart valve: Secondary | ICD-10-CM

## 2017-11-15 NOTE — Patient Instructions (Signed)
Standing Labs We placed an order today for your standing lab work.   Please come back and get your standing labs in October and every 3 months   We have open lab Monday through Friday from 8:30-11:30 AM and 1:30-4:00 PM  at the office of Dr. Shaili Deveshwar.   You may experience shorter wait times on Monday and Friday afternoons. The office is located at [address], [address], [place], [place] [address] No appointment is necessary.   Labs are drawn by [facility].  You may receive a bill from [facility] for your lab work. If you have any questions regarding directions or hours of operation,  please call [phone].    

## 2017-11-16 ENCOUNTER — Telehealth: Payer: Self-pay | Admitting: Rheumatology

## 2017-11-16 NOTE — Progress Notes (Signed)
WBC is 3.2.  I discussed the lab results with Dr. Estanislado Pandy and she would like the patient to space her dose of Enbrel 50 mg subq injections to every 21 days. We will recheck CBC in 2 months.   Plts are stable  creatinine and GFR are stable.  She should continue avoiding NSAIDs.

## 2017-11-16 NOTE — Telephone Encounter (Signed)
Patient called requesting prescription refill of Enbrel to be sent to [facility].   °

## 2017-11-17 ENCOUNTER — Other Ambulatory Visit: Payer: Self-pay | Admitting: Internal Medicine

## 2017-11-17 LAB — CBC WITH DIFFERENTIAL/PLATELET
BASOS PCT: 1.6 %
Basophils Absolute: 51 cells/uL (ref 0–200)
EOS PCT: 6.3 %
Eosinophils Absolute: 202 cells/uL (ref 15–500)
HEMATOCRIT: 37.6 % (ref 35.0–45.0)
Hemoglobin: 12.5 g/dL (ref 11.7–15.5)
LYMPHS ABS: 1139 {cells}/uL (ref 850–3900)
MCH: 31.3 pg (ref 27.0–33.0)
MCHC: 33.2 g/dL (ref 32.0–36.0)
MCV: 94 fL (ref 80.0–100.0)
MPV: 10 fL (ref 7.5–12.5)
Monocytes Relative: 10.4 %
NEUTROS PCT: 46.1 %
Neutro Abs: 1475 cells/uL — ABNORMAL LOW (ref 1500–7800)
PLATELETS: 126 10*3/uL — AB (ref 140–400)
RBC: 4 10*6/uL (ref 3.80–5.10)
RDW: 13.1 % (ref 11.0–15.0)
Total Lymphocyte: 35.6 %
WBC: 3.2 10*3/uL — AB (ref 3.8–10.8)
WBCMIX: 333 {cells}/uL (ref 200–950)

## 2017-11-17 LAB — QUANTIFERON-TB GOLD PLUS
NIL: 0.12 IU/mL
QuantiFERON-TB Gold Plus: NEGATIVE
TB1-NIL: 0.02 [IU]/mL
TB2-NIL: 0.01 IU/mL

## 2017-11-17 LAB — COMPLETE METABOLIC PANEL WITH GFR
AG Ratio: 1.2 (calc) (ref 1.0–2.5)
ALBUMIN MSPROF: 4.1 g/dL (ref 3.6–5.1)
ALKALINE PHOSPHATASE (APISO): 88 U/L (ref 33–130)
ALT: 12 U/L (ref 6–29)
AST: 24 U/L (ref 10–35)
BILIRUBIN TOTAL: 0.5 mg/dL (ref 0.2–1.2)
BUN / CREAT RATIO: 24 (calc) — AB (ref 6–22)
BUN: 33 mg/dL — AB (ref 7–25)
CO2: 29 mmol/L (ref 20–32)
CREATININE: 1.38 mg/dL — AB (ref 0.60–0.88)
Calcium: 9.4 mg/dL (ref 8.6–10.4)
Chloride: 104 mmol/L (ref 98–110)
GFR, Est African American: 41 mL/min/{1.73_m2} — ABNORMAL LOW (ref >=60)
GFR, Est Non African American: 36 mL/min/{1.73_m2} — ABNORMAL LOW (ref >=60)
GLUCOSE: 127 mg/dL — AB (ref 65–99)
Globulin: 3.4 g/dL (calc) (ref 1.9–3.7)
Potassium: 4.8 mmol/L (ref 3.5–5.3)
Sodium: 139 mmol/L (ref 135–146)
Total Protein: 7.5 g/dL (ref 6.1–8.1)

## 2017-11-17 NOTE — Progress Notes (Signed)
TB gold negative

## 2017-11-18 NOTE — Telephone Encounter (Signed)
Last Visit: 11/15/17 Next Visit: 04/18/18 Labs: 11/15/17 WBC is 3.2.Plts are stable creatinine and GFR are stable. Tb Gold: 11/15/17 Neg   Okay to refill per Dr. Estanislado Pandy  Faxed form to [employer]

## 2017-11-25 ENCOUNTER — Encounter: Payer: Self-pay | Admitting: *Deleted

## 2017-12-14 ENCOUNTER — Ambulatory Visit (INDEPENDENT_AMBULATORY_CARE_PROVIDER_SITE_OTHER): Payer: Medicare HMO | Admitting: Internal Medicine

## 2017-12-14 ENCOUNTER — Encounter: Payer: Self-pay | Admitting: Internal Medicine

## 2017-12-14 ENCOUNTER — Other Ambulatory Visit (INDEPENDENT_AMBULATORY_CARE_PROVIDER_SITE_OTHER): Payer: Medicare HMO

## 2017-12-14 ENCOUNTER — Ambulatory Visit (INDEPENDENT_AMBULATORY_CARE_PROVIDER_SITE_OTHER)
Admission: RE | Admit: 2017-12-14 | Discharge: 2017-12-14 | Disposition: A | Discharge status: Home / Self Care | Payer: Medicare HMO | Source: Ambulatory Visit | Attending: Internal Medicine | Admitting: Internal Medicine

## 2017-12-14 DIAGNOSIS — J439 Emphysema, unspecified: Secondary | ICD-10-CM | POA: Diagnosis not present

## 2017-12-14 DIAGNOSIS — N183 Chronic kidney disease, stage 3 unspecified: Secondary | ICD-10-CM

## 2017-12-14 DIAGNOSIS — R059 Cough, unspecified: Secondary | ICD-10-CM

## 2017-12-14 DIAGNOSIS — R609 Edema, unspecified: Secondary | ICD-10-CM | POA: Diagnosis not present

## 2017-12-14 DIAGNOSIS — R05 Cough: Secondary | ICD-10-CM

## 2017-12-14 LAB — BASIC METABOLIC PANEL
BUN: 36 mg/dL — ABNORMAL HIGH (ref 6–23)
CHLORIDE: 106 meq/L (ref 96–112)
CO2: 26 meq/L (ref 19–32)
CREATININE: 2.09 mg/dL — AB (ref 0.40–1.20)
Calcium: 9.6 mg/dL (ref 8.4–10.5)
GFR: 24.12 mL/min — ABNORMAL LOW (ref >60.00)
Glucose, Bld: 104 mg/dL — ABNORMAL HIGH (ref 70–99)
Potassium: 4.6 mEq/L (ref 3.5–5.1)
Sodium: 139 mEq/L (ref 135–145)

## 2017-12-14 LAB — BRAIN NATRIURETIC PEPTIDE: Pro B Natriuretic peptide (BNP): 251 pg/mL — ABNORMAL HIGH (ref 0.0–100.0)

## 2017-12-14 MED ORDER — TORSEMIDE 20 MG PO TABS: ORAL_TABLET | ORAL | 3 refills | Status: DC

## 2017-12-14 MED ORDER — FLUTICASONE FUROATE-VILANTEROL 100-25 MCG/INH IN AEPB: 1.0000 | INHALATION_SPRAY | Freq: Every day | RESPIRATORY_TRACT | 5 refills | Status: DC

## 2017-12-14 NOTE — Assessment & Plan Note (Signed)
BMET 

## 2017-12-14 NOTE — Assessment & Plan Note (Signed)
Torsemide prn 

## 2017-12-14 NOTE — Assessment & Plan Note (Addendum)
Will use [facility] May need to stop using ASA

## 2017-12-14 NOTE — Progress Notes (Signed)
Subjective:  Patient ID: Diane Abbott, female    DOB: 1934-07-18  Age: 83 y.o. MRN: [id number]  CC: No chief complaint on file.   HPI Diane Abbott presents for LBP - off Oxycontin. On ASA now C/o bronchitis cough x 1 month - dry  Outpatient Medications Prior to Visit  Medication Sig Dispense Refill  . aspirin 325 MG tablet Take 325 mg by mouth daily.    Marland Kitchen azithromycin (ZITHROMAX) 250 MG tablet Take 2 tabs 1 hr prior to dental work prn 12 tablet 0  . Cholecalciferol (VITAMIN D3) 2000 units capsule Take 1 capsule (2,000 Units total) by mouth daily. (Patient taking differently: Take 2,000 Units by mouth as needed. ) 100 capsule 3  . Cyanocobalamin 1000 MCG SUBL Place 1 tablet (1,000 mcg total) under the tongue daily. 100 tablet 11  . etanercept (ENBREL SURECLICK) 50 MG/ML injection Inject 0.98 mLs (50 mg total) into the skin once a week. (Patient taking differently: Inject 50 mg into the skin every 14 (fourteen) days. ) 11.76 mL 0  . meclizine (ANTIVERT) 12.5 MG tablet Take 12.5 mg by mouth 3 (three) times daily as needed for dizziness.    . ranitidine (ZANTAC) 300 MG tablet Take 1 tablet (300 mg total) by mouth at bedtime as needed. 90 tablet 3  . rOPINIRole (REQUIP) 4 MG tablet TAKE 1 TABLET BY MOUTH 3 TIMES A DAY 90 tablet 11  . SUMAtriptan (IMITREX) 50 MG tablet 1 po qd prn migraine 10 tablet 3  . Teriparatide, Recombinant, 600 MCG/2.4ML SOLN Inject 0.08 mLs (20 mcg total) into the skin daily. 2.24 mL 2   No facility-administered medications prior to visit.     ROS: Review of Systems  Constitutional: Negative for activity change, appetite change, chills, fatigue and unexpected weight change.  HENT: Negative for congestion, mouth sores and sinus pressure.   Eyes: Negative for visual disturbance.  Respiratory: Positive for cough. Negative for chest tightness and shortness of breath.   Cardiovascular: Positive for leg swelling.  Gastrointestinal: Negative for abdominal pain and  nausea.  Genitourinary: Negative for difficulty urinating, frequency and vaginal pain.  Musculoskeletal: Positive for arthralgias and back pain. Negative for gait problem.  Skin: Negative for pallor and rash.  Neurological: Negative for dizziness, tremors, weakness, numbness and headaches.  Psychiatric/Behavioral: Negative for confusion and sleep disturbance.    Objective:  BP 134/72 (BP Location: Right Arm, Patient Position: Sitting, Cuff Size: Normal)   Pulse 74   Temp 98.2 F (36.8 C) (Oral)   Ht 5' (1.524 m)   Wt 164 lb (74.4 kg)   SpO2 96%   BMI 32.03 kg/m   BP Readings from Last 3 Encounters:  12/14/17 134/72  11/15/17 133/77  09/22/17 127/71    Wt Readings from Last 3 Encounters:  12/14/17 164 lb (74.4 kg)  11/15/17 163 lb (73.9 kg)  09/22/17 158 lb (71.7 kg)    Physical Exam  Constitutional: She appears well-developed. No distress.  HENT:  Head: Normocephalic.  Right Ear: External ear normal.  Left Ear: External ear normal.  Nose: Nose normal.  Mouth/Throat: Oropharynx is clear and moist.  Eyes: Pupils are equal, round, and reactive to light. Conjunctivae are normal. Right eye exhibits no discharge. Left eye exhibits no discharge.  Neck: Normal range of motion. Neck supple. No JVD present. No tracheal deviation present. No thyromegaly present.  Cardiovascular: Normal rate and regular rhythm.  Murmur heard. Pulmonary/Chest: No stridor. No respiratory distress. She has no wheezes.  Abdominal: Soft. Bowel sounds are normal. She exhibits no distension and no mass. There is no tenderness. There is no rebound and no guarding.  Musculoskeletal: She exhibits tenderness. She exhibits no edema.  Lymphadenopathy:    She has no cervical adenopathy.  Neurological: She displays normal reflexes. No cranial nerve deficit. She exhibits normal muscle tone. Coordination abnormal.  Skin: No rash noted. No erythema.  Psychiatric: She has a normal mood and affect. Her behavior is  normal. Judgment and thought content normal.  LS tender LEs w/trace edema 2/6 murmur  I personally provided Breo inhaler use teaching. After the teaching patient was able to demonstrate it's use effectively. All questions were answered   Lab Results  Component Value Date   WBC 3.2 (L) 11/15/2017   HGB 12.5 11/15/2017   HCT 37.6 11/15/2017   PLT 126 (L) 11/15/2017   GLUCOSE 127 (H) 11/15/2017   ALT 12 11/15/2017   AST 24 11/15/2017   NA 139 11/15/2017   K 4.8 11/15/2017   CL 104 11/15/2017   CREATININE 1.38 (H) 11/15/2017   BUN 33 (H) 11/15/2017   CO2 29 11/15/2017   TSH 2.88 01/09/2011   INR 1.48 06/29/2016   HGBA1C 5.5 06/24/2016    Ct Angio Chest Aorta W &/or Wo Contrast  Result Date: 09/22/2017 CLINICAL DATA:  Status post aortic arch replacement on 06/29/2016 and prior Bentall procedure in 2014. EXAM: CT ANGIOGRAPHY CHEST WITH CONTRAST TECHNIQUE: Multidetector CT imaging of the chest was performed using the standard protocol during bolus administration of intravenous contrast. Multiplanar CT image reconstructions and MIPs were obtained to evaluate the vascular anatomy. CONTRAST:  3mL ISOVUE-370 IOPAMIDOL (ISOVUE-370) INJECTION 76% Creatinine was obtained on site at [facility] at [address] Results: Creatinine 1.4 mg/dL. Estimated GFR 35 mL/minute. Reduce contrast dosing was administered. COMPARISON:  05/27/2016 FINDINGS: Cardiovascular: After replacement of the aortic arch, there is normal patency of the aortic root, ascending thoracic aortic graft and new aortic arch graft with no evidence aneurysmal disease. Proximal and distal anastomoses show normal patency. Anastomoses of the innominate artery and left common carotid artery are normally patent. There is focal mild contrast filled protrusion along the anterior aspect of the arch graft just beyond the takeoff of the left common carotid artery. This measures approximately 5 x 10 mm and can be followed on  subsequent CTA studies. Tortuous left subclavian artery shows normal patency. No evidence of aortic dissection. Native descending thoracic aorta shows stable caliber and tortuosity with maximal caliber of 3.9 cm just beyond the arch. The heart size is stable. No pericardial fluid identified. Stable appearance of prosthetic aortic valve. Central pulmonary arteries are of normal caliber and normally patent. Mediastinum/Nodes: No enlarged mediastinal, hilar, or axillary lymph nodes. Thyroid gland, trachea, and esophagus demonstrate no significant findings. Lungs/Pleura: Scarring at both lung bases, left greater than right. There is no evidence of pulmonary edema, consolidation, pneumothorax, nodule or pleural fluid. Upper Abdomen: No acute abnormality. Musculoskeletal: Stable compression deformity of the L1 vertebral body. Stable milder compression of the T3 vertebral body. Review of the MIP images confirms the above findings. IMPRESSION: 1. Normal patency of replaced aortic arch without residual aneurysmal disease. Implanted innominate and left common carotid artery show normal patency. 2. There is a small focal outpouching of the lumen of the graft anteriorly just beyond the takeoff of the left common carotid artery measuring approximately 5 x 10 mm and communicating with the lumen of the graft. This is presumably postoperative and  can be followed on future imaging studies. Electronically Signed   By: Aletta Edouard M.D.   On: 09/22/2017 17:11    Assessment & Plan:   There are no diagnoses linked to this encounter.   No orders of the defined types were placed in this encounter.    Follow-up: No follow-ups on file.  Walker Kehr, MD

## 2017-12-14 NOTE — Assessment & Plan Note (Signed)
Will use Breo CXR Labs

## 2017-12-21 ENCOUNTER — Telehealth: Payer: Self-pay | Admitting: Rheumatology

## 2017-12-21 NOTE — Telephone Encounter (Signed)
Last Visit: 11/15/17 Next Visit: 04/18/18 Labs: 11/15/17 WBC is 3.2We will recheck CBC in 2 months. Plts are stable creatinine and GFR are stable. TB Gold: 11/15/17 Neg  Okay to refill per Dr. Estanislado Pandy  Will fax to [facility]

## 2017-12-21 NOTE — Telephone Encounter (Signed)
Patient needs refills on Enbrel, and Forteo sent to Mail order pharmacy. (FYI: cannot see where we prescribed Forteo)

## 2017-12-28 ENCOUNTER — Encounter: Payer: Self-pay | Admitting: Internal Medicine

## 2017-12-28 ENCOUNTER — Other Ambulatory Visit (INDEPENDENT_AMBULATORY_CARE_PROVIDER_SITE_OTHER): Payer: Medicare HMO

## 2017-12-28 ENCOUNTER — Ambulatory Visit (INDEPENDENT_AMBULATORY_CARE_PROVIDER_SITE_OTHER): Payer: Medicare HMO | Admitting: Internal Medicine

## 2017-12-28 DIAGNOSIS — R7989 Other specified abnormal findings of blood chemistry: Secondary | ICD-10-CM | POA: Diagnosis not present

## 2017-12-28 DIAGNOSIS — N183 Chronic kidney disease, stage 3 unspecified: Secondary | ICD-10-CM

## 2017-12-28 DIAGNOSIS — R42 Dizziness and giddiness: Secondary | ICD-10-CM

## 2017-12-28 DIAGNOSIS — R05 Cough: Secondary | ICD-10-CM

## 2017-12-28 DIAGNOSIS — R059 Cough, unspecified: Secondary | ICD-10-CM

## 2017-12-28 LAB — BASIC METABOLIC PANEL
BUN: 27 mg/dL — ABNORMAL HIGH (ref 6–23)
CHLORIDE: 100 meq/L (ref 96–112)
CO2: 32 meq/L (ref 19–32)
CREATININE: 1.49 mg/dL — AB (ref 0.40–1.20)
Calcium: 9.7 mg/dL (ref 8.4–10.5)
GFR: 35.64 mL/min — ABNORMAL LOW (ref >60.00)
Glucose, Bld: 93 mg/dL (ref 70–99)
Potassium: 4.3 mEq/L (ref 3.5–5.1)
Sodium: 139 mEq/L (ref 135–145)

## 2017-12-28 LAB — BRAIN NATRIURETIC PEPTIDE: PRO B NATRI PEPTIDE: 190 pg/mL — AB (ref 0.0–100.0)

## 2017-12-28 MED ORDER — RANITIDINE HCL 300 MG PO TABS: 300.0000 mg | ORAL_TABLET | Freq: Every day | ORAL | 3 refills | Status: DC

## 2017-12-28 NOTE — Progress Notes (Signed)
Subjective:  Patient ID: Diane Abbott, female    DOB: 11-20-1934  Age: 83 y.o. MRN: [id number]  CC: No chief complaint on file.   HPI LILIYANA THOBE presents for cough - not much change w/Breo, Demadex. Leg edema is better w/Demadex. CXR was OK. C/o elevated BNP. ECHO 5/91 w/diastolic dysfunction. C/o vertigo  Outpatient Medications Prior to Visit  Medication Sig Dispense Refill  . aspirin 325 MG tablet Take 325 mg by mouth daily.    . Cholecalciferol (VITAMIN D3) 2000 units capsule Take 1 capsule (2,000 Units total) by mouth daily. (Patient taking differently: Take 2,000 Units by mouth as needed. ) 100 capsule 3  . Cyanocobalamin 1000 MCG SUBL Place 1 tablet (1,000 mcg total) under the tongue daily. 100 tablet 11  . etanercept (ENBREL SURECLICK) 50 MG/ML injection Inject 0.98 mLs (50 mg total) into the skin once a week. (Patient taking differently: Inject 50 mg into the skin every 14 (fourteen) days. ) 11.76 mL 0  . fluticasone furoate-vilanterol (BREO ELLIPTA) 100-25 MCG/INH AEPB Inhale 1 puff into the lungs daily. 1 each 5  . meclizine (ANTIVERT) 12.5 MG tablet Take 12.5 mg by mouth 3 (three) times daily as needed for dizziness.    . ranitidine (ZANTAC) 300 MG tablet Take 1 tablet (300 mg total) by mouth at bedtime as needed. 90 tablet 3  . rOPINIRole (REQUIP) 4 MG tablet TAKE 1 TABLET BY MOUTH 3 TIMES A DAY 90 tablet 11  . SUMAtriptan (IMITREX) 50 MG tablet 1 po qd prn migraine 10 tablet 3  . Teriparatide, Recombinant, 600 MCG/2.4ML SOLN Inject 0.08 mLs (20 mcg total) into the skin daily. 2.24 mL 2  . torsemide (DEMADEX) 20 MG tablet 1-2 tabs a day prn swelling 60 tablet 3   No facility-administered medications prior to visit.     ROS: Review of Systems  Constitutional: Positive for fatigue. Negative for activity change, appetite change, chills and unexpected weight change.  HENT: Negative for congestion, mouth sores and sinus pressure.   Eyes: Negative for visual  disturbance.  Respiratory: Positive for cough. Negative for chest tightness and shortness of breath.   Gastrointestinal: Negative for abdominal pain and nausea.  Genitourinary: Negative for difficulty urinating, frequency and vaginal pain.  Musculoskeletal: Negative for back pain and gait problem.  Skin: Negative for pallor and rash.  Neurological: Positive for dizziness. Negative for tremors, weakness, numbness and headaches.  Psychiatric/Behavioral: Negative for confusion, sleep disturbance and suicidal ideas.    Objective:  BP 136/76 (BP Location: Left Arm, Patient Position: Sitting, Cuff Size: Normal)   Pulse 86   Temp 98.4 F (36.9 C) (Oral)   Ht 5' (1.524 m)   Wt 163 lb (73.9 kg)   SpO2 97%   BMI 31.83 kg/m   BP Readings from Last 3 Encounters:  12/28/17 136/76  12/14/17 134/72  11/15/17 133/77    Wt Readings from Last 3 Encounters:  12/28/17 163 lb (73.9 kg)  12/14/17 164 lb (74.4 kg)  11/15/17 163 lb (73.9 kg)    Physical Exam  Constitutional: She appears well-developed. No distress.  HENT:  Head: Normocephalic.  Right Ear: External ear normal.  Left Ear: External ear normal.  Nose: Nose normal.  Mouth/Throat: Oropharynx is clear and moist.  Eyes: Pupils are equal, round, and reactive to light. Conjunctivae are normal. Right eye exhibits no discharge. Left eye exhibits no discharge.  Neck: Normal range of motion. Neck supple. No JVD present. No tracheal deviation present. No thyromegaly present.  Cardiovascular: Regular rhythm.  Murmur heard. Pulmonary/Chest: No stridor. No respiratory distress. She has no wheezes.  Abdominal: Soft. Bowel sounds are normal. She exhibits no distension and no mass. There is no tenderness. There is no rebound and no guarding.  Musculoskeletal: She exhibits no edema or tenderness.  Lymphadenopathy:    She has no cervical adenopathy.  Neurological: She displays normal reflexes. No cranial nerve deficit. She exhibits normal muscle  tone. Coordination normal.  Skin: No rash noted. No erythema.  Psychiatric: She has a normal mood and affect. Her behavior is normal. Judgment and thought content normal.  no edema B Tachycardic Coughing less 2/6 murmur Coughing less I think    Lab Results  Component Value Date   WBC 3.2 (L) 11/15/2017   HGB 12.5 11/15/2017   HCT 37.6 11/15/2017   PLT 126 (L) 11/15/2017   GLUCOSE 104 (H) 12/14/2017   ALT 12 11/15/2017   AST 24 11/15/2017   NA 139 12/14/2017   K 4.6 12/14/2017   CL 106 12/14/2017   CREATININE 2.09 (H) 12/14/2017   BUN 36 (H) 12/14/2017   CO2 26 12/14/2017   TSH 2.88 01/09/2011   INR 1.48 06/29/2016   HGBA1C 5.5 06/24/2016    Dg Chest 2 View  Result Date: 12/14/2017 CLINICAL DATA:  Cough.  Emphysema.  Replacement of ascending aorta. EXAM: CHEST - 2 VIEW COMPARISON:  Chest x-ray dated 07/29/2016 and chest CT dated 09/22/2017 FINDINGS: The heart size and pulmonary vascularity are normal. There is tortuosity and slight prominence of the thoracic aorta. Aortic valve replacement. No infiltrates or effusions. Slight chronic accentuation of the interstitial markings at the lung bases. No acute bone abnormality. Old compression fracture of L1. Proximal left humeral prosthesis. IMPRESSION: No acute abnormalities. Electronically Signed   By: Lorriane Shire M.D.   On: 12/14/2017 09:18    Assessment & Plan:   There are no diagnoses linked to this encounter.   No orders of the defined types were placed in this encounter.    Follow-up: No follow-ups on file.  Walker Kehr, MD

## 2017-12-28 NOTE — Assessment & Plan Note (Signed)
Not sure if BNP is reflecting her fluid balance well. The cough - not much change w/Breo, Demadex. Leg edema is better w/Demadex. CXR was OK.  ECHO 6/96 w/diastolic dysfunction.  Card ref

## 2017-12-28 NOTE — Assessment & Plan Note (Signed)
BMET 

## 2017-12-28 NOTE — Assessment & Plan Note (Signed)
Not sure if BNP is reflecting her fluid balance well. The cough - not much change w/Breo, Demadex. Leg edema is better w/Demadex. CXR was OK.  ECHO 1/79 w/diastolic dysfunction.

## 2017-12-28 NOTE — Assessment & Plan Note (Signed)
Recurrent  

## 2018-01-04 ENCOUNTER — Other Ambulatory Visit: Payer: Self-pay | Admitting: Physician Assistant

## 2018-01-04 NOTE — Telephone Encounter (Signed)
Last Visit: 11/15/17 Next Visit: 04/18/18 Labs: 11/15/17 WBC is 3.2. Plts are stable creatinine and GFR are stable.   Okay to refill per Dr. Estanislado Pandy

## 2018-01-07 ENCOUNTER — Telehealth: Payer: Self-pay | Admitting: Rheumatology

## 2018-01-07 NOTE — Telephone Encounter (Signed)
Patient left a voicemail requesting a return call this morning regarding her prescription refill of Forteo.

## 2018-01-07 NOTE — Telephone Encounter (Signed)
Patient states she has contacted [facility] and they have not received her prescription for Forteo. Patient advised the prescription was faxed to them on 12/21/17. Patient advised would re-fax the prescription. Patient also states her Enbrel prescription was for every 21 days instead of every 14 days. Patient advised that was changed with her last set of abs due to her WBC count trending down.

## 2018-02-09 DIAGNOSIS — M6281 Muscle weakness (generalized): Secondary | ICD-10-CM | POA: Diagnosis not present

## 2018-02-11 DIAGNOSIS — M6281 Muscle weakness (generalized): Secondary | ICD-10-CM | POA: Insufficient documentation

## 2018-02-14 ENCOUNTER — Telehealth: Payer: Self-pay | Admitting: Pharmacy Technician

## 2018-02-14 NOTE — Telephone Encounter (Signed)
Enbrel patient assistance:  Left message for patient advising that [employer] open enrollment has begun for 2020. Patient can print application off online, complete and mail to office; or she can stop by office to complete application.  1:49 PM Beatriz Chancellor, CPhT

## 2018-03-01 ENCOUNTER — Telehealth: Payer: Self-pay

## 2018-03-01 NOTE — Telephone Encounter (Signed)
Diane Abbott, PT with [facility] contacted the office getting clarification on what Diane Abbott can do after being seen by Dr. Cyndia Bent for an aneurysm.  Referred to Dr. Vivi Martens last note when Diane Abbott was seen in 09/2017 that she is to not lift, push, or pull anything over 35 pounds to prevent the risk of dissection.  Voicemail message was given.  Will continue to monitor if follow-up call is warranted.

## 2018-03-02 ENCOUNTER — Telehealth: Payer: Self-pay | Admitting: Pharmacy Technician

## 2018-03-02 NOTE — Telephone Encounter (Signed)
Left message for patient to follow up on [facility] application. Received fax that application was mailed to patient.  Will follow up.  11:51 AM Beatriz Chancellor, CPhT

## 2018-03-04 NOTE — Telephone Encounter (Signed)
Spoke to patient, she will come by office on Monday to complete applications

## 2018-03-07 DIAGNOSIS — M6281 Muscle weakness (generalized): Secondary | ICD-10-CM | POA: Diagnosis not present

## 2018-03-09 ENCOUNTER — Ambulatory Visit: Payer: Medicare HMO | Admitting: Internal Medicine

## 2018-03-10 ENCOUNTER — Other Ambulatory Visit: Payer: Self-pay | Admitting: Internal Medicine

## 2018-03-23 NOTE — Telephone Encounter (Signed)
Patient called in to say she was stung by yellow jackets all over her body and had a fall. She will come to complete applications once she's feeling better.

## 2018-03-28 ENCOUNTER — Ambulatory Visit: Payer: Medicare HMO | Admitting: Internal Medicine

## 2018-04-06 NOTE — Progress Notes (Deleted)
Office Visit Note  Patient: Diane Abbott             Date of Birth: 08-16-1934           MRN: [id number]             PCP: Cassandria Anger, MD Referring: Cassandria Anger, MD Visit Date: 04/20/2018 Occupation: @GUAROCC @  Subjective:  No chief complaint on file.  Enbrel 50 mg every 21 days through [employer].Has a history of recurrent infections. Last TB gold negative on 11/15/17.  Most recent CBC showed low WBC at 3.2 on 11/15/17.  Most recent BMP showed increased serum creatinine on 04/08/18.  Due for CBC/CMP today and then every 3 months. Standing orders are in place. Patient received Prevnar 13, Pneumovax 23, and Zostavax.  Recommend annual flu and Shingrix vaccine as indicated.  X-ray of bilateral hands and feet take on 09/25/16.  She is on Forteo 20 mcg daily (started in November 2018) and vitamin D 2000 units.  Previously treated with bisphosphonate.  DEXA in August 2017 showed T score of -4.0.  Due for repeat DEXA.   History of Present Illness: Diane Abbott is a 83 y.o. female  with history of seronegative rheumatoid arthritis, fibromyalgia, and osteoporosis.   Activities of Daily Living:  Patient reports morning stiffness for *** {minute/hour:19697}.   Patient {ACTIONS;DENIES/REPORTS:21021675::"Denies"} nocturnal pain.  Difficulty dressing/grooming: {ACTIONS;DENIES/REPORTS:21021675::"Denies"} Difficulty climbing stairs: {ACTIONS;DENIES/REPORTS:21021675::"Denies"} Difficulty getting out of chair: {ACTIONS;DENIES/REPORTS:21021675::"Denies"} Difficulty using hands for taps, buttons, cutlery, and/or writing: {ACTIONS;DENIES/REPORTS:21021675::"Denies"}  No Rheumatology ROS completed.   PMFS History:  Patient Active Problem List   Diagnosis Date Noted  . Elevated brain natriuretic peptide (BNP) level 12/28/2017  . Alopecia 04/05/2017  . Migraines 02/01/2017  . History of vertebral fracture 11/14/2016  . 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  History of left shoulder replacement 11/13/2016  . History of total knee replacement, bilateral 11/13/2016  . History of discitis and osteomyelitis vertebrae 09/17/2016  . History of DVT (deep vein thrombosis) and PE 09/17/2016  . History of chronic kidney disease 09/17/2016  . History of myeloproliferative disorder 09/17/2016  . History of IBS 09/17/2016  . Foot pain, left 08/26/2016  . H/O aortic arch replacement 07/03/2016  . Thoracic ascending aortic aneurysm () 06/29/2016  . Upper respiratory infection 06/05/2016  . Anxiety state 01/02/2016  . Callus of foot 12/02/2015  . Leukopenia 11/11/2015  . Diskitis 08/29/2015  . Acute osteomyelitis of lumbar spine ([place]) 07/29/2015  . Thoracic compression fracture ([place]) 07/29/2015  . Discitis of lumbosacral region   . Epidural abscess   . Chronic renal insufficiency, stage III (moderate) ([place]) 02/28/2015  . Hammer toe, acquired 11/29/2014  . Actinic keratoses 01/25/2014  . Thrombocytopenia due to drugs 11/24/2013  . Knee pain, acute 07/25/2013  . Atrophic vaginitis 03/23/2013  . Cough 10/10/2012  . S/P ascending aortic replacement 10/04/2012  . S/P AVR (aortic valve replacement) 10/04/2012  . Aortic aneurysm, thoracic ([place]) 09/14/2012  . Aortic insufficiency 09/14/2012  . Hyperglycemia 07/27/2012  . Wound, open, forearm 03/21/2012  . Osteoarthritis of left shoulder 12/07/2011  . Breast lump in female 04/07/2011  . COPD (chronic obstructive pulmonary disease) ([place]) 03/12/2011  . Esophagitis, unspecified 01/09/2011  . Insomnia 11/04/2010  . RESTLESS LEG SYNDROME 02/03/2010  . THRUSH 07/04/2009  . PRURITUS 05/16/2009  . PULMONARY EMBOLISM 09/14/2008  . Constipation 09/14/2008  . Edema 01/09/2008  . Pneumonia, organism unspecified(486) 07/13/2007  . Chest pain 07/13/2007  . Acute thromboembolism of  deep veins of lower extremity ([place]) 07/08/2007  . GERD 07/08/2007  . Irritable bowel syndrome 07/08/2007  . Chronic pain  07/08/2007  . CYSTITIS 05/31/2007  . Depression 05/11/2007  . B12 deficiency 03/10/2007  . Vitamin D deficiency 03/10/2007  . OSTEOARTHRITIS 03/10/2007  . Osteopenia 03/10/2007  . VERTIGO 03/10/2007  . PULMONARY EMBOLISM, HX OF 03/10/2007  . Rheumatoid arthritis ([place]) 11/23/2006    Past Medical History:  Diagnosis Date  . Adrenal insufficiency ([place])   . Anemia   . Anginal pain [facility])    "comes and goes has occured since 2012"  . Blood dyscrasia    "problems with platelets, red cells and white cells being low"  . Bronchitis Jan 2013-March 2013   severe  . Cancer ([place])    skin cancer on face  . Cataracts, bilateral    more in left than right  . Depression   . Esophagitis   . Fatty liver   . Fibromyalgia   . Fibromyalgia   . Foot pain, left 08/26/2016   2/18 1st MTP - gout vs other  . GERD (gastroesophageal reflux disease)   . Heart murmur   . History of blood clots 2008   below knee post trauma. Dilated [facility] no clotting disorder felt to be present.  . History of echocardiogram    Echo 11/16: Mild LVH, EF 60-65%, normal wall motion, grade 1 diastolic dysfunction, bioprosthetic AVR with mean gradient 21 mmHg and no regurgitation, proximal aortic arch aneurysm 49 mm, moderate TR, PASP 31 mmHg  . IBS (irritable bowel syndrome)   . Leaky heart valve   . Osteoarthritis   . Osteoarthritis of left shoulder 12/07/2011  . Osteopenia   . Pulmonary embolism ([place])   . RA (rheumatoid arthritis) ([place])   . Shortness of breath    attributed to aneurysm  . Sliding hiatal hernia   . Urinary leakage    when coughing  . Vertigo    hx of  . Vitamin B 12 deficiency   . Vitamin D deficiency     Family History  Problem Relation Age of Onset  . Ovarian cancer Mother   . Diabetes Mother   . COPD Father   . Heart disease Maternal Grandmother   . Clotting disorder Unknown        Father, brother, sister, and daughter all had deep venous thrombosis  . Breast cancer Unknown   .  Pancreatic cancer Paternal Uncle   . Colon cancer Neg Hx    Past Surgical History:  Procedure Laterality Date  . ABDOMINAL HYSTERECTOMY    . AORTA SURGERY  12/2012   Resection of aneurysm and valve replacement  . APPENDECTOMY  1956  . BENTALL PROCEDURE N/A 09/29/2012   Procedure: BENTALL PROCEDURE;  Surgeon: Gaye Pollack, MD;  Location: [place];  Service: Open Heart Surgery;  Laterality: N/A;  WITH CIRC ARREST  . BILATERAL OOPHORECTOMY    . BREAST BIOPSY Left   . CARDIAC CATHETERIZATION  09/15/12   no PCI  . CATARACT EXTRACTION Bilateral   . CHOLECYSTECTOMY  2003  . COLONOSCOPY    . COSMETIC SURGERY     Tummy tuck  . INTRAOPERATIVE TRANSESOPHAGEAL ECHOCARDIOGRAM N/A 09/29/2012   Procedure: INTRAOPERATIVE TRANSESOPHAGEAL ECHOCARDIOGRAM;  Surgeon: Gaye Pollack, MD;  Location: [facility] OR;  Service: Open Heart Surgery;  Laterality: N/A;  . JOINT REPLACEMENT Bilateral   . REPLACEMENT ASCENDING AORTA N/A 06/29/2016   Procedure: AORTIC ARCH REPLACEMENT WITH CIRC ARREST (N/A) - RIGHT AXILLARY  CANNULATION  AORTO-AORTIC 28 HEMASHIELD GRAFT WITH AORTO-INNOMINATE AND AORTO-LEFT CAROTID ANASTAMOSIS;  Surgeon: Gaye Pollack, MD;  Location: [place] OR;  Service: Open Heart Surgery;  Laterality: N/A;  RIGHT AXILLARY CANNULATION  BILATERAL RADIAL A-LINE  . SKIN CANCER EXCISION Right    cheek  . TEE WITHOUT CARDIOVERSION N/A 06/29/2016   Procedure: TRANSESOPHAGEAL ECHOCARDIOGRAM (TEE);  Surgeon: Gaye Pollack, MD;  Location: [place];  Service: Open Heart Surgery;  Laterality: N/A;  . TONSILLECTOMY  1941  . TOTAL KNEE ARTHROPLASTY     bilateral  . TOTAL SHOULDER ARTHROPLASTY  12/07/2011   Procedure: TOTAL SHOULDER ARTHROPLASTY;  Surgeon: Johnny Bridge, MD;  Location: [place];  Service: Orthopedics;  Laterality: Left;  left total shoulder artthroplasty  . WRIST SURGERY     bilateral   Social History   Social History Narrative  . Not on file   Immunization History  Administered Date(s) Administered  .  Influenza Whole 03/10/2007, 02/01/2008  . Pneumococcal Conjugate-13 03/23/2013  . Pneumococcal Polysaccharide-23 02/03/2010  . Td 03/21/2012  . Zoster 11/04/2010    Objective: Vital Signs: There were no vitals taken for this visit.   Physical Exam   Musculoskeletal Exam: ***  CDAI Exam: CDAI Score: Not documented Patient Global Assessment: Not documented; Provider Global Assessment: Not documented Swollen: Not documented; Tender: Not documented Joint Exam   Not documented   There is currently no information documented on the homunculus. Go to the Rheumatology activity and complete the homunculus joint exam.  Investigation: No additional findings.  Imaging: No results found.  Recent Labs: Lab Results  Component Value Date   WBC 3.2 (L) 11/15/2017   HGB 12.5 11/15/2017   PLT 126 (L) 11/15/2017   NA 139 12/28/2017   K 4.3 12/28/2017   CL 100 12/28/2017   CO2 32 12/28/2017   GLUCOSE 93 12/28/2017   BUN 27 (H) 12/28/2017   CREATININE 1.49 (H) 12/28/2017   BILITOT 0.5 11/15/2017   ALKPHOS 85 04/05/2017   AST 24 11/15/2017   ALT 12 11/15/2017   PROT 7.5 11/15/2017   ALBUMIN 4.4 04/05/2017   CALCIUM 9.7 12/28/2017   GFRAA 41 (L) 11/15/2017   QFTBGOLD TNP 03/12/2015   QFTBGOLDPLUS NEGATIVE 11/15/2017    Speciality Comments: No specialty comments available.  Procedures:  No procedures performed Allergies: Acetaminophen; Codeine; Pramipexole dihydrochloride; Rofecoxib; Venlafaxine; Arava [leflunomide]; Clonazepam; Diclofenac sodium; Alprazolam; Darvocet [propoxyphene n-acetaminophen]; Duloxetine; Gabapentin; Latex; Morphine and related; Nortriptyline hcl; Penicillins; and Prednisone   Assessment / Plan:     Visit Diagnoses: No diagnosis found.   Orders: No orders of the defined types were placed in this encounter.  No orders of the defined types were placed in this encounter.   Face-to-face time spent with patient was *** minutes. Greater than 50% of time  was spent in counseling and coordination of care.  Follow-Up Instructions: No follow-ups on file.   Earnestine Mealing, CMA  Note - This record has been created using Editor, commissioning.  Chart creation errors have been sought, but may not always  have been located. Such creation errors do not reflect on  the standard of medical care.

## 2018-04-08 ENCOUNTER — Encounter: Payer: Self-pay | Admitting: Internal Medicine

## 2018-04-08 ENCOUNTER — Ambulatory Visit (INDEPENDENT_AMBULATORY_CARE_PROVIDER_SITE_OTHER): Payer: Medicare HMO | Admitting: Internal Medicine

## 2018-04-08 ENCOUNTER — Other Ambulatory Visit (INDEPENDENT_AMBULATORY_CARE_PROVIDER_SITE_OTHER): Payer: Medicare HMO

## 2018-04-08 DIAGNOSIS — N183 Chronic kidney disease, stage 3 unspecified: Secondary | ICD-10-CM

## 2018-04-08 DIAGNOSIS — G894 Chronic pain syndrome: Secondary | ICD-10-CM

## 2018-04-08 DIAGNOSIS — T63461S Toxic effect of venom of wasps, accidental (unintentional), sequela: Secondary | ICD-10-CM

## 2018-04-08 DIAGNOSIS — I82402 Acute embolism and thrombosis of unspecified deep veins of left lower extremity: Secondary | ICD-10-CM

## 2018-04-08 DIAGNOSIS — I351 Nonrheumatic aortic (valve) insufficiency: Secondary | ICD-10-CM | POA: Diagnosis not present

## 2018-04-08 DIAGNOSIS — Z86718 Personal history of other venous thrombosis and embolism: Secondary | ICD-10-CM | POA: Diagnosis not present

## 2018-04-08 DIAGNOSIS — E538 Deficiency of other specified B group vitamins: Secondary | ICD-10-CM | POA: Diagnosis not present

## 2018-04-08 DIAGNOSIS — R609 Edema, unspecified: Secondary | ICD-10-CM

## 2018-04-08 DIAGNOSIS — T63461A Toxic effect of venom of wasps, accidental (unintentional), initial encounter: Secondary | ICD-10-CM | POA: Insufficient documentation

## 2018-04-08 LAB — BASIC METABOLIC PANEL
BUN: 31 mg/dL — ABNORMAL HIGH (ref 6–23)
CO2: 29 mEq/L (ref 19–32)
Calcium: 9.4 mg/dL (ref 8.4–10.5)
Chloride: 105 mEq/L (ref 96–112)
Creatinine, Ser: 1.29 mg/dL — ABNORMAL HIGH (ref 0.40–1.20)
GFR: 42.07 mL/min — ABNORMAL LOW (ref >60.00)
Glucose, Bld: 101 mg/dL — ABNORMAL HIGH (ref 70–99)
POTASSIUM: 4.3 meq/L (ref 3.5–5.1)
SODIUM: 142 meq/L (ref 135–145)

## 2018-04-08 LAB — TSH: TSH: 3.95 u[IU]/mL (ref 0.35–4.50)

## 2018-04-08 MED ORDER — METHYLPREDNISOLONE 4 MG PO TBPK: ORAL_TABLET | ORAL | 0 refills | Status: DC

## 2018-04-08 NOTE — Assessment & Plan Note (Signed)
D dimer - r/o DVT

## 2018-04-08 NOTE — Assessment & Plan Note (Signed)
Card f/u

## 2018-04-08 NOTE — Assessment & Plan Note (Signed)
Off pain meds now

## 2018-04-08 NOTE — Assessment & Plan Note (Signed)
Medrol, Benadryl, sudafed prn

## 2018-04-08 NOTE — Progress Notes (Signed)
Subjective:  Patient ID: Diane Abbott, female    DOB: [birth year]  Age: 83 y.o. MRN: [id number]  CC: No chief complaint on file.   HPI Diane Abbott presents for chronic pain, RLS, HAs C/o a new L leg swelling x2 mo C/o yellow jackets stings 2 mo ago - local swelling  Outpatient Medications Prior to Visit  Medication Sig Dispense Refill  . aspirin 325 MG tablet Take 325 mg by mouth daily.    . Cholecalciferol (VITAMIN D3) 2000 units capsule Take 1 capsule (2,000 Units total) by mouth daily. (Patient taking differently: Take 2,000 Units by mouth as needed. ) 100 capsule 3  . Cyanocobalamin 1000 MCG SUBL Place 1 tablet (1,000 mcg total) under the tongue daily. 100 tablet 11  . etanercept (ENBREL SURECLICK) 50 MG/ML injection Inject 0.98 mLs (50 mg total) into the skin once a week. (Patient taking differently: Inject 50 mg into the skin every 14 (fourteen) days. ) 11.76 mL 0  . fluticasone furoate-vilanterol (BREO ELLIPTA) 100-25 MCG/INH AEPB Inhale 1 puff into the lungs daily. 1 each 5  . FORTEO 600 MCG/2.4ML SOLN INJECT 0.08ML (20MCG) UNDER THE SKIN ONCE DAILY 7.2 mL 0  . meclizine (ANTIVERT) 12.5 MG tablet Take 12.5 mg by mouth 3 (three) times daily as needed for dizziness.    . ranitidine (ZANTAC) 300 MG tablet Take 1 tablet (300 mg total) by mouth daily. 90 tablet 3  . rOPINIRole (REQUIP) 4 MG tablet TAKE 1 TABLET BY MOUTH 3 TIMES A DAY 90 tablet 11  . SUMAtriptan (IMITREX) 50 MG tablet 1 po qd prn migraine 10 tablet 3  . torsemide (DEMADEX) 20 MG tablet TAKE 1-2 TABLETS BY MOUTH DAILY AS NEEDED FOR SWELLING 180 tablet 1   No facility-administered medications prior to visit.     ROS: Review of Systems  Constitutional: Positive for fatigue. Negative for activity change, appetite change, chills and unexpected weight change.  HENT: Negative for congestion, mouth sores and sinus pressure.   Eyes: Negative for visual disturbance.  Respiratory: Negative for cough and chest  tightness.   Cardiovascular: Positive for leg swelling.  Gastrointestinal: Negative for abdominal pain and nausea.  Genitourinary: Negative for difficulty urinating, frequency and vaginal pain.  Musculoskeletal: Positive for arthralgias, back pain and gait problem.  Skin: Negative for pallor and rash.  Neurological: Negative for dizziness, tremors, weakness, numbness and headaches.  Psychiatric/Behavioral: Negative for confusion, sleep disturbance and suicidal ideas. The patient is nervous/anxious.     Objective:  There were no vitals taken for this visit.  BP Readings from Last 3 Encounters:  12/28/17 136/76  12/14/17 134/72  11/15/17 133/77    Wt Readings from Last 3 Encounters:  12/28/17 163 lb (73.9 kg)  12/14/17 164 lb (74.4 kg)  11/15/17 163 lb (73.9 kg)    Physical Exam  Constitutional: She appears well-developed. No distress.  HENT:  Head: Normocephalic.  Right Ear: External ear normal.  Left Ear: External ear normal.  Nose: Nose normal.  Mouth/Throat: Oropharynx is clear and moist.  Eyes: Pupils are equal, round, and reactive to light. Conjunctivae are normal. Right eye exhibits no discharge. Left eye exhibits no discharge.  Neck: Normal range of motion. Neck supple. No JVD present. No tracheal deviation present. No thyromegaly present.  Cardiovascular: Normal rate and regular rhythm.  Murmur heard. Pulmonary/Chest: No stridor. No respiratory distress. She has no wheezes.  Abdominal: Soft. Bowel sounds are normal. She exhibits no distension and no mass. There is no tenderness.  There is no rebound and no guarding.  Musculoskeletal: She exhibits edema and tenderness.  Lymphadenopathy:    She has no cervical adenopathy.  Neurological: She displays normal reflexes. No cranial nerve deficit. She exhibits normal muscle tone. Coordination abnormal.  Skin: No rash noted. No erythema.  Psychiatric: She has a normal mood and affect. Her behavior is normal. Judgment and  thought content normal.    LLE w/edema 1+, RLE - trace Cane LS tender  Lab Results  Component Value Date   WBC 3.2 (L) 11/15/2017   HGB 12.5 11/15/2017   HCT 37.6 11/15/2017   PLT 126 (L) 11/15/2017   GLUCOSE 93 12/28/2017   ALT 12 11/15/2017   AST 24 11/15/2017   NA 139 12/28/2017   K 4.3 12/28/2017   CL 100 12/28/2017   CREATININE 1.49 (H) 12/28/2017   BUN 27 (H) 12/28/2017   CO2 32 12/28/2017   TSH 2.88 01/09/2011   INR 1.48 06/29/2016   HGBA1C 5.5 06/24/2016    Dg Chest 2 View  Result Date: 12/14/2017 CLINICAL DATA:  Cough.  Emphysema.  Replacement of ascending aorta. EXAM: CHEST - 2 VIEW COMPARISON:  Chest x-ray dated 07/29/2016 and chest CT dated 09/22/2017 FINDINGS: The heart size and pulmonary vascularity are normal. There is tortuosity and slight prominence of the thoracic aorta. Aortic valve replacement. No infiltrates or effusions. Slight chronic accentuation of the interstitial markings at the lung bases. No acute bone abnormality. Old compression fracture of L1. Proximal left humeral prosthesis. IMPRESSION: No acute abnormalities. Electronically Signed   By: Lorriane Shire M.D.   On: 12/14/2017 09:18    Assessment & Plan:   There are no diagnoses linked to this encounter.   No orders of the defined types were placed in this encounter.    Follow-up: No follow-ups on file.  Walker Kehr, MD

## 2018-04-08 NOTE — Assessment & Plan Note (Signed)
On B12 

## 2018-04-08 NOTE — Assessment & Plan Note (Signed)
BMET On Demadex

## 2018-04-08 NOTE — Assessment & Plan Note (Signed)
R/o DVT

## 2018-04-09 LAB — D-DIMER, QUANTITATIVE: D-Dimer, Quant: 5.13 mcg/mL FEU — ABNORMAL HIGH (ref <0.50)

## 2018-04-10 ENCOUNTER — Other Ambulatory Visit: Payer: Self-pay | Admitting: Internal Medicine

## 2018-04-10 DIAGNOSIS — R609 Edema, unspecified: Secondary | ICD-10-CM

## 2018-04-11 ENCOUNTER — Telehealth (HOSPITAL_COMMUNITY): Payer: Self-pay | Admitting: *Deleted

## 2018-04-11 NOTE — Telephone Encounter (Addendum)
04/11/2018 left msg asking pt to call.    04/12/2018 left msg asking pt to call

## 2018-04-11 NOTE — Telephone Encounter (Signed)
Attempted to reach patient for the second time. No answer. Left message.

## 2018-04-12 ENCOUNTER — Telehealth: Payer: Self-pay | Admitting: Internal Medicine

## 2018-04-12 NOTE — Telephone Encounter (Signed)
LVM for both patient and daughter to call and schedule appt

## 2018-04-12 NOTE — Telephone Encounter (Signed)
Copied from [place] [id number]. Topic: Quick Communication - See Telephone Encounter >> Apr 12, 2018  4:32 PM Bea Graff, NT wrote: CRM for notification. See Telephone encounter for: 04/12/18. Juliann Pulse with [facility] on [address] states they have tried to contact this pt and her daughter several times and unable to get in touch with them to schedule pt for her Vascular US lower extremity. CB#: [phone]

## 2018-04-18 ENCOUNTER — Encounter: Payer: Self-pay | Admitting: Internal Medicine

## 2018-04-18 ENCOUNTER — Ambulatory Visit: Payer: Medicare HMO | Admitting: Physician Assistant

## 2018-04-18 NOTE — Telephone Encounter (Signed)
Mailed pt letter to contact [facility] on [address]

## 2018-04-20 ENCOUNTER — Ambulatory Visit: Payer: Medicare HMO | Admitting: Rheumatology

## 2018-04-22 NOTE — Progress Notes (Signed)
Office Visit Note  Patient: Diane Abbott             Date of Birth: [birth year]           MRN: [id number]             PCP: Cassandria Anger, MD Referring: Cassandria Anger, MD Visit Date: 04/29/2018 Occupation: @GUAROCC @  Subjective:  Left knee pain   History of Present Illness: Diane Abbott is a 83 y.o. female with history of seronegative rheumatoid arthritis and fibromyalgia.  She has been injecting Enbrel 50 mg sq once every 3 weeks.  She denies any recent rheumatoid arthritis flares.  She is discontinued Forteo about 3 weeks ago due to "getting out of the routine of doing the injections." She plans on restarting.  She reports she has chronic pain in the left knee that is replaced.  She states when she walks the left knee buckles.  She reports the left knee stays warm and swells.  She walks with a cane.  She has been to physical therapy in the past, and she plans on going back. She states she wears a brace at times.  She continues to follow up with Dr. Wynelle Link.  She denies any other joint pain or joint swelling at this time.    Activities of Daily Living:  Patient reports morning stiffness for 1 minute.   Patient Reports nocturnal pain.  Difficulty dressing/grooming: Denies Difficulty climbing stairs: Reports Difficulty getting out of chair: Reports Difficulty using hands for taps, buttons, cutlery, and/or writing: Reports  Review of Systems  Constitutional: Positive for fatigue.  HENT: Negative for mouth sores, mouth dryness and nose dryness.   Eyes: Positive for redness. Negative for pain, visual disturbance and dryness.  Respiratory: Negative for cough, hemoptysis, shortness of breath and difficulty breathing.   Cardiovascular: Negative for chest pain, palpitations, hypertension and swelling in legs/feet.  Gastrointestinal: Positive for constipation (Hx of IBS) and diarrhea. Negative for blood in stool.  Endocrine: Negative for increased urination.  Genitourinary:  Negative for painful urination.  Musculoskeletal: Positive for arthralgias, joint pain and morning stiffness. Negative for joint swelling, myalgias, muscle weakness, muscle tenderness and myalgias.  Skin: Negative for color change, pallor, rash, hair loss, nodules/bumps, skin tightness, ulcers and sensitivity to sunlight.  Allergic/Immunologic: Negative for susceptible to infections.  Neurological: Negative for dizziness, numbness, headaches and weakness.  Hematological: Negative for swollen glands.  Psychiatric/Behavioral: Positive for depressed mood and sleep disturbance. The patient is not nervous/anxious.     PMFS History:  Patient Active Problem List   Diagnosis Date Noted  . Yellow jacket sting 04/08/2018  . Elevated brain natriuretic peptide (BNP) level 12/28/2017  . Alopecia 04/05/2017  . Migraines 02/01/2017  . History of vertebral fracture 11/14/2016  . 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 History of left shoulder replacement 11/13/2016  . History of total knee replacement, bilateral 11/13/2016  . History of discitis and osteomyelitis vertebrae 09/17/2016  . History of DVT (deep vein thrombosis) and PE 09/17/2016  . History of chronic kidney disease 09/17/2016  . History of myeloproliferative disorder 09/17/2016  . History of IBS 09/17/2016  . Foot pain, left 08/26/2016  . H/O aortic arch replacement 07/03/2016  . Thoracic ascending aortic aneurysm ([place]) 06/29/2016  . Upper respiratory infection 06/05/2016  . Anxiety state 01/02/2016  . Callus of foot 12/02/2015  . Leukopenia 11/11/2015  . Diskitis 08/29/2015  . Acute osteomyelitis of lumbar spine ([place]) 07/29/2015  . Thoracic compression fracture ([place])  07/29/2015  . Discitis of lumbosacral region   . Epidural abscess   . Chronic renal insufficiency, stage III (moderate) ([place]) 02/28/2015  . Hammer toe, acquired 11/29/2014  . Actinic keratoses 01/25/2014  . Thrombocytopenia due to drugs 11/24/2013  . Knee pain,  acute 07/25/2013  . Atrophic vaginitis 03/23/2013  . Cough 10/10/2012  . S/P ascending aortic replacement 10/04/2012  . S/P AVR (aortic valve replacement) 10/04/2012  . Aortic aneurysm, thoracic ([place]) 09/14/2012  . Aortic insufficiency 09/14/2012  . Hyperglycemia 07/27/2012  . Wound, open, forearm 03/21/2012  . Osteoarthritis of left shoulder 12/07/2011  . Breast lump in female 04/07/2011  . COPD (chronic obstructive pulmonary disease) ([place]) 03/12/2011  . Esophagitis, unspecified 01/09/2011  . Insomnia 11/04/2010  . RESTLESS LEG SYNDROME 02/03/2010  . THRUSH 07/04/2009  . PRURITUS 05/16/2009  . PULMONARY EMBOLISM 09/14/2008  . Constipation 09/14/2008  . Edema 01/09/2008  . Pneumonia, organism unspecified(486) 07/13/2007  . Chest pain 07/13/2007  . History of DVT in adulthood 07/08/2007  . GERD 07/08/2007  . Irritable bowel syndrome 07/08/2007  . Chronic pain 07/08/2007  . CYSTITIS 05/31/2007  . Depression 05/11/2007  . B12 deficiency 03/10/2007  . Vitamin D deficiency 03/10/2007  . OSTEOARTHRITIS 03/10/2007  . Osteopenia 03/10/2007  . VERTIGO 03/10/2007  . PULMONARY EMBOLISM, HX OF 03/10/2007  . Rheumatoid arthritis ([place]) 11/23/2006    Past Medical History:  Diagnosis Date  . Adrenal insufficiency ([place])   . Anemia   . Anginal pain [facility])    "comes and goes has occured since 2012"  . Blood dyscrasia    "problems with platelets, red cells and white cells being low"  . Bronchitis Jan 2013-March 2013   severe  . Cancer ([place])    skin cancer on face  . Cataracts, bilateral    more in left than right  . Depression   . Esophagitis   . Fatty liver   . Fibromyalgia   . Fibromyalgia   . Foot pain, left 08/26/2016   2/18 1st MTP - gout vs other  . GERD (gastroesophageal reflux disease)   . Heart murmur   . History of blood clots 2008   below knee post trauma. Dilated [facility] no clotting disorder felt to be present.  . History of echocardiogram    Echo 11/16:  Mild LVH, EF 60-65%, normal wall motion, grade 1 diastolic dysfunction, bioprosthetic AVR with mean gradient 21 mmHg and no regurgitation, proximal aortic arch aneurysm 49 mm, moderate TR, PASP 31 mmHg  . IBS (irritable bowel syndrome)   . Leaky heart valve   . Osteoarthritis   . Osteoarthritis of left shoulder 12/07/2011  . Osteopenia   . Pulmonary embolism ([place])   . RA (rheumatoid arthritis) ([place])   . Shortness of breath    attributed to aneurysm  . Sliding hiatal hernia   . Urinary leakage    when coughing  . Vertigo    hx of  . Vitamin B 12 deficiency   . Vitamin D deficiency     Family History  Problem Relation Age of Onset  . Ovarian cancer Mother   . Diabetes Mother   . COPD Father   . Heart disease Maternal Grandmother   . Clotting disorder Other        Father, brother, sister, and daughter all had deep venous thrombosis  . Breast cancer Other   . Pancreatic cancer Paternal Uncle   . Colon cancer Neg Hx    Past Surgical History:  Procedure Laterality  Date  . ABDOMINAL HYSTERECTOMY    . AORTA SURGERY  12/2012   Resection of aneurysm and valve replacement  . APPENDECTOMY  1956  . BENTALL PROCEDURE N/A 09/29/2012   Procedure: BENTALL PROCEDURE;  Surgeon: Gaye Pollack, MD;  Location: [place];  Service: Open Heart Surgery;  Laterality: N/A;  WITH CIRC ARREST  . BILATERAL OOPHORECTOMY    . BREAST BIOPSY Left   . CARDIAC CATHETERIZATION  09/15/12   no PCI  . CATARACT EXTRACTION Bilateral   . CHOLECYSTECTOMY  2003  . COLONOSCOPY    . COSMETIC SURGERY     Tummy tuck  . INTRAOPERATIVE TRANSESOPHAGEAL ECHOCARDIOGRAM N/A 09/29/2012   Procedure: INTRAOPERATIVE TRANSESOPHAGEAL ECHOCARDIOGRAM;  Surgeon: Gaye Pollack, MD;  [facility];  Service: Open Heart Surgery;  Laterality: N/A;  . JOINT REPLACEMENT Bilateral   . REPLACEMENT ASCENDING AORTA N/A 06/29/2016   Procedure: AORTIC ARCH REPLACEMENT WITH CIRC ARREST (N/A) - RIGHT AXILLARY CANNULATION  AORTO-AORTIC 28 HEMASHIELD  GRAFT WITH AORTO-INNOMINATE AND AORTO-LEFT CAROTID ANASTAMOSIS;  Surgeon: Gaye Pollack, MD;  Location: [facility] OR;  Service: Open Heart Surgery;  Laterality: N/A;  RIGHT AXILLARY CANNULATION  BILATERAL RADIAL A-LINE  . SKIN CANCER EXCISION Right    cheek  . TEE WITHOUT CARDIOVERSION N/A 06/29/2016   Procedure: TRANSESOPHAGEAL ECHOCARDIOGRAM (TEE);  Surgeon: Gaye Pollack, MD;  Location: [place];  Service: Open Heart Surgery;  Laterality: N/A;  . TONSILLECTOMY  1941  . TOTAL KNEE ARTHROPLASTY     bilateral  . TOTAL SHOULDER ARTHROPLASTY  12/07/2011   Procedure: TOTAL SHOULDER ARTHROPLASTY;  Surgeon: Johnny Bridge, MD;  Location: [facility];  Service: Orthopedics;  Laterality: Left;  left total shoulder artthroplasty  . WRIST SURGERY     bilateral   Social History   Social History Narrative  . Not on file    Objective: Vital Signs: BP 122/70 (BP Location: Left Arm, Patient Position: Sitting, Cuff Size: Normal)   Pulse 75   Resp 15   Ht 5' (1.524 m)   Wt 168 lb 12.8 oz (76.6 kg)   BMI 32.97 kg/m    Physical Exam Vitals signs and nursing note reviewed.  Constitutional:      Appearance: She is well-developed.  HENT:     Head: Normocephalic and atraumatic.  Eyes:     Conjunctiva/sclera: Conjunctivae normal.  Neck:     Musculoskeletal: Normal range of motion.  Cardiovascular:     Rate and Rhythm: Normal rate and regular rhythm.     Heart sounds: Normal heart sounds.  Pulmonary:     Effort: Pulmonary effort is normal.     Breath sounds: Normal breath sounds.  Abdominal:     General: Bowel sounds are normal.     Palpations: Abdomen is soft.  Lymphadenopathy:     Cervical: No cervical adenopathy.  Skin:    General: Skin is warm and dry.     Capillary Refill: Capillary refill takes less than 2 seconds.  Neurological:     Mental Status: She is alert and oriented to person, place, and time.  Psychiatric:        Behavior: Behavior normal.      Musculoskeletal Exam: C-spine  limited ROM.  Thoracic kyphosis.  Limited ROM of lumbar spine.  No midline spinal tenderness.  No SI joint tenderness. Left shoulder replacement has good ROM with no discomfort.  Right shoulder full ROM with no discomfort.  Elbow joints good ROM with no tenderness or swelling.  Wrist joints  good ROM.  She has synovial thickening of all MCPs.  Ulnar deviation noted.   Replaced knee joints have good ROM.  Left knee warmth noted.  No tenderness of ankle joints.  Pedal edema bilaterally.   CDAI Exam: CDAI Score: 3  Patient Global Assessment: 5 (mm); Provider Global Assessment: 5 (mm) Swollen: 1 ; Tender: 1  Joint Exam      Right  Left  PIP 2  Swollen Tender        Investigation: No additional findings.  Imaging: No results found.  Recent Labs: Lab Results  Component Value Date   WBC 3.2 (L) 11/15/2017   HGB 12.5 11/15/2017   PLT 126 (L) 11/15/2017   NA 142 04/08/2018   K 4.3 04/08/2018   CL 105 04/08/2018   CO2 29 04/08/2018   GLUCOSE 101 (H) 04/08/2018   BUN 31 (H) 04/08/2018   CREATININE 1.29 (H) 04/08/2018   BILITOT 0.5 11/15/2017   ALKPHOS 85 04/05/2017   AST 24 11/15/2017   ALT 12 11/15/2017   PROT 7.5 11/15/2017   ALBUMIN 4.4 04/05/2017   CALCIUM 9.4 04/08/2018   GFRAA 41 (L) 11/15/2017   QFTBGOLD TNP 03/12/2015   QFTBGOLDPLUS NEGATIVE 11/15/2017    Speciality Comments: [place] therapy included: Simponi, Arava, Cimzia and Humira (inadequate response), methotrexate and Remicade (intolerance)  Procedures:  No procedures performed Allergies: Acetaminophen; Codeine; Pramipexole dihydrochloride; Rofecoxib; Venlafaxine; Arava [leflunomide]; Clonazepam; Diclofenac sodium; Alprazolam; Darvocet [propoxyphene n-acetaminophen]; Duloxetine; Gabapentin; Latex; Morphine and related; Nortriptyline hcl; Penicillins; and Prednisone   Assessment / Plan:     Visit Diagnoses: Rheumatoid arthritis of multiple sites with negative rheumatoid factor (HCC) - Severe erosive disease: She has  tenderness and synovitis of the right second PIP joint.  She has been injecting Enbrel 50 mg subcutaneously every 3 weeks.  Overall she is clinically doing well on Enbrel.  She has not had any flares recently.  She has chronic to left knee pain and continues to see Dr. Wynelle Link.  She is planning on going back to physical therapy for lower extremity muscle strengthening.  She is encouraged to wear the brace that Dr. Wynelle Link provided.  She will continue on this current treatment regimen.  She was advised to notify us that she will have increased joint pain or joint swelling.  She will follow-up in the office in 5 months.  She had x-rays of bilateral hands and feet on 09/25/2016.  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   High risk medication use - - Enbrel 50 mg subcutaneous every 3 weeks. CBC was drawn today.  BMP was drawn on 04/08/18.  TB gold negative 11/15/17.   Fibromyalgia: She continues to have generalized muscle aches and muscle tenderness.    History of left shoulder replacement - Dr. Kathrynn Ducking has good ROM with no discomfort.   History of total knee replacement, bilateral she has chronic pain in the left knee that is replaced.  She continues to follow-up with Dr. Wynelle Link.  She has warmth on exam today.  Her left knee buckles when she walks.  She was advised to start wearing the brace that Dr. Wynelle Link provided.  She plans on going back to physical therapy for lower extremity muscle strengthening and fall prevention.  Other osteoporosis without current pathological fracture: She is prescribed Forteo daily injections. She has been off for [facility] for 2-3 weeks.  She was advised to restart.    History of vertebral fracture: She has no midline spinal tenderness at this time.  She has thoracic kyphosis.   Other medical conditions are listed as follows:   History of chronic kidney disease  History of vitamin D deficiency  History of discitis  Moderate episode of recurrent major  depressive disorder (HCC)  History of COPD  History of DVT (deep vein thrombosis)  S/P AVR (aortic valve replacement)   Orders: Orders Placed This Encounter  Procedures  . CBC with Differential/Platelet   No orders of the defined types were placed in this encounter.    Follow-Up Instructions: Return in about 5 months (around 09/28/2018) for Rheumatoid arthritis, Fibromyalgia.   Ofilia Neas, PA-C  Note - This record has been created using Dragon software.  Chart creation errors have been sought, but may not always  have been located. Such creation errors do not reflect on  the standard of medical care.

## 2018-04-29 ENCOUNTER — Ambulatory Visit (INDEPENDENT_AMBULATORY_CARE_PROVIDER_SITE_OTHER): Payer: Medicare HMO | Admitting: Physician Assistant

## 2018-04-29 ENCOUNTER — Encounter: Payer: Self-pay | Admitting: Physician Assistant

## 2018-04-29 VITALS — BP 122/70 | HR 75 | Resp 15 | Ht 60.0 in | Wt 168.8 lb

## 2018-04-29 DIAGNOSIS — M797 Fibromyalgia: Secondary | ICD-10-CM

## 2018-04-29 DIAGNOSIS — Z8639 Personal history of other endocrine, nutritional and metabolic disease: Secondary | ICD-10-CM

## 2018-04-29 DIAGNOSIS — Z79899 Other long term (current) drug therapy: Secondary | ICD-10-CM | POA: Diagnosis not present

## 2018-04-29 DIAGNOSIS — Z8781 Personal history of (healed) traumatic fracture: Secondary | ICD-10-CM

## 2018-04-29 DIAGNOSIS — Z96612 Presence of left artificial shoulder joint: Secondary | ICD-10-CM

## 2018-04-29 DIAGNOSIS — M0630 Rheumatoid nodule, unspecified site: Secondary | ICD-10-CM

## 2018-04-29 DIAGNOSIS — Z86718 Personal history of other venous thrombosis and embolism: Secondary | ICD-10-CM

## 2018-04-29 DIAGNOSIS — F331 Major depressive disorder, recurrent, moderate: Secondary | ICD-10-CM

## 2018-04-29 DIAGNOSIS — Z87448 Personal history of other diseases of urinary system: Secondary | ICD-10-CM | POA: Diagnosis not present

## 2018-04-29 DIAGNOSIS — Z8739 Personal history of other diseases of the musculoskeletal system and connective tissue: Secondary | ICD-10-CM | POA: Diagnosis not present

## 2018-04-29 DIAGNOSIS — Z96653 Presence of artificial knee joint, bilateral: Secondary | ICD-10-CM | POA: Diagnosis not present

## 2018-04-29 DIAGNOSIS — M0609 Rheumatoid arthritis without rheumatoid factor, multiple sites: Secondary | ICD-10-CM | POA: Diagnosis not present

## 2018-04-29 DIAGNOSIS — M818 Other osteoporosis without current pathological fracture: Secondary | ICD-10-CM

## 2018-04-29 DIAGNOSIS — Z952 Presence of prosthetic heart valve: Secondary | ICD-10-CM

## 2018-04-29 DIAGNOSIS — Z8709 Personal history of other diseases of the respiratory system: Secondary | ICD-10-CM

## 2018-04-29 LAB — CBC WITH DIFFERENTIAL/PLATELET
ABSOLUTE MONOCYTES: 455 {cells}/uL (ref 200–950)
Basophils Absolute: 70 cells/uL (ref 0–200)
Basophils Relative: 1.7 %
Eosinophils Absolute: 271 cells/uL (ref 15–500)
Eosinophils Relative: 6.6 %
HCT: 37.6 % (ref 35.0–45.0)
Hemoglobin: 13.1 g/dL (ref 11.7–15.5)
Lymphs Abs: 1287 cells/uL (ref 850–3900)
MCH: 32.6 pg (ref 27.0–33.0)
MCHC: 34.8 g/dL (ref 32.0–36.0)
MCV: 93.5 fL (ref 80.0–100.0)
MPV: 9.9 fL (ref 7.5–12.5)
Monocytes Relative: 11.1 %
Neutro Abs: 2017 cells/uL (ref 1500–7800)
Neutrophils Relative %: 49.2 %
Platelets: 141 10*3/uL (ref 140–400)
RBC: 4.02 10*6/uL (ref 3.80–5.10)
RDW: 13 % (ref 11.0–15.0)
Total Lymphocyte: 31.4 %
WBC: 4.1 10*3/uL (ref 3.8–10.8)

## 2018-04-29 NOTE — Telephone Encounter (Signed)
Received a fax from [employer] regarding a prior authorization for Enbrel. Authorization has been APPROVED from 05/04/2018 to 05/04/2019.   Will send document to scan center.  Phone # [phone]

## 2018-05-02 NOTE — Progress Notes (Signed)
CBC WNL

## 2018-05-03 ENCOUNTER — Encounter (HOSPITAL_COMMUNITY): Payer: Medicare HMO

## 2018-05-05 ENCOUNTER — Encounter: Payer: Self-pay | Admitting: Surgery

## 2018-05-06 NOTE — Telephone Encounter (Signed)
Received fax from [employer], renewal application has been APPROVED. Coverage is from 05/06/2018 to 05/04/2019.  Will send document to scan Center.  Phone# [phone] Fax# [phone]

## 2018-05-09 ENCOUNTER — Other Ambulatory Visit (HOSPITAL_COMMUNITY): Payer: Self-pay | Admitting: Internal Medicine

## 2018-05-09 ENCOUNTER — Other Ambulatory Visit: Payer: Self-pay | Admitting: Internal Medicine

## 2018-05-09 ENCOUNTER — Ambulatory Visit (HOSPITAL_COMMUNITY)
Admission: RE | Admit: 2018-05-09 | Discharge: 2018-05-09 | Disposition: A | Discharge status: Home / Self Care | Payer: PPO | Source: Ambulatory Visit | Attending: Internal Medicine | Admitting: Internal Medicine

## 2018-05-09 DIAGNOSIS — R600 Localized edema: Secondary | ICD-10-CM

## 2018-05-10 NOTE — Telephone Encounter (Signed)
Received fax from [employer], application has been APPROVED. Coverage is from 05/04/2018 to 05/04/2019.  Will send document to scan Center.  Phone# [phone] Fax# [phone]  10:05 AM Beatriz Chancellor, CPhT

## 2018-05-24 ENCOUNTER — Encounter: Payer: Self-pay | Admitting: Interventional Cardiology

## 2018-05-26 ENCOUNTER — Other Ambulatory Visit: Payer: Self-pay | Admitting: Internal Medicine

## 2018-06-09 NOTE — Progress Notes (Deleted)
Cardiology Office Note:    Date:  06/09/2018   ID:  Diane Abbott, DOB 07/07/1934, MRN [id number]  PCP:  Cassandria Anger, MD  Cardiologist:  No primary care provider on file.   Referring MD: Cassandria Anger, MD   No chief complaint on file.   History of Present Illness:    Diane Abbott is a 84 y.o. female with a hx of OA, RA, PE/DVT, and status post Bentall bioprosthetic valved conduit and ascending aorta/proximal arch replacement with known 4.8 cm arch of aorta aneurysm.  Past Medical History:  Diagnosis Date  . Adrenal insufficiency ([place])   . Anemia   . Anginal pain [facility])    "comes and goes has occured since 2012"  . Blood dyscrasia    "problems with platelets, red cells and white cells being low"  . Bronchitis Jan 2013-March 2013   severe  . Cancer ([place])    skin cancer on face  . Cataracts, bilateral    more in left than right  . Depression   . Esophagitis   . Fatty liver   . Fibromyalgia   . Fibromyalgia   . Foot pain, left 08/26/2016   2/18 1st MTP - gout vs other  . GERD (gastroesophageal reflux disease)   . Heart murmur   . History of blood clots 2008   below knee post trauma. Dilated [facility] no clotting disorder felt to be present.  . History of echocardiogram    Echo 11/16: Mild LVH, EF 60-65%, normal wall motion, grade 1 diastolic dysfunction, bioprosthetic AVR with mean gradient 21 mmHg and no regurgitation, proximal aortic arch aneurysm 49 mm, moderate TR, PASP 31 mmHg  . IBS (irritable bowel syndrome)   . Leaky heart valve   . Osteoarthritis   . Osteoarthritis of left shoulder 12/07/2011  . Osteopenia   . Pulmonary embolism ([place])   . RA (rheumatoid arthritis) ([place])   . Shortness of breath    attributed to aneurysm  . Sliding hiatal hernia   . Urinary leakage    when coughing  . Vertigo    hx of  . Vitamin B 12 deficiency   . Vitamin D deficiency     Past Surgical History:  Procedure Laterality Date  . ABDOMINAL  HYSTERECTOMY    . AORTA SURGERY  12/2012   Resection of aneurysm and valve replacement  . APPENDECTOMY  1956  . BENTALL PROCEDURE N/A 09/29/2012   Procedure: BENTALL PROCEDURE;  Surgeon: Gaye Pollack, MD;  Location: [place];  Service: Open Heart Surgery;  Laterality: N/A;  WITH CIRC ARREST  . BILATERAL OOPHORECTOMY    . BREAST BIOPSY Left   . CARDIAC CATHETERIZATION  09/15/12   no PCI  . CATARACT EXTRACTION Bilateral   . CHOLECYSTECTOMY  2003  . COLONOSCOPY    . COSMETIC SURGERY     Tummy tuck  . INTRAOPERATIVE TRANSESOPHAGEAL ECHOCARDIOGRAM N/A 09/29/2012   Procedure: INTRAOPERATIVE TRANSESOPHAGEAL ECHOCARDIOGRAM;  Surgeon: Gaye Pollack, MD;  Location: [facility] OR;  Service: Open Heart Surgery;  Laterality: N/A;  . JOINT REPLACEMENT Bilateral   . REPLACEMENT ASCENDING AORTA N/A 06/29/2016   Procedure: AORTIC ARCH REPLACEMENT WITH CIRC ARREST (N/A) - RIGHT AXILLARY CANNULATION  AORTO-AORTIC 28 HEMASHIELD GRAFT WITH AORTO-INNOMINATE AND AORTO-LEFT CAROTID ANASTAMOSIS;  Surgeon: Gaye Pollack, MD;  Location: [place] OR;  Service: Open Heart Surgery;  Laterality: N/A;  RIGHT AXILLARY CANNULATION  BILATERAL RADIAL A-LINE  . SKIN CANCER EXCISION Right    cheek  .  TEE WITHOUT CARDIOVERSION N/A 06/29/2016   Procedure: TRANSESOPHAGEAL ECHOCARDIOGRAM (TEE);  Surgeon: Gaye Pollack, MD;  Location: [place];  Service: Open Heart Surgery;  Laterality: N/A;  . TONSILLECTOMY  1941  . TOTAL KNEE ARTHROPLASTY     bilateral  . TOTAL SHOULDER ARTHROPLASTY  12/07/2011   Procedure: TOTAL SHOULDER ARTHROPLASTY;  Surgeon: Johnny Bridge, MD;  Location: [place];  Service: Orthopedics;  Laterality: Left;  left total shoulder artthroplasty  . WRIST SURGERY     bilateral    Current Medications: No outpatient medications have been marked as taking for the 06/10/18 encounter (Appointment) with Belva Crome, MD.     Allergies:   Acetaminophen; Codeine; Pramipexole dihydrochloride; Rofecoxib; Venlafaxine; Arava [leflunomide];  Clonazepam; Diclofenac sodium; Alprazolam; Darvocet [propoxyphene n-acetaminophen]; Duloxetine; Gabapentin; Latex; Morphine and related; Nortriptyline hcl; Penicillins; and Prednisone   Social History   Socioeconomic History  . Marital status: Divorced    Spouse name: Not on file  . Number of children: Not on file  . Years of education: Not on file  . Highest education level: Not on file  Occupational History  . Occupation: retired    Fish farm manager: RETIRED  Social Needs  . Financial resource strain: Not on file  . Food insecurity:    Worry: Not on file    Inability: Not on file  . Transportation needs:    Medical: Not on file    Non-medical: Not on file  Tobacco Use  . Smoking status: Never Smoker  . Smokeless tobacco: Never Used  . Tobacco comment: She describes COPD related to secondhand exposure from her family and husband  Substance and Sexual Activity  . Alcohol use: Yes    Comment: occ   . Drug use: Never  . Sexual activity: Not on file  Lifestyle  . Physical activity:    Days per week: Not on file    Minutes per session: Not on file  . Stress: Not on file  Relationships  . Social connections:    Talks on phone: Not on file    Gets together: Not on file    Attends religious service: Not on file    Active member of club or organization: Not on file    Attends meetings of clubs or organizations: Not on file    Relationship status: Not on file  Other Topics Concern  . Not on file  Social History Narrative  . Not on file     Family History: The patient's family history includes Breast cancer in an other family member; COPD in her father; Clotting disorder in an other family member; Diabetes in her mother; Heart disease in her maternal grandmother; Ovarian cancer in her mother; Pancreatic cancer in her paternal uncle. There is no history of Colon cancer.  ROS:   Please see the history of present illness.    *** All other systems reviewed and are  negative.  EKGs/Labs/Other Studies Reviewed:    The following studies were reviewed today: 2D Doppler echocardiogram August 31, 2017: Study Conclusions  - Left ventricle: The cavity size was normal. There was mild   concentric hypertrophy. Systolic function was normal. The   estimated ejection fraction was in the range of 55% to 60%. Wall   motion was normal; there were no regional wall motion   abnormalities. Features are consistent with a pseudonormal left   ventricular filling pattern, with concomitant abnormal relaxation   and increased filling pressure (grade 2 diastolic dysfunction).   Doppler parameters are  consistent with indeterminate ventricular   filling pressure. - Aortic valve: A bioprosthesis was present. Peak velocity (S): 303   cm/s. Mean gradient (S): 19 mm Hg. - Aorta: Ascending aortic diameter: 37 mm (S). - Ascending aorta: The ascending aorta was mildly dilated. - Mitral valve: Calcified annulus. Transvalvular velocity was   within the normal range. There was no evidence for stenosis.   There was trivial regurgitation. - Left atrium: The atrium was severely dilated. - Right ventricle: The cavity size was normal. Wall thickness was   normal. Systolic function was normal. - Atrial septum: No defect or patent foramen ovale was identified. - Tricuspid valve: There was moderate regurgitation. - Pulmonary arteries: Systolic pressure was within the normal   range. PA peak pressure: 32 mm Hg (S).  Impressions:  - Bioprosthetic aortic valve functioning properly. Gradients mildly   increased and unchanged from 06/2016.  Chest aortic CT Angiogram 09/22/2017: IMPRESSION: 1. Normal patency of replaced aortic arch without residual aneurysmal disease. Implanted innominate and left common carotid artery show normal patency. 2. There is a small focal outpouching of the lumen of the graft anteriorly just beyond the takeoff of the left common carotid artery measuring  approximately 5 x 10 mm and communicating with the lumen of the graft. This is presumably postoperative and can be followed on future imaging studies.  EKG:  EKG ***  Recent Labs: 11/15/2017: ALT 12 12/28/2017: Pro B Natriuretic peptide (BNP) 190.0 04/08/2018: BUN 31; Creatinine, Ser 1.29; Potassium 4.3; Sodium 142; TSH 3.95 04/29/2018: Hemoglobin 13.1; Platelets 141  Recent Lipid Panel No results found for: CHOL, TRIG, HDL, CHOLHDL, VLDL, LDLCALC, LDLDIRECT  Physical Exam:    VS:  There were no vitals taken for this visit.    Wt Readings from Last 3 Encounters:  04/29/18 168 lb 12.8 oz (76.6 kg)  04/08/18 174 lb (78.9 kg)  12/28/17 163 lb (73.9 kg)     GEN: ***. No acute distress HEENT: Normal NECK: No JVD. LYMPHATICS: No lymphadenopathy CARDIAC: ***RRR.  *** murmur, ***gallop, ***edema VASCULAR: *** Pulses, *** bruits RESPIRATORY:  Clear to auscultation without rales, wheezing or rhonchi  ABDOMEN: Soft, non-tender, non-distended, No pulsatile mass, MUSCULOSKELETAL: No deformity  SKIN: Warm and dry NEUROLOGIC:  Alert and oriented x 3 PSYCHIATRIC:  Normal affect   ASSESSMENT:    1. S/P AVR (aortic valve replacement)   2. S/P ascending aortic replacement   3. Panlobular emphysema ([place])   4. Thoracic aortic aneurysm without rupture ([facility])    PLAN:    In order of problems listed above:  1. ***   Medication Adjustments/Labs and Tests Ordered: Current medicines are reviewed at length with the patient today.  Concerns regarding medicines are outlined above.  No orders of the defined types were placed in this encounter.  No orders of the defined types were placed in this encounter.   There are no Patient Instructions on file for this visit.   Signed, Sinclair Grooms, MD  06/09/2018 1:56 PM    [facility]

## 2018-06-10 ENCOUNTER — Ambulatory Visit: Payer: PPO | Admitting: Interventional Cardiology

## 2018-07-12 ENCOUNTER — Ambulatory Visit: Payer: Medicare HMO | Admitting: Internal Medicine

## 2018-07-15 ENCOUNTER — Encounter: Payer: Self-pay | Admitting: Internal Medicine

## 2018-07-15 ENCOUNTER — Other Ambulatory Visit: Payer: Self-pay

## 2018-07-15 ENCOUNTER — Ambulatory Visit (INDEPENDENT_AMBULATORY_CARE_PROVIDER_SITE_OTHER): Payer: PPO | Admitting: Internal Medicine

## 2018-07-15 VITALS — BP 126/76 | HR 68 | Temp 97.8°F | Ht 60.0 in | Wt 168.0 lb

## 2018-07-15 DIAGNOSIS — I712 Thoracic aortic aneurysm, without rupture, unspecified: Secondary | ICD-10-CM

## 2018-07-15 DIAGNOSIS — R109 Unspecified abdominal pain: Secondary | ICD-10-CM | POA: Insufficient documentation

## 2018-07-15 DIAGNOSIS — R1084 Generalized abdominal pain: Secondary | ICD-10-CM

## 2018-07-15 DIAGNOSIS — K219 Gastro-esophageal reflux disease without esophagitis: Secondary | ICD-10-CM

## 2018-07-15 MED ORDER — PB-HYOSCY-ATROPINE-SCOPOLAMINE 16.2 MG PO TABS: 1.0000 | ORAL_TABLET | Freq: Three times a day (TID) | ORAL | 3 refills | Status: DC | PRN

## 2018-07-15 MED ORDER — PANTOPRAZOLE SODIUM 40 MG PO TBEC: 40.0000 mg | DELAYED_RELEASE_TABLET | Freq: Every day | ORAL | 3 refills | Status: DC

## 2018-07-15 NOTE — Assessment & Plan Note (Signed)
Seems stable

## 2018-07-15 NOTE — Patient Instructions (Addendum)
Rice sock heating pad Tylenol PM Call if problems

## 2018-07-15 NOTE — Assessment & Plan Note (Signed)
Start Protonix 

## 2018-07-15 NOTE — Progress Notes (Signed)
Subjective:  Patient ID: Diane Abbott, female    DOB: [birth year]  Age: 84 y.o. MRN: [id number]  CC: No chief complaint on file.   HPI Diane Abbott presents for chronic pain, edema, osteoporosis C/o abd cramps x weeks  Outpatient Medications Prior to Visit  Medication Sig Dispense Refill   aspirin 325 MG tablet Take 325 mg by mouth daily.     BREO ELLIPTA 100-25 MCG/INH AEPB INHALE 1 PUFF INTO THE LUNGS DAILY 60 each 1   etanercept (ENBREL SURECLICK) 50 MG/ML injection Inject 0.98 mLs (50 mg total) into the skin once a week. (Patient taking differently: Inject 50 mg into the skin every 21 ( twenty-one) days. ) 11.76 mL 0   FORTEO 600 MCG/2.4ML SOLN INJECT 0.08ML (20MCG) UNDER THE SKIN ONCE DAILY 7.2 mL 0   rOPINIRole (REQUIP) 4 MG tablet TAKE 1 TABLET BY MOUTH 3 TIMES A DAY 90 tablet 11   torsemide (DEMADEX) 20 MG tablet TAKE 1-2 TABLETS BY MOUTH DAILY AS NEEDED FOR SWELLING 180 tablet 1   No facility-administered medications prior to visit.     ROS: Review of Systems  Constitutional: Negative for activity change, appetite change, chills, fatigue and unexpected weight change.  HENT: Negative for congestion, mouth sores and sinus pressure.   Eyes: Negative for visual disturbance.  Respiratory: Negative for cough and chest tightness.   Gastrointestinal: Positive for abdominal pain. Negative for nausea and vomiting.  Genitourinary: Negative for difficulty urinating, frequency and vaginal pain.  Musculoskeletal: Positive for arthralgias, back pain and gait problem.  Skin: Negative for pallor and rash.  Neurological: Negative for dizziness, tremors, weakness, numbness and headaches.  Psychiatric/Behavioral: Negative for confusion, sleep disturbance and suicidal ideas.    Objective:  BP 126/76 (BP Location: Left Arm, Patient Position: Sitting, Cuff Size: Normal)    Pulse 68    Temp 97.8 F (36.6 C) (Oral)    Ht 5' (1.524 m)    Wt 168 lb (76.2 kg)    SpO2 99%    BMI 32.81  kg/m   BP Readings from Last 3 Encounters:  07/15/18 126/76  04/29/18 122/70  04/08/18 134/68    Wt Readings from Last 3 Encounters:  07/15/18 168 lb (76.2 kg)  04/29/18 168 lb 12.8 oz (76.6 kg)  04/08/18 174 lb (78.9 kg)    Physical Exam Constitutional:      General: She is not in acute distress.    Appearance: She is well-developed.  HENT:     Head: Normocephalic.     Right Ear: External ear normal.     Left Ear: External ear normal.     Nose: Nose normal.  Eyes:     General:        Right eye: No discharge.        Left eye: No discharge.     Conjunctiva/sclera: Conjunctivae normal.     Pupils: Pupils are equal, round, and reactive to light.  Neck:     Musculoskeletal: Normal range of motion and neck supple.     Thyroid: No thyromegaly.     Vascular: No JVD.     Trachea: No tracheal deviation.  Cardiovascular:     Rate and Rhythm: Normal rate and regular rhythm.     Heart sounds: Normal heart sounds.  Pulmonary:     Effort: No respiratory distress.     Breath sounds: No stridor. No wheezing.  Abdominal:     General: Bowel sounds are normal. There is no distension.  Palpations: Abdomen is soft. There is no mass.     Tenderness: There is no abdominal tenderness. There is no guarding or rebound.  Musculoskeletal:        General: No tenderness.  Lymphadenopathy:     Cervical: No cervical adenopathy.  Skin:    Findings: No erythema or rash.  Neurological:     Cranial Nerves: No cranial nerve deficit.     Motor: No abnormal muscle tone.     Coordination: Coordination normal.     Deep Tendon Reflexes: Reflexes normal.  Psychiatric:        Behavior: Behavior normal.        Thought Content: Thought content normal.        Judgment: Judgment normal.     Lab Results  Component Value Date   WBC 4.1 04/29/2018   HGB 13.1 04/29/2018   HCT 37.6 04/29/2018   PLT 141 04/29/2018   GLUCOSE 101 (H) 04/08/2018   ALT 12 11/15/2017   AST 24 11/15/2017   NA 142  04/08/2018   K 4.3 04/08/2018   CL 105 04/08/2018   CREATININE 1.29 (H) 04/08/2018   BUN 31 (H) 04/08/2018   CO2 29 04/08/2018   TSH 3.95 04/08/2018   INR 1.48 06/29/2016   HGBA1C 5.5 06/24/2016    Vas [place] Lower Extremity Venous (dvt)  Result Date: 05/09/2018  Lower Venous Study Indications: Edema.  Risk Factors: DVT past history of DVT with PE. Performing Technologist: Ronal Fear RVS, RCS  Examination Guidelines: A complete evaluation includes B-mode imaging, spectral Doppler, color Doppler, and power Doppler as needed of all accessible portions of each vessel. Bilateral testing is considered an integral part of a complete examination. Limited examinations for reoccurring indications may be performed as noted.  Right Venous Findings: +---+---------------+---------+-----------+----------+-------+      Compressibility Phasicity Spontaneity Properties Summary  +---+---------------+---------+-----------+----------+-------+  CFV Full            Yes       Yes                             +---+---------------+---------+-----------+----------+-------+  SFJ Full                                                      +---+---------------+---------+-----------+----------+-------+  Left Venous Findings: +---------+---------------+---------+-----------+----------+-------+            Compressibility Phasicity Spontaneity Properties Summary  +---------+---------------+---------+-----------+----------+-------+  CFV       Full            Yes       Yes                             +---------+---------------+---------+-----------+----------+-------+  SFJ       Full                                                      +---------+---------------+---------+-----------+----------+-------+  FV Prox   Full            Yes       Yes                             +---------+---------------+---------+-----------+----------+-------+  FV Mid    Full            Yes       Yes                              +---------+---------------+---------+-----------+----------+-------+  FV Distal Full            Yes       Yes                             +---------+---------------+---------+-----------+----------+-------+  POP       Full            Yes       Yes                             +---------+---------------+---------+-----------+----------+-------+  PTV       Full                                                      +---------+---------------+---------+-----------+----------+-------+  PERO      Full                                                      +---------+---------------+---------+-----------+----------+-------+  GSV       Full                                                      +---------+---------------+---------+-----------+----------+-------+    Summary: Right: No evidence of common femoral vein obstruction. Left: There is no evidence of deep vein thrombosis in the lower extremity.There is no evidence of superficial venous thrombosis.  *See table(s) above for measurements and observations. Electronically signed by Harold Barban MD on 05/09/2018 at 3:17:44 PM.    Final     Assessment & Plan:   There are no diagnoses linked to this encounter.   No orders of the defined types were placed in this encounter.    Follow-up: No follow-ups on file.  Walker Kehr, MD

## 2018-07-15 NOTE — Assessment & Plan Note (Addendum)
H/o IBS Cramping pain - related to ROM ?MSK Protonix Donnatal Tylenol PM Heat Abd [place] if not better

## 2018-08-01 ENCOUNTER — Other Ambulatory Visit: Payer: Self-pay | Admitting: Surgery

## 2018-08-01 DIAGNOSIS — I712 Thoracic aortic aneurysm, without rupture, unspecified: Secondary | ICD-10-CM

## 2018-08-01 DIAGNOSIS — Z952 Presence of prosthetic heart valve: Secondary | ICD-10-CM

## 2018-08-01 DIAGNOSIS — I359 Nonrheumatic aortic valve disorder, unspecified: Secondary | ICD-10-CM

## 2018-08-08 ENCOUNTER — Telehealth: Payer: Self-pay | Admitting: Cardiovascular Disease

## 2018-08-08 NOTE — Telephone Encounter (Signed)
Patient called and left voicemail  and discussed cancelling elective echocardiogram due to concerns for Covid19 and need for social distancing . Patient stable with no urgent symptoms.  TTE scheduled for 4/14 can be rescheduled for 3-6 months

## 2018-08-16 ENCOUNTER — Other Ambulatory Visit (HOSPITAL_COMMUNITY): Payer: PPO

## 2018-08-24 ENCOUNTER — Telehealth: Payer: Self-pay | Admitting: Interventional Cardiology

## 2018-08-24 NOTE — Telephone Encounter (Signed)
Patient set up for MyChart?  YES -PATIENT GAVE VERBAL CONSENT  Is patient using Smartphone/computer/tabletSMART PHONE  Did audio/video work?  Does patient need telephone visit? MAYBE IF CANT FIGURE OUT VIRTUAL VISIT  Best phone number to use? [place] Phone Call  "(Name), I am calling you today to discuss your upcoming appointment. We are currently trying to limit exposure to the virus that causes COVID-19 by seeing patients at home rather than in the office."  1. "What is the BEST phone number to call the day of the visit?" - include this in appointment notes  2. Do you have or have access to (through a family member/friend) a smartphone with video capability that we can use for your visit?" a. If yes - list this number in appt notes as cell (if different from BEST phone #) and list the appointment type as a VIDEO visit in appointment notes b. If no - list the appointment type as a PHONE visit in appointment notes  3. Confirm consent - "In the setting of the current Covid19 crisis, you are scheduled for a (phone or video) visit with your provider on (date) at (time).  Just as we do with many in-office visits, in order for you to participate in this visit, we must obtain consent.  If you'd like, I can send this to your mychart (if signed up) or email for you to review.  Otherwise, I can obtain your verbal consent now.  All virtual visits are billed to your insurance company just like a normal visit would be.  By agreeing to a virtual visit, we'd like you to understand that the technology does not allow for your provider to perform an examination, and thus may limit your provider's ability to fully assess your condition. If your provider identifies any concerns that need to be evaluated in person, we will make arrangements to do so.  Finally, though the technology is pretty good, we cannot assure that it will always work on either your or our end, and in  the setting of a video visit, we may have to convert it to a phone-only visit.  In either situation, we cannot ensure that we have a secure connection.  Are you willing to proceed?" STAFF: Did the patient verbally acknowledge consent to telehealth visit? Document YES/NO here: YES   4. Advise patient to be prepared - "Two hours prior to your appointment, go ahead and check your blood pressure, pulse, oxygen saturation, and your weight (if you have the equipment to check those) and write them all down. When your visit starts, your provider will ask you for this information. If you have an Apple Watch or Kardia device, please plan to have heart rate information ready on the day of your appointment. Please have a pen and paper handy nearby the day of the visit as well."  5. Give patient instructions for MyChart download to smartphone OR Doximity/Doxy.me as below if video visit (depending on what platform provider is using)  6. Inform patient they will receive a phone call 15 minutes prior to their appointment time (may be from unknown caller ID) so they should be prepared to answer    TELEPHONE CALL NOTE  Diane Abbott has been deemed a candidate for a follow-up tele-health visit to limit community exposure during the Covid-19 pandemic. I spoke with the patient via phone to ensure availability of phone/video source, confirm preferred email & phone number, and discuss  instructions and expectations.  I reminded Diane Abbott to be prepared with any vital sign and/or heart rhythm information that could potentially be obtained via home monitoring, at the time of her visit. I reminded Diane Abbott to expect a phone call prior to her visit.  Howie Ill 08/24/2018 2:58 PM   INSTRUCTIONS FOR DOWNLOADING THE MYCHART APP TO SMARTPHONE  - The patient must first make sure to have activated MyChart and know their login information - If Apple, go to [employer] and type in MyChart in the search bar and  download the app. If Android, ask patient to go to [employer] and type in [place] in the search bar and download the app. The app is free but as with any other app downloads, their phone may require them to verify saved payment information or Apple/Android password.  - The patient will need to then log into the app with their MyChart username and password, and select [facility] as their healthcare provider to link the account. When it is time for your visit, go to the MyChart app, find appointments, and click Begin Video Visit. Be sure to Select Allow for your device to access the Microphone and Camera for your visit. You will then be connected, and your provider will be with you shortly.  **If they have any issues connecting, or need assistance please contact MyChart service desk [phone])**  **If using a computer, in order to ensure the best quality for their visit they will need to use either of the following Internet Browsers: [employer], or Google Chrome**  IF USING DOXIMITY or DOXY.ME - The patient will receive a link just prior to their visit by text.     FULL LENGTH CONSENT FOR TELE-HEALTH VISIT   I hereby voluntarily request, consent and authorize [facility] and its employed or contracted physicians, physician assistants, nurse practitioners or other licensed health care professionals (the Practitioner), to provide me with telemedicine health care services (the Services") as deemed necessary by the treating Practitioner. I acknowledge and consent to receive the Services by the Practitioner via telemedicine. I understand that the telemedicine visit will involve communicating with the Practitioner through live audiovisual communication technology and the disclosure of certain medical information by electronic transmission. I acknowledge that I have been given the opportunity to request an in-person assessment or other available alternative prior to the  telemedicine visit and am voluntarily participating in the telemedicine visit.  I understand that I have the right to withhold or withdraw my consent to the use of telemedicine in the course of my care at any time, without affecting my right to future care or treatment, and that the Practitioner or I may terminate the telemedicine visit at any time. I understand that I have the right to inspect all information obtained and/or recorded in the course of the telemedicine visit and may receive copies of available information for a reasonable fee.  I understand that some of the potential risks of receiving the Services via telemedicine include:   Delay or interruption in medical evaluation due to technological equipment failure or disruption;  Information transmitted may not be sufficient (e.g. poor resolution of images) to allow for appropriate medical decision making by the Practitioner; and/or   In rare instances, security protocols could fail, causing a breach of personal health information.  Furthermore, I acknowledge that it is my responsibility to provide information about my medical history, conditions and care that is complete and accurate  to the best of my ability. I acknowledge that Practitioner's advice, recommendations, and/or decision may be based on factors not within their control, such as incomplete or inaccurate data provided by me or distortions of diagnostic images or specimens that may result from electronic transmissions. I understand that the practice of medicine is not an exact science and that Practitioner makes no warranties or guarantees regarding treatment outcomes. I acknowledge that I will receive a copy of this consent concurrently upon execution via email to the email address I last provided but may also request a printed copy by calling the office of [facility].    I understand that my insurance will be billed for this visit.   I have read or had this consent read to  me.  I understand the contents of this consent, which adequately explains the benefits and risks of the Services being provided via telemedicine.   I have been provided ample opportunity to ask questions regarding this consent and the Services and have had my questions answered to my satisfaction.  I give my informed consent for the services to be provided through the use of telemedicine in my medical care  By participating in this telemedicine visit I agree to the above.

## 2018-08-29 NOTE — Progress Notes (Signed)
Virtual Visit via Video Note   This visit type was conducted due to national recommendations for restrictions regarding the COVID-19 Pandemic (e.g. social distancing) in an effort to limit this patient's exposure and mitigate transmission in our community.  Due to her co-morbid illnesses, this patient is at least at moderate risk for complications without adequate follow up.  This format is felt to be most appropriate for this patient at this time.  All issues noted in this document were discussed and addressed.  A limited physical exam was performed with this format.  Please refer to the patient's chart for her consent to telehealth for [facility].   Evaluation Performed:  Follow-up visit  Date:  08/30/2018   ID:  Diane Abbott, DOB 08-Jan-1935, MRN [id number]  Patient Location: Home Provider Location: Office  PCP:  Cassandria Anger, MD  Cardiologist:  No primary care provider on file.  Electrophysiologist:  None   Chief Complaint:  AVR/Aortic disease  History of Present Illness:    Diane Abbott is a 84 y.o. female with  OA, RA, PE/DVT, and status post Bentall bioprosthetic valve conduit and ascending aorta/proximal arch replacement 2014. Also replacement of her aneurysmal aortic arch with aorto-innominate and aorto-left carotid artery bypasson 06/29/2016.    The patient is noted lower extremity swelling for several months.  She has been previously managed by Dr. Aundra Dubin.  She is now also being managed for aortic disease by Dr. Cyndia Bent.  This is our first encounter.  She has no specific complaints other than concern about "heart failure" and poor kidney function.  She states that this is why Dr. Alain Marion referred her back to cardiology although it is taken several months to be assigned a cardiologist.  The lower extremity swelling is relatively chronic now greater than 3 months in duration.  States her breathing is not as good as it previously has been but no dramatic worsening.   She has no significant orthopnea.  She is ambulatory.  She denies chest pain.  She has not had blood in her urine or stool.  The patient does not have symptoms concerning for COVID-19 infection (fever, chills, cough, or new shortness of breath).    Past Medical History:  Diagnosis Date  . Adrenal insufficiency ([place])   . Anemia   . Anginal pain [facility])    "comes and goes has occured since 2012"  . Blood dyscrasia    "problems with platelets, red cells and white cells being low"  . Bronchitis Jan 2013-March 2013   severe  . Cancer ([place])    skin cancer on face  . Cataracts, bilateral    more in left than right  . Depression   . Esophagitis   . Fatty liver   . Fibromyalgia   . Fibromyalgia   . Foot pain, left 08/26/2016   2/18 1st MTP - gout vs other  . GERD (gastroesophageal reflux disease)   . Heart murmur   . History of blood clots 2008   below knee post trauma. Dilated [facility] no clotting disorder felt to be present.  . History of echocardiogram    Echo 11/16: Mild LVH, EF 60-65%, normal wall motion, grade 1 diastolic dysfunction, bioprosthetic AVR with mean gradient 21 mmHg and no regurgitation, proximal aortic arch aneurysm 49 mm, moderate TR, PASP 31 mmHg  . IBS (irritable bowel syndrome)   . Leaky heart valve   . Osteoarthritis   . Osteoarthritis of left shoulder 12/07/2011  . Osteopenia   .  Pulmonary embolism ([place])   . RA (rheumatoid arthritis) ([place])   . Shortness of breath    attributed to aneurysm  . Sliding hiatal hernia   . Urinary leakage    when coughing  . Vertigo    hx of  . Vitamin B 12 deficiency   . Vitamin D deficiency    Past Surgical History:  Procedure Laterality Date  . ABDOMINAL HYSTERECTOMY    . AORTA SURGERY  12/2012   Resection of aneurysm and valve replacement  . APPENDECTOMY  1956  . BENTALL PROCEDURE N/A 09/29/2012   Procedure: BENTALL PROCEDURE;  Surgeon: Gaye Pollack, MD;  Location: [facility];  Service: Open Heart Surgery;   Laterality: N/A;  WITH CIRC ARREST  . BILATERAL OOPHORECTOMY    . BREAST BIOPSY Left   . CARDIAC CATHETERIZATION  09/15/12   no PCI  . CATARACT EXTRACTION Bilateral   . CHOLECYSTECTOMY  2003  . COLONOSCOPY    . COSMETIC SURGERY     Tummy tuck  . INTRAOPERATIVE TRANSESOPHAGEAL ECHOCARDIOGRAM N/A 09/29/2012   Procedure: INTRAOPERATIVE TRANSESOPHAGEAL ECHOCARDIOGRAM;  Surgeon: Gaye Pollack, MD;  [facility] OR;  Service: Open Heart Surgery;  Laterality: N/A;  . JOINT REPLACEMENT Bilateral   . REPLACEMENT ASCENDING AORTA N/A 06/29/2016   Procedure: AORTIC ARCH REPLACEMENT WITH CIRC ARREST (N/A) - RIGHT AXILLARY CANNULATION  AORTO-AORTIC 28 HEMASHIELD GRAFT WITH AORTO-INNOMINATE AND AORTO-LEFT CAROTID ANASTAMOSIS;  Surgeon: Gaye Pollack, MD;  Location: [facility] OR;  Service: Open Heart Surgery;  Laterality: N/A;  RIGHT AXILLARY CANNULATION  BILATERAL RADIAL A-LINE  . SKIN CANCER EXCISION Right    cheek  . TEE WITHOUT CARDIOVERSION N/A 06/29/2016   Procedure: TRANSESOPHAGEAL ECHOCARDIOGRAM (TEE);  Surgeon: Gaye Pollack, MD;  Location: [place];  Service: Open Heart Surgery;  Laterality: N/A;  . TONSILLECTOMY  1941  . TOTAL KNEE ARTHROPLASTY     bilateral  . TOTAL SHOULDER ARTHROPLASTY  12/07/2011   Procedure: TOTAL SHOULDER ARTHROPLASTY;  Surgeon: Johnny Bridge, MD;  Location: [facility];  Service: Orthopedics;  Laterality: Left;  left total shoulder artthroplasty  . WRIST SURGERY     bilateral     Current Meds  Medication Sig  . aspirin 325 MG tablet Take 325 mg by mouth daily.  Marland Kitchen BREO ELLIPTA 100-25 MCG/INH AEPB INHALE 1 PUFF INTO THE LUNGS DAILY  . etanercept (ENBREL SURECLICK) 50 MG/ML injection Inject 0.98 mLs (50 mg total) into the skin once a week.  . pantoprazole (PROTONIX) 40 MG tablet Take 1 tablet (40 mg total) by mouth daily.  Marland Kitchen rOPINIRole (REQUIP) 4 MG tablet TAKE 1 TABLET BY MOUTH 3 TIMES A DAY  . torsemide (DEMADEX) 20 MG tablet TAKE 1-2 TABLETS BY MOUTH DAILY AS NEEDED FOR  SWELLING     Allergies:   Acetaminophen; Codeine; Pramipexole dihydrochloride; Rofecoxib; Venlafaxine; Arava [leflunomide]; Clonazepam; Diclofenac sodium; Alprazolam; Darvocet [propoxyphene n-acetaminophen]; Duloxetine; Gabapentin; Latex; Morphine and related; Nortriptyline hcl; Penicillins; and Prednisone   Social History   Tobacco Use  . Smoking status: Never Smoker  . Smokeless tobacco: Never Used  . Tobacco comment: She describes COPD related to secondhand exposure from her family and husband  Substance Use Topics  . Alcohol use: Yes    Comment: occ   . Drug use: Never     Family Hx: The patient's family history includes Breast cancer in an other family member; COPD in her father; Clotting disorder in an other family member; Diabetes in her mother; Heart disease in her maternal  grandmother; Ovarian cancer in her mother; Pancreatic cancer in her paternal uncle. There is no history of Colon cancer.  ROS:   Please see the history of present illness.    She has significant rheumatoid arthritis.  She uses Enbrel.  This influences her kidney function according to her understanding. All other systems reviewed and are negative.   Prior CV studies:   The following studies were reviewed today:  2D Echocardiogram 08/31/2017: Study Conclusions  - Left ventricle: The cavity size was normal. There was mild   concentric hypertrophy. Systolic function was normal. The   estimated ejection fraction was in the range of 55% to 60%. Wall   motion was normal; there were no regional wall motion   abnormalities. Features are consistent with a pseudonormal left   ventricular filling pattern, with concomitant abnormal relaxation   and increased filling pressure (grade 2 diastolic dysfunction).   Doppler parameters are consistent with indeterminate ventricular   filling pressure. - Aortic valve: A bioprosthesis was present. Peak velocity (S): 303   cm/s. Mean gradient (S): 19 mm Hg. - Aorta:  Ascending aortic diameter: 37 mm (S). - Ascending aorta: The ascending aorta was mildly dilated. - Mitral valve: Calcified annulus. Transvalvular velocity was   within the normal range. There was no evidence for stenosis.   There was trivial regurgitation. - Left atrium: The atrium was severely dilated. - Right ventricle: The cavity size was normal. Wall thickness was   normal. Systolic function was normal. - Atrial septum: No defect or patent foramen ovale was identified. - Tricuspid valve: There was moderate regurgitation. - Pulmonary arteries: Systolic pressure was within the normal   range. PA peak pressure: 32 mm Hg (S).  Impressions:  - Bioprosthetic aortic valve functioning properly. Gradients mildly   increased and unchanged from 06/2016.  CHEST CT SCAN 09/22/2017:  IMPRESSION: 1. Normal patency of replaced aortic arch without residual aneurysmal disease. Implanted innominate and left common carotid artery show normal patency. 2. There is a small focal outpouching of the lumen of the graft anteriorly just beyond the takeoff of the left common carotid artery measuring approximately 5 x 10 mm and communicating with the lumen of the graft. This is presumably postoperative and can be followed on future imaging studies.  Labs/Other Tests and Data Reviewed:    EKG:  An ECG dated  was personally reviewed today and demonstrated:    Recent Labs: 11/15/2017: ALT 12 12/28/2017: Pro B Natriuretic peptide (BNP) 190.0 04/08/2018: BUN 31; Creatinine, Ser 1.29; Potassium 4.3; Sodium 142; TSH 3.95 04/29/2018: Hemoglobin 13.1; Platelets 141   Recent Lipid Panel No results found for: CHOL, TRIG, HDL, CHOLHDL, LDLCALC, LDLDIRECT  Wt Readings from Last 3 Encounters:  07/15/18 168 lb (76.2 kg)  04/29/18 168 lb 12.8 oz (76.6 kg)  04/08/18 174 lb (78.9 kg)     Objective:    Vital Signs:  BP 130/74   Pulse 75   Ht 5' (1.524 m)   SpO2 96%   BMI 32.81 kg/m    VITAL SIGNS:   reviewed GEN:  Elderly and frail in appearance NEURO:  alert and oriented x 3, no obvious focal deficit  ASSESSMENT & PLAN:    1. Chronic diastolic heart failure ([place])   2. Thoracic aortic aneurysm without rupture ([place])   3. Chronic renal insufficiency, stage III (moderate) (H[facility])   4. Panlobular emphysema (H[facility])   5. PULMONARY EMBOLISM, HX OF   6. S/P AVR (aortic valve replacement)    PLAN:  1. The patient does have diastolic dysfunction.  The lower extremity swelling is somewhat concerning.  This is the only evidence of volume overload however.  She is not short of breath.  A BNP done twice in mid to late 2019 was essentially normal (less than 300).  She needs to be seen in the office to determine if there is clinical evidence of heart failure.  She is stable currently.  We will plan to do an echocardiogram in mid summer and have her seen shortly thereafter unless symptoms worsen/develop. 2. Aneurysm is being followed by Dr. Cyndia Bent 3. The most recent laboratory data reveals stable/improved kidney function.  CKD stage II-III 4. Not discussed. 5. Not discussed 6. When last evaluated aortic valve function was normal.  Repeat echo was ordered.  She does not want to come to the office doing the pandemic.  We have made plans to have her echocardiogram done in approximately 2 months when our protocol for COVID-1, Face-to-face evaluation is in full force.  She will be seen by me shortly after her echo.  COVID-19 Education: The signs and symptoms of COVID-19 were discussed with the patient and how to seek care for testing (follow up with PCP or arrange E-visit).  The importance of social distancing was discussed today.  Time:   Today, I have spent 20 minutes with the patient with telehealth technology discussing the above problems.     Medication Adjustments/Labs and Tests Ordered: Current medicines are reviewed at length with the patient today.  Concerns regarding medicines are outlined  above.   Tests Ordered: No orders of the defined types were placed in this encounter.   Medication Changes: No orders of the defined types were placed in this encounter.   Disposition:  Follow up in 8 month(s)  Signed, Sinclair Grooms, MD  08/30/2018 1:30 PM    [facility]

## 2018-08-30 ENCOUNTER — Other Ambulatory Visit: Payer: Self-pay

## 2018-08-30 ENCOUNTER — Telehealth (INDEPENDENT_AMBULATORY_CARE_PROVIDER_SITE_OTHER): Payer: PPO | Admitting: Interventional Cardiology

## 2018-08-30 ENCOUNTER — Encounter: Payer: Self-pay | Admitting: Interventional Cardiology

## 2018-08-30 VITALS — BP 130/74 | HR 75 | Ht 60.0 in

## 2018-08-30 DIAGNOSIS — I712 Thoracic aortic aneurysm, without rupture, unspecified: Secondary | ICD-10-CM

## 2018-08-30 DIAGNOSIS — I5032 Chronic diastolic (congestive) heart failure: Secondary | ICD-10-CM

## 2018-08-30 DIAGNOSIS — N183 Chronic kidney disease, stage 3 unspecified: Secondary | ICD-10-CM

## 2018-08-30 DIAGNOSIS — J431 Panlobular emphysema: Secondary | ICD-10-CM

## 2018-08-30 DIAGNOSIS — N2889 Other specified disorders of kidney and ureter: Secondary | ICD-10-CM

## 2018-08-30 DIAGNOSIS — Z952 Presence of prosthetic heart valve: Secondary | ICD-10-CM

## 2018-08-30 DIAGNOSIS — Z86718 Personal history of other venous thrombosis and embolism: Secondary | ICD-10-CM

## 2018-08-30 MED ORDER — ASPIRIN 81 MG PO TABS: 81.0000 mg | ORAL_TABLET | Freq: Every day | ORAL | Status: DC

## 2018-08-30 NOTE — Patient Instructions (Addendum)
Medication Instructions:  1) DECREASE ASPIRIN to 81 mg daily   Testing/Procedures: Your provider has requested that you have an echocardiogram. Echocardiography is a painless test that uses sound waves to create images of your heart. It provides your doctor with information about the size and shape of your heart and how well your heart's chambers and valves are working. This procedure takes approximately one hour. There are no restrictions for this procedure.  Your echo is scheduled 12/28/2018. Please arrive by 11:00AM.     Follow-Up: You have a follow up appointment scheduled with Dr. Tamala Julian 12/30/2018 at 11:20AM.

## 2018-09-09 ENCOUNTER — Other Ambulatory Visit: Payer: Self-pay | Admitting: Internal Medicine

## 2018-09-12 ENCOUNTER — Telehealth: Payer: Self-pay | Admitting: Internal Medicine

## 2018-09-12 NOTE — Telephone Encounter (Signed)
Copied from [place] [phone]. Topic: Quick Communication - Rx Refill/Question >> Sep 12, 2018  1:33 PM Blase Mess A wrote: Medication: rOPINIRole (REQUIP) 4 MG tablet [[id number]]   Has the patient contacted their pharmacy? Yes  (Agent: If no, request that the patient contact the pharmacy for the refill.) (Agent: If yes, when and what did the pharmacy advise?)  Preferred Pharmacy (with phone number or street name):  [id number] - [place],  - [address] [phone] (Phone) [phone] (Fax)     Agent: Please be advised that RX refills may take up to 3 business days. We ask that you follow-up with your pharmacy.

## 2018-09-13 MED ORDER — ROPINIROLE HCL 4 MG PO TABS: 4.0000 mg | ORAL_TABLET | Freq: Three times a day (TID) | ORAL | 11 refills | Status: DC

## 2018-09-13 NOTE — Telephone Encounter (Signed)
RX sent

## 2018-09-16 NOTE — Progress Notes (Signed)
Virtual visit Note  Patient: Diane Abbott             Date of Birth: 1934/11/10           MRN: [id number]             PCP: Cassandria Anger, MD Referring: Cassandria Anger, MD Visit Date: 09/30/2018 Occupation: @GUAROCC @  This service was conducted via virtual visit.  She does not have a microphone, so she was reached by telephone. The patient was located at home. I was located in my office.  Consent was obtained prior to the virtual visit and is aware of possible charges through their insurance for this visit.  The patient is an established patient.  Dr. Estanislado Pandy, MD conducted the virtual visit and Hazel Sams, PA-C acted as scribe during the service.  Office staff helped with scheduling follow up visits after the service was conducted.   Provider: Clinic  Patient: Home   Subjective:  Generalized pain     History of Present Illness: Diane Abbott is a 84 y.o. female with history of seronegative rheumatoid arthritis and fibromyalgia.  She is on Enbrel 50 mg sq injections every 21 days.  She has pain in both hands.  She denies any joint swelling.  She has generalized pain due to fibromyalgia.   She discontinued Forteo, due to it causing dizziness and frequent falls.    Activities of Daily Living:  Patient reports morning stiffness for 30 minutes.   Patient Reports nocturnal pain.  Difficulty dressing/grooming: Denies Difficulty climbing stairs: Reports Difficulty getting out of chair: Reports Difficulty using hands for taps, buttons, cutlery, and/or writing: Reports  Review of Systems  Constitutional: Positive for fatigue.  HENT: Negative for mouth sores, mouth dryness and nose dryness.   Eyes: Negative for pain, visual disturbance and dryness.  Respiratory: Negative for cough, hemoptysis, shortness of breath and difficulty breathing.   Cardiovascular: Negative for chest pain, palpitations, hypertension and swelling in legs/feet.  Gastrointestinal: Negative for  blood in stool, constipation and diarrhea.  Endocrine: Negative for increased urination.  Genitourinary: Negative for painful urination.  Musculoskeletal: Positive for arthralgias, joint pain, joint swelling, myalgias, morning stiffness, muscle tenderness and myalgias. Negative for muscle weakness.  Skin: Negative for color change, pallor, rash, hair loss, nodules/bumps, skin tightness, ulcers and sensitivity to sunlight.  Allergic/Immunologic: Negative for susceptible to infections.  Neurological: Negative for dizziness, numbness, headaches and weakness.  Hematological: Negative for swollen glands.  Psychiatric/Behavioral: Positive for sleep disturbance. Negative for depressed mood. The patient is not nervous/anxious.     PMFS History:  Patient Active Problem List   Diagnosis Date Noted   Abdominal pain 07/15/2018   History of left shoulder replacement 11/13/2016   History of total knee replacement, bilateral 11/13/2016   History of myeloproliferative disorder 09/17/2016   History of IBS 09/17/2016   Diskitis 08/29/2015   Acute osteomyelitis of lumbar spine ([place]) 07/29/2015   Thoracic compression fracture ([place]) 07/29/2015   Discitis of lumbosacral region    Epidural abscess    Chronic renal insufficiency, stage III (moderate) (HCC) 02/28/2015   Hammer toe, acquired 11/29/2014   S/P ascending aortic replacement 10/04/2012   S/P AVR (aortic valve replacement) 10/04/2012   Aortic aneurysm, thoracic ([place]) 09/14/2012   COPD (chronic obstructive pulmonary disease) ([place]) 03/12/2011   RESTLESS LEG SYNDROME 02/03/2010   PULMONARY EMBOLISM 09/14/2008   Chest pain 07/13/2007   GERD 07/08/2007   Irritable bowel syndrome 07/08/2007   Chronic pain 07/08/2007  OSTEOARTHRITIS 03/10/2007   Osteopenia 03/10/2007   PULMONARY EMBOLISM, HX OF 03/10/2007   Rheumatoid arthritis ([place]) 11/23/2006    Past Medical History:  Diagnosis Date   Adrenal insufficiency ([place])      Anemia    Anginal pain ([place])    "comes and goes has occured since 2012"   Blood dyscrasia    "problems with platelets, red cells and white cells being low"   Bronchitis Jan 2013-March 2013   severe   Cancer [facility])    skin cancer on face   Cataracts, bilateral    more in left than right   Depression    Esophagitis    Fatty liver    Fibromyalgia    Fibromyalgia    Foot pain, left 08/26/2016   2/18 1st MTP - gout vs other   GERD (gastroesophageal reflux disease)    Heart murmur    History of blood clots 2008   below knee post trauma. Dilated [facility] no clotting disorder felt to be present.   History of echocardiogram    Echo 11/16: Mild LVH, EF 60-65%, normal wall motion, grade 1 diastolic dysfunction, bioprosthetic AVR with mean gradient 21 mmHg and no regurgitation, proximal aortic arch aneurysm 49 mm, moderate TR, PASP 31 mmHg   IBS (irritable bowel syndrome)    Leaky heart valve    Osteoarthritis    Osteoarthritis of left shoulder 12/07/2011   Osteopenia    Pulmonary embolism (HCC)    RA (rheumatoid arthritis) (HCC)    Shortness of breath    attributed to aneurysm   Sliding hiatal hernia    Urinary leakage    when coughing   Vertigo    hx of   Vitamin B 12 deficiency    Vitamin D deficiency     Family History  Problem Relation Age of Onset   Ovarian cancer Mother    Diabetes Mother    COPD Father    Heart disease Maternal Grandmother    Clotting disorder Other        Father, brother, sister, and daughter all had deep venous thrombosis   Breast cancer Other    Pancreatic cancer Paternal Uncle    Colon cancer Neg Hx    Past Surgical History:  Procedure Laterality Date   ABDOMINAL HYSTERECTOMY     AORTA SURGERY  12/2012   Resection of aneurysm and valve replacement   APPENDECTOMY  1956   BENTALL PROCEDURE N/A 09/29/2012   Procedure: BENTALL PROCEDURE;  Surgeon: Gaye Pollack, MD;  Location: [place];  Service: Open  Heart Surgery;  Laterality: N/A;  WITH CIRC ARREST   BILATERAL OOPHORECTOMY     BREAST BIOPSY Left    CARDIAC CATHETERIZATION  09/15/12   no PCI   CATARACT EXTRACTION Bilateral    CHOLECYSTECTOMY  2003   COLONOSCOPY     COSMETIC SURGERY     Tummy tuck   INTRAOPERATIVE TRANSESOPHAGEAL ECHOCARDIOGRAM N/A 09/29/2012   Procedure: INTRAOPERATIVE TRANSESOPHAGEAL ECHOCARDIOGRAM;  Surgeon: Gaye Pollack, MD;  Location: [facility];  Service: Open Heart Surgery;  Laterality: N/A;   JOINT REPLACEMENT Bilateral    REPLACEMENT ASCENDING AORTA N/A 06/29/2016   Procedure: AORTIC ARCH REPLACEMENT WITH CIRC ARREST (N/A) - RIGHT AXILLARY CANNULATION  AORTO-AORTIC 28 HEMASHIELD GRAFT WITH AORTO-INNOMINATE AND AORTO-LEFT CAROTID ANASTAMOSIS;  Surgeon: Gaye Pollack, MD;  Location: [place];  Service: Open Heart Surgery;  Laterality: N/A;  RIGHT AXILLARY CANNULATION  BILATERAL RADIAL A-LINE   SKIN CANCER EXCISION Right  cheek   TEE WITHOUT CARDIOVERSION N/A 06/29/2016   Procedure: TRANSESOPHAGEAL ECHOCARDIOGRAM (TEE);  Surgeon: Gaye Pollack, MD;  Location: [place];  Service: Open Heart Surgery;  Laterality: N/A;   TONSILLECTOMY  1941   TOTAL KNEE ARTHROPLASTY     bilateral   TOTAL SHOULDER ARTHROPLASTY  12/07/2011   Procedure: TOTAL SHOULDER ARTHROPLASTY;  Surgeon: Johnny Bridge, MD;  Location: [place];  Service: Orthopedics;  Laterality: Left;  left total shoulder artthroplasty   WRIST SURGERY     bilateral   Social History   Social History Narrative   Not on file   Immunization History  Administered Date(s) Administered   Influenza Whole 03/10/2007, 02/01/2008   Pneumococcal Conjugate-13 03/23/2013   Pneumococcal Polysaccharide-23 02/03/2010   Td 03/21/2012   Zoster 11/04/2010      Physical Exam  Constitutional: She is oriented to person, place, and time.  Neurological: She is alert and oriented to person, place, and time.  Psychiatric: Mood, memory, affect and judgment  normal.     Imaging: No results found.  Recent Labs: Lab Results  Component Value Date   WBC 4.1 04/29/2018   HGB 13.1 04/29/2018   PLT 141 04/29/2018   NA 142 04/08/2018   K 4.3 04/08/2018   CL 105 04/08/2018   CO2 29 04/08/2018   GLUCOSE 101 (H) 04/08/2018   BUN 31 (H) 04/08/2018   CREATININE 1.29 (H) 04/08/2018   BILITOT 0.5 11/15/2017   ALKPHOS 85 04/05/2017   AST 24 11/15/2017   ALT 12 11/15/2017   PROT 7.5 11/15/2017   ALBUMIN 4.4 04/05/2017   CALCIUM 9.4 04/08/2018   GFRAA 41 (L) 11/15/2017   QFTBGOLD TNP 03/12/2015   QFTBGOLDPLUS NEGATIVE 11/15/2017    Speciality Comments: [place] therapy included: Simponi, Arava, Cimzia and Humira (inadequate response), methotrexate and Remicade (intolerance)  Procedures:  No procedures performed Allergies: Acetaminophen; Codeine; Pramipexole dihydrochloride; Rofecoxib; Venlafaxine; Arava [leflunomide]; Clonazepam; Diclofenac sodium; Alprazolam; Darvocet [propoxyphene n-acetaminophen]; Duloxetine; Gabapentin; Latex; Morphine and related; Nortriptyline hcl; Penicillins; and Prednisone   Assessment / Plan:     Visit Diagnoses: Rheumatoid arthritis of multiple sites with negative rheumatoid factor (HCC) -  Severe erosive disease: She is having pain and intermittent swelling of both hands.  She has morning stiffness lasting 30-60 minutes.  She has been injecting Enbrel 50 mg every 21 days.  She has not missed any doses recently.  She denies any recent infections.  She declined prednisone at this time. She is overdue for lab work, and she will have labs drawn next week.  She will continue on the current treatment regimen.  She will follow up in 3 months.  Rheumatoid nodulosis (HCC)  High risk medication use -  Enbrel 50 mg subcutaneous every 3 weeks. Last TB gold negative on 11/15/2017 and will monitor yearly.  Most recent CBC within normal limits on 04/29/2018.  Most recent BMP Showed elevated serum creatinine and will GFR but improving  on 04/08/2018.  Last CMP showed normal LFTs on 11/15/2017.  Due for CBC/CMP today and will monitor every 3 months.  Standing orders are in place.  She has received Zostavax, Pneumovax 23 and Prevnar 13 vaccine. Previous treatments: Simponi Donalda Ewings was held in January 2017 due to discitis, inadequate response-Cimzia, Humira, intolerance-MTX& Remicade)   Fibromyalgia: She has generalized muscle ache and muscle tenderness due to fibromyalgia.    History of left shoulder replacement - Dr. Maeola Sarah pain   History of total knee replacement, bilateral: Doing well.   Other osteoporosis  without current pathological fracture: She discontinued Forteo due to it causing dizziness and frequent falls.  We discussed starting her on Reclast IV infusions.  She will require updated lab work prior to applying for reclast.   Other medical conditions are listed as follows:   History of chronic kidney disease  History of vitamin D deficiency  History of discitis  History of vertebral fracture  Moderate episode of recurrent major depressive disorder (HCC)  History of COPD  History of DVT (deep vein thrombosis)  S/P AVR (aortic valve replacement)  History of gastroesophageal reflux (GERD)  History of myeloproliferative disorder  H/O aortic arch replacement  History of IBS  Vitamin D deficiency   Orders: No orders of the defined types were placed in this encounter.  No orders of the defined types were placed in this encounter.   Face-to-face time spent with patient was 25 minutes. Greater than 50% of time was spent in counseling and coordination of care.  Follow-Up Instructions: She will follow up in 3 months.   Bo Merino, MD   Scribed by-  Ofilia Neas, PA-C     Note - This record has been created using Dragon software.  Chart creation errors have been sought, but may not always  have been located. Such creation errors do not reflect on  the standard of medical care.

## 2018-09-25 ENCOUNTER — Other Ambulatory Visit: Payer: Self-pay | Admitting: Internal Medicine

## 2018-09-30 ENCOUNTER — Telehealth (INDEPENDENT_AMBULATORY_CARE_PROVIDER_SITE_OTHER): Payer: PPO | Admitting: Rheumatology

## 2018-09-30 ENCOUNTER — Other Ambulatory Visit: Payer: Self-pay

## 2018-09-30 ENCOUNTER — Encounter: Payer: Self-pay | Admitting: Rheumatology

## 2018-09-30 DIAGNOSIS — Z96653 Presence of artificial knee joint, bilateral: Secondary | ICD-10-CM

## 2018-09-30 DIAGNOSIS — M0609 Rheumatoid arthritis without rheumatoid factor, multiple sites: Secondary | ICD-10-CM

## 2018-09-30 DIAGNOSIS — Z87448 Personal history of other diseases of urinary system: Secondary | ICD-10-CM

## 2018-09-30 DIAGNOSIS — E559 Vitamin D deficiency, unspecified: Secondary | ICD-10-CM

## 2018-09-30 DIAGNOSIS — Z79899 Other long term (current) drug therapy: Secondary | ICD-10-CM

## 2018-09-30 DIAGNOSIS — Z8781 Personal history of (healed) traumatic fracture: Secondary | ICD-10-CM

## 2018-09-30 DIAGNOSIS — Z8739 Personal history of other diseases of the musculoskeletal system and connective tissue: Secondary | ICD-10-CM

## 2018-09-30 DIAGNOSIS — Z8639 Personal history of other endocrine, nutritional and metabolic disease: Secondary | ICD-10-CM

## 2018-09-30 DIAGNOSIS — Z952 Presence of prosthetic heart valve: Secondary | ICD-10-CM

## 2018-09-30 DIAGNOSIS — Z96612 Presence of left artificial shoulder joint: Secondary | ICD-10-CM

## 2018-09-30 DIAGNOSIS — M797 Fibromyalgia: Secondary | ICD-10-CM

## 2018-09-30 DIAGNOSIS — Z8709 Personal history of other diseases of the respiratory system: Secondary | ICD-10-CM

## 2018-09-30 DIAGNOSIS — F331 Major depressive disorder, recurrent, moderate: Secondary | ICD-10-CM

## 2018-09-30 DIAGNOSIS — Z86718 Personal history of other venous thrombosis and embolism: Secondary | ICD-10-CM

## 2018-09-30 DIAGNOSIS — M818 Other osteoporosis without current pathological fracture: Secondary | ICD-10-CM

## 2018-09-30 DIAGNOSIS — Z8719 Personal history of other diseases of the digestive system: Secondary | ICD-10-CM

## 2018-09-30 DIAGNOSIS — M0630 Rheumatoid nodule, unspecified site: Secondary | ICD-10-CM

## 2018-09-30 DIAGNOSIS — Z95828 Presence of other vascular implants and grafts: Secondary | ICD-10-CM

## 2018-09-30 DIAGNOSIS — Z862 Personal history of diseases of the blood and blood-forming organs and certain disorders involving the immune mechanism: Secondary | ICD-10-CM

## 2018-10-05 ENCOUNTER — Ambulatory Visit (INDEPENDENT_AMBULATORY_CARE_PROVIDER_SITE_OTHER): Payer: PPO | Admitting: Surgery

## 2018-10-05 ENCOUNTER — Other Ambulatory Visit: Payer: Self-pay

## 2018-10-05 ENCOUNTER — Encounter: Payer: Self-pay | Admitting: Surgery

## 2018-10-05 ENCOUNTER — Ambulatory Visit
Admission: RE | Admit: 2018-10-05 | Discharge: 2018-10-05 | Disposition: A | Discharge status: Home / Self Care | Payer: PPO | Source: Ambulatory Visit | Attending: Surgery | Admitting: Surgery

## 2018-10-05 VITALS — BP 113/72 | HR 84 | Temp 97.9°F | Resp 16 | Ht 60.0 in | Wt 168.4 lb

## 2018-10-05 DIAGNOSIS — I712 Thoracic aortic aneurysm, without rupture, unspecified: Secondary | ICD-10-CM

## 2018-10-05 MED ORDER — IOPAMIDOL (ISOVUE-370) INJECTION 76%: 75.0000 mL | Freq: Once | INTRAVENOUS | Status: DC | PRN

## 2018-10-05 MED ORDER — IOPAMIDOL (ISOVUE-370) INJECTION 76%
50.0000 mL | Freq: Once | INTRAVENOUS | Status: AC | PRN
  Administered 2018-10-05: 50 mL via INTRAVENOUS

## 2018-10-05 NOTE — Progress Notes (Signed)
HPI:  The patient is an 84 year old woman with rheumatoid arthritis, fibromyalgia, IBS, DM and multiple other medial problems who underwent Bentall procedure using a composite pericardial valve/conduit and replacement of the ascending aorta and proximal aortic arch on 09/29/2012.  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   I last saw her in May 2019 at which time a CT scan of the chest showed mild aneurysmal enlargement of the distal arch and proximal descending thoracic aorta measuring 4.0 cm.  This was unchanged from her prior CT scan.  Since I last saw her she said that she has had more problems with her left knee which have been replaced in the past.  She is now walking with a cane.    Current Outpatient Medications  Medication Sig Dispense Refill  . aspirin 81 MG tablet Take 1 tablet (81 mg total) by mouth daily.    Marland Kitchen BREO ELLIPTA 100-25 MCG/INH AEPB INHALE 1 PUFF BY MOUTH ONCE DAILY 60 each 1  . etanercept (ENBREL SURECLICK) 50 MG/ML injection Inject 0.98 mLs (50 mg total) into the skin once a week. 11.76 mL 0  . pantoprazole (PROTONIX) 40 MG tablet Take 1 tablet (40 mg total) by mouth daily. 30 tablet 3  . rOPINIRole (REQUIP) 4 MG tablet Take 1 tablet (4 mg total) by mouth 3 (three) times daily. 90 tablet 11  . torsemide (DEMADEX) 20 MG tablet TAKE 1-2 TABLETS BY MOUTH DAILY AS NEEDED FOR SWELLING 180 tablet 1   No current facility-administered medications for this visit.      Physical Exam: BP 113/72 (BP Location: Left Arm, Patient Position: Sitting, Cuff Size: Normal)   Pulse 84   Temp 97.9 F (36.6 C) (Skin)   Resp 16   Ht 5' (1.524 m)   Wt 168 lb 6.4 oz (76.4 kg)   SpO2 95% Comment: RA  BMI 32.89 kg/m  She looks well for age but is moving slowly with her cane due to left knee arthritis. Cardiac exam shows  a regular rate and rhythm with a 2/6 systolic flow murmur across the prosthetic aortic valve.  There is no diastolic murmur. Lungs are clear.   Diagnostic Tests:  CLINICAL DATA:  Thoracic aortic aneurysm without rupture. History of aortic surgery in 2014 and 2018.  EXAM: CT ANGIOGRAPHY CHEST WITH CONTRAST  TECHNIQUE: Multidetector CT imaging of the chest was performed using the standard protocol during bolus administration of intravenous contrast. Multiplanar CT image reconstructions and MIPs were obtained to evaluate the vascular anatomy.  CONTRAST:  42mL ISOVUE-370 IOPAMIDOL (ISOVUE-370) INJECTION 76%  COMPARISON:  09/22/2017  FINDINGS: Cardiovascular: Patient has a prosthetic aortic valve. Replacement of the ascending thoracic aorta and aortic arch. The aortic grafts are widely patent. Innominate artery and left common carotid artery anastomoses are patent. Again noted is a focal outpouching in the anterior aortic arch graft that measures roughly 10 x 5 mm on sequence 5, image 42. This is stable and probably associated with a surgical cannulation site. Left subclavian artery is patent. Bilateral vertebral arteries are patent. Aneurysm of the proximal descending thoracic aorta measuring up to 4.0 cm and previously measured 3.9 cm. Negative for an aortic dissection. Aorta at the hiatus measures roughly 3.0 cm and minimally changed. Celiac trunk, SMA and bilateral renal arteries are patent.  Mediastinum/Nodes: Prominent mediastinal tissue is similar to the previous  examination. Soft tissue in the precarinal region measures 2.2 cm in the AP dimension and stable. Additional prominent mediastinal lymph nodes are similar to the previous examination. Mildly prominent hilar tissue is similar to the most recent comparison examination. No significant axillary lymphadenopathy.  Lungs/Pleura: Trachea and mainstem bronchi are patent. No large pleural effusions. Stable  densities along the medial lower lobes are most compatible with areas of scarring.  Upper Abdomen: Cholecystectomy. Small hyperdensity or hypervascularity along the inferior right hepatic lobe is likely an incidental finding. There is probably at least mild atrophy in the kidneys.  Musculoskeletal: Left shoulder arthroplasty is located. Degenerative changes at the right shoulder joint. Marked kyphosis in the upper thoracic spine. Stable compression deformity at the T3 vertebral body. Stable chronic compression deformity involving the L1 vertebral body.  Review of the MIP images confirms the above findings.  IMPRESSION: 1. Stable postsurgical changes in the thoracic aorta. Replaced ascending thoracic aorta and aortic arch are patent. 2. Stable aneurysm of the native proximal descending thoracic aorta measuring roughly 4.0 cm. 3. Stable focal outpouching along the anterior aortic graft is probably related to a cannulation site. 4. No acute chest abnormality.   Electronically Signed   By: Markus Daft M.D.   On: 10/05/2018 11:47   Impression:  She has a stable aneurysm of the proximal descending thoracic aorta measuring 4.0 cm.  Her descending aorta is fairly tortuous.  There is no abnormality of the ascending aortic graft or the aortic arch graft and a branch graft to the innominate artery and left common carotid artery are patent.  I reviewed the CTA images with her and answered her questions.  I stressed the importance of continued good blood pressure control and preventing progressive enlargement of her descending aorta and aortic dissection.  This is well below the surgical threshold of 5.5 cm and I recommended continued surveillance.  Plan:  I will see her back in 1 year with a CTA of the chest.  I spent 15 minutes performing this established patient evaluation and > 50% of this time was spent face to face counseling and coordinating the care of this patient's aortic  aneurysm.    Gaye Pollack, MD [facility] [phone]

## 2018-10-07 ENCOUNTER — Telehealth: Payer: Self-pay | Admitting: Rheumatology

## 2018-10-07 DIAGNOSIS — M0609 Rheumatoid arthritis without rheumatoid factor, multiple sites: Secondary | ICD-10-CM

## 2018-10-07 NOTE — Telephone Encounter (Signed)
Attempted to contact the patient and left message for patient to advise we could not see results in the system where she had labs drawn on Wednesday. Advised patient we would need a CBC and a CMP.

## 2018-10-07 NOTE — Telephone Encounter (Signed)
Patient calling to let you know she had a aorta scan done, with labs drawn on Wednesday with a [employer]. Patient request a call if she needs any other labs drawn other than what was already done.

## 2018-10-12 MED ORDER — ETANERCEPT 50 MG/ML ~~LOC~~ SOAJ: 50.0000 mg | SUBCUTANEOUS | 0 refills | Status: DC

## 2018-10-12 NOTE — Telephone Encounter (Addendum)
Patient called requesting that we fax [facility] imaging to obtain lab results.  Reviewed [facility] imaging notes and it appears they only checked her serum creatinine prior to imaging.  Informed patient that I am happy to contact their office but do not believe they obtained all of the required lab work.  Our office needs CBC and CMP.  Patient verbalized understanding.  She requested a new prescription be sent to the pharmacy for her Enbrel.  Informed patient that she is overdue for labs and we will not send in a prescription until we receive lab work.  Patient is frustrated as she does not want to come in for labs and states that she is approaching her 21 days and will be in a lot of pain if she does not take the medication.  Reminded the patient the importance of routine lab monitoring to ensure that we are using Enbrel safely.  Patient continues to be frustrated but agreed to come in for lab work.  All questions encouraged and answered.  Instructed patient to call with any further questions or concerns.   After reviewing chart she has not had labs since December 2019 and last script was sent to the pharmacy in 2018.  It was sent with instructions of taking Enbrel every week instead of how she currently takes it every 21 days.  She ended up with a 12 month supply.  Updated prescription to reflect correct dosing schedule.  Mariella Saa, PharmD, [facility] Rheumatology Clinical Pharmacist  10/12/2018 10:02 AM

## 2018-10-12 NOTE — Addendum Note (Signed)
Addended by: Mariella Saa C on: 10/12/2018 10:08 AM   Modules accepted: Orders

## 2018-10-17 ENCOUNTER — Encounter (HOSPITAL_COMMUNITY): Payer: Self-pay

## 2018-10-17 ENCOUNTER — Emergency Department (HOSPITAL_COMMUNITY): Payer: PPO

## 2018-10-17 ENCOUNTER — Other Ambulatory Visit: Payer: Self-pay

## 2018-10-17 ENCOUNTER — Ambulatory Visit: Payer: PPO | Admitting: Internal Medicine

## 2018-10-17 ENCOUNTER — Emergency Department (HOSPITAL_COMMUNITY)
Admission: EM | Admit: 2018-10-17 | Discharge: 2018-10-17 | Disposition: A | Discharge status: Home / Self Care | Payer: PPO | Attending: Emergency Medicine | Admitting: Emergency Medicine

## 2018-10-17 DIAGNOSIS — W08XXXA Fall from other furniture, initial encounter: Secondary | ICD-10-CM | POA: Diagnosis not present

## 2018-10-17 DIAGNOSIS — S61411A Laceration without foreign body of right hand, initial encounter: Secondary | ICD-10-CM

## 2018-10-17 DIAGNOSIS — I714 Abdominal aortic aneurysm, without rupture: Secondary | ICD-10-CM | POA: Diagnosis not present

## 2018-10-17 DIAGNOSIS — R51 Headache: Secondary | ICD-10-CM | POA: Insufficient documentation

## 2018-10-17 DIAGNOSIS — Y9389 Activity, other specified: Secondary | ICD-10-CM | POA: Diagnosis not present

## 2018-10-17 DIAGNOSIS — W19XXXA Unspecified fall, initial encounter: Secondary | ICD-10-CM

## 2018-10-17 DIAGNOSIS — J449 Chronic obstructive pulmonary disease, unspecified: Secondary | ICD-10-CM | POA: Insufficient documentation

## 2018-10-17 DIAGNOSIS — R079 Chest pain, unspecified: Secondary | ICD-10-CM | POA: Insufficient documentation

## 2018-10-17 DIAGNOSIS — R0789 Other chest pain: Secondary | ICD-10-CM | POA: Diagnosis not present

## 2018-10-17 DIAGNOSIS — Z23 Encounter for immunization: Secondary | ICD-10-CM | POA: Insufficient documentation

## 2018-10-17 DIAGNOSIS — Z87891 Personal history of nicotine dependence: Secondary | ICD-10-CM | POA: Diagnosis not present

## 2018-10-17 DIAGNOSIS — S0990XA Unspecified injury of head, initial encounter: Secondary | ICD-10-CM | POA: Diagnosis not present

## 2018-10-17 DIAGNOSIS — Z952 Presence of prosthetic heart valve: Secondary | ICD-10-CM | POA: Insufficient documentation

## 2018-10-17 DIAGNOSIS — Z7982 Long term (current) use of aspirin: Secondary | ICD-10-CM | POA: Insufficient documentation

## 2018-10-17 DIAGNOSIS — Y92000 Kitchen of unspecified non-institutional (private) residence as  the place of occurrence of the external cause: Secondary | ICD-10-CM | POA: Insufficient documentation

## 2018-10-17 DIAGNOSIS — Y999 Unspecified external cause status: Secondary | ICD-10-CM | POA: Insufficient documentation

## 2018-10-17 DIAGNOSIS — S199XXA Unspecified injury of neck, initial encounter: Secondary | ICD-10-CM | POA: Diagnosis not present

## 2018-10-17 DIAGNOSIS — S61422A Laceration with foreign body of left hand, initial encounter: Secondary | ICD-10-CM | POA: Diagnosis not present

## 2018-10-17 DIAGNOSIS — S61223A Laceration with foreign body of left middle finger without damage to nail, initial encounter: Secondary | ICD-10-CM | POA: Diagnosis not present

## 2018-10-17 DIAGNOSIS — R1010 Upper abdominal pain, unspecified: Secondary | ICD-10-CM | POA: Diagnosis not present

## 2018-10-17 LAB — COMPREHENSIVE METABOLIC PANEL WITH GFR
ALT: 12 U/L (ref 0–44)
AST: 19 U/L (ref 15–41)
Albumin: 3.6 g/dL (ref 3.5–5.0)
Alkaline Phosphatase: 66 U/L (ref 38–126)
Anion gap: 7 (ref 5–15)
BUN: 27 mg/dL — ABNORMAL HIGH (ref 8–23)
CO2: 27 mmol/L (ref 22–32)
Calcium: 8.9 mg/dL (ref 8.9–10.3)
Chloride: 108 mmol/L (ref 98–111)
Creatinine, Ser: 1.34 mg/dL — ABNORMAL HIGH (ref 0.44–1.00)
GFR calc Af Amer: 43 mL/min — ABNORMAL LOW
GFR calc non Af Amer: 37 mL/min — ABNORMAL LOW
Glucose, Bld: 100 mg/dL — ABNORMAL HIGH (ref 70–99)
Potassium: 3.2 mmol/L — ABNORMAL LOW (ref 3.5–5.1)
Sodium: 142 mmol/L (ref 135–145)
Total Bilirubin: 0.7 mg/dL (ref 0.3–1.2)
Total Protein: 6.4 g/dL — ABNORMAL LOW (ref 6.5–8.1)

## 2018-10-17 LAB — CBC WITH DIFFERENTIAL/PLATELET
Abs Immature Granulocytes: 0.01 K/uL (ref 0.00–0.07)
Basophils Absolute: 0 K/uL (ref 0.0–0.1)
Basophils Relative: 1 %
Eosinophils Absolute: 0.2 K/uL (ref 0.0–0.5)
Eosinophils Relative: 6 %
HCT: 36.5 % (ref 36.0–46.0)
Hemoglobin: 11.6 g/dL — ABNORMAL LOW (ref 12.0–15.0)
Immature Granulocytes: 0 %
Lymphocytes Relative: 48 %
Lymphs Abs: 1.7 K/uL (ref 0.7–4.0)
MCH: 30.9 pg (ref 26.0–34.0)
MCHC: 31.8 g/dL (ref 30.0–36.0)
MCV: 97.3 fL (ref 80.0–100.0)
Monocytes Absolute: 0.3 K/uL (ref 0.1–1.0)
Monocytes Relative: 10 %
Neutro Abs: 1.2 K/uL — ABNORMAL LOW (ref 1.7–7.7)
Neutrophils Relative %: 35 %
Platelets: 96 K/uL — ABNORMAL LOW (ref 150–400)
RBC: 3.75 MIL/uL — ABNORMAL LOW (ref 3.87–5.11)
RDW: 13.2 % (ref 11.5–15.5)
WBC: 3.4 K/uL — ABNORMAL LOW (ref 4.0–10.5)
nRBC: 0 % (ref 0.0–0.2)

## 2018-10-17 LAB — TROPONIN I: Troponin I: <0.03 ng/mL (ref <0.03)

## 2018-10-17 LAB — LIPASE, BLOOD: Lipase: 46 U/L (ref 11–51)

## 2018-10-17 MED ORDER — TETANUS-DIPHTH-ACELL PERTUSSIS 5-2.5-18.5 LF-MCG/0.5 IM SUSP
0.5000 mL | Freq: Once | INTRAMUSCULAR | Status: AC
  Administered 2018-10-17: 0.5 mL via INTRAMUSCULAR
  Filled 2018-10-17: qty 0.5

## 2018-10-17 MED ORDER — FLUCONAZOLE 50 MG PO TABS: 50.0000 mg | ORAL_TABLET | Freq: Every day | ORAL | 0 refills | Status: AC

## 2018-10-17 MED ORDER — HYDROMORPHONE HCL 1 MG/ML IJ SOLN
0.5000 mg | Freq: Once | INTRAMUSCULAR | Status: AC
  Administered 2018-10-17: 0.5 mg via INTRAVENOUS
  Filled 2018-10-17: qty 1

## 2018-10-17 MED ORDER — IOHEXOL 350 MG/ML SOLN
100.0000 mL | Freq: Once | INTRAVENOUS | Status: AC | PRN
  Administered 2018-10-17: 75 mL via INTRAVENOUS

## 2018-10-17 MED ORDER — LIDOCAINE HCL (PF) 1 % IJ SOLN
30.0000 mL | Freq: Once | INTRAMUSCULAR | Status: AC
  Administered 2018-10-17: 30 mL
  Filled 2018-10-17: qty 30

## 2018-10-17 MED ORDER — FLUCONAZOLE 50 MG PO TABS
50.0000 mg | ORAL_TABLET | Freq: Once | ORAL | Status: DC
  Filled 2018-10-17: qty 1

## 2018-10-17 MED ORDER — SODIUM CHLORIDE 0.9 % IV BOLUS
500.0000 mL | Freq: Once | INTRAVENOUS | Status: AC
  Administered 2018-10-17: 500 mL via INTRAVENOUS

## 2018-10-17 MED ORDER — DOXYCYCLINE HYCLATE 100 MG PO CAPS: 100.0000 mg | ORAL_CAPSULE | Freq: Two times a day (BID) | ORAL | 0 refills | Status: DC

## 2018-10-17 NOTE — ED Notes (Signed)
Pt fingers have several cuts that pt states probably contain glass.  This RN attempted to clean pt's hands by soaking them in warm water.  Pt refused without it being numbed first.

## 2018-10-17 NOTE — ED Notes (Signed)
Erlene Quan PA at bedside.

## 2018-10-17 NOTE — ED Provider Notes (Addendum)
[facility] DEPT Provider Note   CSN: [id number] Arrival date & time: 10/17/18  0843    History   Chief Complaint Chief Complaint  Patient presents with   Fall    head, leg, and hand pain     HPI Diane Abbott is a 84 y.o. female presenting today following fall.  Patient reports that she was standing on the second rung of a stool attempting to change a light bulb when she lost her balance and fell forward.  Patient states that she attempted to catch herself on her hands shattering some glass and cutting her hand open before falling completely to the ground.  She states that she did strike the front of her head during the fall denies loss of consciousness, uses aspirin daily.  Patient's daughter was home and heard the fall, EMS was called to bring patient to the ER.  Patient's primary concerns are her bilateral hands that she has several cuts present from a glass light bulb bleeding controlled with quick clot and direct pressure by EMS, she endorses moderate intensity sharp burning sensation to her fingers worsened with movement and palpation and improved with rest.  Additional endorses headache mild throbbing constant without aggravating or alleviating factors.  Patient denies pain of the neck, back, chest abdomen pelvis or lower extremities.     HPI  Past Medical History:  Diagnosis Date   Adrenal insufficiency ([place])    Anemia    Anginal pain ([place])    "comes and goes has occured since 2012"   Blood dyscrasia    "problems with platelets, red cells and white cells being low"   Bronchitis Jan 2013-March 2013   severe   Cancer [facility])    skin cancer on face   Cataracts, bilateral    more in left than right   Depression    Esophagitis    Fatty liver    Fibromyalgia    Fibromyalgia    Foot pain, left 08/26/2016   2/18 1st MTP - gout vs other   GERD (gastroesophageal reflux disease)    Heart murmur    History of blood clots 2008     below knee post trauma. Dilated [facility] no clotting disorder felt to be present.   History of echocardiogram    Echo 11/16: Mild LVH, EF 60-65%, normal wall motion, grade 1 diastolic dysfunction, bioprosthetic AVR with mean gradient 21 mmHg and no regurgitation, proximal aortic arch aneurysm 49 mm, moderate TR, PASP 31 mmHg   IBS (irritable bowel syndrome)    Leaky heart valve    Osteoarthritis    Osteoarthritis of left shoulder 12/07/2011   Osteopenia    Pulmonary embolism (HCC)    RA (rheumatoid arthritis) (HCC)    Shortness of breath    attributed to aneurysm   Sliding hiatal hernia    Urinary leakage    when coughing   Vertigo    hx of   Vitamin B 12 deficiency    Vitamin D deficiency     Patient Active Problem List   Diagnosis Date Noted   Abdominal pain 07/15/2018   History of left shoulder replacement 11/13/2016   History of total knee replacement, bilateral 11/13/2016   History of myeloproliferative disorder 09/17/2016   History of IBS 09/17/2016   Diskitis 08/29/2015   Acute osteomyelitis of lumbar spine ([place]) 07/29/2015   Thoracic compression fracture ([place]) 07/29/2015   Discitis of lumbosacral region    Epidural abscess    Chronic renal  insufficiency, stage III (moderate) (HCC) 02/28/2015   Hammer toe, acquired 11/29/2014   S/P ascending aortic replacement 10/04/2012   S/P AVR (aortic valve replacement) 10/04/2012   Aortic aneurysm, thoracic ([place]) 09/14/2012   COPD (chronic obstructive pulmonary disease) ([place]) 03/12/2011   RESTLESS LEG SYNDROME 02/03/2010   PULMONARY EMBOLISM 09/14/2008   Chest pain 07/13/2007   GERD 07/08/2007   Irritable bowel syndrome 07/08/2007   Chronic pain 07/08/2007   OSTEOARTHRITIS 03/10/2007   Osteopenia 03/10/2007   PULMONARY EMBOLISM, HX OF 03/10/2007   Rheumatoid arthritis ([place]) 11/23/2006    Past Surgical History:  Procedure Laterality Date   ABDOMINAL HYSTERECTOMY      AORTA SURGERY  12/2012   Resection of aneurysm and valve replacement   APPENDECTOMY  1956   BENTALL PROCEDURE N/A 09/29/2012   Procedure: BENTALL PROCEDURE;  Surgeon: Gaye Pollack, MD;  Location: [place];  Service: Open Heart Surgery;  Laterality: N/A;  WITH CIRC ARREST   BILATERAL OOPHORECTOMY     BREAST BIOPSY Left    CARDIAC CATHETERIZATION  09/15/12   no PCI   CATARACT EXTRACTION Bilateral    CHOLECYSTECTOMY  2003   COLONOSCOPY     COSMETIC SURGERY     Tummy tuck   INTRAOPERATIVE TRANSESOPHAGEAL ECHOCARDIOGRAM N/A 09/29/2012   Procedure: INTRAOPERATIVE TRANSESOPHAGEAL ECHOCARDIOGRAM;  Surgeon: Gaye Pollack, MD;  Location: [place] OR;  Service: Open Heart Surgery;  Laterality: N/A;   JOINT REPLACEMENT Bilateral    REPLACEMENT ASCENDING AORTA N/A 06/29/2016   Procedure: AORTIC ARCH REPLACEMENT WITH CIRC ARREST (N/A) - RIGHT AXILLARY CANNULATION  AORTO-AORTIC 28 HEMASHIELD GRAFT WITH AORTO-INNOMINATE AND AORTO-LEFT CAROTID ANASTAMOSIS;  Surgeon: Gaye Pollack, MD;  Location: [place] [facility];  Service: Open Heart Surgery;  Laterality: N/A;  RIGHT AXILLARY CANNULATION  BILATERAL RADIAL A-LINE   SKIN CANCER EXCISION Right    cheek   TEE WITHOUT CARDIOVERSION N/A 06/29/2016   Procedure: TRANSESOPHAGEAL ECHOCARDIOGRAM (TEE);  Surgeon: Gaye Pollack, MD;  Location: [place];  Service: Open Heart Surgery;  Laterality: N/A;   TONSILLECTOMY  1941   TOTAL KNEE ARTHROPLASTY     bilateral   TOTAL SHOULDER ARTHROPLASTY  12/07/2011   Procedure: TOTAL SHOULDER ARTHROPLASTY;  Surgeon: Johnny Bridge, MD;  Location: [place];  Service: Orthopedics;  Laterality: Left;  left total shoulder artthroplasty   WRIST SURGERY     bilateral     OB History   No obstetric history on file.      Home Medications    Prior to Admission medications   Medication Sig Start Date End Date Taking? Authorizing Provider  aspirin 81 MG tablet Take 1 tablet (81 mg total) by mouth daily. 08/30/18  Yes Belva Crome, MD    BREO ELLIPTA 100-25 MCG/INH AEPB INHALE 1 PUFF BY MOUTH ONCE DAILY Patient taking differently: Inhale 1 puff into the lungs daily.  09/27/18  Yes Plotnikov, Evie Lacks, MD  etanercept (ENBREL SURECLICK) 50 MG/ML injection Inject 0.98 mLs (50 mg total) into the skin every 21 ( twenty-one) days. 10/12/18  Yes Deveshwar, Abel Presto, MD  pantoprazole (PROTONIX) 40 MG tablet Take 1 tablet (40 mg total) by mouth daily. Patient taking differently: Take 40 mg by mouth daily as needed (GERD).  07/15/18  Yes Plotnikov, Evie Lacks, MD  rOPINIRole (REQUIP) 4 MG tablet Take 1 tablet (4 mg total) by mouth 3 (three) times daily. 09/13/18  Yes Plotnikov, Evie Lacks, MD  torsemide (DEMADEX) 20 MG tablet TAKE 1-2 TABLETS BY MOUTH DAILY AS NEEDED FOR  SWELLING Patient taking differently: Take 20 mg by mouth 2 (two) times daily as needed (swelling).  03/10/18  Yes Plotnikov, Evie Lacks, MD  doxycycline (VIBRAMYCIN) 100 MG capsule Take 1 capsule (100 mg total) by mouth 2 (two) times daily for 7 days. 10/17/18 10/24/18  Nuala Alpha A, PA-C  fluconazole (DIFLUCAN) 50 MG tablet Take 1 tablet (50 mg total) by mouth daily for 1 day. 10/17/18 10/18/18  Deliah Boston, PA-C    Family History Family History  Problem Relation Age of Onset   Ovarian cancer Mother    Diabetes Mother    COPD Father    Heart disease Maternal Grandmother    Clotting disorder Other        Father, brother, sister, and daughter all had deep venous thrombosis   Breast cancer Other    Pancreatic cancer Paternal Uncle    Colon cancer Neg Hx     Social History Social History   Tobacco Use   Smoking status: Never Smoker   Smokeless tobacco: Never Used   Tobacco comment: She describes COPD related to secondhand exposure from her family and husband  Substance Use Topics   Alcohol use: Yes    Comment: occ    Drug use: Never     Allergies   Acetaminophen, Codeine, Pramipexole dihydrochloride, Rofecoxib, Venlafaxine, Arava  [leflunomide], Clonazepam, Diclofenac sodium, Alprazolam, Darvocet [propoxyphene n-acetaminophen], Duloxetine, Gabapentin, Latex, Morphine and related, Nortriptyline hcl, Penicillins, and Prednisone   Review of Systems Review of Systems  Constitutional: Negative for chills and fever.  Cardiovascular: Negative for chest pain.  Gastrointestinal: Negative for abdominal pain, nausea and vomiting.  Musculoskeletal: Positive for arthralgias (Bilateral hands). Negative for back pain and neck pain.  Skin: Positive for wound (Lacerations bilateral hands).  Neurological: Positive for headaches. Negative for syncope, weakness and numbness.   Physical Exam Updated Vital Signs BP (!) 109/56    Pulse (!) 54    Temp 97.8 F (36.6 C) (Oral)    Resp 11    SpO2 97%   Physical Exam Constitutional:      General: She is not in acute distress.    Appearance: Normal appearance. She is well-developed. She is not ill-appearing or diaphoretic.  HENT:     Head: Normocephalic and atraumatic. No raccoon eyes or Battle's sign.     Jaw: There is normal jaw occlusion. No trismus.     Right Ear: External ear normal. No hemotympanum.     Left Ear: External ear normal. No hemotympanum.     Nose: Nose normal. No rhinorrhea.     Right Nostril: No epistaxis.     Left Nostril: No epistaxis.     Mouth/Throat:     Mouth: Mucous membranes are moist.     Pharynx: Oropharynx is clear.  Eyes:     General: Vision grossly intact. Gaze aligned appropriately.     Extraocular Movements: Extraocular movements intact.     Conjunctiva/sclera: Conjunctivae normal.     Pupils: Pupils are equal, round, and reactive to light.  Neck:     Musculoskeletal: Full passive range of motion without pain, normal range of motion and neck supple. No spinous process tenderness or muscular tenderness.     Trachea: Trachea and phonation normal. No tracheal tenderness or tracheal deviation.  Cardiovascular:     Rate and Rhythm: Normal rate and  regular rhythm.     Pulses:          Radial pulses are 2+ on the right side and 2+ on  the left side.       Dorsalis pedis pulses are 2+ on the right side and 2+ on the left side.     Heart sounds: Normal heart sounds.  Pulmonary:     Effort: Pulmonary effort is normal. No accessory muscle usage or respiratory distress.     Breath sounds: Normal breath sounds and air entry.  Chest:     Chest wall: No tenderness.     Comments: No sign of injury to chest Abdominal:     General: There is no distension.     Palpations: Abdomen is soft.     Tenderness: There is no abdominal tenderness. There is no guarding or rebound.     Comments: No sign of injury to abdomen  Musculoskeletal: Normal range of motion.     Comments: No midline C/T/L spinal tenderness to palpation, no paraspinal muscle tenderness, no deformity, crepitus, or step-off noted. No sign of injury to the neck or back. - All major joints brought through range of motion without pain or crepitus.  Hips stable to compression bilaterally without pain, patient is able to bring bilateral knees to chest without pain.  She is able to bring herself to a seated position without assistance or difficulty.  Feet:     Right foot:     Protective Sensation: 3 sites tested. 3 sites sensed.     Left foot:     Protective Sensation: 3 sites tested. 3 sites sensed.  Skin:    General: Skin is warm and dry.     Capillary Refill: Capillary refill takes less than 2 seconds.     Comments: Patient with multiple lacerations and abrasions to bilateral hands.  Significant laceration to webspace between left third and fourth finger approximately 2.5 cm in length.   Approximately 1 cm laceration present to the left long finger, dorsal overlying middle phalanx. - Skin avulsion of right fourth PIP - Skin avulsion of right fifth PIP - Small skin avulsion right third PIP - Please see pictures below. - Capillary refill and sensation intact to all 10 fingers.   Appropriate range of motion and strength with all movements some increase in pain.  Neurological:     Mental Status: She is alert.     GCS: GCS eye subscore is 4. GCS verbal subscore is 5. GCS motor subscore is 6.     Comments: Speech is clear and goal oriented, follows commands Major Cranial nerves without deficit, no facial droop Moves extremities without ataxia, coordination intact  Psychiatric:        Behavior: Behavior normal.          ED Treatments / Results  Labs (all labs ordered are listed, but only abnormal results are displayed) Labs Reviewed  CBC WITH DIFFERENTIAL/PLATELET - Abnormal; Notable for the following components:      Result Value   WBC 3.4 (*)    RBC 3.75 (*)    Hemoglobin 11.6 (*)    Platelets 96 (*)    Neutro Abs 1.2 (*)    All other components within normal limits  COMPREHENSIVE METABOLIC PANEL - Abnormal; Notable for the following components:   Potassium 3.2 (*)    Glucose, Bld 100 (*)    BUN 27 (*)    Creatinine, Ser 1.34 (*)    Total Protein 6.4 (*)    GFR calc non Af Amer 37 (*)    GFR calc Af Amer 43 (*)    All other components within normal limits  LIPASE,  BLOOD  TROPONIN I    EKG EKG Interpretation  Date/Time:  Monday October 17 2018 12:56:33 EDT Ventricular Rate:  53 PR Interval:    QRS Duration: 91 QT Interval:  484 QTC Calculation: 455 R Axis:   37 Text Interpretation:  Sinus rhythm Prolonged PR interval Low voltage, precordial leads Nonspecific T abnormalities, lateral leads Baseline wander in lead(s) V6 Confirmed by Milton Ferguson [phone]) on 10/17/2018 4:41:16 PM   Radiology Ct Head Wo Contrast  Result Date: 10/17/2018 CLINICAL DATA:  Fall, head trauma. EXAM: CT HEAD WITHOUT CONTRAST CT CERVICAL SPINE WITHOUT CONTRAST TECHNIQUE: Multidetector CT imaging of the head and cervical spine was performed following the standard protocol without intravenous contrast. Multiplanar CT image reconstructions of the cervical spine were also  generated. COMPARISON:  Head CT and cervical spine CT dated 07/07/2013. Chest CT dated 09/22/2016. FINDINGS: CT HEAD FINDINGS Brain: Ventricles are stable in size and configuration. There is no mass, hemorrhage, edema or other evidence of acute parenchymal abnormality. No extra-axial hemorrhage. Vascular: No hyperdense vessel or unexpected calcification. Skull: Normal. Negative for fracture or focal lesion. Sinuses/Orbits: No acute finding. Other: None. CT CERVICAL SPINE FINDINGS Alignment: Moderate levoscoliosis which may be accentuated by patient positioning. No evidence of acute vertebral body subluxation. Skull base and vertebrae: No acute fracture line or displaced fracture fragment seen. Facet joints appear normally aligned throughout. Chronic compression fracture deformity of the T3 vertebral body. Soft tissues and spinal canal: No prevertebral fluid or swelling. No visible canal hematoma. Disc levels: Degenerative spondylosis throughout the cervical spine, moderate in degree but no more than mild central canal stenosis at any level. Upper chest: No acute findings. Other: Aortic atherosclerosis. IMPRESSION: 1. No acute intracranial abnormality. No intracranial mass, hemorrhage or edema. No skull fracture. 2. No fracture or acute subluxation within the cervical spine. Chronic compression fracture deformity of the T3 vertebral body. Degenerative changes throughout the cervical spine, moderate in degree but no more than mild central canal stenosis at any level. Aortic Atherosclerosis (ICD10-I70.0). Electronically Signed   By: Franki Cabot M.D.   On: 10/17/2018 10:57   Ct Cervical Spine Wo Contrast  Result Date: 10/17/2018 CLINICAL DATA:  Fall, head trauma. EXAM: CT HEAD WITHOUT CONTRAST CT CERVICAL SPINE WITHOUT CONTRAST TECHNIQUE: Multidetector CT imaging of the head and cervical spine was performed following the standard protocol without intravenous contrast. Multiplanar CT image reconstructions of the  cervical spine were also generated. COMPARISON:  Head CT and cervical spine CT dated 07/07/2013. Chest CT dated 09/22/2016. FINDINGS: CT HEAD FINDINGS Brain: Ventricles are stable in size and configuration. There is no mass, hemorrhage, edema or other evidence of acute parenchymal abnormality. No extra-axial hemorrhage. Vascular: No hyperdense vessel or unexpected calcification. Skull: Normal. Negative for fracture or focal lesion. Sinuses/Orbits: No acute finding. Other: None. CT CERVICAL SPINE FINDINGS Alignment: Moderate levoscoliosis which may be accentuated by patient positioning. No evidence of acute vertebral body subluxation. Skull base and vertebrae: No acute fracture line or displaced fracture fragment seen. Facet joints appear normally aligned throughout. Chronic compression fracture deformity of the T3 vertebral body. Soft tissues and spinal canal: No prevertebral fluid or swelling. No visible canal hematoma. Disc levels: Degenerative spondylosis throughout the cervical spine, moderate in degree but no more than mild central canal stenosis at any level. Upper chest: No acute findings. Other: Aortic atherosclerosis. IMPRESSION: 1. No acute intracranial abnormality. No intracranial mass, hemorrhage or edema. No skull fracture. 2. No fracture or acute subluxation within the cervical spine. Chronic compression  fracture deformity of the T3 vertebral body. Degenerative changes throughout the cervical spine, moderate in degree but no more than mild central canal stenosis at any level. Aortic Atherosclerosis (ICD10-I70.0). Electronically Signed   By: Franki Cabot M.D.   On: 10/17/2018 10:57   Dg Chest Portable 1 View  Result Date: 10/17/2018 CLINICAL DATA:  Sternal chest pain. EXAM: PORTABLE CHEST 1 VIEW COMPARISON:  Chest CT 10/05/2018 FINDINGS: Cardiomediastinal silhouette is stable with postsurgical changes of thoracic aortic replacement and aortic valve replacement. There is no evidence of focal  airspace consolidation, pleural effusion or pneumothorax. Mild left lower lobe atelectasis. Osseous structures are without acute abnormality. Soft tissues are grossly normal. IMPRESSION: Mild left lower lobe atelectasis. Electronically Signed   By: Fidela Salisbury M.D.   On: 10/17/2018 13:42   Dg Hand Complete Left  Result Date: 10/17/2018 CLINICAL DATA:  Laceration third digit. EXAM: LEFT HAND - COMPLETE 3+ VIEW COMPARISON:  No recent prior. FINDINGS: Diffuse severe osteopenia. Diffuse severe degenerative change. Associated erosive arthropathy cannot be excluded. Similar findings noted on prior exam. Arthropathic changes are most prominent about the radiocarpal, intercarpal, carpometacarpal, and metacarpophalangeal joints. No evidence of fracture or dislocation. Radiopaque foreign body noted in the interspace between the third and fourth digits in unchanged position. IMPRESSION: Diffuse severe osteopenia. Diffuse severe degenerative change. Associated erosive arthropathy can not be excluded. Similar findings noted on prior exam. No acute bony abnormality. 2. Radiopaque foreign body noted in the webspace between the third and fourth digits. This is unchanged in appearance. Electronically Signed   By: Marcello Moores  Register   On: 10/17/2018 13:45   Dg Hand Complete Left  Result Date: 10/17/2018 CLINICAL DATA:  Patient fell, lacerating both hands with glass. EXAM: LEFT HAND - COMPLETE 3+ VIEW COMPARISON:  Radiographs 09/28/2006. FINDINGS: The bones are diffusely demineralized. There is no evidence of acute fracture or dislocation. There is a large radiopaque foreign body consistent with a glass fragment in the ulnar aspect of the long finger proximally. No other foreign bodies are identified. There are underlying advanced arthropathic changes throughout the carpal bones, metacarpal phalangeal joints and distal interphalangeal joint of the index finger which have significantly progressed from 2008. There is  associated joint space narrowing, osteophyte formation and erosive change. IMPRESSION: 1. Glass fragment within the soft tissues of the proximal long finger near the 3rd webspace. 2. No acute osseous findings. 3. Progressive advanced arthropathic changes consistent with rheumatoid arthritis variant. Electronically Signed   By: Richardean Sale M.D.   On: 10/17/2018 11:09   Dg Hand Complete Right  Result Date: 10/17/2018 CLINICAL DATA:  Patient fell, lacerating both hands with glass. EXAM: RIGHT HAND - COMPLETE 3+ VIEW COMPARISON:  Radiographs 09/25/2016-no report. FINDINGS: The bones appear mildly demineralized. There is no evidence of acute fracture or dislocation. Advanced arthropathic changes are again noted throughout the wrist, metacarpal phalangeal joints and the distal interphalangeal joint of the index finger. There is associated joint space narrowing, osteophyte formation and erosive change, similar to previous study. There is new soft tissue irregularity involving the 4th and 5th digits consistent with lacerations. Tiny radiopaque foreign bodies dorsal to the 4th middle phalanx and dorsal to the 3rd proximal interphalangeal joint are difficult to exclude. IMPRESSION: 1. Soft tissue lacerations in the fingers with possible small foreign bodies in the long and ring fingers. 2. No evidence of acute fracture or dislocation. 3. Underlying advanced arthropathic changes as before, likely rheumatoid arthritis variant. Electronically Signed   By: Richardean Sale  M.D.   On: 10/17/2018 11:06   Ct Angio Chest/abd/pel For Dissection W And/or Wo Contrast  Result Date: 10/17/2018 CLINICAL DATA:  Chest pain EXAM: CT ANGIOGRAPHY CHEST, ABDOMEN AND PELVIS TECHNIQUE: Multidetector CT imaging through the chest, abdomen and pelvis was performed using the standard protocol during bolus administration of intravenous contrast. Multiplanar reconstructed images and MIPs were obtained and reviewed to evaluate the vascular  anatomy. CONTRAST:  57mL OMNIPAQUE IOHEXOL 350 MG/ML SOLN COMPARISON:  CT chest, 10/05/2018, 09/22/2017 FINDINGS: CTA CHEST FINDINGS Cardiovascular: Preferential opacification of the thoracic aorta. Unchanged postoperative appearance of the aorta, with aortic valve replacement and graft repair of the tubular ascending aorta, including reimplantation of the innominate and left common carotid artery origins. No evidence of valve dehiscence on this non motion gated examination. Unchanged aneurysmal dilatation of the native descending thoracic aorta, maximum caliber 4.2 x 4.0 cm. Mild cardiomegaly. No pericardial effusion. Mediastinum/Nodes: No enlarged mediastinal, hilar, or axillary lymph nodes. Thyroid gland, trachea, and esophagus demonstrate no significant findings. Lungs/Pleura: Bibasilar atelectasis or scarring with scattered, nonspecific ground-glass opacities in the left lung base, unchanged from prior. No pleural effusion or pneumothorax. Musculoskeletal: No chest wall abnormality. No acute or significant osseous findings. Review of the MIP images confirms the above findings. CTA ABDOMEN AND PELVIS FINDINGS VASCULAR Tortuous abdominal aorta measuring up to 2.5 x 2.4 cm in the distal aorta. Standard branching pattern of the abdominal aorta. Mixed calcific atherosclerosis without evidence of significant branch vessel stenosis. Review of the MIP images confirms the above findings. NON-VASCULAR Hepatobiliary: No focal liver abnormality is seen. Status post cholecystectomy. No biliary dilatation. Pancreas: Unremarkable. No pancreatic ductal dilatation or surrounding inflammatory changes. Spleen: Normal in size without significant abnormality. Adrenals/Urinary Tract: Adrenal glands are unremarkable. Kidneys are normal, without renal calculi, solid lesion, or hydronephrosis. Bladder is unremarkable. Stomach/Bowel: Stomach is within normal limits. No evidence of bowel wall thickening, distention, or inflammatory  changes. Occasional sigmoid diverticula. Vascular/Lymphatic: No significant vascular findings are present. No enlarged abdominal or pelvic lymph nodes. Reproductive: Status post hysterectomy. Other: No abdominal wall hernia or abnormality. No abdominopelvic ascites. Musculoskeletal: No acute or significant osseous findings. Unchanged, high-grade wedge deformity of the L1 vertebral body. Review of the MIP images confirms the above findings. IMPRESSION: 1. Unchanged postoperative appearance of the aorta, with aortic valve replacement and graft repair of the tubular ascending aorta, including reimplantation of the innominate and left common carotid artery origins. Unchanged aneurysmal dilatation of the native descending thoracic aorta, maximum caliber 4.2 x 4.0 cm. 2. Tortuous abdominal aorta measuring up to 2.5 x 2.4 cm in the distal aorta. Standard branching pattern of the abdominal aorta. Mixed calcific atherosclerosis without evidence of significant branch vessel stenosis. 3. No evidence of dissection, penetrating ulcer, or other acute aortic syndrome. 4. Bibasilar atelectasis or scarring with scattered, nonspecific ground-glass opacities in the left lung base, unchanged from prior examinations. 5.  Other chronic, incidental, and postoperative findings as above. Electronically Signed   By: Eddie Candle M.D.   On: 10/17/2018 15:36    Procedures .Marland KitchenLaceration Repair  Date/Time: 10/17/2018 5:19 PM Performed by: Deliah Boston, PA-C Authorized by: Deliah Boston, PA-C   Consent:    Consent obtained:  Verbal   Consent given by:  Patient   Risks discussed:  Infection, need for additional repair, nerve damage, pain, poor cosmetic result, poor wound healing, retained foreign body, tendon damage and vascular damage Anesthesia (see MAR for exact dosages):    Anesthesia method:  Local infiltration  Local anesthetic:  Lidocaine 1% w/o epi Laceration details:    Location:  Finger   Finger location:  L  long finger   Length (cm):  2.5   Depth (mm):  4 Repair type:    Repair type:  Intermediate Pre-procedure details:    Preparation:  Patient was prepped and draped in usual sterile fashion and imaging obtained to evaluate for foreign bodies Exploration:    Hemostasis achieved with:  Direct pressure   Wound exploration: wound explored through full range of motion and entire depth of wound probed and visualized     Wound extent: foreign bodies/material     Wound extent: no muscle damage noted, no nerve damage noted, no tendon damage noted, no underlying fracture noted and no vascular damage noted   Treatment:    Area cleansed with:  Betadine, saline and Shur-Clens   Amount of cleaning:  Extensive   Irrigation solution:  Sterile saline   Irrigation method:  Pressure wash   Visualized foreign bodies/material removed: yes   Skin repair:    Repair method:  Sutures   Suture size:  4-0   Suture material:  Prolene   Suture technique:  Simple interrupted   Number of sutures:  5 Approximation:    Approximation:  Close Post-procedure details:    Dressing:  Antibiotic ointment, non-adherent dressing and sterile dressing   Patient tolerance of procedure:  Tolerated well, no immediate complications Comments:     Initial irrigation and debridement produced large amount of blood clot, initially thought to have removed foreign body after cleaning, repeat x-ray still showed foreign body present.  This led to further debridement and the large radiopaque piece of glass was removed from the wound successfully.  Wound was then again irrigated and sutured.  Capillary refill sensation intact to all 5 fingers after repair. Marland Kitchen.Laceration Repair  Date/Time: 10/17/2018 5:21 PM Performed by: Deliah Boston, PA-C Authorized by: Deliah Boston, PA-C   Consent:    Consent obtained:  Verbal   Consent given by:  Patient   Risks discussed:  Pain, infection, tendon damage, vascular damage, retained foreign  body, poor cosmetic result, poor wound healing, nerve damage and need for additional repair Anesthesia (see MAR for exact dosages):    Anesthesia method:  Local infiltration   Local anesthetic:  Lidocaine 1% w/o epi Laceration details:    Location:  Finger   Finger location:  L long finger   Length (cm):  1   Depth (mm):  3 Pre-procedure details:    Preparation:  Patient was prepped and draped in usual sterile fashion and imaging obtained to evaluate for foreign bodies Exploration:    Wound exploration: wound explored through full range of motion and entire depth of wound probed and visualized     Wound extent: no foreign bodies/material noted, no muscle damage noted, no nerve damage noted, no tendon damage noted, no underlying fracture noted and no vascular damage noted   Treatment:    Area cleansed with:  Saline, Betadine and Shur-Clens   Amount of cleaning:  Extensive Skin repair:    Repair method:  Sutures   Suture size:  4-0   Suture material:  Prolene   Suture technique:  Simple interrupted   Number of sutures:  1 Approximation:    Approximation:  Close Post-procedure details:    Dressing:  Antibiotic ointment, non-adherent dressing and sterile dressing   (including critical care time)  Medications Ordered in ED Medications  fluconazole (DIFLUCAN) tablet 50 mg (  has no administration in time range)  lidocaine (PF) (XYLOCAINE) 1 % injection 30 mL (30 mLs Infiltration Given by Other 10/17/18 1253)  HYDROmorphone (DILAUDID) injection 0.5 mg (0.5 mg Intravenous Given 10/17/18 1158)  Tdap (BOOSTRIX) injection 0.5 mL (0.5 mLs Intramuscular Given 10/17/18 1717)  sodium chloride 0.9 % bolus 500 mL (0 mLs Intravenous Stopped 10/17/18 1654)  iohexol (OMNIPAQUE) 350 MG/ML injection 100 mL (75 mLs Intravenous Contrast Given 10/17/18 1456)     Initial Impression / Assessment and Plan / ED Course  I have reviewed the triage vital signs and the nursing notes.  Pertinent labs & imaging  results that were available during my care of the patient were reviewed by me and considered in my medical decision making (see chart for details).    CT head/cervical spine:  IMPRESSION:  1. No acute intracranial abnormality. No intracranial mass,  hemorrhage or edema. No skull fracture.  2. No fracture or acute subluxation within the cervical spine.  Chronic compression fracture deformity of the T3 vertebral body.  Degenerative changes throughout the cervical spine, moderate in  degree but no more than mild central canal stenosis at any level.    Aortic Atherosclerosis (ICD10-I70.0).   DG Left hand:  IMPRESSION:  1. Glass fragment within the soft tissues of the proximal long  finger near the 3rd webspace.  2. No acute osseous findings.  3. Progressive advanced arthropathic changes consistent with  rheumatoid arthritis variant.   DG right hand:  IMPRESSION:  1. Soft tissue lacerations in the fingers with possible small  foreign bodies in the long and ring fingers.  2. No evidence of acute fracture or dislocation.  3. Underlying advanced arthropathic changes as before, likely  rheumatoid arthritis variant.  - Patient with multiple allergies to pain medications including morphine, she is requesting IV Dilaudid for pain control I addressed this with the patient and she reports that it is the only medication that controls her pain and she does not have any reaction to this despite having a reaction to morphine.  She has been given 0.5 mg Dilaudid without reaction. - Laceration was repaired as above, second x-ray of left hand was obtained after elective initially removed foreign body however after repeat x-ray I was able to locate the large piece of glass and remove it from her finger, repeat irrigation was performed and suture repair as above.  Patient neurovascular intact with sensation, capillary refill and range of motion to left hand post procedure.  As to patient's right hand  injury unfortunately the large skin avulsions and abrasions to dorsal proximal interphalangeal joints are not amendable to be repaired in the ED. Patient was seen and evaluated by Dr. Roderic Palau who agrees, will need delayed wound closure, cleaned thoroughly here in the ED, bandage, antibiotics and hand follow-up.  Sensation, capillary refill intact to all 5 fingers, flexion extension intact with minimal increase in pain.  Tdap updated here in the emergency department. - Just after suture repair patient endorses a brief episode less than a minute of left upper abdominal pain and sensation radiating to into chest.  Per patient's own words this is her pancreas pain radiating into her esophagus.  She reports that this feels like her typical indigestion however nursing staff noted patient's blood pressure to be systolic 88.  Fluid bolus ordered.  Patient does have history of thoracic aneurysm.  CBC nonacute CMP appears baseline Lipase within normal limits Troponin within normal limits  CT angios chest/abdomen/pelvis:  IMPRESSION:  1. Unchanged  postoperative appearance of the aorta, with aortic  valve replacement and graft repair of the tubular ascending aorta,  including reimplantation of the innominate and left common carotid  artery origins. Unchanged aneurysmal dilatation of the native  descending thoracic aorta, maximum caliber 4.2 x 4.0 cm.    2. Tortuous abdominal aorta measuring up to 2.5 x 2.4 cm in the  distal aorta. Standard branching pattern of the abdominal aorta.  Mixed calcific atherosclerosis without evidence of significant  branch vessel stenosis.    3. No evidence of dissection, penetrating ulcer, or other acute  aortic syndrome.    4. Bibasilar atelectasis or scarring with scattered, nonspecific  ground-glass opacities in the left lung base, unchanged from prior  examinations.    5. Other chronic, incidental, and postoperative findings as above.   Orthostatics were  obtained and are negative.  Patient has received 500 mL fluid bolus in ED. - Patient reassessed resting comfortably no acute distress states that she has not had a recurrence of pain while here in the ED, she reports that she normally treats this pain with a soda at home.  Suspect patient with possible indigestion versus vasovagal event here in the ED.  Patient seen and evaluated by Dr. Roderic Palau who advises no further work-up at this time, patient is to be discharged with doxycycline and hand specialist referral.  At this time there does not appear to be any evidence of an acute emergency medical condition and the patient appears stable for discharge with appropriate outpatient follow up. Diagnosis was discussed with patient who verbalizes understanding of care plan and is agreeable to discharge. I have discussed return precautions with patient who verbalizes understanding of return precautions. Patient encouraged to follow-up with their PCP and hand. All questions answered.  Patient has been discharged in good condition. - Addendum patient is requesting Diflucan for oral yeast infection recurs every time she takes antibiotics.  Will give Diflucan 50 mg per Cockcroft-Gault equation based on creatinine clearance.   Note: Portions of this report may have been transcribed using voice recognition software. Every effort was made to ensure accuracy; however, inadvertent computerized transcription errors may still be present. Final Clinical Impressions(s) / ED Diagnoses   Final diagnoses:  Fall, initial encounter  Laceration of right hand, foreign body presence unspecified, initial encounter  Laceration of left hand with foreign body, initial encounter    ED Discharge Orders         Ordered    doxycycline (VIBRAMYCIN) 100 MG capsule  2 times daily     10/17/18 1710    fluconazole (DIFLUCAN) 50 MG tablet  Daily     10/17/18 1746           Deliah Boston, PA-C 10/17/18 1732    Deliah Boston, PA-C 10/17/18 1748    Deliah Boston, PA-C 10/17/18 1756    Milton Ferguson, MD 10/17/18 2058

## 2018-10-17 NOTE — ED Triage Notes (Signed)
Patient dropped off by her son.   Was using a stool to change a light bulb in a lamp and fell over off stool.   C/O Head pain (Patient hit her head on a chair), right hand pain and right leg pain.     A/Ox4 Ambulatory in triage Patient uses a cane at home.    Patient states she takes Asprin everyday.   Hx. aorta valve replaced.

## 2018-10-17 NOTE — ED Notes (Signed)
Patient transported to CT 

## 2018-10-17 NOTE — ED Notes (Signed)
This Probation officer called into pt's room related to pt complaint of chest pain. As capturing EKG pt verbalizing pain just starting a few minutes ago and subsiding while EKG obtained. Pt verbalizes "pain from pancreas to chest [points at left chest]." VS and EKG obtained. Verbal orders for EKG and half liter bolus of NS obtained. Pt alert.

## 2018-10-17 NOTE — Discharge Instructions (Addendum)
You have been diagnosed today with fall resulting in head injury and laceration of bilateral hands.  At this time there does not appear to be the presence of an emergent medical condition, however there is always the potential for conditions to change. Please read and follow the below instructions.  Please return to the Emergency Department immediately for any new or worsening symptoms. Please be sure to follow up with your Primary Care Provider within one week regarding your visit today; please call their office to schedule an appointment even if you are feeling better for a follow-up visit. Please take the antibiotic doxycycline as prescribed to help avoid infection of your hand lacerations.  As we discussed there is not enough skin on the fingers of your right hand to repair with a suture these will have to close over time.  Please keep wounds covered with clean sterile dressing and antibiotic ointment, call the hand surgeon's office tomorrow morning to schedule a follow-up appointment as these will need to be monitored closely to ensure proper healing.  Your 6 stitches will need to be taken out by the hand surgeon in 7 days.  You may also return to the emergency department for suture removal if necessary.   Get help right away if: You develop severe swelling around your wound. You have pus or a bad smell coming from your wound. Your pain suddenly gets worse and is severe. You develop painful lumps near your wound or anywhere on your body. You have a red streak going away from your wound. The wound is on your hand or foot and: You cannot properly move a finger or toe. Your fingers or toes look pale or bluish. You have numbness that is spreading down your hand, foot, fingers, or toes. Get help right away if: You have: A very bad (severe) headache that is not helped by medicine. Trouble walking or weakness in your arms and legs. Clear or bloody fluid coming from your nose or ears. Changes in  your seeing (vision). Jerky movements that you cannot control (seizure). You throw up (vomit). Your symptoms get worse. You lose balance. Your speech is slurred. You pass out. You are sleepier and have trouble staying awake. The black centers of your eyes (pupils) change in size.  Please read the additional information packets attached to your discharge summary.  Do not take your medicine if  develop an itchy rash, swelling in your mouth or lips, or difficulty breathing; call 911 and seek immediate emergency medical attention if this occurs.

## 2018-10-17 NOTE — ED Notes (Signed)
Pt's hands are now soaking in sterile water and betadine.  Pt upset that her hand are not numbed.  It was explained to pt that we need to see the wounds before we start numbing them.

## 2018-10-17 NOTE — ED Notes (Signed)
Dressed pt's hands with a wet to dry dressing.

## 2018-10-17 NOTE — ED Notes (Signed)
Bed: PN55 Expected date:  Expected time:  Means of arrival:  Comments: Lorin Mercy

## 2018-10-18 ENCOUNTER — Telehealth: Payer: Self-pay | Admitting: Pharmacist

## 2018-10-18 NOTE — Telephone Encounter (Signed)
The only option would be low-dose prednisone at 5 mg p.o. daily or Medrol 4 mg p.o. daily.  Please discussed with patient if she is interested we can call in the prescription.

## 2018-10-18 NOTE — Telephone Encounter (Signed)
Patient called and left a voicemail notifying our office of an accident.  She had a fall and laceration to her hands and she was seen in the ED.  Informed us that labs were drawn and wanted to know if those were the labs needed for her Enbrel refill.    Returned patient's call.  Informed patient that the labs we needed were drawn at the hospital.  Unfortunately due to her current wounds and being on antibiotics instructed her to hold Enbrel.  Patient wanted alternatives were available for her pain as she is currently experiencing hand pain (prior to fall) and shoulder/hip pain.  She states that she is unable to tolerate steroids and that Tylenol is ineffective.  States that Aleve works well for her but she has not taken in over a year.  Advised patient that Aleve would not be appropriate due to her decreased kidney function.  She is to follow up with hand specialist next week.  Advised that we will need clearance from hand specialist to resume Enbrel.  Plan is to hold Enbrel and to discuss with Dr. Estanislado Pandy about alternative options for pain.  Patient verbalized understanding and will call tomorrow to review next steps.  All questions encouraged and answered.  Instructed patient to call with any further questions or concerns.  Mariella Saa, PharmD, [facility] Rheumatology Clinical Pharmacist  10/18/2018 4:42 PM

## 2018-10-19 ENCOUNTER — Telehealth: Payer: Self-pay

## 2018-10-19 NOTE — Telephone Encounter (Signed)
Patient returned call.  Advised patient that we had limited options to help with her pain and offered low-dose prednisone as an option.  Patient is still hesitant to take a steroid as she had a friend who is on prednisone for many years and states that it " ate his insides".  Counseled the prednisone does have side effects when taken long-term but this would be temporary until she could resume Enbrel.  Counseled that all medications have risk and benefits and will do our best to manage those appropriately with routine lab monitoring and follow-up.  Patient verbalized understanding.  She declined prednisone or Medrol at this time.  Instructed patient to call us if she changes her mind.  Instructed patient to follow-up with our office which she sees a hand specialist to update [place] on her situation and that we would need clearance prior to her resuming Enbrel.  Patient verbalized understanding.  All questions encouraged and answered.  Instructed patient to call with any further questions or concerns.  Mariella Saa, PharmD, [facility] Rheumatology Clinical Pharmacist  10/19/2018 10:22 AM

## 2018-10-19 NOTE — Telephone Encounter (Signed)
Copied from [place] [id number]. Topic: General - Other >> Oct 19, 2018  7:04 AM Scherrie Gerlach wrote: Reason for CRM: pt calling.  this am to follow up on her visit to the ED. Pt states both hands are messed up, knuckles are a out. Both hands bandaged   Pt states bandages are to be changed 1-2 X a day. Must be wet 2 dry.  Also pt needs sterile water, need to get from [facility], but pt needs a script for this Their phone number is [phone].  Pt also would like to inquire about home health, as she cannot use either hands and states she cannot even take her meds right now.  Pt needs to follow up with Dr Alain Marion.  She states she can arrange to come in if he needs her to, or virtial ok. Please call asap. thanks >> Oct 19, 2018  8:11 AM Peace, Tammy L wrote: Patient scheduled for 6/18 w/ plot

## 2018-10-19 NOTE — Telephone Encounter (Signed)
LVM to discuss

## 2018-10-20 ENCOUNTER — Ambulatory Visit (INDEPENDENT_AMBULATORY_CARE_PROVIDER_SITE_OTHER): Payer: PPO | Admitting: Internal Medicine

## 2018-10-20 ENCOUNTER — Encounter: Payer: Self-pay | Admitting: Internal Medicine

## 2018-10-20 DIAGNOSIS — Z95828 Presence of other vascular implants and grafts: Secondary | ICD-10-CM | POA: Diagnosis not present

## 2018-10-20 DIAGNOSIS — S61419D Laceration without foreign body of unspecified hand, subsequent encounter: Secondary | ICD-10-CM

## 2018-10-20 DIAGNOSIS — M0609 Rheumatoid arthritis without rheumatoid factor, multiple sites: Secondary | ICD-10-CM

## 2018-10-20 DIAGNOSIS — K219 Gastro-esophageal reflux disease without esophagitis: Secondary | ICD-10-CM | POA: Diagnosis not present

## 2018-10-20 DIAGNOSIS — S61419A Laceration without foreign body of unspecified hand, initial encounter: Secondary | ICD-10-CM | POA: Insufficient documentation

## 2018-10-20 MED ORDER — "GAUZE DRESSING 4""X4"" PADS": MEDICATED_PAD | 1 refills | Status: DC

## 2018-10-20 MED ORDER — OXYCODONE-ACETAMINOPHEN 5-325 MG PO TABS: 1.0000 | ORAL_TABLET | Freq: Four times a day (QID) | ORAL | 0 refills | Status: DC | PRN

## 2018-10-20 MED ORDER — WATER FOR IRRIGATION, STERILE IR SOLN: 1000.0000 mL | Freq: Every day | 3 refills | Status: DC | PRN

## 2018-10-20 MED ORDER — MUPIROCIN 2 % EX OINT: TOPICAL_OINTMENT | CUTANEOUS | 1 refills | Status: DC

## 2018-10-20 MED ORDER — GAUZE BANDAGE MISC: 3 refills | Status: DC

## 2018-10-20 MED ORDER — FLUCONAZOLE 100 MG PO TABS: 200.0000 mg | ORAL_TABLET | Freq: Every day | ORAL | 1 refills | Status: DC

## 2018-10-20 NOTE — Assessment & Plan Note (Signed)
Enbrel is on hold due to lacerations.  Percocet PRN

## 2018-10-20 NOTE — Assessment & Plan Note (Signed)
Follow-up with Dr. Julien Nordmann

## 2018-10-20 NOTE — Assessment & Plan Note (Signed)
Protonix.  ?

## 2018-10-20 NOTE — Assessment & Plan Note (Addendum)
s/p ER visit on Monday for sustained lacerations of both hands.  She was trying to change a light bulb with her table lamp the table lamp with a glass shade fell and shattered.  Patient lost balance, fell and cut her hands over the glass pieces scattered on the floor.  She received 1 treatment and staging of the ER.  She has been doing wet-to-dry dressings and treating her wounds with Neosporin.  She was instructed to do wet-to-dry dressings changes with sterile water.  She was given antibiotics to take by mouth.  She started to develop a thrush that normally becomes a severe infection for her.  She needs Diflucan 200 mg daily for 2 weeks at least for this kind of matter.  She has not been able to take care of herself or change dressings  Home care referral Bactroban ointment  Percocet PRN

## 2018-10-20 NOTE — Progress Notes (Signed)
Virtual Visit via Video Note  I connected with Diane Abbott on 10/20/18 at 11:20 AM EDT by a video enabled telemedicine application and verified that I am speaking with the correct person using two identifiers.   I discussed the limitations of evaluation and management by telemedicine and the availability of in person appointments. The patient expressed understanding and agreed to proceed.  History of Present Illness: We need to follow-up on her ER visit on Monday for sustained lacerations of both hands.  She was trying to change a light bulb with her table lamp the table lamp with a glass shade fell and shattered.  Patient lost balance, fell and cut her hands over the glass pieces scattered on the floor.  She received 1 treatment and staging of the ER.  She has been doing wet-to-dry dressings and treating her wounds with Neosporin.  She was instructed to do wet-to-dry dressings changes with sterile water.  She was given antibiotics to take by mouth.  She started to develop a thrush that normally becomes a severe infection for her.  She needs Diflucan 200 mg daily for 2 weeks at least for this kind of matter.  She has not been able to take care of herself or change dressings  There has been no runny nose,  chest pain, abdominal pain, diarrhea, constipation, , skin rashes.   Her prolonged arthritis treatment will be on hold due to fresh wounds.  She is something for pain. Observations/Objective: The patient appears to be in no acute distress, looks concerned.  Wounds on both hands are dressed  Assessment and Plan:  See my Assessment and Plan. Follow Up Instructions:    I discussed the assessment and treatment plan with the patient. The patient was provided an opportunity to ask questions and all were answered. The patient agreed with the plan and demonstrated an understanding of the instructions.   The patient was advised to call back or seek an in-person evaluation if the symptoms worsen or  if the condition fails to improve as anticipated.  I provided face-to-face time during this encounter. We were at different locations.   Walker Kehr, MD

## 2018-10-20 NOTE — Assessment & Plan Note (Signed)
No chest pain

## 2018-10-25 DIAGNOSIS — S61223A Laceration with foreign body of left middle finger without damage to nail, initial encounter: Secondary | ICD-10-CM | POA: Insufficient documentation

## 2018-11-01 DIAGNOSIS — S61223A Laceration with foreign body of left middle finger without damage to nail, initial encounter: Secondary | ICD-10-CM | POA: Diagnosis not present

## 2018-11-08 ENCOUNTER — Ambulatory Visit (INDEPENDENT_AMBULATORY_CARE_PROVIDER_SITE_OTHER): Payer: PPO | Admitting: Internal Medicine

## 2018-11-08 ENCOUNTER — Encounter: Payer: Self-pay | Admitting: Internal Medicine

## 2018-11-08 DIAGNOSIS — S61419D Laceration without foreign body of unspecified hand, subsequent encounter: Secondary | ICD-10-CM

## 2018-11-08 DIAGNOSIS — M0609 Rheumatoid arthritis without rheumatoid factor, multiple sites: Secondary | ICD-10-CM

## 2018-11-08 DIAGNOSIS — I712 Thoracic aortic aneurysm, without rupture, unspecified: Secondary | ICD-10-CM

## 2018-11-08 MED ORDER — OXYCODONE HCL 5 MG PO TABS: 5.0000 mg | ORAL_TABLET | Freq: Four times a day (QID) | ORAL | 0 refills | Status: DC | PRN

## 2018-11-08 NOTE — Patient Instructions (Signed)
If you have medicare related insurance (such as traditional Medicare, Blue Cross Medicare, United HealthCare Medicare, or similar), Please make an appointment at the scheduling desk with Jill, the Wellness Health Coach, for your Wellness visit in this office, which is a benefit with your insurance.  

## 2018-11-08 NOTE — Assessment & Plan Note (Signed)
Healing lacerations, discussed

## 2018-11-08 NOTE — Progress Notes (Signed)
Virtual Visit via Video Note  I connected with [name] on 11/08/18 at  4:00 PM EDT by a video enabled telemedicine application and verified that I am speaking with the correct person using two identifiers.   I discussed the limitations of evaluation and management by telemedicine and the availability of in person appointments. The patient expressed understanding and agreed to proceed.  History of Present Illness: We need to follow-up on a fall on 6/30.  Diane Abbott is complaining of severe pain due to rheumatoid arthritis.  She is not able to receive Enbrel due to lacerations.  Percocet made her sick.  She was able to take oxycodone without acetaminophen in the past.  She had a CT angiogram of the chest at her emergency room visit which did not reveal any acute pathology.  There has been no runny nose, cough, shortness of breath, abdominal pain, diarrhea, constipation, skin rashes.   Observations/Objective: The patient appears to be in no acute distress.  Assessment and Plan:  See my Assessment and Plan. Follow Up Instructions:    I discussed the assessment and treatment plan with the patient. The patient was provided an opportunity to ask questions and all were answered. The patient agreed with the plan and demonstrated an understanding of the instructions.   The patient was advised to call back or seek an in-person evaluation if the symptoms worsen or if the condition fails to improve as anticipated.  I provided face-to-face time during this encounter. We were at different locations.   Walker Kehr, MD

## 2018-11-08 NOTE — Assessment & Plan Note (Signed)
Her recent chest CT angiogram report was reviewed

## 2018-11-08 NOTE — Assessment & Plan Note (Signed)
Diane Abbott is complaining of severe pain due to rheumatoid arthritis.  She is not able to receive Enbrel due to lacerations.  Percocet made her sick.  A prescription for oxycodone 5 mg provided.  Risks discussed

## 2018-11-11 ENCOUNTER — Telehealth: Payer: Self-pay | Admitting: Internal Medicine

## 2018-11-11 NOTE — Telephone Encounter (Signed)
Please advise 

## 2018-11-11 NOTE — Telephone Encounter (Signed)
Patient said that Dr Alain Marion reviewed her labs from the hospital and mentioned that he wanted her to have them done again. Please advise on when those should be done. She is scheduled to follow up in September.

## 2018-11-11 NOTE — Telephone Encounter (Signed)
Patient's After Visit Summary requested 2 month follow up. Patient is requesting labs. Are these needing to be ordered? Please advise. Thank you .

## 2018-11-13 NOTE — Telephone Encounter (Signed)
Labs as ordered on 3/13 Thx

## 2018-11-15 NOTE — Telephone Encounter (Signed)
LM notifying pt

## 2018-11-16 ENCOUNTER — Telehealth: Payer: Self-pay | Admitting: Rheumatology

## 2018-11-16 DIAGNOSIS — M0609 Rheumatoid arthritis without rheumatoid factor, multiple sites: Secondary | ICD-10-CM

## 2018-11-16 DIAGNOSIS — Z9225 Personal history of immunosupression therapy: Secondary | ICD-10-CM

## 2018-11-16 MED ORDER — ENBREL SURECLICK 50 MG/ML ~~LOC~~ SOAJ: 50.0000 mg | SUBCUTANEOUS | 0 refills | Status: DC

## 2018-11-16 NOTE — Telephone Encounter (Signed)
Last Visit: 09/30/18  Next Visit: due august 2020. Message sent to the front to schedule.  Labs: 10/17/18 WBC 3.4, RBC 3.75 Hgb 11.6, Platelets 96 Neutro Abs 1.2 Potassium 3.2, Glucose 100 BUN 27 Creat 1.34 GFR 37 Total Protein 6.4 TB Gold: 11/15/17 Neg   Okay to refill Enbrel?

## 2018-11-16 NOTE — Telephone Encounter (Signed)
[employer] calling for new rx for patient's Embrel. Patient gets medication free from them. Please fax rx to [phone]

## 2018-11-16 NOTE — Telephone Encounter (Signed)
Please schedule patient for a follow up visit. Patient due August 2020. Thanks!

## 2018-11-16 NOTE — Telephone Encounter (Signed)
Left message to advise patient she is due to update TB Gold.

## 2018-11-16 NOTE — Telephone Encounter (Signed)
Please advise patient to update TB gold.

## 2018-11-21 ENCOUNTER — Telehealth: Payer: Self-pay | Admitting: Rheumatology

## 2018-11-21 NOTE — Telephone Encounter (Signed)
Advised patient she is due to update TB Gold, patient states she will have this updated on 01/10/2019 at Dr. Judeen Hammans office. Advised patient she really needs it drawn before then since it is a yearly test and there are risks involved with pushing it out. Patient verbalized understanding and decided to have it drawn on 01/10/2019.

## 2018-11-21 NOTE — Telephone Encounter (Signed)
Patient left a voicemail stating she was returning Andrea's call regarding labwork that is due.  Patient is requesting that the labwork orders be sent to her PCP Dr. Alain Marion and she will have them done at her next appointment with on 01/10/19.

## 2018-11-22 ENCOUNTER — Telehealth: Payer: Self-pay | Admitting: Emergency Medicine

## 2018-11-22 DIAGNOSIS — Z9225 Personal history of immunosupression therapy: Secondary | ICD-10-CM

## 2018-11-22 NOTE — Telephone Encounter (Signed)
Pt called stating that she had blood work entered by [facility] and also needs a TB Gold per [employer]. Ordered reentered and will send results to Dr Estanislado Pandy when resulted.

## 2018-11-24 ENCOUNTER — Telehealth: Payer: Self-pay | Admitting: *Deleted

## 2018-11-24 ENCOUNTER — Other Ambulatory Visit (INDEPENDENT_AMBULATORY_CARE_PROVIDER_SITE_OTHER): Payer: PPO

## 2018-11-24 DIAGNOSIS — R1084 Generalized abdominal pain: Secondary | ICD-10-CM | POA: Diagnosis not present

## 2018-11-24 DIAGNOSIS — Z9225 Personal history of immunosupression therapy: Secondary | ICD-10-CM

## 2018-11-24 LAB — MAGNESIUM: Magnesium: 2.1 mg/dL (ref 1.5–2.5)

## 2018-11-24 LAB — CBC WITH DIFFERENTIAL/PLATELET
Basophils Absolute: 0.1 10*3/uL (ref 0.0–0.1)
Basophils Relative: 1.3 % (ref 0.0–3.0)
Eosinophils Absolute: 0.2 10*3/uL (ref 0.0–0.7)
Eosinophils Relative: 5.7 % — ABNORMAL HIGH (ref 0.0–5.0)
HCT: 36.9 % (ref 36.0–46.0)
Hemoglobin: 12.6 g/dL (ref 12.0–15.0)
Lymphocytes Relative: 35.9 % (ref 12.0–46.0)
Lymphs Abs: 1.4 10*3/uL (ref 0.7–4.0)
MCHC: 34.1 g/dL (ref 30.0–36.0)
MCV: 94.1 fl (ref 78.0–100.0)
Monocytes Absolute: 0.4 10*3/uL (ref 0.1–1.0)
Monocytes Relative: 9.5 % (ref 3.0–12.0)
Neutro Abs: 1.8 10*3/uL (ref 1.4–7.7)
Neutrophils Relative %: 47.6 % (ref 43.0–77.0)
Platelets: 146 10*3/uL — ABNORMAL LOW (ref 150.0–400.0)
RBC: 3.92 Mil/uL (ref 3.87–5.11)
RDW: 13.7 % (ref 11.5–15.5)
WBC: 3.8 10*3/uL — ABNORMAL LOW (ref 4.0–10.5)

## 2018-11-24 LAB — BASIC METABOLIC PANEL
BUN: 46 mg/dL — ABNORMAL HIGH (ref 6–23)
CO2: 28 mEq/L (ref 19–32)
Calcium: 9.5 mg/dL (ref 8.4–10.5)
Chloride: 98 mEq/L (ref 96–112)
Creatinine, Ser: 2.28 mg/dL — ABNORMAL HIGH (ref 0.40–1.20)
GFR: 20.48 mL/min — ABNORMAL LOW (ref >60.00)
Glucose, Bld: 113 mg/dL — ABNORMAL HIGH (ref 70–99)
Potassium: 2.8 mEq/L — CL (ref 3.5–5.1)
Sodium: 138 mEq/L (ref 135–145)

## 2018-11-24 LAB — HEPATIC FUNCTION PANEL
ALT: 10 U/L (ref 0–35)
AST: 20 U/L (ref 0–37)
Albumin: 4.3 g/dL (ref 3.5–5.2)
Alkaline Phosphatase: 67 U/L (ref 39–117)
Bilirubin, Direct: 0.1 mg/dL (ref 0.0–0.3)
Total Bilirubin: 0.7 mg/dL (ref 0.2–1.2)
Total Protein: 7.5 g/dL (ref 6.0–8.3)

## 2018-11-24 LAB — LIPASE: Lipase: 118 U/L — ABNORMAL HIGH (ref 11.0–59.0)

## 2018-11-24 LAB — CK: Total CK: 142 U/L (ref 7–177)

## 2018-11-24 MED ORDER — POTASSIUM CHLORIDE CRYS ER 20 MEQ PO TBCR: 20.0000 meq | EXTENDED_RELEASE_TABLET | Freq: Every day | ORAL | 3 refills | Status: DC

## 2018-11-24 NOTE — Telephone Encounter (Signed)
K dur Rx emailed [facility] in 2-3 weeks Thx

## 2018-11-24 NOTE — Telephone Encounter (Signed)
Patient's Kcl  is critical at 2.8.

## 2018-11-24 NOTE — Telephone Encounter (Signed)
Left message for patient to call back. CRM created 

## 2018-11-26 LAB — QUANTIFERON-TB GOLD PLUS
Mitogen-NIL: 6.06 IU/mL
NIL: 0.02 IU/mL
QuantiFERON-TB Gold Plus: NEGATIVE
TB1-NIL: 0 IU/mL
TB2-NIL: 0 IU/mL

## 2018-12-28 ENCOUNTER — Ambulatory Visit (HOSPITAL_COMMUNITY): Payer: PPO | Attending: Cardiovascular Disease

## 2018-12-28 ENCOUNTER — Other Ambulatory Visit: Payer: Self-pay

## 2018-12-28 DIAGNOSIS — I5032 Chronic diastolic (congestive) heart failure: Secondary | ICD-10-CM | POA: Diagnosis not present

## 2018-12-29 NOTE — Progress Notes (Deleted)
Cardiology Office Note:    Date:  12/29/2018   ID:  Diane Abbott, DOB 1934-10-28, MRN [id number]  PCP:  Cassandria Anger, MD  Cardiologist:  Sinclair Grooms, MD   Referring MD: Cassandria Anger, MD   No chief complaint on file.   History of Present Illness:    Diane Abbott is a 84 y.o. female with a hx of OA, RA, PE/DVT, and status post Bentall bioprosthetic valve conduit and ascending aorta/proximal arch replacement 2014. Also replacement of her aneurysmal aortic arch with aorto-innominate and aorto-left carotid artery bypasson 06/29/2016.  ***  Past Medical History:  Diagnosis Date  . Adrenal insufficiency ([place])   . Anemia   . Anginal pain [facility])    "comes and goes has occured since 2012"  . Blood dyscrasia    "problems with platelets, red cells and white cells being low"  . Bronchitis Jan 2013-March 2013   severe  . Cancer ([place])    skin cancer on face  . Cataracts, bilateral    more in left than right  . Depression   . Esophagitis   . Fatty liver   . Fibromyalgia   . Fibromyalgia   . Foot pain, left 08/26/2016   2/18 1st MTP - gout vs other  . GERD (gastroesophageal reflux disease)   . Heart murmur   . History of blood clots 2008   below knee post trauma. Dilated [facility] no clotting disorder felt to be present.  . History of echocardiogram    Echo 11/16: Mild LVH, EF 60-65%, normal wall motion, grade 1 diastolic dysfunction, bioprosthetic AVR with mean gradient 21 mmHg and no regurgitation, proximal aortic arch aneurysm 49 mm, moderate TR, PASP 31 mmHg  . IBS (irritable bowel syndrome)   . Leaky heart valve   . Osteoarthritis   . Osteoarthritis of left shoulder 12/07/2011  . Osteopenia   . Pulmonary embolism ([place])   . RA (rheumatoid arthritis) ([place])   . Shortness of breath    attributed to aneurysm  . Sliding hiatal hernia   . Urinary leakage    when coughing  . Vertigo    hx of  . Vitamin B 12 deficiency   . Vitamin D deficiency      Past Surgical History:  Procedure Laterality Date  . ABDOMINAL HYSTERECTOMY    . AORTA SURGERY  12/2012   Resection of aneurysm and valve replacement  . APPENDECTOMY  1956  . BENTALL PROCEDURE N/A 09/29/2012   Procedure: BENTALL PROCEDURE;  Surgeon: Gaye Pollack, MD;  [facility];  Service: Open Heart Surgery;  Laterality: N/A;  WITH CIRC ARREST  . BILATERAL OOPHORECTOMY    . BREAST BIOPSY Left   . CARDIAC CATHETERIZATION  09/15/12   no PCI  . CATARACT EXTRACTION Bilateral   . CHOLECYSTECTOMY  2003  . COLONOSCOPY    . COSMETIC SURGERY     Tummy tuck  . INTRAOPERATIVE TRANSESOPHAGEAL ECHOCARDIOGRAM N/A 09/29/2012   Procedure: INTRAOPERATIVE TRANSESOPHAGEAL ECHOCARDIOGRAM;  Surgeon: Gaye Pollack, MD;  Location[facility];  Service: Open Heart Surgery;  Laterality: N/A;  . JOINT REPLACEMENT Bilateral   . REPLACEMENT ASCENDING AORTA N/A 06/29/2016   Procedure: AORTIC ARCH REPLACEMENT WITH CIRC ARREST (N/A) - RIGHT AXILLARY CANNULATION  AORTO-AORTIC 28 HEMASHIELD GRAFT WITH AORTO-INNOMINATE AND AORTO-LEFT CAROTID ANASTAMOSIS;  Surgeon: Gaye Pollack, MD;  Location: [facility] OR;  Service: Open Heart Surgery;  Laterality: N/A;  RIGHT AXILLARY CANNULATION  BILATERAL RADIAL A-LINE  .  SKIN CANCER EXCISION Right    cheek  . TEE WITHOUT CARDIOVERSION N/A 06/29/2016   Procedure: TRANSESOPHAGEAL ECHOCARDIOGRAM (TEE);  Surgeon: Gaye Pollack, MD;  Location: [place];  Service: Open Heart Surgery;  Laterality: N/A;  . TONSILLECTOMY  1941  . TOTAL KNEE ARTHROPLASTY     bilateral  . TOTAL SHOULDER ARTHROPLASTY  12/07/2011   Procedure: TOTAL SHOULDER ARTHROPLASTY;  Surgeon: Johnny Bridge, MD;  Location: [place];  Service: Orthopedics;  Laterality: Left;  left total shoulder artthroplasty  . WRIST SURGERY     bilateral    Current Medications: No outpatient medications have been marked as taking for the 12/30/18 encounter (Appointment) with Belva Crome, MD.     Allergies:   Acetaminophen, Codeine,  Pramipexole dihydrochloride, Rofecoxib, Venlafaxine, Arava [leflunomide], Clonazepam, Diclofenac sodium, Alprazolam, Darvocet [propoxyphene n-acetaminophen], Duloxetine, Gabapentin, Latex, Morphine and related, Nortriptyline hcl, Penicillins, and Prednisone   Social History   Socioeconomic History  . Marital status: Divorced    Spouse name: Not on file  . Number of children: Not on file  . Years of education: Not on file  . Highest education level: Not on file  Occupational History  . Occupation: retired    Fish farm manager: RETIRED  Social Needs  . Financial resource strain: Not on file  . Food insecurity    Worry: Not on file    Inability: Not on file  . Transportation needs    Medical: Not on file    Non-medical: Not on file  Tobacco Use  . Smoking status: Never Smoker  . Smokeless tobacco: Never Used  . Tobacco comment: She describes COPD related to secondhand exposure from her family and husband  Substance and Sexual Activity  . Alcohol use: Yes    Comment: occ   . Drug use: Never  . Sexual activity: Not on file  Lifestyle  . Physical activity    Days per week: Not on file    Minutes per session: Not on file  . Stress: Not on file  Relationships  . Social Herbalist on phone: Not on file    Gets together: Not on file    Attends religious service: Not on file    Active member of club or organization: Not on file    Attends meetings of clubs or organizations: Not on file    Relationship status: Not on file  Other Topics Concern  . Not on file  Social History Narrative  . Not on file     Family History: The patient's family history includes Breast cancer in an other family member; COPD in her father; Clotting disorder in an other family member; Diabetes in her mother; Heart disease in her maternal grandmother; Ovarian cancer in her mother; Pancreatic cancer in her paternal uncle. There is no history of Colon cancer.  ROS:   Please see the history of present  illness.    *** All other systems reviewed and are negative.  EKGs/Labs/Other Studies Reviewed:    The following studies were reviewed today: ***  EKG:  EKG ***  Recent Labs: 04/08/2018: TSH 3.95 11/24/2018: ALT 10; BUN 46; Creatinine, Ser 2.28; Hemoglobin 12.6; Magnesium 2.1; Platelets 146.0; Potassium 2.8; Sodium 138  Recent Lipid Panel No results found for: CHOL, TRIG, HDL, CHOLHDL, VLDL, LDLCALC, LDLDIRECT  Physical Exam:    VS:  There were no vitals taken for this visit.    Wt Readings from Last 3 Encounters:  10/05/18 168 lb 6.4 oz (76.4 kg)  07/15/18 168 lb (76.2 kg)  04/29/18 168 lb 12.8 oz (76.6 kg)     GEN: ***. No acute distress HEENT: Normal NECK: No JVD. LYMPHATICS: No lymphadenopathy CARDIAC: *** RRR without murmur, gallop, or edema. VASCULAR: *** Normal Pulses. No bruits. RESPIRATORY:  Clear to auscultation without rales, wheezing or rhonchi  ABDOMEN: Soft, non-tender, non-distended, No pulsatile mass, MUSCULOSKELETAL: No deformity  SKIN: Warm and dry NEUROLOGIC:  Alert and oriented x 3 PSYCHIATRIC:  Normal affect   ASSESSMENT:    1. S/P AVR (aortic valve replacement)   2. Chronic diastolic heart failure ([place])   3. Thoracic aortic aneurysm without rupture ([facility])   4. Panlobular emphysema ([place])   5. Educated About Covid-19 Virus Infection    PLAN:    In order of problems listed above:  1. ***   Medication Adjustments/Labs and Tests Ordered: Current medicines are reviewed at length with the patient today.  Concerns regarding medicines are outlined above.  No orders of the defined types were placed in this encounter.  No orders of the defined types were placed in this encounter.   There are no Patient Instructions on file for this visit.   Signed, Sinclair Grooms, MD  12/29/2018 9:31 PM    [place]

## 2018-12-30 ENCOUNTER — Ambulatory Visit: Payer: PPO | Admitting: Interventional Cardiology

## 2019-01-10 ENCOUNTER — Encounter: Payer: Self-pay | Admitting: Internal Medicine

## 2019-01-10 ENCOUNTER — Ambulatory Visit (INDEPENDENT_AMBULATORY_CARE_PROVIDER_SITE_OTHER): Payer: PPO | Admitting: Internal Medicine

## 2019-01-10 DIAGNOSIS — J209 Acute bronchitis, unspecified: Secondary | ICD-10-CM | POA: Diagnosis not present

## 2019-01-10 DIAGNOSIS — J431 Panlobular emphysema: Secondary | ICD-10-CM

## 2019-01-10 DIAGNOSIS — B370 Candidal stomatitis: Secondary | ICD-10-CM

## 2019-01-10 DIAGNOSIS — B3781 Candidal esophagitis: Secondary | ICD-10-CM | POA: Diagnosis not present

## 2019-01-10 DIAGNOSIS — N183 Chronic kidney disease, stage 3 unspecified: Secondary | ICD-10-CM

## 2019-01-10 DIAGNOSIS — G894 Chronic pain syndrome: Secondary | ICD-10-CM | POA: Diagnosis not present

## 2019-01-10 MED ORDER — BENZONATATE 200 MG PO CAPS: 200.0000 mg | ORAL_CAPSULE | Freq: Three times a day (TID) | ORAL | 1 refills | Status: DC | PRN

## 2019-01-10 MED ORDER — AZITHROMYCIN 250 MG PO TABS: ORAL_TABLET | ORAL | 0 refills | Status: DC

## 2019-01-10 MED ORDER — FLUCONAZOLE 100 MG PO TABS: 200.0000 mg | ORAL_TABLET | Freq: Every day | ORAL | 1 refills | Status: DC

## 2019-01-10 NOTE — Assessment & Plan Note (Signed)
Hydration °

## 2019-01-10 NOTE — Progress Notes (Signed)
Virtual Visit via Video Note  I connected with Diane Abbott on 01/10/19 at  8:30 AM EDT by a video enabled telemedicine application and verified that I am speaking with the correct person using two identifiers.   I discussed the limitations of evaluation and management by telemedicine and the availability of in person appointments. The patient expressed understanding and agreed to proceed.  History of Present Illness: We need to follow-up on cough x 1 week, productive C/o thrush on abx F/u chronic pain  There has been no runny nose, chest pain, shortness of breath, abdominal pain, skin rashes.   Observations/Objective: The patient appears to be in no acute distress, looks well.  Assessment and Plan:  See my Assessment and Plan. Follow Up Instructions:    I discussed the assessment and treatment plan with the patient. The patient was provided an opportunity to ask questions and all were answered. The patient agreed with the plan and demonstrated an understanding of the instructions.   The patient was advised to call back or seek an in-person evaluation if the symptoms worsen or if the condition fails to improve as anticipated.  I provided face-to-face time during this encounter. We were at different locations.   Walker Kehr, MD

## 2019-01-10 NOTE — Assessment & Plan Note (Signed)
Oxycod prn

## 2019-01-10 NOTE — Assessment & Plan Note (Signed)
No wheezing Tessalon prn Treat bronchitis

## 2019-01-10 NOTE — Assessment & Plan Note (Signed)
Diane Abbott

## 2019-01-10 NOTE — Assessment & Plan Note (Signed)
Recurrent post - abx  Diflucan if needed

## 2019-02-01 ENCOUNTER — Telehealth: Payer: Self-pay | Admitting: Interventional Cardiology

## 2019-02-01 NOTE — Telephone Encounter (Signed)
  Patient is returning call for echo results

## 2019-02-01 NOTE — Telephone Encounter (Signed)
Left message to call back  

## 2019-02-02 NOTE — Telephone Encounter (Signed)
Left message to call back  

## 2019-02-03 ENCOUNTER — Telehealth: Payer: Self-pay | Admitting: Rheumatology

## 2019-02-03 NOTE — Telephone Encounter (Signed)
Patient needs refill on Enbrel sent to [facility]. Patient due for next injection Tuesday 02/07/2019, and is out. Please call patient to advise.

## 2019-02-06 NOTE — Progress Notes (Signed)
Office Visit Note  Patient: Diane Abbott             Date of Birth: [birth year]           MRN: [id number]             PCP: Cassandria Anger, MD Referring: Cassandria Anger, MD Visit Date: 02/08/2019 Occupation: @GUAROCC @  Subjective:  Pain in both hands   History of Present Illness: Diane Abbott is a 84 y.o. female with history of seronegative rheumatoid arthritis and fibromyalgia.  Patient is on Enbrel 50 mg subcutaneous injections every 21 days.  Patient states that the last week leading up to the injection she experiences increased pain and stiffness.  She is having pain in multiple joints and has generalized muscle aches and muscle tenderness due to fibromyalgia.  She has chronic pain in bilateral thumbs and has noticed worsening joint damage.  She continues to play the piano on a regular basis.  She has noticed more pedal edema and continues to take toresmide.  She had a echo on 12/28/18 which revealed right ventricular and left ventricular normal ejection fraction.    Activities of Daily Living:  Patient reports morning stiffness for 30 minutes.   Patient Denies nocturnal pain.  Difficulty dressing/grooming: Denies Difficulty climbing stairs: Reports Difficulty getting out of chair: Reports Difficulty using hands for taps, buttons, cutlery, and/or writing: Reports  Review of Systems  Constitutional: Positive for fatigue.  HENT: Negative for mouth sores, mouth dryness and nose dryness.   Eyes: Negative for pain, itching, visual disturbance and dryness.  Respiratory: Negative for cough, hemoptysis, shortness of breath and difficulty breathing.   Cardiovascular: Positive for swelling in legs/feet. Negative for chest pain, palpitations and hypertension.  Gastrointestinal: Negative for blood in stool, constipation and diarrhea.  Endocrine: Negative for increased urination.  Genitourinary: Negative for difficulty urinating and painful urination.  Musculoskeletal:  Positive for arthralgias, gait problem, joint pain, joint swelling and morning stiffness. Negative for myalgias, muscle weakness, muscle tenderness and myalgias.  Skin: Negative for color change, pallor, rash, hair loss, nodules/bumps, skin tightness, ulcers and sensitivity to sunlight.  Allergic/Immunologic: Negative for susceptible to infections.  Neurological: Positive for weakness. Negative for headaches and memory loss.  Hematological: Negative for swollen glands.  Psychiatric/Behavioral: Positive for sleep disturbance. Negative for depressed mood and confusion. The patient is not nervous/anxious.     PMFS History:  Patient Active Problem List   Diagnosis Date Noted  . Hand laceration 10/20/2018  . Abdominal pain 07/15/2018  . History of left shoulder replacement 11/13/2016  . History of total knee replacement, bilateral 11/13/2016  . History of myeloproliferative disorder 09/17/2016  . History of IBS 09/17/2016  . Diskitis 08/29/2015  . Acute osteomyelitis of lumbar spine ([place]) 07/29/2015  . Thoracic compression fracture ([place]) 07/29/2015  . Discitis of lumbosacral region   . Epidural abscess   . Chronic renal insufficiency, stage III (moderate) 02/28/2015  . Hammer toe, acquired 11/29/2014  . S/P ascending aortic replacement 10/04/2012  . S/P AVR (aortic valve replacement) 10/04/2012  . Aortic aneurysm, thoracic ([place]) 09/14/2012  . COPD (chronic obstructive pulmonary disease) ([place]) 03/12/2011  . RESTLESS LEG SYNDROME 02/03/2010  . Thrush of mouth and esophagus ([place]) 07/04/2009  . PULMONARY EMBOLISM 09/14/2008  . BRONCHITIS, ACUTE 06/06/2008  . Chest pain 07/13/2007  . GERD 07/08/2007  . Irritable bowel syndrome 07/08/2007  . Chronic pain 07/08/2007  . OSTEOARTHRITIS 03/10/2007  . Osteopenia 03/10/2007  . PULMONARY EMBOLISM, HX OF  03/10/2007  . Rheumatoid arthritis ([place]) 11/23/2006    Past Medical History:  Diagnosis Date  . Adrenal insufficiency ([place])   . Anemia   .  Anginal pain [facility])    "comes and goes has occured since 2012"  . Blood dyscrasia    "problems with platelets, red cells and white cells being low"  . Bronchitis Jan 2013-March 2013   severe  . Cancer ([place])    skin cancer on face  . Cataracts, bilateral    more in left than right  . Depression   . Esophagitis   . Fatty liver   . Fibromyalgia   . Fibromyalgia   . Foot pain, left 08/26/2016   2/18 1st MTP - gout vs other  . GERD (gastroesophageal reflux disease)   . Heart murmur   . History of blood clots 2008   below knee post trauma. Dilated [facility] no clotting disorder felt to be present.  . History of echocardiogram    Echo 11/16: Mild LVH, EF 60-65%, normal wall motion, grade 1 diastolic dysfunction, bioprosthetic AVR with mean gradient 21 mmHg and no regurgitation, proximal aortic arch aneurysm 49 mm, moderate TR, PASP 31 mmHg  . IBS (irritable bowel syndrome)   . Leaky heart valve   . Osteoarthritis   . Osteoarthritis of left shoulder 12/07/2011  . Osteopenia   . Pulmonary embolism ([place])   . RA (rheumatoid arthritis) ([place])   . Shortness of breath    attributed to aneurysm  . Sliding hiatal hernia   . Urinary leakage    when coughing  . Vertigo    hx of  . Vitamin B 12 deficiency   . Vitamin D deficiency     Family History  Problem Relation Age of Onset  . Ovarian cancer Mother   . Diabetes Mother   . COPD Father   . Heart disease Maternal Grandmother   . Clotting disorder Other        Father, brother, sister, and daughter all had deep venous thrombosis  . Breast cancer Other   . Pancreatic cancer Paternal Uncle   . Colon cancer Neg Hx    Past Surgical History:  Procedure Laterality Date  . ABDOMINAL HYSTERECTOMY    . AORTA SURGERY  12/2012   Resection of aneurysm and valve replacement  . APPENDECTOMY  1956  . BENTALL PROCEDURE N/A 09/29/2012   Procedure: BENTALL PROCEDURE;  Surgeon: Gaye Pollack, MD;  Location: [place];  Service: Open Heart Surgery;   Laterality: N/A;  WITH CIRC ARREST  . BILATERAL OOPHORECTOMY    . BREAST BIOPSY Left   . CARDIAC CATHETERIZATION  09/15/12   no PCI  . CATARACT EXTRACTION Bilateral   . CHOLECYSTECTOMY  2003  . COLONOSCOPY    . COSMETIC SURGERY     Tummy tuck  . INTRAOPERATIVE TRANSESOPHAGEAL ECHOCARDIOGRAM N/A 09/29/2012   Procedure: INTRAOPERATIVE TRANSESOPHAGEAL ECHOCARDIOGRAM;  Surgeon: Gaye Pollack, MD;  Location: [facility];  Service: Open Heart Surgery;  Laterality: N/A;  . JOINT REPLACEMENT Bilateral   . REPLACEMENT ASCENDING AORTA N/A 06/29/2016   Procedure: AORTIC ARCH REPLACEMENT WITH CIRC ARREST (N/A) - RIGHT AXILLARY CANNULATION  AORTO-AORTIC 28 HEMASHIELD GRAFT WITH AORTO-INNOMINATE AND AORTO-LEFT CAROTID ANASTAMOSIS;  Surgeon: Gaye Pollack, MD;  Location: [place] OR;  Service: Open Heart Surgery;  Laterality: N/A;  RIGHT AXILLARY CANNULATION  BILATERAL RADIAL A-LINE  . SKIN CANCER EXCISION Right    cheek  . TEE WITHOUT CARDIOVERSION N/A 06/29/2016   Procedure: TRANSESOPHAGEAL  ECHOCARDIOGRAM (TEE);  Surgeon: Gaye Pollack, MD;  Location: [place];  Service: Open Heart Surgery;  Laterality: N/A;  . TONSILLECTOMY  1941  . TOTAL KNEE ARTHROPLASTY     bilateral  . TOTAL SHOULDER ARTHROPLASTY  12/07/2011   Procedure: TOTAL SHOULDER ARTHROPLASTY;  Surgeon: Johnny Bridge, MD;  Location: [place];  Service: Orthopedics;  Laterality: Left;  left total shoulder artthroplasty  . WRIST SURGERY     bilateral   Social History   Social History Narrative  . Not on file   Immunization History  Administered Date(s) Administered  . Influenza Whole 03/10/2007, 02/01/2008  . Pneumococcal Conjugate-13 03/23/2013  . Pneumococcal Polysaccharide-23 02/03/2010  . Td 03/21/2012  . Tdap 10/17/2018  . Zoster 11/04/2010     Objective: Vital Signs: BP (!) 142/76 (BP Location: Right Arm, Patient Position: Sitting, Cuff Size: Normal)   Pulse 73   Resp 15   Ht 5' (1.524 m)   Wt 170 lb (77.1 kg)   BMI 33.20 kg/m     Physical Exam Vitals signs and nursing note reviewed.  Constitutional:      Appearance: She is well-developed.  HENT:     Head: Normocephalic and atraumatic.  Eyes:     Conjunctiva/sclera: Conjunctivae normal.  Neck:     Musculoskeletal: Normal range of motion.  Cardiovascular:     Rate and Rhythm: Normal rate and regular rhythm.     Heart sounds: Normal heart sounds.  Pulmonary:     Effort: Pulmonary effort is normal.     Breath sounds: Normal breath sounds.  Abdominal:     General: Bowel sounds are normal.     Palpations: Abdomen is soft.  Lymphadenopathy:     Cervical: No cervical adenopathy.  Skin:    General: Skin is warm and dry.     Capillary Refill: Capillary refill takes less than 2 seconds.  Neurological:     Mental Status: She is alert and oriented to person, place, and time.  Psychiatric:        Behavior: Behavior normal.      Musculoskeletal Exam: C-spine limited range of motion.  Thoracic kyphosis noted.  Limited range of motion lumbar spine.  No midline spinal tenderness.  Left shoulder replacement has good range of motion with discomfort.  Right shoulder has full range of motion with no discomfort at this time.  Elbow joints good range of motion no tenderness or inflammation.  Limited range of motion of bilateral wrist joints.  She has extensor tenosynovitis of the left wrist.  Synovial thickening of all MCP joints.  Ulnar deviation bilaterally.  PIP and DIP synovial thickening consistent with osteoarthritis of both hands.  Subluxation of bilateral first MCP joints.  Hip joints difficult to assess in a seated position.  Left knee replacement has limited range of motion with discomfort.  She has warmth of bilateral knee joints.  She has pedal edema bilaterally.   CDAI Exam: CDAI Score: - Patient Global: -; Provider Global: - Swollen: 1 ; Tender: 1  Joint Exam      Right  Left  Wrist     Swollen Tender     Investigation: No additional findings.  Imaging:  No results found.  Recent Labs: Lab Results  Component Value Date   WBC 3.8 (L) 11/24/2018   HGB 12.6 11/24/2018   PLT 146.0 (L) 11/24/2018   NA 138 11/24/2018   K 2.8 (LL) 11/24/2018   CL 98 11/24/2018   CO2 28 11/24/2018  GLUCOSE 113 (H) 11/24/2018   BUN 46 (H) 11/24/2018   CREATININE 2.28 (H) 11/24/2018   BILITOT 0.7 11/24/2018   ALKPHOS 67 11/24/2018   AST 20 11/24/2018   ALT 10 11/24/2018   PROT 7.5 11/24/2018   ALBUMIN 4.3 11/24/2018   CALCIUM 9.5 11/24/2018   GFRAA 43 (L) 10/17/2018   QFTBGOLD TNP 03/12/2015   QFTBGOLDPLUS NEGATIVE 11/24/2018    Speciality Comments: [facility] therapy included: Simponi, Arava, Cimzia and Humira (inadequate response), methotrexate and Remicade (intolerance)  Procedures:  No procedures performed Allergies: Acetaminophen, Codeine, Pramipexole dihydrochloride, Rofecoxib, Venlafaxine, Arava [leflunomide], Clonazepam, Diclofenac sodium, Alprazolam, Darvocet [propoxyphene n-acetaminophen], Duloxetine, Gabapentin, Latex, Morphine and related, Nortriptyline hcl, Penicillins, and Prednisone  Enbrel Sureclick 50 mg every 21 days.  Last TB gold negative on 11/24/2018 and will monitor yearly.  Most recent CBC/CMP showed elevated WBC, low platelets, and elevated creatinine/decreased GFR on 11/24/2018.  Assessment / Plan:     Visit Diagnoses: Rheumatoid arthritis of multiple sites with negative rheumatoid factor (HCC) - Severe erosive disease: She has severe erosive rheumatoid arthritis.  She has chronic pain in multiple joints.  The pain is most severe in bilateral hands.  She has synovial thickening and ulnar deviation of all MCP joints.  She has PIP and DIP synovial thickening as well.  She has subluxation of bilateral first MCP joints.  She has left wrist extensor tenosynovitis.  She has been injecting Enbrel 50 mg subcutaneously every 21 days.  She states that the week leading up to the Enbrel injection she experiences increased stiffness, joint swelling,  and joint pain.  Her most recent injection was on Monday, 02/06/2019.  She states that her quality of life has diminished significantly since having to space the dose of Enbrel.  She experiences severe pain on a daily basis and states that she is here for "quality of life not quantity of life."  She is aware of the risks of increasing the frequency of Enbrel injections.  In the past she has had recurrent infections as well as chronic thrombocytopenia.  On 01/10/2019 she had a virtual visit with her internist and she was treated with a Z-Pak for bronchitis and Diflucan for thrush.  She would like to increase the frequency of Enbrel 50 mg to every 14 days.  We did not recommend increasing the frequency but she is insistent.  We will follow along with her closely and monitor lab work today and every 3 months.  She was advised to notify us if she develops an increase in the frequency of infections.  She will follow-up in the office in 3 months.  Rheumatoid nodulosis (HCC):Stable   High risk medication use - Enbrel 50 mg sq q 2 wks.Previous tx: Carita Pian was held in January 2017 due to discitis, inadequate response-Cimzia, Humira, intolerance MTX& Remicade) CBC and CMP will be drawn today to monitor for drug toxicity.- Plan: CBC with Differential/Platelet, COMPLETE METABOLIC PANEL WITH GFR  Thrombocytopenia due to drugs: She has chronic thrombocytopenia.  Platelet count on 11/24/2018 was 146.  CBC will be drawn today.  Fibromyalgia: She has generalized muscle aches and muscle tenderness due to fibromyalgia.  She has generalized hyperalgesia and positive tender points on exam.  She experiences frequent fibromyalgia flares.  She has chronic fatigue related to insomnia.  She continues to have interrupted sleep at night due to discomfort she experiences.  History of left shoulder replacement - Dr. Mardelle Matte: She has good range of motion with some discomfort  History of total knee  replacement, bilateral: She has  chronic pain in the left knee which is replacement. Warmth of bilateral knees noted.  She has difficulty walking due to discomfort she experiences.  She walks with a cane to assist with ambulation.  She tried going to physical therapy due to having increased pain in the left knee but only went to 2 sessions and has discontinued.  History of chronic kidney disease: Creatinine was 2.28 and GFR was 20.48 on 11/24/2018.  History of vitamin D deficiency  Other osteoporosis without current pathological fracture - She discontinued Forteo due to it causing dizziness and frequent falls.   History of vertebral fracture: No midline spinal tenderness at this time.   Other medical conditions are listed as follows:   Moderate episode of recurrent major depressive disorder (HCC)  History of COPD  History of DVT (deep vein thrombosis)  S/P AVR (aortic valve replacement)  History of gastroesophageal reflux (GERD)  History of myeloproliferative disorder  H/O aortic arch replacement  History of IBS   Orders: Orders Placed This Encounter  Procedures  . CBC with Differential/Platelet  . COMPLETE METABOLIC PANEL WITH GFR   No orders of the defined types were placed in this encounter.   Face-to-face time spent with patient was 30 minutes. Greater than 50% of time was spent in counseling and coordination of care.  Follow-Up Instructions: Return in about 3 months (around 05/11/2019) for Rheumatoid arthritis, Fibromyalgia.   Diane Neas, PA-C   I examined and evaluated the patient with Diane Sams PA.  We had detailed discussion with the patient today.  Her Enbrel dose was spaced to every 3 weeks due to history of frequent infections.  She is quite persistent that she wants to take Enbrel every other week.  Risk of increasing the frequency was discussed at length.  Per her wishes we will increase the frequency of Enbrel to every other week.  The plan of care was discussed as noted above.  Bo Merino, MD  Note - This record has been created using Editor, commissioning.  Chart creation errors have been sought, but may not always  have been located. Such creation errors do not reflect on  the standard of medical care.

## 2019-02-06 NOTE — Telephone Encounter (Signed)
Attempted to contact the patient and left message for patient to call the office.  

## 2019-02-07 NOTE — Telephone Encounter (Signed)
Left message to call back Released results to [facility]

## 2019-02-08 ENCOUNTER — Ambulatory Visit: Payer: PPO | Admitting: Rheumatology

## 2019-02-08 ENCOUNTER — Encounter: Payer: Self-pay | Admitting: Rheumatology

## 2019-02-08 ENCOUNTER — Other Ambulatory Visit: Payer: Self-pay

## 2019-02-08 VITALS — BP 142/76 | HR 73 | Resp 15 | Ht 60.0 in | Wt 170.0 lb

## 2019-02-08 DIAGNOSIS — M818 Other osteoporosis without current pathological fracture: Secondary | ICD-10-CM

## 2019-02-08 DIAGNOSIS — Z952 Presence of prosthetic heart valve: Secondary | ICD-10-CM

## 2019-02-08 DIAGNOSIS — M797 Fibromyalgia: Secondary | ICD-10-CM | POA: Diagnosis not present

## 2019-02-08 DIAGNOSIS — Z8639 Personal history of other endocrine, nutritional and metabolic disease: Secondary | ICD-10-CM | POA: Diagnosis not present

## 2019-02-08 DIAGNOSIS — M0609 Rheumatoid arthritis without rheumatoid factor, multiple sites: Secondary | ICD-10-CM | POA: Diagnosis not present

## 2019-02-08 DIAGNOSIS — Z96653 Presence of artificial knee joint, bilateral: Secondary | ICD-10-CM | POA: Diagnosis not present

## 2019-02-08 DIAGNOSIS — D6959 Other secondary thrombocytopenia: Secondary | ICD-10-CM | POA: Diagnosis not present

## 2019-02-08 DIAGNOSIS — Z79899 Other long term (current) drug therapy: Secondary | ICD-10-CM

## 2019-02-08 DIAGNOSIS — M0630 Rheumatoid nodule, unspecified site: Secondary | ICD-10-CM | POA: Diagnosis not present

## 2019-02-08 DIAGNOSIS — Z87448 Personal history of other diseases of urinary system: Secondary | ICD-10-CM

## 2019-02-08 DIAGNOSIS — Z862 Personal history of diseases of the blood and blood-forming organs and certain disorders involving the immune mechanism: Secondary | ICD-10-CM

## 2019-02-08 DIAGNOSIS — Z96612 Presence of left artificial shoulder joint: Secondary | ICD-10-CM

## 2019-02-08 DIAGNOSIS — Z95828 Presence of other vascular implants and grafts: Secondary | ICD-10-CM

## 2019-02-08 DIAGNOSIS — Z86718 Personal history of other venous thrombosis and embolism: Secondary | ICD-10-CM

## 2019-02-08 DIAGNOSIS — Z8781 Personal history of (healed) traumatic fracture: Secondary | ICD-10-CM | POA: Diagnosis not present

## 2019-02-08 DIAGNOSIS — Z8709 Personal history of other diseases of the respiratory system: Secondary | ICD-10-CM

## 2019-02-08 DIAGNOSIS — Z8719 Personal history of other diseases of the digestive system: Secondary | ICD-10-CM

## 2019-02-08 DIAGNOSIS — T50905A Adverse effect of unspecified drugs, medicaments and biological substances, initial encounter: Secondary | ICD-10-CM

## 2019-02-08 DIAGNOSIS — F331 Major depressive disorder, recurrent, moderate: Secondary | ICD-10-CM

## 2019-02-09 LAB — CBC WITH DIFFERENTIAL/PLATELET
Absolute Monocytes: 401 cells/uL (ref 200–950)
Basophils Absolute: 41 cells/uL (ref 0–200)
Basophils Relative: 1.2 %
Eosinophils Absolute: 190 cells/uL (ref 15–500)
Eosinophils Relative: 5.6 %
HCT: 35.6 % (ref 35.0–45.0)
Hemoglobin: 12 g/dL (ref 11.7–15.5)
Lymphs Abs: 1295 cells/uL (ref 850–3900)
MCH: 31.8 pg (ref 27.0–33.0)
MCHC: 33.7 g/dL (ref 32.0–36.0)
MCV: 94.4 fL (ref 80.0–100.0)
MPV: 10.3 fL (ref 7.5–12.5)
Monocytes Relative: 11.8 %
Neutro Abs: 1472 cells/uL — ABNORMAL LOW (ref 1500–7800)
Neutrophils Relative %: 43.3 %
Platelets: 117 10*3/uL — ABNORMAL LOW (ref 140–400)
RBC: 3.77 10*6/uL — ABNORMAL LOW (ref 3.80–5.10)
RDW: 13 % (ref 11.0–15.0)
Total Lymphocyte: 38.1 %
WBC: 3.4 10*3/uL — ABNORMAL LOW (ref 3.8–10.8)

## 2019-02-09 LAB — COMPLETE METABOLIC PANEL WITH GFR
AG Ratio: 1.5 (calc) (ref 1.0–2.5)
ALT: 10 U/L (ref 6–29)
AST: 19 U/L (ref 10–35)
Albumin: 4 g/dL (ref 3.6–5.1)
Alkaline phosphatase (APISO): 64 U/L (ref 37–153)
BUN/Creatinine Ratio: 23 (calc) — ABNORMAL HIGH (ref 6–22)
BUN: 26 mg/dL — ABNORMAL HIGH (ref 7–25)
CO2: 25 mmol/L (ref 20–32)
Calcium: 9.1 mg/dL (ref 8.6–10.4)
Chloride: 107 mmol/L (ref 98–110)
Creat: 1.15 mg/dL — ABNORMAL HIGH (ref 0.60–0.88)
GFR, Est African American: 51 mL/min/{1.73_m2} — ABNORMAL LOW (ref >=60)
GFR, Est Non African American: 44 mL/min/{1.73_m2} — ABNORMAL LOW (ref >=60)
Globulin: 2.6 g/dL (calc) (ref 1.9–3.7)
Glucose, Bld: 95 mg/dL (ref 65–99)
Potassium: 4.8 mmol/L (ref 3.5–5.3)
Sodium: 139 mmol/L (ref 135–146)
Total Bilirubin: 0.5 mg/dL (ref 0.2–1.2)
Total Protein: 6.6 g/dL (ref 6.1–8.1)

## 2019-02-09 NOTE — Progress Notes (Signed)
WBC count is low-3.4.  RBC count is borderline low.  Plts are low.  Creatinine elevated but trending down.  GFR improving-44.  We will continue to monitor.

## 2019-02-14 ENCOUNTER — Telehealth: Payer: Self-pay | Admitting: Rheumatology

## 2019-02-14 DIAGNOSIS — M0609 Rheumatoid arthritis without rheumatoid factor, multiple sites: Secondary | ICD-10-CM

## 2019-02-14 MED ORDER — ENBREL SURECLICK 50 MG/ML ~~LOC~~ SOAJ: 50.0000 mg | SUBCUTANEOUS | 0 refills | Status: DC

## 2019-02-14 NOTE — Telephone Encounter (Signed)
Last Visit: 02/08/19 Next Visit: 05/11/19 Labs: 02/08/19 WBC count is low-3.4. RBC count is borderline low. Plts are low. Creatinine elevated but trending down. GFR improving-44.  TB Gold: 11/24/18 Neg   Okay to refill per Dr. Estanislado Pandy   Left message to advise prescription has been sent to the pharmacy.

## 2019-02-14 NOTE — Telephone Encounter (Signed)
Patient requesting refill for Enbrel be sent to [facility]. Patient was under the impression it was going to be done at her last office visit. Per patient, [facility] has told her they have sent two refill requests to [place] since her last office visit.  Please call to advise.

## 2019-02-21 ENCOUNTER — Telehealth: Payer: Self-pay | Admitting: Rheumatology

## 2019-02-21 NOTE — Telephone Encounter (Signed)
Patient left a voicemail stating she called for prescription refill of Enbrel to be sent to [facility].  Patient states she received a message from Seth Bake that the prescription had been sent to the pharmacy on 02/14/19.  Patient states "she called today and again was told they have not received the prescription."  Patient states "I don't know what happened, but I was due for an injection yesterday."  Patient is requesting the prescription be sent to [facility] ASAP.

## 2019-02-21 NOTE — Telephone Encounter (Signed)
Prescription was sent to the pharmacy on 02/14/19. On our end of the prescription it reads receipt confirmed by pharmacy. Contacted [facility] and spoke with Grayland Ormond, he advised they did not receive prescription. Spoke with Anderson Malta the pharmacist and gave verbal authorization for refill.    Attempted to contact the patient and unable to leave a message, mailbox is full.

## 2019-03-03 ENCOUNTER — Telehealth: Payer: Self-pay | Admitting: Rheumatology

## 2019-03-03 NOTE — Telephone Encounter (Signed)
Patient calling to let you know she still has not gotten her Enbrel from [facility]. Per patient, she was told from someone at [facility] a letter was sent to them back in July that patient had a reaction to Enbrel, and could no longer take that medication. Patient states that note should have said Forteo not Enbrel. Patient was due for injection on the 19th. Please call to advise. Patient is getting very frustrated with this.

## 2019-03-06 NOTE — Telephone Encounter (Signed)
Spoke with [facility] and they advised they did not receive a letter from our office that patient had a reaction to the medication. Patient reported that she had stopped medication in July 2020 due to cutting her hand. That adverse event has been released. They have the prescription and will get the medication out to the patient urgently. Patient should get the medication Tuesday or Wednesday. Left message to advise patient.

## 2019-03-13 ENCOUNTER — Emergency Department (HOSPITAL_COMMUNITY): Payer: PPO

## 2019-03-13 ENCOUNTER — Encounter (HOSPITAL_COMMUNITY): Payer: Self-pay

## 2019-03-13 ENCOUNTER — Other Ambulatory Visit: Payer: Self-pay

## 2019-03-13 ENCOUNTER — Emergency Department (HOSPITAL_COMMUNITY)
Admission: EM | Admit: 2019-03-13 | Discharge: 2019-03-13 | Disposition: A | Discharge status: Home / Self Care | Payer: PPO | Attending: Emergency Medicine | Admitting: Emergency Medicine

## 2019-03-13 DIAGNOSIS — M069 Rheumatoid arthritis, unspecified: Secondary | ICD-10-CM | POA: Diagnosis not present

## 2019-03-13 DIAGNOSIS — Y999 Unspecified external cause status: Secondary | ICD-10-CM | POA: Diagnosis not present

## 2019-03-13 DIAGNOSIS — Z79899 Other long term (current) drug therapy: Secondary | ICD-10-CM | POA: Diagnosis not present

## 2019-03-13 DIAGNOSIS — Z91040 Latex allergy status: Secondary | ICD-10-CM | POA: Insufficient documentation

## 2019-03-13 DIAGNOSIS — S199XXA Unspecified injury of neck, initial encounter: Secondary | ICD-10-CM | POA: Diagnosis not present

## 2019-03-13 DIAGNOSIS — J449 Chronic obstructive pulmonary disease, unspecified: Secondary | ICD-10-CM | POA: Insufficient documentation

## 2019-03-13 DIAGNOSIS — S43111A Subluxation of right acromioclavicular joint, initial encounter: Secondary | ICD-10-CM | POA: Diagnosis not present

## 2019-03-13 DIAGNOSIS — R52 Pain, unspecified: Secondary | ICD-10-CM | POA: Diagnosis not present

## 2019-03-13 DIAGNOSIS — Z96653 Presence of artificial knee joint, bilateral: Secondary | ICD-10-CM | POA: Insufficient documentation

## 2019-03-13 DIAGNOSIS — G4489 Other headache syndrome: Secondary | ICD-10-CM | POA: Diagnosis not present

## 2019-03-13 DIAGNOSIS — W19XXXA Unspecified fall, initial encounter: Secondary | ICD-10-CM | POA: Diagnosis not present

## 2019-03-13 DIAGNOSIS — W010XXA Fall on same level from slipping, tripping and stumbling without subsequent striking against object, initial encounter: Secondary | ICD-10-CM | POA: Diagnosis not present

## 2019-03-13 DIAGNOSIS — S0003XA Contusion of scalp, initial encounter: Secondary | ICD-10-CM | POA: Diagnosis not present

## 2019-03-13 DIAGNOSIS — Y9289 Other specified places as the place of occurrence of the external cause: Secondary | ICD-10-CM | POA: Diagnosis not present

## 2019-03-13 DIAGNOSIS — M25512 Pain in left shoulder: Secondary | ICD-10-CM | POA: Diagnosis not present

## 2019-03-13 DIAGNOSIS — S0990XA Unspecified injury of head, initial encounter: Secondary | ICD-10-CM | POA: Diagnosis not present

## 2019-03-13 DIAGNOSIS — M25511 Pain in right shoulder: Secondary | ICD-10-CM | POA: Diagnosis not present

## 2019-03-13 DIAGNOSIS — Z952 Presence of prosthetic heart valve: Secondary | ICD-10-CM | POA: Diagnosis not present

## 2019-03-13 DIAGNOSIS — I10 Essential (primary) hypertension: Secondary | ICD-10-CM | POA: Diagnosis not present

## 2019-03-13 DIAGNOSIS — R457 State of emotional shock and stress, unspecified: Secondary | ICD-10-CM | POA: Diagnosis not present

## 2019-03-13 DIAGNOSIS — S4992XA Unspecified injury of left shoulder and upper arm, initial encounter: Secondary | ICD-10-CM | POA: Diagnosis not present

## 2019-03-13 DIAGNOSIS — Y9301 Activity, walking, marching and hiking: Secondary | ICD-10-CM | POA: Insufficient documentation

## 2019-03-13 MED ORDER — OXYCODONE HCL 5 MG PO TABS: 5.0000 mg | ORAL_TABLET | Freq: Four times a day (QID) | ORAL | 0 refills | Status: DC | PRN

## 2019-03-13 MED ORDER — FENTANYL CITRATE (PF) 100 MCG/2ML IJ SOLN
50.0000 ug | Freq: Once | INTRAMUSCULAR | Status: AC
  Administered 2019-03-13: 50 ug via INTRAVENOUS
  Filled 2019-03-13: qty 2

## 2019-03-13 MED ORDER — HYDROMORPHONE HCL 1 MG/ML IJ SOLN
0.5000 mg | Freq: Once | INTRAMUSCULAR | Status: AC
  Administered 2019-03-13: 0.5 mg via INTRAVENOUS
  Filled 2019-03-13: qty 1

## 2019-03-13 NOTE — ED Provider Notes (Signed)
[facility] EMERGENCY DEPARTMENT Provider Note   CSN: [id number] Arrival date & time: 03/13/19  1125     History   Chief Complaint Chief Complaint  Patient presents with   Head Injury    HPI Diane Abbott is a 84 y.o. female.     Patient fell and hit his head.  No loss of consciousness.  Patient also complains of pain in the right shoulder.  Patient slipped and fell  The history is provided by the patient. No language interpreter was used.  Head Injury Location:  Frontal Mechanism of injury: fall   Fall:    Fall occurred: Fell walking.   Height of fall:  From floor   Impact surface:  Hard floor   Point of impact:  Head   Entrapped after fall: no   Pain details:    Quality:  Aching   Radiates to:  Face   Severity:  Mild Associated symptoms: no headaches and no seizures     Past Medical History:  Diagnosis Date   Adrenal insufficiency ([place])    Anemia    Anginal pain ([place])    "comes and goes has occured since 2012"   Blood dyscrasia    "problems with platelets, red cells and white cells being low"   Bronchitis Jan 2013-March 2013   severe   Cancer [facility])    skin cancer on face   Cataracts, bilateral    more in left than right   Depression    Esophagitis    Fatty liver    Fibromyalgia    Fibromyalgia    Foot pain, left 08/26/2016   2/18 1st MTP - gout vs other   GERD (gastroesophageal reflux disease)    Heart murmur    History of blood clots 2008   below knee post trauma. Dilated [facility] no clotting disorder felt to be present.   History of echocardiogram    Echo 11/16: Mild LVH, EF 60-65%, normal wall motion, grade 1 diastolic dysfunction, bioprosthetic AVR with mean gradient 21 mmHg and no regurgitation, proximal aortic arch aneurysm 49 mm, moderate TR, PASP 31 mmHg   IBS (irritable bowel syndrome)    Leaky heart valve    Osteoarthritis    Osteoarthritis of left shoulder 12/07/2011   Osteopenia    Pulmonary embolism  (HCC)    RA (rheumatoid arthritis) (HCC)    Shortness of breath    attributed to aneurysm   Sliding hiatal hernia    Urinary leakage    when coughing   Vertigo    hx of   Vitamin B 12 deficiency    Vitamin D deficiency     Patient Active Problem List   Diagnosis Date Noted   Hand laceration 10/20/2018   Abdominal pain 07/15/2018   History of left shoulder replacement 11/13/2016   History of total knee replacement, bilateral 11/13/2016   History of myeloproliferative disorder 09/17/2016   History of IBS 09/17/2016   Diskitis 08/29/2015   Acute osteomyelitis of lumbar spine ([place]) 07/29/2015   Thoracic compression fracture ([place]) 07/29/2015   Discitis of lumbosacral region    Epidural abscess    Chronic renal insufficiency, stage III (moderate) 02/28/2015   Hammer toe, acquired 11/29/2014   S/P ascending aortic replacement 10/04/2012   S/P AVR (aortic valve replacement) 10/04/2012   Aortic aneurysm, thoracic ([place]) 09/14/2012   COPD (chronic obstructive pulmonary disease) ([place]) 03/12/2011   RESTLESS LEG SYNDROME 02/03/2010   Thrush of mouth and esophagus ([place]) 07/04/2009  PULMONARY EMBOLISM 09/14/2008   BRONCHITIS, ACUTE 06/06/2008   Chest pain 07/13/2007   GERD 07/08/2007   Irritable bowel syndrome 07/08/2007   Chronic pain 07/08/2007   OSTEOARTHRITIS 03/10/2007   Osteopenia 03/10/2007   PULMONARY EMBOLISM, HX OF 03/10/2007   Rheumatoid arthritis ([place]) 11/23/2006    Past Surgical History:  Procedure Laterality Date   ABDOMINAL HYSTERECTOMY     AORTA SURGERY  12/2012   Resection of aneurysm and valve replacement   APPENDECTOMY  1956   BENTALL PROCEDURE N/A 09/29/2012   Procedure: BENTALL PROCEDURE;  Surgeon: Gaye Pollack, MD;  Location: [place];  Service: Open Heart Surgery;  Laterality: N/A;  WITH CIRC ARREST   BILATERAL OOPHORECTOMY     BREAST BIOPSY Left    CARDIAC CATHETERIZATION  09/15/12   no PCI   CATARACT  EXTRACTION Bilateral    CHOLECYSTECTOMY  2003   COLONOSCOPY     COSMETIC SURGERY     Tummy tuck   INTRAOPERATIVE TRANSESOPHAGEAL ECHOCARDIOGRAM N/A 09/29/2012   Procedure: INTRAOPERATIVE TRANSESOPHAGEAL ECHOCARDIOGRAM;  Surgeon: Gaye Pollack, MD;  Location: [place] OR;  Service: Open Heart Surgery;  Laterality: N/A;   JOINT REPLACEMENT Bilateral    REPLACEMENT ASCENDING AORTA N/A 06/29/2016   Procedure: AORTIC ARCH REPLACEMENT WITH CIRC ARREST (N/A) - RIGHT AXILLARY CANNULATION  AORTO-AORTIC 28 HEMASHIELD GRAFT WITH AORTO-INNOMINATE AND AORTO-LEFT CAROTID ANASTAMOSIS;  Surgeon: Gaye Pollack, MD;  Location: [place] OR;  Service: Open Heart Surgery;  Laterality: N/A;  RIGHT AXILLARY CANNULATION  BILATERAL RADIAL A-LINE   SKIN CANCER EXCISION Right    cheek   TEE WITHOUT CARDIOVERSION N/A 06/29/2016   Procedure: TRANSESOPHAGEAL ECHOCARDIOGRAM (TEE);  Surgeon: Gaye Pollack, MD;  Location: [place];  Service: Open Heart Surgery;  Laterality: N/A;   TONSILLECTOMY  1941   TOTAL KNEE ARTHROPLASTY     bilateral   TOTAL SHOULDER ARTHROPLASTY  12/07/2011   Procedure: TOTAL SHOULDER ARTHROPLASTY;  Surgeon: Johnny Bridge, MD;  Location: [place];  Service: Orthopedics;  Laterality: Left;  left total shoulder artthroplasty   WRIST SURGERY     bilateral     OB History   No obstetric history on file.      Home Medications    Prior to Admission medications   Medication Sig Start Date End Date Taking? Authorizing Provider  aspirin 81 MG tablet Take 1 tablet (81 mg total) by mouth daily. 08/30/18   Belva Crome, MD  benzonatate (TESSALON) 200 MG capsule Take 1 capsule (200 mg total) by mouth 3 (three) times daily as needed for cough. 01/10/19   Plotnikov, Evie Lacks, MD  BREO ELLIPTA 100-25 MCG/INH AEPB INHALE 1 PUFF BY MOUTH ONCE DAILY Patient taking differently: Inhale 1 puff into the lungs daily.  09/27/18   Plotnikov, Evie Lacks, MD  etanercept (ENBREL SURECLICK) 50 MG/ML injection Inject 0.98 mLs  (50 mg total) into the skin every 14 (fourteen) days. 02/14/19   Bo Merino, MD  oxyCODONE (ROXICODONE) 5 MG immediate release tablet Take 1 tablet (5 mg total) by mouth every 6 (six) hours as needed for severe pain. 03/13/19   Milton Ferguson, MD  pantoprazole (PROTONIX) 40 MG tablet Take 1 tablet (40 mg total) by mouth daily. Patient taking differently: Take 40 mg by mouth daily as needed (GERD).  07/15/18   Plotnikov, Evie Lacks, MD  rOPINIRole (REQUIP) 4 MG tablet Take 1 tablet (4 mg total) by mouth 3 (three) times daily. 09/13/18   Plotnikov, Evie Lacks, MD  torsemide [facility])  20 MG tablet TAKE 1-2 TABLETS BY MOUTH DAILY AS NEEDED FOR SWELLING Patient taking differently: Take 20 mg by mouth 2 (two) times daily as needed (swelling).  03/10/18   Plotnikov, Evie Lacks, MD    Family History Family History  Problem Relation Age of Onset   Ovarian cancer Mother    Diabetes Mother    COPD Father    Heart disease Maternal Grandmother    Clotting disorder Other        Father, brother, sister, and daughter all had deep venous thrombosis   Breast cancer Other    Pancreatic cancer Paternal Uncle    Colon cancer Neg Hx     Social History Social History   Tobacco Use   Smoking status: Never Smoker   Smokeless tobacco: Never Used   Tobacco comment: She describes COPD related to secondhand exposure from her family and husband  Substance Use Topics   Alcohol use: Yes    Comment: occ    Drug use: Never     Allergies   Acetaminophen, Codeine, Pramipexole dihydrochloride, Rofecoxib, Venlafaxine, Arava [leflunomide], Clonazepam, Diclofenac sodium, Alprazolam, Darvocet [propoxyphene n-acetaminophen], Duloxetine, Gabapentin, Latex, Morphine and related, Nortriptyline hcl, Penicillins, and Prednisone   Review of Systems Review of Systems  Constitutional: Negative for appetite change and fatigue.  HENT: Negative for congestion, ear discharge and sinus pressure.        Headache    Eyes: Negative for discharge.  Respiratory: Negative for cough.   Cardiovascular: Negative for chest pain.  Gastrointestinal: Negative for abdominal pain and diarrhea.  Genitourinary: Negative for frequency and hematuria.  Musculoskeletal: Negative for back pain.       Right shoulder pain  Skin: Negative for rash.  Neurological: Negative for seizures and headaches.  Psychiatric/Behavioral: Negative for hallucinations.     Physical Exam Updated Vital Signs BP (!) 142/70    Pulse 83    Temp 97.6 F (36.4 C) (Oral)    Resp 16    Ht 5' (1.524 m)    Wt 77.1 kg    SpO2 95%    BMI 33.20 kg/m   Physical Exam Vitals signs and nursing note reviewed.  Constitutional:      Appearance: She is well-developed.  HENT:     Head: Normocephalic.     Comments: Abrasion to forehead with swelling and tenderness    Nose: Nose normal.  Eyes:     General: No scleral icterus.    Conjunctiva/sclera: Conjunctivae normal.  Neck:     Musculoskeletal: Neck supple.     Thyroid: No thyromegaly.  Cardiovascular:     Rate and Rhythm: Normal rate and regular rhythm.     Heart sounds: No murmur. No friction rub. No gallop.   Pulmonary:     Breath sounds: No stridor. No wheezing or rales.  Chest:     Chest wall: No tenderness.  Abdominal:     General: There is no distension.     Tenderness: There is no abdominal tenderness. There is no rebound.  Musculoskeletal: Normal range of motion.     Comments: Bilateral tenderness to shoulders  Lymphadenopathy:     Cervical: No cervical adenopathy.  Skin:    Findings: No erythema or rash.  Neurological:     Mental Status: She is oriented to person, place, and time.     Motor: No abnormal muscle tone.     Coordination: Coordination normal.  Psychiatric:        Behavior: Behavior normal.  ED Treatments / Results  Labs (all labs ordered are listed, but only abnormal results are displayed) Labs Reviewed - No data to  display  EKG None  Radiology Dg Shoulder Right  Result Date: 03/13/2019 CLINICAL DATA:  Right shoulder pain following a fall. EXAM: RIGHT SHOULDER - 2+ VIEW COMPARISON:  05/30/2005 FINDINGS: Mild greater tuberosity hyperostosis. Moderate superior acromioclavicular spur formation and superior subluxation of the distal clavicle relative to the acromion, not previously present. Normal coracoclavicular distance. Right axillary surgical clips. IMPRESSION: 1. Interval superior subluxation of the distal clavicle relative to the acromion. Otherwise, no fracture or dislocation seen. 2. Moderate superior spur formation arising from the acromion. 3. Greater tuberosity hyperostosis. This can be seen with chronic rotator cuff tendinosis. Electronically Signed   By: Claudie Revering M.D.   On: 03/13/2019 12:55   Ct Head Wo Contrast  Result Date: 03/13/2019 CLINICAL DATA:  Head trauma, fall on marble floor with right frontal hematoma. EXAM: CT HEAD WITHOUT CONTRAST CT CERVICAL SPINE WITHOUT CONTRAST TECHNIQUE: Multidetector CT imaging of the head and cervical spine was performed following the standard protocol without intravenous contrast. Multiplanar CT image reconstructions of the cervical spine were also generated. COMPARISON:  10/17/2018 head and cervical spine CT. FINDINGS: CT HEAD FINDINGS Brain: No evidence of parenchymal hemorrhage or extra-axial fluid collection. No mass lesion, mass effect, or midline shift. No CT evidence of acute infarction. Nonspecific minimal subcortical and periventricular white matter hypodensity, most in keeping with chronic small vessel ischemic change. Cerebral volume is age appropriate. No ventriculomegaly. Vascular: No acute abnormality. Skull: No evidence of calvarial fracture. Large anterior right frontal scalp hematoma. Sinuses/Orbits: The visualized paranasal sinuses are essentially clear. Other:  The mastoid air cells are unopacified. CT CERVICAL SPINE FINDINGS Alignment: Normal  cervical lordosis. No facet subluxation. Dens is well positioned between the lateral masses of C1. Chronic minimal 2 mm anterolisthesis C3-4. Skull base and vertebrae: No acute fracture. Chronic moderate anterior T3 vertebral compression fracture is unchanged. No primary bone lesion or focal pathologic process. Soft tissues and spinal canal: No prevertebral edema. No visible canal hematoma. Disc levels: Marked multilevel degenerative disc disease throughout the cervical spine, most prominent C3-4 and C4-5, unchanged. Moderate to severe multilevel facet arthropathy bilaterally. Mild degenerative foraminal stenosis on the right at C2-3 and C3-4 and bilaterally at C4-5 and C5-6. Upper chest: No acute abnormality. Other: Visualized mastoid air cells appear clear. No discrete thyroid nodules. No pathologically enlarged cervical nodes. IMPRESSION: 1. Large right frontal scalp hematoma. No evidence of acute intracranial abnormality. No evidence of calvarial fracture. 2. Minimal chronic small vessel ischemic changes in the cerebral white matter. 3. No cervical spine fracture or facet subluxation. 4. Advanced multilevel degenerative changes in the cervical spine as detailed. 5. Chronic moderate T3 vertebral compression fracture. Electronically Signed   By: Ilona Sorrel M.D.   On: 03/13/2019 12:51   Ct Cervical Spine Wo Contrast  Result Date: 03/13/2019 CLINICAL DATA:  Head trauma, fall on marble floor with right frontal hematoma. EXAM: CT HEAD WITHOUT CONTRAST CT CERVICAL SPINE WITHOUT CONTRAST TECHNIQUE: Multidetector CT imaging of the head and cervical spine was performed following the standard protocol without intravenous contrast. Multiplanar CT image reconstructions of the cervical spine were also generated. COMPARISON:  10/17/2018 head and cervical spine CT. FINDINGS: CT HEAD FINDINGS Brain: No evidence of parenchymal hemorrhage or extra-axial fluid collection. No mass lesion, mass effect, or midline shift. No CT  evidence of acute infarction. Nonspecific minimal subcortical and periventricular white matter  hypodensity, most in keeping with chronic small vessel ischemic change. Cerebral volume is age appropriate. No ventriculomegaly. Vascular: No acute abnormality. Skull: No evidence of calvarial fracture. Large anterior right frontal scalp hematoma. Sinuses/Orbits: The visualized paranasal sinuses are essentially clear. Other:  The mastoid air cells are unopacified. CT CERVICAL SPINE FINDINGS Alignment: Normal cervical lordosis. No facet subluxation. Dens is well positioned between the lateral masses of C1. Chronic minimal 2 mm anterolisthesis C3-4. Skull base and vertebrae: No acute fracture. Chronic moderate anterior T3 vertebral compression fracture is unchanged. No primary bone lesion or focal pathologic process. Soft tissues and spinal canal: No prevertebral edema. No visible canal hematoma. Disc levels: Marked multilevel degenerative disc disease throughout the cervical spine, most prominent C3-4 and C4-5, unchanged. Moderate to severe multilevel facet arthropathy bilaterally. Mild degenerative foraminal stenosis on the right at C2-3 and C3-4 and bilaterally at C4-5 and C5-6. Upper chest: No acute abnormality. Other: Visualized mastoid air cells appear clear. No discrete thyroid nodules. No pathologically enlarged cervical nodes. IMPRESSION: 1. Large right frontal scalp hematoma. No evidence of acute intracranial abnormality. No evidence of calvarial fracture. 2. Minimal chronic small vessel ischemic changes in the cerebral white matter. 3. No cervical spine fracture or facet subluxation. 4. Advanced multilevel degenerative changes in the cervical spine as detailed. 5. Chronic moderate T3 vertebral compression fracture. Electronically Signed   By: Ilona Sorrel M.D.   On: 03/13/2019 12:51   Dg Shoulder Left  Result Date: 03/13/2019 CLINICAL DATA:  Left shoulder pain following a fall. EXAM: LEFT SHOULDER - 2+ VIEW  COMPARISON:  12/07/2011 FINDINGS: Stable left shoulder prosthesis. No fracture or dislocation. Mediastinal surgical clips and prosthetic aortic valve. IMPRESSION: Stable left shoulder prosthesis without fracture or dislocation. Electronically Signed   By: Claudie Revering M.D.   On: 03/13/2019 12:57    Procedures Procedures (including critical care time)  Medications Ordered in ED Medications  fentaNYL (SUBLIMAZE) injection 50 mcg (50 mcg Intravenous Given 03/13/19 1219)  HYDROmorphone (DILAUDID) injection 0.5 mg (0.5 mg Intravenous Given 03/13/19 1308)  HYDROmorphone (DILAUDID) injection 0.5 mg (0.5 mg Intravenous Given 03/13/19 1402)     Initial Impression / Assessment and Plan / ED Course  I have reviewed the triage vital signs and the nursing notes.  Pertinent labs & imaging results that were available during my care of the patient were reviewed by me and considered in my medical decision making (see chart for details).        CT scan of head and cervical spine unremarkable.  X-ray right shoulder showed distal clavicle separation.  Patient will be discharged home with pain meds and will follow up with her PCP for recheck of shoulder and forehead abrasion Final Clinical Impressions(s) / ED Diagnoses   Final diagnoses:  Injury of head, initial encounter    ED Discharge Orders         Ordered    oxyCODONE (ROXICODONE) 5 MG immediate release tablet  Every 6 hours PRN     03/13/19 1450           Milton Ferguson, MD 03/13/19 1455

## 2019-03-13 NOTE — ED Notes (Signed)
Pt awaiting family to come pick up pt

## 2019-03-13 NOTE — Discharge Instructions (Addendum)
Follow-up with your family doctor in 2 to 3 days for recheck.  Clean abrasion to forehead twice a day with soap and water

## 2019-03-13 NOTE — ED Notes (Signed)
Pt unable to sign d/c paper d/t pain in shoulder

## 2019-03-13 NOTE — ED Triage Notes (Addendum)
Pt tripped and fell while coming from her door.Fell on [employer]. Reports shoulder and arm pain Denies LOC. Noted to have large hematoma to right forehead. Pt was given 50 mcg of fentanyl IV en route

## 2019-03-15 ENCOUNTER — Ambulatory Visit (INDEPENDENT_AMBULATORY_CARE_PROVIDER_SITE_OTHER): Payer: PPO | Admitting: Internal Medicine

## 2019-03-15 ENCOUNTER — Ambulatory Visit: Payer: PPO | Admitting: Interventional Cardiology

## 2019-03-15 ENCOUNTER — Encounter: Payer: Self-pay | Admitting: Internal Medicine

## 2019-03-15 DIAGNOSIS — S0083XD Contusion of other part of head, subsequent encounter: Secondary | ICD-10-CM | POA: Diagnosis not present

## 2019-03-15 DIAGNOSIS — R110 Nausea: Secondary | ICD-10-CM | POA: Diagnosis not present

## 2019-03-15 DIAGNOSIS — Z86711 Personal history of pulmonary embolism: Secondary | ICD-10-CM

## 2019-03-15 DIAGNOSIS — S0083XA Contusion of other part of head, initial encounter: Secondary | ICD-10-CM | POA: Insufficient documentation

## 2019-03-15 MED ORDER — PROMETHAZINE HCL 12.5 MG PO TABS: 12.5000 mg | ORAL_TABLET | Freq: Four times a day (QID) | ORAL | 0 refills | Status: DC | PRN

## 2019-03-15 MED ORDER — ENOXAPARIN SODIUM 30 MG/0.3ML ~~LOC~~ SOLN: 30.0000 mg | Freq: Two times a day (BID) | SUBCUTANEOUS | 0 refills | Status: DC

## 2019-03-15 MED ORDER — BREO ELLIPTA 100-25 MCG/INH IN AEPB: INHALATION_SPRAY | RESPIRATORY_TRACT | 11 refills | Status: DC

## 2019-03-15 NOTE — Assessment & Plan Note (Signed)
Lovenox for DVT prophylaxis while she is bedridden.  Prescription emailed

## 2019-03-15 NOTE — Progress Notes (Signed)
Virtual Visit via Telephone Note  I connected with Diane Abbott on 03/15/19 at  9:30 AM EST by telephone and verified that I am speaking with the correct person using two identifiers.   I discussed the limitations, risks, security and privacy concerns of performing an evaluation and management service by telephone and the availability of in person appointments. I also discussed with the patient that there may be a patient responsible charge related to this service. The patient expressed understanding and agreed to proceed.   History of Present Illness:   This is a follow-up on Diane Abbott's fall on 11/9.  She tripped over her cane and fell in the kitchen.  She went to [facility] and was treated by Dr. Roderic Palau.  Her head and neck CT were negative.  She was prescribed oxycodone.  She is complaining of bruises on her face.  She has to stay in bed.  She is worried about blood clots due to her immobility.  She is complaining of nausea.  Hospital notes and tests reviewed Observations/Objective:  The patient sounds tired on the phone Assessment and Plan: See plan  Follow Up Instructions:    I discussed the assessment and treatment plan with the patient. The patient was provided an opportunity to ask questions and all were answered. The patient agreed with the plan and demonstrated an understanding of the instructions.   The patient was advised to call back or seek an in-person evaluation if the symptoms worsen or if the condition fails to improve as anticipated.  I provided 22 minutes of non-face-to-face time during this encounter.   Walker Kehr, MD

## 2019-03-15 NOTE — Assessment & Plan Note (Signed)
status post fall in the kitchen Oxycodone as needed.  Phenergan as needed nausea CTA head/neck negative

## 2019-03-15 NOTE — Assessment & Plan Note (Signed)
Promethazine as needed. 

## 2019-03-16 ENCOUNTER — Telehealth: Payer: Self-pay | Admitting: Rheumatology

## 2019-03-16 NOTE — Telephone Encounter (Signed)
Left patient a message. Advised she could stop by the office to pick up an application, we can mail her an application, or she can print one online and send to the office for submission. Advised patient to call back with any questions.  11:30 AM Beatriz Chancellor, CPhT

## 2019-03-16 NOTE — Telephone Encounter (Signed)
Patient called requesting a return call to discuss signing up for the patient assistance program for her prescription of Enbrel.

## 2019-03-22 ENCOUNTER — Ambulatory Visit: Payer: PPO

## 2019-03-22 ENCOUNTER — Other Ambulatory Visit: Payer: Self-pay

## 2019-03-22 NOTE — Patient Outreach (Signed)
[facility]) Care Management  03/22/2019  Diane Abbott 1934/12/16 [id number]   Social work referral received on 03/20/19 from [facility].  Per referral, patient needing assistance with ADL's, as well as food and financial resources.  Has difficulty preparing meals.   Per referral, patient also seeking assistance with cost of medications and was referred to [facility] for this.  Unsuccessful outreach to patient today.  Left voicemail message and mailed unsuccessful outreach letter.  Will attempt to reach again within four business days.   Ronn Melena, BSW Social Worker [phone]

## 2019-03-24 ENCOUNTER — Other Ambulatory Visit: Payer: Self-pay

## 2019-03-24 ENCOUNTER — Ambulatory Visit: Payer: Self-pay

## 2019-03-24 NOTE — Patient Outreach (Signed)
[facility]) Care Management  03/24/2019  EDELLE SCHLEY 1934/08/15 [id number]   Social work referral received on 03/20/19 from [facility].  Per referral, patient needing assistance with ADL's, as well as food and financial resources.  Has difficulty preparing meals.   Per referral, patient also seeking assistance with cost of medications and was referred to [facility] for this.  Second unsuccessful outreach to patient today.  Left voicemail message.  Mailed unsuccessful outreach letter on 03/22/19.    Will attempt to reach again within four business days if no return call.   Ronn Melena, BSW Social Worker [phone]

## 2019-03-28 ENCOUNTER — Other Ambulatory Visit: Payer: Self-pay

## 2019-03-28 ENCOUNTER — Ambulatory Visit: Payer: Self-pay

## 2019-03-28 NOTE — Patient Outreach (Signed)
[facility]) Care Management  03/28/2019  Diane Abbott 1935/01/25 [id number]   Social work referral received on 03/20/19 from [facility]. Per referral, patient needing assistance with ADL's, as well as food and financial resources. Has difficulty preparing meals.  Per referral, patient also seeking assistance with cost of medications and was referred to [facility] for this.  Third unsuccessful outreach to patient today. Left voicemail message.  Mailed unsuccessful outreach letter on 03/22/19.   Will close case if no response to letter or voicemail messages by 04/06/19.     Ronn Melena, BSW Social Worker [phone]

## 2019-04-06 ENCOUNTER — Other Ambulatory Visit: Payer: Self-pay

## 2019-04-06 ENCOUNTER — Telehealth: Payer: Self-pay | Admitting: Pharmacy Technician

## 2019-04-06 NOTE — Telephone Encounter (Signed)
Submitted a Prior Authorization request to [place] for ENBREL   via phone . Will update once we receive a response.  Will fax document to [employer] for patient's PAP application once determination is received

## 2019-04-06 NOTE — Progress Notes (Signed)
CARDIOLOGY OFFICE NOTE  Date:  04/10/2019    Diane Abbott Date of Birth: 06-29-1934 Medical Record [id number]  PCP:  Cassandria Anger, MD  Cardiologist:  Tamala Julian  Chief Complaint  Patient presents with   Follow-up    Seen for Dr. Tamala Julian    History of Present Illness: Diane Abbott is a 84 y.o. female who presents today for an 8 month check. Seen for Dr. Tamala Julian. Previously seen by Dr. Aundra Dubin.   She has a history of OA, RA, PE/DVT, diastolic HF, and status post Bentall bioprosthetic aortic valve conduit and ascending aorta/proximal arch replacement in 2014.  She has also had replacement of her aneurysmal aortic arch with aorto-innominate and aorto-left carotid artery bypasson 06/29/2016.She is no longer on anticoagulation - now just aspirin.   She had a telehealth visit with Dr. Tamala Julian back in April - she had been referred back to cardiology and saw Dr. Tamala Julian - previously seen by Dr. Aundra Dubin - she endorsed several month history of swelling - seems chronic. No dramatic worsening in her breathing. Dr. Tamala Julian wanted to see her in the office and update her echo - she did not wish to come out due to the ongoing pandemic. Echo was updated in August. This was reassuring.   The patient does not have symptoms concerning for COVID-19 infection (fever, chills, cough, or new shortness of breath).   Comes in today. Here alone. She had a fall a few weeks ago (got tripped up on her cane and fell on a marble floor) - she was given Lovenox while she was bedridden. She notes she has frequent falls. She still has swelling in her legs - loves salt. No real intent on stopping. Has had prior DVT as well. She is pretty limited/sedentary due to chronic knee pain. She had her echo back in August - this was ok. Valves ok. She had a prior BNP - only 200. She uses Demedex as needed.  Sits with her legs down as well. Tells me that this is how her legs always look. Notes no worsening in her breathing.    Past Medical History:  Diagnosis Date   Adrenal insufficiency ([place])    Anemia    Anginal pain ([place])    "comes and goes has occured since 2012"   Blood dyscrasia    "problems with platelets, red cells and white cells being low"   Bronchitis Jan 2013-March 2013   severe   Cancer [facility])    skin cancer on face   Cataracts, bilateral    more in left than right   Depression    Esophagitis    Fatty liver    Fibromyalgia    Fibromyalgia    Foot pain, left 08/26/2016   2/18 1st MTP - gout vs other   GERD (gastroesophageal reflux disease)    Heart murmur    History of blood clots 2008   below knee post trauma. Dilated [facility] no clotting disorder felt to be present.   History of echocardiogram    Echo 11/16: Mild LVH, EF 60-65%, normal wall motion, grade 1 diastolic dysfunction, bioprosthetic AVR with mean gradient 21 mmHg and no regurgitation, proximal aortic arch aneurysm 49 mm, moderate TR, PASP 31 mmHg   IBS (irritable bowel syndrome)    Leaky heart valve    Osteoarthritis    Osteoarthritis of left shoulder 12/07/2011   Osteopenia    Pulmonary embolism (HCC)    RA (rheumatoid arthritis) ([place])  Shortness of breath    attributed to aneurysm   Sliding hiatal hernia    Urinary leakage    when coughing   Vertigo    hx of   Vitamin B 12 deficiency    Vitamin D deficiency     Past Surgical History:  Procedure Laterality Date   ABDOMINAL HYSTERECTOMY     AORTA SURGERY  12/2012   Resection of aneurysm and valve replacement   APPENDECTOMY  1956   BENTALL PROCEDURE N/A 09/29/2012   Procedure: BENTALL PROCEDURE;  Surgeon: Gaye Pollack, MD;  Location: [facility];  Service: Open Heart Surgery;  Laterality: N/A;  WITH CIRC ARREST   BILATERAL OOPHORECTOMY     BREAST BIOPSY Left    CARDIAC CATHETERIZATION  09/15/12   no PCI   CATARACT EXTRACTION Bilateral    CHOLECYSTECTOMY  2003   COLONOSCOPY     COSMETIC SURGERY     Tummy tuck    INTRAOPERATIVE TRANSESOPHAGEAL ECHOCARDIOGRAM N/A 09/29/2012   Procedure: INTRAOPERATIVE TRANSESOPHAGEAL ECHOCARDIOGRAM;  Surgeon: Gaye Pollack, MD;  Location: [facility];  Service: Open Heart Surgery;  Laterality: N/A;   JOINT REPLACEMENT Bilateral    REPLACEMENT ASCENDING AORTA N/A 06/29/2016   Procedure: AORTIC ARCH REPLACEMENT WITH CIRC ARREST (N/A) - RIGHT AXILLARY CANNULATION  AORTO-AORTIC 28 HEMASHIELD GRAFT WITH AORTO-INNOMINATE AND AORTO-LEFT CAROTID ANASTAMOSIS;  Surgeon: Gaye Pollack, MD;  Location: [facility];  Service: Open Heart Surgery;  Laterality: N/A;  RIGHT AXILLARY CANNULATION  BILATERAL RADIAL A-LINE   SKIN CANCER EXCISION Right    cheek   TEE WITHOUT CARDIOVERSION N/A 06/29/2016   Procedure: TRANSESOPHAGEAL ECHOCARDIOGRAM (TEE);  Surgeon: Gaye Pollack, MD;  Location: [facility];  Service: Open Heart Surgery;  Laterality: N/A;   TONSILLECTOMY  1941   TOTAL KNEE ARTHROPLASTY     bilateral   TOTAL SHOULDER ARTHROPLASTY  12/07/2011   Procedure: TOTAL SHOULDER ARTHROPLASTY;  Surgeon: Johnny Bridge, MD;  Location: [facility];  Service: Orthopedics;  Laterality: Left;  left total shoulder artthroplasty   WRIST SURGERY     bilateral     Medications: Current Meds  Medication Sig   pantoprazole (PROTONIX) 40 MG tablet Take 40 mg by mouth daily as needed (GERD).   torsemide (DEMADEX) 20 MG tablet Take 40 mg by mouth 2 (two) times daily as needed (Swelling).      Allergies: Allergies  Allergen Reactions   Acetaminophen     Eyes dilate; pt states "can't see"   Codeine Other (See Comments)    Blood pressure drops; "I pass out."    Pramipexole Dihydrochloride Nausea Only    sick, falling   Rofecoxib Other (See Comments)    "sleep walks"   Venlafaxine Other (See Comments)    migraines   Arava [Leflunomide] Swelling   Clonazepam     Unknown; pt can't remember   Diclofenac Sodium     Unknown; pt can't remember   Alprazolam Other (See Comments)    "Makes me mean"     Darvocet [Propoxyphene N-Acetaminophen] Other (See Comments)    "makes my eyes dilate. Pt stated I can't see"   Duloxetine Other (See Comments)    achy legs   Gabapentin Other (See Comments)    headache   Latex Itching and Dermatitis    When examined with latex gloves, burns and itches in contact areas per pt.   Morphine And Related Other (See Comments)    Hallucinations    Nortriptyline Hcl Other (See Comments)  Migraines    Penicillins Rash    Has patient had a PCN reaction causing immediate rash, facial/tongue/throat swelling, SOB or lightheadedness with hypotension: Yes Has patient had a PCN reaction causing severe rash involving mucus membranes or skin necrosis: Yes Has patient had a PCN reaction that required hospitalization No Has patient had a PCN reaction occurring within the last 10 years: Yes If all of the above answers are "NO", then may proceed with Cephalosporin use.   Can take Cephalosporins   Prednisone Swelling    Just doesn't want to take it.    Social History: The patient  reports that she has never smoked. She has never used smokeless tobacco. She reports current alcohol use. She reports that she does not use drugs.   Family History: The patient's family history includes Breast cancer in an other family member; COPD in her father; Clotting disorder in an other family member; Diabetes in her mother; Heart disease in her maternal grandmother; Ovarian cancer in her mother; Pancreatic cancer in her paternal uncle.   Review of Systems: Please see the history of present illness.   All other systems are reviewed and negative.   Physical Exam: VS:  BP 130/72    Pulse 71    Ht 5' (1.524 m)    Wt 167 lb (75.8 kg)    SpO2 99%    BMI 32.61 kg/m  .  BMI Body mass index is 32.61 kg/m.  Wt Readings from Last 3 Encounters:  04/10/19 167 lb (75.8 kg)  03/13/19 170 lb (77.1 kg)  02/08/19 170 lb (77.1 kg)    General: Alert. Elderly. Looks frail and in no  acute distress.  HEENT: Normal. She has a resolving laceration/bruise over the right forehead/eye with steri strips in place.  Neck: Supple, no JVD, carotid bruits, or masses noted.  Cardiac: Regular rate and rhythm. Harsh grating murmur noted. She does have marked edema bilaterally.  Respiratory:  Lungs are clear to auscultation bilaterally with normal work of breathing.  GI: Soft and nontender.  MS: No deformity or atrophy. Gait and ROM intact. She is using a walker.  Skin: Warm and dry. Color is normal.  Neuro:  Strength and sensation are intact and no gross focal deficits noted.  Psych: Alert, appropriate and with normal affect.   LABORATORY DATA:  EKG:  EKG is not ordered today..  Lab Results  Component Value Date   WBC 3.4 (L) 02/08/2019   HGB 12.0 02/08/2019   HCT 35.6 02/08/2019   PLT 117 (L) 02/08/2019   GLUCOSE 95 02/08/2019   ALT 10 02/08/2019   AST 19 02/08/2019   NA 139 02/08/2019   K 4.8 02/08/2019   CL 107 02/08/2019   CREATININE 1.15 (H) 02/08/2019   BUN 26 (H) 02/08/2019   CO2 25 02/08/2019   TSH 3.95 04/08/2018   INR 1.48 06/29/2016   HGBA1C 5.5 06/24/2016     BNP (last 3 results) No results for input(s): BNP in the last 8760 hours.  ProBNP (last 3 results) No results for input(s): PROBNP in the last 8760 hours.   Other Studies Reviewed Today:  ECHO IMPRESSIONS 12/2018   1. The left ventricle has normal systolic function with an ejection fraction of 60-65%. The cavity size was normal. Left ventricular diastolic Doppler parameters are consistent with pseudonormalization.  2. The right ventricle has normal systolic function. The cavity was normal. There is no increase in right ventricular wall thickness.  3. No evidence of mitral valve stenosis.  4. No stenosis of the aortic valve.  5. The aorta is normal unless otherwise noted.  6. The aortic root and ascending aorta are normal in size and structure.  7. The atrial septum is grossly  normal.     ASSESSMENT & PLAN:    1. Chronic diastolic HF -most recent echo was basically stable. I suspect a lot of her swelling is multifactorial - she has had prior DVT in the legs, she uses too much salt, sits with her legs down, etc. Suspect some degree of venous insufficiency. On Demedex. Her swelling seems to be chronic and she says it is unchanged. I doubt there is much to do. Breathing is stable.   2. Thoracic aneurysm with prior surgical intervention - followed by Dr. Cyndia Bent.   3. CKD - labs from October noted.   4. Emphysema/COPD - breathing seems stable.   5. Prior PE - only on aspirin - does take Lovenox in a setting of being bedridden - she did this last month following a fall. Past note from 2016 notes she was on Xarelto in the past and this was stopped by heme/onc.   6. Prior AVR - most recent echo noted and was stable.   7. Advancing age - she seems to be in a general decline to me. Seems to be holding her own.   8. COVID-19 Education: The signs and symptoms of COVID-19 were discussed with the patient and how to seek care for testing (follow up with PCP or arrange E-visit).  The importance of social distancing, staying at home, hand hygiene and wearing a mask when out in public were discussed today.  Current medicines are reviewed with the patient today.  The patient does not have concerns regarding medicines other than what has been noted above.  The following changes have been made:  See above.  Labs/ tests ordered today include:   No orders of the defined types were placed in this encounter.    Disposition:   FU with [place] in about 4 months.   Patient is agreeable to this plan and will call if any problems develop in the interim.   SignedTruitt Merle, NP  04/10/2019 3:37 PM  [facility] [address] [place], [place]  [address] Phone: [phone] Fax: [phone]

## 2019-04-06 NOTE — Patient Outreach (Signed)
[facility]) Care Management  04/06/2019  Diane Abbott 1934/08/11 [id number]   [facility] case closure due to inability to contact.  Ronn Melena, BSW Social Worker [phone]

## 2019-04-10 ENCOUNTER — Other Ambulatory Visit: Payer: Self-pay

## 2019-04-10 ENCOUNTER — Encounter: Payer: Self-pay | Admitting: Nurse Practitioner

## 2019-04-10 ENCOUNTER — Ambulatory Visit (INDEPENDENT_AMBULATORY_CARE_PROVIDER_SITE_OTHER): Payer: PPO | Admitting: Nurse Practitioner

## 2019-04-10 VITALS — BP 130/72 | HR 71 | Ht 60.0 in | Wt 167.0 lb

## 2019-04-10 DIAGNOSIS — I5032 Chronic diastolic (congestive) heart failure: Secondary | ICD-10-CM | POA: Diagnosis not present

## 2019-04-10 DIAGNOSIS — R600 Localized edema: Secondary | ICD-10-CM | POA: Diagnosis not present

## 2019-04-10 DIAGNOSIS — Z952 Presence of prosthetic heart valve: Secondary | ICD-10-CM

## 2019-04-10 DIAGNOSIS — I712 Thoracic aortic aneurysm, without rupture, unspecified: Secondary | ICD-10-CM

## 2019-04-10 DIAGNOSIS — Z7189 Other specified counseling: Secondary | ICD-10-CM

## 2019-04-10 DIAGNOSIS — J431 Panlobular emphysema: Secondary | ICD-10-CM

## 2019-04-10 NOTE — Patient Instructions (Signed)
After Visit Summary:  We will be checking the following labs today - NONE   Medication Instructions:    Continue with your current medicines.    If you need a refill on your cardiac medications before your next appointment, please call your pharmacy.     Testing/Procedures To Be Arranged:  N/A  Follow-Up:   See Dr. Smith in 4 months.     At [facility], you and your health needs are our priority.  As part of our continuing mission to provide you with exceptional heart care, we have created designated Provider Care Teams.  These Care Teams include your primary Cardiologist (physician) and Advanced Practice Providers (APPs -  Physician Assistants and Nurse Practitioners) who all work together to provide you with the care you need, when you need it.  Special Instructions:  . Stay safe, stay home, wash your hands for at least 20 seconds and wear a mask when out in public.  . It was good to talk with you today.    Call the [facility] office at [phone] if you have any questions, problems or concerns.       

## 2019-04-10 NOTE — Telephone Encounter (Signed)
Received notification from [facility] regarding a prior authorization for ENBREL. Authorization has been APPROVED from 04/07/19 to 05/03/20.   Will send document to scan center. Sending to [facility] for PAP renewal

## 2019-04-11 ENCOUNTER — Ambulatory Visit (INDEPENDENT_AMBULATORY_CARE_PROVIDER_SITE_OTHER): Payer: PPO | Admitting: Internal Medicine

## 2019-04-11 ENCOUNTER — Encounter: Payer: Self-pay | Admitting: Internal Medicine

## 2019-04-11 DIAGNOSIS — R3915 Urgency of urination: Secondary | ICD-10-CM

## 2019-04-11 DIAGNOSIS — G2581 Restless legs syndrome: Secondary | ICD-10-CM | POA: Diagnosis not present

## 2019-04-11 DIAGNOSIS — M0609 Rheumatoid arthritis without rheumatoid factor, multiple sites: Secondary | ICD-10-CM | POA: Diagnosis not present

## 2019-04-11 DIAGNOSIS — S0181XS Laceration without foreign body of other part of head, sequela: Secondary | ICD-10-CM

## 2019-04-11 DIAGNOSIS — S0181XA Laceration without foreign body of other part of head, initial encounter: Secondary | ICD-10-CM | POA: Insufficient documentation

## 2019-04-11 DIAGNOSIS — L659 Nonscarring hair loss, unspecified: Secondary | ICD-10-CM

## 2019-04-11 MED ORDER — FINASTERIDE 5 MG PO TABS: ORAL_TABLET | ORAL | 3 refills | Status: DC

## 2019-04-11 NOTE — Assessment & Plan Note (Signed)
UA

## 2019-04-11 NOTE — Assessment & Plan Note (Signed)
Requip  ?

## 2019-04-11 NOTE — Assessment & Plan Note (Signed)
Proscar 1/4 tab qd - 12/20

## 2019-04-11 NOTE — Assessment & Plan Note (Signed)
A walker Rx

## 2019-04-11 NOTE — Assessment & Plan Note (Signed)
Diane Abbott

## 2019-04-11 NOTE — Progress Notes (Signed)
Subjective:  Patient ID: Diane Abbott, female    DOB: 07-Feb-1935  Age: 84 y.o. MRN: [id number]  CC: No chief complaint on file.   HPI Diane Abbott presents for post ER visit on 03/13/19 - R forehead laceration C/o hair loss, CHF, gait issues  Outpatient Medications Prior to Visit  Medication Sig Dispense Refill   aspirin 81 MG tablet Take 1 tablet (81 mg total) by mouth daily.     etanercept (ENBREL SURECLICK) 50 MG/ML injection Inject 0.98 mLs (50 mg total) into the skin every 14 (fourteen) days. 6 pen 0   fluticasone furoate-vilanterol (BREO ELLIPTA) 100-25 MCG/INH AEPB INHALE 1 PUFF BY MOUTH ONCE DAILY 60 each 11   pantoprazole (PROTONIX) 40 MG tablet Take 40 mg by mouth daily as needed (GERD).     rOPINIRole (REQUIP) 4 MG tablet Take 1 tablet (4 mg total) by mouth 3 (three) times daily. 90 tablet 11   torsemide (DEMADEX) 20 MG tablet Take 40 mg by mouth 2 (two) times daily as needed (Swelling).      promethazine (PHENERGAN) 12.5 MG tablet Take 1-2 tablets (12.5-25 mg total) by mouth every 6 (six) hours as needed for up to 7 days for nausea or vomiting. 60 tablet 0   No facility-administered medications prior to visit.     ROS: Review of Systems  Constitutional: Positive for fatigue. Negative for activity change, appetite change, chills and unexpected weight change.  HENT: Negative for congestion, mouth sores and sinus pressure.   Eyes: Negative for visual disturbance.  Respiratory: Negative for cough and chest tightness.   Cardiovascular: Positive for palpitations.  Gastrointestinal: Negative for abdominal pain and nausea.  Genitourinary: Negative for difficulty urinating, frequency and vaginal pain.  Musculoskeletal: Positive for arthralgias and gait problem. Negative for back pain.  Skin: Positive for wound. Negative for pallor and rash.  Neurological: Negative for dizziness, tremors, weakness, numbness and headaches.  Psychiatric/Behavioral: Negative for  confusion and sleep disturbance.    Objective:  BP 134/72 (BP Location: Left Arm, Patient Position: Sitting, Cuff Size: Normal)    Pulse 93    Temp 97.7 F (36.5 C) (Oral)    Ht 5' (1.524 m)    Wt 166 lb (75.3 kg)    SpO2 98%    BMI 32.42 kg/m   BP Readings from Last 3 Encounters:  04/11/19 134/72  04/10/19 130/72  03/13/19 133/69    Wt Readings from Last 3 Encounters:  04/11/19 166 lb (75.3 kg)  04/10/19 167 lb (75.8 kg)  03/13/19 170 lb (77.1 kg)    Physical Exam Constitutional:      General: She is not in acute distress.    Appearance: She is well-developed.  HENT:     Head: Normocephalic.     Right Ear: External ear normal.     Left Ear: External ear normal.     Nose: Nose normal.  Eyes:     General:        Right eye: No discharge.        Left eye: No discharge.     Conjunctiva/sclera: Conjunctivae normal.     Pupils: Pupils are equal, round, and reactive to light.  Neck:     Musculoskeletal: Normal range of motion and neck supple.     Thyroid: No thyromegaly.     Vascular: No JVD.     Trachea: No tracheal deviation.  Cardiovascular:     Rate and Rhythm: Normal rate and regular rhythm.  Heart sounds: Normal heart sounds.  Pulmonary:     Effort: No respiratory distress.     Breath sounds: No stridor. No wheezing.  Abdominal:     General: Bowel sounds are normal. There is no distension.     Palpations: Abdomen is soft. There is no mass.     Tenderness: There is no abdominal tenderness. There is no guarding or rebound.  Musculoskeletal:        General: Tenderness present.  Lymphadenopathy:     Cervical: No cervical adenopathy.  Skin:    Findings: No erythema or rash.  Neurological:     Mental Status: She is oriented to person, place, and time.     Cranial Nerves: No cranial nerve deficit.     Motor: Weakness present. No abnormal muscle tone.     Coordination: Coordination abnormal.     Gait: Gait abnormal.     Deep Tendon Reflexes: Reflexes normal.    Psychiatric:        Behavior: Behavior normal.        Thought Content: Thought content normal.        Judgment: Judgment normal.   cane   R forehead laceration - steri-strips removed - healed  Lab Results  Component Value Date   WBC 3.4 (L) 02/08/2019   HGB 12.0 02/08/2019   HCT 35.6 02/08/2019   PLT 117 (L) 02/08/2019   GLUCOSE 95 02/08/2019   ALT 10 02/08/2019   AST 19 02/08/2019   NA 139 02/08/2019   K 4.8 02/08/2019   CL 107 02/08/2019   CREATININE 1.15 (H) 02/08/2019   BUN 26 (H) 02/08/2019   CO2 25 02/08/2019   TSH 3.95 04/08/2018   INR 1.48 06/29/2016   HGBA1C 5.5 06/24/2016    Dg Shoulder Right  Result Date: 03/13/2019 CLINICAL DATA:  Right shoulder pain following a fall. EXAM: RIGHT SHOULDER - 2+ VIEW COMPARISON:  05/30/2005 FINDINGS: Mild greater tuberosity hyperostosis. Moderate superior acromioclavicular spur formation and superior subluxation of the distal clavicle relative to the acromion, not previously present. Normal coracoclavicular distance. Right axillary surgical clips. IMPRESSION: 1. Interval superior subluxation of the distal clavicle relative to the acromion. Otherwise, no fracture or dislocation seen. 2. Moderate superior spur formation arising from the acromion. 3. Greater tuberosity hyperostosis. This can be seen with chronic rotator cuff tendinosis. Electronically Signed   By: Claudie Revering M.D.   On: 03/13/2019 12:55   Ct Head Wo Contrast  Result Date: 03/13/2019 CLINICAL DATA:  Head trauma, fall on marble floor with right frontal hematoma. EXAM: CT HEAD WITHOUT CONTRAST CT CERVICAL SPINE WITHOUT CONTRAST TECHNIQUE: Multidetector CT imaging of the head and cervical spine was performed following the standard protocol without intravenous contrast. Multiplanar CT image reconstructions of the cervical spine were also generated. COMPARISON:  10/17/2018 head and cervical spine CT. FINDINGS: CT HEAD FINDINGS Brain: No evidence of parenchymal hemorrhage or  extra-axial fluid collection. No mass lesion, mass effect, or midline shift. No CT evidence of acute infarction. Nonspecific minimal subcortical and periventricular white matter hypodensity, most in keeping with chronic small vessel ischemic change. Cerebral volume is age appropriate. No ventriculomegaly. Vascular: No acute abnormality. Skull: No evidence of calvarial fracture. Large anterior right frontal scalp hematoma. Sinuses/Orbits: The visualized paranasal sinuses are essentially clear. Other:  The mastoid air cells are unopacified. CT CERVICAL SPINE FINDINGS Alignment: Normal cervical lordosis. No facet subluxation. Dens is well positioned between the lateral masses of C1. Chronic minimal 2 mm anterolisthesis C3-4. Skull base and  vertebrae: No acute fracture. Chronic moderate anterior T3 vertebral compression fracture is unchanged. No primary bone lesion or focal pathologic process. Soft tissues and spinal canal: No prevertebral edema. No visible canal hematoma. Disc levels: Marked multilevel degenerative disc disease throughout the cervical spine, most prominent C3-4 and C4-5, unchanged. Moderate to severe multilevel facet arthropathy bilaterally. Mild degenerative foraminal stenosis on the right at C2-3 and C3-4 and bilaterally at C4-5 and C5-6. Upper chest: No acute abnormality. Other: Visualized mastoid air cells appear clear. No discrete thyroid nodules. No pathologically enlarged cervical nodes. IMPRESSION: 1. Large right frontal scalp hematoma. No evidence of acute intracranial abnormality. No evidence of calvarial fracture. 2. Minimal chronic small vessel ischemic changes in the cerebral white matter. 3. No cervical spine fracture or facet subluxation. 4. Advanced multilevel degenerative changes in the cervical spine as detailed. 5. Chronic moderate T3 vertebral compression fracture. Electronically Signed   By: Ilona Sorrel M.D.   On: 03/13/2019 12:51   Ct Cervical Spine Wo Contrast  Result Date:  03/13/2019 CLINICAL DATA:  Head trauma, fall on marble floor with right frontal hematoma. EXAM: CT HEAD WITHOUT CONTRAST CT CERVICAL SPINE WITHOUT CONTRAST TECHNIQUE: Multidetector CT imaging of the head and cervical spine was performed following the standard protocol without intravenous contrast. Multiplanar CT image reconstructions of the cervical spine were also generated. COMPARISON:  10/17/2018 head and cervical spine CT. FINDINGS: CT HEAD FINDINGS Brain: No evidence of parenchymal hemorrhage or extra-axial fluid collection. No mass lesion, mass effect, or midline shift. No CT evidence of acute infarction. Nonspecific minimal subcortical and periventricular white matter hypodensity, most in keeping with chronic small vessel ischemic change. Cerebral volume is age appropriate. No ventriculomegaly. Vascular: No acute abnormality. Skull: No evidence of calvarial fracture. Large anterior right frontal scalp hematoma. Sinuses/Orbits: The visualized paranasal sinuses are essentially clear. Other:  The mastoid air cells are unopacified. CT CERVICAL SPINE FINDINGS Alignment: Normal cervical lordosis. No facet subluxation. Dens is well positioned between the lateral masses of C1. Chronic minimal 2 mm anterolisthesis C3-4. Skull base and vertebrae: No acute fracture. Chronic moderate anterior T3 vertebral compression fracture is unchanged. No primary bone lesion or focal pathologic process. Soft tissues and spinal canal: No prevertebral edema. No visible canal hematoma. Disc levels: Marked multilevel degenerative disc disease throughout the cervical spine, most prominent C3-4 and C4-5, unchanged. Moderate to severe multilevel facet arthropathy bilaterally. Mild degenerative foraminal stenosis on the right at C2-3 and C3-4 and bilaterally at C4-5 and C5-6. Upper chest: No acute abnormality. Other: Visualized mastoid air cells appear clear. No discrete thyroid nodules. No pathologically enlarged cervical nodes. IMPRESSION:  1. Large right frontal scalp hematoma. No evidence of acute intracranial abnormality. No evidence of calvarial fracture. 2. Minimal chronic small vessel ischemic changes in the cerebral white matter. 3. No cervical spine fracture or facet subluxation. 4. Advanced multilevel degenerative changes in the cervical spine as detailed. 5. Chronic moderate T3 vertebral compression fracture. Electronically Signed   By: Ilona Sorrel M.D.   On: 03/13/2019 12:51   Dg Shoulder Left  Result Date: 03/13/2019 CLINICAL DATA:  Left shoulder pain following a fall. EXAM: LEFT SHOULDER - 2+ VIEW COMPARISON:  12/07/2011 FINDINGS: Stable left shoulder prosthesis. No fracture or dislocation. Mediastinal surgical clips and prosthetic aortic valve. IMPRESSION: Stable left shoulder prosthesis without fracture or dislocation. Electronically Signed   By: Claudie Revering M.D.   On: 03/13/2019 12:57    Assessment & Plan:   There are no diagnoses linked to this encounter.  No orders of the defined types were placed in this encounter.    Follow-up: No follow-ups on file.  Walker Kehr, MD

## 2019-05-08 NOTE — Progress Notes (Signed)
Virtual Visit via Video Note  I connected with Diane Abbott on 05/11/19 at 11:15 AM EST by a video enabled telemedicine application and verified that I am speaking with the correct person using two identifiers.  Location: Patient: Home Provider: Clinic This service was conducted via virtual visit.  Both audio and visual tools were used.  The patient was located at home. I was located in my office.  Consent was obtained prior to the virtual visit and is aware of possible charges through their insurance for this visit.  The patient is an established patient.  Dr. Estanislado Pandy, MD conducted the virtual visit and Hazel Sams, PA-C acted as scribe during the service.  Office staff helped with scheduling follow up visits after the service was conducted.     I discussed the limitations of evaluation and management by telemedicine and the availability of in person appointments. The patient expressed understanding and agreed to proceed.  CC: Pain in multiple joints  History of Present Illness:  Diane Abbott is a 85 y.o. female with history of seronegative rheumatoid arthritis and fibromyalgia.  Patient is prescribed Enbrel 50 mg subcutaneous injections every 14 days, but she has only been injecting Enbrel once a month due to being unaware of if the prescription was approved for 2021. She has chronic pain in both hands.  She has intermittent swelling in both hands and both wrist joints.  She is having discomfort in bilateral knee replacements.  She states she was evaluated at [facility] after a fall on 03/13/19.  She did not fracture anything but continues to have some residual bruising on her face.  Review of Systems  Constitutional: Positive for malaise/fatigue. Negative for fever.  HENT: Negative for congestion.   Eyes: Negative for photophobia, pain, discharge and redness.  Respiratory: Negative for cough and wheezing.   Cardiovascular: Negative for chest pain and palpitations.  Gastrointestinal:  Positive for constipation and diarrhea. Negative for blood in stool.  Genitourinary: Negative for dysuria.  Musculoskeletal: Positive for joint pain. Negative for back pain, myalgias and neck pain.  Skin: Negative for rash.  Neurological: Negative for dizziness and headaches.  Psychiatric/Behavioral: Negative for depression and memory loss. The patient is not nervous/anxious and does not have insomnia.      Observations/Objective: Physical Exam  Constitutional: She is oriented to person, place, and time and well-developed, well-nourished, and in no distress.  HENT:  Head: Normocephalic and atraumatic.  Eyes: Conjunctivae are normal.  Pulmonary/Chest: Effort normal.  Neurological: She is alert and oriented to person, place, and time.  Psychiatric: Mood, memory, affect and judgment normal.   Patient reports morning stiffness for 5 minutes.   Patient denies nocturnal pain.  Difficulty dressing/grooming: Denies Difficulty climbing stairs: Reports Difficulty getting out of chair: Reports Difficulty using hands for taps, buttons, cutlery, and/or writing: Reports   Assessment and Plan: Visit Diagnoses: Rheumatoid arthritis of multiple sites with negative rheumatoid factor ([place]) - Severe erosive disease: She has severe erosive rheumatoid arthritis: She has chronic pain and intermittent inflammation in both hands and both wrist joints. She is prescribed Enbrel 50 mg sq injections every 14 days.  She reports she was unsure if she was approved for Enbrel for 2021, so she did not have the prescription renewed.  She has only been injecting Enbrel 50 mg once monthly.  We will send in a refill of Enbrel, and she will resume injecting Enbrel 50 mg sq every 14 days.  We discussed the importance of having lab work  every 3 months to monitor her WBC count closely.  She was also advised to hold Enbrel if she develops any signs or symptoms of an infection.  She was advised to notify us if she continues to have  recurrent flares.  She will follow up in 3 months.   Rheumatoid nodulosis (HCC)  High risk medication use - Enbrel 50 mg sq injections every 14 days. CBC and CMP were drawn on 02/08/19.  She is due to update lab work. Previous tx: Carita Pian was held in January 2017 due to discitis, inadequate response-Cimzia, Humira, intolerance MTX& Remicade).  TB gold negative on 11/24/18.   Thrombocytopenia due to drugs: She has chronic thrombocytopenia.  Platelet count on 02/08/2019 117.   Fibromyalgia: She has generalized muscle aches and muscle tenderness due to fibromyalgia.    History of left shoulder replacement - Dr. Mardelle Matte  History of total knee replacement, bilateral: She has been having increased discomfort in bilateral knee replacements.   History of chronic kidney disease: creatinine was 1.15 and GFR 44 on 02/08/2019.  History of vitamin D deficiency  Other osteoporosis without current pathological fracture - She discontinued Forteo due to it causing dizziness and frequent falls.   She had a fall on 03/13/19 but did not have any fractures at that time. We will apply for prolia 60 mg sq injections every 6 months.  History of vertebral fracture  Other medical conditions are listed as follows:   Moderate episode of recurrent major depressive disorder (HCC)  History of COPD  History of DVT (deep vein thrombosis)  S/P AVR (aortic valve replacement)  History of gastroesophageal reflux (GERD)  History of myeloproliferative disorder  H/O aortic arch replacement  History of IBS  Follow Up Instructions: She will follow up in 3 months   I discussed the assessment and treatment plan with the patient. The patient was provided an opportunity to ask questions and all were answered. The patient agreed with the plan and demonstrated an understanding of the instructions.   The patient was advised to call back or seek an in-person evaluation if the symptoms worsen or if the  condition fails to improve as anticipated.  I provided 25 minutes of non-face-to-face time during this encounter.  Bo Merino, MD   Scribed by-  Hazel Sams, PA-C

## 2019-05-11 ENCOUNTER — Encounter: Payer: Self-pay | Admitting: Rheumatology

## 2019-05-11 ENCOUNTER — Other Ambulatory Visit: Payer: Self-pay

## 2019-05-11 ENCOUNTER — Telehealth (INDEPENDENT_AMBULATORY_CARE_PROVIDER_SITE_OTHER): Payer: PPO | Admitting: Rheumatology

## 2019-05-11 ENCOUNTER — Telehealth: Payer: Self-pay | Admitting: *Deleted

## 2019-05-11 DIAGNOSIS — Z96612 Presence of left artificial shoulder joint: Secondary | ICD-10-CM

## 2019-05-11 DIAGNOSIS — M0630 Rheumatoid nodule, unspecified site: Secondary | ICD-10-CM | POA: Diagnosis not present

## 2019-05-11 DIAGNOSIS — Z952 Presence of prosthetic heart valve: Secondary | ICD-10-CM | POA: Diagnosis not present

## 2019-05-11 DIAGNOSIS — M797 Fibromyalgia: Secondary | ICD-10-CM

## 2019-05-11 DIAGNOSIS — Z96653 Presence of artificial knee joint, bilateral: Secondary | ICD-10-CM

## 2019-05-11 DIAGNOSIS — Z87448 Personal history of other diseases of urinary system: Secondary | ICD-10-CM

## 2019-05-11 DIAGNOSIS — Z8639 Personal history of other endocrine, nutritional and metabolic disease: Secondary | ICD-10-CM | POA: Diagnosis not present

## 2019-05-11 DIAGNOSIS — Z79899 Other long term (current) drug therapy: Secondary | ICD-10-CM

## 2019-05-11 DIAGNOSIS — M0609 Rheumatoid arthritis without rheumatoid factor, multiple sites: Secondary | ICD-10-CM | POA: Diagnosis not present

## 2019-05-11 DIAGNOSIS — Z8781 Personal history of (healed) traumatic fracture: Secondary | ICD-10-CM

## 2019-05-11 DIAGNOSIS — M818 Other osteoporosis without current pathological fracture: Secondary | ICD-10-CM

## 2019-05-11 DIAGNOSIS — Z86718 Personal history of other venous thrombosis and embolism: Secondary | ICD-10-CM

## 2019-05-11 DIAGNOSIS — Z8719 Personal history of other diseases of the digestive system: Secondary | ICD-10-CM

## 2019-05-11 DIAGNOSIS — Z8709 Personal history of other diseases of the respiratory system: Secondary | ICD-10-CM

## 2019-05-11 DIAGNOSIS — T50905A Adverse effect of unspecified drugs, medicaments and biological substances, initial encounter: Secondary | ICD-10-CM

## 2019-05-11 DIAGNOSIS — D6959 Other secondary thrombocytopenia: Secondary | ICD-10-CM

## 2019-05-11 DIAGNOSIS — Z95828 Presence of other vascular implants and grafts: Secondary | ICD-10-CM

## 2019-05-11 DIAGNOSIS — Z862 Personal history of diseases of the blood and blood-forming organs and certain disorders involving the immune mechanism: Secondary | ICD-10-CM

## 2019-05-11 DIAGNOSIS — F331 Major depressive disorder, recurrent, moderate: Secondary | ICD-10-CM

## 2019-05-11 NOTE — Telephone Encounter (Signed)
Apply for Prolia. Thank you.

## 2019-05-12 NOTE — Telephone Encounter (Signed)
Attempted to contact patient and could not leave voicemail because mailbox is full.

## 2019-05-12 NOTE — Telephone Encounter (Signed)
Ran test claim for Prolia, no authorization required. Patient's copay is $160.00. No restrictions patient can fill through [facility].  Called patient to discuss. Patient qualifies for [place] [employer] Grant. [employer] and patient has been approved for $1,000 grant. Authorization dates are as follows: 04/12/19 through 04/10/20.  Billing info:  Pharmacy Card [id number]  [phone] [id number]  [id number]  9:48 AM Beatriz Chancellor, CPhT

## 2019-05-16 ENCOUNTER — Telehealth: Payer: Self-pay | Admitting: Internal Medicine

## 2019-05-16 NOTE — Telephone Encounter (Signed)
Patient would like to come to the office and get lab done from another provider and wants to know if Dr. Alain Marion can order them for her. Please call pt back, thanks.

## 2019-05-16 NOTE — Telephone Encounter (Signed)
VM full, unable to reach pt.  Pt will need to go to office that wants to have to labs done to get them drawn.

## 2019-05-17 NOTE — Telephone Encounter (Signed)
VM full, unable to reach °

## 2019-05-25 NOTE — Telephone Encounter (Signed)
Patient states she she is going to have her lab work done tomorrow. Patient advised once lab results received will send Prolia to the pharmacy once results received. Patient verbalized understanding.

## 2019-06-02 ENCOUNTER — Other Ambulatory Visit (INDEPENDENT_AMBULATORY_CARE_PROVIDER_SITE_OTHER): Payer: PPO

## 2019-06-02 DIAGNOSIS — R3915 Urgency of urination: Secondary | ICD-10-CM | POA: Diagnosis not present

## 2019-06-02 DIAGNOSIS — T50905A Adverse effect of unspecified drugs, medicaments and biological substances, initial encounter: Secondary | ICD-10-CM

## 2019-06-02 DIAGNOSIS — D6959 Other secondary thrombocytopenia: Secondary | ICD-10-CM

## 2019-06-02 LAB — CBC WITH DIFFERENTIAL/PLATELET
Basophils Absolute: 0.1 10*3/uL (ref 0.0–0.1)
Basophils Relative: 1.3 % (ref 0.0–3.0)
Eosinophils Absolute: 0.1 10*3/uL (ref 0.0–0.7)
Eosinophils Relative: 3.8 % (ref 0.0–5.0)
HCT: 37.3 % (ref 36.0–46.0)
Hemoglobin: 12.4 g/dL (ref 12.0–15.0)
Lymphocytes Relative: 28.7 % (ref 12.0–46.0)
Lymphs Abs: 1.1 10*3/uL (ref 0.7–4.0)
MCHC: 33.1 g/dL (ref 30.0–36.0)
MCV: 93.8 fl (ref 78.0–100.0)
Monocytes Absolute: 0.3 10*3/uL (ref 0.1–1.0)
Monocytes Relative: 8.3 % (ref 3.0–12.0)
Neutro Abs: 2.3 10*3/uL (ref 1.4–7.7)
Neutrophils Relative %: 57.9 % (ref 43.0–77.0)
Platelets: 142 10*3/uL — ABNORMAL LOW (ref 150.0–400.0)
RBC: 3.98 Mil/uL (ref 3.87–5.11)
RDW: 13.9 % (ref 11.5–15.5)
WBC: 3.9 10*3/uL — ABNORMAL LOW (ref 4.0–10.5)

## 2019-06-02 LAB — URINALYSIS, ROUTINE W REFLEX MICROSCOPIC
Bilirubin Urine: NEGATIVE
Ketones, ur: NEGATIVE
Nitrite: NEGATIVE
Specific Gravity, Urine: 1.025 (ref 1.000–1.030)
Total Protein, Urine: NEGATIVE
Urine Glucose: NEGATIVE
Urobilinogen, UA: 0.2 (ref 0.0–1.0)
pH: 5.5 (ref 5.0–8.0)

## 2019-06-02 LAB — HEPATIC FUNCTION PANEL
ALT: 8 U/L (ref 0–35)
AST: 16 U/L (ref 0–37)
Albumin: 4.1 g/dL (ref 3.5–5.2)
Alkaline Phosphatase: 69 U/L (ref 39–117)
Bilirubin, Direct: 0.1 mg/dL (ref 0.0–0.3)
Total Bilirubin: 0.5 mg/dL (ref 0.2–1.2)
Total Protein: 7.3 g/dL (ref 6.0–8.3)

## 2019-06-02 LAB — BASIC METABOLIC PANEL
BUN: 23 mg/dL (ref 6–23)
CO2: 27 mEq/L (ref 19–32)
Calcium: 9.3 mg/dL (ref 8.4–10.5)
Chloride: 105 mEq/L (ref 96–112)
Creatinine, Ser: 1.09 mg/dL (ref 0.40–1.20)
GFR: 47.93 mL/min — ABNORMAL LOW (ref >60.00)
Glucose, Bld: 98 mg/dL (ref 70–99)
Potassium: 4 mEq/L (ref 3.5–5.1)
Sodium: 139 mEq/L (ref 135–145)

## 2019-06-05 ENCOUNTER — Telehealth: Payer: Self-pay | Admitting: Internal Medicine

## 2019-06-05 NOTE — Telephone Encounter (Signed)
Please advise 

## 2019-06-05 NOTE — Telephone Encounter (Signed)
Called [employer] to check status of patient's application. Rep Jarrett Soho advised that application has been DENIED, due to patient may be eligible for Medicare Extra Help/ Low Income Subsidy. Patient can call [phone] to apply. If denied Medicare Extra Help, patient can submit denial letter to [employer] for redetermination.  Called patient to discuss, left message.  Phone# [phone] Fax# [phone]

## 2019-06-05 NOTE — Telephone Encounter (Signed)
Patient called back in reference to Dr. Reola Calkins response to her labs through my chart.   Patient states she currently has burning and bladder spasms.  Patient has been taking OTC meds but is not clearing symptoms.  Please follow up in regard.

## 2019-06-07 ENCOUNTER — Other Ambulatory Visit: Payer: Self-pay | Admitting: Internal Medicine

## 2019-06-07 MED ORDER — CIPROFLOXACIN HCL 250 MG PO TABS: 250.0000 mg | ORAL_TABLET | Freq: Two times a day (BID) | ORAL | 0 refills | Status: DC

## 2019-06-07 NOTE — Telephone Encounter (Signed)
Cipro prescription is sent to [facility].  Thanks

## 2019-06-08 NOTE — Telephone Encounter (Signed)
Pt.notified

## 2019-06-13 ENCOUNTER — Telehealth: Payer: Self-pay | Admitting: Pharmacist

## 2019-06-13 NOTE — Telephone Encounter (Signed)
Patient called inquiring about status of patient assistance.  Unable to find any notes updated in epic or correspondence in the media tab.  Reviewed Excel sheet of patients enrolled in patient assistance and note states that patient application was pending Medicare extra help denial.  Advised patient she needs to apply for Medicare extra help.  Patient states she will not qualify as she was denied food stamps in the past.  Advised patient if she is denied we can send denial letter to [place] for approval.  Patient verbalized understanding.  Patient given the number to Medicare extra help to apply.  Encourage patient to contact [employer] patient assistance for more information about her case.  Patient verbalized understanding.   Mariella Saa, PharmD, [place], [facility] Pharmacist [phone]  06/13/2019 10:31 AM

## 2019-06-29 ENCOUNTER — Telehealth: Payer: Self-pay

## 2019-06-29 DIAGNOSIS — Z7409 Other reduced mobility: Secondary | ICD-10-CM

## 2019-06-29 DIAGNOSIS — M0609 Rheumatoid arthritis without rheumatoid factor, multiple sites: Secondary | ICD-10-CM

## 2019-06-29 NOTE — Telephone Encounter (Signed)
New message    The patient calling Dr. Alain Marion prescribed a walker in December.   The insurance company is asking Dr. Alain Marion to specify which type of walker  The patient is asking for an Upright walker.

## 2019-06-30 NOTE — Telephone Encounter (Signed)
LMTCB

## 2019-07-03 ENCOUNTER — Encounter: Payer: Self-pay | Admitting: Interventional Cardiology

## 2019-07-05 NOTE — Telephone Encounter (Signed)
New message:   Pt states she is unable to come by and pick up the order. She states the order needs to be faxed to [facility] at [phone]. She states the walker needs to be one with a seat. She states it's called a Rollator. She states to call if you need her to explain anything. Please advise.

## 2019-07-05 NOTE — Telephone Encounter (Signed)
faxed

## 2019-07-11 ENCOUNTER — Other Ambulatory Visit: Payer: Self-pay

## 2019-07-11 ENCOUNTER — Encounter: Payer: Self-pay | Admitting: Internal Medicine

## 2019-07-11 ENCOUNTER — Ambulatory Visit (INDEPENDENT_AMBULATORY_CARE_PROVIDER_SITE_OTHER): Payer: PPO | Admitting: Internal Medicine

## 2019-07-11 DIAGNOSIS — G894 Chronic pain syndrome: Secondary | ICD-10-CM | POA: Diagnosis not present

## 2019-07-11 DIAGNOSIS — M25562 Pain in left knee: Secondary | ICD-10-CM | POA: Diagnosis not present

## 2019-07-11 DIAGNOSIS — J431 Panlobular emphysema: Secondary | ICD-10-CM

## 2019-07-11 DIAGNOSIS — M0609 Rheumatoid arthritis without rheumatoid factor, multiple sites: Secondary | ICD-10-CM

## 2019-07-11 DIAGNOSIS — K219 Gastro-esophageal reflux disease without esophagitis: Secondary | ICD-10-CM | POA: Diagnosis not present

## 2019-07-11 DIAGNOSIS — G8929 Other chronic pain: Secondary | ICD-10-CM | POA: Diagnosis not present

## 2019-07-11 DIAGNOSIS — M069 Rheumatoid arthritis, unspecified: Secondary | ICD-10-CM | POA: Diagnosis not present

## 2019-07-11 NOTE — Assessment & Plan Note (Signed)
Stable - on Embrel

## 2019-07-11 NOTE — Assessment & Plan Note (Addendum)
Worse Re-start Breo Chest CT reviewed

## 2019-07-11 NOTE — Assessment & Plan Note (Signed)
Protonix.  ?

## 2019-07-11 NOTE — Assessment & Plan Note (Signed)
Pain, deformity Diane Abbott

## 2019-07-11 NOTE — Progress Notes (Signed)
Subjective:  Patient ID: Diane Abbott, female    DOB: [birth year]  Age: 85 y.o. MRN: [id number]  CC: No chief complaint on file.   HPI RODELLA LOPER presents for GERD, COPD, RA, chronic pain f/u C/o cough x 1 year  Outpatient Medications Prior to Visit  Medication Sig Dispense Refill   aspirin 81 MG tablet Take 1 tablet (81 mg total) by mouth daily.     etanercept (ENBREL SURECLICK) 50 MG/ML injection Inject 0.98 mLs (50 mg total) into the skin every 14 (fourteen) days. 6 pen 0   finasteride (PROSCAR) 5 MG tablet As directed 1/4 tablet daily 30 tablet 3   fluticasone furoate-vilanterol (BREO ELLIPTA) 100-25 MCG/INH AEPB INHALE 1 PUFF BY MOUTH ONCE DAILY 60 each 11   pantoprazole (PROTONIX) 40 MG tablet Take 40 mg by mouth daily as needed (GERD).     rOPINIRole (REQUIP) 4 MG tablet Take 1 tablet (4 mg total) by mouth 3 (three) times daily. 90 tablet 11   torsemide (DEMADEX) 20 MG tablet Take 40 mg by mouth 2 (two) times daily as needed (Swelling).      ciprofloxacin (CIPRO) 250 MG tablet Take 1 tablet (250 mg total) by mouth 2 (two) times daily. 10 tablet 0   promethazine (PHENERGAN) 12.5 MG tablet Take 1-2 tablets (12.5-25 mg total) by mouth every 6 (six) hours as needed for up to 7 days for nausea or vomiting. 60 tablet 0   No facility-administered medications prior to visit.    ROS: Review of Systems  Constitutional: Negative for activity change, appetite change, chills, fatigue and unexpected weight change.  HENT: Negative for congestion, mouth sores and sinus pressure.   Eyes: Negative for visual disturbance.  Respiratory: Positive for cough. Negative for chest tightness.   Gastrointestinal: Negative for abdominal pain and nausea.  Genitourinary: Negative for difficulty urinating, frequency and vaginal pain.  Musculoskeletal: Positive for arthralgias, back pain and gait problem.  Skin: Negative for pallor and rash.  Neurological: Negative for dizziness, tremors,  weakness, numbness and headaches.  Psychiatric/Behavioral: Negative for confusion, sleep disturbance and suicidal ideas.    Objective:  There were no vitals taken for this visit.  BP Readings from Last 3 Encounters:  04/11/19 134/72  04/10/19 130/72  03/13/19 133/69    Wt Readings from Last 3 Encounters:  04/11/19 166 lb (75.3 kg)  04/10/19 167 lb (75.8 kg)  03/13/19 170 lb (77.1 kg)    Physical Exam Constitutional:      General: She is not in acute distress.    Appearance: She is well-developed.  HENT:     Head: Normocephalic.     Right Ear: External ear normal.     Left Ear: External ear normal.     Nose: Nose normal.  Eyes:     General:        Right eye: No discharge.        Left eye: No discharge.     Conjunctiva/sclera: Conjunctivae normal.     Pupils: Pupils are equal, round, and reactive to light.  Neck:     Thyroid: No thyromegaly.     Vascular: No JVD.     Trachea: No tracheal deviation.  Cardiovascular:     Rate and Rhythm: Normal rate and regular rhythm.     Heart sounds: Normal heart sounds.  Pulmonary:     Effort: No respiratory distress.     Breath sounds: No stridor. No wheezing.  Abdominal:     General: Bowel sounds  are normal. There is no distension.     Palpations: Abdomen is soft. There is no mass.     Tenderness: There is no abdominal tenderness. There is no guarding or rebound.  Musculoskeletal:        General: Tenderness present.     Cervical back: Normal range of motion and neck supple.  Lymphadenopathy:     Cervical: No cervical adenopathy.  Skin:    Findings: No erythema or rash.  Neurological:     Mental Status: She is oriented to person, place, and time.     Cranial Nerves: No cranial nerve deficit.     Motor: No abnormal muscle tone.     Coordination: Coordination normal.     Deep Tendon Reflexes: Reflexes normal.  Psychiatric:        Behavior: Behavior normal.        Thought Content: Thought content normal.        Judgment:  Judgment normal.     Lab Results  Component Value Date   WBC 3.9 (L) 06/02/2019   HGB 12.4 06/02/2019   HCT 37.3 06/02/2019   PLT 142.0 (L) 06/02/2019   GLUCOSE 98 06/02/2019   ALT 8 06/02/2019   AST 16 06/02/2019   NA 139 06/02/2019   K 4.0 06/02/2019   CL 105 06/02/2019   CREATININE 1.09 06/02/2019   BUN 23 06/02/2019   CO2 27 06/02/2019   TSH 3.95 04/08/2018   INR 1.48 06/29/2016   HGBA1C 5.5 06/24/2016    DG Shoulder Right  Result Date: 03/13/2019 CLINICAL DATA:  Right shoulder pain following a fall. EXAM: RIGHT SHOULDER - 2+ VIEW COMPARISON:  05/30/2005 FINDINGS: Mild greater tuberosity hyperostosis. Moderate superior acromioclavicular spur formation and superior subluxation of the distal clavicle relative to the acromion, not previously present. Normal coracoclavicular distance. Right axillary surgical clips. IMPRESSION: 1. Interval superior subluxation of the distal clavicle relative to the acromion. Otherwise, no fracture or dislocation seen. 2. Moderate superior spur formation arising from the acromion. 3. Greater tuberosity hyperostosis. This can be seen with chronic rotator cuff tendinosis. Electronically Signed   By: Claudie Revering M.D.   On: 03/13/2019 12:55   CT Head Wo Contrast  Result Date: 03/13/2019 CLINICAL DATA:  Head trauma, fall on marble floor with right frontal hematoma. EXAM: CT HEAD WITHOUT CONTRAST CT CERVICAL SPINE WITHOUT CONTRAST TECHNIQUE: Multidetector CT imaging of the head and cervical spine was performed following the standard protocol without intravenous contrast. Multiplanar CT image reconstructions of the cervical spine were also generated. COMPARISON:  10/17/2018 head and cervical spine CT. FINDINGS: CT HEAD FINDINGS Brain: No evidence of parenchymal hemorrhage or extra-axial fluid collection. No mass lesion, mass effect, or midline shift. No CT evidence of acute infarction. Nonspecific minimal subcortical and periventricular white matter  hypodensity, most in keeping with chronic small vessel ischemic change. Cerebral volume is age appropriate. No ventriculomegaly. Vascular: No acute abnormality. Skull: No evidence of calvarial fracture. Large anterior right frontal scalp hematoma. Sinuses/Orbits: The visualized paranasal sinuses are essentially clear. Other:  The mastoid air cells are unopacified. CT CERVICAL SPINE FINDINGS Alignment: Normal cervical lordosis. No facet subluxation. Dens is well positioned between the lateral masses of C1. Chronic minimal 2 mm anterolisthesis C3-4. Skull base and vertebrae: No acute fracture. Chronic moderate anterior T3 vertebral compression fracture is unchanged. No primary bone lesion or focal pathologic process. Soft tissues and spinal canal: No prevertebral edema. No visible canal hematoma. Disc levels: Marked multilevel degenerative disc disease throughout the cervical  spine, most prominent C3-4 and C4-5, unchanged. Moderate to severe multilevel facet arthropathy bilaterally. Mild degenerative foraminal stenosis on the right at C2-3 and C3-4 and bilaterally at C4-5 and C5-6. Upper chest: No acute abnormality. Other: Visualized mastoid air cells appear clear. No discrete thyroid nodules. No pathologically enlarged cervical nodes. IMPRESSION: 1. Large right frontal scalp hematoma. No evidence of acute intracranial abnormality. No evidence of calvarial fracture. 2. Minimal chronic small vessel ischemic changes in the cerebral white matter. 3. No cervical spine fracture or facet subluxation. 4. Advanced multilevel degenerative changes in the cervical spine as detailed. 5. Chronic moderate T3 vertebral compression fracture. Electronically Signed   By: Ilona Sorrel M.D.   On: 03/13/2019 12:51   CT Cervical Spine Wo Contrast  Result Date: 03/13/2019 CLINICAL DATA:  Head trauma, fall on marble floor with right frontal hematoma. EXAM: CT HEAD WITHOUT CONTRAST CT CERVICAL SPINE WITHOUT CONTRAST TECHNIQUE:  Multidetector CT imaging of the head and cervical spine was performed following the standard protocol without intravenous contrast. Multiplanar CT image reconstructions of the cervical spine were also generated. COMPARISON:  10/17/2018 head and cervical spine CT. FINDINGS: CT HEAD FINDINGS Brain: No evidence of parenchymal hemorrhage or extra-axial fluid collection. No mass lesion, mass effect, or midline shift. No CT evidence of acute infarction. Nonspecific minimal subcortical and periventricular white matter hypodensity, most in keeping with chronic small vessel ischemic change. Cerebral volume is age appropriate. No ventriculomegaly. Vascular: No acute abnormality. Skull: No evidence of calvarial fracture. Large anterior right frontal scalp hematoma. Sinuses/Orbits: The visualized paranasal sinuses are essentially clear. Other:  The mastoid air cells are unopacified. CT CERVICAL SPINE FINDINGS Alignment: Normal cervical lordosis. No facet subluxation. Dens is well positioned between the lateral masses of C1. Chronic minimal 2 mm anterolisthesis C3-4. Skull base and vertebrae: No acute fracture. Chronic moderate anterior T3 vertebral compression fracture is unchanged. No primary bone lesion or focal pathologic process. Soft tissues and spinal canal: No prevertebral edema. No visible canal hematoma. Disc levels: Marked multilevel degenerative disc disease throughout the cervical spine, most prominent C3-4 and C4-5, unchanged. Moderate to severe multilevel facet arthropathy bilaterally. Mild degenerative foraminal stenosis on the right at C2-3 and C3-4 and bilaterally at C4-5 and C5-6. Upper chest: No acute abnormality. Other: Visualized mastoid air cells appear clear. No discrete thyroid nodules. No pathologically enlarged cervical nodes. IMPRESSION: 1. Large right frontal scalp hematoma. No evidence of acute intracranial abnormality. No evidence of calvarial fracture. 2. Minimal chronic small vessel ischemic  changes in the cerebral white matter. 3. No cervical spine fracture or facet subluxation. 4. Advanced multilevel degenerative changes in the cervical spine as detailed. 5. Chronic moderate T3 vertebral compression fracture. Electronically Signed   By: Ilona Sorrel M.D.   On: 03/13/2019 12:51   DG Shoulder Left  Result Date: 03/13/2019 CLINICAL DATA:  Left shoulder pain following a fall. EXAM: LEFT SHOULDER - 2+ VIEW COMPARISON:  12/07/2011 FINDINGS: Stable left shoulder prosthesis. No fracture or dislocation. Mediastinal surgical clips and prosthetic aortic valve. IMPRESSION: Stable left shoulder prosthesis without fracture or dislocation. Electronically Signed   By: Claudie Revering M.D.   On: 03/13/2019 12:57    Assessment & Plan:   There are no diagnoses linked to this encounter.   No orders of the defined types were placed in this encounter.    Follow-up: No follow-ups on file.  Walker Kehr, MD

## 2019-07-11 NOTE — Assessment & Plan Note (Signed)
Stable

## 2019-08-02 ENCOUNTER — Ambulatory Visit: Payer: PPO | Admitting: Interventional Cardiology

## 2019-08-07 ENCOUNTER — Ambulatory Visit: Payer: PPO | Attending: Internal Medicine

## 2019-08-07 DIAGNOSIS — Z23 Encounter for immunization: Secondary | ICD-10-CM

## 2019-08-07 NOTE — Progress Notes (Signed)
Office Visit Note  Patient: Diane Abbott             Date of Birth: 1935/03/12           MRN: [id number]             PCP: Cassandria Anger, MD Referring: Cassandria Anger, MD Visit Date: 08/09/2019 Occupation: @GUAROCC @  Subjective:  Discuss medications   History of Present Illness: Diane Abbott is a 85 y.o. female with history of seronegative rheumatoid arthritis and fibromyalgia. She states she was not approved for Enbrel in January 2021.  She states her last dose of Enbrel was in December 2020.  She would like restart on Enbrel.  She is having increased pain and swelling in both hands and both wrist joints.  She has chronic pain in the left knee which is replaced.  She states that she fell in November and has had increased discomfort in the left shoulder joint which was previously replaced.  Activities of Daily Living:  Patient reports joint stiffness all day  Patient Reports nocturnal pain.  Difficulty dressing/grooming: Denies Difficulty climbing stairs: Reports Difficulty getting out of chair: Reports Difficulty using hands for taps, buttons, cutlery, and/or writing: Reports  Review of Systems  Constitutional: Positive for fatigue.  HENT: Negative for mouth sores, mouth dryness and nose dryness.   Eyes: Negative for pain, itching, visual disturbance and dryness.  Respiratory: Negative for cough, hemoptysis, shortness of breath and difficulty breathing.   Cardiovascular: Negative for chest pain, palpitations, hypertension and swelling in legs/feet.  Gastrointestinal: Positive for diarrhea. Negative for blood in stool and constipation.  Endocrine: Negative for increased urination.  Genitourinary: Negative for difficulty urinating and painful urination.  Musculoskeletal: Positive for arthralgias, joint pain, joint swelling and morning stiffness. Negative for myalgias, muscle weakness, muscle tenderness and myalgias.  Skin: Positive for rash and redness. Negative  for color change, pallor, hair loss, nodules/bumps, skin tightness, ulcers and sensitivity to sunlight.  Allergic/Immunologic: Negative for susceptible to infections.  Neurological: Negative for numbness, headaches and memory loss.  Hematological: Negative for swollen glands.  Psychiatric/Behavioral: Negative for depressed mood, confusion and sleep disturbance. The patient is not nervous/anxious.     PMFS History:  Patient Active Problem List   Diagnosis Date Noted  . Hair loss 04/11/2019  . Forehead laceration 04/11/2019  . Urinary urgency 04/11/2019  . Contusion of face 03/15/2019  . Hand laceration 10/20/2018  . Abdominal pain 07/15/2018  . History of left shoulder replacement 11/13/2016  . History of total knee replacement, bilateral 11/13/2016  . History of myeloproliferative disorder 09/17/2016  . History of IBS 09/17/2016  . Diskitis 08/29/2015  . Acute osteomyelitis of lumbar spine ([place]) 07/29/2015  . Thoracic compression fracture ([place]) 07/29/2015  . Discitis of lumbosacral region   . Epidural abscess   . Chronic renal insufficiency, stage III (moderate) 02/28/2015  . Hammer toe, acquired 11/29/2014  . Knee pain, left 07/25/2013  . S/P ascending aortic replacement 10/04/2012  . S/P AVR (aortic valve replacement) 10/04/2012  . Aortic aneurysm, thoracic ([place]) 09/14/2012  . Nausea 09/09/2011  . COPD (chronic obstructive pulmonary disease) ([place]) 03/12/2011  . RESTLESS LEG SYNDROME 02/03/2010  . Thrush of mouth and esophagus ([place]) 07/04/2009  . History of pulmonary embolism 09/14/2008  . BRONCHITIS, ACUTE 06/06/2008  . Chest pain 07/13/2007  . GERD 07/08/2007  . Irritable bowel syndrome 07/08/2007  . Chronic pain 07/08/2007  . OSTEOARTHRITIS 03/10/2007  . Osteopenia 03/10/2007  . PULMONARY EMBOLISM, HX  OF 03/10/2007  . Rheumatoid arthritis ([place]) 11/23/2006    Past Medical History:  Diagnosis Date  . Adrenal insufficiency ([place])   . Anemia   . Anginal pain [facility])     "comes and goes has occured since 2012"  . Blood dyscrasia    "problems with platelets, red cells and white cells being low"  . Bronchitis Jan 2013-March 2013   severe  . Cancer ([place])    skin cancer on face  . Cataracts, bilateral    more in left than right  . COPD (chronic obstructive pulmonary disease) ([place])   . Depression   . Esophagitis   . Fatty liver   . Fibromyalgia   . Fibromyalgia   . Foot pain, left 08/26/2016   2/18 1st MTP - gout vs other  . GERD (gastroesophageal reflux disease)   . Heart murmur   . History of blood clots 2008   below knee post trauma. Dilated [facility] no clotting disorder felt to be present.  . History of echocardiogram    Echo 11/16: Mild LVH, EF 60-65%, normal wall motion, grade 1 diastolic dysfunction, bioprosthetic AVR with mean gradient 21 mmHg and no regurgitation, proximal aortic arch aneurysm 49 mm, moderate TR, PASP 31 mmHg  . IBS (irritable bowel syndrome)   . Leaky heart valve   . Osteoarthritis   . Osteoarthritis of left shoulder 12/07/2011  . Osteopenia   . Pulmonary embolism ([place])   . RA (rheumatoid arthritis) ([place])   . Shortness of breath    attributed to aneurysm  . Sliding hiatal hernia   . Urinary leakage    when coughing  . Vertigo    hx of  . Vitamin B 12 deficiency   . Vitamin D deficiency     Family History  Problem Relation Age of Onset  . Ovarian cancer Mother   . Diabetes Mother   . COPD Father   . Heart disease Maternal Grandmother   . Clotting disorder Other        Father, brother, sister, and daughter all had deep venous thrombosis  . Breast cancer Other   . Pancreatic cancer Paternal Uncle   . Colon cancer Neg Hx    Past Surgical History:  Procedure Laterality Date  . ABDOMINAL HYSTERECTOMY    . AORTA SURGERY  12/2012   Resection of aneurysm and valve replacement  . APPENDECTOMY  1956  . BENTALL PROCEDURE N/A 09/29/2012   Procedure: BENTALL PROCEDURE;  Surgeon: Gaye Pollack, MD;  Location: [facility];  Service: Open Heart Surgery;  Laterality: N/A;  WITH CIRC ARREST  . BILATERAL OOPHORECTOMY    . BREAST BIOPSY Left   . CARDIAC CATHETERIZATION  09/15/12   no PCI  . CATARACT EXTRACTION Bilateral   . CHOLECYSTECTOMY  2003  . COLONOSCOPY    . COSMETIC SURGERY     Tummy tuck  . INTRAOPERATIVE TRANSESOPHAGEAL ECHOCARDIOGRAM N/A 09/29/2012   Procedure: INTRAOPERATIVE TRANSESOPHAGEAL ECHOCARDIOGRAM;  Surgeon: Gaye Pollack, MD;  Location: [facility] OR;  Service: Open Heart Surgery;  Laterality: N/A;  . JOINT REPLACEMENT Bilateral   . REPLACEMENT ASCENDING AORTA N/A 06/29/2016   Procedure: AORTIC ARCH REPLACEMENT WITH CIRC ARREST (N/A) - RIGHT AXILLARY CANNULATION  AORTO-AORTIC 28 HEMASHIELD GRAFT WITH AORTO-INNOMINATE AND AORTO-LEFT CAROTID ANASTAMOSIS;  Surgeon: Gaye Pollack, MD;  Location: [facility] OR;  Service: Open Heart Surgery;  Laterality: N/A;  RIGHT AXILLARY CANNULATION  BILATERAL RADIAL A-LINE  . SKIN CANCER EXCISION Right    cheek  .  TEE WITHOUT CARDIOVERSION N/A 06/29/2016   Procedure: TRANSESOPHAGEAL ECHOCARDIOGRAM (TEE);  Surgeon: Gaye Pollack, MD;  Location: [facility];  Service: Open Heart Surgery;  Laterality: N/A;  . TONSILLECTOMY  1941  . TOTAL KNEE ARTHROPLASTY     bilateral  . TOTAL SHOULDER ARTHROPLASTY  12/07/2011   Procedure: TOTAL SHOULDER ARTHROPLASTY;  Surgeon: Johnny Bridge, MD;  Location: [facility];  Service: Orthopedics;  Laterality: Left;  left total shoulder artthroplasty  . WRIST SURGERY     bilateral   Social History   Social History Narrative  . Not on file   Immunization History  Administered Date(s) Administered  . Influenza Whole 03/10/2007, 02/01/2008  . PFIZER SARS-COV-2 Vaccination 08/07/2019  . Pneumococcal Conjugate-13 03/23/2013  . Pneumococcal Polysaccharide-23 02/03/2010  . Td 03/21/2012  . Tdap 10/17/2018  . Zoster 11/04/2010     Objective: Vital Signs: BP 139/73 (BP Location: Left Arm, Patient Position: Sitting, Cuff Size: Normal)   Pulse 76    Resp 15   Ht 5' (1.524 m)   Wt 171 lb 3.2 oz (77.7 kg)   BMI 33.44 kg/m    Physical Exam Vitals and nursing note reviewed.  Constitutional:      Appearance: She is well-developed.  HENT:     Head: Normocephalic and atraumatic.  Eyes:     Conjunctiva/sclera: Conjunctivae normal.  Pulmonary:     Effort: Pulmonary effort is normal.  Abdominal:     General: Bowel sounds are normal.     Palpations: Abdomen is soft.  Musculoskeletal:     Cervical back: Normal range of motion.  Lymphadenopathy:     Cervical: No cervical adenopathy.  Skin:    General: Skin is warm and dry.     Capillary Refill: Capillary refill takes less than 2 seconds.  Neurological:     Mental Status: She is alert and oriented to person, place, and time.  Psychiatric:        Behavior: Behavior normal.      Musculoskeletal Exam: C-spine limited range of motion.  Thoracic kyphosis noted.  Difficult to assess lumbar spine range of motion.  Right shoulder has full active range of motion.  Left shoulder replacement has full passive range of motion and slightly limited active abduction.  Elbow joints have good range of motion and with no tenderness or inflammation.  Very limited range of motion of both wrist joints.  She has extensor tenosynovitis of the left wrist.  She has tenderness and synovitis of bilateral second MCPs and left third MCP.  Hip joints difficult to assess in seated position.  Left knee replacement is warm and has slightly limited extension.  Right knee has full range of motion with no warmth or effusion.  Ankle joints have good range of motion with no tenderness or inflammation.  CDAI Exam: CDAI Score: 9.2  Patient Global: 6 mm; Provider Global: 6 mm Swollen: 4 ; Tender: 4  Joint Exam 08/09/2019      Right  Left  Wrist     Swollen Tender  MCP 2  Swollen Tender  Swollen Tender  MCP 3     Swollen Tender     Investigation: No additional findings.  Imaging: No results found.  Recent  Labs: Lab Results  Component Value Date   WBC 3.9 (L) 06/02/2019   HGB 12.4 06/02/2019   PLT 142.0 (L) 06/02/2019   NA 139 06/02/2019   K 4.0 06/02/2019   CL 105 06/02/2019   CO2 27 06/02/2019   GLUCOSE  98 06/02/2019   BUN 23 06/02/2019   CREATININE 1.09 06/02/2019   BILITOT 0.5 06/02/2019   ALKPHOS 69 06/02/2019   AST 16 06/02/2019   ALT 8 06/02/2019   PROT 7.3 06/02/2019   ALBUMIN 4.1 06/02/2019   CALCIUM 9.3 06/02/2019   GFRAA 51 (L) 02/08/2019   QFTBGOLD TNP 03/12/2015   QFTBGOLDPLUS NEGATIVE 11/24/2018    Speciality Comments: Piror therapy included: Simponi, Arava, Cimzia and Humira (inadequate response), methotrexate and Remicade (intolerance)  Procedures:  No procedures performed Allergies: Acetaminophen, Codeine, Pramipexole dihydrochloride, Rofecoxib, Venlafaxine, Arava [leflunomide], Clonazepam, Diclofenac sodium, Alprazolam, Darvocet [propoxyphene n-acetaminophen], Duloxetine, Gabapentin, Latex, Morphine and related, Nortriptyline hcl, Penicillins, and Prednisone   Assessment / Plan:     Visit Diagnoses: Rheumatoid arthritis of multiple sites with negative rheumatoid factor ([place]) - Severe erosive disease. Previous tx: Carita Pian was held in January 2017 due to discitis, inadequate response-Cimzia, Humira, intolerance MTX& Remicade): She has synovitis of several joints as described above.  She has been off of Enbrel since December 2020 due to her insurance not approving Enbrel for 2021.  According to the patient she was recently approved for Enbrel and would like to restart on Enbrel weekly injections.  We will update lab work today.  If labs are stable she will resume Enbrel 50 mg sq injections every 14 days.  If she notices clinical improvement she was advised to try spacing the dose of Enbrel to every 21 days due to her history of leukopenia and thrombocytopenia.  We discussed at length increased susceptibility to infections while on Enbrel.  She was advised to hold  enbrel if she develops any signs or symptoms of infections.  She will follow up in 3 months.   Rheumatoid nodulosis (HCC)  High risk medication use -She will be restarting on Enbrel 50 mg sq injections every 14 days.  CBC, CMP, and TB gold were ordered today.  She will return for lab work in 1 month and every 3 months.  Standing orders were placed today.- Plan: COMPLETE METABOLIC PANEL WITH GFR, CBC with Differential/Platelet, QuantiFERON-TB Gold Plus  Thrombocytopenia due to drugs: Platelet count was 142 on 06/02/2019.  We will recheck CBC today.  She was advised to return for lab work 1 month after restarting on Enbrel to monitor for drug toxicity.   Fibromyalgia: She has generalized hyperalgesia and positive tender points on exam.  She has generalized myalgias and muscle tenderness due to fibromyalgia.  She has chronic fatigue secondary to insomnia.  She experiences nocturnal pain which causes interrupted sleep at night.  History of left shoulder replacement - Dr. Mardelle Matte: She has full passive range of motion of the left shoulder replacement.  Limited active abduction of the left shoulder replacement.  According to the patient she fell in November 2020 and has had increased discomfort in the left shoulder replacement since then.  She states that she followed up with Dr. Mardelle Matte and states that the x-rays were unremarkable after the fall.  History of total knee replacement, bilateral: Right knee replacement has full range of motion with no discomfort.  Left knee replacement has slightly limited extension and warmth on exam.  She has difficulty ambulating due to the discomfort in her knees.  She uses a Rollator walker to assist with ambulation.  Other osteoporosis without current pathological fracture - She discontinued Forteo due to it causing dizziness and frequent falls.  She is not currently taking anything for osteoporosis.   Other medical conditions are listed as follows:  History of chronic  kidney disease  History of vitamin D deficiency  History of vertebral fracture  Moderate episode of recurrent major depressive disorder (HCC)  H/O aortic arch replacement  S/P AVR (aortic valve replacement)  History of myeloproliferative disorder  History of DVT (deep vein thrombosis)  History of COPD  History of IBS  History of gastroesophageal reflux (GERD)  Orders: Orders Placed This Encounter  Procedures  . COMPLETE METABOLIC PANEL WITH GFR  . CBC with Differential/Platelet  . QuantiFERON-TB Gold Plus   No orders of the defined types were placed in this encounter.     Follow-Up Instructions: Return in about 3 months (around 11/08/2019) for Rheumatoid arthritis, Fibromyalgia.   Ofilia Neas, PA-C   I examined and evaluated the patient with Hazel Sams PA.  Patient has ongoing pain and swelling in multiple joints.  Detailed counseling was provided.  Side effects of Enbrel were reviewed.  We will refill Enbrel once the labs are available.  Her dosing will be Enbrel every other week.  If she has good response then we can space it out further to every 3 weeks.  The plan of care was discussed as noted above.  Bo Merino, MD Note - This record has been created using Editor, commissioning.  Chart creation errors have been sought, but may not always  have been located. Such creation errors do not reflect on  the standard of medical care.

## 2019-08-07 NOTE — Progress Notes (Signed)
   Covid-19 Vaccination Clinic  Name:  Diane Abbott    MRN: [id number] DOB: [birth year]  08/07/2019  [name] was observed post Covid-19 immunization for 15 minutes without incident. She was provided with Vaccine Information Sheet and instruction to access the V-Safe system.   [name] was instructed to call 911 with any severe reactions post vaccine: Marland Kitchen Difficulty breathing  . Swelling of face and throat  . A fast heartbeat  . A bad rash all over body  . Dizziness and weakness   Immunizations Administered    Name Date Dose VIS Date Route   Pfizer COVID-19 Vaccine 08/07/2019 12:46 PM 0.3 mL 04/14/2019 Intramuscular   Manufacturer: [place]   Lot: [id number]   [place] Key: [id number]

## 2019-08-09 ENCOUNTER — Encounter: Payer: Self-pay | Admitting: Rheumatology

## 2019-08-09 ENCOUNTER — Ambulatory Visit: Payer: PPO | Admitting: Rheumatology

## 2019-08-09 ENCOUNTER — Other Ambulatory Visit: Payer: Self-pay

## 2019-08-09 ENCOUNTER — Telehealth: Payer: Self-pay

## 2019-08-09 VITALS — BP 139/73 | HR 76 | Resp 15 | Ht 60.0 in | Wt 171.2 lb

## 2019-08-09 DIAGNOSIS — Z952 Presence of prosthetic heart valve: Secondary | ICD-10-CM

## 2019-08-09 DIAGNOSIS — Z8709 Personal history of other diseases of the respiratory system: Secondary | ICD-10-CM

## 2019-08-09 DIAGNOSIS — Z8639 Personal history of other endocrine, nutritional and metabolic disease: Secondary | ICD-10-CM

## 2019-08-09 DIAGNOSIS — M797 Fibromyalgia: Secondary | ICD-10-CM

## 2019-08-09 DIAGNOSIS — Z95828 Presence of other vascular implants and grafts: Secondary | ICD-10-CM

## 2019-08-09 DIAGNOSIS — Z96653 Presence of artificial knee joint, bilateral: Secondary | ICD-10-CM

## 2019-08-09 DIAGNOSIS — M0609 Rheumatoid arthritis without rheumatoid factor, multiple sites: Secondary | ICD-10-CM

## 2019-08-09 DIAGNOSIS — F331 Major depressive disorder, recurrent, moderate: Secondary | ICD-10-CM | POA: Diagnosis not present

## 2019-08-09 DIAGNOSIS — Z86718 Personal history of other venous thrombosis and embolism: Secondary | ICD-10-CM

## 2019-08-09 DIAGNOSIS — Z79899 Other long term (current) drug therapy: Secondary | ICD-10-CM | POA: Diagnosis not present

## 2019-08-09 DIAGNOSIS — D6959 Other secondary thrombocytopenia: Secondary | ICD-10-CM

## 2019-08-09 DIAGNOSIS — Z87448 Personal history of other diseases of urinary system: Secondary | ICD-10-CM | POA: Diagnosis not present

## 2019-08-09 DIAGNOSIS — Z96612 Presence of left artificial shoulder joint: Secondary | ICD-10-CM | POA: Diagnosis not present

## 2019-08-09 DIAGNOSIS — T50905A Adverse effect of unspecified drugs, medicaments and biological substances, initial encounter: Secondary | ICD-10-CM

## 2019-08-09 DIAGNOSIS — M0630 Rheumatoid nodule, unspecified site: Secondary | ICD-10-CM

## 2019-08-09 DIAGNOSIS — M818 Other osteoporosis without current pathological fracture: Secondary | ICD-10-CM | POA: Diagnosis not present

## 2019-08-09 DIAGNOSIS — Z8781 Personal history of (healed) traumatic fracture: Secondary | ICD-10-CM | POA: Diagnosis not present

## 2019-08-09 DIAGNOSIS — Z862 Personal history of diseases of the blood and blood-forming organs and certain disorders involving the immune mechanism: Secondary | ICD-10-CM

## 2019-08-09 DIAGNOSIS — Z8719 Personal history of other diseases of the digestive system: Secondary | ICD-10-CM

## 2019-08-09 NOTE — Patient Instructions (Signed)
Standing Labs We placed an order today for your standing lab work.    Please come back and get your standing labs 1 month after restarting on Enbrel   We have open lab daily Monday through Thursday from 8:30-12:30 PM and 1:30-4:30 PM and Friday from 8:30-12:30 PM and 1:30-4:00 PM at the office of Dr. Bo Merino.   You may experience shorter wait times on Monday and Friday afternoons. The office is located at [address], [place], [place], [place] [address] No appointment is necessary.   Labs are drawn by [facility].  You may receive a bill from [facility] for your lab work.  If you wish to have your labs drawn at another location, please call the office 24 hours in advance to send orders.  If you have any questions regarding directions or hours of operation,  please call [phone].   Just as a reminder please drink plenty of water prior to coming for your lab work. Thanks!

## 2019-08-09 NOTE — Telephone Encounter (Signed)
After labs result, please refill enbrel per Hazel Sams, PA-C. Thanks!

## 2019-08-10 LAB — COMPLETE METABOLIC PANEL WITH GFR
AG Ratio: 1.3 (calc) (ref 1.0–2.5)
ALT: 10 U/L (ref 6–29)
AST: 18 U/L (ref 10–35)
Albumin: 4.1 g/dL (ref 3.6–5.1)
Alkaline phosphatase (APISO): 77 U/L (ref 37–153)
BUN/Creatinine Ratio: 19 (calc) (ref 6–22)
BUN: 24 mg/dL (ref 7–25)
CO2: 26 mmol/L (ref 20–32)
Calcium: 9.6 mg/dL (ref 8.6–10.4)
Chloride: 105 mmol/L (ref 98–110)
Creat: 1.29 mg/dL — ABNORMAL HIGH (ref 0.60–0.88)
GFR, Est African American: 44 mL/min/{1.73_m2} — ABNORMAL LOW (ref >=60)
GFR, Est Non African American: 38 mL/min/{1.73_m2} — ABNORMAL LOW (ref >=60)
Globulin: 3.1 g/dL (calc) (ref 1.9–3.7)
Glucose, Bld: 98 mg/dL (ref 65–99)
Potassium: 5 mmol/L (ref 3.5–5.3)
Sodium: 140 mmol/L (ref 135–146)
Total Bilirubin: 0.5 mg/dL (ref 0.2–1.2)
Total Protein: 7.2 g/dL (ref 6.1–8.1)

## 2019-08-10 LAB — CBC WITH DIFFERENTIAL/PLATELET
Absolute Monocytes: 340 cells/uL (ref 200–950)
Basophils Absolute: 40 cells/uL (ref 0–200)
Basophils Relative: 1.2 %
Eosinophils Absolute: 139 cells/uL (ref 15–500)
Eosinophils Relative: 4.2 %
HCT: 37.4 % (ref 35.0–45.0)
Hemoglobin: 12.5 g/dL (ref 11.7–15.5)
Lymphs Abs: 1238 cells/uL (ref 850–3900)
MCH: 30.7 pg (ref 27.0–33.0)
MCHC: 33.4 g/dL (ref 32.0–36.0)
MCV: 91.9 fL (ref 80.0–100.0)
MPV: 10.7 fL (ref 7.5–12.5)
Monocytes Relative: 10.3 %
Neutro Abs: 1544 cells/uL (ref 1500–7800)
Neutrophils Relative %: 46.8 %
Platelets: 135 10*3/uL — ABNORMAL LOW (ref 140–400)
RBC: 4.07 10*6/uL (ref 3.80–5.10)
RDW: 13.6 % (ref 11.0–15.0)
Total Lymphocyte: 37.5 %
WBC: 3.3 10*3/uL — ABNORMAL LOW (ref 3.8–10.8)

## 2019-08-10 MED ORDER — ENBREL SURECLICK 50 MG/ML ~~LOC~~ SOAJ: 50.0000 mg | SUBCUTANEOUS | 0 refills | Status: DC

## 2019-08-10 NOTE — Telephone Encounter (Signed)
patient to restart on Enbrel 50 mg sq injections once a month. She will require spacing of enbrel injections to every month due to low WBC count.

## 2019-08-10 NOTE — Progress Notes (Signed)
Creatinine is elevated-1.29 and GFR is low at 38.  Please notify patient and advise patient to avoid taking NSAIDs.  She is taking torsemide. Please forward labs to PCP.   WBC count is low-3.3.  Platelet count low but stable.  She has been off of Enbrel since December 2020.  Please advise patient to restart on Enbrel 50 mg sq injections once a month.  She will require spacing of enbrel injections to every month due to low WBC count. Recheck CBC and CMP 1 month after restarting on Enbrel.

## 2019-08-10 NOTE — Telephone Encounter (Signed)
Called [employer], they states patient called to say that she was denied for Medicare LIS, but they never received a copy of her Denial letter from [facility] Extra help.   Called patient, left message to inquire.  Ran test claim, patient's copay for 1 month of Enbrel is $1,781.90.  10:17 AM Beatriz Chancellor, CPhT

## 2019-08-14 NOTE — Telephone Encounter (Signed)
Attempted to call patient again, left voicemail. 

## 2019-08-16 ENCOUNTER — Telehealth: Payer: Self-pay | Admitting: Rheumatology

## 2019-08-16 NOTE — Telephone Encounter (Signed)
Patient left a voicemail stating she received a letter denying her prescription of Enbrel.  Patient states "she doesn't know where the letter is and might have thrown it away."  Patient is requesting a return call.

## 2019-08-16 NOTE — Telephone Encounter (Signed)
Returned patient's call, had to leave a message. Patient needs to submit a letter to [employer] from [facility] or Social Security that she did not qualify for Medicare Extra Help or Medicare LIS. If she has not applied she needs to call to apply: [phone].

## 2019-08-18 NOTE — Telephone Encounter (Signed)
Attempted to call patient again, left voicemail. 

## 2019-08-24 ENCOUNTER — Telehealth: Payer: Self-pay

## 2019-08-24 NOTE — Telephone Encounter (Signed)
Spoke to patient, and discussed applying for Medicare Extra Help. Patient will call and request determination letter. She will fax letter upon receipt.

## 2019-08-24 NOTE — Telephone Encounter (Signed)
Patient dose of Enbrel has now changed to 1 pen every 28 days due to WBC.  We will need to update PAP RX if approved.  She has received extra medication in the past which leads to non-compliance with labs.    Mariella Saa, PharmD, [place] Pharmacist [phone]  08/24/2019 10:08 AM

## 2019-08-25 ENCOUNTER — Encounter: Payer: Self-pay | Admitting: Internal Medicine

## 2019-08-25 ENCOUNTER — Telehealth: Payer: Self-pay

## 2019-08-25 ENCOUNTER — Other Ambulatory Visit: Payer: Self-pay

## 2019-08-25 ENCOUNTER — Ambulatory Visit (INDEPENDENT_AMBULATORY_CARE_PROVIDER_SITE_OTHER): Payer: PPO | Admitting: Internal Medicine

## 2019-08-25 DIAGNOSIS — G894 Chronic pain syndrome: Secondary | ICD-10-CM | POA: Diagnosis not present

## 2019-08-25 DIAGNOSIS — J431 Panlobular emphysema: Secondary | ICD-10-CM | POA: Diagnosis not present

## 2019-08-25 DIAGNOSIS — K582 Mixed irritable bowel syndrome: Secondary | ICD-10-CM

## 2019-08-25 DIAGNOSIS — M0609 Rheumatoid arthritis without rheumatoid factor, multiple sites: Secondary | ICD-10-CM

## 2019-08-25 MED ORDER — LIDOCAINE 5 % EX PTCH: 1.0000 | MEDICATED_PATCH | CUTANEOUS | 11 refills | Status: DC

## 2019-08-25 MED ORDER — SPIRIVA RESPIMAT 1.25 MCG/ACT IN AERS
2.0000 | INHALATION_SPRAY | Freq: Every day | RESPIRATORY_TRACT | 0 refills | Status: DC
Start: 2019-08-25 — End: 2020-01-30

## 2019-08-25 MED ORDER — TRELEGY ELLIPTA 100-62.5-25 MCG/INH IN AEPB: INHALATION_SPRAY | RESPIRATORY_TRACT | 11 refills | Status: DC

## 2019-08-25 MED ORDER — LORATADINE 10 MG PO TABS: 10.0000 mg | ORAL_TABLET | Freq: Every day | ORAL | 11 refills | Status: DC

## 2019-08-25 MED ORDER — VITAMIN D3 50 MCG (2000 UT) PO CAPS: 2000.0000 [IU] | ORAL_CAPSULE | Freq: Every day | ORAL | 3 refills | Status: DC

## 2019-08-25 NOTE — Telephone Encounter (Signed)
Key: [id number]

## 2019-08-25 NOTE — Assessment & Plan Note (Addendum)
Was on Oxycontin - stopped taking Lidoderm

## 2019-08-25 NOTE — Patient Instructions (Signed)
Use Breo and Spiriva

## 2019-08-25 NOTE — Assessment & Plan Note (Signed)
Probiotics prn

## 2019-08-25 NOTE — Telephone Encounter (Signed)
Lidocaine patches is not covered by Medicare.   Do you want to send in an alternative?

## 2019-08-25 NOTE — Assessment & Plan Note (Signed)
not getting Embrel since december due to cost - applying for [facility] supplement

## 2019-08-25 NOTE — Assessment & Plan Note (Signed)
Breo -  C/o cough: try Trelegy sample

## 2019-08-25 NOTE — Progress Notes (Signed)
Subjective:  Patient ID: Diane Abbott, female    DOB: 11-17-1934  Age: 85 y.o. MRN: [id number]  CC: No chief complaint on file.   HPI Diane Abbott presents for chronic pain, RA - not getting Embrel since december due to cost - applying for [facility] supplement F/u LBP, CRF, RA f/u C/o cough - chronic  Outpatient Medications Prior to Visit  Medication Sig Dispense Refill  . aspirin 81 MG tablet Take 1 tablet (81 mg total) by mouth daily.    Marland Kitchen etanercept (ENBREL SURECLICK) 50 MG/ML injection Inject 0.98 mLs (50 mg total) into the skin every 30 (thirty) days. 3 pen 0  . fluticasone furoate-vilanterol (BREO ELLIPTA) 100-25 MCG/INH AEPB INHALE 1 PUFF BY MOUTH ONCE DAILY 60 each 11  . rOPINIRole (REQUIP) 4 MG tablet Take 1 tablet (4 mg total) by mouth 3 (three) times daily. 90 tablet 11  . torsemide (DEMADEX) 20 MG tablet Take 40 mg by mouth 2 (two) times daily as needed (Swelling).      No facility-administered medications prior to visit.    ROS: Review of Systems  Constitutional: Positive for fatigue. Negative for activity change, appetite change, chills and unexpected weight change.  HENT: Negative for congestion, mouth sores and sinus pressure.   Eyes: Negative for visual disturbance.  Respiratory: Positive for cough. Negative for chest tightness.   Cardiovascular: Positive for leg swelling.  Gastrointestinal: Negative for abdominal pain and nausea.  Genitourinary: Negative for difficulty urinating, frequency and vaginal pain.  Musculoskeletal: Positive for arthralgias, back pain, gait problem, neck pain and neck stiffness.  Skin: Negative for pallor and rash.  Neurological: Positive for weakness. Negative for dizziness, tremors, numbness and headaches.  Psychiatric/Behavioral: Negative for confusion and sleep disturbance. The patient is nervous/anxious.     Objective:  BP (!) 156/86 (BP Location: Right Arm, Patient Position: Sitting, Cuff Size: Normal)   Pulse 72   Temp 98 F  (36.7 C) (Oral)   Ht 5' (1.524 m)   Wt 170 lb (77.1 kg)   SpO2 99%   BMI 33.20 kg/m   BP Readings from Last 3 Encounters:  08/25/19 (!) 156/86  08/09/19 139/73  07/11/19 136/72    Wt Readings from Last 3 Encounters:  08/25/19 170 lb (77.1 kg)  08/09/19 171 lb 3.2 oz (77.7 kg)  07/11/19 169 lb (76.7 kg)    Physical Exam Constitutional:      General: She is not in acute distress.    Appearance: She is well-developed.  HENT:     Head: Normocephalic.     Right Ear: External ear normal.     Left Ear: External ear normal.     Nose: Nose normal.  Eyes:     General:        Right eye: No discharge.        Left eye: No discharge.     Conjunctiva/sclera: Conjunctivae normal.     Pupils: Pupils are equal, round, and reactive to light.  Neck:     Thyroid: No thyromegaly.     Vascular: No JVD.     Trachea: No tracheal deviation.  Cardiovascular:     Rate and Rhythm: Normal rate and regular rhythm.     Heart sounds: Murmur present.  Pulmonary:     Effort: No respiratory distress.     Breath sounds: No stridor. No wheezing.  Abdominal:     General: Bowel sounds are normal. There is no distension.     Palpations: Abdomen is soft.  There is no mass.     Tenderness: There is no abdominal tenderness. There is no guarding or rebound.  Musculoskeletal:        General: Tenderness present.     Cervical back: Normal range of motion and neck supple.     Right lower leg: Edema present.     Left lower leg: Edema present.  Lymphadenopathy:     Cervical: No cervical adenopathy.  Skin:    Findings: No erythema or rash.  Neurological:     Mental Status: She is oriented to person, place, and time.     Cranial Nerves: No cranial nerve deficit.     Motor: No abnormal muscle tone.     Coordination: Coordination abnormal.     Gait: Gait abnormal.     Deep Tendon Reflexes: Reflexes normal.  Psychiatric:        Behavior: Behavior normal.        Thought Content: Thought content normal.          Judgment: Judgment normal.   walker LS spine, cerv spine painful w/ROM Trace edema  Lab Results  Component Value Date   WBC 3.3 (L) 08/09/2019   HGB 12.5 08/09/2019   HCT 37.4 08/09/2019   PLT 135 (L) 08/09/2019   GLUCOSE 98 08/09/2019   ALT 10 08/09/2019   AST 18 08/09/2019   NA 140 08/09/2019   K 5.0 08/09/2019   CL 105 08/09/2019   CREATININE 1.29 (H) 08/09/2019   BUN 24 08/09/2019   CO2 26 08/09/2019   TSH 3.95 04/08/2018   INR 1.48 06/29/2016   HGBA1C 5.5 06/24/2016    DG Shoulder Right  Result Date: 03/13/2019 CLINICAL DATA:  Right shoulder pain following a fall. EXAM: RIGHT SHOULDER - 2+ VIEW COMPARISON:  05/30/2005 FINDINGS: Mild greater tuberosity hyperostosis. Moderate superior acromioclavicular spur formation and superior subluxation of the distal clavicle relative to the acromion, not previously present. Normal coracoclavicular distance. Right axillary surgical clips. IMPRESSION: 1. Interval superior subluxation of the distal clavicle relative to the acromion. Otherwise, no fracture or dislocation seen. 2. Moderate superior spur formation arising from the acromion. 3. Greater tuberosity hyperostosis. This can be seen with chronic rotator cuff tendinosis. Electronically Signed   By: Claudie Revering M.D.   On: 03/13/2019 12:55   CT Head Wo Contrast  Result Date: 03/13/2019 CLINICAL DATA:  Head trauma, fall on marble floor with right frontal hematoma. EXAM: CT HEAD WITHOUT CONTRAST CT CERVICAL SPINE WITHOUT CONTRAST TECHNIQUE: Multidetector CT imaging of the head and cervical spine was performed following the standard protocol without intravenous contrast. Multiplanar CT image reconstructions of the cervical spine were also generated. COMPARISON:  10/17/2018 head and cervical spine CT. FINDINGS: CT HEAD FINDINGS Brain: No evidence of parenchymal hemorrhage or extra-axial fluid collection. No mass lesion, mass effect, or midline shift. No CT evidence of acute infarction.  Nonspecific minimal subcortical and periventricular white matter hypodensity, most in keeping with chronic small vessel ischemic change. Cerebral volume is age appropriate. No ventriculomegaly. Vascular: No acute abnormality. Skull: No evidence of calvarial fracture. Large anterior right frontal scalp hematoma. Sinuses/Orbits: The visualized paranasal sinuses are essentially clear. Other:  The mastoid air cells are unopacified. CT CERVICAL SPINE FINDINGS Alignment: Normal cervical lordosis. No facet subluxation. Dens is well positioned between the lateral masses of C1. Chronic minimal 2 mm anterolisthesis C3-4. Skull base and vertebrae: No acute fracture. Chronic moderate anterior T3 vertebral compression fracture is unchanged. No primary bone lesion or focal pathologic process. Soft  tissues and spinal canal: No prevertebral edema. No visible canal hematoma. Disc levels: Marked multilevel degenerative disc disease throughout the cervical spine, most prominent C3-4 and C4-5, unchanged. Moderate to severe multilevel facet arthropathy bilaterally. Mild degenerative foraminal stenosis on the right at C2-3 and C3-4 and bilaterally at C4-5 and C5-6. Upper chest: No acute abnormality. Other: Visualized mastoid air cells appear clear. No discrete thyroid nodules. No pathologically enlarged cervical nodes. IMPRESSION: 1. Large right frontal scalp hematoma. No evidence of acute intracranial abnormality. No evidence of calvarial fracture. 2. Minimal chronic small vessel ischemic changes in the cerebral white matter. 3. No cervical spine fracture or facet subluxation. 4. Advanced multilevel degenerative changes in the cervical spine as detailed. 5. Chronic moderate T3 vertebral compression fracture. Electronically Signed   By: Ilona Sorrel M.D.   On: 03/13/2019 12:51   CT Cervical Spine Wo Contrast  Result Date: 03/13/2019 CLINICAL DATA:  Head trauma, fall on marble floor with right frontal hematoma. EXAM: CT HEAD WITHOUT  CONTRAST CT CERVICAL SPINE WITHOUT CONTRAST TECHNIQUE: Multidetector CT imaging of the head and cervical spine was performed following the standard protocol without intravenous contrast. Multiplanar CT image reconstructions of the cervical spine were also generated. COMPARISON:  10/17/2018 head and cervical spine CT. FINDINGS: CT HEAD FINDINGS Brain: No evidence of parenchymal hemorrhage or extra-axial fluid collection. No mass lesion, mass effect, or midline shift. No CT evidence of acute infarction. Nonspecific minimal subcortical and periventricular white matter hypodensity, most in keeping with chronic small vessel ischemic change. Cerebral volume is age appropriate. No ventriculomegaly. Vascular: No acute abnormality. Skull: No evidence of calvarial fracture. Large anterior right frontal scalp hematoma. Sinuses/Orbits: The visualized paranasal sinuses are essentially clear. Other:  The mastoid air cells are unopacified. CT CERVICAL SPINE FINDINGS Alignment: Normal cervical lordosis. No facet subluxation. Dens is well positioned between the lateral masses of C1. Chronic minimal 2 mm anterolisthesis C3-4. Skull base and vertebrae: No acute fracture. Chronic moderate anterior T3 vertebral compression fracture is unchanged. No primary bone lesion or focal pathologic process. Soft tissues and spinal canal: No prevertebral edema. No visible canal hematoma. Disc levels: Marked multilevel degenerative disc disease throughout the cervical spine, most prominent C3-4 and C4-5, unchanged. Moderate to severe multilevel facet arthropathy bilaterally. Mild degenerative foraminal stenosis on the right at C2-3 and C3-4 and bilaterally at C4-5 and C5-6. Upper chest: No acute abnormality. Other: Visualized mastoid air cells appear clear. No discrete thyroid nodules. No pathologically enlarged cervical nodes. IMPRESSION: 1. Large right frontal scalp hematoma. No evidence of acute intracranial abnormality. No evidence of calvarial  fracture. 2. Minimal chronic small vessel ischemic changes in the cerebral white matter. 3. No cervical spine fracture or facet subluxation. 4. Advanced multilevel degenerative changes in the cervical spine as detailed. 5. Chronic moderate T3 vertebral compression fracture. Electronically Signed   By: Ilona Sorrel M.D.   On: 03/13/2019 12:51   DG Shoulder Left  Result Date: 03/13/2019 CLINICAL DATA:  Left shoulder pain following a fall. EXAM: LEFT SHOULDER - 2+ VIEW COMPARISON:  12/07/2011 FINDINGS: Stable left shoulder prosthesis. No fracture or dislocation. Mediastinal surgical clips and prosthetic aortic valve. IMPRESSION: Stable left shoulder prosthesis without fracture or dislocation. Electronically Signed   By: Claudie Revering M.D.   On: 03/13/2019 12:57    Assessment & Plan:    Walker Kehr, MD

## 2019-08-28 NOTE — Telephone Encounter (Addendum)
I am not aware of any alternatives either.

## 2019-08-28 NOTE — Telephone Encounter (Signed)
What do they offer instead? I'm not aware of any alternatives. Thx

## 2019-08-29 ENCOUNTER — Other Ambulatory Visit: Payer: Self-pay

## 2019-08-29 ENCOUNTER — Ambulatory Visit: Payer: PPO | Admitting: Interventional Cardiology

## 2019-08-29 ENCOUNTER — Encounter: Payer: Self-pay | Admitting: Interventional Cardiology

## 2019-08-29 VITALS — BP 144/76 | HR 88 | Ht 60.0 in | Wt 173.0 lb

## 2019-08-29 DIAGNOSIS — Z952 Presence of prosthetic heart valve: Secondary | ICD-10-CM

## 2019-08-29 DIAGNOSIS — I712 Thoracic aortic aneurysm, without rupture, unspecified: Secondary | ICD-10-CM

## 2019-08-29 DIAGNOSIS — Z7189 Other specified counseling: Secondary | ICD-10-CM | POA: Diagnosis not present

## 2019-08-29 DIAGNOSIS — N1831 Chronic kidney disease, stage 3a: Secondary | ICD-10-CM

## 2019-08-29 DIAGNOSIS — J431 Panlobular emphysema: Secondary | ICD-10-CM | POA: Diagnosis not present

## 2019-08-29 DIAGNOSIS — I5032 Chronic diastolic (congestive) heart failure: Secondary | ICD-10-CM

## 2019-08-29 NOTE — Telephone Encounter (Signed)
Prior auth came back approved. Pt has been informed of same.

## 2019-08-29 NOTE — Telephone Encounter (Signed)
Please inform the patient of the denial.  Thanks

## 2019-08-29 NOTE — Patient Instructions (Signed)

## 2019-08-29 NOTE — Progress Notes (Signed)
Cardiology Office Note:    Date:  08/29/2019   ID:  Diane Abbott, DOB Sep 11, 1934, MRN [id number]  PCP:  Cassandria Anger, MD  Cardiologist:  Sinclair Grooms, MD   Referring MD: Cassandria Anger, MD   Chief Complaint  Patient presents with  . Thoracic Aortic Aneurysm  . Cardiac Valve Problem    History of Present Illness:    Diane Abbott is a 85 y.o. female with a hx of OA, RA, PE/DVT, diastolic HF, and status post Bentall bioprosthetic aortic valve conduit and ascending aorta/proximal arch replacement in 2014. She has also had replacement of her aneurysmal aortic arch with aorto-innominate and aorto-left carotid artery bypasson 06/29/2016.She is no longer on anticoagulation - now just aspirin.   We had a nice conversation.  She has no specific cardiac complaints today.  More directly, she denies angina, orthopnea, peripheral edema, syncope, palpitations, and transient neurological complaints.  She is followed yearly by Dr. Mohammed Kindle because of aortic disease involving the ascending aorta and the arch.  The ascending aorta and arch were treated with surgery using the Bentall procedure in 2014.  The arch required therapy in 2018.  A region of the descending aorta is now being followed by Dr. Cyndia Bent.  There is an upcoming appointment.  Past Medical History:  Diagnosis Date  . Adrenal insufficiency ([place])   . Anemia   . Anginal pain [facility])    "comes and goes has occured since 2012"  . Blood dyscrasia    "problems with platelets, red cells and white cells being low"  . Bronchitis Jan 2013-March 2013   severe  . Cancer ([place])    skin cancer on face  . Cataracts, bilateral    more in left than right  . COPD (chronic obstructive pulmonary disease) ([place])   . Depression   . Esophagitis   . Fatty liver   . Fibromyalgia   . Fibromyalgia   . Foot pain, left 08/26/2016   2/18 1st MTP - gout vs other  . GERD (gastroesophageal reflux disease)   . Heart murmur   . History  of blood clots 2008   below knee post trauma. Dilated [facility] no clotting disorder felt to be present.  . History of echocardiogram    Echo 11/16: Mild LVH, EF 60-65%, normal wall motion, grade 1 diastolic dysfunction, bioprosthetic AVR with mean gradient 21 mmHg and no regurgitation, proximal aortic arch aneurysm 49 mm, moderate TR, PASP 31 mmHg  . IBS (irritable bowel syndrome)   . Leaky heart valve   . Osteoarthritis   . Osteoarthritis of left shoulder 12/07/2011  . Osteopenia   . Pulmonary embolism ([place])   . RA (rheumatoid arthritis) ([place])   . Shortness of breath    attributed to aneurysm  . Sliding hiatal hernia   . Urinary leakage    when coughing  . Vertigo    hx of  . Vitamin B 12 deficiency   . Vitamin D deficiency     Past Surgical History:  Procedure Laterality Date  . ABDOMINAL HYSTERECTOMY    . AORTA SURGERY  12/2012   Resection of aneurysm and valve replacement  . APPENDECTOMY  1956  . BENTALL PROCEDURE N/A 09/29/2012   Procedure: BENTALL PROCEDURE;  Surgeon: Gaye Pollack, MD;  Location: [place];  Service: Open Heart Surgery;  Laterality: N/A;  WITH CIRC ARREST  . BILATERAL OOPHORECTOMY    . BREAST BIOPSY Left   . CARDIAC CATHETERIZATION  09/15/12  no PCI  . CATARACT EXTRACTION Bilateral   . CHOLECYSTECTOMY  2003  . COLONOSCOPY    . COSMETIC SURGERY     Tummy tuck  . INTRAOPERATIVE TRANSESOPHAGEAL ECHOCARDIOGRAM N/A 09/29/2012   Procedure: INTRAOPERATIVE TRANSESOPHAGEAL ECHOCARDIOGRAM;  Surgeon: Gaye Pollack, MD;  Location: [facility] OR;  Service: Open Heart Surgery;  Laterality: N/A;  . JOINT REPLACEMENT Bilateral   . REPLACEMENT ASCENDING AORTA N/A 06/29/2016   Procedure: AORTIC ARCH REPLACEMENT WITH CIRC ARREST (N/A) - RIGHT AXILLARY CANNULATION  AORTO-AORTIC 28 HEMASHIELD GRAFT WITH AORTO-INNOMINATE AND AORTO-LEFT CAROTID ANASTAMOSIS;  Surgeon: Gaye Pollack, MD;  Location: [facility] OR;  Service: Open Heart Surgery;  Laterality: N/A;  RIGHT AXILLARY  CANNULATION  BILATERAL RADIAL A-LINE  . SKIN CANCER EXCISION Right    cheek  . TEE WITHOUT CARDIOVERSION N/A 06/29/2016   Procedure: TRANSESOPHAGEAL ECHOCARDIOGRAM (TEE);  Surgeon: Gaye Pollack, MD;  Location: [place];  Service: Open Heart Surgery;  Laterality: N/A;  . TONSILLECTOMY  1941  . TOTAL KNEE ARTHROPLASTY     bilateral  . TOTAL SHOULDER ARTHROPLASTY  12/07/2011   Procedure: TOTAL SHOULDER ARTHROPLASTY;  Surgeon: Johnny Bridge, MD;  Location: [place];  Service: Orthopedics;  Laterality: Left;  left total shoulder artthroplasty  . WRIST SURGERY     bilateral    Current Medications: Current Meds  Medication Sig  . aspirin 500 MG EC tablet Take 500 mg by mouth daily.  . Cholecalciferol (VITAMIN D3) 50 MCG (2000 UT) capsule Take 1 capsule (2,000 Units total) by mouth daily.  . fluticasone furoate-vilanterol (BREO ELLIPTA) 100-25 MCG/INH AEPB INHALE 1 PUFF BY MOUTH ONCE DAILY  . Fluticasone-Umeclidin-Vilant (TRELEGY ELLIPTA) 100-62.5-25 MCG/INH AEPB 1 inh daily  . lidocaine (LIDODERM) 5 % Place 1-2 patches onto the skin daily. Remove & Discard patch within 12 hours or as directed by MD  . loratadine (CLARITIN) 10 MG tablet Take 1 tablet (10 mg total) by mouth daily.  Marland Kitchen rOPINIRole (REQUIP) 4 MG tablet Take 1 tablet (4 mg total) by mouth 3 (three) times daily.  . Tiotropium Bromide Monohydrate (SPIRIVA RESPIMAT) 1.25 MCG/ACT AERS Inhale 2 sprays into the lungs daily.  Marland Kitchen torsemide (DEMADEX) 20 MG tablet Take 40 mg by mouth 2 (two) times daily as needed (Swelling).      Allergies:   Acetaminophen, Codeine, Pramipexole dihydrochloride, Rofecoxib, Venlafaxine, Arava [leflunomide], Clonazepam, Diclofenac sodium, Alprazolam, Darvocet [propoxyphene n-acetaminophen], Duloxetine, Gabapentin, Latex, Morphine and related, Nortriptyline hcl, Penicillins, and Prednisone   Social History   Socioeconomic History  . Marital status: Divorced    Spouse name: Not on file  . Number of children: Not  on file  . Years of education: Not on file  . Highest education level: Not on file  Occupational History  . Occupation: retired    Fish farm manager: RETIRED  Tobacco Use  . Smoking status: Never Smoker  . Smokeless tobacco: Never Used  . Tobacco comment: She describes COPD related to secondhand exposure from her family and husband  Substance and Sexual Activity  . Alcohol use: Yes    Comment: occ   . Drug use: Never  . Sexual activity: Not on file  Other Topics Concern  . Not on file  Social History Narrative  . Not on file   Social Determinants of Health   Financial Resource Strain:   . Difficulty of Paying Living Expenses:   Food Insecurity:   . Worried About Charity fundraiser in the Last Year:   . [employer]  in the Last Year:   Transportation Needs:   . Film/video editor (Medical):   Marland Kitchen Lack of Transportation (Non-Medical):   Physical Activity:   . Days of Exercise per Week:   . Minutes of Exercise per Session:   Stress:   . Feeling of Stress :   Social Connections:   . Frequency of Communication with Friends and Family:   . Frequency of Social Gatherings with Friends and Family:   . Attends Religious Services:   . Active Member of Clubs or Organizations:   . Attends Archivist Meetings:   Marland Kitchen Marital Status:      Family History: The patient's family history includes Breast cancer in an other family member; COPD in her father; Clotting disorder in an other family member; Diabetes in her mother; Heart disease in her maternal grandmother; Ovarian cancer in her mother; Pancreatic cancer in her paternal uncle. There is no history of Colon cancer.  ROS:   Please see the history of present illness.    Back discomfort neck discomfort and lower back pain related to the spine.  She also has rheumatoid arthritis at impacts her hands.  She has had bilateral knee replacement.  She has swelling of the left lower extremity and swelling in the left knee which has been  replaced.  All other systems reviewed and are negative.  EKGs/Labs/Other Studies Reviewed:    The following studies were reviewed today:  Chest CT angiogram including abdomen June 2020: IMPRESSION: 1. Unchanged postoperative appearance of the aorta, with aortic valve replacement and graft repair of the tubular ascending aorta, including reimplantation of the innominate and left common carotid artery origins. Unchanged aneurysmal dilatation of the native descending thoracic aorta, maximum caliber 4.2 x 4.0 cm.  2. Tortuous abdominal aorta measuring up to 2.5 x 2.4 cm in the distal aorta. Standard branching pattern of the abdominal aorta. Mixed calcific atherosclerosis without evidence of significant branch vessel stenosis.  3. No evidence of dissection, penetrating ulcer, or other acute aortic syndrome.  4. Bibasilar atelectasis or scarring with scattered, nonspecific ground-glass opacities in the left lung base, unchanged from prior examinations.  5.  Other chronic, incidental, and postoperative findings as above.   2D Doppler echocardiogram December 28, 2018: IMPRESSIONS    1. The left ventricle has normal systolic function with an ejection  fraction of 60-65%. The cavity size was normal. Left ventricular diastolic  Doppler parameters are consistent with pseudonormalization.  2. The right ventricle has normal systolic function. The cavity was  normal. There is no increase in right ventricular wall thickness.  3. No evidence of mitral valve stenosis.  4. No stenosis of the aortic valve.  5. The aorta is normal unless otherwise noted.  6. The aortic root and ascending aorta are normal in size and structure.  7. The atrial septum is grossly normal. Aortic Valve: The aortic valve has been repaired/replaced Aortic valve  regurgitation was not visualized by color flow Doppler. There is No  stenosis of the aortic valve, with a calculated valve area of 1.47 cm.     Coronary angiography: 2014: Coronary dominance: right  Left mainstem: No significant disease.   Left anterior descending (LAD): Mild luminal irregularities.   Left circumflex (LCx): No significant disease.  There is a small to moderate ramus and a large PLOM.   Right coronary artery (RCA): No significant disease.   Aortic root angiography: 3+ aortic insufficiency.  Aneurysmal dilatation from the sinotubular junction to the proximal aortic arch.  Final Conclusions:  No significant CAD.  3+ aortic insufficiency.     EKG:  EKG sinus rhythm, left atrial abnormality, when compared to prior EKGs, the heart rate is faster.  Recent Labs: 11/24/2018: Magnesium 2.1 08/09/2019: ALT 10; BUN 24; Creat 1.29; Hemoglobin 12.5; Platelets 135; Potassium 5.0; Sodium 140  Recent Lipid Panel No results found for: CHOL, TRIG, HDL, CHOLHDL, VLDL, LDLCALC, LDLDIRECT  Physical Exam:    VS:  BP (!) 144/76   Pulse 88   Ht 5' (1.524 m)   Wt 173 lb (78.5 kg)   SpO2 97%   BMI 33.79 kg/m     Wt Readings from Last 3 Encounters:  08/29/19 173 lb (78.5 kg)  08/25/19 170 lb (77.1 kg)  08/09/19 171 lb 3.2 oz (77.7 kg)     GEN: Compatible with her stated age. No acute distress HEENT: Normal NECK: No JVD.  Transmitted bilateral bruit from the aortic outflow area. LYMPHATICS: No lymphadenopathy CARDIAC: 2/6 course diamond-shaped systolic murmur at right upper sternal border.  RRR without diastolic murmur, gallop, or edema. VASCULAR:  Normal Pulses. No bruits. RESPIRATORY:  Clear to auscultation without rales, wheezing or rhonchi  ABDOMEN: Soft, non-tender, non-distended, No pulsatile mass, MUSCULOSKELETAL: No deformity  SKIN: Warm and dry NEUROLOGIC:  Alert and oriented x 3 PSYCHIATRIC:  Normal affect   ASSESSMENT:    1. Chronic diastolic heart failure ([place] )   2. S/P AVR (aortic valve replacement)   3. Chronic renal impairment, stage 3a   4. Panlobular emphysema ([place])   5. Educated  about COVID-19 virus infection   6. Thoracic aortic aneurysm without rupture ([place])    PLAN:    In order of problems listed above:  1. There is no clinical evidence of volume overload. 2. There is a coarse 2/6 to 3/6 systolic murmur right upper sternal border that is transmitted into the carotids bilaterally.  Based upon echo interpretation, the valve seems to be doing well without mention of excessive thickness or leak. 3. Not discussed.  Most recent creatinine was 1.3. 4. Dyspnea could be related to emphysema. 5. She has received a COVID-19 vaccine and is practicing social distancing. 6. Follow-up with Dr. Cyndia Bent for aortic aneurysm next month.   Medication Adjustments/Labs and Tests Ordered: Current medicines are reviewed at length with the patient today.  Concerns regarding medicines are outlined above.  Orders Placed This Encounter  Procedures  . EKG 12-Lead   No orders of the defined types were placed in this encounter.   Patient Instructions  Medication Instructions:  Your physician recommends that you continue on your current medications as directed. Please refer to the Current Medication list given to you today.  *If you need a refill on your cardiac medications before your next appointment, please call your pharmacy*   Lab Work: None If you have labs (blood work) drawn today and your tests are completely normal, you will receive your results only by: Marland Kitchen MyChart Message (if you have MyChart) OR . A paper copy in the mail If you have any lab test that is abnormal or we need to change your treatment, we will call you to review the results.   Testing/Procedures: None   Follow-Up: At [facility], you and your health needs are our priority.  As part of our continuing mission to provide you with exceptional heart care, we have created designated Provider Care Teams.  These Care Teams include your primary Cardiologist (physician) and Advanced Practice Providers (APPs -   Physician Assistants  and Nurse Practitioners) who all work together to provide you with the care you need, when you need it.  We recommend signing up for the patient portal called "MyChart".  Sign up information is provided on this After Visit Summary.  MyChart is used to connect with patients for Virtual Visits (Telemedicine).  Patients are able to view lab/test results, encounter notes, upcoming appointments, etc.  Non-urgent messages can be sent to your provider as well.   To learn more about what you can do with MyChart, go to [email or web address].    Your next appointment:   12 month(s)  The format for your next appointment:   In Person  Provider:   You may see Sinclair Grooms, MD or one of the following Advanced Practice Providers on your designated Care Team:    Truitt Merle, NP  Cecilie Kicks, NP  Kathyrn Drown, NP    Other Instructions      Signed, Sinclair Grooms, MD  08/29/2019 3:29 PM    [place]

## 2019-08-30 ENCOUNTER — Ambulatory Visit: Payer: PPO | Attending: Internal Medicine

## 2019-08-30 DIAGNOSIS — Z23 Encounter for immunization: Secondary | ICD-10-CM

## 2019-08-30 NOTE — Progress Notes (Signed)
   Covid-19 Vaccination Clinic  Name:  Diane Abbott    MRN: [id number] DOB: May 25, 1934  08/30/2019  [name] was observed post Covid-19 immunization for 15 minutes without incident. She was provided with Vaccine Information Sheet and instruction to access the V-Safe system.   [name] was instructed to call 911 with any severe reactions post vaccine: Marland Kitchen Difficulty breathing  . Swelling of face and throat  . A fast heartbeat  . A bad rash all over body  . Dizziness and weakness   Immunizations Administered    Name Date Dose VIS Date Route   Pfizer COVID-19 Vaccine 08/30/2019  1:15 PM 0.3 mL 06/28/2018 Intramuscular   Manufacturer: [place]   Lot: [id number]   [place]: [id number]

## 2019-09-11 ENCOUNTER — Telehealth: Payer: Self-pay | Admitting: Rheumatology

## 2019-09-11 NOTE — Telephone Encounter (Signed)
FYI: Patient calling in refernce to Enbrel. Medicare refused assistance for Enbrel. Patient will bring in letter tomorrow am. Per patient, she was asked to bring letter in if, and when she receives it.

## 2019-09-12 NOTE — Telephone Encounter (Signed)
Patient was denied Enbrel patient assistance due to potentially qualifying for Medicare low income subsidy Extra Help.  Will fax denial letter and resubmit for approval for Enbrel patient assistance once received.   Mariella Saa, PharmD, [place], [place] Clinical Specialty Pharmacist [phone]  09/12/2019 8:38 AM

## 2019-09-13 NOTE — Telephone Encounter (Signed)
Received denial letter from patient.  Faxed to [facility].  Consideration of approval.  We will update him only receive a response.  Fax [phone]  Mariella Saa, PharmD, [place], CPP Clinical Specialty Pharmacist [phone]  09/13/2019 8:30 AM

## 2019-09-20 NOTE — Telephone Encounter (Signed)
Called [employer] to check status of patient's PAP enrollment. Rep advised that patient's case was closed after 3 months and a new dated application will need to be sent to program. Faxed. Will update once we receive a response.  Phone# [phone]

## 2019-09-25 ENCOUNTER — Other Ambulatory Visit: Payer: Self-pay | Admitting: Surgery

## 2019-09-25 DIAGNOSIS — I712 Thoracic aortic aneurysm, without rupture, unspecified: Secondary | ICD-10-CM

## 2019-09-28 NOTE — Telephone Encounter (Signed)
Called [employer] to check status, they have been trying to reach out to patient to verify household size and income discrepancy. LIS lists household size of 1, but patient's application states her daughter resides with her. Patient can call to clarify over the phone. Called patient, left detailed message with [employer] phone number.  Phone# [phone], option 3

## 2019-10-06 NOTE — Telephone Encounter (Signed)
Attempted to call patient to follow up, voicemail is full.

## 2019-10-11 ENCOUNTER — Ambulatory Visit: Payer: PPO | Admitting: Internal Medicine

## 2019-10-17 ENCOUNTER — Encounter: Payer: Self-pay | Admitting: Internal Medicine

## 2019-10-17 ENCOUNTER — Ambulatory Visit (INDEPENDENT_AMBULATORY_CARE_PROVIDER_SITE_OTHER): Payer: PPO | Admitting: Internal Medicine

## 2019-10-17 ENCOUNTER — Other Ambulatory Visit: Payer: Self-pay

## 2019-10-17 VITALS — BP 160/40 | HR 78 | Temp 98.2°F | Ht 60.0 in | Wt 157.0 lb

## 2019-10-17 DIAGNOSIS — I712 Thoracic aortic aneurysm, without rupture, unspecified: Secondary | ICD-10-CM

## 2019-10-17 DIAGNOSIS — B3781 Candidal esophagitis: Secondary | ICD-10-CM | POA: Diagnosis not present

## 2019-10-17 DIAGNOSIS — B370 Candidal stomatitis: Secondary | ICD-10-CM | POA: Diagnosis not present

## 2019-10-17 DIAGNOSIS — N390 Urinary tract infection, site not specified: Secondary | ICD-10-CM

## 2019-10-17 DIAGNOSIS — K582 Mixed irritable bowel syndrome: Secondary | ICD-10-CM | POA: Diagnosis not present

## 2019-10-17 MED ORDER — SULFAMETHOXAZOLE-TRIMETHOPRIM 800-160 MG PO TABS
1.0000 | ORAL_TABLET | Freq: Two times a day (BID) | ORAL | 1 refills | Status: DC
Start: 2019-10-17 — End: 2020-01-22

## 2019-10-17 MED ORDER — HYOSCYAMINE SULFATE 0.125 MG PO TABS
0.1250 mg | ORAL_TABLET | ORAL | 3 refills | Status: DC | PRN
Start: 2019-10-17 — End: 2020-12-09

## 2019-10-17 MED ORDER — FLUCONAZOLE 100 MG PO TABS
200.0000 mg | ORAL_TABLET | Freq: Every day | ORAL | 1 refills | Status: DC
Start: 2019-10-17 — End: 2020-05-28

## 2019-10-17 NOTE — Progress Notes (Signed)
Subjective:  Patient ID: Diane Abbott, female    DOB: 08-21-1934  Age: 85 y.o. MRN: [id number]  CC: No chief complaint on file.   HPI Diane Abbott presents for abd cramps - Levsin helped in the past BP ok at home  Outpatient Medications Prior to Visit  Medication Sig Dispense Refill  . aspirin 500 MG EC tablet Take 500 mg by mouth daily.    . Cholecalciferol (VITAMIN D3) 50 MCG (2000 UT) capsule Take 1 capsule (2,000 Units total) by mouth daily. 100 capsule 3  . fluticasone furoate-vilanterol (BREO ELLIPTA) 100-25 MCG/INH AEPB INHALE 1 PUFF BY MOUTH ONCE DAILY 60 each 11  . Fluticasone-Umeclidin-Vilant (TRELEGY ELLIPTA) 100-62.5-25 MCG/INH AEPB 1 inh daily 1 each 11  . lidocaine (LIDODERM) 5 % Place 1-2 patches onto the skin daily. Remove & Discard patch within 12 hours or as directed by MD 60 patch 11  . loratadine (CLARITIN) 10 MG tablet Take 1 tablet (10 mg total) by mouth daily. 30 tablet 11  . rOPINIRole (REQUIP) 4 MG tablet Take 1 tablet (4 mg total) by mouth 3 (three) times daily. 90 tablet 11  . Tiotropium Bromide Monohydrate (SPIRIVA RESPIMAT) 1.25 MCG/ACT AERS Inhale 2 sprays into the lungs daily. 4 g 0  . torsemide (DEMADEX) 20 MG tablet Take 40 mg by mouth 2 (two) times daily as needed (Swelling).      No facility-administered medications prior to visit.    ROS: Review of Systems  Constitutional: Positive for fatigue. Negative for activity change, appetite change, chills and unexpected weight change.  HENT: Negative for congestion, mouth sores and sinus pressure.   Eyes: Negative for visual disturbance.  Respiratory: Negative for cough and chest tightness.   Gastrointestinal: Negative for abdominal pain and nausea.  Genitourinary: Negative for difficulty urinating, frequency and vaginal pain.  Musculoskeletal: Positive for arthralgias, back pain, gait problem and neck pain. Negative for joint swelling.  Skin: Negative for pallor and rash.  Neurological: Positive  for weakness. Negative for dizziness, tremors, numbness and headaches.  Psychiatric/Behavioral: Positive for decreased concentration. Negative for confusion and sleep disturbance.    Objective:  BP (!) 160/40 (BP Location: Left Arm, Patient Position: Sitting, Cuff Size: Normal)   Pulse 78   Temp 98.2 F (36.8 C) (Oral)   Ht 5' (1.524 m)   Wt 157 lb (71.2 kg)   SpO2 97%   BMI 30.66 kg/m   BP Readings from Last 3 Encounters:  10/17/19 (!) 160/40  08/29/19 (!) 144/76  08/25/19 (!) 156/86    Wt Readings from Last 3 Encounters:  10/17/19 157 lb (71.2 kg)  08/29/19 173 lb (78.5 kg)  08/25/19 170 lb (77.1 kg)    Physical Exam Constitutional:      General: She is not in acute distress.    Appearance: She is well-developed.  HENT:     Head: Normocephalic.     Right Ear: External ear normal.     Left Ear: External ear normal.     Nose: Nose normal.  Eyes:     General:        Right eye: No discharge.        Left eye: No discharge.     Conjunctiva/sclera: Conjunctivae normal.     Pupils: Pupils are equal, round, and reactive to light.  Neck:     Thyroid: No thyromegaly.     Vascular: No JVD.     Trachea: No tracheal deviation.  Cardiovascular:     Rate and  Rhythm: Normal rate and regular rhythm.     Heart sounds: Normal heart sounds.  Pulmonary:     Effort: No respiratory distress.     Breath sounds: No stridor. No wheezing.  Abdominal:     General: Bowel sounds are normal. There is no distension.     Palpations: Abdomen is soft. There is no mass.     Tenderness: There is no abdominal tenderness. There is no guarding or rebound.  Musculoskeletal:        General: Tenderness present.     Cervical back: Normal range of motion and neck supple.  Lymphadenopathy:     Cervical: No cervical adenopathy.  Skin:    Findings: No erythema or rash.  Neurological:     Cranial Nerves: No cranial nerve deficit.     Motor: No abnormal muscle tone.     Coordination: Coordination  normal.     Gait: Gait abnormal.     Deep Tendon Reflexes: Reflexes normal.  Psychiatric:        Behavior: Behavior normal.        Thought Content: Thought content normal.        Judgment: Judgment normal.    LS tender  Lab Results  Component Value Date   WBC 3.3 (L) 08/09/2019   HGB 12.5 08/09/2019   HCT 37.4 08/09/2019   PLT 135 (L) 08/09/2019   GLUCOSE 98 08/09/2019   ALT 10 08/09/2019   AST 18 08/09/2019   NA 140 08/09/2019   K 5.0 08/09/2019   CL 105 08/09/2019   CREATININE 1.29 (H) 08/09/2019   BUN 24 08/09/2019   CO2 26 08/09/2019   TSH 3.95 04/08/2018   INR 1.48 06/29/2016   HGBA1C 5.5 06/24/2016    DG Shoulder Right  Result Date: 03/13/2019 CLINICAL DATA:  Right shoulder pain following a fall. EXAM: RIGHT SHOULDER - 2+ VIEW COMPARISON:  05/30/2005 FINDINGS: Mild greater tuberosity hyperostosis. Moderate superior acromioclavicular spur formation and superior subluxation of the distal clavicle relative to the acromion, not previously present. Normal coracoclavicular distance. Right axillary surgical clips. IMPRESSION: 1. Interval superior subluxation of the distal clavicle relative to the acromion. Otherwise, no fracture or dislocation seen. 2. Moderate superior spur formation arising from the acromion. 3. Greater tuberosity hyperostosis. This can be seen with chronic rotator cuff tendinosis. Electronically Signed   By: Claudie Revering M.D.   On: 03/13/2019 12:55   CT Head Wo Contrast  Result Date: 03/13/2019 CLINICAL DATA:  Head trauma, fall on marble floor with right frontal hematoma. EXAM: CT HEAD WITHOUT CONTRAST CT CERVICAL SPINE WITHOUT CONTRAST TECHNIQUE: Multidetector CT imaging of the head and cervical spine was performed following the standard protocol without intravenous contrast. Multiplanar CT image reconstructions of the cervical spine were also generated. COMPARISON:  10/17/2018 head and cervical spine CT. FINDINGS: CT HEAD FINDINGS Brain: No evidence of  parenchymal hemorrhage or extra-axial fluid collection. No mass lesion, mass effect, or midline shift. No CT evidence of acute infarction. Nonspecific minimal subcortical and periventricular white matter hypodensity, most in keeping with chronic small vessel ischemic change. Cerebral volume is age appropriate. No ventriculomegaly. Vascular: No acute abnormality. Skull: No evidence of calvarial fracture. Large anterior right frontal scalp hematoma. Sinuses/Orbits: The visualized paranasal sinuses are essentially clear. Other:  The mastoid air cells are unopacified. CT CERVICAL SPINE FINDINGS Alignment: Normal cervical lordosis. No facet subluxation. Dens is well positioned between the lateral masses of C1. Chronic minimal 2 mm anterolisthesis C3-4. Skull base and vertebrae: No acute  fracture. Chronic moderate anterior T3 vertebral compression fracture is unchanged. No primary bone lesion or focal pathologic process. Soft tissues and spinal canal: No prevertebral edema. No visible canal hematoma. Disc levels: Marked multilevel degenerative disc disease throughout the cervical spine, most prominent C3-4 and C4-5, unchanged. Moderate to severe multilevel facet arthropathy bilaterally. Mild degenerative foraminal stenosis on the right at C2-3 and C3-4 and bilaterally at C4-5 and C5-6. Upper chest: No acute abnormality. Other: Visualized mastoid air cells appear clear. No discrete thyroid nodules. No pathologically enlarged cervical nodes. IMPRESSION: 1. Large right frontal scalp hematoma. No evidence of acute intracranial abnormality. No evidence of calvarial fracture. 2. Minimal chronic small vessel ischemic changes in the cerebral white matter. 3. No cervical spine fracture or facet subluxation. 4. Advanced multilevel degenerative changes in the cervical spine as detailed. 5. Chronic moderate T3 vertebral compression fracture. Electronically Signed   By: Ilona Sorrel M.D.   On: 03/13/2019 12:51   CT Cervical Spine Wo  Contrast  Result Date: 03/13/2019 CLINICAL DATA:  Head trauma, fall on marble floor with right frontal hematoma. EXAM: CT HEAD WITHOUT CONTRAST CT CERVICAL SPINE WITHOUT CONTRAST TECHNIQUE: Multidetector CT imaging of the head and cervical spine was performed following the standard protocol without intravenous contrast. Multiplanar CT image reconstructions of the cervical spine were also generated. COMPARISON:  10/17/2018 head and cervical spine CT. FINDINGS: CT HEAD FINDINGS Brain: No evidence of parenchymal hemorrhage or extra-axial fluid collection. No mass lesion, mass effect, or midline shift. No CT evidence of acute infarction. Nonspecific minimal subcortical and periventricular white matter hypodensity, most in keeping with chronic small vessel ischemic change. Cerebral volume is age appropriate. No ventriculomegaly. Vascular: No acute abnormality. Skull: No evidence of calvarial fracture. Large anterior right frontal scalp hematoma. Sinuses/Orbits: The visualized paranasal sinuses are essentially clear. Other:  The mastoid air cells are unopacified. CT CERVICAL SPINE FINDINGS Alignment: Normal cervical lordosis. No facet subluxation. Dens is well positioned between the lateral masses of C1. Chronic minimal 2 mm anterolisthesis C3-4. Skull base and vertebrae: No acute fracture. Chronic moderate anterior T3 vertebral compression fracture is unchanged. No primary bone lesion or focal pathologic process. Soft tissues and spinal canal: No prevertebral edema. No visible canal hematoma. Disc levels: Marked multilevel degenerative disc disease throughout the cervical spine, most prominent C3-4 and C4-5, unchanged. Moderate to severe multilevel facet arthropathy bilaterally. Mild degenerative foraminal stenosis on the right at C2-3 and C3-4 and bilaterally at C4-5 and C5-6. Upper chest: No acute abnormality. Other: Visualized mastoid air cells appear clear. No discrete thyroid nodules. No pathologically enlarged  cervical nodes. IMPRESSION: 1. Large right frontal scalp hematoma. No evidence of acute intracranial abnormality. No evidence of calvarial fracture. 2. Minimal chronic small vessel ischemic changes in the cerebral white matter. 3. No cervical spine fracture or facet subluxation. 4. Advanced multilevel degenerative changes in the cervical spine as detailed. 5. Chronic moderate T3 vertebral compression fracture. Electronically Signed   By: Ilona Sorrel M.D.   On: 03/13/2019 12:51   DG Shoulder Left  Result Date: 03/13/2019 CLINICAL DATA:  Left shoulder pain following a fall. EXAM: LEFT SHOULDER - 2+ VIEW COMPARISON:  12/07/2011 FINDINGS: Stable left shoulder prosthesis. No fracture or dislocation. Mediastinal surgical clips and prosthetic aortic valve. IMPRESSION: Stable left shoulder prosthesis without fracture or dislocation. Electronically Signed   By: Claudie Revering M.D.   On: 03/13/2019 12:57    Assessment & Plan:   There are no diagnoses linked to this encounter.   No  orders of the defined types were placed in this encounter.    Follow-up: No follow-ups on file.  Walker Kehr, MD

## 2019-10-18 ENCOUNTER — Ambulatory Visit: Payer: PPO | Admitting: Surgery

## 2019-10-18 ENCOUNTER — Encounter: Payer: Self-pay | Admitting: Surgery

## 2019-10-18 ENCOUNTER — Ambulatory Visit
Admission: RE | Admit: 2019-10-18 | Discharge: 2019-10-18 | Disposition: A | Discharge status: Home / Self Care | Payer: PPO | Source: Ambulatory Visit | Attending: Surgery | Admitting: Surgery

## 2019-10-18 VITALS — BP 160/82 | HR 80 | Temp 96.8°F | Resp 16 | Ht 60.0 in | Wt 157.0 lb

## 2019-10-18 DIAGNOSIS — Z8679 Personal history of other diseases of the circulatory system: Secondary | ICD-10-CM | POA: Diagnosis not present

## 2019-10-18 DIAGNOSIS — Z9889 Other specified postprocedural states: Secondary | ICD-10-CM | POA: Diagnosis not present

## 2019-10-18 DIAGNOSIS — I712 Thoracic aortic aneurysm, without rupture, unspecified: Secondary | ICD-10-CM

## 2019-10-18 DIAGNOSIS — Z95828 Presence of other vascular implants and grafts: Secondary | ICD-10-CM | POA: Diagnosis not present

## 2019-10-18 DIAGNOSIS — Z952 Presence of prosthetic heart valve: Secondary | ICD-10-CM

## 2019-10-18 LAB — URINALYSIS, ROUTINE W REFLEX MICROSCOPIC
Bilirubin Urine: NEGATIVE
Ketones, ur: NEGATIVE
Nitrite: NEGATIVE
Specific Gravity, Urine: 1.02 (ref 1.000–1.030)
Total Protein, Urine: NEGATIVE
Urine Glucose: NEGATIVE
Urobilinogen, UA: 0.2 (ref 0.0–1.0)
pH: 5.5 (ref 5.0–8.0)

## 2019-10-18 MED ORDER — IOPAMIDOL (ISOVUE-370) INJECTION 76%
50.0000 mL | Freq: Once | INTRAVENOUS | Status: AC | PRN
  Administered 2019-10-18: 50 mL via INTRAVENOUS

## 2019-10-18 NOTE — Telephone Encounter (Signed)
Attempted to call patient again, left voicemail.

## 2019-10-18 NOTE — Progress Notes (Signed)
HPI:  The patient is an 85 year old woman with rheumatoid arthritis, fibromyalgia, IBS, DM and multiple other medial problems who underwent Bentall procedure using a composite pericardial valve/conduit and replacement of the ascending aorta and proximal aortic arch on 09/29/2012.She subsequently developed progressive enlargement of her aortic arch to 5.3 cm and underwent replacement of her aneurysmal aortic arch with aorto-innominate and aorto-left carotid artery bypasson 06/29/2016.  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  Since I saw her last June she has been feeling fairly well.  She is walking with a walker.  She has had some vague left-sided chest discomfort that is not necessarily related to exertion.  She is concerned that this might be coming from her aorta.  Current Outpatient Medications  Medication Sig Dispense Refill  . aspirin 500 MG EC tablet Take 500 mg by mouth daily.    . Cholecalciferol (VITAMIN D3) 50 MCG (2000 UT) capsule Take 1 capsule (2,000 Units total) by mouth daily. 100 capsule 3  . fluconazole (DIFLUCAN) 100 MG tablet Take 2 tablets (200 mg total) by mouth daily. Take 2 tabs on day#1, then 1 tab daily on Days #2-10 14 tablet 1  . fluticasone furoate-vilanterol (BREO ELLIPTA) 100-25 MCG/INH AEPB INHALE 1 PUFF BY MOUTH ONCE DAILY 60 each 11  . Fluticasone-Umeclidin-Vilant (TRELEGY ELLIPTA) 100-62.5-25 MCG/INH AEPB 1 inh daily 1 each 11  . hyoscyamine (LEVSIN) 0.125 MG tablet Take 1 tablet (0.125 mg total) by mouth every 4 (four) hours as needed for up to 10 days. 100 tablet 3  . lidocaine (LIDODERM) 5 % Place 1-2 patches onto the skin daily. Remove & Discard patch within 12 hours or as directed by MD 60 patch 11  . loratadine (CLARITIN) 10 MG tablet Take 1 tablet (10 mg total) by mouth daily. 30 tablet 11    . rOPINIRole (REQUIP) 4 MG tablet Take 1 tablet (4 mg total) by mouth 3 (three) times daily. 90 tablet 11  . sulfamethoxazole-trimethoprim (BACTRIM DS) 800-160 MG tablet Take 1 tablet by mouth 2 (two) times daily. 14 tablet 1  . Tiotropium Bromide Monohydrate (SPIRIVA RESPIMAT) 1.25 MCG/ACT AERS Inhale 2 sprays into the lungs daily. 4 g 0  . torsemide (DEMADEX) 20 MG tablet Take 40 mg by mouth 2 (two) times daily as needed (Swelling).      No current facility-administered medications for this visit.     Physical Exam: BP (!) 160/82 (BP Location: Right Arm, Patient Position: Sitting, Cuff Size: Normal)   Pulse 80   Temp (!) 96.8 F (36 C)   Resp 16   Ht 5' (1.524 m)   Wt 71.2 kg   SpO2 95% Comment: RA  BMI 30.66 kg/m  She looks well. Cardiac exam shows a regular rate and rhythm with a 2/6 systolic murmur along the right sternal border.  There is no diastolic murmur. Lungs are clear.  Diagnostic Tests:  CLINICAL DATA:  Thoracic aortic aneurysm, status post surgical repair, CT surveillance  EXAM: CT ANGIOGRAPHY CHEST WITH CONTRAST  TECHNIQUE: Multidetector CT imaging of the chest was performed using the standard protocol during bolus administration of intravenous contrast. Multiplanar CT image reconstructions and MIPs were obtained to evaluate the vascular anatomy.  CONTRAST:  27mL ISOVUE-370 IOPAMIDOL (ISOVUE-370) INJECTION 76%  COMPARISON:  10/05/2018  FINDINGS: Cardiovascular: Patient is status  post aortic valve replacement and reconstruction of the ascending aorta and transverse aorta. Stable postoperative changes with wide patency of the reconstruction and the reimplanted major branch vessels. Stable outpouching of the reconstructed aortic arch measuring 10 x 5 mm and compatible with a surgical cannulation site.  Descending thoracic aorta has similar aneurysmal dilatation proximally measuring 4.1 cm.  However, the mid descending thoracic aorta has  increased aneurysmal dilatation measuring 4.1 cm, previously 3.4 cm. At this level, there is wall thickening and broad-based left anterolateral aortic wall outpouching, beginning on image 59 through image 64 series 6. This is also present on the coronal reconstructions, image 115 series 11. Appearance is concerning for a broad-base penetrating atherosclerotic ulcer with increased aneurysmal dilatation of the mid descending thoracic aorta. No associated acute dissection, mediastinal hemorrhage, or hematoma.  At the diaphragmatic hiatus, aorta remains elongated and tortuous but stable in diameter at 2.6 cm.  Normal heart size.  No pericardial effusion.  Mediastinum/Nodes: Stable mild pretracheal adenopathy measuring 2.2 cm in short axis. No new bulky adenopathy. Thyroid, trachea and esophagus unremarkable.  Lungs/Pleura: Minor basilar atelectasis. No acute airspace process, collapse or consolidation. Negative for edema, pneumothorax. Interstitial process.  Upper Abdomen: Remote cholecystectomy. No acute upper abdominal finding.  Musculoskeletal: Degenerative changes spine. Remote left shoulder arthroplasty changes creating artifact.  Review of the MIP images confirms the above findings.  IMPRESSION: Stable surgical repair of the aortic valve, ascending thoracic aorta and transverse aorta.  New left anterolateral wall irregularity of the mid descending thoracic aorta having a broad-based appearance on coronal imaging with associated increased aneurysmal dilatation now measuring 4.1 cm, previously 3.4 cm. Appearance is concerning for a broad-based penetrating atherosclerotic ulcer of the mid descending thoracic aorta.  No current evidence of acute dissection, mediastinal hemorrhage or hematoma.  No other acute intrathoracic finding.  Aortic Atherosclerosis (ICD10-I70.0).  These results will be called to the ordering clinician or representative by the  Radiologist Assistant, and communication documented in the PACS or [employer].   Electronically Signed   By: Jerilynn Mages.  Shick M.D.   On: 10/18/2019 15:29  Impression:  The ascending aortic graft, aortic arch graft, and branch graft to the innominate and left common carotid artery all look normal.  There are some changes in the descending thoracic aorta compared to last year.  There is wall thickening and a broad-based left anterior lateral aortic wall outpouching in the mid descending thoracic aorta which was not present 1 year ago.  The diameter in this area is 3.9 cm by my measurement compared to 3.4 cm at this level 1 year ago.  The appearance is concerning for a broad-based penetrating atherosclerotic ulcer.  I think this is still below the threshold for intervention but will require continued follow-up.  I reviewed the CT images with the patient and answered her questions.  I think it is reasonable to repeat her CTA in 6 months to see if there has been any further progression.  If this does progress it could potentially be treated with an endovascular stent graft.  At 85 years old she is not a candidate for open surgical treatment.  Plan:  She will return to see me in 6 months with a CTA of the chest.  I spent 20 minutes performing this established patient evaluation and > 50% of this time was spent face to face counseling and coordinating the care of this patient's aortic aneurysm.    Gaye Pollack, MD [facility] [phone]

## 2019-10-19 LAB — CULTURE, URINE COMPREHENSIVE

## 2019-10-23 ENCOUNTER — Telehealth: Payer: Self-pay

## 2019-10-23 DIAGNOSIS — M545 Low back pain, unspecified: Secondary | ICD-10-CM

## 2019-10-23 NOTE — Telephone Encounter (Signed)
OK LS spine x-ray 2 views.  Diagnosis low back pain.  Thanks

## 2019-10-23 NOTE — Telephone Encounter (Signed)
New message   Seen 6.15.21  Patient C/o back pain, offer an appt with NP patient voiced only wants to see MD.   Asking for x-ray due to a lot of pain

## 2019-10-24 ENCOUNTER — Ambulatory Visit (INDEPENDENT_AMBULATORY_CARE_PROVIDER_SITE_OTHER): Payer: PPO

## 2019-10-24 DIAGNOSIS — M545 Low back pain, unspecified: Secondary | ICD-10-CM

## 2019-10-24 NOTE — Telephone Encounter (Signed)
Left pt a detailed message informing her of her X-ray's being ordered

## 2019-10-25 ENCOUNTER — Telehealth: Payer: Self-pay

## 2019-10-25 NOTE — Telephone Encounter (Signed)
E coli in urine cx was sensitive to Bactrim she took. No need to be doing anything else. Thx

## 2019-10-25 NOTE — Telephone Encounter (Signed)
Left detailed message informing pt of below.  What about her L-spine xrays? Can you see those?

## 2019-10-25 NOTE — Telephone Encounter (Signed)
I do not see the report yet.  Thanks

## 2019-10-25 NOTE — Telephone Encounter (Signed)
New message    Calling for test results and next steps

## 2019-10-29 ENCOUNTER — Encounter: Payer: Self-pay | Admitting: Internal Medicine

## 2019-10-29 DIAGNOSIS — N390 Urinary tract infection, site not specified: Secondary | ICD-10-CM | POA: Insufficient documentation

## 2019-10-29 NOTE — Assessment & Plan Note (Signed)
Recurrent Rx Diflucan x 10 d if re-occurs on Bactrim

## 2019-10-29 NOTE — Assessment & Plan Note (Signed)
Reg f/u w/Vascular surgery

## 2019-10-29 NOTE — Assessment & Plan Note (Signed)
Bactrim DS UA Cx

## 2019-10-29 NOTE — Assessment & Plan Note (Signed)
Rx - hyoscyamine

## 2019-10-30 NOTE — Telephone Encounter (Signed)
See 10/28/19 x ray results.

## 2019-11-24 ENCOUNTER — Other Ambulatory Visit: Payer: Self-pay | Admitting: Internal Medicine

## 2020-01-17 NOTE — Progress Notes (Signed)
Office Visit Note  Patient: Diane Abbott             Date of Birth: 03/06/1935           MRN: [id number]             PCP: Cassandria Anger, MD Referring: Cassandria Anger, MD Visit Date: 01/30/2020 Occupation: @GUAROCC @  Subjective:  Joint pain and joint swelling   History of Present Illness: Diane Abbott is a 85 y.o. female with history of rheumatoid arthritis, osteoarthritis and osteoporosis.  She states she has been off Enbrel since December 2020 due to insurance coverage.  Enbrel was denied by the patient assistance program.  She states she continues to have pain and discomfort in her joints.  She has tried multiple medications in the past and had side effects from most of the medications.  She has difficulty doing routine activities due to discomfort.  Activities of Daily Living:  Patient reports morning stiffness for 3 minutes.   Patient Denies nocturnal pain.  Difficulty dressing/grooming: Denies Difficulty climbing stairs: Reports Difficulty getting out of chair: Reports Difficulty using hands for taps, buttons, cutlery, and/or writing: Reports  Review of Systems  Constitutional: Positive for fatigue. Negative for night sweats, weight gain and weight loss.  HENT: Negative for mouth sores, trouble swallowing, trouble swallowing, mouth dryness and nose dryness.   Eyes: Negative for pain, redness, visual disturbance and dryness.  Respiratory: Negative for cough, shortness of breath and difficulty breathing.   Cardiovascular: Positive for swelling in legs/feet. Negative for chest pain, palpitations, hypertension and irregular heartbeat.  Gastrointestinal: Positive for constipation and diarrhea. Negative for blood in stool.  Endocrine: Positive for cold intolerance. Negative for heat intolerance and increased urination.  Genitourinary: Negative for difficulty urinating and vaginal dryness.  Musculoskeletal: Positive for arthralgias, gait problem, joint pain, joint  swelling and morning stiffness. Negative for myalgias, muscle weakness, muscle tenderness and myalgias.  Skin: Negative for color change, rash, hair loss, skin tightness, ulcers and sensitivity to sunlight.  Allergic/Immunologic: Negative for susceptible to infections.  Neurological: Positive for numbness. Negative for dizziness, memory loss, night sweats and weakness.  Hematological: Positive for bruising/bleeding tendency. Negative for swollen glands.  Psychiatric/Behavioral: Negative for depressed mood and sleep disturbance. The patient is not nervous/anxious.     PMFS History:  Patient Active Problem List   Diagnosis Date Noted  . HTN (hypertension) 01/22/2020  . Statin myopathy 01/22/2020  . Urinary tract infection without hematuria 10/29/2019  . Hair loss 04/11/2019  . Forehead laceration 04/11/2019  . Urinary urgency 04/11/2019  . Contusion of face 03/15/2019  . Hand laceration 10/20/2018  . Abdominal pain 07/15/2018  . History of left shoulder replacement 11/13/2016  . History of total knee replacement, bilateral 11/13/2016  . History of myeloproliferative disorder 09/17/2016  . IBS (irritable bowel syndrome) 09/17/2016  . Diskitis 08/29/2015  . Acute osteomyelitis of lumbar spine ([place]) 07/29/2015  . Thoracic compression fracture ([place]) 07/29/2015  . Discitis of lumbosacral region   . Epidural abscess   . Chronic renal insufficiency, stage III (moderate) 02/28/2015  . Hammer toe, acquired 11/29/2014  . Knee pain, left 07/25/2013  . S/P ascending aortic replacement 10/04/2012  . S/P AVR (aortic valve replacement) 10/04/2012  . Aortic aneurysm, thoracic ([place]) 09/14/2012  . Nausea 09/09/2011  . COPD (chronic obstructive pulmonary disease) ([place]) 03/12/2011  . RESTLESS LEG SYNDROME 02/03/2010  . Thrush of mouth and esophagus ([place]) 07/04/2009  . History of pulmonary embolism 09/14/2008  .  BRONCHITIS, ACUTE 06/06/2008  . Chest pain 07/13/2007  . GERD 07/08/2007  . Irritable  bowel syndrome 07/08/2007  . Chronic pain 07/08/2007  . OSTEOARTHRITIS 03/10/2007  . Osteopenia 03/10/2007  . PULMONARY EMBOLISM, HX OF 03/10/2007  . Rheumatoid arthritis ([place]) 11/23/2006    Past Medical History:  Diagnosis Date  . Adrenal insufficiency ([place])   . Anemia   . Anginal pain [facility])    "comes and goes has occured since 2012"  . Blood dyscrasia    "problems with platelets, red cells and white cells being low"  . Bronchitis Jan 2013-March 2013   severe  . Cancer ([place])    skin cancer on face  . Cataracts, bilateral    more in left than right  . COPD (chronic obstructive pulmonary disease) ([place])   . Depression   . Esophagitis   . Fatty liver   . Fibromyalgia   . Fibromyalgia   . Foot pain, left 08/26/2016   2/18 1st MTP - gout vs other  . GERD (gastroesophageal reflux disease)   . Heart murmur   . History of blood clots 2008   below knee post trauma. Dilated [facility] no clotting disorder felt to be present.  . History of echocardiogram    Echo 11/16: Mild LVH, EF 60-65%, normal wall motion, grade 1 diastolic dysfunction, bioprosthetic AVR with mean gradient 21 mmHg and no regurgitation, proximal aortic arch aneurysm 49 mm, moderate TR, PASP 31 mmHg  . Hypertension   . IBS (irritable bowel syndrome)   . Leaky heart valve   . Osteoarthritis   . Osteoarthritis of left shoulder 12/07/2011  . Osteopenia   . Pulmonary embolism ([place])   . RA (rheumatoid arthritis) ([place])   . Shortness of breath    attributed to aneurysm  . Sliding hiatal hernia   . Urinary leakage    when coughing  . Vertigo    hx of  . Vitamin B 12 deficiency   . Vitamin D deficiency     Family History  Problem Relation Age of Onset  . Ovarian cancer Mother   . Diabetes Mother   . COPD Father   . Heart disease Maternal Grandmother   . Clotting disorder Other        Father, brother, sister, and daughter all had deep venous thrombosis  . Breast cancer Other   . Pancreatic cancer Paternal  Uncle   . Colon cancer Neg Hx    Past Surgical History:  Procedure Laterality Date  . ABDOMINAL HYSTERECTOMY    . AORTA SURGERY  12/2012   Resection of aneurysm and valve replacement  . APPENDECTOMY  1956  . BENTALL PROCEDURE N/A 09/29/2012   Procedure: BENTALL PROCEDURE;  Surgeon: Gaye Pollack, MD;  Location: [place];  Service: Open Heart Surgery;  Laterality: N/A;  WITH CIRC ARREST  . BILATERAL OOPHORECTOMY    . BREAST BIOPSY Left   . CARDIAC CATHETERIZATION  09/15/12   no PCI  . CATARACT EXTRACTION Bilateral   . CHOLECYSTECTOMY  2003  . COLONOSCOPY    . COSMETIC SURGERY     Tummy tuck  . INTRAOPERATIVE TRANSESOPHAGEAL ECHOCARDIOGRAM N/A 09/29/2012   Procedure: INTRAOPERATIVE TRANSESOPHAGEAL ECHOCARDIOGRAM;  Surgeon: Gaye Pollack, MD;  Location[facility];  Service: Open Heart Surgery;  Laterality: N/A;  . JOINT REPLACEMENT Bilateral   . REPLACEMENT ASCENDING AORTA N/A 06/29/2016   Procedure: AORTIC ARCH REPLACEMENT WITH CIRC ARREST (N/A) - RIGHT AXILLARY CANNULATION  AORTO-AORTIC 28 HEMASHIELD GRAFT WITH AORTO-INNOMINATE AND  AORTO-LEFT CAROTID ANASTAMOSIS;  Surgeon: Gaye Pollack, MD;  Location: [facility] OR;  Service: Open Heart Surgery;  Laterality: N/A;  RIGHT AXILLARY CANNULATION  BILATERAL RADIAL A-LINE  . SKIN CANCER EXCISION Right    cheek  . TEE WITHOUT CARDIOVERSION N/A 06/29/2016   Procedure: TRANSESOPHAGEAL ECHOCARDIOGRAM (TEE);  Surgeon: Gaye Pollack, MD;  Location: [place];  Service: Open Heart Surgery;  Laterality: N/A;  . TONSILLECTOMY  1941  . TOTAL KNEE ARTHROPLASTY     bilateral  . TOTAL SHOULDER ARTHROPLASTY  12/07/2011   Procedure: TOTAL SHOULDER ARTHROPLASTY;  Surgeon: Johnny Bridge, MD;  Location: [place];  Service: Orthopedics;  Laterality: Left;  left total shoulder artthroplasty  . WRIST SURGERY     bilateral   Social History   Social History Narrative  . Not on file   Immunization History  Administered Date(s) Administered  . Influenza Whole 03/10/2007,  02/01/2008  . PFIZER SARS-COV-2 Vaccination 08/07/2019, 08/30/2019  . Pneumococcal Conjugate-13 03/23/2013  . Pneumococcal Polysaccharide-23 02/03/2010, 01/22/2020  . Td 03/21/2012  . Tdap 10/17/2018  . Zoster 11/04/2010     Objective: Vital Signs: BP (!) 153/73 (BP Location: Left Arm, Patient Position: Sitting, Cuff Size: Normal)   Pulse 87   Resp 18   Ht 5' (1.524 m)   Wt 145 lb (65.8 kg)   BMI 28.32 kg/m    Physical Exam Vitals and nursing note reviewed.  Constitutional:      Appearance: She is well-developed.  HENT:     Head: Normocephalic and atraumatic.  Eyes:     Conjunctiva/sclera: Conjunctivae normal.  Cardiovascular:     Rate and Rhythm: Normal rate and regular rhythm.     Heart sounds: Normal heart sounds.  Pulmonary:     Effort: Pulmonary effort is normal.     Breath sounds: Normal breath sounds.  Abdominal:     General: Bowel sounds are normal.     Palpations: Abdomen is soft.  Musculoskeletal:     Cervical back: Normal range of motion.  Lymphadenopathy:     Cervical: No cervical adenopathy.  Skin:    General: Skin is warm and dry.     Capillary Refill: Capillary refill takes less than 2 seconds.  Neurological:     Mental Status: She is alert and oriented to person, place, and time.  Psychiatric:        Behavior: Behavior normal.      Musculoskeletal Exam: She had good range of motion of her cervical spine.  Shoulder joints and elbow joints with good range of motion.  She had mild synovitis in her right wrist joint.  She has some synovitis in her MCPs as described below.  Hip joints were difficult to assess that she could not get on the exam table.  Knee joints with good range of motion with no synovitis.  She had no tenderness over her ankles or MTPs. CDAI Exam: CDAI Score: 6.9  Patient Global: 5 mm; Provider Global: 4 mm Swollen: 3 ; Tender: 3  Joint Exam 01/30/2020      Right  Left  Wrist  Swollen Tender     MCP 2  Swollen Tender  Swollen  Tender     Investigation: No additional findings.  Imaging: No results found.  Recent Labs: Lab Results  Component Value Date   WBC 3.3 (L) 08/09/2019   HGB 12.5 08/09/2019   PLT 135 (L) 08/09/2019   NA 141 01/22/2020   K 5.5 (H) 01/22/2020   CL 104  01/22/2020   CO2 27 01/22/2020   GLUCOSE 108 (H) 01/22/2020   BUN 24 01/22/2020   CREATININE 1.26 (H) 01/22/2020   BILITOT 0.5 01/22/2020   ALKPHOS 69 06/02/2019   AST 16 01/22/2020   ALT 7 01/22/2020   PROT 7.5 01/22/2020   ALBUMIN 4.1 06/02/2019   CALCIUM 9.9 01/22/2020   GFRAA 46 (L) 01/22/2020   QFTBGOLD TNP 03/12/2015   QFTBGOLDPLUS NEGATIVE 11/24/2018    Speciality Comments: Piror therapy included: Simponi, Arava, Cimzia and Humira (inadequate response), methotrexate and Remicade (intolerance)  Procedures:  No procedures performed Allergies: Acetaminophen, Codeine, Pramipexole dihydrochloride, Rofecoxib, Venlafaxine, Arava [leflunomide], Clonazepam, Diclofenac sodium, Sulfa antibiotics, Alprazolam, Darvocet [propoxyphene n-acetaminophen], Duloxetine, Gabapentin, Latex, Morphine and related, Nortriptyline hcl, Penicillins, and Prednisone   Assessment / Plan:     Visit Diagnoses: Rheumatoid arthritis of multiple sites with negative rheumatoid factor ([place]) - Severe erosive disease. Previous tx: Carita Pian was held in January 2017 due to discitis, inadequate response-Cimzia, Humira, intolerance MTX, leflunomide & Remicade): She had been taking Enbrel every 14 days which was working quite well for her.  She continues to have neutropenia and thrombocytopenia despite stopping the medication.  She wants to go back on Enbrel.  Which is not covered by her insurance.  Due to history of DVT we did not want to use any JAK-1 millimeters.  Indications side effects of different medications were discussed which she declined.  I have been offered low-dose prednisone which she declined.  I offered referral to the rheumatologist which she  declined.  Patient stated that she will contact enroll herself and if approved she will contact us for an earlier appointment otherwise she will come back in 1 year.  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  High risk medication use -( Enbrel 50 mg sq injections every 14 days).  She had been off Enbrel since December 2020 due to insurance issues.  Thrombocytopenia due to drugs-she has had chronic thrombocytopenia.  Despite stopping Enbrel, thrombocytopenia persists.  Fibromyalgia-she continues to have some generalized pain and discomfort.  History of left shoulder replacement - Dr. Mardelle Matte:  History of total knee replacement, bilateral-she has some difficulty walking.  She uses a cane.  Other osteoporosis without current pathological fracture - She discontinued Forteo due to it causing dizziness and frequent falls.  I detailed discussion regarding different treatment options but she declined.  History of vitamin D deficiency  History of chronic kidney disease-creatinine has been elevated.  History of vertebral fracture  S/P AVR (aortic valve replacement)  History of gastroesophageal reflux (GERD)  History of COPD  History of myeloproliferative disorder  H/O aortic arch replacement  Moderate episode of recurrent major depressive disorder (HCC)  History of DVT (deep vein thrombosis)  History of IBS  Educated about COVID-19 virus infection-she is fully vaccinated against COVID-19.  Use of mask, social distancing and hand hygiene was discussed.  Booster was recommended.  I also discussed the option of monoclonal antibody infusion in case she develops COVID-19 infection.  Orders: No orders of the defined types were placed in this encounter.  No orders of the defined types were placed in this encounter.     Follow-Up Instructions: Return in about 1 year (around 01/29/2021), or if symptoms worsen or fail to improve, for Rheumatoid arthritis,  Osteoarthritis.   Bo Merino, MD  Note - This record has been created using Editor, commissioning.  Chart creation errors have been sought, but may not always  have been located. Such creation errors do  not reflect on  the standard of medical care.

## 2020-01-22 ENCOUNTER — Ambulatory Visit (INDEPENDENT_AMBULATORY_CARE_PROVIDER_SITE_OTHER): Payer: PPO | Admitting: Internal Medicine

## 2020-01-22 ENCOUNTER — Other Ambulatory Visit: Payer: Self-pay

## 2020-01-22 ENCOUNTER — Encounter: Payer: Self-pay | Admitting: Internal Medicine

## 2020-01-22 VITALS — BP 200/42 | HR 82 | Temp 98.2°F | Ht 60.0 in | Wt 142.0 lb

## 2020-01-22 DIAGNOSIS — G894 Chronic pain syndrome: Secondary | ICD-10-CM

## 2020-01-22 DIAGNOSIS — N1831 Chronic kidney disease, stage 3a: Secondary | ICD-10-CM

## 2020-01-22 DIAGNOSIS — Z23 Encounter for immunization: Secondary | ICD-10-CM | POA: Diagnosis not present

## 2020-01-22 DIAGNOSIS — I10 Essential (primary) hypertension: Secondary | ICD-10-CM | POA: Diagnosis not present

## 2020-01-22 DIAGNOSIS — T466X5A Adverse effect of antihyperlipidemic and antiarteriosclerotic drugs, initial encounter: Secondary | ICD-10-CM

## 2020-01-22 DIAGNOSIS — G720 Drug-induced myopathy: Secondary | ICD-10-CM | POA: Diagnosis not present

## 2020-01-22 DIAGNOSIS — I712 Thoracic aortic aneurysm, without rupture, unspecified: Secondary | ICD-10-CM

## 2020-01-22 LAB — COMPLETE METABOLIC PANEL WITH GFR
AG Ratio: 1.2 (calc) (ref 1.0–2.5)
ALT: 7 U/L (ref 6–29)
AST: 16 U/L (ref 10–35)
Albumin: 4.1 g/dL (ref 3.6–5.1)
Alkaline phosphatase (APISO): 66 U/L (ref 37–153)
BUN/Creatinine Ratio: 19 (calc) (ref 6–22)
BUN: 24 mg/dL (ref 7–25)
CO2: 27 mmol/L (ref 20–32)
Calcium: 9.9 mg/dL (ref 8.6–10.4)
Chloride: 104 mmol/L (ref 98–110)
Creat: 1.26 mg/dL — ABNORMAL HIGH (ref 0.60–0.88)
GFR, Est African American: 46 mL/min/{1.73_m2} — ABNORMAL LOW (ref >=60)
GFR, Est Non African American: 39 mL/min/{1.73_m2} — ABNORMAL LOW (ref >=60)
Globulin: 3.4 g/dL (calc) (ref 1.9–3.7)
Glucose, Bld: 108 mg/dL — ABNORMAL HIGH (ref 65–99)
Potassium: 5.5 mmol/L — ABNORMAL HIGH (ref 3.5–5.3)
Sodium: 141 mmol/L (ref 135–146)
Total Bilirubin: 0.5 mg/dL (ref 0.2–1.2)
Total Protein: 7.5 g/dL (ref 6.1–8.1)

## 2020-01-22 MED ORDER — AMLODIPINE BESYLATE 2.5 MG PO TABS: 2.5000 mg | ORAL_TABLET | Freq: Every day | ORAL | 3 refills | Status: DC

## 2020-01-22 NOTE — Assessment & Plan Note (Signed)
Treat HTN

## 2020-01-22 NOTE — Assessment & Plan Note (Signed)
Off Oxy

## 2020-01-22 NOTE — Assessment & Plan Note (Signed)
Not on statins 

## 2020-01-22 NOTE — Assessment & Plan Note (Signed)
Labs

## 2020-01-22 NOTE — Assessment & Plan Note (Addendum)
Elevated BP: start Norvasc BP at home 125/85, sometimes higher Cut back on salt

## 2020-01-22 NOTE — Addendum Note (Signed)
Addended by: Cresenciano Lick on: 01/22/2020 08:45 AM   Modules accepted: Orders

## 2020-01-22 NOTE — Addendum Note (Signed)
Addended by: Darlys Gales on: 01/22/2020 01:56 PM   Modules accepted: Orders

## 2020-01-22 NOTE — Progress Notes (Signed)
Subjective:  Patient ID: Diane Abbott, female    DOB: 1934-11-05  Age: 85 y.o. MRN: [id number]  CC: No chief complaint on file.   HPI ARIELA MOCHIZUKI presents for insomnia, pains, severe pains/OA C/o constipation w/sulfa BP at home 125/85, sometimes higher  Outpatient Medications Prior to Visit  Medication Sig Dispense Refill  . aspirin 500 MG EC tablet Take 500 mg by mouth daily.    . fluticasone furoate-vilanterol (BREO ELLIPTA) 100-25 MCG/INH AEPB INHALE 1 PUFF BY MOUTH ONCE DAILY 60 each 11  . Fluticasone-Umeclidin-Vilant (TRELEGY ELLIPTA) 100-62.5-25 MCG/INH AEPB 1 inh daily 1 each 11  . rOPINIRole (REQUIP) 4 MG tablet TAKE 1 TABLET BY MOUTH 3 TIMES A DAY 270 tablet 3  . Tiotropium Bromide Monohydrate (SPIRIVA RESPIMAT) 1.25 MCG/ACT AERS Inhale 2 sprays into the lungs daily. 4 g 0  . Cholecalciferol (VITAMIN D3) 50 MCG (2000 UT) capsule Take 1 capsule (2,000 Units total) by mouth daily. (Patient not taking: Reported on 01/22/2020) 100 capsule 3  . fluconazole (DIFLUCAN) 100 MG tablet Take 2 tablets (200 mg total) by mouth daily. Take 2 tabs on day#1, then 1 tab daily on Days #2-10 (Patient not taking: Reported on 01/22/2020) 14 tablet 1  . hyoscyamine (LEVSIN) 0.125 MG tablet Take 1 tablet (0.125 mg total) by mouth every 4 (four) hours as needed for up to 10 days. 100 tablet 3  . lidocaine (LIDODERM) 5 % Place 1-2 patches onto the skin daily. Remove & Discard patch within 12 hours or as directed by MD (Patient not taking: Reported on 01/22/2020) 60 patch 11  . loratadine (CLARITIN) 10 MG tablet Take 1 tablet (10 mg total) by mouth daily. (Patient not taking: Reported on 01/22/2020) 30 tablet 11  . sulfamethoxazole-trimethoprim (BACTRIM DS) 800-160 MG tablet Take 1 tablet by mouth 2 (two) times daily. (Patient not taking: Reported on 01/22/2020) 14 tablet 1  . torsemide (DEMADEX) 20 MG tablet Take 40 mg by mouth 2 (two) times daily as needed (Swelling).  (Patient not taking: Reported on  01/22/2020)     No facility-administered medications prior to visit.    ROS: Review of Systems  Constitutional: Negative for activity change, appetite change, chills, fatigue and unexpected weight change.  HENT: Negative for congestion, mouth sores and sinus pressure.   Eyes: Negative for visual disturbance.  Respiratory: Negative for cough and chest tightness.   Gastrointestinal: Negative for abdominal pain and nausea.  Genitourinary: Negative for difficulty urinating, frequency and vaginal pain.  Musculoskeletal: Positive for arthralgias, back pain and gait problem.  Skin: Negative for pallor and rash.  Neurological: Negative for dizziness, tremors, weakness, numbness and headaches.  Psychiatric/Behavioral: Positive for sleep disturbance. Negative for confusion. The patient is nervous/anxious.     Objective:  BP (!) 200/42 (BP Location: Right Arm, Patient Position: Sitting, Cuff Size: Normal)   Pulse 82   Temp 98.2 F (36.8 C) (Oral)   Ht 5' (1.524 m)   Wt 142 lb (64.4 kg)   SpO2 98%   BMI 27.73 kg/m   BP Readings from Last 3 Encounters:  01/22/20 (!) 200/42  10/18/19 (!) 160/82  10/17/19 (!) 160/40    Wt Readings from Last 3 Encounters:  01/22/20 142 lb (64.4 kg)  10/18/19 157 lb (71.2 kg)  10/17/19 157 lb (71.2 kg)    Physical Exam Constitutional:      General: She is not in acute distress.    Appearance: She is well-developed.  HENT:     Head: Normocephalic.  Right Ear: External ear normal.     Left Ear: External ear normal.     Nose: Nose normal.  Eyes:     General:        Right eye: No discharge.        Left eye: No discharge.     Conjunctiva/sclera: Conjunctivae normal.     Pupils: Pupils are equal, round, and reactive to light.  Neck:     Thyroid: No thyromegaly.     Vascular: No JVD.     Trachea: No tracheal deviation.  Cardiovascular:     Rate and Rhythm: Normal rate and regular rhythm.     Heart sounds: Normal heart sounds.  Pulmonary:      Effort: No respiratory distress.     Breath sounds: No stridor. No wheezing.  Abdominal:     General: Bowel sounds are normal. There is no distension.     Palpations: Abdomen is soft. There is no mass.     Tenderness: There is no abdominal tenderness. There is no guarding or rebound.  Musculoskeletal:        General: Tenderness present.     Cervical back: Normal range of motion and neck supple.  Lymphadenopathy:     Cervical: No cervical adenopathy.  Skin:    Findings: No erythema or rash.  Neurological:     Mental Status: She is oriented to person, place, and time.     Cranial Nerves: No cranial nerve deficit.     Motor: Weakness present. No abnormal muscle tone.     Coordination: Coordination abnormal.     Gait: Gait abnormal.     Deep Tendon Reflexes: Reflexes normal.  Psychiatric:        Behavior: Behavior normal.        Thought Content: Thought content normal.        Judgment: Judgment normal.   walker LS and other joints w/pain  Lab Results  Component Value Date   WBC 3.3 (L) 08/09/2019   HGB 12.5 08/09/2019   HCT 37.4 08/09/2019   PLT 135 (L) 08/09/2019   GLUCOSE 98 08/09/2019   ALT 10 08/09/2019   AST 18 08/09/2019   NA 140 08/09/2019   K 5.0 08/09/2019   CL 105 08/09/2019   CREATININE 1.29 (H) 08/09/2019   BUN 24 08/09/2019   CO2 26 08/09/2019   TSH 3.95 04/08/2018   INR 1.48 06/29/2016   HGBA1C 5.5 06/24/2016    CT ANGIO CHEST AORTA W/CM & OR WO/CM  Result Date: 10/18/2019 CLINICAL DATA:  Thoracic aortic aneurysm, status post surgical repair, CT surveillance EXAM: CT ANGIOGRAPHY CHEST WITH CONTRAST TECHNIQUE: Multidetector CT imaging of the chest was performed using the standard protocol during bolus administration of intravenous contrast. Multiplanar CT image reconstructions and MIPs were obtained to evaluate the vascular anatomy. CONTRAST:  10mL ISOVUE-370 IOPAMIDOL (ISOVUE-370) INJECTION 76% COMPARISON:  10/05/2018 FINDINGS: Cardiovascular: Patient is  status post aortic valve replacement and reconstruction of the ascending aorta and transverse aorta. Stable postoperative changes with wide patency of the reconstruction and the reimplanted major branch vessels. Stable outpouching of the reconstructed aortic arch measuring 10 x 5 mm and compatible with a surgical cannulation site. Descending thoracic aorta has similar aneurysmal dilatation proximally measuring 4.1 cm. However, the mid descending thoracic aorta has increased aneurysmal dilatation measuring 4.1 cm, previously 3.4 cm. At this level, there is wall thickening and broad-based left anterolateral aortic wall outpouching, beginning on image 59 through image 64 series 6. This is also  present on the coronal reconstructions, image 115 series 11. Appearance is concerning for a broad-base penetrating atherosclerotic ulcer with increased aneurysmal dilatation of the mid descending thoracic aorta. No associated acute dissection, mediastinal hemorrhage, or hematoma. At the diaphragmatic hiatus, aorta remains elongated and tortuous but stable in diameter at 2.6 cm. Normal heart size.  No pericardial effusion. Mediastinum/Nodes: Stable mild pretracheal adenopathy measuring 2.2 cm in short axis. No new bulky adenopathy. Thyroid, trachea and esophagus unremarkable. Lungs/Pleura: Minor basilar atelectasis. No acute airspace process, collapse or consolidation. Negative for edema, pneumothorax. Interstitial process. Upper Abdomen: Remote cholecystectomy. No acute upper abdominal finding. Musculoskeletal: Degenerative changes spine. Remote left shoulder arthroplasty changes creating artifact. Review of the MIP images confirms the above findings. IMPRESSION: Stable surgical repair of the aortic valve, ascending thoracic aorta and transverse aorta. New left anterolateral wall irregularity of the mid descending thoracic aorta having a broad-based appearance on coronal imaging with associated increased aneurysmal dilatation now  measuring 4.1 cm, previously 3.4 cm. Appearance is concerning for a broad-based penetrating atherosclerotic ulcer of the mid descending thoracic aorta. No current evidence of acute dissection, mediastinal hemorrhage or hematoma. No other acute intrathoracic finding. Aortic Atherosclerosis (ICD10-I70.0). These results will be called to the ordering clinician or representative by the Radiologist Assistant, and communication documented in the PACS or [employer]. Electronically Signed   By: Jerilynn Mages.  Shick M.D.   On: 10/18/2019 15:29    Assessment & Plan:    Walker Kehr, MD

## 2020-01-30 ENCOUNTER — Other Ambulatory Visit: Payer: Self-pay

## 2020-01-30 ENCOUNTER — Encounter: Payer: Self-pay | Admitting: Rheumatology

## 2020-01-30 ENCOUNTER — Ambulatory Visit: Payer: PPO | Admitting: Rheumatology

## 2020-01-30 VITALS — BP 153/73 | HR 87 | Resp 18 | Ht 60.0 in | Wt 145.0 lb

## 2020-01-30 DIAGNOSIS — Z7189 Other specified counseling: Secondary | ICD-10-CM

## 2020-01-30 DIAGNOSIS — D6959 Other secondary thrombocytopenia: Secondary | ICD-10-CM

## 2020-01-30 DIAGNOSIS — M797 Fibromyalgia: Secondary | ICD-10-CM | POA: Diagnosis not present

## 2020-01-30 DIAGNOSIS — Z8781 Personal history of (healed) traumatic fracture: Secondary | ICD-10-CM | POA: Diagnosis not present

## 2020-01-30 DIAGNOSIS — Z96653 Presence of artificial knee joint, bilateral: Secondary | ICD-10-CM | POA: Diagnosis not present

## 2020-01-30 DIAGNOSIS — F331 Major depressive disorder, recurrent, moderate: Secondary | ICD-10-CM

## 2020-01-30 DIAGNOSIS — M0630 Rheumatoid nodule, unspecified site: Secondary | ICD-10-CM

## 2020-01-30 DIAGNOSIS — M0609 Rheumatoid arthritis without rheumatoid factor, multiple sites: Secondary | ICD-10-CM | POA: Diagnosis not present

## 2020-01-30 DIAGNOSIS — Z8639 Personal history of other endocrine, nutritional and metabolic disease: Secondary | ICD-10-CM | POA: Diagnosis not present

## 2020-01-30 DIAGNOSIS — Z8709 Personal history of other diseases of the respiratory system: Secondary | ICD-10-CM

## 2020-01-30 DIAGNOSIS — Z862 Personal history of diseases of the blood and blood-forming organs and certain disorders involving the immune mechanism: Secondary | ICD-10-CM

## 2020-01-30 DIAGNOSIS — Z8719 Personal history of other diseases of the digestive system: Secondary | ICD-10-CM

## 2020-01-30 DIAGNOSIS — Z87448 Personal history of other diseases of urinary system: Secondary | ICD-10-CM

## 2020-01-30 DIAGNOSIS — Z79899 Other long term (current) drug therapy: Secondary | ICD-10-CM | POA: Diagnosis not present

## 2020-01-30 DIAGNOSIS — Z86718 Personal history of other venous thrombosis and embolism: Secondary | ICD-10-CM

## 2020-01-30 DIAGNOSIS — Z96612 Presence of left artificial shoulder joint: Secondary | ICD-10-CM

## 2020-01-30 DIAGNOSIS — M818 Other osteoporosis without current pathological fracture: Secondary | ICD-10-CM | POA: Diagnosis not present

## 2020-01-30 DIAGNOSIS — Z95828 Presence of other vascular implants and grafts: Secondary | ICD-10-CM

## 2020-01-30 DIAGNOSIS — T50905A Adverse effect of unspecified drugs, medicaments and biological substances, initial encounter: Secondary | ICD-10-CM

## 2020-01-30 DIAGNOSIS — Z952 Presence of prosthetic heart valve: Secondary | ICD-10-CM

## 2020-02-26 ENCOUNTER — Other Ambulatory Visit: Payer: Self-pay

## 2020-02-26 ENCOUNTER — Ambulatory Visit (INDEPENDENT_AMBULATORY_CARE_PROVIDER_SITE_OTHER): Payer: PPO | Admitting: Internal Medicine

## 2020-02-26 ENCOUNTER — Encounter: Payer: Self-pay | Admitting: Internal Medicine

## 2020-02-26 DIAGNOSIS — I712 Thoracic aortic aneurysm, without rupture, unspecified: Secondary | ICD-10-CM

## 2020-02-26 DIAGNOSIS — M0609 Rheumatoid arthritis without rheumatoid factor, multiple sites: Secondary | ICD-10-CM | POA: Diagnosis not present

## 2020-02-26 DIAGNOSIS — I10 Essential (primary) hypertension: Secondary | ICD-10-CM

## 2020-02-26 DIAGNOSIS — G894 Chronic pain syndrome: Secondary | ICD-10-CM | POA: Diagnosis not present

## 2020-02-26 DIAGNOSIS — M4646 Discitis, unspecified, lumbar region: Secondary | ICD-10-CM

## 2020-02-26 NOTE — Assessment & Plan Note (Signed)
Norvasc for BP control

## 2020-02-26 NOTE — Patient Instructions (Signed)
[place] vaccine booster sign up: please call [place]ine Line at [phone]. You can also call the venue where you had your initial COVID 19 vaccination.

## 2020-02-26 NOTE — Assessment & Plan Note (Signed)
Norvasc

## 2020-02-26 NOTE — Assessment & Plan Note (Signed)
F/u Dr Patrecia Pour Monika Salk due to $$$; off [employer]

## 2020-02-26 NOTE — Progress Notes (Signed)
Subjective:  Patient ID: Diane Abbott, female    DOB: 10-24-1934  Age: 85 y.o. MRN: [id number]  CC: Follow-up   HPI RUE VALLADARES presents for HTN, RA, h/o diskitis  Outpatient Medications Prior to Visit  Medication Sig Dispense Refill  . amLODipine (NORVASC) 2.5 MG tablet Take 1 tablet (2.5 mg total) by mouth daily. 90 tablet 3  . aspirin 500 MG EC tablet Take 500 mg by mouth daily.    . Cholecalciferol (VITAMIN D3) 50 MCG (2000 UT) capsule Take 1 capsule (2,000 Units total) by mouth daily. 100 capsule 3  . fluconazole (DIFLUCAN) 100 MG tablet Take 2 tablets (200 mg total) by mouth daily. Take 2 tabs on day#1, then 1 tab daily on Days #2-10 14 tablet 1  . Fluticasone-Umeclidin-Vilant (TRELEGY ELLIPTA) 100-62.5-25 MCG/INH AEPB 1 inh daily 1 each 11  . lidocaine (LIDODERM) 5 % Place 1-2 patches onto the skin daily. Remove & Discard patch within 12 hours or as directed by MD 60 patch 11  . rOPINIRole (REQUIP) 4 MG tablet TAKE 1 TABLET BY MOUTH 3 TIMES A DAY 270 tablet 3  . torsemide (DEMADEX) 20 MG tablet Take 40 mg by mouth 2 (two) times daily as needed (Swelling).     . hyoscyamine (LEVSIN) 0.125 MG tablet Take 1 tablet (0.125 mg total) by mouth every 4 (four) hours as needed for up to 10 days. 100 tablet 3   No facility-administered medications prior to visit.    ROS: Review of Systems  Constitutional: Positive for fatigue. Negative for activity change, appetite change, chills and unexpected weight change.  HENT: Negative for congestion, mouth sores and sinus pressure.   Eyes: Negative for visual disturbance.  Respiratory: Negative for cough and chest tightness.   Gastrointestinal: Negative for abdominal pain and nausea.  Genitourinary: Negative for difficulty urinating, frequency and vaginal pain.  Musculoskeletal: Positive for arthralgias, back pain and gait problem.  Skin: Negative for pallor and rash.  Neurological: Positive for weakness. Negative for dizziness, tremors,  numbness and headaches.  Psychiatric/Behavioral: Negative for confusion and sleep disturbance.    Objective:  BP (!) 150/98 (BP Location: Right Arm, Patient Position: Sitting, Cuff Size: Small)   Pulse 90   Temp 98.6 F (37 C) (Temporal)   Ht 5' (1.524 m)   Wt 142 lb 9.6 oz (64.7 kg)   SpO2 97%   BMI 27.85 kg/m   BP Readings from Last 3 Encounters:  02/26/20 (!) 150/98  01/30/20 (!) 153/73  01/22/20 (!) 200/42    Wt Readings from Last 3 Encounters:  02/26/20 142 lb 9.6 oz (64.7 kg)  01/30/20 145 lb (65.8 kg)  01/22/20 142 lb (64.4 kg)    Physical Exam Constitutional:      General: She is not in acute distress.    Appearance: She is well-developed.  HENT:     Head: Normocephalic.     Right Ear: External ear normal.     Left Ear: External ear normal.     Nose: Nose normal.  Eyes:     General:        Right eye: No discharge.        Left eye: No discharge.     Conjunctiva/sclera: Conjunctivae normal.     Pupils: Pupils are equal, round, and reactive to light.  Neck:     Thyroid: No thyromegaly.     Vascular: No JVD.     Trachea: No tracheal deviation.  Cardiovascular:     Rate and  Rhythm: Normal rate and regular rhythm.     Heart sounds: Normal heart sounds.  Pulmonary:     Effort: No respiratory distress.     Breath sounds: No stridor. No wheezing.  Abdominal:     General: Bowel sounds are normal. There is no distension.     Palpations: Abdomen is soft. There is no mass.     Tenderness: There is no abdominal tenderness. There is no guarding or rebound.  Musculoskeletal:        General: Tenderness present.     Cervical back: Normal range of motion and neck supple.  Lymphadenopathy:     Cervical: No cervical adenopathy.  Skin:    Findings: No erythema or rash.  Neurological:     Mental Status: She is oriented to person, place, and time.     Cranial Nerves: No cranial nerve deficit.     Motor: No abnormal muscle tone.     Coordination: Coordination  abnormal.     Gait: Gait abnormal.     Deep Tendon Reflexes: Reflexes normal.  Psychiatric:        Behavior: Behavior normal.        Thought Content: Thought content normal.        Judgment: Judgment normal.    LS tender Lab Results  Component Value Date   WBC 3.3 (L) 08/09/2019   HGB 12.5 08/09/2019   HCT 37.4 08/09/2019   PLT 135 (L) 08/09/2019   GLUCOSE 108 (H) 01/22/2020   ALT 7 01/22/2020   AST 16 01/22/2020   NA 141 01/22/2020   K 5.5 (H) 01/22/2020   CL 104 01/22/2020   CREATININE 1.26 (H) 01/22/2020   BUN 24 01/22/2020   CO2 27 01/22/2020   TSH 3.95 04/08/2018   INR 1.48 06/29/2016   HGBA1C 5.5 06/24/2016    CT ANGIO CHEST AORTA W/CM & OR WO/CM  Result Date: 10/18/2019 CLINICAL DATA:  Thoracic aortic aneurysm, status post surgical repair, CT surveillance EXAM: CT ANGIOGRAPHY CHEST WITH CONTRAST TECHNIQUE: Multidetector CT imaging of the chest was performed using the standard protocol during bolus administration of intravenous contrast. Multiplanar CT image reconstructions and MIPs were obtained to evaluate the vascular anatomy. CONTRAST:  3mL ISOVUE-370 IOPAMIDOL (ISOVUE-370) INJECTION 76% COMPARISON:  10/05/2018 FINDINGS: Cardiovascular: Patient is status post aortic valve replacement and reconstruction of the ascending aorta and transverse aorta. Stable postoperative changes with wide patency of the reconstruction and the reimplanted major branch vessels. Stable outpouching of the reconstructed aortic arch measuring 10 x 5 mm and compatible with a surgical cannulation site. Descending thoracic aorta has similar aneurysmal dilatation proximally measuring 4.1 cm. However, the mid descending thoracic aorta has increased aneurysmal dilatation measuring 4.1 cm, previously 3.4 cm. At this level, there is wall thickening and broad-based left anterolateral aortic wall outpouching, beginning on image 59 through image 64 series 6. This is also present on the coronal reconstructions,  image 115 series 11. Appearance is concerning for a broad-base penetrating atherosclerotic ulcer with increased aneurysmal dilatation of the mid descending thoracic aorta. No associated acute dissection, mediastinal hemorrhage, or hematoma. At the diaphragmatic hiatus, aorta remains elongated and tortuous but stable in diameter at 2.6 cm. Normal heart size.  No pericardial effusion. Mediastinum/Nodes: Stable mild pretracheal adenopathy measuring 2.2 cm in short axis. No new bulky adenopathy. Thyroid, trachea and esophagus unremarkable. Lungs/Pleura: Minor basilar atelectasis. No acute airspace process, collapse or consolidation. Negative for edema, pneumothorax. Interstitial process. Upper Abdomen: Remote cholecystectomy. No acute upper abdominal finding. Musculoskeletal:  Degenerative changes spine. Remote left shoulder arthroplasty changes creating artifact. Review of the MIP images confirms the above findings. IMPRESSION: Stable surgical repair of the aortic valve, ascending thoracic aorta and transverse aorta. New left anterolateral wall irregularity of the mid descending thoracic aorta having a broad-based appearance on coronal imaging with associated increased aneurysmal dilatation now measuring 4.1 cm, previously 3.4 cm. Appearance is concerning for a broad-based penetrating atherosclerotic ulcer of the mid descending thoracic aorta. No current evidence of acute dissection, mediastinal hemorrhage or hematoma. No other acute intrathoracic finding. Aortic Atherosclerosis (ICD10-I70.0). These results will be called to the ordering clinician or representative by the Radiologist Assistant, and communication documented in the PACS or [employer]. Electronically Signed   By: Jerilynn Mages.  Shick M.D.   On: 10/18/2019 15:29    Assessment & Plan:   There are no diagnoses linked to this encounter.   No orders of the defined types were placed in this encounter.    Follow-up: No follow-ups on file.  Walker Kehr, MD

## 2020-02-26 NOTE — Assessment & Plan Note (Signed)
Unable to use RA Rx

## 2020-03-18 ENCOUNTER — Other Ambulatory Visit: Payer: Self-pay | Admitting: Surgery

## 2020-03-18 DIAGNOSIS — I712 Thoracic aortic aneurysm, without rupture, unspecified: Secondary | ICD-10-CM

## 2020-04-17 ENCOUNTER — Ambulatory Visit: Payer: PPO | Admitting: Surgery

## 2020-04-17 ENCOUNTER — Ambulatory Visit
Admission: RE | Admit: 2020-04-17 | Discharge: 2020-04-17 | Disposition: A | Discharge status: Home / Self Care | Payer: PPO | Source: Ambulatory Visit | Attending: Surgery | Admitting: Surgery

## 2020-04-17 ENCOUNTER — Other Ambulatory Visit: Payer: Self-pay

## 2020-04-17 ENCOUNTER — Encounter: Payer: Self-pay | Admitting: Surgery

## 2020-04-17 VITALS — BP 138/82 | HR 76 | Resp 20 | Ht 60.0 in | Wt 141.0 lb

## 2020-04-17 DIAGNOSIS — Z9889 Other specified postprocedural states: Secondary | ICD-10-CM

## 2020-04-17 DIAGNOSIS — I712 Thoracic aortic aneurysm, without rupture, unspecified: Secondary | ICD-10-CM

## 2020-04-17 DIAGNOSIS — I7101 Dissection of thoracic aorta: Secondary | ICD-10-CM | POA: Diagnosis not present

## 2020-04-17 DIAGNOSIS — Z8679 Personal history of other diseases of the circulatory system: Secondary | ICD-10-CM

## 2020-04-17 MED ORDER — IOPAMIDOL (ISOVUE-370) INJECTION 76%
75.0000 mL | Freq: Once | INTRAVENOUS | Status: AC | PRN
  Administered 2020-04-17: 75 mL via INTRAVENOUS

## 2020-04-19 NOTE — Progress Notes (Signed)
HPI:  The patient [age]year old woman with rheumatoid arthritis, fibromyalgia, IBS, DM and multiple other medial problems who underwent Bentall procedure using a composite pericardial valve/conduit and replacement of the ascending aorta and proximal aortic arch on 09/29/2012.She subsequently developed progressive enlargement of her aortic arch to 5.3 cm and underwent replacement of her aneurysmal aortic arch with aorto-innominate and aorto-left carotid artery bypasson 06/29/2016.  She has had a stable aneurysm of the proximal descending thoracic aorta measuring 4.0 cm on CT scan on 10/17/2018.  CTA of the chest on 10/18/2019 showed a new broad-based outpouching from the left anterior lateral aortic wall in the mid descending thoracic aorta with a slight increase in diameter of 3.9 cm in this area concerning for a broad-based penetrating atherosclerotic ulcer.  This was still below the threshold for intervention but monitored continue close follow-up.  She continues to be very limited in her daily activities due to severe rheumatoid arthritis and osteoarthritis.  She has not been able to pay for Enbrel which seemed to help her.  Current Outpatient Medications  Medication Sig Dispense Refill  . amLODipine (NORVASC) 2.5 MG tablet Take 1 tablet (2.5 mg total) by mouth daily. 90 tablet 3  . aspirin 500 MG EC tablet Take 500 mg by mouth daily.    . Cholecalciferol (VITAMIN D3) 50 MCG (2000 UT) capsule Take 1 capsule (2,000 Units total) by mouth daily. 100 capsule 3  . fluconazole (DIFLUCAN) 100 MG tablet Take 2 tablets (200 mg total) by mouth daily. Take 2 tabs on day#1, then 1 tab daily on Days #2-10 14 tablet 1  . Fluticasone-Umeclidin-Vilant (TRELEGY ELLIPTA) 100-62.5-25 MCG/INH AEPB 1 inh daily 1 each 11  . lidocaine (LIDODERM) 5 % Place 1-2 patches onto the skin daily. Remove & Discard patch within 12 hours or as directed by MD 60 patch 11  . rOPINIRole (REQUIP) 4 MG tablet TAKE 1 TABLET BY  MOUTH 3 TIMES A DAY 270 tablet 3  . torsemide (DEMADEX) 20 MG tablet Take 40 mg by mouth 2 (two) times daily as needed (Swelling).     . hyoscyamine (LEVSIN) 0.125 MG tablet Take 1 tablet (0.125 mg total) by mouth every 4 (four) hours as needed for up to 10 days. 100 tablet 3   No current facility-administered medications for this visit.     Physical Exam: BP 138/82 (BP Location: Left Arm, Patient Position: Sitting)   Pulse 76   Resp 20   Ht 5' (1.524 m)   Wt 141 lb (64 kg)   SpO2 96% Comment: Ra with mask on  BMI 27.54 kg/m  She is an elderly woman who looks uncomfortable.  Walks very slowly using 2 canes. Cardiac exam shows a regular rate and rhythm with a harsh 2/6 systolic murmur along the right sternal border.  There is no diastolic murmur. Lungs are clear.  Diagnostic Tests:  Narrative & Impression  CLINICAL DATA:  85 year old female status post thoracic aortic aneurysm repair. Follow-up study.  EXAM: CT ANGIOGRAPHY CHEST WITH CONTRAST  TECHNIQUE: Multidetector CT imaging of the chest was performed using the standard protocol during bolus administration of intravenous contrast. Multiplanar CT image reconstructions and MIPs were obtained to evaluate the vascular anatomy.  CONTRAST:  Seventy-five mL Isovue 370, intravenous  COMPARISON:  10/18/2019  FINDINGS: Cardiovascular: The heart is normal in size. No pericardial effusion.  Sinues of Valsalva: Status post aortic valve replacement.  Sinotubular Junction: 32 mm ,unchanged  Ascending Aorta: Similar appearing postsurgical changes  after ascending tube graft repair with reimplantation of the right brachiocephalic and left common carotid arteries. No complicating features.  Aortic Arch: 29 mm ,unchanged  Descending aorta: Interval development of chronic appearing dissection flap arising from the proximal descending thoracic aorta, likely from site of previously visualized presumed ulceration.  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 The celiac technique arises from the false lumen but is well perfused without evidence of dynamic or static obstruction. There is accompanying interval aneurysmal degeneration as the mid descending thoracic aorta now measures 49 x 46 mm, previously 39 x 41 mm by my measurements. At the level of the diaphragm the aorta measures 37 x 38 mm, previously 32 x 30 mm.  Branch vessels: Status post reimplantation of the right brachiocephalic and left common carotid arteries without complicating features. Similar appearing diffuse, fusiform aneurysmal change of the proximal left subclavian artery. Partially visualized retropharyngeal courses of the carotid arteries.  Coronary arteries: Normal origins and courses. No significant atherosclerotic calcifications.  Main pulmonary artery: 25 mm ,unchanged. No evidence of central pulmonary embolism.  Pulmonary veins: No anomalous pulmonary venous return. No evidence of left atrial appendage thrombus.  Mediastinum/Nodes: No enlarged mediastinal, hilar, or axillary lymph nodes. Thyroid gland, trachea, and esophagus demonstrate no significant findings.  Lungs/Pleura: Mild upper lobe predominant centrilobular emphysema. No suspicious pulmonary nodules.  Upper Abdomen: The visualized upper abdomen is within normal limits.  Musculoskeletal: Similar appearing severe anterior wedge compression deformity of the T11 vertebral body. Similar appearing severe sigmoid curvature and exaggerated kyphosis of the thoracic spine. Well-healed median sternotomy. Partially visualized left reverse total shoulder arthroplasty. Advanced degenerative changes of the right glenohumeral joint.  Review of the MIP images confirms the above findings.  IMPRESSION: Vascular:  1. Interval development of thoracic aortic dissection extending from previously visualized  broad-based ulceration in the posterior aspect of the proximal descending thoracic aorta to the level of the celiac trunk. The celiac arises from the false lumen, however is well perfused. 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 There is associated interval aneurysmal degeneration of the descending thoracic aorta which now measures up to 49 mm in its midportion (previously 41 mm) and 38 mm at the diaphragm (previously 30 mm). 2. Postsurgical changes after aortic valve replacement and ascending aortic tube graft repair without complicating features.  Non-Vascular:  1. No acute intrathoracic abnormality. 2. Mild centrilobular emphysema.  These results were called by telephone at the time of interpretation on 04/17/2020 at 2:06 pm to provider Gilford Raid , who verbally acknowledged these results.  Ruthann Cancer, MD  [facility]  [facility]   Electronically Signed   By: Ruthann Cancer MD   On: 04/17/2020 14:25      Impression:  I have personally reviewed her CTA of the chest done today and compared to the one done in June 2021.  In the interval she has developed a type B aortic dissection extending from the area of the previously noted broad-based ulceration in the proximal descending thoracic aorta.  This extends down to the level of the celiac trunk which comes off the false lumen and is well perfused.  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  The descending thoracic aorta has enlarged further to 4.9 cm in the midportion although my measurement is slightly less than that at about 4.7 cm.  I  do not think this dissection is amenable to endovascular aneurysm repair because there is not an adequate proximal or distal landing zone.  She is not a candidate for open surgical treatment due to her advanced age and severe debilitation from rheumatoid  arthritis or arthritis.  I think the best option is to continue follow-up and tight blood pressure control in the hope of preventing further enlargement of the aorta and progression of aortic dissection I reviewed the CTA images with her and the reasons for continued medical treatment.  All of her questions have been answered.  Plan:  She will return to see me in 6 months with a CTA of the chest, abdomen, and pelvis to follow-up on her type B aortic dissection and descending thoracic aneurysm.  I spent 20 minutes performing this established patient evaluation and > 50% of this time was spent face to face counseling and coordinating the care of this patient's chronic type B aortic dissection and aortic aneurysm.    Gaye Pollack, MD [facility] [phone]

## 2020-05-27 ENCOUNTER — Other Ambulatory Visit: Payer: Self-pay

## 2020-05-28 ENCOUNTER — Encounter: Payer: Self-pay | Admitting: Internal Medicine

## 2020-05-28 ENCOUNTER — Ambulatory Visit (INDEPENDENT_AMBULATORY_CARE_PROVIDER_SITE_OTHER): Payer: PPO | Admitting: Internal Medicine

## 2020-05-28 DIAGNOSIS — I712 Thoracic aortic aneurysm, without rupture, unspecified: Secondary | ICD-10-CM

## 2020-05-28 DIAGNOSIS — L600 Ingrowing nail: Secondary | ICD-10-CM

## 2020-05-28 DIAGNOSIS — I10 Essential (primary) hypertension: Secondary | ICD-10-CM | POA: Diagnosis not present

## 2020-05-28 DIAGNOSIS — G894 Chronic pain syndrome: Secondary | ICD-10-CM | POA: Diagnosis not present

## 2020-05-28 DIAGNOSIS — J431 Panlobular emphysema: Secondary | ICD-10-CM

## 2020-05-28 MED ORDER — DOXYCYCLINE HYCLATE 100 MG PO TABS: 100.0000 mg | ORAL_TABLET | Freq: Two times a day (BID) | ORAL | 1 refills | Status: DC

## 2020-05-28 MED ORDER — FLUCONAZOLE 100 MG PO TABS
200.0000 mg | ORAL_TABLET | Freq: Every day | ORAL | 1 refills | Status: DC
Start: 2020-05-28 — End: 2020-08-26

## 2020-05-28 MED ORDER — TRELEGY ELLIPTA 100-62.5-25 MCG/INH IN AEPB
INHALATION_SPRAY | RESPIRATORY_TRACT | 11 refills | Status: DC
Start: 2020-05-28 — End: 2021-01-22

## 2020-05-28 NOTE — Assessment & Plan Note (Signed)
BP Readings from Last 3 Encounters:  05/28/20 132/70  04/17/20 138/82  02/26/20 (!) 150/98

## 2020-05-28 NOTE — Assessment & Plan Note (Signed)
Use Lidoderm patch

## 2020-05-28 NOTE — Progress Notes (Signed)
Subjective:  Patient ID: Diane Abbott, female    DOB: 11-06-1934  Age: 86 y.o. MRN: [id number]  CC: Follow-up   HPI Diane Abbott presents for COPD, RLS, OA, chronic pain C/o R big toe pain and swelling x4-5 months  Outpatient Medications Prior to Visit  Medication Sig Dispense Refill   amLODipine (NORVASC) 2.5 MG tablet Take 1 tablet (2.5 mg total) by mouth daily. 90 tablet 3   Fluticasone-Umeclidin-Vilant (TRELEGY ELLIPTA) 100-62.5-25 MCG/INH AEPB 1 inh daily 1 each 11   ibuprofen (ADVIL) 200 MG tablet Take 200 mg by mouth every 6 (six) hours as needed.     lidocaine (LIDODERM) 5 % Place 1-2 patches onto the skin daily. Remove & Discard patch within 12 hours or as directed by MD 60 patch 11   rOPINIRole (REQUIP) 4 MG tablet TAKE 1 TABLET BY MOUTH 3 TIMES A DAY 270 tablet 3   torsemide (DEMADEX) 20 MG tablet Take 40 mg by mouth 2 (two) times daily as needed (Swelling).      hyoscyamine (LEVSIN) 0.125 MG tablet Take 1 tablet (0.125 mg total) by mouth every 4 (four) hours as needed for up to 10 days. 100 tablet 3   aspirin 500 MG EC tablet Take 500 mg by mouth daily.     Cholecalciferol (VITAMIN D3) 50 MCG (2000 UT) capsule Take 1 capsule (2,000 Units total) by mouth daily. (Patient not taking: Reported on 05/28/2020) 100 capsule 3   fluconazole (DIFLUCAN) 100 MG tablet Take 2 tablets (200 mg total) by mouth daily. Take 2 tabs on day#1, then 1 tab daily on Days #2-10 (Patient not taking: Reported on 05/28/2020) 14 tablet 1   No facility-administered medications prior to visit.    ROS: Review of Systems  Constitutional: Positive for fatigue. Negative for activity change, appetite change, chills and unexpected weight change.  HENT: Negative for congestion, mouth sores and sinus pressure.   Eyes: Negative for visual disturbance.  Respiratory: Negative for cough and chest tightness.   Gastrointestinal: Negative for abdominal pain and nausea.  Genitourinary: Negative for difficulty  urinating, frequency and vaginal pain.  Musculoskeletal: Positive for arthralgias, back pain and gait problem.  Skin: Negative for pallor and rash.  Neurological: Negative for dizziness, tremors, weakness, numbness and headaches.  Psychiatric/Behavioral: Positive for sleep disturbance. Negative for confusion. The patient is nervous/anxious.     Objective:  BP 132/70 (BP Location: Left Arm)    Pulse 88    Temp 97.9 F (36.6 C) (Oral)    Wt 135 lb 6.4 oz (61.4 kg)    SpO2 97%    BMI 26.44 kg/m   BP Readings from Last 3 Encounters:  05/28/20 132/70  04/17/20 138/82  02/26/20 (!) 150/98    Wt Readings from Last 3 Encounters:  05/28/20 135 lb 6.4 oz (61.4 kg)  04/17/20 141 lb (64 kg)  02/26/20 142 lb 9.6 oz (64.7 kg)    Physical Exam Constitutional:      General: She is not in acute distress.    Appearance: She is well-developed.  HENT:     Head: Normocephalic.     Right Ear: External ear normal.     Left Ear: External ear normal.     Nose: Nose normal.     Mouth/Throat:     Mouth: Oropharynx is clear and moist.  Eyes:     General:        Right eye: No discharge.        Left eye: No  discharge.     Conjunctiva/sclera: Conjunctivae normal.     Pupils: Pupils are equal, round, and reactive to light.  Neck:     Thyroid: No thyromegaly.     Vascular: No JVD.     Trachea: No tracheal deviation.  Cardiovascular:     Rate and Rhythm: Normal rate and regular rhythm.     Heart sounds: Normal heart sounds.  Pulmonary:     Effort: No respiratory distress.     Breath sounds: No stridor. No wheezing.  Abdominal:     General: Bowel sounds are normal. There is no distension.     Palpations: Abdomen is soft. There is no mass.     Tenderness: There is no abdominal tenderness. There is no guarding or rebound.  Musculoskeletal:        General: Tenderness present. No edema.     Cervical back: Normal range of motion and neck supple.  Lymphadenopathy:     Cervical: No cervical  adenopathy.  Skin:    Findings: No erythema or rash.  Neurological:     Mental Status: She is oriented to person, place, and time.     Cranial Nerves: No cranial nerve deficit.     Motor: No abnormal muscle tone.     Coordination: Coordination abnormal.     Gait: Gait abnormal.     Deep Tendon Reflexes: Reflexes normal.  Psychiatric:        Mood and Affect: Mood and affect normal.        Behavior: Behavior normal.        Thought Content: Thought content normal.        Judgment: Judgment normal.    Using 2 canes  R big toe ingrown toenail  Lab Results  Component Value Date   WBC 3.3 (L) 08/09/2019   HGB 12.5 08/09/2019   HCT 37.4 08/09/2019   PLT 135 (L) 08/09/2019   GLUCOSE 108 (H) 01/22/2020   ALT 7 01/22/2020   AST 16 01/22/2020   NA 141 01/22/2020   K 5.5 (H) 01/22/2020   CL 104 01/22/2020   CREATININE 1.26 (H) 01/22/2020   BUN 24 01/22/2020   CO2 27 01/22/2020   TSH 3.95 04/08/2018   INR 1.48 06/29/2016   HGBA1C 5.5 06/24/2016    CT ANGIO CHEST AORTA W/CM & OR WO/CM  Result Date: 04/17/2020 CLINICAL DATA:  86 year old female status post thoracic aortic aneurysm repair. Follow-up study. EXAM: CT ANGIOGRAPHY CHEST WITH CONTRAST TECHNIQUE: Multidetector CT imaging of the chest was performed using the standard protocol during bolus administration of intravenous contrast. Multiplanar CT image reconstructions and MIPs were obtained to evaluate the vascular anatomy. CONTRAST:  Seventy-five mL Isovue 370, intravenous COMPARISON:  10/18/2019 FINDINGS: Cardiovascular: The heart is normal in size. No pericardial effusion. Sinues of Valsalva: Status post aortic valve replacement. Sinotubular Junction: 32 mm ,unchanged Ascending Aorta: Similar appearing postsurgical changes after ascending tube graft repair with reimplantation of the right brachiocephalic and left common carotid arteries. No complicating features. Aortic Arch: 29 mm ,unchanged Descending aorta: Interval development  of chronic appearing dissection flap arising from the proximal descending thoracic aorta, likely from site of previously visualized presumed ulceration. 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 The celiac technique arises from the false lumen but is well perfused without evidence of dynamic or static obstruction. There is accompanying interval aneurysmal degeneration as the mid descending thoracic aorta now measures 49 x 46 mm, previously 39 x 41  mm by my measurements. At the level of the diaphragm the aorta measures 37 x 38 mm, previously 32 x 30 mm. Branch vessels: Status post reimplantation of the right brachiocephalic and left common carotid arteries without complicating features. Similar appearing diffuse, fusiform aneurysmal change of the proximal left subclavian artery. Partially visualized retropharyngeal courses of the carotid arteries. Coronary arteries: Normal origins and courses. No significant atherosclerotic calcifications. Main pulmonary artery: 25 mm ,unchanged. No evidence of central pulmonary embolism. Pulmonary veins: No anomalous pulmonary venous return. No evidence of left atrial appendage thrombus. Mediastinum/Nodes: No enlarged mediastinal, hilar, or axillary lymph nodes. Thyroid gland, trachea, and esophagus demonstrate no significant findings. Lungs/Pleura: Mild upper lobe predominant centrilobular emphysema. No suspicious pulmonary nodules. Upper Abdomen: The visualized upper abdomen is within normal limits. Musculoskeletal: Similar appearing severe anterior wedge compression deformity of the T11 vertebral body. Similar appearing severe sigmoid curvature and exaggerated kyphosis of the thoracic spine. Well-healed median sternotomy. Partially visualized left reverse total shoulder arthroplasty. Advanced degenerative changes of the right glenohumeral joint. Review of the MIP images confirms the above findings. IMPRESSION: Vascular: 1. Interval  development of thoracic aortic dissection extending from previously visualized broad-based ulceration in the posterior aspect of the proximal descending thoracic aorta to the level of the celiac trunk. The celiac arises from the false lumen, however is well perfused. 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 There is associated interval aneurysmal degeneration of the descending thoracic aorta which now measures up to 49 mm in its midportion (previously 41 mm) and 38 mm at the diaphragm (previously 30 mm). 2. Postsurgical changes after aortic valve replacement and ascending aortic tube graft repair without complicating features. Non-Vascular: 1. No acute intrathoracic abnormality. 2. Mild centrilobular emphysema. These results were called by telephone at the time of interpretation on 04/17/2020 at 2:06 pm to provider Gilford Raid , who verbally acknowledged these results. Ruthann Cancer, MD [facility] Electronically Signed   By: Ruthann Cancer MD   On: 04/17/2020 14:25    Assessment & Plan:    Follow-up: No follow-ups on file.  Walker Kehr, MD

## 2020-05-28 NOTE — Assessment & Plan Note (Signed)
12/21 CT: thoracic aortic dissection extending from previously visualized broad-based ulceration in the posterior aspect of the proximal descending thoracic aorta to the level of the celiac Trunk.  BP control - Norvasc

## 2020-05-28 NOTE — Assessment & Plan Note (Signed)
On Breo 

## 2020-05-28 NOTE — Assessment & Plan Note (Addendum)
R big toe Podiatry ref was offered - declined Doxy x 7 d Diflucan if needed Epsom salts soaks

## 2020-06-03 ENCOUNTER — Encounter: Payer: Self-pay | Admitting: Gastroenterology

## 2020-07-24 ENCOUNTER — Telehealth: Payer: Self-pay | Admitting: Internal Medicine

## 2020-07-24 NOTE — Telephone Encounter (Signed)
Please schedule an appointment for her with any available provider hopefully on Thursday.  Thanks

## 2020-07-24 NOTE — Telephone Encounter (Signed)
Patient calling, asked if Dr. Alain Marion would send her in some medication because she is having chills cough and a fever over 100. I told her she would need a virtual appointment for [place] to evaluate her and with hesitation she agreed. When explaining to her it would have to be with another provider because Dr. Alain Marion doesn't have anything available for the next two weeks she said "I will die by then" and hung up on me. Wanted to make someone aware of the situation.

## 2020-07-25 NOTE — Telephone Encounter (Signed)
Patient declined appointment at this time, states she started taking a z pack that she had from previous illness. Informed her to call us if symptoms worsen or she is not getting better.

## 2020-08-24 ENCOUNTER — Other Ambulatory Visit: Payer: Self-pay

## 2020-08-26 ENCOUNTER — Other Ambulatory Visit: Payer: Self-pay

## 2020-08-26 ENCOUNTER — Ambulatory Visit (INDEPENDENT_AMBULATORY_CARE_PROVIDER_SITE_OTHER): Payer: PPO | Admitting: Internal Medicine

## 2020-08-26 ENCOUNTER — Encounter: Payer: Self-pay | Admitting: Internal Medicine

## 2020-08-26 ENCOUNTER — Ambulatory Visit (INDEPENDENT_AMBULATORY_CARE_PROVIDER_SITE_OTHER): Payer: PPO

## 2020-08-26 DIAGNOSIS — R634 Abnormal weight loss: Secondary | ICD-10-CM | POA: Diagnosis not present

## 2020-08-26 DIAGNOSIS — M4647 Discitis, unspecified, lumbosacral region: Secondary | ICD-10-CM

## 2020-08-26 DIAGNOSIS — I700 Atherosclerosis of aorta: Secondary | ICD-10-CM

## 2020-08-26 DIAGNOSIS — N1831 Chronic kidney disease, stage 3a: Secondary | ICD-10-CM | POA: Diagnosis not present

## 2020-08-26 DIAGNOSIS — R509 Fever, unspecified: Secondary | ICD-10-CM | POA: Diagnosis not present

## 2020-08-26 DIAGNOSIS — R3915 Urgency of urination: Secondary | ICD-10-CM | POA: Diagnosis not present

## 2020-08-26 DIAGNOSIS — R0600 Dyspnea, unspecified: Secondary | ICD-10-CM | POA: Diagnosis not present

## 2020-08-26 LAB — CBC WITH DIFFERENTIAL/PLATELET
Basophils Absolute: 0.1 10*3/uL (ref 0.0–0.1)
Basophils Relative: 1.5 % (ref 0.0–3.0)
Eosinophils Absolute: 0.2 10*3/uL (ref 0.0–0.7)
Eosinophils Relative: 3.4 % (ref 0.0–5.0)
HCT: 28.4 % — ABNORMAL LOW (ref 36.0–46.0)
Hemoglobin: 9.4 g/dL — ABNORMAL LOW (ref 12.0–15.0)
Lymphocytes Relative: 17.2 % (ref 12.0–46.0)
Lymphs Abs: 0.9 10*3/uL (ref 0.7–4.0)
MCHC: 33.2 g/dL (ref 30.0–36.0)
MCV: 85.3 fl (ref 78.0–100.0)
Monocytes Absolute: 0.3 10*3/uL (ref 0.1–1.0)
Monocytes Relative: 5.7 % (ref 3.0–12.0)
Neutro Abs: 3.9 10*3/uL (ref 1.4–7.7)
Neutrophils Relative %: 72.2 % (ref 43.0–77.0)
Platelets: 150 10*3/uL (ref 150.0–400.0)
RBC: 3.33 Mil/uL — ABNORMAL LOW (ref 3.87–5.11)
RDW: 15.2 % (ref 11.5–15.5)
WBC: 5.4 10*3/uL (ref 4.0–10.5)

## 2020-08-26 LAB — URINALYSIS, ROUTINE W REFLEX MICROSCOPIC
Bilirubin Urine: NEGATIVE
Ketones, ur: NEGATIVE
Nitrite: NEGATIVE
Specific Gravity, Urine: 1.015 (ref 1.000–1.030)
Urine Glucose: NEGATIVE
Urobilinogen, UA: 0.2 (ref 0.0–1.0)
pH: 6 (ref 5.0–8.0)

## 2020-08-26 LAB — TSH: TSH: 1.09 u[IU]/mL (ref 0.35–4.50)

## 2020-08-26 LAB — CORTISOL: Cortisol, Plasma: 19.6 ug/dL

## 2020-08-26 LAB — SEDIMENTATION RATE: Sed Rate: 12 mm/hr (ref 0–30)

## 2020-08-26 MED ORDER — FLUCONAZOLE 100 MG PO TABS: 200.0000 mg | ORAL_TABLET | Freq: Every day | ORAL | 1 refills | Status: DC

## 2020-08-26 NOTE — Progress Notes (Signed)
Subjective:  Patient ID: Diane Abbott, female    DOB: 1935/04/28  Age: 86 y.o. MRN: [id number]  CC: Follow-up (3 month f/u)   HPI [facility] presents for chills and fever of 102.5 and tok a Zpack 3 weeks ago C/o weakness, leg pain, wt loss, weakness Taking CBD for pain. COVID test was (-). C/o thrush - can't eat   Outpatient Medications Prior to Visit  Medication Sig Dispense Refill  . amLODipine (NORVASC) 2.5 MG tablet Take 1 tablet (2.5 mg total) by mouth daily. 90 tablet 3  . doxycycline (VIBRA-TABS) 100 MG tablet Take 1 tablet (100 mg total) by mouth 2 (two) times daily. 14 tablet 1  . fluconazole (DIFLUCAN) 100 MG tablet Take 2 tablets (200 mg total) by mouth daily. Take 2 tabs on day#1, then 1 tab daily on Days #2-10 14 tablet 1  . Fluticasone-Umeclidin-Vilant (TRELEGY ELLIPTA) 100-62.5-25 MCG/INH AEPB 1 inh daily 1 each 11  . hyoscyamine (LEVSIN) 0.125 MG tablet Take 1 tablet (0.125 mg total) by mouth every 4 (four) hours as needed for up to 10 days. 100 tablet 3  . ibuprofen (ADVIL) 200 MG tablet Take 200 mg by mouth every 6 (six) hours as needed.    . lidocaine (LIDODERM) 5 % Place 1-2 patches onto the skin daily. Remove & Discard patch within 12 hours or as directed by MD 60 patch 11  . rOPINIRole (REQUIP) 4 MG tablet TAKE 1 TABLET BY MOUTH 3 TIMES A DAY 270 tablet 3  . torsemide (DEMADEX) 20 MG tablet Take 40 mg by mouth 2 (two) times daily as needed (Swelling).      No facility-administered medications prior to visit.    ROS: Review of Systems  Constitutional: Positive for fatigue and unexpected weight change. Negative for activity change, appetite change and chills.  HENT: Negative for congestion, mouth sores and sinus pressure.   Eyes: Negative for visual disturbance.  Respiratory: Positive for shortness of breath. Negative for cough and chest tightness.   Gastrointestinal: Positive for nausea. Negative for abdominal pain and vomiting.  Genitourinary:  Negative for difficulty urinating, frequency and vaginal pain.  Musculoskeletal: Positive for arthralgias, back pain and gait problem.  Skin: Negative for pallor and rash.  Neurological: Positive for weakness. Negative for dizziness, tremors, numbness and headaches.  Psychiatric/Behavioral: Positive for decreased concentration and sleep disturbance. Negative for confusion.    Objective:  BP 138/68 (BP Location: Left Arm)   Pulse 97   Temp 98.2 F (36.8 C) (Oral)   Ht 5' (1.524 m)   Wt 124 lb 9.6 oz (56.5 kg)   SpO2 99%   BMI 24.33 kg/m   BP Readings from Last 3 Encounters:  08/26/20 138/68  05/28/20 132/70  04/17/20 138/82    Wt Readings from Last 3 Encounters:  08/26/20 124 lb 9.6 oz (56.5 kg)  05/28/20 135 lb 6.4 oz (61.4 kg)  04/17/20 141 lb (64 kg)    Physical Exam Constitutional:      General: She is not in acute distress.    Appearance: She is well-developed. She is ill-appearing. She is not toxic-appearing or diaphoretic.  HENT:     Head: Normocephalic.     Right Ear: External ear normal.     Left Ear: External ear normal.     Nose: Nose normal.  Eyes:     General:        Right eye: No discharge.        Left eye: No discharge.  Conjunctiva/sclera: Conjunctivae normal.     Pupils: Pupils are equal, round, and reactive to light.  Neck:     Thyroid: No thyromegaly.     Vascular: No JVD.     Trachea: No tracheal deviation.  Cardiovascular:     Rate and Rhythm: Normal rate and regular rhythm.     Heart sounds: Murmur heard.    Pulmonary:     Effort: No respiratory distress.     Breath sounds: No stridor. No wheezing.  Abdominal:     General: Bowel sounds are normal. There is no distension.     Palpations: Abdomen is soft. There is no mass.     Tenderness: There is no abdominal tenderness. There is no guarding or rebound.  Musculoskeletal:        General: Tenderness present.     Cervical back: Normal range of motion and neck supple.   Lymphadenopathy:     Cervical: No cervical adenopathy.  Skin:    Findings: No erythema or rash.  Neurological:     Mental Status: She is oriented to person, place, and time.     Cranial Nerves: No cranial nerve deficit.     Motor: No abnormal muscle tone.     Coordination: Coordination normal.     Deep Tendon Reflexes: Reflexes normal.  Psychiatric:        Behavior: Behavior normal.        Thought Content: Thought content normal.        Judgment: Judgment normal.   using a walker Very thin  Lab Results  Component Value Date   WBC 3.3 (L) 08/09/2019   HGB 12.5 08/09/2019   HCT 37.4 08/09/2019   PLT 135 (L) 08/09/2019   GLUCOSE 108 (H) 01/22/2020   ALT 7 01/22/2020   AST 16 01/22/2020   NA 141 01/22/2020   K 5.5 (H) 01/22/2020   CL 104 01/22/2020   CREATININE 1.26 (H) 01/22/2020   BUN 24 01/22/2020   CO2 27 01/22/2020   TSH 3.95 04/08/2018   INR 1.48 06/29/2016   HGBA1C 5.5 06/24/2016    CT ANGIO CHEST AORTA W/CM & OR WO/CM  Result Date: 04/17/2020 CLINICAL DATA:  86 year old female status post thoracic aortic aneurysm repair. Follow-up study. EXAM: CT ANGIOGRAPHY CHEST WITH CONTRAST TECHNIQUE: Multidetector CT imaging of the chest was performed using the standard protocol during bolus administration of intravenous contrast. Multiplanar CT image reconstructions and MIPs were obtained to evaluate the vascular anatomy. CONTRAST:  Seventy-five mL Isovue 370, intravenous COMPARISON:  10/18/2019 FINDINGS: Cardiovascular: The heart is normal in size. No pericardial effusion. Sinues of Valsalva: Status post aortic valve replacement. Sinotubular Junction: 32 mm ,unchanged Ascending Aorta: Similar appearing postsurgical changes after ascending tube graft repair with reimplantation of the right brachiocephalic and left common carotid arteries. No complicating features. Aortic Arch: 29 mm ,unchanged Descending aorta: Interval development of chronic appearing dissection flap arising from  the proximal descending thoracic aorta, likely from site of previously visualized presumed ulceration. 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 The celiac technique arises from the false lumen but is well perfused without evidence of dynamic or static obstruction. There is accompanying interval aneurysmal degeneration as the mid descending thoracic aorta now measures 49 x 46 mm, previously 39 x 41 mm by my measurements. At the level of the diaphragm the aorta measures 37 x 38 mm, previously 32 x 30 mm. Branch vessels: Status post reimplantation of the right brachiocephalic and  left common carotid arteries without complicating features. Similar appearing diffuse, fusiform aneurysmal change of the proximal left subclavian artery. Partially visualized retropharyngeal courses of the carotid arteries. Coronary arteries: Normal origins and courses. No significant atherosclerotic calcifications. Main pulmonary artery: 25 mm ,unchanged. No evidence of central pulmonary embolism. Pulmonary veins: No anomalous pulmonary venous return. No evidence of left atrial appendage thrombus. Mediastinum/Nodes: No enlarged mediastinal, hilar, or axillary lymph nodes. Thyroid gland, trachea, and esophagus demonstrate no significant findings. Lungs/Pleura: Mild upper lobe predominant centrilobular emphysema. No suspicious pulmonary nodules. Upper Abdomen: The visualized upper abdomen is within normal limits. Musculoskeletal: Similar appearing severe anterior wedge compression deformity of the T11 vertebral body. Similar appearing severe sigmoid curvature and exaggerated kyphosis of the thoracic spine. Well-healed median sternotomy. Partially visualized left reverse total shoulder arthroplasty. Advanced degenerative changes of the right glenohumeral joint. Review of the MIP images confirms the above findings. IMPRESSION: Vascular: 1. Interval development of thoracic aortic dissection extending  from previously visualized broad-based ulceration in the posterior aspect of the proximal descending thoracic aorta to the level of the celiac trunk. The celiac arises from the false lumen, however is well perfused. 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 There is associated interval aneurysmal degeneration of the descending thoracic aorta which now measures up to 49 mm in its midportion (previously 41 mm) and 38 mm at the diaphragm (previously 30 mm). 2. Postsurgical changes after aortic valve replacement and ascending aortic tube graft repair without complicating features. Non-Vascular: 1. No acute intrathoracic abnormality. 2. Mild centrilobular emphysema. These results were called by telephone at the time of interpretation on 04/17/2020 at 2:06 pm to provider Gilford Raid , who verbally acknowledged these results. Ruthann Cancer, MD [facility] Radiology Electronically Signed   By: Ruthann Cancer MD   On: 04/17/2020 14:25    Assessment & Plan:      Follow-up: No follow-ups on file.  Walker Kehr, MD

## 2020-08-26 NOTE — Assessment & Plan Note (Signed)
TSH, CXR

## 2020-08-26 NOTE — Assessment & Plan Note (Signed)
Labs - CRP, ESR

## 2020-08-26 NOTE — Assessment & Plan Note (Signed)
UA, Cx 

## 2020-08-26 NOTE — Assessment & Plan Note (Signed)
  On diet  

## 2020-08-26 NOTE — Assessment & Plan Note (Signed)
Check ESR, CRP

## 2020-08-26 NOTE — Assessment & Plan Note (Signed)
Check CMET. 

## 2020-08-27 LAB — COMPREHENSIVE METABOLIC PANEL
ALT: 5 U/L (ref 0–35)
AST: 14 U/L (ref 0–37)
Albumin: 3.9 g/dL (ref 3.5–5.2)
Alkaline Phosphatase: 69 U/L (ref 39–117)
BUN: 23 mg/dL (ref 6–23)
CO2: 27 mEq/L (ref 19–32)
Calcium: 10 mg/dL (ref 8.4–10.5)
Chloride: 103 mEq/L (ref 96–112)
Creatinine, Ser: 1.27 mg/dL — ABNORMAL HIGH (ref 0.40–1.20)
GFR: 38.9 mL/min — ABNORMAL LOW (ref >60.00)
Glucose, Bld: 97 mg/dL (ref 70–99)
Potassium: 3.5 mEq/L (ref 3.5–5.1)
Sodium: 142 mEq/L (ref 135–145)
Total Bilirubin: 0.5 mg/dL (ref 0.2–1.2)
Total Protein: 7.8 g/dL (ref 6.0–8.3)

## 2020-08-27 LAB — C-REACTIVE PROTEIN: CRP: 2.2 mg/dL (ref 0.5–20.0)

## 2020-08-28 ENCOUNTER — Other Ambulatory Visit: Payer: Self-pay | Admitting: Internal Medicine

## 2020-08-28 MED ORDER — PANTOPRAZOLE SODIUM 40 MG PO TBEC
40.0000 mg | DELAYED_RELEASE_TABLET | Freq: Every day | ORAL | 3 refills | Status: DC
Start: 2020-08-28 — End: 2020-11-21

## 2020-09-01 LAB — CULTURE, BLOOD (SINGLE)

## 2020-09-09 ENCOUNTER — Other Ambulatory Visit: Payer: Self-pay

## 2020-09-10 ENCOUNTER — Ambulatory Visit (INDEPENDENT_AMBULATORY_CARE_PROVIDER_SITE_OTHER): Payer: PPO | Admitting: Internal Medicine

## 2020-09-10 ENCOUNTER — Encounter: Payer: Self-pay | Admitting: Internal Medicine

## 2020-09-10 DIAGNOSIS — J431 Panlobular emphysema: Secondary | ICD-10-CM | POA: Diagnosis not present

## 2020-09-10 DIAGNOSIS — R634 Abnormal weight loss: Secondary | ICD-10-CM

## 2020-09-10 MED ORDER — BENZONATATE 100 MG PO CAPS: 100.0000 mg | ORAL_CAPSULE | Freq: Three times a day (TID) | ORAL | 1 refills | Status: DC | PRN

## 2020-09-10 NOTE — Assessment & Plan Note (Signed)
Discussed.

## 2020-09-10 NOTE — Assessment & Plan Note (Signed)
On Trelegy Pt refused steroids

## 2020-09-10 NOTE — Progress Notes (Signed)
Subjective:  Patient ID: Diane Abbott, female    DOB: 03-30-1935  Age: 86 y.o. MRN: [id number]  CC: Follow-up (2 week f/u)   HPI Diane Abbott presents for weakness, fatigue, wt loss - worse Trying CBD   Outpatient Medications Prior to Visit  Medication Sig Dispense Refill  . amLODipine (NORVASC) 2.5 MG tablet Take 1 tablet (2.5 mg total) by mouth daily. 90 tablet 3  . fluconazole (DIFLUCAN) 100 MG tablet Take 2 tablets (200 mg total) by mouth daily. Take 2 tabs on day#1, then 1 tab daily on Days #2-10 14 tablet 1  . Fluticasone-Umeclidin-Vilant (TRELEGY ELLIPTA) 100-62.5-25 MCG/INH AEPB 1 inh daily 1 each 11  . lidocaine (LIDODERM) 5 % Place 1-2 patches onto the skin daily. Remove & Discard patch within 12 hours or as directed by MD 60 patch 11  . pantoprazole (PROTONIX) 40 MG tablet Take 1 tablet (40 mg total) by mouth daily. 30 tablet 3  . rOPINIRole (REQUIP) 4 MG tablet TAKE 1 TABLET BY MOUTH 3 TIMES A DAY 270 tablet 3  . torsemide (DEMADEX) 20 MG tablet Take 40 mg by mouth 2 (two) times daily as needed (Swelling).     . hyoscyamine (LEVSIN) 0.125 MG tablet Take 1 tablet (0.125 mg total) by mouth every 4 (four) hours as needed for up to 10 days. 100 tablet 3   No facility-administered medications prior to visit.    ROS: Review of Systems  Constitutional: Positive for fatigue and unexpected weight change. Negative for activity change, appetite change and chills.  HENT: Negative for congestion, mouth sores and sinus pressure.   Eyes: Negative for visual disturbance.  Respiratory: Negative for cough and chest tightness.   Gastrointestinal: Negative for abdominal pain and nausea.  Genitourinary: Negative for difficulty urinating, frequency and vaginal pain.  Musculoskeletal: Positive for arthralgias, back pain, gait problem, myalgias, neck pain and neck stiffness.  Skin: Negative for pallor and rash.  Neurological: Positive for weakness. Negative for dizziness, tremors,  numbness and headaches.  Psychiatric/Behavioral: Negative for confusion and sleep disturbance. The patient is not nervous/anxious.     Objective:  BP (!) 132/52 (BP Location: Left Arm)   Pulse 96   Temp 97.7 F (36.5 C) (Oral)   SpO2 97%   BP Readings from Last 3 Encounters:  09/10/20 (!) 132/52  08/26/20 138/68  05/28/20 132/70    Wt Readings from Last 3 Encounters:  08/26/20 124 lb 9.6 oz (56.5 kg)  05/28/20 135 lb 6.4 oz (61.4 kg)  04/17/20 141 lb (64 kg)    Physical Exam Constitutional:      General: She is not in acute distress.    Appearance: She is well-developed.  HENT:     Head: Normocephalic.     Right Ear: External ear normal.     Left Ear: External ear normal.     Nose: Nose normal.  Eyes:     General:        Right eye: No discharge.        Left eye: No discharge.     Conjunctiva/sclera: Conjunctivae normal.     Pupils: Pupils are equal, round, and reactive to light.  Neck:     Thyroid: No thyromegaly.     Vascular: No JVD.     Trachea: No tracheal deviation.  Cardiovascular:     Rate and Rhythm: Normal rate and regular rhythm.     Heart sounds: Normal heart sounds.  Pulmonary:     Effort: No respiratory  distress.     Breath sounds: No stridor. No wheezing.  Abdominal:     General: Bowel sounds are normal. There is no distension.     Palpations: Abdomen is soft. There is no mass.     Tenderness: There is no abdominal tenderness. There is no guarding or rebound.  Musculoskeletal:        General: Tenderness present.     Cervical back: Normal range of motion and neck supple.  Lymphadenopathy:     Cervical: No cervical adenopathy.  Skin:    Findings: No erythema or rash.  Neurological:     Mental Status: She is oriented to person, place, and time.     Cranial Nerves: No cranial nerve deficit.     Motor: Weakness present. No abnormal muscle tone.     Coordination: Coordination abnormal.     Gait: Gait abnormal.     Deep Tendon Reflexes: Reflexes  normal.  Psychiatric:        Behavior: Behavior normal.        Thought Content: Thought content normal.        Judgment: Judgment normal.   using a walker Thin Mild rhonchi B Pt looks better  Lab Results  Component Value Date   WBC 5.4 08/26/2020   HGB 9.4 (L) 08/26/2020   HCT 28.4 (L) 08/26/2020   PLT 150.0 08/26/2020   GLUCOSE 97 08/26/2020   ALT 5 08/26/2020   AST 14 08/26/2020   NA 142 08/26/2020   K 3.5 08/26/2020   CL 103 08/26/2020   CREATININE 1.27 (H) 08/26/2020   BUN 23 08/26/2020   CO2 27 08/26/2020   TSH 1.09 08/26/2020   INR 1.48 06/29/2016   HGBA1C 5.5 06/24/2016    CT ANGIO CHEST AORTA W/CM & OR WO/CM  Result Date: 04/17/2020 CLINICAL DATA:  86 year old female status post thoracic aortic aneurysm repair. Follow-up study. EXAM: CT ANGIOGRAPHY CHEST WITH CONTRAST TECHNIQUE: Multidetector CT imaging of the chest was performed using the standard protocol during bolus administration of intravenous contrast. Multiplanar CT image reconstructions and MIPs were obtained to evaluate the vascular anatomy. CONTRAST:  Seventy-five mL Isovue 370, intravenous COMPARISON:  10/18/2019 FINDINGS: Cardiovascular: The heart is normal in size. No pericardial effusion. Sinues of Valsalva: Status post aortic valve replacement. Sinotubular Junction: 32 mm ,unchanged Ascending Aorta: Similar appearing postsurgical changes after ascending tube graft repair with reimplantation of the right brachiocephalic and left common carotid arteries. No complicating features. Aortic Arch: 29 mm ,unchanged Descending aorta: Interval development of chronic appearing dissection flap arising from the proximal descending thoracic aorta, likely from site of previously visualized presumed ulceration. 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 The celiac technique arises from the false lumen but is well perfused without evidence of dynamic or static obstruction. There is  accompanying interval aneurysmal degeneration as the mid descending thoracic aorta now measures 49 x 46 mm, previously 39 x 41 mm by my measurements. At the level of the diaphragm the aorta measures 37 x 38 mm, previously 32 x 30 mm. Branch vessels: Status post reimplantation of the right brachiocephalic and left common carotid arteries without complicating features. Similar appearing diffuse, fusiform aneurysmal change of the proximal left subclavian artery. Partially visualized retropharyngeal courses of the carotid arteries. Coronary arteries: Normal origins and courses. No significant atherosclerotic calcifications. Main pulmonary artery: 25 mm ,unchanged. No evidence of central pulmonary embolism. Pulmonary veins: No anomalous pulmonary venous return. No evidence of left atrial  appendage thrombus. Mediastinum/Nodes: No enlarged mediastinal, hilar, or axillary lymph nodes. Thyroid gland, trachea, and esophagus demonstrate no significant findings. Lungs/Pleura: Mild upper lobe predominant centrilobular emphysema. No suspicious pulmonary nodules. Upper Abdomen: The visualized upper abdomen is within normal limits. Musculoskeletal: Similar appearing severe anterior wedge compression deformity of the T11 vertebral body. Similar appearing severe sigmoid curvature and exaggerated kyphosis of the thoracic spine. Well-healed median sternotomy. Partially visualized left reverse total shoulder arthroplasty. Advanced degenerative changes of the right glenohumeral joint. Review of the MIP images confirms the above findings. IMPRESSION: Vascular: 1. Interval development of thoracic aortic dissection extending from previously visualized broad-based ulceration in the posterior aspect of the proximal descending thoracic aorta to the level of the celiac trunk. The celiac arises from the false lumen, however is well perfused. 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 There is  associated interval aneurysmal degeneration of the descending thoracic aorta which now measures up to 49 mm in its midportion (previously 41 mm) and 38 mm at the diaphragm (previously 30 mm). 2. Postsurgical changes after aortic valve replacement and ascending aortic tube graft repair without complicating features. Non-Vascular: 1. No acute intrathoracic abnormality. 2. Mild centrilobular emphysema. These results were called by telephone at the time of interpretation on 04/17/2020 at 2:06 pm to provider Gilford Raid , who verbally acknowledged these results. Ruthann Cancer, MD [facility] Radiology Electronically Signed   By: Ruthann Cancer MD   On: 04/17/2020 14:25    Assessment & Plan:     Follow-up: No follow-ups on file.  Walker Kehr, MD

## 2020-09-24 ENCOUNTER — Other Ambulatory Visit: Payer: Self-pay | Admitting: Surgery

## 2020-09-24 DIAGNOSIS — I712 Thoracic aortic aneurysm, without rupture, unspecified: Secondary | ICD-10-CM

## 2020-10-22 ENCOUNTER — Ambulatory Visit (INDEPENDENT_AMBULATORY_CARE_PROVIDER_SITE_OTHER): Payer: PPO | Admitting: Internal Medicine

## 2020-10-22 ENCOUNTER — Encounter: Payer: Self-pay | Admitting: Internal Medicine

## 2020-10-22 ENCOUNTER — Other Ambulatory Visit: Payer: Self-pay

## 2020-10-22 DIAGNOSIS — Z95828 Presence of other vascular implants and grafts: Secondary | ICD-10-CM

## 2020-10-22 DIAGNOSIS — G8929 Other chronic pain: Secondary | ICD-10-CM

## 2020-10-22 DIAGNOSIS — I700 Atherosclerosis of aorta: Secondary | ICD-10-CM | POA: Diagnosis not present

## 2020-10-22 DIAGNOSIS — G894 Chronic pain syndrome: Secondary | ICD-10-CM | POA: Diagnosis not present

## 2020-10-22 DIAGNOSIS — M25562 Pain in left knee: Secondary | ICD-10-CM | POA: Diagnosis not present

## 2020-10-22 DIAGNOSIS — R634 Abnormal weight loss: Secondary | ICD-10-CM

## 2020-10-22 DIAGNOSIS — R627 Adult failure to thrive: Secondary | ICD-10-CM | POA: Diagnosis not present

## 2020-10-22 MED ORDER — OXYCODONE HCL 5 MG PO TABS: 5.0000 mg | ORAL_TABLET | Freq: Four times a day (QID) | ORAL | 0 refills | Status: DC | PRN

## 2020-10-22 NOTE — Patient Instructions (Addendum)
Try upright walker  Salonpas patches

## 2020-10-22 NOTE — Progress Notes (Addendum)
Subjective:  Patient ID: Diane Abbott, female    DOB: May 19, 1934  Age: 86 y.o. MRN: [id number]  CC: Follow-up (6 week f/u)   HPI Diane Abbott presents for severe L knee pain 10/10 after twisting it , LBP 8/10 Can' walk... Pt saw Diane Abbott - L post TKR checked out ok.  Her friend brings her in.  They had to use a wheelchair today.  She continues to have slow weight loss and some decline in her functional abilities due to weakness, fatigue, pain  Outpatient Medications Prior to Visit  Medication Sig Dispense Refill   amLODipine (NORVASC) 2.5 MG tablet Take 1 tablet (2.5 mg total) by mouth daily. 90 tablet 3   benzonatate (TESSALON) 100 MG capsule Take 1-2 capsules (100-200 mg total) by mouth 3 (three) times daily as needed for cough. 60 capsule 1   Fluticasone-Umeclidin-Vilant (TRELEGY ELLIPTA) 100-62.5-25 MCG/INH AEPB 1 inh daily 1 each 11   lidocaine (LIDODERM) 5 % Place 1-2 patches onto the skin daily. Remove & Discard patch within 12 hours or as directed by MD 60 patch 11   pantoprazole (PROTONIX) 40 MG tablet Take 1 tablet (40 mg total) by mouth daily. 30 tablet 3   rOPINIRole (REQUIP) 4 MG tablet TAKE 1 TABLET BY MOUTH 3 TIMES A DAY 270 tablet 3   torsemide (DEMADEX) 20 MG tablet Take 40 mg by mouth 2 (two) times daily as needed (Swelling).      hyoscyamine (LEVSIN) 0.125 MG tablet Take 1 tablet (0.125 mg total) by mouth every 4 (four) hours as needed for up to 10 days. 100 tablet 3   fluconazole (DIFLUCAN) 100 MG tablet Take 2 tablets (200 mg total) by mouth daily. Take 2 tabs on day#1, then 1 tab daily on Days #2-10 (Patient not taking: Reported on 10/22/2020) 14 tablet 1   No facility-administered medications prior to visit.    ROS: Review of Systems  Constitutional:  Positive for fatigue. Negative for activity change, appetite change, chills and unexpected weight change.  HENT:  Negative for congestion, mouth sores and sinus pressure.   Eyes:  Negative for visual  disturbance.  Respiratory:  Positive for shortness of breath. Negative for cough and chest tightness.   Gastrointestinal:  Negative for abdominal pain and nausea.  Genitourinary:  Negative for difficulty urinating, frequency and vaginal pain.  Musculoskeletal:  Positive for arthralgias, back pain and gait problem.  Skin:  Negative for pallor and rash.  Neurological:  Positive for weakness. Negative for dizziness, tremors, numbness and headaches.  Psychiatric/Behavioral:  Negative for confusion, sleep disturbance and suicidal ideas.    Objective:  BP 132/68 (BP Location: Left Arm)   Pulse 78   Temp 98.3 F (36.8 C) (Oral)   Ht 5' (1.524 m)   Wt 121 lb 9.6 oz (55.2 kg)   SpO2 98%   BMI 23.75 kg/m   BP Readings from Last 3 Encounters:  10/22/20 132/68  09/10/20 (!) 132/52  08/26/20 138/68    Wt Readings from Last 3 Encounters:  10/22/20 121 lb 9.6 oz (55.2 kg)  08/26/20 124 lb 9.6 oz (56.5 kg)  05/28/20 135 lb 6.4 oz (61.4 kg)    Physical Exam Constitutional:      General: She is not in acute distress.    Appearance: Normal appearance. She is well-developed. She is ill-appearing.  HENT:     Head: Normocephalic.     Right Ear: External ear normal.     Left Ear: External ear normal.  Nose: Nose normal.  Eyes:     General:        Right eye: No discharge.        Left eye: No discharge.     Conjunctiva/sclera: Conjunctivae normal.     Pupils: Pupils are equal, round, and reactive to light.  Neck:     Thyroid: No thyromegaly.     Vascular: No JVD.     Trachea: No tracheal deviation.  Cardiovascular:     Rate and Rhythm: Normal rate and regular rhythm.     Heart sounds: Murmur heard.  Pulmonary:     Effort: No respiratory distress.     Breath sounds: No stridor. No wheezing.  Abdominal:     General: Bowel sounds are normal. There is no distension.     Palpations: Abdomen is soft. There is no mass.     Tenderness: There is no abdominal tenderness. There is no  guarding or rebound.  Musculoskeletal:        General: No tenderness.     Cervical back: Normal range of motion and neck supple. No rigidity.     Right lower leg: No edema.     Left lower leg: Edema present.  Lymphadenopathy:     Cervical: No cervical adenopathy.  Skin:    Findings: No erythema or rash.  Neurological:     Mental Status: She is oriented to person, place, and time.     Cranial Nerves: No cranial nerve deficit.     Motor: Weakness present. No abnormal muscle tone.     Coordination: Coordination normal.     Gait: Gait abnormal.     Deep Tendon Reflexes: Reflexes normal.  Psychiatric:        Behavior: Behavior normal.        Thought Content: Thought content normal.        Judgment: Judgment normal.  In a w/c  L knee is painful in the periphery LS w/pain She has a harsh heart murmur   Lab Results  Component Value Date   WBC 5.4 08/26/2020   HGB 9.4 (L) 08/26/2020   HCT 28.4 (L) 08/26/2020   PLT 150.0 08/26/2020   GLUCOSE 97 08/26/2020   ALT 5 08/26/2020   AST 14 08/26/2020   NA 142 08/26/2020   K 3.5 08/26/2020   CL 103 08/26/2020   CREATININE 1.27 (H) 08/26/2020   BUN 23 08/26/2020   CO2 27 08/26/2020   TSH 1.09 08/26/2020   INR 1.48 06/29/2016   HGBA1C 5.5 06/24/2016    CT ANGIO CHEST AORTA W/CM & OR WO/CM  Result Date: 04/17/2020 CLINICAL DATA:  86 year old female status post thoracic aortic aneurysm repair. Follow-up study. EXAM: CT ANGIOGRAPHY CHEST WITH CONTRAST TECHNIQUE: Multidetector CT imaging of the chest was performed using the standard protocol during bolus administration of intravenous contrast. Multiplanar CT image reconstructions and MIPs were obtained to evaluate the vascular anatomy. CONTRAST:  Seventy-five mL Isovue 370, intravenous COMPARISON:  10/18/2019 FINDINGS: Cardiovascular: The heart is normal in size. No pericardial effusion. Sinues of Valsalva: Status post aortic valve replacement. Sinotubular Junction: 32 mm ,unchanged  Ascending Aorta: Similar appearing postsurgical changes after ascending tube graft repair with reimplantation of the right brachiocephalic and left common carotid arteries. No complicating features. Aortic Arch: 29 mm ,unchanged Descending aorta: Interval development of chronic appearing dissection flap arising from the proximal descending thoracic aorta, likely from site of previously visualized presumed ulceration. The dissection flap extends to the level of the celiac where there is  a fenestration. The celiac technique arises from the false lumen but is well perfused without evidence of dynamic or static obstruction. There is accompanying interval aneurysmal degeneration as the mid descending thoracic aorta now measures 49 x 46 mm, previously 39 x 41 mm by my measurements. At the level of the diaphragm the aorta measures 37 x 38 mm, previously 32 x 30 mm. Branch vessels: Status post reimplantation of the right brachiocephalic and left common carotid arteries without complicating features. Similar appearing diffuse, fusiform aneurysmal change of the proximal left subclavian artery. Partially visualized retropharyngeal courses of the carotid arteries. Coronary arteries: Normal origins and courses. No significant atherosclerotic calcifications. Main pulmonary artery: 25 mm ,unchanged. No evidence of central pulmonary embolism. Pulmonary veins: No anomalous pulmonary venous return. No evidence of left atrial appendage thrombus. Mediastinum/Nodes: No enlarged mediastinal, hilar, or axillary lymph nodes. Thyroid gland, trachea, and esophagus demonstrate no significant findings. Lungs/Pleura: Mild upper lobe predominant centrilobular emphysema. No suspicious pulmonary nodules. Upper Abdomen: The visualized upper abdomen is within normal limits. Musculoskeletal: Similar appearing severe anterior wedge compression deformity of the T11 vertebral body. Similar appearing severe sigmoid curvature and exaggerated kyphosis of  the thoracic spine. Well-healed median sternotomy. Partially visualized left reverse total shoulder arthroplasty. Advanced degenerative changes of the right glenohumeral joint. Review of the MIP images confirms the above findings. IMPRESSION: Vascular: 1. Interval development of thoracic aortic dissection extending from previously visualized broad-based ulceration in the posterior aspect of the proximal descending thoracic aorta to the level of the celiac trunk. The celiac arises from the false lumen, however is well perfused. 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 There is associated interval aneurysmal degeneration of the descending thoracic aorta which now measures up to 49 mm in its midportion (previously 41 mm) and 38 mm at the diaphragm (previously 30 mm). 2. Postsurgical changes after aortic valve replacement and ascending aortic tube graft repair without complicating features. Non-Vascular: 1. No acute intrathoracic abnormality. 2. Mild centrilobular emphysema. These results were called by telephone at the time of interpretation on 04/17/2020 at 2:06 pm to provider Gilford Raid , who verbally acknowledged these results. Ruthann Cancer, MD [facility] Radiology Electronically Signed   By: Ruthann Cancer MD   On: 04/17/2020 14:25    Assessment & Plan:   It is a complex case.  The patient has multiple problems.  We discussed treatment plan with the patient and her friend   Walker Kehr, MD

## 2020-10-22 NOTE — Assessment & Plan Note (Signed)
Can' walk... Pt saw Dr Wynelle Link - L post TKR checked out ok W/c or upright walker

## 2020-10-22 NOTE — Assessment & Plan Note (Addendum)
Worse.  This is a difficult situation.  The patient's quality of life is extremely low due to pain and weakness. Try low dose Oxy prn for severe pain.  Potential benefits of a long term opioids use as well as potential risks (i.e. addiction risk, apnea etc) and complications (i.e. Somnolence, constipation and others) were explained to the patient and were aknowledged. She was able to tolerate oxycodone okay in the past. Salonpas patches Can' walk... Pt saw Dr Wynelle Link - L post TKR checked out ok. 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

## 2020-10-23 DIAGNOSIS — R627 Adult failure to thrive: Secondary | ICD-10-CM | POA: Insufficient documentation

## 2020-10-23 NOTE — Assessment & Plan Note (Signed)
Multifactorial.  We will try to alleviate her pain and improve mobility.  See discussion below.

## 2020-10-23 NOTE — Addendum Note (Signed)
Addended by: Cassandria Anger on: 10/23/2020 07:39 AM   Modules accepted: Level of Service

## 2020-10-23 NOTE — Assessment & Plan Note (Signed)
The patient seems to be doing as well as she could.

## 2020-10-23 NOTE — Assessment & Plan Note (Signed)
She will try to eat little better.  Hopefully pain control will assist in improving her appetite

## 2020-10-23 NOTE — Assessment & Plan Note (Signed)
She was unable to tolerate statins

## 2020-11-06 ENCOUNTER — Ambulatory Visit
Admission: RE | Admit: 2020-11-06 | Discharge: 2020-11-06 | Disposition: A | Discharge status: Home / Self Care | Payer: PPO | Source: Ambulatory Visit | Attending: Surgery | Admitting: Surgery

## 2020-11-06 ENCOUNTER — Ambulatory Visit: Payer: PPO | Admitting: Surgery

## 2020-11-06 ENCOUNTER — Other Ambulatory Visit: Payer: Self-pay

## 2020-11-06 DIAGNOSIS — I7101 Dissection of thoracic aorta: Secondary | ICD-10-CM | POA: Diagnosis not present

## 2020-11-06 DIAGNOSIS — I712 Thoracic aortic aneurysm, without rupture, unspecified: Secondary | ICD-10-CM

## 2020-11-06 DIAGNOSIS — M19011 Primary osteoarthritis, right shoulder: Secondary | ICD-10-CM | POA: Diagnosis not present

## 2020-11-06 DIAGNOSIS — I7102 Dissection of abdominal aorta: Secondary | ICD-10-CM | POA: Diagnosis not present

## 2020-11-06 DIAGNOSIS — K4090 Unilateral inguinal hernia, without obstruction or gangrene, not specified as recurrent: Secondary | ICD-10-CM | POA: Diagnosis not present

## 2020-11-06 DIAGNOSIS — K5730 Diverticulosis of large intestine without perforation or abscess without bleeding: Secondary | ICD-10-CM | POA: Diagnosis not present

## 2020-11-06 MED ORDER — IOPAMIDOL (ISOVUE-370) INJECTION 76%
50.0000 mL | Freq: Once | INTRAVENOUS | Status: AC | PRN
  Administered 2020-11-06: 50 mL via INTRAVENOUS

## 2020-11-19 ENCOUNTER — Ambulatory Visit: Payer: PPO | Admitting: Internal Medicine

## 2020-11-20 ENCOUNTER — Other Ambulatory Visit: Payer: Self-pay

## 2020-11-20 ENCOUNTER — Encounter: Payer: Self-pay | Admitting: Surgery

## 2020-11-20 ENCOUNTER — Ambulatory Visit: Payer: PPO | Admitting: Surgery

## 2020-11-20 VITALS — BP 112/70 | HR 84 | Resp 20 | Ht 60.0 in

## 2020-11-20 DIAGNOSIS — Z9889 Other specified postprocedural states: Secondary | ICD-10-CM | POA: Diagnosis not present

## 2020-11-20 DIAGNOSIS — Z8679 Personal history of other diseases of the circulatory system: Secondary | ICD-10-CM

## 2020-11-20 DIAGNOSIS — I712 Thoracic aortic aneurysm, without rupture, unspecified: Secondary | ICD-10-CM

## 2020-11-20 DIAGNOSIS — Z95828 Presence of other vascular implants and grafts: Secondary | ICD-10-CM

## 2020-11-20 DIAGNOSIS — Z952 Presence of prosthetic heart valve: Secondary | ICD-10-CM | POA: Diagnosis not present

## 2020-11-20 NOTE — Progress Notes (Signed)
HPI:  The patient is an 86 year old woman with rheumatoid arthritis, fibromyalgia, IBS, DM and multiple other medial problems who underwent Bentall procedure using a composite pericardial valve/conduit and replacement of the ascending aorta and proximal aortic arch on 09/29/2012.  She subsequently developed progressive enlargement of her aortic arch to 5.3 cm and underwent replacement of her aneurysmal aortic arch with aorto-innominate and aorto-left carotid artery bypass on 06/29/2016.  She has had a stable aneurysm of the proximal descending thoracic aorta measuring 4.0 cm on CT scan on 10/17/2018. CTA of the chest on 10/18/2019 showed a new broad-based outpouching from the left anterior lateral aortic wall in the mid descending thoracic aorta with a slight increase in diameter of 3.9 cm in this area concerning for a broad-based penetrating atherosclerotic ulcer.  This was still below the threshold for intervention.  CTA in 04/2020 showed that she subsequently developed a type B aortic dissection extending from the area of the previously noted broad-based ulceration in the proximal descending thoracic aorta.  This extended down to the level of the celiac trunk which came off the false lumen and was well perfused.  There was fenestration of the dissection flap at the level of the celiac artery.  The descending thoracic aorta had enlarged further to 4.7 cm. She continues to be very limited in her daily activities due to severe rheumatoid arthritis and osteoarthritis.  Since I last saw her she said that she has not able to ambulate due to her left knee.  She has been eating but has been losing weight.  Current Outpatient Medications  Medication Sig Dispense Refill   amLODipine (NORVASC) 2.5 MG tablet Take 1 tablet (2.5 mg total) by mouth daily. 90 tablet 3   benzonatate (TESSALON) 100 MG capsule Take 1-2 capsules (100-200 mg total) by mouth 3 (three) times daily as needed for cough. 60 capsule 1    Fluticasone-Umeclidin-Vilant (TRELEGY ELLIPTA) 100-62.5-25 MCG/INH AEPB 1 inh daily 1 each 11   lidocaine (LIDODERM) 5 % Place 1-2 patches onto the skin daily. Remove & Discard patch within 12 hours or as directed by MD 60 patch 11   oxyCODONE (ROXICODONE) 5 MG immediate release tablet Take 1 tablet (5 mg total) by mouth every 6 (six) hours as needed for severe pain. 20 tablet 0   pantoprazole (PROTONIX) 40 MG tablet Take 1 tablet (40 mg total) by mouth daily. 30 tablet 3   rOPINIRole (REQUIP) 4 MG tablet TAKE 1 TABLET BY MOUTH 3 TIMES A DAY 270 tablet 3   torsemide (DEMADEX) 20 MG tablet Take 40 mg by mouth 2 (two) times daily as needed (Swelling).      hyoscyamine (LEVSIN) 0.125 MG tablet Take 1 tablet (0.125 mg total) by mouth every 4 (four) hours as needed for up to 10 days. 100 tablet 3   No current facility-administered medications for this visit.     Physical Exam: BP 112/70 (BP Location: Left Arm, Patient Position: Sitting)   Pulse 84   Resp 20   Ht 5' (1.524 m)   SpO2 98% Comment: RA  BMI 23.75 kg/m  She is a frail-appearing elderly woman in no distress sitting in a wheelchair. Cardiac exam shows a regular rate and rhythm with a 2/6 systolic murmur along the right sternal border.  There is no diastolic murmur. Lungs are clear.  Diagnostic Tests:  Narrative & Impression  CLINICAL DATA:  Thoracic aortic aneurysm   EXAM: CT ANGIOGRAPHY CHEST, ABDOMEN AND PELVIS  TECHNIQUE: Multidetector CT imaging through the chest, abdomen and pelvis was performed using the standard protocol during bolus administration of intravenous contrast. Multiplanar reconstructed images and MIPs were obtained and reviewed to evaluate the vascular anatomy.   CONTRAST:  89mL ISOVUE-370 IOPAMIDOL (ISOVUE-370) INJECTION 76%   COMPARISON:  CT angiography chest 04/17/2020   FINDINGS: CTA CHEST FINDINGS   Cardiovascular: Heart size is within normal limits. Prosthetic aortic valve is noted.  Postsurgical changes of ascending aortic replacement again seen with reimplantation of the right brachiocephalic and left common carotid arteries.   Aneurysm of the proximal left subclavian artery is unchanged from prior exam measuring up to 1.6 cm.   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   Interval increase in size descending aortic aneurysm measuring up to 4.7 cm which is not significantly changed since the prior examination. 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   The celiac artery is supplied by the false lumen. There is communication between the true and false lumen at the level of the celiac artery origin.   Superior mesenteric and renal arteries originate from the true lumen.   Mediastinum/Nodes: Mildly enlarged pretracheal lymph measuring up to 1.5 cm in short axis is not significantly changed in size since the prior study.   Lungs/Pleura: Lungs are clear. No pleural effusion or pneumothorax.   Musculoskeletal: Advanced degenerative changes of the right glenohumeral joint. Left shoulder prosthesis partially visualized.   Review of the MIP images confirms the above findings.   CTA ABDOMEN AND PELVIS FINDINGS   VASCULAR   Aorta: Upper abdominal aorta at the level of the celiac artery origin is dilated measuring up to 4.0 x 3.7 cm which is not significantly changed since the prior examination. No aneurysm or dissection of the remaining abdominal aorta.   Celiac: Originates from the false lumen.  Patent.   SMA: No significant abnormality.   Renals: No significant abnormality.   IMA: No significant stenosis.   Inflow: No significant abnormality.   Veins: Anterior pelvic subcutaneous varicose veins are noted.   Review of the MIP images confirms the above findings.   NON-VASCULAR   Hepatobiliary: No focal liver abnormality is seen. Status post cholecystectomy. No biliary  dilatation.   Pancreas: Unremarkable. No pancreatic ductal dilatation or surrounding inflammatory changes.   Spleen: Normal in size without focal abnormality.   Adrenals/Urinary Tract: Adrenal glands are not well delineated. No discrete abnormality is identified. Subcentimeter renal hypodensities are too small to fully characterize. Kidneys otherwise unremarkable. Bladder is unremarkable.   Stomach/Bowel: Stomach within normal limits. Appendix is not definitely identified. Descending and sigmoid colon diverticulosis without evidence of acute diverticulitis.   Lymphatic: No enlarged abdominal or pelvic lymph nodes.   Reproductive: Postsurgical changes hysterectomy are seen.   Other: Small fat containing right inguinal hernia.   Musculoskeletal: Degenerative changes are seen throughout the spine. Severe compression deformity of the L1 and mild compression deformity of the L2 vertebral bodies are not significantly changed since prior examination.   Review of the MIP images confirms the above findings.   IMPRESSION: 1. No significant interval change of descending thoracic aortic aneurysm and dissection, measuring up to 4.7 cm. 2. No acute abnormality of the abdomen or pelvis. 3. Unchanged compression deformities L1 and L2 vertebral bodies. 4. Distal colonic diverticulosis without evidence of acute diverticulitis.     Electronically Signed   By: Miachel Roux M.D.   On: 11/06/2020 14:30  Impression:  There has been no significant change in the descending thoracic aortic aneurysm and dissection with a maximum diameter measuring 4.7 cm.  This is still well below the surgical threshold and she would not be a candidate for surgical treatment given her age and comorbid conditions.  I do not think her aneurysm and dissection would be amenable to stent grafting due to lack of adequate proximal and distal landing zones I think the best option is to continue good blood pressure  control.  I reviewed the CTA images with her and her friend.  She would like to continue following this for her own peace of mind although she understands that she is not a surgical candidate.  We will wait another year to check it.  Plan:  She will return to see me in 1 year with a CTA of the chest.  I spent 20 minutes performing this established patient evaluation and > 50% of this time was spent face to face counseling and coordinating the care of this patient's descending thoracic aortic aneurysm and dissection.    Gaye Pollack, MD [facility] [phone]

## 2020-11-21 ENCOUNTER — Ambulatory Visit (INDEPENDENT_AMBULATORY_CARE_PROVIDER_SITE_OTHER): Payer: PPO | Admitting: Internal Medicine

## 2020-11-21 ENCOUNTER — Encounter: Payer: Self-pay | Admitting: Internal Medicine

## 2020-11-21 ENCOUNTER — Other Ambulatory Visit: Payer: Self-pay | Admitting: Internal Medicine

## 2020-11-21 DIAGNOSIS — I712 Thoracic aortic aneurysm, without rupture, unspecified: Secondary | ICD-10-CM

## 2020-11-21 DIAGNOSIS — I700 Atherosclerosis of aorta: Secondary | ICD-10-CM | POA: Diagnosis not present

## 2020-11-21 DIAGNOSIS — G894 Chronic pain syndrome: Secondary | ICD-10-CM | POA: Diagnosis not present

## 2020-11-21 DIAGNOSIS — N1831 Chronic kidney disease, stage 3a: Secondary | ICD-10-CM | POA: Diagnosis not present

## 2020-11-21 DIAGNOSIS — R634 Abnormal weight loss: Secondary | ICD-10-CM

## 2020-11-21 MED ORDER — OXYCODONE HCL 5 MG PO TABS: 5.0000 mg | ORAL_TABLET | Freq: Three times a day (TID) | ORAL | 0 refills | Status: DC | PRN

## 2020-11-21 NOTE — Progress Notes (Signed)
Subjective:  Patient ID: Diane Abbott, female    DOB: 1934/12/31  Age: 86 y.o. MRN: [id number]  CC: Follow-up (4 week f/u)   HPI Diane Abbott presents for chronic pain f/u -a lot better on oxycodone.  She takes 5 mg oxycodone tablet (one quarter twice a day and one half at night) a day.  She comes today with a friend  Outpatient Medications Prior to Visit  Medication Sig Dispense Refill   amLODipine (NORVASC) 2.5 MG tablet Take 1 tablet (2.5 mg total) by mouth daily. 90 tablet 3   benzonatate (TESSALON) 100 MG capsule Take 1-2 capsules (100-200 mg total) by mouth 3 (three) times daily as needed for cough. 60 capsule 1   Fluticasone-Umeclidin-Vilant (TRELEGY ELLIPTA) 100-62.5-25 MCG/INH AEPB 1 inh daily 1 each 11   lidocaine (LIDODERM) 5 % Place 1-2 patches onto the skin daily. Remove & Discard patch within 12 hours or as directed by MD 60 patch 11   rOPINIRole (REQUIP) 4 MG tablet TAKE 1 TABLET BY MOUTH 3 TIMES A DAY 270 tablet 3   torsemide (DEMADEX) 20 MG tablet Take 40 mg by mouth 2 (two) times daily as needed (Swelling).      oxyCODONE (ROXICODONE) 5 MG immediate release tablet Take 1 tablet (5 mg total) by mouth every 6 (six) hours as needed for severe pain. 20 tablet 0   pantoprazole (PROTONIX) 40 MG tablet Take 1 tablet (40 mg total) by mouth daily. 30 tablet 3   hyoscyamine (LEVSIN) 0.125 MG tablet Take 1 tablet (0.125 mg total) by mouth every 4 (four) hours as needed for up to 10 days. 100 tablet 3   No facility-administered medications prior to visit.    ROS: Review of Systems  Constitutional:  Positive for fatigue. Negative for activity change, appetite change, chills and unexpected weight change.  HENT:  Negative for congestion, mouth sores and sinus pressure.   Eyes:  Negative for visual disturbance.  Respiratory:  Positive for shortness of breath. Negative for cough and chest tightness.   Gastrointestinal:  Negative for abdominal pain and nausea.  Genitourinary:   Negative for difficulty urinating, frequency and vaginal pain.  Musculoskeletal:  Positive for arthralgias, back pain and gait problem.  Skin:  Negative for pallor and rash.  Neurological:  Positive for weakness. Negative for dizziness, tremors, numbness and headaches.  Hematological:  Does not bruise/bleed easily.  Psychiatric/Behavioral:  Positive for sleep disturbance. Negative for confusion, dysphoric mood and suicidal ideas. The patient is nervous/anxious.    Objective:  BP (!) 142/60 (BP Location: Left Arm)   Pulse 79   Temp 98.4 F (36.9 C) (Oral)   Ht 5' (1.524 m)   Wt 121 lb 3.2 oz (55 kg)   SpO2 98%   BMI 23.67 kg/m   BP Readings from Last 3 Encounters:  11/21/20 (!) 142/60  11/20/20 112/70  10/22/20 132/68    Wt Readings from Last 3 Encounters:  11/21/20 121 lb 3.2 oz (55 kg)  10/22/20 121 lb 9.6 oz (55.2 kg)  08/26/20 124 lb 9.6 oz (56.5 kg)    Physical Exam Constitutional:      General: She is not in acute distress.    Appearance: She is well-developed. She is ill-appearing.  HENT:     Head: Normocephalic.     Right Ear: External ear normal.     Left Ear: External ear normal.     Nose: Nose normal.  Eyes:     General:  Right eye: No discharge.        Left eye: No discharge.     Conjunctiva/sclera: Conjunctivae normal.     Pupils: Pupils are equal, round, and reactive to light.  Neck:     Thyroid: No thyromegaly.     Vascular: No JVD.     Trachea: No tracheal deviation.  Cardiovascular:     Rate and Rhythm: Normal rate and regular rhythm.     Heart sounds: Murmur heard.  Pulmonary:     Effort: No respiratory distress.     Breath sounds: No stridor. No wheezing or rhonchi.  Abdominal:     General: Bowel sounds are normal. There is no distension.     Palpations: Abdomen is soft. There is no mass.     Tenderness: There is no abdominal tenderness. There is no guarding or rebound.  Musculoskeletal:        General: No tenderness.     Cervical  back: Normal range of motion and neck supple. No rigidity.  Lymphadenopathy:     Cervical: No cervical adenopathy.  Skin:    Findings: No erythema or rash.  Neurological:     Mental Status: She is oriented to person, place, and time.     Cranial Nerves: No cranial nerve deficit.     Motor: Weakness present. No abnormal muscle tone.     Coordination: Coordination normal.     Gait: Gait abnormal.     Deep Tendon Reflexes: Reflexes normal.  Psychiatric:        Behavior: Behavior normal.        Thought Content: Thought content normal.        Judgment: Judgment normal.  The patient is using a walker  Lab Results  Component Value Date   WBC 5.4 08/26/2020   HGB 9.4 (L) 08/26/2020   HCT 28.4 (L) 08/26/2020   PLT 150.0 08/26/2020   GLUCOSE 97 08/26/2020   ALT 5 08/26/2020   AST 14 08/26/2020   NA 142 08/26/2020   K 3.5 08/26/2020   CL 103 08/26/2020   CREATININE 1.27 (H) 08/26/2020   BUN 23 08/26/2020   CO2 27 08/26/2020   TSH 1.09 08/26/2020   INR 1.48 06/29/2016   HGBA1C 5.5 06/24/2016    CT ANGIO CHEST AORTA W/CM & OR WO/CM  Result Date: 11/06/2020 CLINICAL DATA:  Thoracic aortic aneurysm EXAM: CT ANGIOGRAPHY CHEST, ABDOMEN AND PELVIS TECHNIQUE: Multidetector CT imaging through the chest, abdomen and pelvis was performed using the standard protocol during bolus administration of intravenous contrast. Multiplanar reconstructed images and MIPs were obtained and reviewed to evaluate the vascular anatomy. CONTRAST:  67mL ISOVUE-370 IOPAMIDOL (ISOVUE-370) INJECTION 76% COMPARISON:  CT angiography chest 04/17/2020 FINDINGS: CTA CHEST FINDINGS Cardiovascular: Heart size is within normal limits. Prosthetic aortic valve is noted. Postsurgical changes of ascending aortic replacement again seen with reimplantation of the right brachiocephalic and left common carotid arteries. Aneurysm of the proximal left subclavian artery is unchanged from prior exam measuring up to 1.6 cm. Retropharyngeal  course of the internal carotid arteries again noted. Interval increase in size descending aortic aneurysm measuring up to 4.7 cm which is not significantly changed since the prior examination. 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 The celiac artery is supplied by the false lumen. There is communication between the true and false lumen at the level of the celiac artery origin. Superior mesenteric and renal arteries originate from the true lumen. Mediastinum/Nodes: Mildly enlarged  pretracheal lymph measuring up to 1.5 cm in short axis is not significantly changed in size since the prior study. Lungs/Pleura: Lungs are clear. No pleural effusion or pneumothorax. Musculoskeletal: Advanced degenerative changes of the right glenohumeral joint. Left shoulder prosthesis partially visualized. Review of the MIP images confirms the above findings. CTA ABDOMEN AND PELVIS FINDINGS VASCULAR Aorta: Upper abdominal aorta at the level of the celiac artery origin is dilated measuring up to 4.0 x 3.7 cm which is not significantly changed since the prior examination. No aneurysm or dissection of the remaining abdominal aorta. Celiac: Originates from the false lumen.  Patent. SMA: No significant abnormality. Renals: No significant abnormality. IMA: No significant stenosis. Inflow: No significant abnormality. Veins: Anterior pelvic subcutaneous varicose veins are noted. Review of the MIP images confirms the above findings. NON-VASCULAR Hepatobiliary: No focal liver abnormality is seen. Status post cholecystectomy. No biliary dilatation. Pancreas: Unremarkable. No pancreatic ductal dilatation or surrounding inflammatory changes. Spleen: Normal in size without focal abnormality. Adrenals/Urinary Tract: Adrenal glands are not well delineated. No discrete abnormality is identified. Subcentimeter renal hypodensities are too small to fully characterize. Kidneys otherwise unremarkable. Bladder  is unremarkable. Stomach/Bowel: Stomach within normal limits. Appendix is not definitely identified. Descending and sigmoid colon diverticulosis without evidence of acute diverticulitis. Lymphatic: No enlarged abdominal or pelvic lymph nodes. Reproductive: Postsurgical changes hysterectomy are seen. Other: Small fat containing right inguinal hernia. Musculoskeletal: Degenerative changes are seen throughout the spine. Severe compression deformity of the L1 and mild compression deformity of the L2 vertebral bodies are not significantly changed since prior examination. Review of the MIP images confirms the above findings. IMPRESSION: 1. No significant interval change of descending thoracic aortic aneurysm and dissection, measuring up to 4.7 cm. 2. No acute abnormality of the abdomen or pelvis. 3. Unchanged compression deformities L1 and L2 vertebral bodies. 4. Distal colonic diverticulosis without evidence of acute diverticulitis. Electronically Signed   By: Miachel Roux M.D.   On: 11/06/2020 14:30   CT Angio Abd/Pel w/ and/or w/o  Result Date: 11/06/2020 CLINICAL DATA:  Thoracic aortic aneurysm EXAM: CT ANGIOGRAPHY CHEST, ABDOMEN AND PELVIS TECHNIQUE: Multidetector CT imaging through the chest, abdomen and pelvis was performed using the standard protocol during bolus administration of intravenous contrast. Multiplanar reconstructed images and MIPs were obtained and reviewed to evaluate the vascular anatomy. CONTRAST:  53mL ISOVUE-370 IOPAMIDOL (ISOVUE-370) INJECTION 76% COMPARISON:  CT angiography chest 04/17/2020 FINDINGS: CTA CHEST FINDINGS Cardiovascular: Heart size is within normal limits. Prosthetic aortic valve is noted. Postsurgical changes of ascending aortic replacement again seen with reimplantation of the right brachiocephalic and left common carotid arteries. Aneurysm of the proximal left subclavian artery is unchanged from prior exam measuring up to 1.6 cm. Retropharyngeal course of the internal  carotid arteries again noted. Interval increase in size descending aortic aneurysm measuring up to 4.7 cm which is not significantly changed since the prior examination. 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 The celiac artery is supplied by the false lumen. There is communication between the true and false lumen at the level of the celiac artery origin. Superior mesenteric and renal arteries originate from the true lumen. Mediastinum/Nodes: Mildly enlarged pretracheal lymph measuring up to 1.5 cm in short axis is not significantly changed in size since the prior study. Lungs/Pleura: Lungs are clear. No pleural effusion or pneumothorax. Musculoskeletal: Advanced degenerative changes of the right glenohumeral joint. Left shoulder prosthesis partially visualized. Review of the MIP images confirms the above  findings. CTA ABDOMEN AND PELVIS FINDINGS VASCULAR Aorta: Upper abdominal aorta at the level of the celiac artery origin is dilated measuring up to 4.0 x 3.7 cm which is not significantly changed since the prior examination. No aneurysm or dissection of the remaining abdominal aorta. Celiac: Originates from the false lumen.  Patent. SMA: No significant abnormality. Renals: No significant abnormality. IMA: No significant stenosis. Inflow: No significant abnormality. Veins: Anterior pelvic subcutaneous varicose veins are noted. Review of the MIP images confirms the above findings. NON-VASCULAR Hepatobiliary: No focal liver abnormality is seen. Status post cholecystectomy. No biliary dilatation. Pancreas: Unremarkable. No pancreatic ductal dilatation or surrounding inflammatory changes. Spleen: Normal in size without focal abnormality. Adrenals/Urinary Tract: Adrenal glands are not well delineated. No discrete abnormality is identified. Subcentimeter renal hypodensities are too small to fully characterize. Kidneys otherwise unremarkable. Bladder is unremarkable.  Stomach/Bowel: Stomach within normal limits. Appendix is not definitely identified. Descending and sigmoid colon diverticulosis without evidence of acute diverticulitis. Lymphatic: No enlarged abdominal or pelvic lymph nodes. Reproductive: Postsurgical changes hysterectomy are seen. Other: Small fat containing right inguinal hernia. Musculoskeletal: Degenerative changes are seen throughout the spine. Severe compression deformity of the L1 and mild compression deformity of the L2 vertebral bodies are not significantly changed since prior examination. Review of the MIP images confirms the above findings. IMPRESSION: 1. No significant interval change of descending thoracic aortic aneurysm and dissection, measuring up to 4.7 cm. 2. No acute abnormality of the abdomen or pelvis. 3. Unchanged compression deformities L1 and L2 vertebral bodies. 4. Distal colonic diverticulosis without evidence of acute diverticulitis. Electronically Signed   By: Miachel Roux M.D.   On: 11/06/2020 14:30    Assessment & Plan:     Walker Kehr, MD

## 2020-11-21 NOTE — Assessment & Plan Note (Signed)
On Oxy - tolerating a small ammount

## 2020-11-21 NOTE — Assessment & Plan Note (Signed)
Wt Readings from Last 3 Encounters:  11/21/20 121 lb 3.2 oz (55 kg)  10/22/20 121 lb 9.6 oz (55.2 kg)  08/26/20 124 lb 9.6 oz (56.5 kg)

## 2020-11-21 NOTE — Assessment & Plan Note (Addendum)
  We discussed her diagnosis and potential risks of TAA.  She is scheduled to have a chest CT angiogram later.  I think we should do a chest CT without contrast for risk assessment due to her renal insufficiency.  She is not a surgical candidate for aortic aneurysm repair.

## 2020-11-24 NOTE — Assessment & Plan Note (Signed)
Unable to tolerate statins 

## 2020-11-24 NOTE — Assessment & Plan Note (Signed)
  We discussed her diagnosis and potential risks of TAA.  She is scheduled to have a chest CT angiogram later.  I think we should do a chest CT without contrast for risk assessment due to her renal insufficiency.  She is not a surgical candidate for aortic aneurysm repair.

## 2020-12-09 ENCOUNTER — Ambulatory Visit (INDEPENDENT_AMBULATORY_CARE_PROVIDER_SITE_OTHER): Payer: PPO | Admitting: Internal Medicine

## 2020-12-09 ENCOUNTER — Encounter: Payer: Self-pay | Admitting: Internal Medicine

## 2020-12-09 ENCOUNTER — Other Ambulatory Visit: Payer: Self-pay

## 2020-12-09 DIAGNOSIS — G720 Drug-induced myopathy: Secondary | ICD-10-CM

## 2020-12-09 DIAGNOSIS — T466X5A Adverse effect of antihyperlipidemic and antiarteriosclerotic drugs, initial encounter: Secondary | ICD-10-CM

## 2020-12-09 DIAGNOSIS — R627 Adult failure to thrive: Secondary | ICD-10-CM

## 2020-12-09 DIAGNOSIS — M0609 Rheumatoid arthritis without rheumatoid factor, multiple sites: Secondary | ICD-10-CM | POA: Diagnosis not present

## 2020-12-09 NOTE — Progress Notes (Signed)
Subjective:  Patient ID: Diane Abbott, female    DOB: 10/27/1934  Age: 86 y.o. MRN: [id number]  CC: SCOOTER EVALUATION (Scooter evaluation.. need order and ov notes faxed to [facility] w/ [employer] @  [phone] (p) [phone])   HPI KIESA SAKAL presents for a mobility evaluation She is complaining of pain, weakness, inability to ambulate  Outpatient Medications Prior to Visit  Medication Sig Dispense Refill   amLODipine (NORVASC) 2.5 MG tablet Take 1 tablet (2.5 mg total) by mouth daily. 90 tablet 3   benzonatate (TESSALON) 100 MG capsule Take 1-2 capsules (100-200 mg total) by mouth 3 (three) times daily as needed for cough. 60 capsule 1   Fluticasone-Umeclidin-Vilant (TRELEGY ELLIPTA) 100-62.5-25 MCG/INH AEPB 1 inh daily 1 each 11   lidocaine (LIDODERM) 5 % Place 1-2 patches onto the skin daily. Remove & Discard patch within 12 hours or as directed by MD 60 patch 11   oxyCODONE (ROXICODONE) 5 MG immediate release tablet Take 1 tablet (5 mg total) by mouth 3 (three) times daily as needed for severe pain. 90 tablet 0   pantoprazole (PROTONIX) 40 MG tablet TAKE 1 TABLET BY MOUTH EVERY DAY 90 tablet 1   rOPINIRole (REQUIP) 4 MG tablet TAKE 1 TABLET BY MOUTH 3 TIMES A DAY 270 tablet 3   torsemide (DEMADEX) 20 MG tablet Take 40 mg by mouth 2 (two) times daily as needed (Swelling).      hyoscyamine (LEVSIN) 0.125 MG tablet Take 1 tablet (0.125 mg total) by mouth every 4 (four) hours as needed for up to 10 days. 100 tablet 3   No facility-administered medications prior to visit.    ROS: Review of Systems  Constitutional:  Positive for fatigue. Negative for activity change, appetite change, chills and unexpected weight change.  HENT:  Negative for congestion, mouth sores and sinus pressure.   Eyes:  Negative for visual disturbance.  Respiratory:  Negative for cough and chest tightness.   Gastrointestinal:  Negative for abdominal pain and nausea.  Genitourinary:  Negative for  difficulty urinating, frequency and vaginal pain.  Musculoskeletal:  Positive for arthralgias, back pain, gait problem, joint swelling, myalgias, neck pain and neck stiffness.  Skin:  Negative for pallor and rash.  Neurological:  Positive for weakness. Negative for dizziness, tremors, numbness and headaches.  Psychiatric/Behavioral:  Negative for confusion, decreased concentration, sleep disturbance and suicidal ideas.    Objective:  BP 140/72 (BP Location: Left Arm)   Pulse 78   Temp 98.2 F (36.8 C) (Oral)   Ht 5' (1.524 m)   Wt 121 lb (54.9 kg)   SpO2 98%   BMI 23.63 kg/m   BP Readings from Last 3 Encounters:  12/09/20 140/72  11/21/20 (!) 142/60  11/20/20 112/70    Wt Readings from Last 3 Encounters:  12/09/20 121 lb (54.9 kg)  11/21/20 121 lb 3.2 oz (55 kg)  10/22/20 121 lb 9.6 oz (55.2 kg)    Physical Exam Constitutional:      General: She is not in acute distress.    Appearance: She is well-developed.  HENT:     Head: Normocephalic.     Right Ear: External ear normal.     Left Ear: External ear normal.     Nose: Nose normal.  Eyes:     General:        Right eye: No discharge.        Left eye: No discharge.     Conjunctiva/sclera: Conjunctivae normal.  Pupils: Pupils are equal, round, and reactive to light.  Neck:     Thyroid: No thyromegaly.     Vascular: No JVD.     Trachea: No tracheal deviation.  Cardiovascular:     Rate and Rhythm: Normal rate and regular rhythm.     Heart sounds: Murmur heard.  Pulmonary:     Effort: No respiratory distress.     Breath sounds: No stridor. No wheezing.  Abdominal:     General: Bowel sounds are normal. There is no distension.     Palpations: Abdomen is soft. There is no mass.     Tenderness: There is no abdominal tenderness. There is no guarding or rebound.  Musculoskeletal:        General: Tenderness and deformity present.     Cervical back: Normal range of motion and neck supple. No rigidity.     Right lower  leg: No edema.     Left lower leg: No edema.  Lymphadenopathy:     Cervical: No cervical adenopathy.  Skin:    Findings: No erythema or rash.  Neurological:     Mental Status: She is oriented to person, place, and time.     Cranial Nerves: No cranial nerve deficit.     Motor: Weakness present. No abnormal muscle tone.     Coordination: Coordination abnormal.     Gait: Gait abnormal.     Deep Tendon Reflexes: Reflexes normal.  Psychiatric:        Behavior: Behavior normal.        Thought Content: Thought content normal.        Judgment: Judgment normal.  The patient is in a wheelchair.  She has a brace on her left knee.  She is also using a cane. She has severe pain in the lumbar spine on range of motion and severe pain in both knees especially on the left side.  She has bilateral hand/fingers deformities from rheumatoid in her hands.  Shoulders with decreased range of motion and pain.  The patient is thin. She is unable to ambulate with a walker due to pain and weakness; she has to stop after a few steps and sit down.   A power scooter or a power wheel chair will assist the patient in the home with ADLs; including cooking, cleaning and toilet ing. A cane, walker or manual wheel chair will not assist the patient efficiently due to profound weakness, upper extremity pain and weakness of her grip and rheumatoid arthritis deformities of her hands on both sides.  Patient has the mental and physical capacity to follow and operate the electric devices safety instructions. Patient is willing to use in the home as well as outside of the home.      Upper and Lower Extremity Strength Measurement 0-5 scale:   L grip 5- out of 5, elbow flexion/extension 5- out of 5, shoulders flexion/extension 5- out of 5, knee flexion/extension 5- out of 5, ankle flexion/extension 5- out of 5.    A total time of 35 minutes was spent preparing to see the patient, doing mobility eval. Additionally, counseling the  patient regarding power scooter or w/c use.  Finally, documenting clinical information in the health records, coordination of care, educating the patient.    Lab Results  Component Value Date   WBC 5.4 08/26/2020   HGB 9.4 (L) 08/26/2020   HCT 28.4 (L) 08/26/2020   PLT 150.0 08/26/2020   GLUCOSE 97 08/26/2020   ALT 5 08/26/2020  AST 14 08/26/2020   NA 142 08/26/2020   K 3.5 08/26/2020   CL 103 08/26/2020   CREATININE 1.27 (H) 08/26/2020   BUN 23 08/26/2020   CO2 27 08/26/2020   TSH 1.09 08/26/2020   INR 1.48 06/29/2016   HGBA1C 5.5 06/24/2016    CT ANGIO CHEST AORTA W/CM & OR WO/CM  Result Date: 11/06/2020 CLINICAL DATA:  Thoracic aortic aneurysm EXAM: CT ANGIOGRAPHY CHEST, ABDOMEN AND PELVIS TECHNIQUE: Multidetector CT imaging through the chest, abdomen and pelvis was performed using the standard protocol during bolus administration of intravenous contrast. Multiplanar reconstructed images and MIPs were obtained and reviewed to evaluate the vascular anatomy. CONTRAST:  40m ISOVUE-370 IOPAMIDOL (ISOVUE-370) INJECTION 76% COMPARISON:  CT angiography chest 04/17/2020 FINDINGS: CTA CHEST FINDINGS Cardiovascular: Heart size is within normal limits. Prosthetic aortic valve is noted. Postsurgical changes of ascending aortic replacement again seen with reimplantation of the right brachiocephalic and left common carotid arteries. Aneurysm of the proximal left subclavian artery is unchanged from prior exam measuring up to 1.6 cm. 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 Interval increase in size descending aortic aneurysm measuring up to 4.7 cm which is not significantly changed since the prior examination. 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 The celiac artery is supplied by the false lumen. There is communication between the true and false lumen at the level of the celiac artery origin. Superior mesenteric and renal  arteries originate from the true lumen. Mediastinum/Nodes: Mildly enlarged pretracheal lymph measuring up to 1.5 cm in short axis is not significantly changed in size since the prior study. Lungs/Pleura: Lungs are clear. No pleural effusion or pneumothorax. Musculoskeletal: Advanced degenerative changes of the right glenohumeral joint. Left shoulder prosthesis partially visualized. Review of the MIP images confirms the above findings. CTA ABDOMEN AND PELVIS FINDINGS VASCULAR Aorta: Upper abdominal aorta at the level of the celiac artery origin is dilated measuring up to 4.0 x 3.7 cm which is not significantly changed since the prior examination. No aneurysm or dissection of the remaining abdominal aorta. Celiac: Originates from the false lumen.  Patent. SMA: No significant abnormality. Renals: No significant abnormality. IMA: No significant stenosis. Inflow: No significant abnormality. Veins: Anterior pelvic subcutaneous varicose veins are noted. Review of the MIP images confirms the above findings. NON-VASCULAR Hepatobiliary: No focal liver abnormality is seen. Status post cholecystectomy. No biliary dilatation. Pancreas: Unremarkable. No pancreatic ductal dilatation or surrounding inflammatory changes. Spleen: Normal in size without focal abnormality. Adrenals/Urinary Tract: Adrenal glands are not well delineated. No discrete abnormality is identified. Subcentimeter renal hypodensities are too small to fully characterize. Kidneys otherwise unremarkable. Bladder is unremarkable. Stomach/Bowel: Stomach within normal limits. Appendix is not definitely identified. Descending and sigmoid colon diverticulosis without evidence of acute diverticulitis. Lymphatic: No enlarged abdominal or pelvic lymph nodes. Reproductive: Postsurgical changes hysterectomy are seen. Other: Small fat containing right inguinal hernia. Musculoskeletal: Degenerative changes are seen throughout the spine. Severe compression deformity of the L1  and mild compression deformity of the L2 vertebral bodies are not significantly changed since prior examination. Review of the MIP images confirms the above findings. IMPRESSION: 1. No significant interval change of descending thoracic aortic aneurysm and dissection, measuring up to 4.7 cm. 2. No acute abnormality of the abdomen or pelvis. 3. Unchanged compression deformities L1 and L2 vertebral bodies. 4. Distal colonic diverticulosis without evidence of acute diverticulitis. Electronically Signed   By: FMiachel RouxM.D.   On: 11/06/2020  14:30   CT Angio Abd/Pel w/ and/or w/o  Result Date: 11/06/2020 CLINICAL DATA:  Thoracic aortic aneurysm EXAM: CT ANGIOGRAPHY CHEST, ABDOMEN AND PELVIS TECHNIQUE: Multidetector CT imaging through the chest, abdomen and pelvis was performed using the standard protocol during bolus administration of intravenous contrast. Multiplanar reconstructed images and MIPs were obtained and reviewed to evaluate the vascular anatomy. CONTRAST:  52m ISOVUE-370 IOPAMIDOL (ISOVUE-370) INJECTION 76% COMPARISON:  CT angiography chest 04/17/2020 FINDINGS: CTA CHEST FINDINGS Cardiovascular: Heart size is within normal limits. Prosthetic aortic valve is noted. Postsurgical changes of ascending aortic replacement again seen with reimplantation of the right brachiocephalic and left common carotid arteries. Aneurysm of the proximal left subclavian artery is unchanged from prior exam measuring up to 1.6 cm. 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 Interval increase in size descending aortic aneurysm measuring up to 4.7 cm which is not significantly changed since the prior examination. 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 The celiac artery is supplied by the false lumen. There is communication between the true and false lumen at the level of the celiac artery origin. Superior mesenteric and renal arteries originate from the  true lumen. Mediastinum/Nodes: Mildly enlarged pretracheal lymph measuring up to 1.5 cm in short axis is not significantly changed in size since the prior study. Lungs/Pleura: Lungs are clear. No pleural effusion or pneumothorax. Musculoskeletal: Advanced degenerative changes of the right glenohumeral joint. Left shoulder prosthesis partially visualized. Review of the MIP images confirms the above findings. CTA ABDOMEN AND PELVIS FINDINGS VASCULAR Aorta: Upper abdominal aorta at the level of the celiac artery origin is dilated measuring up to 4.0 x 3.7 cm which is not significantly changed since the prior examination. No aneurysm or dissection of the remaining abdominal aorta. Celiac: Originates from the false lumen.  Patent. SMA: No significant abnormality. Renals: No significant abnormality. IMA: No significant stenosis. Inflow: No significant abnormality. Veins: Anterior pelvic subcutaneous varicose veins are noted. Review of the MIP images confirms the above findings. NON-VASCULAR Hepatobiliary: No focal liver abnormality is seen. Status post cholecystectomy. No biliary dilatation. Pancreas: Unremarkable. No pancreatic ductal dilatation or surrounding inflammatory changes. Spleen: Normal in size without focal abnormality. Adrenals/Urinary Tract: Adrenal glands are not well delineated. No discrete abnormality is identified. Subcentimeter renal hypodensities are too small to fully characterize. Kidneys otherwise unremarkable. Bladder is unremarkable. Stomach/Bowel: Stomach within normal limits. Appendix is not definitely identified. Descending and sigmoid colon diverticulosis without evidence of acute diverticulitis. Lymphatic: No enlarged abdominal or pelvic lymph nodes. Reproductive: Postsurgical changes hysterectomy are seen. Other: Small fat containing right inguinal hernia. Musculoskeletal: Degenerative changes are seen throughout the spine. Severe compression deformity of the L1 and mild compression  deformity of the L2 vertebral bodies are not significantly changed since prior examination. Review of the MIP images confirms the above findings. IMPRESSION: 1. No significant interval change of descending thoracic aortic aneurysm and dissection, measuring up to 4.7 cm. 2. No acute abnormality of the abdomen or pelvis. 3. Unchanged compression deformities L1 and L2 vertebral bodies. 4. Distal colonic diverticulosis without evidence of acute diverticulitis. Electronically Signed   By: FMiachel RouxM.D.   On: 11/06/2020 14:30    Assessment & Plan:   AWalker Kehr MD

## 2020-12-09 NOTE — Assessment & Plan Note (Signed)
  A power scooter or a power wheel chair will assist the patient in the home with ADLs; including cooking, cleaning and toilet ing. A cane, walker or manual wheel chair will not assist the patient efficiently due to profound weakness, upper extremity pain and weakness of her grip and rheumatoid arthritis deformities of her hands on both sides.  Patient has the mental and physical capacity to follow and operate the electric devices safety instructions. Patient is willing to use in the home as well as outside of the home.      Upper and Lower Extremity Strength Measurement 0-5 scale:   L grip 5- out of 5, elbow flexion/extension 5- out of 5, shoulders flexion/extension 5- out of 5, knee flexion/extension 5- out of 5, ankle flexion/extension 5- out of 5.

## 2020-12-09 NOTE — Assessment & Plan Note (Signed)
Pt needs a power

## 2020-12-09 NOTE — Patient Instructions (Signed)
[employer] Heavy Duty Upright Walker 450 lb,Bariatric Upright Walker with Wide Seat,Stand up Rollator Mobility Walking Aid for Elderly, Seniors and Adult Red Visit the [employer] 4.4 out of 5 stars    1,497 ratings  159 answered questions $199.99 ($199.99 / Count)

## 2020-12-10 ENCOUNTER — Telehealth: Payer: Self-pay | Admitting: *Deleted

## 2020-12-10 DIAGNOSIS — G2581 Restless legs syndrome: Secondary | ICD-10-CM

## 2020-12-10 DIAGNOSIS — G894 Chronic pain syndrome: Secondary | ICD-10-CM

## 2020-12-10 DIAGNOSIS — M0609 Rheumatoid arthritis without rheumatoid factor, multiple sites: Secondary | ICD-10-CM

## 2020-12-10 DIAGNOSIS — G8929 Other chronic pain: Secondary | ICD-10-CM

## 2020-12-10 DIAGNOSIS — R627 Adult failure to thrive: Secondary | ICD-10-CM

## 2020-12-10 DIAGNOSIS — Z7409 Other reduced mobility: Secondary | ICD-10-CM

## 2020-12-10 NOTE — Telephone Encounter (Signed)
-----   Message from Cassandria Anger, MD sent at 12/09/2020  5:10 PM EDT ----- Regarding: fax Anaissa Macfadden, Please fax my note to SCOOTER EVALUATION (Scooter evaluation.. need order and ov notes faxed to [facility] w/ [employer] @  [phone] (p) [phone])  Thx AP

## 2020-12-12 NOTE — Telephone Encounter (Signed)
Notes faxed to [facility] w/ [employer].Marland KitchenJohny Chess

## 2020-12-29 ENCOUNTER — Other Ambulatory Visit: Payer: Self-pay | Admitting: Internal Medicine

## 2021-01-14 ENCOUNTER — Encounter (HOSPITAL_COMMUNITY): Payer: Self-pay

## 2021-01-14 ENCOUNTER — Inpatient Hospital Stay (HOSPITAL_COMMUNITY)
Admission: EM | Admit: 2021-01-14 | Discharge: 2021-01-18 | DRG: 378 | Disposition: A | Discharge status: Home / Self Care | Payer: PPO | Attending: Internal Medicine | Admitting: Internal Medicine

## 2021-01-14 ENCOUNTER — Other Ambulatory Visit: Payer: Self-pay

## 2021-01-14 ENCOUNTER — Emergency Department (HOSPITAL_COMMUNITY): Payer: PPO

## 2021-01-14 DIAGNOSIS — I248 Other forms of acute ischemic heart disease: Secondary | ICD-10-CM | POA: Diagnosis not present

## 2021-01-14 DIAGNOSIS — K644 Residual hemorrhoidal skin tags: Secondary | ICD-10-CM | POA: Diagnosis not present

## 2021-01-14 DIAGNOSIS — J449 Chronic obstructive pulmonary disease, unspecified: Secondary | ICD-10-CM | POA: Diagnosis present

## 2021-01-14 DIAGNOSIS — F32A Depression, unspecified: Secondary | ICD-10-CM | POA: Diagnosis not present

## 2021-01-14 DIAGNOSIS — R0600 Dyspnea, unspecified: Secondary | ICD-10-CM | POA: Diagnosis present

## 2021-01-14 DIAGNOSIS — Z91040 Latex allergy status: Secondary | ICD-10-CM

## 2021-01-14 DIAGNOSIS — Z90710 Acquired absence of both cervix and uterus: Secondary | ICD-10-CM

## 2021-01-14 DIAGNOSIS — M069 Rheumatoid arthritis, unspecified: Secondary | ICD-10-CM | POA: Diagnosis not present

## 2021-01-14 DIAGNOSIS — I700 Atherosclerosis of aorta: Secondary | ICD-10-CM | POA: Diagnosis present

## 2021-01-14 DIAGNOSIS — Z7722 Contact with and (suspected) exposure to environmental tobacco smoke (acute) (chronic): Secondary | ICD-10-CM | POA: Diagnosis present

## 2021-01-14 DIAGNOSIS — D509 Iron deficiency anemia, unspecified: Secondary | ICD-10-CM | POA: Diagnosis not present

## 2021-01-14 DIAGNOSIS — D649 Anemia, unspecified: Secondary | ICD-10-CM

## 2021-01-14 DIAGNOSIS — Z85828 Personal history of other malignant neoplasm of skin: Secondary | ICD-10-CM

## 2021-01-14 DIAGNOSIS — K633 Ulcer of intestine: Secondary | ICD-10-CM | POA: Diagnosis present

## 2021-01-14 DIAGNOSIS — E1122 Type 2 diabetes mellitus with diabetic chronic kidney disease: Secondary | ICD-10-CM | POA: Diagnosis present

## 2021-01-14 DIAGNOSIS — Z993 Dependence on wheelchair: Secondary | ICD-10-CM

## 2021-01-14 DIAGNOSIS — Z880 Allergy status to penicillin: Secondary | ICD-10-CM

## 2021-01-14 DIAGNOSIS — Z96612 Presence of left artificial shoulder joint: Secondary | ICD-10-CM | POA: Diagnosis present

## 2021-01-14 DIAGNOSIS — Z825 Family history of asthma and other chronic lower respiratory diseases: Secondary | ICD-10-CM

## 2021-01-14 DIAGNOSIS — K648 Other hemorrhoids: Secondary | ICD-10-CM | POA: Diagnosis present

## 2021-01-14 DIAGNOSIS — R0609 Other forms of dyspnea: Secondary | ICD-10-CM

## 2021-01-14 DIAGNOSIS — Z832 Family history of diseases of the blood and blood-forming organs and certain disorders involving the immune mechanism: Secondary | ICD-10-CM

## 2021-01-14 DIAGNOSIS — G8929 Other chronic pain: Secondary | ICD-10-CM | POA: Diagnosis present

## 2021-01-14 DIAGNOSIS — D61818 Other pancytopenia: Secondary | ICD-10-CM

## 2021-01-14 DIAGNOSIS — Z96653 Presence of artificial knee joint, bilateral: Secondary | ICD-10-CM | POA: Diagnosis present

## 2021-01-14 DIAGNOSIS — Z8249 Family history of ischemic heart disease and other diseases of the circulatory system: Secondary | ICD-10-CM

## 2021-01-14 DIAGNOSIS — K222 Esophageal obstruction: Secondary | ICD-10-CM | POA: Diagnosis present

## 2021-01-14 DIAGNOSIS — N183 Chronic kidney disease, stage 3 unspecified: Secondary | ICD-10-CM | POA: Diagnosis not present

## 2021-01-14 DIAGNOSIS — K649 Unspecified hemorrhoids: Secondary | ICD-10-CM | POA: Diagnosis not present

## 2021-01-14 DIAGNOSIS — Z8679 Personal history of other diseases of the circulatory system: Secondary | ICD-10-CM

## 2021-01-14 DIAGNOSIS — Z20822 Contact with and (suspected) exposure to covid-19: Secondary | ICD-10-CM | POA: Diagnosis not present

## 2021-01-14 DIAGNOSIS — K760 Fatty (change of) liver, not elsewhere classified: Secondary | ICD-10-CM | POA: Diagnosis present

## 2021-01-14 DIAGNOSIS — Z881 Allergy status to other antibiotic agents status: Secondary | ICD-10-CM

## 2021-01-14 DIAGNOSIS — Z90722 Acquired absence of ovaries, bilateral: Secondary | ICD-10-CM

## 2021-01-14 DIAGNOSIS — K559 Vascular disorder of intestine, unspecified: Secondary | ICD-10-CM | POA: Diagnosis not present

## 2021-01-14 DIAGNOSIS — Z885 Allergy status to narcotic agent status: Secondary | ICD-10-CM

## 2021-01-14 DIAGNOSIS — K219 Gastro-esophageal reflux disease without esophagitis: Secondary | ICD-10-CM | POA: Diagnosis present

## 2021-01-14 DIAGNOSIS — G2581 Restless legs syndrome: Secondary | ICD-10-CM | POA: Diagnosis not present

## 2021-01-14 DIAGNOSIS — R1319 Other dysphagia: Secondary | ICD-10-CM | POA: Diagnosis present

## 2021-01-14 DIAGNOSIS — E2740 Unspecified adrenocortical insufficiency: Secondary | ICD-10-CM | POA: Diagnosis not present

## 2021-01-14 DIAGNOSIS — N1832 Chronic kidney disease, stage 3b: Secondary | ICD-10-CM | POA: Diagnosis present

## 2021-01-14 DIAGNOSIS — K5730 Diverticulosis of large intestine without perforation or abscess without bleeding: Secondary | ICD-10-CM | POA: Diagnosis not present

## 2021-01-14 DIAGNOSIS — K5521 Angiodysplasia of colon with hemorrhage: Principal | ICD-10-CM | POA: Diagnosis present

## 2021-01-14 DIAGNOSIS — I129 Hypertensive chronic kidney disease with stage 1 through stage 4 chronic kidney disease, or unspecified chronic kidney disease: Secondary | ICD-10-CM | POA: Diagnosis present

## 2021-01-14 DIAGNOSIS — K581 Irritable bowel syndrome with constipation: Secondary | ICD-10-CM | POA: Diagnosis present

## 2021-01-14 DIAGNOSIS — K5520 Angiodysplasia of colon without hemorrhage: Secondary | ICD-10-CM | POA: Diagnosis not present

## 2021-01-14 DIAGNOSIS — D62 Acute posthemorrhagic anemia: Secondary | ICD-10-CM | POA: Diagnosis present

## 2021-01-14 DIAGNOSIS — M797 Fibromyalgia: Secondary | ICD-10-CM | POA: Diagnosis present

## 2021-01-14 DIAGNOSIS — Z953 Presence of xenogenic heart valve: Secondary | ICD-10-CM

## 2021-01-14 DIAGNOSIS — Z9049 Acquired absence of other specified parts of digestive tract: Secondary | ICD-10-CM

## 2021-01-14 DIAGNOSIS — R627 Adult failure to thrive: Secondary | ICD-10-CM | POA: Diagnosis present

## 2021-01-14 DIAGNOSIS — Z86718 Personal history of other venous thrombosis and embolism: Secondary | ICD-10-CM

## 2021-01-14 DIAGNOSIS — N179 Acute kidney failure, unspecified: Secondary | ICD-10-CM | POA: Diagnosis not present

## 2021-01-14 DIAGNOSIS — Z66 Do not resuscitate: Secondary | ICD-10-CM | POA: Diagnosis not present

## 2021-01-14 DIAGNOSIS — Z6823 Body mass index (BMI) 23.0-23.9, adult: Secondary | ICD-10-CM

## 2021-01-14 DIAGNOSIS — R0602 Shortness of breath: Secondary | ICD-10-CM | POA: Diagnosis not present

## 2021-01-14 DIAGNOSIS — Z7951 Long term (current) use of inhaled steroids: Secondary | ICD-10-CM

## 2021-01-14 DIAGNOSIS — Z886 Allergy status to analgesic agent status: Secondary | ICD-10-CM

## 2021-01-14 DIAGNOSIS — Z888 Allergy status to other drugs, medicaments and biological substances status: Secondary | ICD-10-CM

## 2021-01-14 DIAGNOSIS — Z882 Allergy status to sulfonamides status: Secondary | ICD-10-CM

## 2021-01-14 DIAGNOSIS — K922 Gastrointestinal hemorrhage, unspecified: Secondary | ICD-10-CM | POA: Diagnosis present

## 2021-01-14 DIAGNOSIS — Z86711 Personal history of pulmonary embolism: Secondary | ICD-10-CM

## 2021-01-14 DIAGNOSIS — Z79899 Other long term (current) drug therapy: Secondary | ICD-10-CM

## 2021-01-14 DIAGNOSIS — R0902 Hypoxemia: Secondary | ICD-10-CM | POA: Diagnosis not present

## 2021-01-14 DIAGNOSIS — E876 Hypokalemia: Secondary | ICD-10-CM | POA: Diagnosis present

## 2021-01-14 LAB — BASIC METABOLIC PANEL
Anion gap: 9 (ref 5–15)
BUN: 32 mg/dL — ABNORMAL HIGH (ref 8–23)
CO2: 22 mmol/L (ref 22–32)
Calcium: 8.6 mg/dL — ABNORMAL LOW (ref 8.9–10.3)
Chloride: 107 mmol/L (ref 98–111)
Creatinine, Ser: 1.45 mg/dL — ABNORMAL HIGH (ref 0.44–1.00)
GFR, Estimated: 36 mL/min — ABNORMAL LOW (ref >60)
Glucose, Bld: 100 mg/dL — ABNORMAL HIGH (ref 70–99)
Potassium: 3.4 mmol/L — ABNORMAL LOW (ref 3.5–5.1)
Sodium: 138 mmol/L (ref 135–145)

## 2021-01-14 LAB — RETICULOCYTES
Immature Retic Fract: 33.9 % — ABNORMAL HIGH (ref 2.3–15.9)
RBC.: 1.19 MIL/uL — ABNORMAL LOW (ref 3.87–5.11)
Retic Count, Absolute: 58.9 10*3/uL (ref 19.0–186.0)
Retic Ct Pct: 5 % — ABNORMAL HIGH (ref 0.4–3.1)

## 2021-01-14 LAB — CBC WITH DIFFERENTIAL/PLATELET
Abs Immature Granulocytes: 0.02 10*3/uL (ref 0.00–0.07)
Basophils Absolute: 0 10*3/uL (ref 0.0–0.1)
Basophils Relative: 1 %
Eosinophils Absolute: 0.1 10*3/uL (ref 0.0–0.5)
Eosinophils Relative: 3 %
HCT: 9.9 % — ABNORMAL LOW (ref 36.0–46.0)
Hemoglobin: 2.7 g/dL — CL (ref 12.0–15.0)
Immature Granulocytes: 1 %
Lymphocytes Relative: 21 %
Lymphs Abs: 0.7 10*3/uL (ref 0.7–4.0)
MCH: 22.1 pg — ABNORMAL LOW (ref 26.0–34.0)
MCHC: 27.3 g/dL — ABNORMAL LOW (ref 30.0–36.0)
MCV: 81.1 fL (ref 80.0–100.0)
Monocytes Absolute: 0.2 10*3/uL (ref 0.1–1.0)
Monocytes Relative: 7 %
Neutro Abs: 2.2 10*3/uL (ref 1.7–7.7)
Neutrophils Relative %: 67 %
Platelets: 87 10*3/uL — ABNORMAL LOW (ref 150–400)
RBC: 1.22 MIL/uL — ABNORMAL LOW (ref 3.87–5.11)
RDW: 17.7 % — ABNORMAL HIGH (ref 11.5–15.5)
WBC: 3.2 10*3/uL — ABNORMAL LOW (ref 4.0–10.5)
nRBC: 1.3 % — ABNORMAL HIGH (ref 0.0–0.2)

## 2021-01-14 LAB — POC OCCULT BLOOD, ED: Fecal Occult Bld: NEGATIVE

## 2021-01-14 LAB — TROPONIN I (HIGH SENSITIVITY)
Troponin I (High Sensitivity): 45 ng/L — ABNORMAL HIGH (ref <18)
Troponin I (High Sensitivity): 42 ng/L — ABNORMAL HIGH (ref <18)

## 2021-01-14 LAB — HEMOGLOBIN AND HEMATOCRIT, BLOOD
HCT: 10.2 % — ABNORMAL LOW (ref 36.0–46.0)
Hemoglobin: 2.7 g/dL — CL (ref 12.0–15.0)

## 2021-01-14 LAB — RESP PANEL BY RT-PCR (FLU A&B, COVID) ARPGX2
Influenza A by PCR: NEGATIVE
Influenza B by PCR: NEGATIVE
SARS Coronavirus 2 by RT PCR: NEGATIVE

## 2021-01-14 LAB — BRAIN NATRIURETIC PEPTIDE: B Natriuretic Peptide: 1183.4 pg/mL — ABNORMAL HIGH (ref 0.0–100.0)

## 2021-01-14 LAB — PROTIME-INR
INR: 1.3 — ABNORMAL HIGH (ref 0.8–1.2)
Prothrombin Time: 16.2 seconds — ABNORMAL HIGH (ref 11.4–15.2)

## 2021-01-14 LAB — D-DIMER, QUANTITATIVE: D-Dimer, Quant: 4.75 ug/mL-FEU — ABNORMAL HIGH (ref 0.00–0.50)

## 2021-01-14 LAB — PREPARE RBC (CROSSMATCH)

## 2021-01-14 MED ORDER — METOPROLOL TARTRATE 12.5 MG HALF TABLET: 12.5000 mg | ORAL_TABLET | Freq: Four times a day (QID) | ORAL | Status: DC | PRN

## 2021-01-14 MED ORDER — OXYCODONE HCL 5 MG PO TABS
5.0000 mg | ORAL_TABLET | Freq: Three times a day (TID) | ORAL | Status: DC | PRN
Start: 2021-01-14 — End: 2021-01-18
  Administered 2021-01-15 – 2021-01-18 (×6): 5 mg via ORAL
  Filled 2021-01-14 (×7): qty 1

## 2021-01-14 MED ORDER — POTASSIUM CHLORIDE CRYS ER 20 MEQ PO TBCR
40.0000 meq | EXTENDED_RELEASE_TABLET | Freq: Once | ORAL | Status: AC
  Administered 2021-01-14: 40 meq via ORAL
  Filled 2021-01-14: qty 2

## 2021-01-14 MED ORDER — ROPINIROLE HCL 1 MG PO TABS
2.0000 mg | ORAL_TABLET | Freq: Once | ORAL | Status: AC
  Administered 2021-01-14: 2 mg via ORAL
  Filled 2021-01-14: qty 2

## 2021-01-14 MED ORDER — SODIUM CHLORIDE 0.9 % IV SOLN
10.0000 mL/h | Freq: Once | INTRAVENOUS | Status: AC
  Administered 2021-01-14: 10 mL/h via INTRAVENOUS

## 2021-01-14 MED ORDER — CALCIUM GLUCONATE-NACL 1-0.675 GM/50ML-% IV SOLN
1.0000 g | Freq: Once | INTRAVENOUS | Status: AC
  Administered 2021-01-14: 1000 mg via INTRAVENOUS
  Filled 2021-01-14: qty 50

## 2021-01-14 MED ORDER — FLUTICASONE FUROATE-VILANTEROL 100-25 MCG/INH IN AEPB
1.0000 | INHALATION_SPRAY | Freq: Every day | RESPIRATORY_TRACT | Status: DC
  Administered 2021-01-15 – 2021-01-18 (×4): 1 via RESPIRATORY_TRACT
  Filled 2021-01-14 (×2): qty 28

## 2021-01-14 MED ORDER — ROPINIROLE HCL 1 MG PO TABS
4.0000 mg | ORAL_TABLET | Freq: Three times a day (TID) | ORAL | Status: DC
  Filled 2021-01-14 (×2): qty 4

## 2021-01-14 MED ORDER — PANTOPRAZOLE SODIUM 40 MG IV SOLR
40.0000 mg | Freq: Two times a day (BID) | INTRAVENOUS | Status: DC
  Administered 2021-01-15 – 2021-01-17 (×5): 40 mg via INTRAVENOUS
  Filled 2021-01-14 (×5): qty 40

## 2021-01-14 MED ORDER — PANTOPRAZOLE SODIUM 40 MG IV SOLR
40.0000 mg | Freq: Once | INTRAVENOUS | Status: AC
  Administered 2021-01-14: 40 mg via INTRAVENOUS
  Filled 2021-01-14: qty 40

## 2021-01-14 MED ORDER — SODIUM CHLORIDE 0.9% IV SOLUTION: Freq: Once | INTRAVENOUS | Status: AC

## 2021-01-14 MED ORDER — FUROSEMIDE 10 MG/ML IJ SOLN
20.0000 mg | Freq: Once | INTRAMUSCULAR | Status: AC
  Administered 2021-01-14: 20 mg via INTRAVENOUS
  Filled 2021-01-14: qty 2

## 2021-01-14 MED ORDER — ROPINIROLE HCL 1 MG PO TABS
4.0000 mg | ORAL_TABLET | Freq: Three times a day (TID) | ORAL | Status: DC
  Administered 2021-01-14 – 2021-01-18 (×11): 4 mg via ORAL
  Filled 2021-01-14 (×14): qty 4

## 2021-01-14 MED ORDER — ROPINIROLE HCL 1 MG PO TABS
2.0000 mg | ORAL_TABLET | Freq: Once | ORAL | Status: DC
  Filled 2021-01-14 (×2): qty 2

## 2021-01-14 NOTE — H&P (Signed)
History and Physical    Diane Abbott [id number] DOB: 15-Feb-1935 DOA: 01/14/2021  PCP: Cassandria Anger, MD (Confirm with patient/family/NH records and if not entered, this has to be entered at [facility] point of entry) Patient coming from: Home  I have personally briefly reviewed patient's old medical records in [place]  Chief Complaint: SOB  HPI: Diane Abbott is a 86 y.o. female with medical history significant of thoracic and abdominal aneurysm status post repair x3 and biosynthetic aortic valve replacement on low-dose BP meds for the AAA and chronic borderline hypotension, CKD stage II, history of hiatal hernia, chronic ambulation dysfunction secondary to left knee severe OA on chronic narcotics, presented with increasing shortness of breath.  Daughter reported that the patient has chronic constipation, usually moves her bowels every 1 to 2 weeks, patient never complained about abdominal pain nauseous.  Last Friday, patient passed dark-colored liquid stool with coffee-ground material, and patient complaining about feeling lightheadedness after that.  Although patient denied to be brought into the hospital.  This morning, daughter found patient " very winded" and looks pale.  EMS arrived and found patient O2 saturation 80s, stabilized on 4 L.  Patient has had extensive thoracic and intra-abdominal aneurysm repair, follows with vascular surgeon every 6 months for CT angiogram.  Most recent CTA was done in July 2022 shows stable aneurysm.  Incidentally, CT also found patient had extensive diverticulosis.  ED Course: Hb=2.7, blood pressure borderline low but no tachycardia.  Troponin 45> 42,  Review of Systems: As per HPI otherwise 14 point review of systems negative.    Past Medical History:  Diagnosis Date   Adrenal insufficiency ([place])    Anemia    Anginal pain ([place])    "comes and goes has occured since 2012"   Blood dyscrasia    "problems with platelets, red cells and  white cells being low"   Bronchitis Jan 2013-March 2013   severe   Cancer [facility])    skin cancer on face   Cataracts, bilateral    more in left than right   COPD (chronic obstructive pulmonary disease) (HCC)    Depression    Esophagitis    Fatty liver    Fibromyalgia    Fibromyalgia    Foot pain, left 08/26/2016   2/18 1st MTP - gout vs other   GERD (gastroesophageal reflux disease)    Heart murmur    History of blood clots 2008   below knee post trauma. Dilated [facility] no clotting disorder felt to be present.   History of echocardiogram    Echo 11/16: Mild LVH, EF 60-65%, normal wall motion, grade 1 diastolic dysfunction, bioprosthetic AVR with mean gradient 21 mmHg and no regurgitation, proximal aortic arch aneurysm 49 mm, moderate TR, PASP 31 mmHg   Hypertension    IBS (irritable bowel syndrome)    Leaky heart valve    Osteoarthritis    Osteoarthritis of left shoulder 12/07/2011   Osteopenia    Pulmonary embolism (HCC)    RA (rheumatoid arthritis) (HCC)    Shortness of breath    attributed to aneurysm   Sliding hiatal hernia    Urinary leakage    when coughing   Vertigo    hx of   Vitamin B 12 deficiency    Vitamin D deficiency     Past Surgical History:  Procedure Laterality Date   ABDOMINAL HYSTERECTOMY     AORTA SURGERY  12/2012   Resection of aneurysm and valve  replacement   APPENDECTOMY  1956   BENTALL PROCEDURE N/A 09/29/2012   Procedure: BENTALL PROCEDURE;  Surgeon: Gaye Pollack, MD;  Location: [place];  Service: Open Heart Surgery;  Laterality: N/A;  WITH CIRC ARREST   BILATERAL OOPHORECTOMY     BREAST BIOPSY Left    CARDIAC CATHETERIZATION  09/15/12   no PCI   CATARACT EXTRACTION Bilateral    CHOLECYSTECTOMY  2003   COLONOSCOPY     COSMETIC SURGERY     Tummy tuck   INTRAOPERATIVE TRANSESOPHAGEAL ECHOCARDIOGRAM N/A 09/29/2012   Procedure: INTRAOPERATIVE TRANSESOPHAGEAL ECHOCARDIOGRAM;  Surgeon: Gaye Pollack, MD;  Location: [place] OR;  Service: Open  Heart Surgery;  Laterality: N/A;   JOINT REPLACEMENT Bilateral    REPLACEMENT ASCENDING AORTA N/A 06/29/2016   Procedure: AORTIC ARCH REPLACEMENT WITH CIRC ARREST (N/A) - RIGHT AXILLARY CANNULATION  AORTO-AORTIC 28 HEMASHIELD GRAFT WITH AORTO-INNOMINATE AND AORTO-LEFT CAROTID ANASTAMOSIS;  Surgeon: Gaye Pollack, MD;  Location: [place] [facility];  Service: Open Heart Surgery;  Laterality: N/A;  RIGHT AXILLARY CANNULATION  BILATERAL RADIAL A-LINE   SKIN CANCER EXCISION Right    cheek   TEE WITHOUT CARDIOVERSION N/A 06/29/2016   Procedure: TRANSESOPHAGEAL ECHOCARDIOGRAM (TEE);  Surgeon: Gaye Pollack, MD;  Location: [place];  Service: Open Heart Surgery;  Laterality: N/A;   TONSILLECTOMY  1941   TOTAL KNEE ARTHROPLASTY     bilateral   TOTAL SHOULDER ARTHROPLASTY  12/07/2011   Procedure: TOTAL SHOULDER ARTHROPLASTY;  Surgeon: Johnny Bridge, MD;  Location: [place];  Service: Orthopedics;  Laterality: Left;  left total shoulder artthroplasty   WRIST SURGERY     bilateral     reports that she has never smoked. She has never used smokeless tobacco. She reports that she does not currently use alcohol. She reports that she does not use drugs.  Allergies  Allergen Reactions   Acetaminophen     Eyes dilate; pt states "can't see"   Codeine Other (See Comments)    Blood pressure drops; "I pass out."    Pramipexole Dihydrochloride Nausea Only    sick, falling   Rofecoxib Other (See Comments)    "sleep walks"   Venlafaxine Other (See Comments)    migraines   Arava [Leflunomide] Swelling   Clonazepam     Unknown; pt can't remember   Diclofenac Sodium     Unknown; pt can't remember   Sulfa Antibiotics     constipation   Alprazolam Other (See Comments)    "Makes me mean"   Darvocet [Propoxyphene N-Acetaminophen] Other (See Comments)    "makes my eyes dilate. Pt stated I can't see"   Duloxetine Other (See Comments)    achy legs   Gabapentin Other (See Comments)    headache   Latex Itching and Dermatitis     When examined with latex gloves, burns and itches in contact areas per pt.   Morphine And Related Other (See Comments)    Hallucinations    Nortriptyline Hcl Other (See Comments)    Migraines    Penicillins Rash    Has patient had a PCN reaction causing immediate rash, facial/tongue/throat swelling, SOB or lightheadedness with hypotension: Yes Has patient had a PCN reaction causing severe rash involving mucus membranes or skin necrosis: Yes Has patient had a PCN reaction that required hospitalization No Has patient had a PCN reaction occurring within the last 10 years: Yes If all of the above answers are "NO", then may proceed with Cephalosporin use.   Can take  Cephalosporins   Prednisone Swelling    Just doesn't want to take it.    Family History  Problem Relation Age of Onset   Ovarian cancer Mother    Diabetes Mother    COPD Father    Heart disease Maternal Grandmother    Clotting disorder Other        Father, brother, sister, and daughter all had deep venous thrombosis   Breast cancer Other    Pancreatic cancer Paternal Uncle    Colon cancer Neg Hx      Prior to Admission medications   Medication Sig Start Date End Date Taking? Authorizing Provider  amLODipine (NORVASC) 2.5 MG tablet Take 1 tablet (2.5 mg total) by mouth daily. 01/22/20  Yes Plotnikov, Evie Lacks, MD  aspirin EC 81 MG tablet Take 81 mg by mouth daily. Swallow whole.   Yes [provider]  Fluticasone-Umeclidin-Vilant (TRELEGY ELLIPTA) 100-62.5-25 MCG/INH AEPB 1 inh daily 05/28/20  Yes Plotnikov, Evie Lacks, MD  oxyCODONE (ROXICODONE) 5 MG immediate release tablet Take 1 tablet (5 mg total) by mouth 3 (three) times daily as needed for severe pain. 11/21/20  Yes Plotnikov, Evie Lacks, MD  rOPINIRole (REQUIP) 4 MG tablet TAKE 1 TABLET BY MOUTH 3 TIMES A DAY 12/30/20  Yes Plotnikov, Evie Lacks, MD  benzonatate (TESSALON) 100 MG capsule Take 1-2 capsules (100-200 mg total) by mouth 3 (three) times daily  as needed for cough. Patient not taking: No sig reported 09/10/20   Plotnikov, Evie Lacks, MD  lidocaine (LIDODERM) 5 % Place 1-2 patches onto the skin daily. Remove & Discard patch within 12 hours or as directed by MD Patient not taking: No sig reported 08/25/19   Plotnikov, Evie Lacks, MD  pantoprazole (PROTONIX) 40 MG tablet TAKE 1 TABLET BY MOUTH EVERY DAY Patient not taking: No sig reported 11/21/20   Cassandria Anger, MD    Physical Exam: Vitals:   01/14/21 1745 01/14/21 1800 01/14/21 1815 01/14/21 1830  BP: (!) 111/55 (!) 109/57 108/84 (!) 116/57  Pulse: 71 89 79 71  Resp: '17 19 14 17  '$ Temp:      TempSrc:      SpO2: 100% 100% 99% 100%  Weight:      Height:        Constitutional: NAD, calm, comfortable Vitals:   01/14/21 1745 01/14/21 1800 01/14/21 1815 01/14/21 1830  BP: (!) 111/55 (!) 109/57 108/84 (!) 116/57  Pulse: 71 89 79 71  Resp: '17 19 14 17  '$ Temp:      TempSrc:      SpO2: 100% 100% 99% 100%  Weight:      Height:       Eyes: PERRL, lids and conjunctivae pale ENMT: Mucous membranes are moist. Posterior pharynx clear of any exudate or lesions.Normal dentition.  Neck: normal, supple, no masses, no thyromegaly Respiratory: clear to auscultation bilaterally, no wheezing, no crackles. Normal respiratory effort. No accessory muscle use.  Cardiovascular: Regular rate and rhythm, n loud mechanical murmur. No extremity edema. 2+ pedal pulses. No carotid bruits.  Abdomen: no tenderness, no masses palpated. No hepatosplenomegaly. Bowel sounds positive.  Musculoskeletal: no clubbing / cyanosis. No joint deformity upper and lower extremities. Good ROM, no contractures. Normal muscle tone.  Skin: no rashes, lesions, ulcers. No induration Neurologic: CN 2-12 grossly intact. Sensation intact, DTR normal. Strength 5/5 in all 4.  Psychiatric: Normal judgment and insight. Alert and oriented x 3. Normal mood.     Labs on Admission: I have personally reviewed  following labs and  imaging studies  CBC: Recent Labs  Lab 01/14/21 1357  WBC 3.2*  NEUTROABS 2.2  HGB 2.7*  HCT 9.9*  MCV 81.1  PLT 87*   Basic Metabolic Panel: Recent Labs  Lab 01/14/21 1357  NA 138  K 3.4*  CL 107  CO2 22  GLUCOSE 100*  BUN 32*  CREATININE 1.45*  CALCIUM 8.6*   GFR: Estimated Creatinine Clearance: 22.5 mL/min (A) (by C-G formula based on SCr of 1.45 mg/dL (H)). Liver Function Tests: No results for input(s): AST, ALT, ALKPHOS, BILITOT, PROT, ALBUMIN in the last 168 hours. No results for input(s): LIPASE, AMYLASE in the last 168 hours. No results for input(s): AMMONIA in the last 168 hours. Coagulation Profile: Recent Labs  Lab 01/14/21 1357  INR 1.3*   Cardiac Enzymes: No results for input(s): CKTOTAL, CKMB, CKMBINDEX, TROPONINI in the last 168 hours. BNP (last 3 results) No results for input(s): PROBNP in the last 8760 hours. HbA1C: No results for input(s): HGBA1C in the last 72 hours. CBG: No results for input(s): GLUCAP in the last 168 hours. Lipid Profile: No results for input(s): CHOL, HDL, LDLCALC, TRIG, CHOLHDL, LDLDIRECT in the last 72 hours. Thyroid Function Tests: No results for input(s): TSH, T4TOTAL, FREET4, T3FREE, THYROIDAB in the last 72 hours. Anemia Panel: Recent Labs    01/14/21 1728  RETICCTPCT 5.0*   Urine analysis:    Component Value Date/Time   COLORURINE YELLOW 08/26/2020 1355   APPEARANCEUR [place] (A) 08/26/2020 1355   LABSPEC 1.015 08/26/2020 1355   PHURINE 6.0 08/26/2020 1355   GLUCOSEU NEGATIVE 08/26/2020 1355   HGBUR TRACE-INTACT (A) 08/26/2020 1355   BILIRUBINUR NEGATIVE 08/26/2020 [place] 08/26/2020 1355   PROTEINUR NEGATIVE 06/24/2016 1256   UROBILINOGEN 0.2 08/26/2020 1355   NITRITE NEGATIVE 08/26/2020 1355   LEUKOCYTESUR TRACE (A) 08/26/2020 1355    Radiological Exams on Admission: DG Chest Port 1 View  Result Date: 01/14/2021 CLINICAL DATA:  sob EXAM: PORTABLE CHEST 1 VIEW COMPARISON:   August 26, 2020. FINDINGS: Patient rotation to the left. Streaky left basilar opacities, most likely atelectasis. No confluent consolidation. Possible small left pleural effusion versus pleural thickening. No visible pneumothorax. Similar cardiomediastinal silhouette. Aortic valve replacement and median sternotomy. Similar tortuosity of the aorta. Partially imaged left reverse shoulder arthroplasty. Thoracic dextrocurvature. Polyarticular degenerative change. IMPRESSION: 1. Streaky left basilar opacities, most likely atelectasis. 2. Possible small left pleural effusion versus pleural thickening. Electronically Signed   By: Margaretha Sheffield M.D.   On: 01/14/2021 14:36    EKG: Independently reviewed.  Sinus, no ST changes  Assessment/Plan Active Problems:   Anemia  (please populate well all problems here in Problem List. (For example, if patient is on BP meds at home and you resume or decide to hold them, it is a problem that needs to be her. Same for CAD, COPD, HLD and so on)  Symptomatic anemia -Suspect lower GI bleed.  Hemoglobin was 12 last year, 9.4 in April and 2.7 today.  Probably acute on chronic GI bleed.  History of diverticulosis, and chronic constipation.  Provoked factor, BUN/creatinine ratio <3 arguing against upper GI bleed.  Other differentials, patient did have multiple intra-abdominal aneurysm repair but most recent repair was in 2018, and CT angiogram 2 months ago showed stable aneurysm repair picture, making aorta-GI fistula unlikely.  However if H&H not stabilized after transfusion or patient develop symptoms signs of hypovolemia, should consider CT angiogram, and IR consultation.  However her  aorta anatomy is complicated with multiple repairs which put her at increased risk of other complications such as dissection, pseudoaneurysm.  I explained to patient daughter over the phone, who expressed understanding and agreed. -I also discussed with patient family regarding possible work-up  plan.  Daughter stated thoracic surgery's discussion in 2018 that patient's history of multiple aneurysm repair put her at increasing risk of further dissection, fast she is contraindicated for further surgeries such as left knee surgery. Which may applies to other procedures which requires general anesthesia such as EGD/colonoscopy. -Continue double dose PPI for now. -PRBC x3, recheck H&H afterwards, 1 dose of FFP, 1 dose of calcium.  Elevated troponins -Secondary to demanding ischemia, pattern is flat, not ACS.  History of multiple thoracic and abdominal AAA s/p repair -Has been stable since 2018, outpatient CT surgery follow-up. -Patient has a history of chronic borderline hypotension secondary to BP medication which intended to help to prevent dissection.  In this case, will resume metoprolol but hold amlodipine for today.  If her blood pressure recovers tomorrow after transfusion, will restart amlodipine.  Chronic ambulation dysfunction secondary to left knee severe OA -She has a brace chronically, will continue Requip for restless leg.  DVT prophylaxis: SCD Code Status: DNR Family Communication: Daughter over the phone Disposition Plan: Expect more than 2 midnight hospital stay, may need more transfusion. Consults called: GI Admission status: Tele admit   Lequita Halt MD [facility] Pager [phone]  01/14/2021, 7:24 PM

## 2021-01-14 NOTE — ED Provider Notes (Signed)
[facility] EMERGENCY DEPARTMENT Provider Note   CSN: [id number] Arrival date & time: 01/14/21  1330     History Chief Complaint  Patient presents with   Shortness of Breath    Diane Abbott is a 86 y.o. female.  She is brought in by EMS from home for increased shortness of breath.  EMS states her oxygen saturation was 80% on room air.  They placed her on nasal cannula.  She states her shortness of breath has been going on about 5 days although worse after the last 3 days.  Worse with any type of exertion.  Cough productive of some clear sputum.  No chest pain but does have some tightness across her ribs.  She said she had a history of blood clot in the past but is not on blood thinners now.  Also has history of aortic aneurysm.  Next complaint is she is had a few dark stools.  She said they smell bad.  Next she says she is got chronic left leg pain from a prior knee surgery.  She is in a brace.  She cannot put any weight on her leg and so was mostly in bed.  She said she has restless legs and is on medication for that.  She said she took too much ropinirole to help with that symptom.  The history is provided by the patient and the EMS personnel.  Shortness of Breath Severity:  Severe Onset quality:  Gradual Duration:  5 days Timing:  Constant Progression:  Worsening Chronicity:  New Relieved by:  Nothing Worsened by:  Activity Ineffective treatments:  Rest Associated symptoms: chest pain, cough and sputum production   Associated symptoms: no abdominal pain, no fever, no headaches, no hemoptysis, no rash, no sore throat and no vomiting   Chest pain:    Quality: tightness     Severity:  Mild   Onset quality:  Gradual   Timing:  Intermittent   Progression:  Unchanged Risk factors: hx of PE/DVT       Past Medical History:  Diagnosis Date   Adrenal insufficiency ([place])    Anemia    Anginal pain ([place])    "comes and goes has occured since 2012"   Blood  dyscrasia    "problems with platelets, red cells and white cells being low"   Bronchitis Jan 2013-March 2013   severe   Cancer [facility])    skin cancer on face   Cataracts, bilateral    more in left than right   COPD (chronic obstructive pulmonary disease) (HCC)    Depression    Esophagitis    Fatty liver    Fibromyalgia    Fibromyalgia    Foot pain, left 08/26/2016   2/18 1st MTP - gout vs other   GERD (gastroesophageal reflux disease)    Heart murmur    History of blood clots 2008   below knee post trauma. Dilated [facility] no clotting disorder felt to be present.   History of echocardiogram    Echo 11/16: Mild LVH, EF 60-65%, normal wall motion, grade 1 diastolic dysfunction, bioprosthetic AVR with mean gradient 21 mmHg and no regurgitation, proximal aortic arch aneurysm 49 mm, moderate TR, PASP 31 mmHg   Hypertension    IBS (irritable bowel syndrome)    Leaky heart valve    Osteoarthritis    Osteoarthritis of left shoulder 12/07/2011   Osteopenia    Pulmonary embolism (HCC)    RA (rheumatoid arthritis) ([place])  Shortness of breath    attributed to aneurysm   Sliding hiatal hernia    Urinary leakage    when coughing   Vertigo    hx of   Vitamin B 12 deficiency    Vitamin D deficiency     Patient Active Problem List   Diagnosis Date Noted   Failure to thrive in adult 10/23/2020   Atherosclerosis of aorta ([place]) 08/26/2020   Weight loss 08/26/2020   Ingrown toenail 05/28/2020   HTN (hypertension) 01/22/2020   Statin myopathy 01/22/2020   Urinary tract infection without hematuria 10/29/2019   Hair loss 04/11/2019   Forehead laceration 04/11/2019   Urinary urgency 04/11/2019   Contusion of face 03/15/2019   Hand laceration 10/20/2018   Abdominal pain 07/15/2018   History of left shoulder replacement 11/13/2016   History of total knee replacement, bilateral 11/13/2016   History of myeloproliferative disorder 09/17/2016   IBS (irritable bowel syndrome) 09/17/2016    Diskitis 08/29/2015   Acute osteomyelitis of lumbar spine ([place]) 07/29/2015   Thoracic compression fracture ([place]) 07/29/2015   Discitis of lumbosacral region    Epidural abscess    Chronic renal insufficiency, stage III (moderate) ([place]) 02/28/2015   Hammer toe, acquired 11/29/2014   Knee pain, left 07/25/2013   S/P ascending aortic replacement 10/04/2012   S/P AVR (aortic valve replacement) 10/04/2012   Aortic aneurysm, thoracic ([place]) 09/14/2012   Nausea 09/09/2011   COPD (chronic obstructive pulmonary disease) ([place]) 03/12/2011   Fever 07/01/2010   RESTLESS LEG SYNDROME 02/03/2010   Thrush of mouth and esophagus ([place]) 07/04/2009   History of pulmonary embolism 09/14/2008   BRONCHITIS, ACUTE 06/06/2008   Chest pain 07/13/2007   GERD 07/08/2007   Irritable bowel syndrome 07/08/2007   Chronic pain 07/08/2007   OSTEOARTHRITIS 03/10/2007   Osteopenia 03/10/2007   PULMONARY EMBOLISM, HX OF 03/10/2007   Rheumatoid arthritis ([place]) 11/23/2006    Past Surgical History:  Procedure Laterality Date   ABDOMINAL HYSTERECTOMY     AORTA SURGERY  12/2012   Resection of aneurysm and valve replacement   APPENDECTOMY  1956   BENTALL PROCEDURE N/A 09/29/2012   Procedure: BENTALL PROCEDURE;  Surgeon: Gaye Pollack, MD;  Location: [facility];  Service: Open Heart Surgery;  Laterality: N/A;  WITH CIRC ARREST   BILATERAL OOPHORECTOMY     BREAST BIOPSY Left    CARDIAC CATHETERIZATION  09/15/12   no PCI   CATARACT EXTRACTION Bilateral    CHOLECYSTECTOMY  2003   COLONOSCOPY     COSMETIC SURGERY     Tummy tuck   INTRAOPERATIVE TRANSESOPHAGEAL ECHOCARDIOGRAM N/A 09/29/2012   Procedure: INTRAOPERATIVE TRANSESOPHAGEAL ECHOCARDIOGRAM;  Surgeon: Gaye Pollack, MD;  Location: [facility];  Service: Open Heart Surgery;  Laterality: N/A;   JOINT REPLACEMENT Bilateral    REPLACEMENT ASCENDING AORTA N/A 06/29/2016   Procedure: AORTIC ARCH REPLACEMENT WITH CIRC ARREST (N/A) - RIGHT AXILLARY CANNULATION  AORTO-AORTIC 28  HEMASHIELD GRAFT WITH AORTO-INNOMINATE AND AORTO-LEFT CAROTID ANASTAMOSIS;  Surgeon: Gaye Pollack, MD;  Location: [facility];  Service: Open Heart Surgery;  Laterality: N/A;  RIGHT AXILLARY CANNULATION  BILATERAL RADIAL A-LINE   SKIN CANCER EXCISION Right    cheek   TEE WITHOUT CARDIOVERSION N/A 06/29/2016   Procedure: TRANSESOPHAGEAL ECHOCARDIOGRAM (TEE);  Surgeon: Gaye Pollack, MD;  Location: [facility];  Service: Open Heart Surgery;  Laterality: N/A;   TONSILLECTOMY  1941   TOTAL KNEE ARTHROPLASTY     bilateral   TOTAL SHOULDER ARTHROPLASTY  12/07/2011  Procedure: TOTAL SHOULDER ARTHROPLASTY;  Surgeon: Johnny Bridge, MD;  Location: [facility];  Service: Orthopedics;  Laterality: Left;  left total shoulder artthroplasty   WRIST SURGERY     bilateral     OB History   No obstetric history on file.     Family History  Problem Relation Age of Onset   Ovarian cancer Mother    Diabetes Mother    COPD Father    Heart disease Maternal Grandmother    Clotting disorder Other        Father, brother, sister, and daughter all had deep venous thrombosis   Breast cancer Other    Pancreatic cancer Paternal Uncle    Colon cancer Neg Hx     Social History   Tobacco Use   Smoking status: Never   Smokeless tobacco: Never   Tobacco comments:    She describes COPD related to secondhand exposure from her family and husband  Vaping Use   Vaping Use: Never used  Substance Use Topics   Alcohol use: Yes    Comment: occ    Drug use: Never    Home Medications Prior to Admission medications   Medication Sig Start Date End Date Taking? Authorizing Provider  amLODipine (NORVASC) 2.5 MG tablet Take 1 tablet (2.5 mg total) by mouth daily. 01/22/20   Plotnikov, Evie Lacks, MD  benzonatate (TESSALON) 100 MG capsule Take 1-2 capsules (100-200 mg total) by mouth 3 (three) times daily as needed for cough. 09/10/20   Plotnikov, Evie Lacks, MD  Fluticasone-Umeclidin-Vilant (TRELEGY ELLIPTA) 100-62.5-25 MCG/INH AEPB  1 inh daily 05/28/20   Plotnikov, Evie Lacks, MD  lidocaine (LIDODERM) 5 % Place 1-2 patches onto the skin daily. Remove & Discard patch within 12 hours or as directed by MD 08/25/19   Plotnikov, Evie Lacks, MD  oxyCODONE (ROXICODONE) 5 MG immediate release tablet Take 1 tablet (5 mg total) by mouth 3 (three) times daily as needed for severe pain. 11/21/20   Plotnikov, Evie Lacks, MD  pantoprazole (PROTONIX) 40 MG tablet TAKE 1 TABLET BY MOUTH EVERY DAY 11/21/20   Plotnikov, Evie Lacks, MD  rOPINIRole (REQUIP) 4 MG tablet TAKE 1 TABLET BY MOUTH 3 TIMES A DAY 12/30/20   Plotnikov, Evie Lacks, MD  torsemide (DEMADEX) 20 MG tablet Take 40 mg by mouth 2 (two) times daily as needed (Swelling).     [provider]    Allergies    Acetaminophen, Codeine, Pramipexole dihydrochloride, Rofecoxib, Venlafaxine, Arava [leflunomide], Clonazepam, Diclofenac sodium, Sulfa antibiotics, Alprazolam, Darvocet [propoxyphene n-acetaminophen], Duloxetine, Gabapentin, Latex, Morphine and related, Nortriptyline hcl, Penicillins, and Prednisone  Review of Systems   Review of Systems  Constitutional:  Negative for fever.  HENT:  Negative for sore throat.   Eyes:  Negative for visual disturbance.  Respiratory:  Positive for cough, sputum production and shortness of breath. Negative for hemoptysis.   Cardiovascular:  Positive for chest pain.  Gastrointestinal:  Positive for blood in stool (??). Negative for abdominal pain and vomiting.  Genitourinary:  Negative for dysuria.  Musculoskeletal:  Positive for gait problem.  Skin:  Negative for rash.  Neurological:  Negative for headaches.   Physical Exam Updated Vital Signs BP (!) 127/58   Pulse 83   Temp 98.1 F (36.7 C) (Oral)   Resp 17   Ht 5' (1.524 m)   Wt 54.9 kg   SpO2 100%   BMI 23.63 kg/m   Physical Exam Vitals and nursing note reviewed.  Constitutional:  General: She is not in acute distress.    Appearance: She is well-developed.  HENT:      Head: Normocephalic and atraumatic.  Eyes:     Conjunctiva/sclera: Conjunctivae normal.  Cardiovascular:     Rate and Rhythm: Normal rate and regular rhythm.     Heart sounds: Murmur heard.  Pulmonary:     Effort: Pulmonary effort is normal. No respiratory distress.     Breath sounds: Normal breath sounds.  Abdominal:     Palpations: Abdomen is soft.     Tenderness: There is no abdominal tenderness.  Musculoskeletal:     Cervical back: Neck supple.     Right lower leg: No tenderness.     Left lower leg: No tenderness.     Comments: Left leg is in a hinged knee brace.  Distal pulses motor and sensation intact  Skin:    General: Skin is warm and dry.  Neurological:     General: No focal deficit present.     Mental Status: She is alert.    ED Results / Procedures / Treatments   Labs (all labs ordered are listed, but only abnormal results are displayed) Labs Reviewed  BASIC METABOLIC PANEL - Abnormal; Notable for the following components:      Result Value   Potassium 3.4 (*)    Glucose, Bld 100 (*)    BUN 32 (*)    Creatinine, Ser 1.45 (*)    Calcium 8.6 (*)    GFR, Estimated 36 (*)    All other components within normal limits  CBC WITH DIFFERENTIAL/PLATELET - Abnormal; Notable for the following components:   WBC 3.2 (*)    RBC 1.22 (*)    Hemoglobin 2.7 (*)    HCT 9.9 (*)    MCH 22.1 (*)    MCHC 27.3 (*)    RDW 17.7 (*)    Platelets 87 (*)    nRBC 1.3 (*)    All other components within normal limits  BRAIN NATRIURETIC PEPTIDE - Abnormal; Notable for the following components:   B Natriuretic Peptide 1,183.4 (*)    All other components within normal limits  PROTIME-INR - Abnormal; Notable for the following components:   Prothrombin Time 16.2 (*)    INR 1.3 (*)    All other components within normal limits  D-DIMER, QUANTITATIVE - Abnormal; Notable for the following components:   D-Dimer, Quant 4.75 (*)    All other components within normal limits  RETICULOCYTES -  Abnormal; Notable for the following components:   Retic Ct Pct 5.0 (*)    RBC. 1.19 (*)    Immature Retic Fract 33.9 (*)    All other components within normal limits  TROPONIN I (HIGH SENSITIVITY) - Abnormal; Notable for the following components:   Troponin I (High Sensitivity) 45 (*)    All other components within normal limits  TROPONIN I (HIGH SENSITIVITY) - Abnormal; Notable for the following components:   Troponin I (High Sensitivity) 42 (*)    All other components within normal limits  RESP PANEL BY RT-PCR (FLU A&B, COVID) ARPGX2  HEMOGLOBIN AND HEMATOCRIT, BLOOD  IRON AND TIBC  FERRITIN  BASIC METABOLIC PANEL  CBC  POC OCCULT BLOOD, ED  TYPE AND SCREEN  PREPARE RBC (CROSSMATCH)    EKG EKG Interpretation  Date/Time:  Tuesday January 14 2021 13:48:31 EDT Ventricular Rate:  81 PR Interval:  205 QRS Duration: 93 QT Interval:  428 QTC Calculation: 497 R Axis:   86 Text Interpretation:  Sinus rhythm Borderline right axis deviation Abnormal T, consider ischemia, lateral leads No significant change since prior 6/20 Confirmed by Aletta Edouard [phone]) on 01/14/2021 2:04:52 PM  Radiology DG Chest Port 1 View  Result Date: 01/14/2021 CLINICAL DATA:  sob EXAM: PORTABLE CHEST 1 VIEW COMPARISON:  August 26, 2020. FINDINGS: Patient rotation to the left. Streaky left basilar opacities, most likely atelectasis. No confluent consolidation. Possible small left pleural effusion versus pleural thickening. No visible pneumothorax. Similar cardiomediastinal silhouette. Aortic valve replacement and median sternotomy. Similar tortuosity of the aorta. Partially imaged left reverse shoulder arthroplasty. Thoracic dextrocurvature. Polyarticular degenerative change. IMPRESSION: 1. Streaky left basilar opacities, most likely atelectasis. 2. Possible small left pleural effusion versus pleural thickening. Electronically Signed   By: Margaretha Sheffield M.D.   On: 01/14/2021 14:36    Procedures .Critical  Care Performed by: Hayden Rasmussen, MD Authorized by: Hayden Rasmussen, MD   Critical care provider statement:    Critical care time (minutes):  45   Critical care time was exclusive of:  Separately billable procedures and treating other patients   Critical care was necessary to treat or prevent imminent or life-threatening deterioration of the following conditions:  Circulatory failure and respiratory failure   Critical care was time spent personally by me on the following activities:  Discussions with consultants, evaluation of patient's response to treatment, examination of patient, ordering and performing treatments and interventions, ordering and review of laboratory studies, ordering and review of radiographic studies, pulse oximetry, re-evaluation of patient's condition, obtaining history from patient or surrogate, review of old charts and development of treatment plan with patient or surrogate   Medications Ordered in ED Medications  rOPINIRole (REQUIP) tablet 2 mg (has no administration in time range)  calcium gluconate 1 g/ 50 mL sodium chloride IVPB (has no administration in time range)  oxyCODONE (Oxy IR/ROXICODONE) immediate release tablet 5 mg (has no administration in time range)  furosemide (LASIX) injection 20 mg (has no administration in time range)  rOPINIRole (REQUIP) tablet 4 mg (has no administration in time range)  fluticasone furoate-vilanterol (BREO ELLIPTA) 100-25 MCG/INH 1 puff (has no administration in time range)  metoprolol tartrate (LOPRESSOR) tablet 12.5 mg (has no administration in time range)  pantoprazole (PROTONIX) injection 40 mg (has no administration in time range)  0.9 %  sodium chloride infusion (10 mL/hr Intravenous New Bag/Given 01/14/21 1657)  pantoprazole (PROTONIX) injection 40 mg (40 mg Intravenous Given 01/14/21 1700)    ED Course  I have reviewed the triage vital signs and the nursing notes.  Pertinent labs & imaging results that were  available during my care of the patient were reviewed by me and considered in my medical decision making (see chart for details).  Clinical Course as of 01/14/21 1908  Tue Jan 14, 2021  1626 Discussed with [facility] PA Trixie Deis.  She agrees with current management and will see tomorrow. [MB]  1 Discussed with Dr. Roosevelt Locks from [facility] hospitalist who will evaluate the patient for admission. [MB]    Clinical Course User Index [MB] Hayden Rasmussen, MD   MDM Rules/Calculators/A&P                          ARLOWE MARTINIE was evaluated in Emergency Department on 01/14/2021 for the symptoms described in the history of present illness. She was evaluated in the context of the global COVID-19 pandemic, which necessitated consideration that the patient might be at risk for infection  with the SARS-CoV-2 virus that causes COVID-19. Institutional protocols and algorithms that pertain to the evaluation of patients at risk for COVID-19 are in a state of rapid change based on information released by regulatory bodies including the CDC and federal and state organizations. These policies and algorithms were followed during the patient's care in the ED.  This patient complains of shortness of breath dyspnea on exertion, restless legs, dark stool; this involves an extensive number of treatment Options and is a complaint that carries with it a high risk of complications and Morbidity. The differential includes anemia, GI bleed, ACS, PE, dehydration, metabolic derangement, arrhythmia  I ordered, reviewed and interpreted labs, which included CBC with pancytopenia, low white count low platelets and critically low hemoglobin chemistries mildly low potassium mildly elevated creatinine, D-dimer markedly elevated, BNP elevated, troponin mildly elevated will need to be trended I ordered medication IV Protonix 3 units packed red blood cells I ordered imaging studies which included chest x-ray and I independently     visualized and interpreted imaging which showed atelectasis possible small effusion Additional history obtained from EMS Previous records obtained and reviewed in epic no recent admissions I consulted Dr. Roosevelt Locks [facility] hospitalist and discussed lab and imaging findings  Critical Interventions: Transfusion of packed red blood cells for patient symptomatic anemia.  Identification and work-up of patient's possible GI bleed  After the interventions stated above, I reevaluated the patient and found patient to be fairly medically stable and asymptomatic at rest.  She is agreeable to transfusion admission to the hospital.   Final Clinical Impression(s) / ED Diagnoses Final diagnoses:  Symptomatic anemia  Pancytopenia ([place])  Dyspnea on exertion    Rx / DC Orders ED Discharge Orders     None        Hayden Rasmussen, MD 01/14/21 1913

## 2021-01-14 NOTE — ED Triage Notes (Signed)
BIB EMS from home. Pt needing oxygen up to 3L. Usually on room air, bed bound. She was 80% on room air with little movement. Hxof COPD and stable AAA.

## 2021-01-14 NOTE — ED Notes (Addendum)
Pt tolerating blood transfusion. Keeping the rate at 131m/hour due to increase BNP. Pt remains alert and oriented x4. She can tolerate lying more flat a little better. No signs of adverse reaction to blood transfusion is observed.

## 2021-01-14 NOTE — H&P (View-Only) (Signed)
[facility] Consult: 4:27 PM 01/14/2021  LOS: 0 days    Referring Provider: Dr Christella Hartigan  Primary Care Physician:  Cassandria Anger, MD Primary Gastroenterologist:  Dr. Delfin Edis     Reason for Consultation:  Hgb 2.7   HPI: Diane Abbott is a 86 y.o. female.  Hx Rheumatoid arthritis.  Fibromyalgia.  Spinal compression fractures.  IBS, alternating constipation/diarrhea.  DM  2.   2014 Bentall procedure with replacement of ascending aorta/proximal aortic arch.  Subsequent 2018 replacement aneurysmal aortic arch with aorto-innominate/aorto-left carotid bypass.  Ongoing descending thoracic aortic aneurysm and dissection.  Remote bilateral knee replacements.  Left knee replacement is failing and unstable which makes it difficult for her to stand or walk, essentially wheelchair-bound.  Restless leg syndrome, symptoms persist despite using above prescribed amounts of Requip.  Patient has been told she should not have any surgical procedures because of the aneurysm/dissection. Home meds include full dose aspirin (at least 1 daily, sometimes 2x day), oxycodone.  She is not taking PPI/H2 blocker.  01/2010 EGD for bloating, dyspepsia.  Normal study. 01/2010 colonoscopy for abdominal pain, screening purposes, hx IBS.  Normal study to IC valve.  No polyps, no cancers, no diverticulosis. 03/2011 EGD.  For dysphagia, excessive salivation, history of GERD, history of thrush.  Dr. Delfin Edis.  Study was normal.  No evidence of Candida.  Carafate slurry added to PPI and scheduled for esophagram. 03/2011 barium esophagram: Small sliding HH.  Hypertrophied cricopharyngeus muscle.  No stricture, no mass.  12.5 mm barium tablet passed without difficulty. 11/06/2020 CTAP with angio: No significant change of 4.7 cm descending thoracic  aortic aneurysm/dissection.  Stable L1/L2 vertebral compression deformities.  Diverticulosis in distal colon.  Patient started on diuretics many months ago for lower extremity edema which has resolved.  Generally does not have shortness of breath.  Baseline bowel habits are going many days, sometimes a week or more without a bowel movement and then having 2 to 3 days of frequent loose, brown stools.   Last week patient had a black, soft stool.  Her daughter (retired Therapist, sports) saw the stool in the BS commode and said it smelled and looks of blood.  Since then the patient has not had any bowel movements whatsoever.  Since then appetite diminished but no nausea, vomiting.  Chronic, stable dysphagia to large solid foods and large pills no regurgitation.  She has noticed dizziness when she gets up onto the bedside commode but she thought this was coming from her overuse of Requip.  No syncope.  Reports weight lost over 10 months of 170# to 120s. Resented to ED by ambulance last night due to progressive dyspnea.  Heart rates in 70s to 80s at arrival.  Now in the 60s.  BP 120s/50s at arrival, currently low 100s/50s.  Initial oxygen sats 80% on room air.  Excellent sats on 2 L [place] oxygen. Hgb 2.7, was 9.4 in late April almost 4 months ago, 12.5 eight months ago.  MCV 81. Hgb needs collected this AM after 3 PRBC and 1  FFP.  + AKI w BUN/Creat 32/1.4, GFR in 40s.  BNP 1183.  Trops 45, 42. Port CXR: Left basilar opacities, likely atelectasis.  Possible small, left pleural effusion vs pleural thickening.    Past Medical History:  Diagnosis Date   Adrenal insufficiency ([place])    Anemia    Anginal pain ([place])    "comes and goes has occured since 2012"   Blood dyscrasia    "problems with platelets, red cells and white cells being low"   Bronchitis Jan 2013-March 2013   severe   Cancer [facility])    skin cancer on face   Cataracts, bilateral    more in left than right   COPD (chronic obstructive pulmonary disease) (HCC)     Depression    Esophagitis    Fatty liver    Fibromyalgia    Fibromyalgia    Foot pain, left 08/26/2016   2/18 1st MTP - gout vs other   GERD (gastroesophageal reflux disease)    Heart murmur    History of blood clots 2008   below knee post trauma. Dilated [facility] no clotting disorder felt to be present.   History of echocardiogram    Echo 11/16: Mild LVH, EF 60-65%, normal wall motion, grade 1 diastolic dysfunction, bioprosthetic AVR with mean gradient 21 mmHg and no regurgitation, proximal aortic arch aneurysm 49 mm, moderate TR, PASP 31 mmHg   Hypertension    IBS (irritable bowel syndrome)    Leaky heart valve    Osteoarthritis    Osteoarthritis of left shoulder 12/07/2011   Osteopenia    Pulmonary embolism (HCC)    RA (rheumatoid arthritis) (HCC)    Shortness of breath    attributed to aneurysm   Sliding hiatal hernia    Urinary leakage    when coughing   Vertigo    hx of   Vitamin B 12 deficiency    Vitamin D deficiency     Past Surgical History:  Procedure Laterality Date   ABDOMINAL HYSTERECTOMY     AORTA SURGERY  12/2012   Resection of aneurysm and valve replacement   APPENDECTOMY  1956   BENTALL PROCEDURE N/A 09/29/2012   Procedure: BENTALL PROCEDURE;  Surgeon: Gaye Pollack, MD;  Location: [facility];  Service: Open Heart Surgery;  Laterality: N/A;  WITH CIRC ARREST   BILATERAL OOPHORECTOMY     BREAST BIOPSY Left    CARDIAC CATHETERIZATION  09/15/12   no PCI   CATARACT EXTRACTION Bilateral    CHOLECYSTECTOMY  2003   COLONOSCOPY     COSMETIC SURGERY     Tummy tuck   INTRAOPERATIVE TRANSESOPHAGEAL ECHOCARDIOGRAM N/A 09/29/2012   Procedure: INTRAOPERATIVE TRANSESOPHAGEAL ECHOCARDIOGRAM;  Surgeon: Gaye Pollack, MD;  Location: [facility];  Service: Open Heart Surgery;  Laterality: N/A;   JOINT REPLACEMENT Bilateral    REPLACEMENT ASCENDING AORTA N/A 06/29/2016   Procedure: AORTIC ARCH REPLACEMENT WITH CIRC ARREST (N/A) - RIGHT AXILLARY CANNULATION  AORTO-AORTIC 28  HEMASHIELD GRAFT WITH AORTO-INNOMINATE AND AORTO-LEFT CAROTID ANASTAMOSIS;  Surgeon: Gaye Pollack, MD;  Location: [facility];  Service: Open Heart Surgery;  Laterality: N/A;  RIGHT AXILLARY CANNULATION  BILATERAL RADIAL A-LINE   SKIN CANCER EXCISION Right    cheek   TEE WITHOUT CARDIOVERSION N/A 06/29/2016   Procedure: TRANSESOPHAGEAL ECHOCARDIOGRAM (TEE);  Surgeon: Gaye Pollack, MD;  Location: [facility];  Service: Open Heart Surgery;  Laterality: N/A;   TONSILLECTOMY  1941   TOTAL KNEE ARTHROPLASTY     bilateral  TOTAL SHOULDER ARTHROPLASTY  12/07/2011   Procedure: TOTAL SHOULDER ARTHROPLASTY;  Surgeon: Johnny Bridge, MD;  Location: [facility];  Service: Orthopedics;  Laterality: Left;  left total shoulder artthroplasty   WRIST SURGERY     bilateral    Prior to Admission medications   Medication Sig Start Date End Date Taking? Authorizing Provider  amLODipine (NORVASC) 2.5 MG tablet Take 1 tablet (2.5 mg total) by mouth daily. 01/22/20   Plotnikov, Evie Lacks, MD  benzonatate (TESSALON) 100 MG capsule Take 1-2 capsules (100-200 mg total) by mouth 3 (three) times daily as needed for cough. 09/10/20   Plotnikov, Evie Lacks, MD  Fluticasone-Umeclidin-Vilant (TRELEGY ELLIPTA) 100-62.5-25 MCG/INH AEPB 1 inh daily 05/28/20   Plotnikov, Evie Lacks, MD  lidocaine (LIDODERM) 5 % Place 1-2 patches onto the skin daily. Remove & Discard patch within 12 hours or as directed by MD 08/25/19   Plotnikov, Evie Lacks, MD  oxyCODONE (ROXICODONE) 5 MG immediate release tablet Take 1 tablet (5 mg total) by mouth 3 (three) times daily as needed for severe pain. 11/21/20   Plotnikov, Evie Lacks, MD  pantoprazole (PROTONIX) 40 MG tablet TAKE 1 TABLET BY MOUTH EVERY DAY 11/21/20   Plotnikov, Evie Lacks, MD  rOPINIRole (REQUIP) 4 MG tablet TAKE 1 TABLET BY MOUTH 3 TIMES A DAY 12/30/20   Plotnikov, Evie Lacks, MD  torsemide (DEMADEX) 20 MG tablet Take 40 mg by mouth 2 (two) times daily as needed (Swelling).     [provider]     Scheduled Meds:  pantoprazole (PROTONIX) IV  40 mg Intravenous Once   rOPINIRole  2 mg Oral Once   Infusions:  sodium chloride     PRN Meds:    Allergies as of 01/14/2021 - Review Complete 01/14/2021  Allergen Reaction Noted   Acetaminophen  08/06/2015   Codeine Other (See Comments) 09/08/2010   Pramipexole dihydrochloride Nausea Only 02/13/2008   Rofecoxib Other (See Comments)    Venlafaxine Other (See Comments)    Arava [leflunomide] Swelling 05/25/2012   Clonazepam  11/23/2006   Diclofenac sodium  09/08/2010   Sulfa antibiotics  01/22/2020   Alprazolam Other (See Comments)    Darvocet [propoxyphene n-acetaminophen] Other (See Comments) 09/08/2010   Duloxetine Other (See Comments) 02/13/2008   Gabapentin Other (See Comments)    Latex Itching and Dermatitis 12/07/2011   Morphine and related Other (See Comments) 09/08/2010   Nortriptyline hcl Other (See Comments) 04/04/2008   Penicillins Rash 05/31/2007   Prednisone Swelling     Family History  Problem Relation Age of Onset   Ovarian cancer Mother    Diabetes Mother    COPD Father    Heart disease Maternal Grandmother    Clotting disorder Other        Father, brother, sister, and daughter all had deep venous thrombosis   Breast cancer Other    Pancreatic cancer Paternal Uncle    Colon cancer Neg Hx     Social History   Socioeconomic History   Marital status: Divorced    Spouse name: Not on file   Number of children: Not on file   Years of education: Not on file   Highest education level: Not on file  Occupational History   Occupation: retired    Fish farm manager: RETIRED  Tobacco Use   Smoking status: Never   Smokeless tobacco: Never   Tobacco comments:    She describes COPD related to secondhand exposure from her family and husband  Vaping Use   Vaping Use:  Never used  Substance and Sexual Activity   Alcohol use: Not Currently    Comment: occ    Drug use: Never   Sexual activity: Not on file  Other  Topics Concern   Not on file  Social History Narrative   Not on file   Social Determinants of Health   Financial Resource Strain: Not on file  Food Insecurity: Not on file  Transportation Needs: Not on file  Physical Activity: Not on file  Stress: Not on file  Social Connections: Not on file  Intimate Partner Violence: Not on file    REVIEW OF SYSTEMS: Constitutional: Weakness but no profound fatigue ENT:  No nose bleeds.  Past several days has had a scaling rash in the region of her right thigh. Pulm: Progressive dyspnea with minor effort. CV:  No palpitations, no LE edema.  No angina. GU:  No hematuria, no frequency GI: See HPI. Heme: Denies excessive or unusual bleeding or bruising. Transfusions: No prior transfusions before the last 24 hours. Neuro: Dizziness without syncope.  Restless leg activity hinders her sleep and contributes to poor quality of life.  No seizures.  No headaches, no peripheral tingling or numbness Derm:  No itching, no rash or sores.  Endocrine:  No sweats or chills.  No polyuria or dysuria Immunization: Reviewed. Travel:  None beyond local counties in last few months.    PHYSICAL EXAM: Vital signs in last 24 hours: Vitals:   01/14/21 1350 01/14/21 1351  BP:  (!) 127/58  Pulse:  83  Resp:  17  Temp:  98.1 F (36.7 C)  SpO2: 100% 100%   Wt Readings from Last 3 Encounters:  01/14/21 54.9 kg  12/09/20 54.9 kg  11/21/20 55 kg    General: Pleasant.  Moderately frail appearing.  Comfortable.  No distress. Head: No facial asymmetry or swelling.  No signs of head trauma. Eyes: Conjunctiva pink.  No scleral icterus. Ears: Not hard of hearing Nose: No congestion or discharge Mouth: Edentulous.  Mucosa is moist, pink, clear.  Tongue midline. Neck:  + JVD.  No thyromegaly.  Radiation of heart murmur auscultated. Lungs: Some fine rales in the bases.  No cough.  No dyspnea at rest or with speech. Heart: RRR.  Harsh early systolic murmur.  S1, S2  audible. Abdomen: Soft.  Not tender.  No HSM, masses, bruits, hernias..   Rectal: Deferred.  Stool submitted to lab yesterday was FOBT negative. Musc/Skeltl: Some postoperative deformity of both knees but no erythema or swelling. Extremities: No CCE. Neurologic: Alert.  Appropriate.  Oriented x3.  Moves all 4 limbs without obvious weakness but strength not formally tested.  No observed restless leg activity during interview. Skin: No telangiectasia.  Slight scaly rash in region of medial, inferior eye. Tattoos: None observed Nodes: No cervical adenopathy Psych: Calm, pleasant, cooperative.  Fluid speech.  Intake/Output from previous day: No intake/output data recorded. Intake/Output this shift: No intake/output data recorded.  LAB RESULTS: Recent Labs    01/14/21 1357  WBC 3.2*  HGB 2.7*  HCT 9.9*  PLT 87*   BMET Lab Results  Component Value Date   NA 138 01/14/2021   NA 142 08/26/2020   NA 141 01/22/2020   K 3.4 (L) 01/14/2021   K 3.5 08/26/2020   K 5.5 (H) 01/22/2020   CL 107 01/14/2021   CL 103 08/26/2020   CL 104 01/22/2020   CO2 22 01/14/2021   CO2 27 08/26/2020   CO2 27 01/22/2020  GLUCOSE 100 (H) 01/14/2021   GLUCOSE 97 08/26/2020   GLUCOSE 108 (H) 01/22/2020   BUN 32 (H) 01/14/2021   BUN 23 08/26/2020   BUN 24 01/22/2020   CREATININE 1.45 (H) 01/14/2021   CREATININE 1.27 (H) 08/26/2020   CREATININE 1.26 (H) 01/22/2020   CALCIUM 8.6 (L) 01/14/2021   CALCIUM 10.0 08/26/2020   CALCIUM 9.9 01/22/2020   LFT No results for input(s): PROT, ALBUMIN, AST, ALT, ALKPHOS, BILITOT, BILIDIR, IBILI in the last 72 hours. PT/INR Lab Results  Component Value Date   INR 1.3 (H) 01/14/2021   INR 1.48 06/29/2016   INR 1.03 06/24/2016   PROTIME 10.8 02/17/2011   PROTIME Sent out for confirmation. 11/11/2006   PROTIME 50.4 (H) 11/04/2006   Hepatitis Panel No results for input(s): HEPBSAG, HCVAB, HEPAIGM, HEPBIGM in the last 72 hours. C-Diff No components found  for: CDIFF Lipase     Component Value Date/Time   LIPASE 118.0 (H) 11/24/2018 [id number]    Drugs of Abuse  No results found for: LABOPIA, COCAINSCRNUR, LABBENZ, AMPHETMU, THCU, LABBARB   RADIOLOGY STUDIES: DG Chest Port 1 View  Result Date: 01/14/2021 CLINICAL DATA:  sob EXAM: PORTABLE CHEST 1 VIEW COMPARISON:  August 26, 2020. FINDINGS: Patient rotation to the left. Streaky left basilar opacities, most likely atelectasis. No confluent consolidation. Possible small left pleural effusion versus pleural thickening. No visible pneumothorax. Similar cardiomediastinal silhouette. Aortic valve replacement and median sternotomy. Similar tortuosity of the aorta. Partially imaged left reverse shoulder arthroplasty. Thoracic dextrocurvature. Polyarticular degenerative change. IMPRESSION: 1. Streaky left basilar opacities, most likely atelectasis. 2. Possible small left pleural effusion versus pleural thickening. Electronically Signed   By: Margaretha Sheffield M.D.   On: 01/14/2021 14:36      IMPRESSION:      Profound anemia.  Symptomatic but not as symptomatic as one would expect given a Hgb of 2.7.  Received 3 PRBCs.  Repeat CBC in process along with iron/anemia studies.  Given that the iron studies were not collected until post transfusion, may not be reliable for assessment of IDA.  No stool was FOBT negative yesterday, pt reports a melenic stool about a week ago and no stools thereafter.  Rule out ulcer disease, rule out AVMs, rule out neoplasia.     Solid dysphagia.  Hx of same.  No esophageal stricture on EGD 2012 but esophagram then showed hypertrophied cricopharyngeus muscle.    Elevated BNP and mild elevation Trops.  No angina.  + increased DOE.  Current CXR showing left atelectasis and possible small left pleural effusion.  Last echocardiogram was 12/2018, showed LVEF 60 to [id number], no diastolic dysfunction.  No significant valvular disease.    Hypokalemia.       Extensive surgery for aortic aneurysm,  2014 and 2018.  Has ongoing descending thoracic aortic aneurysm, dissection.  Followed by Dr.Bartle.  Apparently has no surgical options at this point and has been cautioned to avoid surgeries/procedures.     Failing L TKR (remote replacement).  Due to the joint being unstable and painful, she is essentially wheelchair-bound.     Restless leg syndrome with symptoms persistent despite using Requip and doses exceeding prescribed amounts.    PLAN:     Would like to pursue EGD.  However need vascular surgery input to clarify if okay to pursue EGD.  Left msg w Dr Vivi Martens office nurse.    Awaiting follow-up Hgb     Addendum just a few minutes after finishing the note:   Dr. Cyndia Bent  reviewed the case.  His nurse has gotten back to me with his opinion which is that it is okay to proceed with upper endoscopy.  What he has told her in past is that she is not a candidate for any further aneurysm surgery.  Hgb back and measuring 6.8.  Have ordered 1 more PRBC.  Diane Abbott  01/14/2021, 4:27 PM Phone [phone]

## 2021-01-14 NOTE — Consult Note (Addendum)
[facility] Consult: 4:27 PM 01/14/2021  LOS: 0 days    Referring Provider: Dr Christella Hartigan  Primary Care Physician:  Cassandria Anger, MD Primary Gastroenterologist:  Dr. Delfin Edis     Reason for Consultation:  Hgb 2.7   HPI: Diane Abbott is a 86 y.o. female.  Hx Rheumatoid arthritis.  Fibromyalgia.  Spinal compression fractures.  IBS, alternating constipation/diarrhea.  DM  2.   2014 Bentall procedure with replacement of ascending aorta/proximal aortic arch.  Subsequent 2018 replacement aneurysmal aortic arch with aorto-innominate/aorto-left carotid bypass.  Ongoing descending thoracic aortic aneurysm and dissection.  Remote bilateral knee replacements.  Left knee replacement is failing and unstable which makes it difficult for her to stand or walk, essentially wheelchair-bound.  Restless leg syndrome, symptoms persist despite using above prescribed amounts of Requip.  Patient has been told she should not have any surgical procedures because of the aneurysm/dissection. Home meds include full dose aspirin (at least 1 daily, sometimes 2x day), oxycodone.  She is not taking PPI/H2 blocker.  01/2010 EGD for bloating, dyspepsia.  Normal study. 01/2010 colonoscopy for abdominal pain, screening purposes, hx IBS.  Normal study to IC valve.  No polyps, no cancers, no diverticulosis. 03/2011 EGD.  For dysphagia, excessive salivation, history of GERD, history of thrush.  Dr. Delfin Edis.  Study was normal.  No evidence of Candida.  Carafate slurry added to PPI and scheduled for esophagram. 03/2011 barium esophagram: Small sliding HH.  Hypertrophied cricopharyngeus muscle.  No stricture, no mass.  12.5 mm barium tablet passed without difficulty. 11/06/2020 CTAP with angio: No significant change of 4.7 cm descending thoracic  aortic aneurysm/dissection.  Stable L1/L2 vertebral compression deformities.  Diverticulosis in distal colon.  Patient started on diuretics many months ago for lower extremity edema which has resolved.  Generally does not have shortness of breath.  Baseline bowel habits are going many days, sometimes a week or more without a bowel movement and then having 2 to 3 days of frequent loose, brown stools.   Last week patient had a black, soft stool.  Her daughter (retired Therapist, sports) saw the stool in the BS commode and said it smelled and looks of blood.  Since then the patient has not had any bowel movements whatsoever.  Since then appetite diminished but no nausea, vomiting.  Chronic, stable dysphagia to large solid foods and large pills no regurgitation.  She has noticed dizziness when she gets up onto the bedside commode but she thought this was coming from her overuse of Requip.  No syncope.  Reports weight lost over 10 months of 170# to 120s. Resented to ED by ambulance last night due to progressive dyspnea.  Heart rates in 70s to 80s at arrival.  Now in the 60s.  BP 120s/50s at arrival, currently low 100s/50s.  Initial oxygen sats 80% on room air.  Excellent sats on 2 L [place] oxygen. Hgb 2.7, was 9.4 in late April almost 4 months ago, 12.5 eight months ago.  MCV 81. Hgb needs collected this AM after 3 PRBC and 1  FFP.  + AKI w BUN/Creat 32/1.4, GFR in 40s.  BNP 1183.  Trops 45, 42. Port CXR: Left basilar opacities, likely atelectasis.  Possible small, left pleural effusion vs pleural thickening.    Past Medical History:  Diagnosis Date   Adrenal insufficiency ([place])    Anemia    Anginal pain ([place])    "comes and goes has occured since 2012"   Blood dyscrasia    "problems with platelets, red cells and white cells being low"   Bronchitis Jan 2013-March 2013   severe   Cancer [facility])    skin cancer on face   Cataracts, bilateral    more in left than right   COPD (chronic obstructive pulmonary disease) (HCC)     Depression    Esophagitis    Fatty liver    Fibromyalgia    Fibromyalgia    Foot pain, left 08/26/2016   2/18 1st MTP - gout vs other   GERD (gastroesophageal reflux disease)    Heart murmur    History of blood clots 2008   below knee post trauma. Dilated [facility] no clotting disorder felt to be present.   History of echocardiogram    Echo 11/16: Mild LVH, EF 60-65%, normal wall motion, grade 1 diastolic dysfunction, bioprosthetic AVR with mean gradient 21 mmHg and no regurgitation, proximal aortic arch aneurysm 49 mm, moderate TR, PASP 31 mmHg   Hypertension    IBS (irritable bowel syndrome)    Leaky heart valve    Osteoarthritis    Osteoarthritis of left shoulder 12/07/2011   Osteopenia    Pulmonary embolism (HCC)    RA (rheumatoid arthritis) (HCC)    Shortness of breath    attributed to aneurysm   Sliding hiatal hernia    Urinary leakage    when coughing   Vertigo    hx of   Vitamin B 12 deficiency    Vitamin D deficiency     Past Surgical History:  Procedure Laterality Date   ABDOMINAL HYSTERECTOMY     AORTA SURGERY  12/2012   Resection of aneurysm and valve replacement   APPENDECTOMY  1956   BENTALL PROCEDURE N/A 09/29/2012   Procedure: BENTALL PROCEDURE;  Surgeon: Gaye Pollack, MD;  Location: [facility];  Service: Open Heart Surgery;  Laterality: N/A;  WITH CIRC ARREST   BILATERAL OOPHORECTOMY     BREAST BIOPSY Left    CARDIAC CATHETERIZATION  09/15/12   no PCI   CATARACT EXTRACTION Bilateral    CHOLECYSTECTOMY  2003   COLONOSCOPY     COSMETIC SURGERY     Tummy tuck   INTRAOPERATIVE TRANSESOPHAGEAL ECHOCARDIOGRAM N/A 09/29/2012   Procedure: INTRAOPERATIVE TRANSESOPHAGEAL ECHOCARDIOGRAM;  Surgeon: Gaye Pollack, MD;  Location: [facility];  Service: Open Heart Surgery;  Laterality: N/A;   JOINT REPLACEMENT Bilateral    REPLACEMENT ASCENDING AORTA N/A 06/29/2016   Procedure: AORTIC ARCH REPLACEMENT WITH CIRC ARREST (N/A) - RIGHT AXILLARY CANNULATION  AORTO-AORTIC 28  HEMASHIELD GRAFT WITH AORTO-INNOMINATE AND AORTO-LEFT CAROTID ANASTAMOSIS;  Surgeon: Gaye Pollack, MD;  Location: [facility];  Service: Open Heart Surgery;  Laterality: N/A;  RIGHT AXILLARY CANNULATION  BILATERAL RADIAL A-LINE   SKIN CANCER EXCISION Right    cheek   TEE WITHOUT CARDIOVERSION N/A 06/29/2016   Procedure: TRANSESOPHAGEAL ECHOCARDIOGRAM (TEE);  Surgeon: Gaye Pollack, MD;  Location: [facility];  Service: Open Heart Surgery;  Laterality: N/A;   TONSILLECTOMY  1941   TOTAL KNEE ARTHROPLASTY     bilateral  TOTAL SHOULDER ARTHROPLASTY  12/07/2011   Procedure: TOTAL SHOULDER ARTHROPLASTY;  Surgeon: Johnny Bridge, MD;  Location: [facility];  Service: Orthopedics;  Laterality: Left;  left total shoulder artthroplasty   WRIST SURGERY     bilateral    Prior to Admission medications   Medication Sig Start Date End Date Taking? Authorizing Provider  amLODipine (NORVASC) 2.5 MG tablet Take 1 tablet (2.5 mg total) by mouth daily. 01/22/20   Plotnikov, Evie Lacks, MD  benzonatate (TESSALON) 100 MG capsule Take 1-2 capsules (100-200 mg total) by mouth 3 (three) times daily as needed for cough. 09/10/20   Plotnikov, Evie Lacks, MD  Fluticasone-Umeclidin-Vilant (TRELEGY ELLIPTA) 100-62.5-25 MCG/INH AEPB 1 inh daily 05/28/20   Plotnikov, Evie Lacks, MD  lidocaine (LIDODERM) 5 % Place 1-2 patches onto the skin daily. Remove & Discard patch within 12 hours or as directed by MD 08/25/19   Plotnikov, Evie Lacks, MD  oxyCODONE (ROXICODONE) 5 MG immediate release tablet Take 1 tablet (5 mg total) by mouth 3 (three) times daily as needed for severe pain. 11/21/20   Plotnikov, Evie Lacks, MD  pantoprazole (PROTONIX) 40 MG tablet TAKE 1 TABLET BY MOUTH EVERY DAY 11/21/20   Plotnikov, Evie Lacks, MD  rOPINIRole (REQUIP) 4 MG tablet TAKE 1 TABLET BY MOUTH 3 TIMES A DAY 12/30/20   Plotnikov, Evie Lacks, MD  torsemide (DEMADEX) 20 MG tablet Take 40 mg by mouth 2 (two) times daily as needed (Swelling).     [provider]     Scheduled Meds:  pantoprazole (PROTONIX) IV  40 mg Intravenous Once   rOPINIRole  2 mg Oral Once   Infusions:  sodium chloride     PRN Meds:    Allergies as of 01/14/2021 - Review Complete 01/14/2021  Allergen Reaction Noted   Acetaminophen  08/06/2015   Codeine Other (See Comments) 09/08/2010   Pramipexole dihydrochloride Nausea Only 02/13/2008   Rofecoxib Other (See Comments)    Venlafaxine Other (See Comments)    Arava [leflunomide] Swelling 05/25/2012   Clonazepam  11/23/2006   Diclofenac sodium  09/08/2010   Sulfa antibiotics  01/22/2020   Alprazolam Other (See Comments)    Darvocet [propoxyphene n-acetaminophen] Other (See Comments) 09/08/2010   Duloxetine Other (See Comments) 02/13/2008   Gabapentin Other (See Comments)    Latex Itching and Dermatitis 12/07/2011   Morphine and related Other (See Comments) 09/08/2010   Nortriptyline hcl Other (See Comments) 04/04/2008   Penicillins Rash 05/31/2007   Prednisone Swelling     Family History  Problem Relation Age of Onset   Ovarian cancer Mother    Diabetes Mother    COPD Father    Heart disease Maternal Grandmother    Clotting disorder Other        Father, brother, sister, and daughter all had deep venous thrombosis   Breast cancer Other    Pancreatic cancer Paternal Uncle    Colon cancer Neg Hx     Social History   Socioeconomic History   Marital status: Divorced    Spouse name: Not on file   Number of children: Not on file   Years of education: Not on file   Highest education level: Not on file  Occupational History   Occupation: retired    Fish farm manager: RETIRED  Tobacco Use   Smoking status: Never   Smokeless tobacco: Never   Tobacco comments:    She describes COPD related to secondhand exposure from her family and husband  Vaping Use   Vaping Use:  Never used  Substance and Sexual Activity   Alcohol use: Not Currently    Comment: occ    Drug use: Never   Sexual activity: Not on file  Other  Topics Concern   Not on file  Social History Narrative   Not on file   Social Determinants of Health   Financial Resource Strain: Not on file  Food Insecurity: Not on file  Transportation Needs: Not on file  Physical Activity: Not on file  Stress: Not on file  Social Connections: Not on file  Intimate Partner Violence: Not on file    REVIEW OF SYSTEMS: Constitutional: Weakness but no profound fatigue ENT:  No nose bleeds.  Past several days has had a scaling rash in the region of her right thigh. Pulm: Progressive dyspnea with minor effort. CV:  No palpitations, no LE edema.  No angina. GU:  No hematuria, no frequency GI: See HPI. Heme: Denies excessive or unusual bleeding or bruising. Transfusions: No prior transfusions before the last 24 hours. Neuro: Dizziness without syncope.  Restless leg activity hinders her sleep and contributes to poor quality of life.  No seizures.  No headaches, no peripheral tingling or numbness Derm:  No itching, no rash or sores.  Endocrine:  No sweats or chills.  No polyuria or dysuria Immunization: Reviewed. Travel:  None beyond local counties in last few months.    PHYSICAL EXAM: Vital signs in last 24 hours: Vitals:   01/14/21 1350 01/14/21 1351  BP:  (!) 127/58  Pulse:  83  Resp:  17  Temp:  98.1 F (36.7 C)  SpO2: 100% 100%   Wt Readings from Last 3 Encounters:  01/14/21 54.9 kg  12/09/20 54.9 kg  11/21/20 55 kg    General: Pleasant.  Moderately frail appearing.  Comfortable.  No distress. Head: No facial asymmetry or swelling.  No signs of head trauma. Eyes: Conjunctiva pink.  No scleral icterus. Ears: Not hard of hearing Nose: No congestion or discharge Mouth: Edentulous.  Mucosa is moist, pink, clear.  Tongue midline. Neck:  + JVD.  No thyromegaly.  Radiation of heart murmur auscultated. Lungs: Some fine rales in the bases.  No cough.  No dyspnea at rest or with speech. Heart: RRR.  Harsh early systolic murmur.  S1, S2  audible. Abdomen: Soft.  Not tender.  No HSM, masses, bruits, hernias..   Rectal: Deferred.  Stool submitted to lab yesterday was FOBT negative. Musc/Skeltl: Some postoperative deformity of both knees but no erythema or swelling. Extremities: No CCE. Neurologic: Alert.  Appropriate.  Oriented x3.  Moves all 4 limbs without obvious weakness but strength not formally tested.  No observed restless leg activity during interview. Skin: No telangiectasia.  Slight scaly rash in region of medial, inferior eye. Tattoos: None observed Nodes: No cervical adenopathy Psych: Calm, pleasant, cooperative.  Fluid speech.  Intake/Output from previous day: No intake/output data recorded. Intake/Output this shift: No intake/output data recorded.  LAB RESULTS: Recent Labs    01/14/21 1357  WBC 3.2*  HGB 2.7*  HCT 9.9*  PLT 87*   BMET Lab Results  Component Value Date   NA 138 01/14/2021   NA 142 08/26/2020   NA 141 01/22/2020   K 3.4 (L) 01/14/2021   K 3.5 08/26/2020   K 5.5 (H) 01/22/2020   CL 107 01/14/2021   CL 103 08/26/2020   CL 104 01/22/2020   CO2 22 01/14/2021   CO2 27 08/26/2020   CO2 27 01/22/2020  GLUCOSE 100 (H) 01/14/2021   GLUCOSE 97 08/26/2020   GLUCOSE 108 (H) 01/22/2020   BUN 32 (H) 01/14/2021   BUN 23 08/26/2020   BUN 24 01/22/2020   CREATININE 1.45 (H) 01/14/2021   CREATININE 1.27 (H) 08/26/2020   CREATININE 1.26 (H) 01/22/2020   CALCIUM 8.6 (L) 01/14/2021   CALCIUM 10.0 08/26/2020   CALCIUM 9.9 01/22/2020   LFT No results for input(s): PROT, ALBUMIN, AST, ALT, ALKPHOS, BILITOT, BILIDIR, IBILI in the last 72 hours. PT/INR Lab Results  Component Value Date   INR 1.3 (H) 01/14/2021   INR 1.48 06/29/2016   INR 1.03 06/24/2016   PROTIME 10.8 02/17/2011   PROTIME Sent out for confirmation. 11/11/2006   PROTIME 50.4 (H) 11/04/2006   Hepatitis Panel No results for input(s): HEPBSAG, HCVAB, HEPAIGM, HEPBIGM in the last 72 hours. C-Diff No components found  for: CDIFF Lipase     Component Value Date/Time   LIPASE 118.0 (H) 11/24/2018 [id number]    Drugs of Abuse  No results found for: LABOPIA, COCAINSCRNUR, LABBENZ, AMPHETMU, THCU, LABBARB   RADIOLOGY STUDIES: DG Chest Port 1 View  Result Date: 01/14/2021 CLINICAL DATA:  sob EXAM: PORTABLE CHEST 1 VIEW COMPARISON:  August 26, 2020. FINDINGS: Patient rotation to the left. Streaky left basilar opacities, most likely atelectasis. No confluent consolidation. Possible small left pleural effusion versus pleural thickening. No visible pneumothorax. Similar cardiomediastinal silhouette. Aortic valve replacement and median sternotomy. Similar tortuosity of the aorta. Partially imaged left reverse shoulder arthroplasty. Thoracic dextrocurvature. Polyarticular degenerative change. IMPRESSION: 1. Streaky left basilar opacities, most likely atelectasis. 2. Possible small left pleural effusion versus pleural thickening. Electronically Signed   By: Margaretha Sheffield M.D.   On: 01/14/2021 14:36      IMPRESSION:      Profound anemia.  Symptomatic but not as symptomatic as one would expect given a Hgb of 2.7.  Received 3 PRBCs.  Repeat CBC in process along with iron/anemia studies.  Given that the iron studies were not collected until post transfusion, may not be reliable for assessment of IDA.  No stool was FOBT negative yesterday, pt reports a melenic stool about a week ago and no stools thereafter.  Rule out ulcer disease, rule out AVMs, rule out neoplasia.     Solid dysphagia.  Hx of same.  No esophageal stricture on EGD 2012 but esophagram then showed hypertrophied cricopharyngeus muscle.    Elevated BNP and mild elevation Trops.  No angina.  + increased DOE.  Current CXR showing left atelectasis and possible small left pleural effusion.  Last echocardiogram was 12/2018, showed LVEF 60 to [id number], no diastolic dysfunction.  No significant valvular disease.    Hypokalemia.       Extensive surgery for aortic aneurysm,  2014 and 2018.  Has ongoing descending thoracic aortic aneurysm, dissection.  Followed by Dr.Bartle.  Apparently has no surgical options at this point and has been cautioned to avoid surgeries/procedures.     Failing L TKR (remote replacement).  Due to the joint being unstable and painful, she is essentially wheelchair-bound.     Restless leg syndrome with symptoms persistent despite using Requip and doses exceeding prescribed amounts.    PLAN:     Would like to pursue EGD.  However need vascular surgery input to clarify if okay to pursue EGD.  Left msg w Dr Vivi Martens office nurse.    Awaiting follow-up Hgb     Addendum just a few minutes after finishing the note:   Dr. Cyndia Bent  reviewed the case.  His nurse has gotten back to me with his opinion which is that it is okay to proceed with upper endoscopy.  What he has told her in past is that she is not a candidate for any further aneurysm surgery.  Hgb back and measuring 6.8.  Have ordered 1 more PRBC.  Azucena Freed  01/14/2021, 4:27 PM Phone [phone]

## 2021-01-15 ENCOUNTER — Inpatient Hospital Stay (HOSPITAL_COMMUNITY): Payer: PPO | Admitting: Anesthesiology

## 2021-01-15 ENCOUNTER — Encounter (HOSPITAL_COMMUNITY): Payer: Self-pay | Admitting: Internal Medicine

## 2021-01-15 ENCOUNTER — Telehealth: Payer: Self-pay | Admitting: Lab

## 2021-01-15 ENCOUNTER — Encounter (HOSPITAL_COMMUNITY)
Admission: EM | Disposition: A | Discharge status: Home / Self Care | Payer: Self-pay | Source: Home / Self Care | Attending: Internal Medicine

## 2021-01-15 HISTORY — PX: ESOPHAGOGASTRODUODENOSCOPY (EGD) WITH PROPOFOL: SHX5813

## 2021-01-15 LAB — BASIC METABOLIC PANEL
Anion gap: 8 (ref 5–15)
BUN: 26 mg/dL — ABNORMAL HIGH (ref 8–23)
CO2: 26 mmol/L (ref 22–32)
Calcium: 8.7 mg/dL — ABNORMAL LOW (ref 8.9–10.3)
Chloride: 104 mmol/L (ref 98–111)
Creatinine, Ser: 1.52 mg/dL — ABNORMAL HIGH (ref 0.44–1.00)
GFR, Estimated: 34 mL/min — ABNORMAL LOW (ref >60)
Glucose, Bld: 107 mg/dL — ABNORMAL HIGH (ref 70–99)
Potassium: 2.9 mmol/L — ABNORMAL LOW (ref 3.5–5.1)
Sodium: 138 mmol/L (ref 135–145)

## 2021-01-15 LAB — CBC
HCT: 22.1 % — ABNORMAL LOW (ref 36.0–46.0)
Hemoglobin: 6.8 g/dL — CL (ref 12.0–15.0)
MCH: 24.7 pg — ABNORMAL LOW (ref 26.0–34.0)
MCHC: 30.8 g/dL (ref 30.0–36.0)
MCV: 80.4 fL (ref 80.0–100.0)
Platelets: 78 10*3/uL — ABNORMAL LOW (ref 150–400)
RBC: 2.75 MIL/uL — ABNORMAL LOW (ref 3.87–5.11)
RDW: 15.8 % — ABNORMAL HIGH (ref 11.5–15.5)
WBC: 2.8 10*3/uL — ABNORMAL LOW (ref 4.0–10.5)
nRBC: 1.8 % — ABNORMAL HIGH (ref 0.0–0.2)

## 2021-01-15 LAB — IRON AND TIBC
Iron: 274 ug/dL — ABNORMAL HIGH (ref 28–170)
Saturation Ratios: 59 % — ABNORMAL HIGH (ref 10.4–31.8)
TIBC: 461 ug/dL — ABNORMAL HIGH (ref 250–450)
UIBC: 187 ug/dL

## 2021-01-15 LAB — FERRITIN: Ferritin: 19 ng/mL (ref 11–307)

## 2021-01-15 LAB — PREPARE RBC (CROSSMATCH)

## 2021-01-15 SURGERY — ESOPHAGOGASTRODUODENOSCOPY (EGD) WITH PROPOFOL
Anesthesia: Monitor Anesthesia Care

## 2021-01-15 MED ORDER — PEG-KCL-NACL-NASULF-NA ASC-C 100 G PO SOLR
0.5000 | Freq: Once | ORAL | Status: AC
  Administered 2021-01-16: 100 g via ORAL

## 2021-01-15 MED ORDER — SODIUM CHLORIDE 0.9% IV SOLUTION: Freq: Once | INTRAVENOUS | Status: AC

## 2021-01-15 MED ORDER — PEG-KCL-NACL-NASULF-NA ASC-C 100 G PO SOLR
0.5000 | Freq: Once | ORAL | Status: DC
  Filled 2021-01-15: qty 1

## 2021-01-15 MED ORDER — PROPOFOL 500 MG/50ML IV EMUL
INTRAVENOUS | Status: DC | PRN
  Administered 2021-01-15: 100 ug/kg/min via INTRAVENOUS

## 2021-01-15 MED ORDER — METOCLOPRAMIDE HCL 5 MG/ML IJ SOLN
5.0000 mg | Freq: Four times a day (QID) | INTRAMUSCULAR | Status: AC
  Administered 2021-01-15 – 2021-01-16 (×2): 5 mg via INTRAVENOUS
  Filled 2021-01-15 (×2): qty 2

## 2021-01-15 MED ORDER — PEG-KCL-NACL-NASULF-NA ASC-C 100 G PO SOLR: 1.0000 | Freq: Once | ORAL | Status: DC

## 2021-01-15 MED ORDER — SODIUM CHLORIDE 0.9 % IV SOLN: INTRAVENOUS | Status: DC | PRN

## 2021-01-15 MED ORDER — POTASSIUM CHLORIDE CRYS ER 20 MEQ PO TBCR
40.0000 meq | EXTENDED_RELEASE_TABLET | Freq: Three times a day (TID) | ORAL | Status: AC
  Administered 2021-01-15 (×2): 40 meq via ORAL
  Filled 2021-01-15 (×3): qty 2

## 2021-01-15 MED ORDER — POTASSIUM CHLORIDE 10 MEQ/100ML IV SOLN
10.0000 meq | INTRAVENOUS | Status: AC
Start: 2021-01-15 — End: 2021-01-15
  Administered 2021-01-15 (×2): 10 meq via INTRAVENOUS
  Filled 2021-01-15: qty 100

## 2021-01-15 MED ORDER — PHENYLEPHRINE HCL-NACL 20-0.9 MG/250ML-% IV SOLN
INTRAVENOUS | Status: DC | PRN
  Administered 2021-01-15: 50 ug/min via INTRAVENOUS

## 2021-01-15 MED ORDER — BISACODYL 5 MG PO TBEC
20.0000 mg | DELAYED_RELEASE_TABLET | Freq: Once | ORAL | Status: AC
  Administered 2021-01-15: 20 mg via ORAL
  Filled 2021-01-15: qty 4

## 2021-01-15 NOTE — ED Notes (Signed)
Unable to obtain 0100 H&H due to ongoing blood transfusion - will obtain after transfusion is complete.

## 2021-01-15 NOTE — Transfer of Care (Signed)
Immediate Anesthesia Transfer of Care Note  Patient: Diane Abbott  Procedure(s) Performed: ESOPHAGOGASTRODUODENOSCOPY (EGD) WITH PROPOFOL  Patient Location: PACU  Anesthesia Type:MAC  Level of Consciousness: drowsy  Airway & Oxygen Therapy: Patient Spontanous Breathing and Patient connected to nasal cannula oxygen  Post-op Assessment: Report given to RN and Post -op Vital signs reviewed and stable  Post vital signs: Reviewed and stable  Last Vitals:  Vitals Value Taken Time  BP 110/41 01/15/21 1438  Temp 36.8 C 01/15/21 1438  Pulse 66 01/15/21 1439  Resp 23 01/15/21 1439  SpO2 100 % 01/15/21 1439  Vitals shown include unvalidated device data.  Last Pain:  Vitals:   01/15/21 1438  TempSrc: Temporal  PainSc: 0-No pain         Complications: No notable events documented.

## 2021-01-15 NOTE — ED Notes (Signed)
Admitting provider at bedside.

## 2021-01-15 NOTE — Anesthesia Preprocedure Evaluation (Addendum)
Anesthesia Evaluation  Patient identified by MRN, date of birth, ID band Patient awake    Reviewed: Allergy & Precautions, NPO status , Patient's Chart, lab work & pertinent test results  Airway Mallampati: I       Dental  (+) Teeth Intact   Pulmonary shortness of breath, COPD,  COPD inhaler,    Pulmonary exam normal        Cardiovascular hypertension, Pt. on medications + Valvular Problems/Murmurs (s/p Bentall 2014)  Rhythm:Regular Rate:Normal  Echo 12/2018: 1. The left ventricle has normal systolic function with an ejection  fraction of 60-65%. The cavity size was normal. Left ventricular diastolic  Doppler parameters are consistent with pseudonormalization.  2. The right ventricle has normal systolic function. The cavity was  normal. There is no increase in right ventricular wall thickness.  3. No evidence of mitral valve stenosis.  4. No stenosis of the aortic valve.  5. The aorta is normal unless otherwise noted.  6. The aortic root and ascending aorta are normal in size and structure.  7. The atrial septum is grossly normal.     CT this admission for ongoing thoracic aneurysm, dissection: thoracic aortic aneurysm and dissection, measuring up to 4.7 cm.    Neuro/Psych PSYCHIATRIC DISORDERS Depression negative neurological ROS     GI/Hepatic Neg liver ROS, hiatal hernia, GERD  Medicated and Controlled,ABLA melena  Solid dysphagia- No esophageal stricture on EGD 2012 but esophagram then showed hypertrophied cricopharyngeus muscle.   Endo/Other  negative endocrine ROS  Renal/GU Renal Insufficiency and CRFRenal diseaseCr 1.52 K 2.9 this AM 8:45  negative genitourinary   Musculoskeletal  (+) Arthritis , Rheumatoid disorders,  Fibromyalgia -  Abdominal Normal abdominal exam  (+)   Peds  Hematology  (+) Blood dyscrasia, anemia , 6.8/22.1, plt 78 Myeloproliferative disorder- chronic thrombocytopenia     Anesthesia Other Findings   Reproductive/Obstetrics negative OB ROS                           Anesthesia Physical Anesthesia Plan  ASA: 4  Anesthesia Plan: MAC   Post-op Pain Management:    Induction: Intravenous  PONV Risk Score and Plan: 0 and Propofol infusion  Airway Management Planned: Simple Face Mask and Natural Airway  Additional Equipment: None  Intra-op Plan:   Post-operative Plan:   Informed Consent: I have reviewed the patients History and Physical, chart, labs and discussed the procedure including the risks, benefits and alternatives for the proposed anesthesia with the patient or authorized representative who has indicated his/her understanding and acceptance.   Patient has DNR.  Discussed DNR with patient and Suspend DNR.   Dental advisory given  Plan Discussed with: CRNA  Anesthesia Plan Comments:       Anesthesia Quick Evaluation

## 2021-01-15 NOTE — ED Notes (Signed)
Pt in ENDO

## 2021-01-15 NOTE — Op Note (Signed)
[facility] Patient Name: Diane Abbott Procedure Date : 01/15/2021 MRN: [id number] Attending MD: Georgian Co ,  Date of Birth: 09/28/1934 CSN: [id number] Age: 86 Admit Type: Inpatient Procedure:                Upper GI endoscopy Indications:              Iron deficiency anemia, Melena Providers:                Adline Mango" Michaelle Birks, RN, Janie Billups,                            Carolan Shiver CRNA Referring MD:             Hospitalist team Medicines:                Monitored Anesthesia Care Complications:            No immediate complications. Estimated Blood Loss:     Estimated blood loss was minimal. Procedure:                Pre-Anesthesia Assessment:                           - Prior to the procedure, a History and Physical                            was performed, and patient medications and                            allergies were reviewed. The patient is competent.                            The risks and benefits of the procedure and the                            sedation options and risks were discussed with the                            patient. All questions were answered and informed                            consent was obtained. Patient identification and                            proposed procedure were verified by the physician                            in the pre-procedure area. Mental Status                            Examination: normal. Prophylactic Antibiotics: The                            patient does not require prophylactic antibiotics.  Prior Anticoagulants: The patient has taken no                            previous anticoagulant or antiplatelet agents                            except for aspirin. ASA Grade Assessment: III - A                            patient with severe systemic disease. After                            reviewing the risks and benefits, the patient was                             deemed in satisfactory condition to undergo the                            procedure. The anesthesia plan was to use monitored                            anesthesia care (MAC). Immediately prior to                            administration of medications, the patient was                            re-assessed for adequacy to receive sedatives. The                            heart rate, respiratory rate, oxygen saturations,                            blood pressure, adequacy of pulmonary ventilation,                            and response to care were monitored throughout the                            procedure. The physical status of the patient was                            re-assessed after the procedure.                           After obtaining informed consent, the endoscope was                            passed under direct vision. Throughout the                            procedure, the patient's blood pressure, pulse, and  oxygen saturations were monitored continuously. The                            [id number]-[id number] ([id number] Olympus endoscope was introduced                            through the mouth, and advanced to the second part                            of duodenum. The upper GI endoscopy was                            accomplished without difficulty. The patient                            tolerated the procedure well. Scope In: Scope Out: Findings:      One benign-appearing, intrinsic moderate (circumferential scarring or       stenosis; an endoscope may pass) stenosis was found in the lower third       of the esophagus. This stenosis measured less than one cm (in length).       The stenosis was traversed.      The entire examined stomach was normal.      The examined duodenum was normal. Impression:               - Benign-appearing esophageal stenosis.                           - Normal stomach.                           -  Normal examined duodenum.                           - No specimens collected. Recommendation:           - Return patient to hospital ward for ongoing care.                           - Continue to trend blood counts. Transfuse for                            Hb<7.                           - Perform a colonoscopy tomorrow if patient is                            amenable.                           - The findings and recommendations were discussed                            with the patient. Procedure Code(s):        --- Professional ---  56256, Esophagogastroduodenoscopy, flexible,                            transoral; diagnostic, including collection of                            specimen(s) by brushing or washing, when performed                            (separate procedure) Diagnosis Code(s):        --- Professional ---                           K22.2, Esophageal obstruction                           D50.9, Iron deficiency anemia, unspecified                           K92.1, Melena (includes Hematochezia) CPT copyright 2019 American Medical Association. All rights reserved. The codes documented in this report are preliminary and upon coder review may  be revised to meet current compliance requirements. [name] "[name],  01/15/2021 2:43:57 PM Number of Addenda: 0

## 2021-01-15 NOTE — ED Notes (Signed)
Patient is getting blood at thistime

## 2021-01-15 NOTE — Progress Notes (Signed)
  Chronic Care Management   Outreach Note  01/15/2021 Name: Diane Abbott MRN: [id number] DOB: [birth year]  Referred by: Cassandria Anger, MD Reason for referral : Medication Management   An unsuccessful telephone outreach was attempted today. The patient was referred to the pharmacist for assistance with care management and care coordination.   Follow Up Plan:  [place]

## 2021-01-15 NOTE — Interval H&P Note (Signed)
History and Physical Interval Note:  01/15/2021 1:49 PM  Diane Abbott  has presented today for surgery, with the diagnosis of Tarry stool a week ago.  Hgb 2.7.  Received 3 PRBCs..  The various methods of treatment have been discussed with the patient and family. After consideration of risks, benefits and other options for treatment, the patient has consented to  Procedure(s): ESOPHAGOGASTRODUODENOSCOPY (EGD) WITH PROPOFOL (N/A) as a surgical intervention.  The patient's history has been reviewed, patient examined, no change in status, stable for surgery.  I have reviewed the patient's chart and labs.  Questions were answered to the patient's satisfaction.     Sharyn Creamer

## 2021-01-15 NOTE — Anesthesia Postprocedure Evaluation (Signed)
Anesthesia Post Note  Patient: Diane Abbott  Procedure(s) Performed: ESOPHAGOGASTRODUODENOSCOPY (EGD) WITH PROPOFOL     Patient location during evaluation: PACU Anesthesia Type: MAC Level of consciousness: awake and alert Pain management: pain level controlled Vital Signs Assessment: post-procedure vital signs reviewed and stable Respiratory status: spontaneous breathing, nonlabored ventilation, respiratory function stable and patient connected to nasal cannula oxygen Cardiovascular status: stable and blood pressure returned to baseline Postop Assessment: no apparent nausea or vomiting Anesthetic complications: no   No notable events documented.  Last Vitals:  Vitals:   01/15/21 1448 01/15/21 1500  BP: (!) 96/38 (!) 106/49  Pulse: 66 70  Resp: 13 12  Temp:    SpO2: 99% 98%    Last Pain:  Vitals:   01/15/21 1500  TempSrc:   PainSc: 0-No pain                 Effie Berkshire

## 2021-01-15 NOTE — Progress Notes (Signed)
PROGRESS NOTE    Diane Abbott  [id number] DOB: [birth year] DOA: 01/14/2021 PCP: Cassandria Anger, MD    Brief Narrative:  86 year old female with extensive history of thoracic and abdominal aneurysm status postrepair x2, biosynthetic aortic valve replacement, CKD stage IIIb, hiatal hernia, chronic ambulatory dysfunction secondary left knee severe osteoarthritis on chronic narcotics presented with increasing shortness of breath, lethargy and weakness.  Lives at home with her daughter.  Her daughter also has some trouble ambulating.  She has been looking short winded for the last 2 weeks at least.  Patient does report noticing blackish stool last few weeks that smelled very bad as per her daughter. In the emergency room.  Hemodynamically stable.  Hemoglobin 2.7.  FOBT negative.   Assessment & Plan:   Active Problems:   Symptomatic anemia   Lower GI bleed  Severe symptomatic anemia: Hemoglobin dropped from 12-2.7 in 1 year.  Suspect acute on chronic GI bleed.  Currently hemodynamically stable. Received 3 units of PRBC, 1 unit of fresh frozen plasma and 1 dose of calcium gluconate.  Symptomatically improving.  Recheck hemoglobin today.  Check hemoglobin every 8 hours to ensure stabilization. Primary cause unknown.  Suspect chronic intermittent GI bleed with history of melena.  No ongoing NSAIDs use.  Remains on PPI.  Followed by GI. She is okay for upper GI endoscopy if needed, she does not want a colonoscopy. Currently on clears.  Will wait for GI consultation.  Elevated troponins: Demand ischemia.  Flat.  No evidence of acute coronary syndrome.  History of multiple thoracic and abdominal aortic aneurysms status post multiple repairs: Has been stable.  Blood pressures are stable.  Unlikely bleeding.  If no GI source found, may need angiogram.   CKD stage IIIb: At about usual levels.  Monitor.  Chronic ember dysfunction secondary left knee osteoarthritis: Takes oxycodone and  liquid.  Mostly wheelchair-bound.   DVT prophylaxis: SCDs Start: 01/14/21 1729   Code Status: DNR Family Communication: None, will call Disposition Plan: Status is: Inpatient  Remains inpatient appropriate because:Inpatient level of care appropriate due to severity of illness  Dispo: The patient is from: Home              Anticipated d/c is to: Home              Patient currently is not medically stable to d/c.   Difficult to place patient No         Consultants:  Gastroenterology  Procedures:  None  Antimicrobials:  None   Subjective: Patient seen and examined.  She tells me that after 2 days of being not able to eat, now she wants to eat meal.  She is hungry.  Wants some real food.  Denies any nausea vomiting or abdominal pain. Reports no bowel movement for the last 7 days which is usual for her. Received blood transfusion without trouble. She is okay to go for upper GI endoscopy, she does not like to go for colonoscopy.  Objective: Vitals:   01/15/21 0630 01/15/21 0645 01/15/21 0700 01/15/21 0715  BP: (!) 99/55 (!) 113/50 (!) 101/53 (!) 109/51  Pulse: (!) 59 67 61 62  Resp: '16 17 19 13  '$ Temp:      TempSrc:      SpO2: 100% 100% 100% 99%  Weight:      Height:        Intake/Output Summary (Last 24 hours) at 01/15/2021 0827 Last data filed at 01/15/2021 0746 Gross per 24 hour  Intake 3380 ml  Output --  Net 3380 ml   Filed Weights   01/14/21 1356  Weight: 54.9 kg    Examination:  General exam: Appears calm and comfortable  Frail and debilitated.  Chronically sick looking.  Pale.  Not in any distress. Respiratory system: Clear to auscultation. Respiratory effort normal. Cardiovascular system: S1 & S2 heard, loud mechanical sounds all over the precordium. gastrointestinal system: Abdomen is nondistended, soft and nontender. No organomegaly or masses felt. Normal bowel sounds heard. Central nervous system: Alert and oriented. No focal neurological  deficits. Extremities: Symmetric 5 x 5 power. Skin: No rashes, lesions or ulcers Psychiatry: Judgement and insight appear normal. Mood & affect appropriate.     Data Reviewed: I have personally reviewed following labs and imaging studies  CBC: Recent Labs  Lab 01/14/21 1357 01/14/21 1728  WBC 3.2*  --   NEUTROABS 2.2  --   HGB 2.7* 2.7*  HCT 9.9* 10.2*  MCV 81.1  --   PLT 87*  --    Basic Metabolic Panel: Recent Labs  Lab 01/14/21 1357  NA 138  K 3.4*  CL 107  CO2 22  GLUCOSE 100*  BUN 32*  CREATININE 1.45*  CALCIUM 8.6*   GFR: Estimated Creatinine Clearance: 22.5 mL/min (A) (by C-G formula based on SCr of 1.45 mg/dL (H)). Liver Function Tests: No results for input(s): AST, ALT, ALKPHOS, BILITOT, PROT, ALBUMIN in the last 168 hours. No results for input(s): LIPASE, AMYLASE in the last 168 hours. No results for input(s): AMMONIA in the last 168 hours. Coagulation Profile: Recent Labs  Lab 01/14/21 1357  INR 1.3*   Cardiac Enzymes: No results for input(s): CKTOTAL, CKMB, CKMBINDEX, TROPONINI in the last 168 hours. BNP (last 3 results) No results for input(s): PROBNP in the last 8760 hours. HbA1C: No results for input(s): HGBA1C in the last 72 hours. CBG: No results for input(s): GLUCAP in the last 168 hours. Lipid Profile: No results for input(s): CHOL, HDL, LDLCALC, TRIG, CHOLHDL, LDLDIRECT in the last 72 hours. Thyroid Function Tests: No results for input(s): TSH, T4TOTAL, FREET4, T3FREE, THYROIDAB in the last 72 hours. Anemia Panel: Recent Labs    01/14/21 1728  RETICCTPCT 5.0*   Sepsis Labs: No results for input(s): PROCALCITON, LATICACIDVEN in the last 168 hours.  Recent Results (from the past 240 hour(s))  Resp Panel by RT-PCR (Flu A&B, Covid) Nasopharyngeal Swab     Status: None   Collection Time: 01/14/21  5:26 PM   Specimen: Nasopharyngeal Swab; Nasopharyngeal(NP) swabs in vial transport medium  Result Value Ref Range Status   SARS  Coronavirus 2 by RT PCR NEGATIVE NEGATIVE Final    Comment: (NOTE) SARS-CoV-2 target nucleic acids are NOT DETECTED.  The SARS-CoV-2 RNA is generally detectable in upper respiratory specimens during the acute phase of infection. The lowest concentration of SARS-CoV-2 viral copies this assay can detect is 138 copies/mL. A negative result does not preclude SARS-Cov-2 infection and should not be used as the sole basis for treatment or other patient management decisions. A negative result may occur with  improper specimen collection/handling, submission of specimen other than nasopharyngeal swab, presence of viral mutation(s) within the areas targeted by this assay, and inadequate number of viral copies(<138 copies/mL). A negative result must be combined with clinical observations, patient history, and epidemiological information. The expected result is Negative.  Fact Sheet for Patients:  EntrepreneurPulse.com.au  Fact Sheet for Healthcare Providers:  IncredibleEmployment.be  This test is no t yet approved or  cleared by the [place] and  has been authorized for detection and/or diagnosis of SARS-CoV-2 by FDA under an Emergency Use Authorization (EUA). This EUA will remain  in effect (meaning this test can be used) for the duration of the COVID-19 declaration under Section 564(b)(1) of the Act, 21 U.S.C.section 360bbb-3(b)(1), unless the authorization is terminated  or revoked sooner.       Influenza A by PCR NEGATIVE NEGATIVE Final   Influenza B by PCR NEGATIVE NEGATIVE Final    Comment: (NOTE) The Xpert Xpress SARS-CoV-2/FLU/RSV plus assay is intended as an aid in the diagnosis of influenza from Nasopharyngeal swab specimens and should not be used as a sole basis for treatment. Nasal washings and aspirates are unacceptable for Xpert Xpress SARS-CoV-2/FLU/RSV testing.  Fact Sheet for  Patients: EntrepreneurPulse.com.au  Fact Sheet for Healthcare Providers: IncredibleEmployment.be  This test is not yet approved or cleared by the [place] FDA and has been authorized for detection and/or diagnosis of SARS-CoV-2 by FDA under an Emergency Use Authorization (EUA). This EUA will remain in effect (meaning this test can be used) for the duration of the COVID-19 declaration under Section 564(b)(1) of the Act, 21 U.S.C. section 360bbb-3(b)(1), unless the authorization is terminated or revoked.  Performed at [facility], [place] [address], [place], [place] [address]          Radiology Studies: DG Chest Port 1 View  Result Date: 01/14/2021 CLINICAL DATA:  sob EXAM: PORTABLE CHEST 1 VIEW COMPARISON:  August 26, 2020. FINDINGS: Patient rotation to the left. Streaky left basilar opacities, most likely atelectasis. No confluent consolidation. Possible small left pleural effusion versus pleural thickening. No visible pneumothorax. Similar cardiomediastinal silhouette. Aortic valve replacement and median sternotomy. Similar tortuosity of the aorta. Partially imaged left reverse shoulder arthroplasty. Thoracic dextrocurvature. Polyarticular degenerative change. IMPRESSION: 1. Streaky left basilar opacities, most likely atelectasis. 2. Possible small left pleural effusion versus pleural thickening. Electronically Signed   By: Margaretha Sheffield M.D.   On: 01/14/2021 14:36        Scheduled Meds:  fluticasone furoate-vilanterol  1 puff Inhalation Daily   pantoprazole (PROTONIX) IV  40 mg Intravenous Q12H   rOPINIRole  4 mg Oral TID   Continuous Infusions:   LOS: 1 day    Time spent: 35 minutes    Barb Merino, MD [facility] Pager [phone]

## 2021-01-16 ENCOUNTER — Encounter (HOSPITAL_COMMUNITY)
Admission: EM | Disposition: A | Discharge status: Home / Self Care | Payer: Self-pay | Source: Home / Self Care | Attending: Internal Medicine

## 2021-01-16 ENCOUNTER — Encounter (HOSPITAL_COMMUNITY): Payer: Self-pay | Admitting: Internal Medicine

## 2021-01-16 ENCOUNTER — Inpatient Hospital Stay (HOSPITAL_COMMUNITY): Payer: PPO | Admitting: Anesthesiology

## 2021-01-16 DIAGNOSIS — K633 Ulcer of intestine: Secondary | ICD-10-CM

## 2021-01-16 DIAGNOSIS — K922 Gastrointestinal hemorrhage, unspecified: Secondary | ICD-10-CM

## 2021-01-16 DIAGNOSIS — K5520 Angiodysplasia of colon without hemorrhage: Secondary | ICD-10-CM

## 2021-01-16 HISTORY — PX: BIOPSY: SHX5522

## 2021-01-16 HISTORY — PX: COLONOSCOPY WITH PROPOFOL: SHX5780

## 2021-01-16 HISTORY — PX: HOT HEMOSTASIS: SHX5433

## 2021-01-16 LAB — TYPE AND SCREEN
ABO/RH(D): A POS
Antibody Screen: NEGATIVE
Unit division: 0
Unit division: 0
Unit division: 0
Unit division: 0
Weak D: POSITIVE

## 2021-01-16 LAB — CBC WITH DIFFERENTIAL/PLATELET
Abs Immature Granulocytes: 0.05 10*3/uL (ref 0.00–0.07)
Basophils Absolute: 0 10*3/uL (ref 0.0–0.1)
Basophils Relative: 1 %
Eosinophils Absolute: 0 10*3/uL (ref 0.0–0.5)
Eosinophils Relative: 1 %
HCT: 25.5 % — ABNORMAL LOW (ref 36.0–46.0)
Hemoglobin: 8.3 g/dL — ABNORMAL LOW (ref 12.0–15.0)
Immature Granulocytes: 1 %
Lymphocytes Relative: 13 %
Lymphs Abs: 0.5 10*3/uL — ABNORMAL LOW (ref 0.7–4.0)
MCH: 25.9 pg — ABNORMAL LOW (ref 26.0–34.0)
MCHC: 32.5 g/dL (ref 30.0–36.0)
MCV: 79.7 fL — ABNORMAL LOW (ref 80.0–100.0)
Monocytes Absolute: 0.3 10*3/uL (ref 0.1–1.0)
Monocytes Relative: 6 %
Neutro Abs: 3.3 10*3/uL (ref 1.7–7.7)
Neutrophils Relative %: 78 %
Platelets: 83 10*3/uL — ABNORMAL LOW (ref 150–400)
RBC: 3.2 MIL/uL — ABNORMAL LOW (ref 3.87–5.11)
RDW: 15.6 % — ABNORMAL HIGH (ref 11.5–15.5)
WBC: 4.2 10*3/uL (ref 4.0–10.5)
nRBC: 1 % — ABNORMAL HIGH (ref 0.0–0.2)

## 2021-01-16 LAB — BPAM RBC
Blood Product Expiration Date: 202209212359
Blood Product Expiration Date: 202210082359
Blood Product Expiration Date: 202210082359
Blood Product Expiration Date: 202210082359
ISSUE DATE / TIME: 202209131656
ISSUE DATE / TIME: 202209132100
ISSUE DATE / TIME: 202209140355
ISSUE DATE / TIME: 202209141154
Unit Type and Rh: 6200
Unit Type and Rh: 6200
Unit Type and Rh: 6200
Unit Type and Rh: 6200

## 2021-01-16 LAB — BASIC METABOLIC PANEL
Anion gap: 10 (ref 5–15)
BUN: 22 mg/dL (ref 8–23)
CO2: 24 mmol/L (ref 22–32)
Calcium: 9.1 mg/dL (ref 8.9–10.3)
Chloride: 108 mmol/L (ref 98–111)
Creatinine, Ser: 1.36 mg/dL — ABNORMAL HIGH (ref 0.44–1.00)
GFR, Estimated: 38 mL/min — ABNORMAL LOW (ref >60)
Glucose, Bld: 95 mg/dL (ref 70–99)
Potassium: 3.8 mmol/L (ref 3.5–5.1)
Sodium: 142 mmol/L (ref 135–145)

## 2021-01-16 LAB — PHOSPHORUS: Phosphorus: 3 mg/dL (ref 2.5–4.6)

## 2021-01-16 LAB — BPAM FFP
Blood Product Expiration Date: 202209142359
ISSUE DATE / TIME: 202209140102
Unit Type and Rh: 6200

## 2021-01-16 LAB — PREPARE FRESH FROZEN PLASMA

## 2021-01-16 LAB — MAGNESIUM: Magnesium: 2 mg/dL (ref 1.7–2.4)

## 2021-01-16 SURGERY — COLONOSCOPY WITH PROPOFOL
Anesthesia: Monitor Anesthesia Care

## 2021-01-16 MED ORDER — PROPOFOL 10 MG/ML IV BOLUS
INTRAVENOUS | Status: DC | PRN
  Administered 2021-01-16: 20 mg via INTRAVENOUS
  Administered 2021-01-16 (×5): 10 mg via INTRAVENOUS
  Administered 2021-01-16 (×2): 20 mg via INTRAVENOUS
  Administered 2021-01-16: 10 mg via INTRAVENOUS
  Administered 2021-01-16: 20 mg via INTRAVENOUS
  Administered 2021-01-16: 10 mg via INTRAVENOUS

## 2021-01-16 MED ORDER — EPHEDRINE SULFATE-NACL 50-0.9 MG/10ML-% IV SOSY
PREFILLED_SYRINGE | INTRAVENOUS | Status: DC | PRN
Start: 2021-01-16 — End: 2021-01-16
  Administered 2021-01-16: 10 mg via INTRAVENOUS

## 2021-01-16 MED ORDER — ONDANSETRON HCL 4 MG/2ML IJ SOLN: 4.0000 mg | Freq: Once | INTRAMUSCULAR | Status: DC | PRN

## 2021-01-16 MED ORDER — SODIUM CHLORIDE 0.9 % IV SOLN
INTRAVENOUS | Status: DC
  Administered 2021-01-16: 500 mL via INTRAVENOUS

## 2021-01-16 MED ORDER — PROPOFOL 500 MG/50ML IV EMUL
INTRAVENOUS | Status: DC | PRN
  Administered 2021-01-16: 100 ug/kg/min via INTRAVENOUS

## 2021-01-16 MED ORDER — PHENYLEPHRINE 40 MCG/ML (10ML) SYRINGE FOR IV PUSH (FOR BLOOD PRESSURE SUPPORT)
PREFILLED_SYRINGE | INTRAVENOUS | Status: DC | PRN
  Administered 2021-01-16: 80 ug via INTRAVENOUS
  Administered 2021-01-16 (×2): 120 ug via INTRAVENOUS
  Administered 2021-01-16: 80 ug via INTRAVENOUS

## 2021-01-16 NOTE — Interval H&P Note (Signed)
History and Physical Interval Note:  01/16/2021 10:09 AM  Diane Abbott  has presented today for surgery, with the diagnosis of anemia,  FOBT negative, dark stool 1 week ago.  EGD unrevealing 9/13.  The various methods of treatment have been discussed with the patient and family. After consideration of risks, benefits and other options for treatment, the patient has consented to  Procedure(s): COLONOSCOPY WITH PROPOFOL (N/A) as a surgical intervention.  The patient's history has been reviewed, patient examined, no change in status, stable for surgery.  I have reviewed the patient's chart and labs.  Questions were answered to the patient's satisfaction.     Sharyn Creamer

## 2021-01-16 NOTE — Transfer of Care (Signed)
Immediate Anesthesia Transfer of Care Note  Patient: Diane Abbott  Procedure(s) Performed: COLONOSCOPY WITH PROPOFOL HOT HEMOSTASIS (ARGON PLASMA COAGULATION/BICAP) BIOPSY  Patient Location: Endoscopy Unit  Anesthesia Type:MAC  Level of Consciousness: drowsy and patient cooperative  Airway & Oxygen Therapy: Patient Spontanous Breathing and Patient connected to nasal cannula oxygen  Post-op Assessment: Report given to RN, Post -op Vital signs reviewed and stable and Patient moving all extremities  Post vital signs: Reviewed and stable  Last Vitals:  Vitals Value Taken Time  BP 137/82 01/16/21 1124  Temp 36.1 C 01/16/21 1121  Pulse 78 01/16/21 1124  Resp 18 01/16/21 1124  SpO2 100 % 01/16/21 1124  Vitals shown include unvalidated device data.  Last Pain:  Vitals:   01/16/21 1124  TempSrc:   PainSc: 0-No pain         Complications: No notable events documented.

## 2021-01-16 NOTE — Plan of Care (Signed)

## 2021-01-16 NOTE — Progress Notes (Signed)
PROGRESS NOTE    Diane Abbott  YWV:[id number] DOB: 1934-08-17 DOA: 01/14/2021 PCP: Cassandria Anger, MD    Brief Narrative:  86 year old female with extensive history of thoracic and abdominal aneurysm status postrepair x2, biosynthetic aortic valve replacement, CKD stage IIIb, hiatal hernia, chronic ambulatory dysfunction secondary left knee severe osteoarthritis on chronic narcotics presented with increasing shortness of breath, lethargy and weakness.  Lives at home with her daughter. Her daughter also has some trouble ambulating.  She has been looking short winded for the last 2 weeks at least.  Patient does report noticing blackish stool last few weeks that smelled very bad as per her daughter. In the emergency room.  Hemodynamically stable.  Hemoglobin 2.7.  FOBT negative.   Assessment & Plan:   Active Problems:   Symptomatic anemia   Lower GI bleed  Severe symptomatic anemia: Hemoglobin dropped from 12-2.7 in 1 year.  Suspect acute on chronic GI bleed.  Currently hemodynamically stable. Total 4 units of PRBC, 1 unit of transfusion plasma and 1 dose of calcium.  Hemoglobin appropriately responded.  8.3 today. Upper GI endoscopy 9/14 with no active findings. Colonoscopy 9/15 with ulcerated sigmoid colon, biopsy taken.  Few AVMs and nonbleeding lesions. Avoid constipation.  Adequate laxatives.  Avoid NSAIDs and aspirin.  Biopsies pending. Recheck hemoglobin tomorrow morning, if adequate will discharge home. Advance diet today. Will infuse 1 dose of IV iron tomorrow morning.  Elevated troponins: Demand ischemia.  Flat.  No evidence of acute coronary syndrome.  History of multiple thoracic and abdominal aortic aneurysms status post multiple repairs: Has been stable.  Blood pressures are stable.  No evidence of complications.  CKD stage IIIb: At about usual levels.  Monitor.  Chronic ambulatory dysfunction secondary left knee osteoarthritis: Takes oxycodone and Mostly  wheelchair-bound.   DVT prophylaxis: SCDs Start: 01/14/21 1729   Code Status: DNR Family Communication: Daughter on the phone. Disposition Plan: Status is: Inpatient  Remains inpatient appropriate because:Inpatient level of care appropriate due to severity of illness  Dispo: The patient is from: Home              Anticipated d/c is to: Home              Patient currently is not medically stable to d/c.   Difficult to place patient No         Consultants:  Gastroenterology  Procedures:  EGD and colonoscopy  Antimicrobials:  None   Subjective: Patient seen and examined.  Came back from procedure.  Denies any complaints.  She was happy to have first real meal after so many days.  Results explained.  Objective: Vitals:   01/16/21 1144 01/16/21 1200 01/16/21 1300 01/16/21 1400  BP: 93/76 (!) 110/55 (!) 102/54 101/66  Pulse: 74 70 75 93  Resp: (!) '22 18 18 18  ' Temp:  97.6 F (36.4 C)    TempSrc:      SpO2: 100%     Weight:      Height:        Intake/Output Summary (Last 24 hours) at 01/16/2021 1616 Last data filed at 01/16/2021 1113 Gross per 24 hour  Intake 250 ml  Output 800 ml  Net -550 ml   Filed Weights   01/14/21 1356  Weight: 54.9 kg    Examination:  General exam: Appears calm and comfortable  Frail and debilitated.  Chronically sick looking.   Respiratory system: Clear to auscultation. Respiratory effort normal. Cardiovascular system: S1 & S2 heard, loud  mechanical sounds all over the precordium. gastrointestinal system: Abdomen is nondistended, soft and nontender. No organomegaly or masses felt. Normal bowel sounds heard. Central nervous system: Alert and oriented. No focal neurological deficits. Extremities: Symmetric 5 x 5 power. Skin: No rashes, lesions or ulcers Psychiatry: Judgement and insight appear normal. Mood & affect appropriate.     Data Reviewed: I have personally reviewed following labs and imaging studies  CBC: Recent Labs   Lab 01/14/21 1357 01/14/21 1728 01/15/21 0842 01/16/21 0539  WBC 3.2*  --  2.8* 4.2  NEUTROABS 2.2  --   --  3.3  HGB 2.7* 2.7* 6.8* 8.3*  HCT 9.9* 10.2* 22.1* 25.5*  MCV 81.1  --  80.4 79.7*  PLT 87*  --  78* 83*   Basic Metabolic Panel: Recent Labs  Lab 01/14/21 1357 01/15/21 0842 01/16/21 0539  NA 138 138 142  K 3.4* 2.9* 3.8  CL 107 104 108  CO2 '22 26 24  ' GLUCOSE 100* 107* 95  BUN 32* 26* 22  CREATININE 1.45* 1.52* 1.36*  CALCIUM 8.6* 8.7* 9.1  MG  --   --  2.0  PHOS  --   --  3.0   GFR: Estimated Creatinine Clearance: 24 mL/min (A) (by C-G formula based on SCr of 1.36 mg/dL (H)). Liver Function Tests: No results for input(s): AST, ALT, ALKPHOS, BILITOT, PROT, ALBUMIN in the last 168 hours. No results for input(s): LIPASE, AMYLASE in the last 168 hours. No results for input(s): AMMONIA in the last 168 hours. Coagulation Profile: Recent Labs  Lab 01/14/21 1357  INR 1.3*   Cardiac Enzymes: No results for input(s): CKTOTAL, CKMB, CKMBINDEX, TROPONINI in the last 168 hours. BNP (last 3 results) No results for input(s): PROBNP in the last 8760 hours. HbA1C: No results for input(s): HGBA1C in the last 72 hours. CBG: No results for input(s): GLUCAP in the last 168 hours. Lipid Profile: No results for input(s): CHOL, HDL, LDLCALC, TRIG, CHOLHDL, LDLDIRECT in the last 72 hours. Thyroid Function Tests: No results for input(s): TSH, T4TOTAL, FREET4, T3FREE, THYROIDAB in the last 72 hours. Anemia Panel: Recent Labs    01/14/21 1728 01/15/21 0842  FERRITIN  --  19  TIBC  --  461*  IRON  --  274*  RETICCTPCT 5.0*  --    Sepsis Labs: No results for input(s): PROCALCITON, LATICACIDVEN in the last 168 hours.  Recent Results (from the past 240 hour(s))  Resp Panel by RT-PCR (Flu A&B, Covid) Nasopharyngeal Swab     Status: None   Collection Time: 01/14/21  5:26 PM   Specimen: Nasopharyngeal Swab; Nasopharyngeal(NP) swabs in vial transport medium  Result Value  Ref Range Status   SARS Coronavirus 2 by RT PCR NEGATIVE NEGATIVE Final    Comment: (NOTE) SARS-CoV-2 target nucleic acids are NOT DETECTED.  The SARS-CoV-2 RNA is generally detectable in upper respiratory specimens during the acute phase of infection. The lowest concentration of SARS-CoV-2 viral copies this assay can detect is 138 copies/mL. A negative result does not preclude SARS-Cov-2 infection and should not be used as the sole basis for treatment or other patient management decisions. A negative result may occur with  improper specimen collection/handling, submission of specimen other than nasopharyngeal swab, presence of viral mutation(s) within the areas targeted by this assay, and inadequate number of viral copies(<138 copies/mL). A negative result must be combined with clinical observations, patient history, and epidemiological information. The expected result is Negative.  Fact Sheet for Patients:  EntrepreneurPulse.com.au  Fact Sheet for Healthcare Providers:  IncredibleEmployment.be  This test is no t yet approved or cleared by the [place] FDA and  has been authorized for detection and/or diagnosis of SARS-CoV-2 by FDA under an Emergency Use Authorization (EUA). This EUA will remain  in effect (meaning this test can be used) for the duration of the COVID-19 declaration under Section 564(b)(1) of the Act, 21 U.S.C.section 360bbb-3(b)(1), unless the authorization is terminated  or revoked sooner.       Influenza A by PCR NEGATIVE NEGATIVE Final   Influenza B by PCR NEGATIVE NEGATIVE Final    Comment: (NOTE) The Xpert Xpress SARS-CoV-2/FLU/RSV plus assay is intended as an aid in the diagnosis of influenza from Nasopharyngeal swab specimens and should not be used as a sole basis for treatment. Nasal washings and aspirates are unacceptable for Xpert Xpress SARS-CoV-2/FLU/RSV testing.  Fact Sheet for  Patients: EntrepreneurPulse.com.au  Fact Sheet for Healthcare Providers: IncredibleEmployment.be  This test is not yet approved or cleared by the [place] FDA and has been authorized for detection and/or diagnosis of SARS-CoV-2 by FDA under an Emergency Use Authorization (EUA). This EUA will remain in effect (meaning this test can be used) for the duration of the COVID-19 declaration under Section 564(b)(1) of the Act, 21 U.S.C. section 360bbb-3(b)(1), unless the authorization is terminated or revoked.  Performed at [facility], [place] [address] [place], [place] [address]          Radiology Studies: No results found.      Scheduled Meds:  fluticasone furoate-vilanterol  1 puff Inhalation Daily   pantoprazole (PROTONIX) IV  40 mg Intravenous Q12H   peg 3350 powder  0.5 kit Oral Once   rOPINIRole  4 mg Oral TID   Continuous Infusions:     LOS: 2 days    Time spent: 30 minutes   Barb Merino, MD [facility] Pager [phone]

## 2021-01-16 NOTE — Anesthesia Postprocedure Evaluation (Signed)
Anesthesia Post Note  Patient: Diane Abbott  Procedure(s) Performed: COLONOSCOPY WITH PROPOFOL HOT HEMOSTASIS (ARGON PLASMA COAGULATION/BICAP) BIOPSY     Patient location during evaluation: PACU Anesthesia Type: MAC Level of consciousness: awake and alert Pain management: pain level controlled Vital Signs Assessment: post-procedure vital signs reviewed and stable Respiratory status: spontaneous breathing, nonlabored ventilation, respiratory function stable and patient connected to nasal cannula oxygen Cardiovascular status: stable and blood pressure returned to baseline Postop Assessment: no apparent nausea or vomiting Anesthetic complications: no   No notable events documented.  Last Vitals:  Vitals:   01/16/21 1300 01/16/21 1400  BP: (!) 102/54 101/66  Pulse: 75 93  Resp: 18 18  Temp:    SpO2:      Last Pain:  Vitals:   01/16/21 1144  TempSrc:   PainSc: 0-No pain                 Osbaldo Mark

## 2021-01-16 NOTE — Anesthesia Procedure Notes (Signed)
Procedure Name: MAC Date/Time: 01/16/2021 10:20 AM Performed by: Moshe Salisbury, CRNA Pre-anesthesia Checklist: Patient identified, Emergency Drugs available, Suction available and Patient being monitored Patient Re-evaluated:Patient Re-evaluated prior to induction Oxygen Delivery Method: Nasal cannula Placement Confirmation: positive ETCO2 Dental Injury: Teeth and Oropharynx as per pre-operative assessment

## 2021-01-16 NOTE — Op Note (Signed)
[facility] Patient Name: Diane Abbott Procedure Date : 01/16/2021 MRN: [id number] Attending MD: Georgian Co ,  Date of Birth: Sep 23, 1934 CSN: [id number] Age: 86 Admit Type: Inpatient Procedure:                Colonoscopy Indications:              Iron deficiency anemia, black stools Providers:                Adline Mango" Stasia Cavalier, RN, Cherylynn Ridges, Technician, Claybon Jabs CRNA, CRNA Referring MD:              Medicines:                Monitored Anesthesia Care Complications:            No immediate complications. Estimated Blood Loss:     Estimated blood loss was minimal. Procedure:                Pre-Anesthesia Assessment:                           - Prior to the procedure, a History and Physical                            was performed, and patient medications and                            allergies were reviewed. The patient is competent.                            The risks and benefits of the procedure and the                            sedation options and risks were discussed with the                            patient. All questions were answered and informed                            consent was obtained. Patient identification and                            proposed procedure were verified by the physician                            in the pre-procedure area. Prophylactic                            Antibiotics: The patient does not require                            prophylactic antibiotics. Prior Anticoagulants: The  patient has taken no previous anticoagulant or                            antiplatelet agents except for aspirin. ASA Grade                            Assessment: III - A patient with severe systemic                            disease. After reviewing the risks and benefits,                            the patient was deemed in satisfactory condition to                             undergo the procedure. The anesthesia plan was to                            use monitored anesthesia care (MAC). Immediately                            prior to administration of medications, the patient                            was re-assessed for adequacy to receive sedatives.                            The heart rate, respiratory rate, oxygen                            saturations, blood pressure, adequacy of pulmonary                            ventilation, and response to care were monitored                            throughout the procedure. The physical status of                            the patient was re-assessed after the procedure.                           After obtaining informed consent, the colonoscope                            was passed under direct vision. Throughout the                            procedure, the patient's blood pressure, pulse, and                            oxygen saturations were monitored continuously. The  PCF-190TL ([id number]) Olympus colonoscope was                            introduced through the anus and advanced to the the                            terminal ileum. The patient tolerated the procedure                            well. The quality of the bowel preparation was                            adequate. Scope In: 10:18:56 AM Scope Out: 11:11:37 AM Scope Withdrawal Time: 0 hours 27 minutes 42 seconds  Total Procedure Duration: 0 hours 52 minutes 41 seconds  Findings:      The terminal ileum appeared normal.      Two localized angioectasias without bleeding were found in the       transverse colon and in the cecum. Coagulation for bleeding prevention       using argon plasma at 0.5 liters/minute and 20 watts was successful.      Multiple diverticula were found in the sigmoid colon.      Multiple ulcers along with inflammation were found in the sigmoid colon.       No bleeding was present. Biopsies  were taken with a cold forceps for       histology.      Non-bleeding external and internal hemorrhoids were found during       perianal exam. Impression:               - The examined portion of the ileum was normal.                           - Two non-bleeding colonic angioectasias. Treated                            with argon plasma coagulation (APC).                           - Diverticulosis in the sigmoid colon.                           - Multiple ulcers in the sigmoid colon. Biopsied.                           - Non-bleeding external and internal hemorrhoids. Recommendation:           - Return patient to hospital ward for ongoing care.                           - Patient's bleeding was suspected to be due to                            ulceration and inflammation that was found in the  sigmoid colon. Also could consider bleeding from                            angioectasias or a diverticular bleed that has now                            resolved.                           - Await pathology results.                           - Avoid taking full dose aspirin or NSAIDs if                            possible.                           - The findings and recommendations were discussed                            with the patient. Procedure Code(s):        --- Professional ---                           [phone], 59, Colonoscopy, flexible; with control of                            bleeding, any method                           45380, Colonoscopy, flexible; with biopsy, single                            or multiple Diagnosis Code(s):        --- Professional ---                           K64.8, Other hemorrhoids                           K55.20, Angiodysplasia of colon without hemorrhage                           K63.3, Ulcer of intestine                           D50.9, Iron deficiency anemia, unspecified                           K57.30, Diverticulosis of large  intestine without                            perforation or abscess without bleeding CPT copyright 2019 American Medical Association. All rights reserved. The codes documented in this report are preliminary and upon coder review may  be revised to meet current compliance requirements. [name] "[name],  01/16/2021 11:32:10 AM Number of  Addenda: 0

## 2021-01-16 NOTE — Anesthesia Preprocedure Evaluation (Signed)
Anesthesia Evaluation  Patient identified by MRN, date of birth, ID band Patient awake    Reviewed: Allergy & Precautions, NPO status , Patient's Chart, lab work & pertinent test results  Airway Mallampati: I       Dental  (+) Teeth Intact   Pulmonary shortness of breath, COPD,  COPD inhaler,    Pulmonary exam normal        Cardiovascular hypertension, Pt. on medications + Valvular Problems/Murmurs (s/p [place] 2014)  Rhythm:Regular Rate:Normal  Echo 12/2018: 1. The left ventricle has normal systolic function with an ejection  fraction of 60-65%. The cavity size was normal. Left ventricular diastolic  Doppler parameters are consistent with pseudonormalization.  2. The right ventricle has normal systolic function. The cavity was  normal. There is no increase in right ventricular wall thickness.  3. No evidence of mitral valve stenosis.  4. No stenosis of the aortic valve.  5. The aorta is normal unless otherwise noted.  6. The aortic root and ascending aorta are normal in size and structure.  7. The atrial septum is grossly normal.     CT this admission for ongoing thoracic aneurysm, dissection: thoracic aortic aneurysm and dissection, measuring up to 4.7 cm.    Neuro/Psych PSYCHIATRIC DISORDERS Depression negative neurological ROS     GI/Hepatic Neg liver ROS, hiatal hernia, GERD  Medicated and Controlled,ABLA melena  Solid dysphagia- No esophageal stricture on EGD 2012 but esophagram then showed hypertrophied cricopharyngeus muscle.   Endo/Other  negative endocrine ROS  Renal/GU Renal Insufficiency and CRFRenal diseaseCr 1.52 K 2.9 this AM 8:45  negative genitourinary   Musculoskeletal  (+) Arthritis , Rheumatoid disorders,  Fibromyalgia -  Abdominal Normal abdominal exam  (+)   Peds  Hematology  (+) Blood dyscrasia, anemia , 6.8/22.1, plt 78 Myeloproliferative disorder- chronic thrombocytopenia     Anesthesia Other Findings   Reproductive/Obstetrics negative OB ROS                           Anesthesia Physical Anesthesia Plan  ASA: 4  Anesthesia Plan: MAC   Post-op Pain Management:    Induction: Intravenous  PONV Risk Score and Plan: 0 and Propofol infusion  Airway Management Planned: Simple Face Mask and Natural Airway  Additional Equipment: None  Intra-op Plan:   Post-operative Plan:   Informed Consent: I have reviewed the patients History and Physical, chart, labs and discussed the procedure including the risks, benefits and alternatives for the proposed anesthesia with the patient or authorized representative who has indicated his/her understanding and acceptance.   Patient has DNR.  Discussed DNR with patient and Suspend DNR.   Dental advisory given  Plan Discussed with: CRNA  Anesthesia Plan Comments:       Anesthesia Quick Evaluation  

## 2021-01-17 LAB — CBC WITH DIFFERENTIAL/PLATELET
Abs Immature Granulocytes: 0.02 10*3/uL (ref 0.00–0.07)
Basophils Absolute: 0 10*3/uL (ref 0.0–0.1)
Basophils Relative: 0 %
Eosinophils Absolute: 0.1 10*3/uL (ref 0.0–0.5)
Eosinophils Relative: 4 %
HCT: 23.3 % — ABNORMAL LOW (ref 36.0–46.0)
Hemoglobin: 7.4 g/dL — ABNORMAL LOW (ref 12.0–15.0)
Immature Granulocytes: 1 %
Lymphocytes Relative: 20 %
Lymphs Abs: 0.7 10*3/uL (ref 0.7–4.0)
MCH: 26 pg (ref 26.0–34.0)
MCHC: 31.8 g/dL (ref 30.0–36.0)
MCV: 81.8 fL (ref 80.0–100.0)
Monocytes Absolute: 0.2 10*3/uL (ref 0.1–1.0)
Monocytes Relative: 8 %
Neutro Abs: 2.1 10*3/uL (ref 1.7–7.7)
Neutrophils Relative %: 67 %
Platelets: 74 10*3/uL — ABNORMAL LOW (ref 150–400)
RBC: 2.85 MIL/uL — ABNORMAL LOW (ref 3.87–5.11)
RDW: 15.9 % — ABNORMAL HIGH (ref 11.5–15.5)
WBC: 3.2 10*3/uL — ABNORMAL LOW (ref 4.0–10.5)
nRBC: 0.9 % — ABNORMAL HIGH (ref 0.0–0.2)

## 2021-01-17 LAB — SURGICAL PATHOLOGY

## 2021-01-17 MED ORDER — SODIUM CHLORIDE 0.9 % IV SOLN
250.0000 mg | Freq: Every day | INTRAVENOUS | Status: AC
  Administered 2021-01-17 – 2021-01-18 (×2): 250 mg via INTRAVENOUS
  Filled 2021-01-17 (×2): qty 20

## 2021-01-17 MED ORDER — PANTOPRAZOLE SODIUM 40 MG PO TBEC
40.0000 mg | DELAYED_RELEASE_TABLET | Freq: Every day | ORAL | Status: DC
  Administered 2021-01-18: 40 mg via ORAL
  Filled 2021-01-17: qty 1

## 2021-01-17 MED ORDER — SODIUM CHLORIDE 0.9 % IV SOLN: 510.0000 mg | Freq: Once | INTRAVENOUS | Status: DC

## 2021-01-17 NOTE — Progress Notes (Signed)
PROGRESS NOTE    Diane TRINE  Abbott:[id number] DOB: 17-Mar-1935 DOA: 01/14/2021 PCP: Cassandria Anger, MD    Brief Narrative:  86 year old female with extensive history of thoracic and abdominal aneurysm status postrepair x2, biosynthetic aortic valve replacement, CKD stage IIIb, hiatal hernia, chronic ambulatory dysfunction secondary left knee severe osteoarthritis on chronic narcotics presented with increasing shortness of breath, lethargy and weakness.  Lives at home with her daughter. Her daughter also has some trouble ambulating.  She has been looking short winded for the last 2 weeks at least.  Patient does report noticing blackish stool last few weeks that smelled very bad as per her daughter. In the emergency room.  Hemodynamically stable.  Hemoglobin 2.7.  FOBT negative.   Assessment & Plan:   Active Problems:   Symptomatic anemia   Lower GI bleed  Severe symptomatic anemia: Anemia of acute blood loss.  Lower GI bleeding. Hemoglobin dropped from 12-2.7 in 1 year.  Suspect acute on chronic GI bleed.  Currently hemodynamically stable. Received total 4 units of PRBC and 1 unit of plasma.   Hemoglobin responded from 2.7 -8.3 -7.4.Marland Kitchen  Low iron and ferritin.   No evidence of active bleeding.  Hemoglobin responded as anticipated.   1 unit of IV iron today.   Upper GI endoscopy 9/14 with no active findings. Colonoscopy 9/15 with ulcerated sigmoid colon, biopsy taken.  Few AVMs and nonbleeding lesions. Avoid constipation.  Adequate laxatives.  Avoid NSAIDs and aspirin.  Biopsies pending. Recheck hemoglobin tomorrow morning, if adequate will discharge home. Due to very severe anemia on presentation, will monitor.  Stabilization in the hospital for 1 more day.  Elevated troponins: Demand ischemia.  Flat.  No evidence of acute coronary syndrome.  History of multiple thoracic and abdominal aortic aneurysms status post multiple repairs: Has been stable.  Blood pressures are stable.   No evidence of complications.  CKD stage IIIb: At about usual levels.  Monitor.  Chronic ambulatory dysfunction secondary left knee osteoarthritis: Takes oxycodone and Mostly wheelchair-bound.  Patient is clinically stabilizing.  Hemoglobin drifting down today from 8.3-7.4 which is probably equilibrating.  Given very severe presentation, will continue to monitor for next 24 hours before discharging home.   DVT prophylaxis: SCDs Start: 01/14/21 1729   Code Status: DNR Family Communication: Daughter on the phone 9/15. Disposition Plan: Status is: Inpatient  Remains inpatient appropriate because:Inpatient level of care appropriate due to severity of illness  Dispo: The patient is from: Home              Anticipated d/c is to: Home              Patient currently is not medically stable to d/c.   Difficult to place patient No         Consultants:  Gastroenterology  Procedures:  EGD and colonoscopy  Antimicrobials:  None   Subjective: Patient seen and examined.  Anxious about drop in hemoglobin again but herself denies any complaints. Patient was wondering what else she can use if she is not able to use aspirin or NSAIDs for her left knee.  Discussed about using additional Tylenol and or going up on her doses of oxycodone. No bowel movement after colonoscopy.  Appetite is fair.  Objective: Vitals:   01/16/21 1300 01/16/21 1400 01/16/21 2136 01/17/21 0357  BP: (!) 102/54 101/66 (!) 101/55 (!) 91/43  Pulse: 75 93 80 80  Resp: '18 18 18 16  ' Temp:   97.6 F (36.4 C) 98.1  F (36.7 C)  TempSrc:   Axillary Axillary  SpO2:   100% 97%  Weight:      Height:        Intake/Output Summary (Last 24 hours) at 01/17/2021 1128 Last data filed at 01/17/2021 0900 Gross per 24 hour  Intake 1200 ml  Output 675 ml  Net 525 ml   Filed Weights   01/14/21 1356  Weight: 54.9 kg    Examination:  General exam: Appears calm and comfortable  Frail and debilitated.  Not in any  distress.  On room air. Respiratory system: Clear to auscultation. Respiratory effort normal. Cardiovascular system: S1 & S2 heard, loud mechanical sounds all over the precordium. gastrointestinal system: Abdomen is nondistended, soft and nontender. No organomegaly or masses felt. Normal bowel sounds heard. Central nervous system: Alert and oriented. No focal neurological deficits. Extremities: Symmetric 5 x 5 power. Skin: No rashes, lesions or ulcers Psychiatry: Judgement and insight appear normal. Mood & affect appropriate.     Data Reviewed: I have personally reviewed following labs and imaging studies  CBC: Recent Labs  Lab 01/14/21 1357 01/14/21 1728 01/15/21 0842 01/16/21 0539 01/17/21 0244  WBC 3.2*  --  2.8* 4.2 3.2*  NEUTROABS 2.2  --   --  3.3 2.1  HGB 2.7* 2.7* 6.8* 8.3* 7.4*  HCT 9.9* 10.2* 22.1* 25.5* 23.3*  MCV 81.1  --  80.4 79.7* 81.8  PLT 87*  --  78* 83* 74*   Basic Metabolic Panel: Recent Labs  Lab 01/14/21 1357 01/15/21 0842 01/16/21 0539  NA 138 138 142  K 3.4* 2.9* 3.8  CL 107 104 108  CO2 '22 26 24  ' GLUCOSE 100* 107* 95  BUN 32* 26* 22  CREATININE 1.45* 1.52* 1.36*  CALCIUM 8.6* 8.7* 9.1  MG  --   --  2.0  PHOS  --   --  3.0   GFR: Estimated Creatinine Clearance: 24 mL/min (A) (by C-G formula based on SCr of 1.36 mg/dL (H)). Liver Function Tests: No results for input(s): AST, ALT, ALKPHOS, BILITOT, PROT, ALBUMIN in the last 168 hours. No results for input(s): LIPASE, AMYLASE in the last 168 hours. No results for input(s): AMMONIA in the last 168 hours. Coagulation Profile: Recent Labs  Lab 01/14/21 1357  INR 1.3*   Cardiac Enzymes: No results for input(s): CKTOTAL, CKMB, CKMBINDEX, TROPONINI in the last 168 hours. BNP (last 3 results) No results for input(s): PROBNP in the last 8760 hours. HbA1C: No results for input(s): HGBA1C in the last 72 hours. CBG: No results for input(s): GLUCAP in the last 168 hours. Lipid Profile: No  results for input(s): CHOL, HDL, LDLCALC, TRIG, CHOLHDL, LDLDIRECT in the last 72 hours. Thyroid Function Tests: No results for input(s): TSH, T4TOTAL, FREET4, T3FREE, THYROIDAB in the last 72 hours. Anemia Panel: Recent Labs    01/14/21 1728 01/15/21 0842  FERRITIN  --  19  TIBC  --  461*  IRON  --  274*  RETICCTPCT 5.0*  --    Sepsis Labs: No results for input(s): PROCALCITON, LATICACIDVEN in the last 168 hours.  Recent Results (from the past 240 hour(s))  Resp Panel by RT-PCR (Flu A&B, Covid) Nasopharyngeal Swab     Status: None   Collection Time: 01/14/21  5:26 PM   Specimen: Nasopharyngeal Swab; Nasopharyngeal(NP) swabs in vial transport medium  Result Value Ref Range Status   SARS Coronavirus 2 by RT PCR NEGATIVE NEGATIVE Final    Comment: (NOTE) SARS-CoV-2 target nucleic acids are  NOT DETECTED.  The SARS-CoV-2 RNA is generally detectable in upper respiratory specimens during the acute phase of infection. The lowest concentration of SARS-CoV-2 viral copies this assay can detect is 138 copies/mL. A negative result does not preclude SARS-Cov-2 infection and should not be used as the sole basis for treatment or other patient management decisions. A negative result may occur with  improper specimen collection/handling, submission of specimen other than nasopharyngeal swab, presence of viral mutation(s) within the areas targeted by this assay, and inadequate number of viral copies(<138 copies/mL). A negative result must be combined with clinical observations, patient history, and epidemiological information. The expected result is Negative.  Fact Sheet for Patients:  EntrepreneurPulse.com.au  Fact Sheet for Healthcare Providers:  IncredibleEmployment.be  This test is no t yet approved or cleared by the [place] FDA and  has been authorized for detection and/or diagnosis of SARS-CoV-2 by FDA under an Emergency Use Authorization  (EUA). This EUA will remain  in effect (meaning this test can be used) for the duration of the COVID-19 declaration under Section 564(b)(1) of the Act, 21 U.S.C.section 360bbb-3(b)(1), unless the authorization is terminated  or revoked sooner.       Influenza A by PCR NEGATIVE NEGATIVE Final   Influenza B by PCR NEGATIVE NEGATIVE Final    Comment: (NOTE) The Xpert Xpress SARS-CoV-2/FLU/RSV plus assay is intended as an aid in the diagnosis of influenza from Nasopharyngeal swab specimens and should not be used as a sole basis for treatment. Nasal washings and aspirates are unacceptable for Xpert Xpress SARS-CoV-2/FLU/RSV testing.  Fact Sheet for Patients: EntrepreneurPulse.com.au  Fact Sheet for Healthcare Providers: IncredibleEmployment.be  This test is not yet approved or cleared by the [place] FDA and has been authorized for detection and/or diagnosis of SARS-CoV-2 by FDA under an Emergency Use Authorization (EUA). This EUA will remain in effect (meaning this test can be used) for the duration of the COVID-19 declaration under Section 564(b)(1) of the Act, 21 U.S.C. section 360bbb-3(b)(1), unless the authorization is terminated or revoked.  Performed at [facility], [place] [address] [place], [place] [address]          Radiology Studies: No results found.      Scheduled Meds:  fluticasone furoate-vilanterol  1 puff Inhalation Daily   pantoprazole  40 mg Oral Daily   peg 3350 powder  0.5 kit Oral Once   rOPINIRole  4 mg Oral TID   Continuous Infusions:  ferric gluconate (FERRLECIT) IVPB 250 mg (01/17/21 1024)      LOS: 3 days    Time spent: 30 minutes   Barb Merino, MD [facility] Pager [phone]

## 2021-01-17 NOTE — Care Management Important Message (Signed)
Important Message  Patient Details  Name: Diane Abbott MRN: [id number] Date of Birth: [birth year]   Medicare Important Message Given:  Yes     Maurio Baize [address] 01/17/2021, 2:59 PM

## 2021-01-17 NOTE — Progress Notes (Signed)
MD on call notified of pt hgb 7.4 but not response or orders received. Pt stable and resting in bed with call light within reach. Reported off to oncoming RN. Delia Heady RN

## 2021-01-18 LAB — CBC WITH DIFFERENTIAL/PLATELET
Abs Immature Granulocytes: 0.03 10*3/uL (ref 0.00–0.07)
Basophils Absolute: 0 10*3/uL (ref 0.0–0.1)
Basophils Relative: 1 %
Eosinophils Absolute: 0.1 10*3/uL (ref 0.0–0.5)
Eosinophils Relative: 5 %
HCT: 23.8 % — ABNORMAL LOW (ref 36.0–46.0)
Hemoglobin: 7.5 g/dL — ABNORMAL LOW (ref 12.0–15.0)
Immature Granulocytes: 1 %
Lymphocytes Relative: 22 %
Lymphs Abs: 0.6 10*3/uL — ABNORMAL LOW (ref 0.7–4.0)
MCH: 26.3 pg (ref 26.0–34.0)
MCHC: 31.5 g/dL (ref 30.0–36.0)
MCV: 83.5 fL (ref 80.0–100.0)
Monocytes Absolute: 0.2 10*3/uL (ref 0.1–1.0)
Monocytes Relative: 8 %
Neutro Abs: 1.9 10*3/uL (ref 1.7–7.7)
Neutrophils Relative %: 63 %
Platelets: 70 10*3/uL — ABNORMAL LOW (ref 150–400)
RBC: 2.85 MIL/uL — ABNORMAL LOW (ref 3.87–5.11)
RDW: 16.2 % — ABNORMAL HIGH (ref 11.5–15.5)
WBC: 2.9 10*3/uL — ABNORMAL LOW (ref 4.0–10.5)
nRBC: 1 % — ABNORMAL HIGH (ref 0.0–0.2)

## 2021-01-18 MED ORDER — PANTOPRAZOLE SODIUM 40 MG PO TBEC: 40.0000 mg | DELAYED_RELEASE_TABLET | Freq: Every day | ORAL | 1 refills | Status: AC

## 2021-01-18 NOTE — Discharge Summary (Signed)
Physician Discharge Summary  Diane Abbott [id number] DOB: 12-19-1934 DOA: 01/14/2021  PCP: Cassandria Anger, MD  Admit date: 01/14/2021 Discharge date: 01/18/2021  Admitted From: Home Disposition: Home  Recommendations for Outpatient Follow-up:  Follow up with PCP in 1-2 weeks Please obtain BMP/CBC in one week Please follow up on the following pending results: Sigmoid biopsy  Home Health: Not applicable Equipment/Devices: Not applicable  Discharge Condition: Stable CODE STATUS: Full code Diet recommendation: Regular diet.  Discharge summary: 86 year old female with extensive history of thoracic and abdominal aneurysm status postrepair x2, biosynthetic aortic valve replacement, CKD stage IIIb, hiatal hernia, chronic ambulatory dysfunction secondary left knee severe osteoarthritis on chronic narcotics presented with increasing shortness of breath, lethargy and weakness.  Lives at home with her daughter. She has been looking short winded for the last 2 weeks at least.  Patient does report noticing blackish stool last few weeks that smelled very bad as per her daughter. In the emergency room.  Hemodynamically stable.  Hemoglobin 2.7.  FOBT negative.   Severe symptomatic anemia: Anemia of acute blood loss.  Lower GI bleeding. Hemoglobin dropped from 12-2.7 in 1 year.  Suspect acute on chronic GI bleed.  Currently hemodynamically stable. Received total 4 units of PRBC and 1 unit of plasma.   Hemoglobin responded from 2.7 -8.3 -7.4-7.5.  Given 1 unit of IV iron iron sucrose 300 mg. No evidence of active bleeding.  Hemoglobin responded as anticipated.   Upper GI endoscopy 9/14 with no active findings. Colonoscopy 9/15 with ulcerated sigmoid colon, biopsy taken.  Few AVMs and nonbleeding lesions. Given presence of some AVMs on colonoscopy, she could have some AVMs in her small intestine, however now stabilized. Avoid constipation.  Adequate laxatives.  Avoid NSAIDs and aspirin.   Biopsies pending.   Elevated troponins: Demand ischemia.  Flat.  No evidence of acute coronary syndrome.   History of multiple thoracic and abdominal aortic aneurysms status post multiple repairs: Has been stable.  Blood pressures are stable.  No evidence of complications.   CKD stage IIIb: At about usual levels.  Monitor.   Chronic ambulatory dysfunction secondary left knee osteoarthritis: Takes oxycodone and Mostly wheelchair-bound. Patient will avoid all NSAIDs.  If she continues to have pain on her oxycodone doses, she may need modification of therapy.  Local Voltaren gel to the left knee.  Please recheck hemoglobin in 1 week to ensure stabilization.  May start iron therapy then.  Discharge Diagnoses:  Active Problems:   Symptomatic anemia   Lower GI bleed    Discharge Instructions  Discharge Instructions     Call MD for:  difficulty breathing, headache or visual disturbances   Complete by: As directed    Call MD for:  extreme fatigue   Complete by: As directed    Call MD for:  persistant dizziness or light-headedness   Complete by: As directed    Call MD for:  severe uncontrolled pain   Complete by: As directed    Diet general   Complete by: As directed    Discharge instructions   Complete by: As directed    Take adequate stool softner and laxatives to avoid constipation   Increase activity slowly   Complete by: As directed       Allergies as of 01/18/2021       Reactions   Acetaminophen    Eyes dilate; pt states "can't see"   Codeine Other (See Comments)   Blood pressure drops; "I pass out."  Pramipexole Dihydrochloride Nausea Only   sick, falling   Rofecoxib Other (See Comments)   "sleep walks"   Venlafaxine Other (See Comments)   migraines   Arava [leflunomide] Swelling   Clonazepam    Unknown; pt can't remember   Diclofenac Sodium    Unknown; pt can't remember   Sulfa Antibiotics    constipation   Alprazolam Other (See Comments)   "Makes me  mean"   Darvocet [propoxyphene N-acetaminophen] Other (See Comments)   "makes my eyes dilate. Pt stated I can't see"   Duloxetine Other (See Comments)   achy legs   Gabapentin Other (See Comments)   headache   Latex Itching, Dermatitis   When examined with latex gloves, burns and itches in contact areas per pt.   Morphine And Related Other (See Comments)   Hallucinations   Nortriptyline Hcl Other (See Comments)   Migraines   Penicillins Rash   Has patient had a PCN reaction causing immediate rash, facial/tongue/throat swelling, SOB or lightheadedness with hypotension: Yes Has patient had a PCN reaction causing severe rash involving mucus membranes or skin necrosis: Yes Has patient had a PCN reaction that required hospitalization No Has patient had a PCN reaction occurring within the last 10 years: Yes If all of the above answers are "NO", then may proceed with Cephalosporin use. Can take Cephalosporins   Prednisone Swelling   Just doesn't want to take it.        Medication List     STOP taking these medications    aspirin EC 81 MG tablet   benzonatate 100 MG capsule Commonly known as: TESSALON       TAKE these medications    amLODipine 2.5 MG tablet Commonly known as: NORVASC Take 1 tablet (2.5 mg total) by mouth daily.   lidocaine 5 % Commonly known as: Lidoderm Place 1-2 patches onto the skin daily. Remove & Discard patch within 12 hours or as directed by MD   oxyCODONE 5 MG immediate release tablet Commonly known as: Roxicodone Take 1 tablet (5 mg total) by mouth 3 (three) times daily as needed for severe pain.   pantoprazole 40 MG tablet Commonly known as: PROTONIX Take 1 tablet (40 mg total) by mouth daily.   rOPINIRole 4 MG tablet Commonly known as: REQUIP TAKE 1 TABLET BY MOUTH 3 TIMES A DAY   Trelegy Ellipta 100-62.5-25 MCG/INH Aepb Generic drug: Fluticasone-Umeclidin-Vilant 1 inh daily        Follow-up Information     Plotnikov, Evie Lacks, MD Follow up in 1 week(s).   Specialty: Internal Medicine Why: please check hemoglobin level Contact information: [place] [id number] [phone]                Allergies  Allergen Reactions   Acetaminophen     Eyes dilate; pt states "can't see"   Codeine Other (See Comments)    Blood pressure drops; "I pass out."    Pramipexole Dihydrochloride Nausea Only    sick, falling   Rofecoxib Other (See Comments)    "sleep walks"   Venlafaxine Other (See Comments)    migraines   Arava [Leflunomide] Swelling   Clonazepam     Unknown; pt can't remember   Diclofenac Sodium     Unknown; pt can't remember   Sulfa Antibiotics     constipation   Alprazolam Other (See Comments)    "Makes me mean"   Darvocet [Propoxyphene N-Acetaminophen] Other (See Comments)    "makes my  eyes dilate. Pt stated I can't see"   Duloxetine Other (See Comments)    achy legs   Gabapentin Other (See Comments)    headache   Latex Itching and Dermatitis    When examined with latex gloves, burns and itches in contact areas per pt.   Morphine And Related Other (See Comments)    Hallucinations    Nortriptyline Hcl Other (See Comments)    Migraines    Penicillins Rash    Has patient had a PCN reaction causing immediate rash, facial/tongue/throat swelling, SOB or lightheadedness with hypotension: Yes Has patient had a PCN reaction causing severe rash involving mucus membranes or skin necrosis: Yes Has patient had a PCN reaction that required hospitalization No Has patient had a PCN reaction occurring within the last 10 years: Yes If all of the above answers are "NO", then may proceed with Cephalosporin use.   Can take Cephalosporins   Prednisone Swelling    Just doesn't want to take it.    Consultations: Gastroenterology   Procedures/Studies: DG Chest Port 1 View  Result Date: 01/14/2021 CLINICAL DATA:  sob EXAM: PORTABLE CHEST 1 VIEW COMPARISON:  August 26, 2020.  FINDINGS: Patient rotation to the left. Streaky left basilar opacities, most likely atelectasis. No confluent consolidation. Possible small left pleural effusion versus pleural thickening. No visible pneumothorax. Similar cardiomediastinal silhouette. Aortic valve replacement and median sternotomy. Similar tortuosity of the aorta. Partially imaged left reverse shoulder arthroplasty. Thoracic dextrocurvature. Polyarticular degenerative change. IMPRESSION: 1. Streaky left basilar opacities, most likely atelectasis. 2. Possible small left pleural effusion versus pleural thickening. Electronically Signed   By: Margaretha Sheffield M.D.   On: 01/14/2021 14:36   (Echo, Carotid, EGD, Colonoscopy, ERCP)    Subjective: Patient was seen and examined.  Denies any complaints.  Left knee really bothers but a brace helps.   Discharge Exam: Vitals:   01/17/21 2006 01/18/21 0412  BP: (!) 92/50 (!) 102/56  Pulse: 71 72  Resp: 16   Temp: 98.1 F (36.7 C) 98.4 F (36.9 C)  SpO2: 96% 96%   Vitals:   01/17/21 0357 01/17/21 1301 01/17/21 2006 01/18/21 0412  BP: (!) 91/43 (!) 93/45 (!) 92/50 (!) 102/56  Pulse: 80 78 71 72  Resp: '16 18 16   '$ Temp: 98.1 F (36.7 C) 98.7 F (37.1 C) 98.1 F (36.7 C) 98.4 F (36.9 C)  TempSrc: Axillary Oral  Oral  SpO2: 97% 97% 96% 96%  Weight:      Height:        General: Pt is alert, awake, not in acute distress Frail and debilitated.  Chronically sick looking but not in any distress. Cardiovascular: RRR, S1/S2 +, no rubs, no gallops Patient has mechanical heart sounds most of the precordium. Respiratory: CTA bilaterally, no wheezing, no rhonchi Abdominal: Soft, NT, ND, bowel sounds + Extremities: no edema, no cyanosis  Deformed left knee with palpable crepitus and swelling that is chronic.   The results of significant diagnostics from this hospitalization (including imaging, microbiology, ancillary and laboratory) are listed below for reference.      Microbiology: Recent Results (from the past 240 hour(s))  Resp Panel by RT-PCR (Flu A&B, Covid) Nasopharyngeal Swab     Status: None   Collection Time: 01/14/21  5:26 PM   Specimen: Nasopharyngeal Swab; Nasopharyngeal(NP) swabs in vial transport medium  Result Value Ref Range Status   SARS Coronavirus 2 by RT PCR NEGATIVE NEGATIVE Final    Comment: (NOTE) SARS-CoV-2 target nucleic acids are NOT  DETECTED.  The SARS-CoV-2 RNA is generally detectable in upper respiratory specimens during the acute phase of infection. The lowest concentration of SARS-CoV-2 viral copies this assay can detect is 138 copies/mL. A negative result does not preclude SARS-Cov-2 infection and should not be used as the sole basis for treatment or other patient management decisions. A negative result may occur with  improper specimen collection/handling, submission of specimen other than nasopharyngeal swab, presence of viral mutation(s) within the areas targeted by this assay, and inadequate number of viral copies(<138 copies/mL). A negative result must be combined with clinical observations, patient history, and epidemiological information. The expected result is Negative.  Fact Sheet for Patients:  EntrepreneurPulse.com.au  Fact Sheet for Healthcare Providers:  IncredibleEmployment.be  This test is no t yet approved or cleared by the [place] FDA and  has been authorized for detection and/or diagnosis of SARS-CoV-2 by FDA under an Emergency Use Authorization (EUA). This EUA will remain  in effect (meaning this test can be used) for the duration of the COVID-19 declaration under Section 564(b)(1) of the Act, 21 U.S.C.section 360bbb-3(b)(1), unless the authorization is terminated  or revoked sooner.       Influenza A by PCR NEGATIVE NEGATIVE Final   Influenza B by PCR NEGATIVE NEGATIVE Final    Comment: (NOTE) The Xpert Xpress SARS-CoV-2/FLU/RSV plus assay is  intended as an aid in the diagnosis of influenza from Nasopharyngeal swab specimens and should not be used as a sole basis for treatment. Nasal washings and aspirates are unacceptable for Xpert Xpress SARS-CoV-2/FLU/RSV testing.  Fact Sheet for Patients: EntrepreneurPulse.com.au  Fact Sheet for Healthcare Providers: IncredibleEmployment.be  This test is not yet approved or cleared by the [place] FDA and has been authorized for detection and/or diagnosis of SARS-CoV-2 by FDA under an Emergency Use Authorization (EUA). This EUA will remain in effect (meaning this test can be used) for the duration of the COVID-19 declaration under Section 564(b)(1) of the Act, 21 U.S.C. section 360bbb-3(b)(1), unless the authorization is terminated or revoked.  Performed at [facility], [place] [address], [place], [place] [address]      Labs: BNP (last 3 results) Recent Labs    01/14/21 1357  BNP [id number]*   Basic Metabolic Panel: Recent Labs  Lab 01/14/21 1357 01/15/21 0842 01/16/21 0539  NA 138 138 142  K 3.4* 2.9* 3.8  CL 107 104 108  CO2 '22 26 24  '$ GLUCOSE 100* 107* 95  BUN 32* 26* 22  CREATININE 1.45* 1.52* 1.36*  CALCIUM 8.6* 8.7* 9.1  MG  --   --  2.0  PHOS  --   --  3.0   Liver Function Tests: No results for input(s): AST, ALT, ALKPHOS, BILITOT, PROT, ALBUMIN in the last 168 hours. No results for input(s): LIPASE, AMYLASE in the last 168 hours. No results for input(s): AMMONIA in the last 168 hours. CBC: Recent Labs  Lab 01/14/21 1357 01/14/21 1728 01/15/21 0842 01/16/21 0539 01/17/21 0244 01/18/21 0118  WBC 3.2*  --  2.8* 4.2 3.2* 2.9*  NEUTROABS 2.2  --   --  3.3 2.1 1.9  HGB 2.7* 2.7* 6.8* 8.3* 7.4* 7.5*  HCT 9.9* 10.2* 22.1* 25.5* 23.3* 23.8*  MCV 81.1  --  80.4 79.7* 81.8 83.5  PLT 87*  --  78* 83* 74* 70*   Cardiac Enzymes: No results for input(s): CKTOTAL, CKMB, CKMBINDEX, TROPONINI in the last 168  hours. BNP: Invalid input(s): POCBNP CBG: No results for input(s): GLUCAP in  the last 168 hours. D-Dimer No results for input(s): DDIMER in the last 72 hours. Hgb A1c No results for input(s): HGBA1C in the last 72 hours. Lipid Profile No results for input(s): CHOL, HDL, LDLCALC, TRIG, CHOLHDL, LDLDIRECT in the last 72 hours. Thyroid function studies No results for input(s): TSH, T4TOTAL, T3FREE, THYROIDAB in the last 72 hours.  Invalid input(s): FREET3 Anemia work up No results for input(s): VITAMINB12, FOLATE, FERRITIN, TIBC, IRON, RETICCTPCT in the last 72 hours.  Urinalysis    Component Value Date/Time   COLORURINE YELLOW 08/26/2020 1355   APPEARANCEUR Sl Cloudy (A) 08/26/2020 1355   LABSPEC 1.015 08/26/2020 1355   PHURINE 6.0 08/26/2020 1355   GLUCOSEU NEGATIVE 08/26/2020 1355   HGBUR TRACE-INTACT (A) 08/26/2020 1355   BILIRUBINUR NEGATIVE 08/26/2020 1355   KETONESUR NEGATIVE 08/26/2020 1355   PROTEINUR NEGATIVE 06/24/2016 1256   UROBILINOGEN 0.2 08/26/2020 1355   NITRITE NEGATIVE 08/26/2020 1355   LEUKOCYTESUR TRACE (A) 08/26/2020 1355   Sepsis Labs Invalid input(s): PROCALCITONIN,  WBC,  LACTICIDVEN Microbiology Recent Results (from the past 240 hour(s))  Resp Panel by RT-PCR (Flu A&B, Covid) Nasopharyngeal Swab     Status: None   Collection Time: 01/14/21  5:26 PM   Specimen: Nasopharyngeal Swab; Nasopharyngeal(NP) swabs in vial transport medium  Result Value Ref Range Status   SARS Coronavirus 2 by RT PCR NEGATIVE NEGATIVE Final    Comment: (NOTE) SARS-CoV-2 target nucleic acids are NOT DETECTED.  The SARS-CoV-2 RNA is generally detectable in upper respiratory specimens during the acute phase of infection. The lowest concentration of SARS-CoV-2 viral copies this assay can detect is 138 copies/mL. A negative result does not preclude SARS-Cov-2 infection and should not be used as the sole basis for treatment or other patient management decisions. A negative  result may occur with  improper specimen collection/handling, submission of specimen other than nasopharyngeal swab, presence of viral mutation(s) within the areas targeted by this assay, and inadequate number of viral copies(<138 copies/mL). A negative result must be combined with clinical observations, patient history, and epidemiological information. The expected result is Negative.  Fact Sheet for Patients:  EntrepreneurPulse.com.au  Fact Sheet for Healthcare Providers:  IncredibleEmployment.be  This test is no t yet approved or cleared by the [place] FDA and  has been authorized for detection and/or diagnosis of SARS-CoV-2 by FDA under an Emergency Use Authorization (EUA). This EUA will remain  in effect (meaning this test can be used) for the duration of the COVID-19 declaration under Section 564(b)(1) of the Act, 21 U.S.C.section 360bbb-3(b)(1), unless the authorization is terminated  or revoked sooner.       Influenza A by PCR NEGATIVE NEGATIVE Final   Influenza B by PCR NEGATIVE NEGATIVE Final    Comment: (NOTE) The Xpert Xpress SARS-CoV-2/FLU/RSV plus assay is intended as an aid in the diagnosis of influenza from Nasopharyngeal swab specimens and should not be used as a sole basis for treatment. Nasal washings and aspirates are unacceptable for Xpert Xpress SARS-CoV-2/FLU/RSV testing.  Fact Sheet for Patients: EntrepreneurPulse.com.au  Fact Sheet for Healthcare Providers: IncredibleEmployment.be  This test is not yet approved or cleared by the [place] FDA and has been authorized for detection and/or diagnosis of SARS-CoV-2 by FDA under an Emergency Use Authorization (EUA). This EUA will remain in effect (meaning this test can be used) for the duration of the COVID-19 declaration under Section 564(b)(1) of the Act, 21 U.S.C. section 360bbb-3(b)(1), unless the authorization is  terminated or revoked.  Performed at  [facility], [place] [address] [place], [place] [address]      Time coordinating discharge: 35 minutes  SIGNED:   Barb Merino, MD  [facility] 01/18/2021, 12:59 PM

## 2021-01-18 NOTE — Plan of Care (Signed)

## 2021-01-20 ENCOUNTER — Telehealth: Payer: Self-pay

## 2021-01-20 NOTE — Telephone Encounter (Signed)
Please advise as the pt has stated she was informed to call Dr.Plotnokov's office to inform him of her recent hospital stay. Pt states she had a biopsy done a/w receiving 4 units of blood, 1 of platelets, and 2 iron infusions. Pt states she had a GI bleed and also fluid around her heart. Her Hemoglobin was 2.7 at the time of the above was given.

## 2021-01-20 NOTE — Telephone Encounter (Signed)
Transition Care Management Follow-up Telephone Call Date of discharge and from where: 01/18/2021 from [facility] How have you been since you were released from the hospital? Very weak Any questions or concerns? No  Items Reviewed: Did the pt receive and understand the discharge instructions provided? Yes  Medications obtained and verified? Yes  Other? No  Any new allergies since your discharge? No  Dietary orders reviewed? No, Regular diet Do you have support at home? Yes , lives with daughter  Home Care and Equipment/Supplies: Were home health services ordered? no If so, what is the name of the agency? N/a  Has the agency set up a time to come to the patient's home? not applicable Were any new equipment or medical supplies ordered?  No What is the name of the medical supply agency? N/a Were you able to get the supplies/equipment? not applicable Do you have any questions related to the use of the equipment or supplies? No  Functional Questionnaire: (I = Independent and D = Dependent) ADLs: I  Bathing/Dressing- I  Meal Prep- D  Eating- I  Maintaining continence- I  Transferring/Ambulation- I; son has purchased a scooter  Managing Meds- I  Follow up appointments reviewed:  PCP Hospital f/u appt confirmed? Yes  Scheduled to see Lew Dawes, MD on 01/22/2021 @ 2:20 pm. [facility] f/u appt confirmed? No   Are transportation arrangements needed? No  If their condition worsens, is the pt aware to call PCP or go to the Emergency Dept.? Yes Was the patient provided with contact information for the PCP's office or ED? Yes Was to pt encouraged to call back with questions or concerns? Yes

## 2021-01-21 NOTE — Telephone Encounter (Signed)
F/u appt has been made for tomorrow 01/22/21.Marland KitchenJohny Chess

## 2021-01-21 NOTE — Telephone Encounter (Signed)
Noted.  I am sorry she was sick.  Thanks

## 2021-01-22 ENCOUNTER — Other Ambulatory Visit: Payer: Self-pay

## 2021-01-22 ENCOUNTER — Ambulatory Visit (INDEPENDENT_AMBULATORY_CARE_PROVIDER_SITE_OTHER): Payer: PPO | Admitting: Internal Medicine

## 2021-01-22 ENCOUNTER — Encounter: Payer: Self-pay | Admitting: Internal Medicine

## 2021-01-22 DIAGNOSIS — I712 Thoracic aortic aneurysm, without rupture, unspecified: Secondary | ICD-10-CM

## 2021-01-22 DIAGNOSIS — K922 Gastrointestinal hemorrhage, unspecified: Secondary | ICD-10-CM | POA: Diagnosis not present

## 2021-01-22 DIAGNOSIS — G894 Chronic pain syndrome: Secondary | ICD-10-CM

## 2021-01-22 DIAGNOSIS — N1831 Chronic kidney disease, stage 3a: Secondary | ICD-10-CM | POA: Diagnosis not present

## 2021-01-22 DIAGNOSIS — R627 Adult failure to thrive: Secondary | ICD-10-CM

## 2021-01-22 DIAGNOSIS — D649 Anemia, unspecified: Secondary | ICD-10-CM | POA: Diagnosis not present

## 2021-01-22 LAB — CBC WITH DIFFERENTIAL/PLATELET
Basophils Absolute: 0 10*3/uL (ref 0.0–0.1)
Basophils Relative: 1.1 % (ref 0.0–3.0)
Eosinophils Absolute: 0.2 10*3/uL (ref 0.0–0.7)
Eosinophils Relative: 6.1 % — ABNORMAL HIGH (ref 0.0–5.0)
HCT: 25.6 % — ABNORMAL LOW (ref 36.0–46.0)
Hemoglobin: 8.1 g/dL — ABNORMAL LOW (ref 12.0–15.0)
Lymphocytes Relative: 17.9 % (ref 12.0–46.0)
Lymphs Abs: 0.5 10*3/uL — ABNORMAL LOW (ref 0.7–4.0)
MCHC: 31.5 g/dL (ref 30.0–36.0)
MCV: 83.6 fl (ref 78.0–100.0)
Monocytes Absolute: 0.3 10*3/uL (ref 0.1–1.0)
Monocytes Relative: 9.7 % (ref 3.0–12.0)
Neutro Abs: 1.8 10*3/uL (ref 1.4–7.7)
Neutrophils Relative %: 65.2 % (ref 43.0–77.0)
Platelets: 100 10*3/uL — ABNORMAL LOW (ref 150.0–400.0)
RBC: 3.07 Mil/uL — ABNORMAL LOW (ref 3.87–5.11)
RDW: 18.6 % — ABNORMAL HIGH (ref 11.5–15.5)
WBC: 2.8 10*3/uL — ABNORMAL LOW (ref 4.0–10.5)

## 2021-01-22 LAB — COMPREHENSIVE METABOLIC PANEL
ALT: 6 U/L (ref 0–35)
AST: 14 U/L (ref 0–37)
Albumin: 3.2 g/dL — ABNORMAL LOW (ref 3.5–5.2)
Alkaline Phosphatase: 65 U/L (ref 39–117)
BUN: 26 mg/dL — ABNORMAL HIGH (ref 6–23)
CO2: 28 mEq/L (ref 19–32)
Calcium: 9.1 mg/dL (ref 8.4–10.5)
Chloride: 107 mEq/L (ref 96–112)
Creatinine, Ser: 1.04 mg/dL (ref 0.40–1.20)
GFR: 49.3 mL/min — ABNORMAL LOW (ref >60.00)
Glucose, Bld: 107 mg/dL — ABNORMAL HIGH (ref 70–99)
Potassium: 4.4 mEq/L (ref 3.5–5.1)
Sodium: 140 mEq/L (ref 135–145)
Total Bilirubin: 0.5 mg/dL (ref 0.2–1.2)
Total Protein: 6.4 g/dL (ref 6.0–8.3)

## 2021-01-22 MED ORDER — TRELEGY ELLIPTA 100-62.5-25 MCG/INH IN AEPB: INHALATION_SPRAY | RESPIRATORY_TRACT | 11 refills | Status: AC

## 2021-01-22 MED ORDER — TRIAMCINOLONE ACETONIDE 0.5 % EX CREA
1.0000 | TOPICAL_CREAM | Freq: Four times a day (QID) | CUTANEOUS | 1 refills | Status: AC
Start: 2021-01-22 — End: 2022-01-22

## 2021-01-22 NOTE — Patient Instructions (Signed)
Diane Abbott, your colon biopsies showed ischemic changes. This suggests that you had limited blood flow to your colon at some point. These changes can be due to low blood pressure and can go away with time. We can discuss further at your next clinic appointment with me. If you have symptoms such as abdominal pain, weight loss, or rectal bleeding, please let me know and we can arrange for an earlier appointment.

## 2021-01-22 NOTE — Assessment & Plan Note (Addendum)
Extensive hospital record/procedures/labs reviewed.   Hemoglobin dropped from 12-2.7 in 1 year.  Suspect acute on chronic GI bleed.  Currently hemodynamically stable. Received total 4 units of PRBC and 1 unit of plasma.   Hemoglobin responded from 2.7 -8.3 -7.4-7.5.  Given 1 unit of IV iron iron sucrose 300 mg. Will obtain CBC.

## 2021-01-22 NOTE — Progress Notes (Signed)
Subjective:  Patient ID: Diane Abbott, female    DOB: [birth year]  Age: 86 y.o. MRN: [id number]  CC: Follow-up (TCM HOSP F/U) and Shoulder Pain (RIGHT SHOULDER PAIN. Pt want cortisol injec)   HPI Diane Abbott presents for post-hosp f/u severe anemia due to lower GI bleeding, profound weakness, dyspnea She comes in with her friend and neighbor called helps to provide history. C/o R shoulder pain C/o weakness Pertinent history:  "Admit date: 01/14/2021 Discharge date: 01/18/2021   Admitted From: Home Disposition: Home   Recommendations for Outpatient Follow-up:  Follow up with PCP in 1-2 weeks Please obtain BMP/CBC in one week Please follow up on the following pending results: Sigmoid biopsy   Home Health: Not applicable Equipment/Devices: Not applicable   Discharge Condition: Stable CODE STATUS: Full code Diet recommendation: Regular diet.   Discharge summary: 86 year old female with extensive history of thoracic and abdominal aneurysm status postrepair x2, biosynthetic aortic valve replacement, CKD stage IIIb, hiatal hernia, chronic ambulatory dysfunction secondary left knee severe osteoarthritis on chronic narcotics presented with increasing shortness of breath, lethargy and weakness.  Lives at home with her daughter. She has been looking short winded for the last 2 weeks at least.  Patient does report noticing blackish stool last few weeks that smelled very bad as per her daughter. In the emergency room.  Hemodynamically stable.  Hemoglobin 2.7.  FOBT negative.   Severe symptomatic anemia: Anemia of acute blood loss.  Lower GI bleeding. Hemoglobin dropped from 12-2.7 in 1 year.  Suspect acute on chronic GI bleed.  Currently hemodynamically stable. Received total 4 units of PRBC and 1 unit of plasma.   Hemoglobin responded from 2.7 -8.3 -7.4-7.5.  Given 1 unit of IV iron iron sucrose 300 mg. No evidence of active bleeding.  Hemoglobin responded as anticipated.   Upper  GI endoscopy 9/14 with no active findings. Colonoscopy 9/15 with ulcerated sigmoid colon, biopsy taken.  Few AVMs and nonbleeding lesions. Given presence of some AVMs on colonoscopy, she could have some AVMs in her small intestine, however now stabilized. Avoid constipation.  Adequate laxatives.  Avoid NSAIDs and aspirin.  Biopsies pending.   Elevated troponins: Demand ischemia.  Flat.  No evidence of acute coronary syndrome.   History of multiple thoracic and abdominal aortic aneurysms status post multiple repairs: Has been stable.  Blood pressures are stable.  No evidence of complications.   CKD stage IIIb: At about usual levels.  Monitor.   Chronic ambulatory dysfunction secondary left knee osteoarthritis: Takes oxycodone and Mostly wheelchair-bound. Patient will avoid all NSAIDs.  If she continues to have pain on her oxycodone doses, she may need modification of therapy.  Local Voltaren gel to the left knee.   Please recheck hemoglobin in 1 week to ensure stabilization.  May start iron therapy then.   Discharge Diagnoses:  Active Problems:   Symptomatic anemia   Lower GI bleed"        Outpatient Medications Prior to Visit  Medication Sig Dispense Refill   amLODipine (NORVASC) 2.5 MG tablet Take 1 tablet (2.5 mg total) by mouth daily. 90 tablet 3   oxyCODONE (ROXICODONE) 5 MG immediate release tablet Take 1 tablet (5 mg total) by mouth 3 (three) times daily as needed for severe pain. 90 tablet 0   pantoprazole (PROTONIX) 40 MG tablet Take 1 tablet (40 mg total) by mouth daily. 90 tablet 1   rOPINIRole (REQUIP) 4 MG tablet TAKE 1 TABLET BY MOUTH 3 TIMES A DAY 270  tablet 3   Fluticasone-Umeclidin-Vilant (TRELEGY ELLIPTA) 100-62.5-25 MCG/INH AEPB 1 inh daily 1 each 11   lidocaine (LIDODERM) 5 % Place 1-2 patches onto the skin daily. Remove & Discard patch within 12 hours or as directed by MD (Patient not taking: No sig reported) 60 patch 11   No facility-administered medications  prior to visit.    ROS: Review of Systems  Constitutional:  Positive for fatigue. Negative for activity change, appetite change, chills and unexpected weight change.  HENT:  Negative for congestion, mouth sores and sinus pressure.   Eyes:  Negative for visual disturbance.  Respiratory:  Negative for cough and chest tightness.   Gastrointestinal:  Negative for abdominal pain, blood in stool, nausea, rectal pain and vomiting.  Genitourinary:  Negative for difficulty urinating, frequency and vaginal pain.  Musculoskeletal:  Positive for arthralgias, back pain, gait problem and neck stiffness.  Skin:  Negative for pallor and rash.  Neurological:  Positive for dizziness and weakness. Negative for tremors, numbness and headaches.  Psychiatric/Behavioral:  Negative for confusion, sleep disturbance and suicidal ideas. The patient is nervous/anxious.    Objective:  BP (!) 102/50 (BP Location: Left Arm)   Pulse 69   Temp 98.1 F (36.7 C) (Oral)   SpO2 97%   BP Readings from Last 3 Encounters:  01/22/21 (!) 102/50  01/18/21 (!) 110/54  12/09/20 140/72    Wt Readings from Last 3 Encounters:  01/14/21 121 lb (54.9 kg)  12/09/20 121 lb (54.9 kg)  11/21/20 121 lb 3.2 oz (55 kg)    Physical Exam Constitutional:      General: She is not in acute distress.    Appearance: She is well-developed. She is ill-appearing.  HENT:     Head: Normocephalic.     Right Ear: External ear normal.     Left Ear: External ear normal.     Nose: Nose normal.  Eyes:     General:        Right eye: No discharge.        Left eye: No discharge.     Conjunctiva/sclera: Conjunctivae normal.     Pupils: Pupils are equal, round, and reactive to light.  Neck:     Thyroid: No thyromegaly.     Vascular: No JVD.     Trachea: No tracheal deviation.  Cardiovascular:     Rate and Rhythm: Normal rate and regular rhythm.     Heart sounds: Murmur heard.  Pulmonary:     Effort: No respiratory distress.     Breath  sounds: No stridor. No wheezing.  Abdominal:     General: Bowel sounds are normal. There is no distension.     Palpations: Abdomen is soft. There is no mass.     Tenderness: There is no abdominal tenderness. There is no guarding or rebound.  Musculoskeletal:        General: No tenderness.     Cervical back: Normal range of motion and neck supple.  Lymphadenopathy:     Cervical: No cervical adenopathy.  Skin:    Findings: No erythema or rash.  Neurological:     Mental Status: She is oriented to person, place, and time.     Cranial Nerves: No cranial nerve deficit.     Motor: Weakness present. No abnormal muscle tone.     Coordination: Coordination normal.     Gait: Gait abnormal.     Deep Tendon Reflexes: Reflexes normal.  Psychiatric:        Behavior: Behavior  normal.        Thought Content: Thought content normal.        Judgment: Judgment normal.  The patient appears chronically ill She is somewhat pale She is in a wheelchair  Lab Results  Component Value Date   WBC 2.8 (L) 01/22/2021   HGB 8.1 Repeated and verified X2. (L) 01/22/2021   HCT 25.6 Repeated and verified X2. (L) 01/22/2021   PLT 100.0 (L) 01/22/2021   GLUCOSE 107 (H) 01/22/2021   ALT 6 01/22/2021   AST 14 01/22/2021   NA 140 01/22/2021   K 4.4 01/22/2021   CL 107 01/22/2021   CREATININE 1.04 01/22/2021   BUN 26 (H) 01/22/2021   CO2 28 01/22/2021   TSH 1.09 08/26/2020   INR 1.3 (H) 01/14/2021   HGBA1C 5.5 06/24/2016    DG Chest Port 1 View  Result Date: 01/14/2021 CLINICAL DATA:  sob EXAM: PORTABLE CHEST 1 VIEW COMPARISON:  August 26, 2020. FINDINGS: Patient rotation to the left. Streaky left basilar opacities, most likely atelectasis. No confluent consolidation. Possible small left pleural effusion versus pleural thickening. No visible pneumothorax. Similar cardiomediastinal silhouette. Aortic valve replacement and median sternotomy. Similar tortuosity of the aorta. Partially imaged left reverse shoulder  arthroplasty. Thoracic dextrocurvature. Polyarticular degenerative change. IMPRESSION: 1. Streaky left basilar opacities, most likely atelectasis. 2. Possible small left pleural effusion versus pleural thickening. Electronically Signed   By: Margaretha Sheffield M.D.   On: 01/14/2021 14:36    Assessment & Plan:   Problem List Items Addressed This Visit     Aortic aneurysm, thoracic ([place])    Reviewed with patient.  Inoperable condition.      Chronic pain    Continue with low-dose oxycodone as needed  Potential benefits of a long term opioids use as well as potential risks (i.e. addiction risk, apnea etc) and complications (i.e. Somnolence, constipation and others) were explained to the patient and were aknowledged.      Chronic renal insufficiency, stage III (moderate) (HCC)    Continue with Abbott hydration.  Monitoring GFR      Failure to thrive in adult    Multifactorial.  The patient was instructed to drink Ensure Plus 1 or 2 a day.      Lower GI bleed    Monitor for symptoms of lower GI bleed.  No bleeding at present      Symptomatic anemia    Extensive hospital record/procedures/labs reviewed.   Hemoglobin dropped from 12-2.7 in 1 year.  Suspect acute on chronic GI bleed.  Currently hemodynamically stable. Received total 4 units of PRBC and 1 unit of plasma.   Hemoglobin responded from 2.7 -8.3 -7.4-7.5.  Given 1 unit of IV iron iron sucrose 300 mg. Will obtain CBC.       Relevant Orders   CBC with Differential/Platelet (Completed)   Comprehensive metabolic panel (Completed)      Follow-up: Return in about 2 weeks (around 02/05/2021) for a follow-up visit.  Walker Kehr, MD

## 2021-01-30 ENCOUNTER — Encounter: Payer: Self-pay | Admitting: Internal Medicine

## 2021-01-30 NOTE — Assessment & Plan Note (Signed)
Reviewed with patient.  Inoperable condition.

## 2021-01-30 NOTE — Assessment & Plan Note (Signed)
Monitor for symptoms of lower GI bleed.  No bleeding at present

## 2021-01-30 NOTE — Assessment & Plan Note (Signed)
Continue with good hydration.  Monitoring GFR

## 2021-01-30 NOTE — Assessment & Plan Note (Signed)
Multifactorial.  The patient was instructed to drink Ensure Plus 1 or 2 a day.

## 2021-01-30 NOTE — Assessment & Plan Note (Signed)
Continue with low-dose oxycodone as needed  Potential benefits of a long term opioids use as well as potential risks (i.e. addiction risk, apnea etc) and complications (i.e. Somnolence, constipation and others) were explained to the patient and were aknowledged.

## 2021-02-11 ENCOUNTER — Ambulatory Visit (INDEPENDENT_AMBULATORY_CARE_PROVIDER_SITE_OTHER): Payer: PPO

## 2021-02-11 ENCOUNTER — Other Ambulatory Visit: Payer: Self-pay

## 2021-02-11 ENCOUNTER — Ambulatory Visit (INDEPENDENT_AMBULATORY_CARE_PROVIDER_SITE_OTHER): Payer: PPO | Admitting: Internal Medicine

## 2021-02-11 ENCOUNTER — Encounter: Payer: Self-pay | Admitting: Internal Medicine

## 2021-02-11 DIAGNOSIS — M79672 Pain in left foot: Secondary | ICD-10-CM

## 2021-02-11 DIAGNOSIS — M7989 Other specified soft tissue disorders: Secondary | ICD-10-CM | POA: Diagnosis not present

## 2021-02-11 DIAGNOSIS — I959 Hypotension, unspecified: Secondary | ICD-10-CM | POA: Insufficient documentation

## 2021-02-11 DIAGNOSIS — I9589 Other hypotension: Secondary | ICD-10-CM | POA: Diagnosis not present

## 2021-02-11 DIAGNOSIS — R634 Abnormal weight loss: Secondary | ICD-10-CM | POA: Diagnosis not present

## 2021-02-11 DIAGNOSIS — E861 Hypovolemia: Secondary | ICD-10-CM

## 2021-02-11 DIAGNOSIS — R600 Localized edema: Secondary | ICD-10-CM | POA: Diagnosis not present

## 2021-02-11 DIAGNOSIS — D649 Anemia, unspecified: Secondary | ICD-10-CM | POA: Diagnosis not present

## 2021-02-11 LAB — CBC WITH DIFFERENTIAL/PLATELET
Basophils Absolute: 0 10*3/uL (ref 0.0–0.1)
Basophils Relative: 1.2 % (ref 0.0–3.0)
Eosinophils Absolute: 0.1 10*3/uL (ref 0.0–0.7)
Eosinophils Relative: 4.2 % (ref 0.0–5.0)
HCT: 29.3 % — ABNORMAL LOW (ref 36.0–46.0)
Hemoglobin: 9.4 g/dL — ABNORMAL LOW (ref 12.0–15.0)
Lymphocytes Relative: 25.1 % (ref 12.0–46.0)
Lymphs Abs: 0.8 10*3/uL (ref 0.7–4.0)
MCHC: 32.2 g/dL (ref 30.0–36.0)
MCV: 88.5 fl (ref 78.0–100.0)
Monocytes Absolute: 0.3 10*3/uL (ref 0.1–1.0)
Monocytes Relative: 8.7 % (ref 3.0–12.0)
Neutro Abs: 2 10*3/uL (ref 1.4–7.7)
Neutrophils Relative %: 60.8 % (ref 43.0–77.0)
Platelets: 100 10*3/uL — ABNORMAL LOW (ref 150.0–400.0)
RBC: 3.31 Mil/uL — ABNORMAL LOW (ref 3.87–5.11)
RDW: 23.6 % — ABNORMAL HIGH (ref 11.5–15.5)
WBC: 3.3 10*3/uL — ABNORMAL LOW (ref 4.0–10.5)

## 2021-02-11 LAB — COMPREHENSIVE METABOLIC PANEL
ALT: 6 U/L (ref 0–35)
AST: 16 U/L (ref 0–37)
Albumin: 3.9 g/dL (ref 3.5–5.2)
Alkaline Phosphatase: 74 U/L (ref 39–117)
BUN: 30 mg/dL — ABNORMAL HIGH (ref 6–23)
CO2: 27 mEq/L (ref 19–32)
Calcium: 10.1 mg/dL (ref 8.4–10.5)
Chloride: 105 mEq/L (ref 96–112)
Creatinine, Ser: 1.06 mg/dL (ref 0.40–1.20)
GFR: 48.17 mL/min — ABNORMAL LOW (ref >60.00)
Glucose, Bld: 97 mg/dL (ref 70–99)
Potassium: 4.3 mEq/L (ref 3.5–5.1)
Sodium: 140 mEq/L (ref 135–145)
Total Bilirubin: 0.7 mg/dL (ref 0.2–1.2)
Total Protein: 7.3 g/dL (ref 6.0–8.3)

## 2021-02-11 LAB — CORTISOL: Cortisol, Plasma: 14.2 ug/dL

## 2021-02-11 NOTE — Assessment & Plan Note (Signed)
New. S/p fall 10 d ago. L ankle is deformed and twisted in, swelling, pain. R/o fx.  X rays ordered ACE wrap

## 2021-02-11 NOTE — Assessment & Plan Note (Signed)
Check CBC 

## 2021-02-11 NOTE — Patient Instructions (Signed)
Hold Amlodipine

## 2021-02-11 NOTE — Progress Notes (Signed)
Subjective:  Patient ID: Diane Abbott, female    DOB: [birth year]  Age: 86 y.o. MRN: [id number]  CC: Follow-up (2 week f/u) and Foot Swelling ((L) foot)   HPI Diane Abbott presents for a fall 10 d ago, twisted L ankle. C/o L foot hurting, can't put wt on it... F/u anemia  due to lower GI bleeding, profound weakness, dyspnea She comes in with her friend and neighbor.   Outpatient Medications Prior to Visit  Medication Sig Dispense Refill   amLODipine (NORVASC) 2.5 MG tablet Take 1 tablet (2.5 mg total) by mouth daily. 90 tablet 3   Fluticasone-Umeclidin-Vilant (TRELEGY ELLIPTA) 100-62.5-25 MCG/INH AEPB 1 inh daily 1 each 11   oxyCODONE (ROXICODONE) 5 MG immediate release tablet Take 1 tablet (5 mg total) by mouth 3 (three) times daily as needed for severe pain. 90 tablet 0   pantoprazole (PROTONIX) 40 MG tablet Take 1 tablet (40 mg total) by mouth daily. 90 tablet 1   rOPINIRole (REQUIP) 4 MG tablet TAKE 1 TABLET BY MOUTH 3 TIMES A DAY 270 tablet 3   triamcinolone cream (KENALOG) 0.5 % Apply 1 application topically 4 (four) times daily. 45 g 1   No facility-administered medications prior to visit.    ROS: Review of Systems  Constitutional:  Positive for fatigue. Negative for activity change, appetite change, chills, fever and unexpected weight change.  HENT:  Negative for congestion, mouth sores and sinus pressure.   Eyes:  Negative for visual disturbance.  Respiratory:  Negative for cough and chest tightness.   Gastrointestinal:  Negative for abdominal pain and nausea.  Genitourinary:  Negative for difficulty urinating, frequency and vaginal pain.  Musculoskeletal:  Positive for arthralgias, back pain and gait problem.  Skin:  Negative for pallor and rash.  Neurological:  Positive for dizziness and weakness. Negative for tremors, numbness and headaches.  Psychiatric/Behavioral:  Negative for confusion and sleep disturbance. The patient is nervous/anxious.    Objective:   BP 120/62 (BP Location: Left Arm)   Pulse 73   Temp 98 F (36.7 C) (Oral)   SpO2 98%   BP Readings from Last 3 Encounters:  02/11/21 120/62  01/22/21 (!) 102/50  01/18/21 (!) 110/54    Wt Readings from Last 3 Encounters:  01/14/21 121 lb (54.9 kg)  12/09/20 121 lb (54.9 kg)  11/21/20 121 lb 3.2 oz (55 kg)    Physical Exam Constitutional:      General: She is not in acute distress.    Appearance: She is well-developed. She is not toxic-appearing.  HENT:     Head: Normocephalic.     Right Ear: External ear normal.     Left Ear: External ear normal.     Nose: Nose normal.  Eyes:     General:        Right eye: No discharge.        Left eye: No discharge.     Conjunctiva/sclera: Conjunctivae normal.     Pupils: Pupils are equal, round, and reactive to light.  Neck:     Thyroid: No thyromegaly.     Vascular: No JVD.     Trachea: No tracheal deviation.  Cardiovascular:     Rate and Rhythm: Normal rate and regular rhythm.     Heart sounds: Normal heart sounds.  Pulmonary:     Effort: No respiratory distress.     Breath sounds: No stridor. No wheezing.  Abdominal:     General: Bowel sounds are normal. There is  no distension.     Palpations: Abdomen is soft. There is no mass.     Tenderness: There is no abdominal tenderness. There is no guarding or rebound.  Musculoskeletal:        General: Tenderness present.     Cervical back: Normal range of motion and neck supple.  Lymphadenopathy:     Cervical: No cervical adenopathy.  Skin:    Findings: No erythema or rash.  Neurological:     Mental Status: She is alert and oriented to person, place, and time.     Cranial Nerves: No cranial nerve deficit.     Motor: Weakness present. No abnormal muscle tone.     Coordination: Coordination normal.     Gait: Gait abnormal.     Deep Tendon Reflexes: Reflexes normal.  Psychiatric:        Behavior: Behavior normal.        Thought Content: Thought content normal.         Judgment: Judgment normal.   In a w/c L ankle is deformed and twisted in, swelling, pain  Lab Results  Component Value Date   WBC 2.8 (L) 01/22/2021   HGB 8.1 Repeated and verified X2. (L) 01/22/2021   HCT 25.6 Repeated and verified X2. (L) 01/22/2021   PLT 100.0 (L) 01/22/2021   GLUCOSE 107 (H) 01/22/2021   ALT 6 01/22/2021   AST 14 01/22/2021   NA 140 01/22/2021   K 4.4 01/22/2021   CL 107 01/22/2021   CREATININE 1.04 01/22/2021   BUN 26 (H) 01/22/2021   CO2 28 01/22/2021   TSH 1.09 08/26/2020   INR 1.3 (H) 01/14/2021   HGBA1C 5.5 06/24/2016    DG Chest Port 1 View  Result Date: 01/14/2021 CLINICAL DATA:  sob EXAM: PORTABLE CHEST 1 VIEW COMPARISON:  August 26, 2020. FINDINGS: Patient rotation to the left. Streaky left basilar opacities, most likely atelectasis. No confluent consolidation. Possible small left pleural effusion versus pleural thickening. No visible pneumothorax. Similar cardiomediastinal silhouette. Aortic valve replacement and median sternotomy. Similar tortuosity of the aorta. Partially imaged left reverse shoulder arthroplasty. Thoracic dextrocurvature. Polyarticular degenerative change. IMPRESSION: 1. Streaky left basilar opacities, most likely atelectasis. 2. Possible small left pleural effusion versus pleural thickening. Electronically Signed   By: Margaretha Sheffield M.D.   On: 01/14/2021 14:36    Assessment & Plan:   Problem List Items Addressed This Visit     Foot pain, left    New. S/p fall 10 d ago. L ankle is deformed and twisted in, swelling, pain. R/o fx.  X rays ordered ACE wrap      Relevant Orders   DG Foot 2 Views Left   DG Ankle Complete Left   Hypotension    Hold Amlodipine Consider Hydrocortisone po Check CBC, CMET, cortisol      Relevant Orders   CBC with Differential/Platelet   Comprehensive metabolic panel   Cortisol   Symptomatic anemia    Check CBC      Relevant Orders   CBC with Differential/Platelet   Comprehensive  metabolic panel   Cortisol   Weight loss    Wt Readings from Last 3 Encounters:  01/14/21 121 lb (54.9 kg)  12/09/20 121 lb (54.9 kg)  11/21/20 121 lb 3.2 oz (55 kg)        Relevant Orders   CBC with Differential/Platelet   Comprehensive metabolic panel   Cortisol      Follow-up: Return in about 6 weeks (around 03/25/2021) for a  follow-up visit.  Walker Kehr, MD

## 2021-02-11 NOTE — Addendum Note (Signed)
Addended by: Boris Lown B on: 02/11/2021 03:18 PM   Modules accepted: Orders

## 2021-02-11 NOTE — Assessment & Plan Note (Signed)
Wt Readings from Last 3 Encounters:  01/14/21 121 lb (54.9 kg)  12/09/20 121 lb (54.9 kg)  11/21/20 121 lb 3.2 oz (55 kg)

## 2021-02-11 NOTE — Assessment & Plan Note (Signed)
Hold Amlodipine Consider Hydrocortisone po Check CBC, CMET, cortisol

## 2021-03-25 ENCOUNTER — Other Ambulatory Visit: Payer: Self-pay | Admitting: Internal Medicine

## 2021-04-04 ENCOUNTER — Ambulatory Visit (INDEPENDENT_AMBULATORY_CARE_PROVIDER_SITE_OTHER): Payer: PPO | Admitting: Internal Medicine

## 2021-04-04 ENCOUNTER — Other Ambulatory Visit: Payer: Self-pay

## 2021-04-04 ENCOUNTER — Encounter: Payer: Self-pay | Admitting: Internal Medicine

## 2021-04-04 VITALS — BP 140/72 | HR 76 | Temp 97.9°F | Ht 60.0 in | Wt 126.0 lb

## 2021-04-04 DIAGNOSIS — G8929 Other chronic pain: Secondary | ICD-10-CM | POA: Diagnosis not present

## 2021-04-04 DIAGNOSIS — G894 Chronic pain syndrome: Secondary | ICD-10-CM

## 2021-04-04 DIAGNOSIS — R634 Abnormal weight loss: Secondary | ICD-10-CM

## 2021-04-04 DIAGNOSIS — J189 Pneumonia, unspecified organism: Secondary | ICD-10-CM | POA: Diagnosis not present

## 2021-04-04 DIAGNOSIS — E861 Hypovolemia: Secondary | ICD-10-CM

## 2021-04-04 DIAGNOSIS — K922 Gastrointestinal hemorrhage, unspecified: Secondary | ICD-10-CM | POA: Diagnosis not present

## 2021-04-04 DIAGNOSIS — M25562 Pain in left knee: Secondary | ICD-10-CM

## 2021-04-04 DIAGNOSIS — I7123 Aneurysm of the descending thoracic aorta, without rupture: Secondary | ICD-10-CM | POA: Diagnosis not present

## 2021-04-04 DIAGNOSIS — I9589 Other hypotension: Secondary | ICD-10-CM

## 2021-04-04 DIAGNOSIS — D509 Iron deficiency anemia, unspecified: Secondary | ICD-10-CM

## 2021-04-04 DIAGNOSIS — M79672 Pain in left foot: Secondary | ICD-10-CM | POA: Diagnosis not present

## 2021-04-04 LAB — CBC WITH DIFFERENTIAL/PLATELET
Basophils Absolute: 0 10*3/uL (ref 0.0–0.1)
Basophils Relative: 1 % (ref 0.0–3.0)
Eosinophils Absolute: 0.1 10*3/uL (ref 0.0–0.7)
Eosinophils Relative: 3.1 % (ref 0.0–5.0)
HCT: 32.2 % — ABNORMAL LOW (ref 36.0–46.0)
Hemoglobin: 10.6 g/dL — ABNORMAL LOW (ref 12.0–15.0)
Lymphocytes Relative: 18.4 % (ref 12.0–46.0)
Lymphs Abs: 0.7 10*3/uL (ref 0.7–4.0)
MCHC: 32.8 g/dL (ref 30.0–36.0)
MCV: 87.8 fl (ref 78.0–100.0)
Monocytes Absolute: 0.3 10*3/uL (ref 0.1–1.0)
Monocytes Relative: 8.5 % (ref 3.0–12.0)
Neutro Abs: 2.7 10*3/uL (ref 1.4–7.7)
Neutrophils Relative %: 69 % (ref 43.0–77.0)
Platelets: 120 10*3/uL — ABNORMAL LOW (ref 150.0–400.0)
RBC: 3.67 Mil/uL — ABNORMAL LOW (ref 3.87–5.11)
RDW: 16.3 % — ABNORMAL HIGH (ref 11.5–15.5)
WBC: 4 10*3/uL (ref 4.0–10.5)

## 2021-04-04 LAB — COMPREHENSIVE METABOLIC PANEL
ALT: 9 U/L (ref 0–35)
AST: 15 U/L (ref 0–37)
Albumin: 4.1 g/dL (ref 3.5–5.2)
Alkaline Phosphatase: 86 U/L (ref 39–117)
BUN: 33 mg/dL — ABNORMAL HIGH (ref 6–23)
CO2: 26 mEq/L (ref 19–32)
Calcium: 10 mg/dL (ref 8.4–10.5)
Chloride: 103 mEq/L (ref 96–112)
Creatinine, Ser: 1.16 mg/dL (ref 0.40–1.20)
GFR: 43.18 mL/min — ABNORMAL LOW (ref >60.00)
Glucose, Bld: 93 mg/dL (ref 70–99)
Potassium: 3.7 mEq/L (ref 3.5–5.1)
Sodium: 138 mEq/L (ref 135–145)
Total Bilirubin: 0.5 mg/dL (ref 0.2–1.2)
Total Protein: 7.9 g/dL (ref 6.0–8.3)

## 2021-04-04 MED ORDER — DOXYCYCLINE HYCLATE 100 MG PO CAPS: 100.0000 mg | ORAL_CAPSULE | Freq: Two times a day (BID) | ORAL | 1 refills | Status: AC

## 2021-04-04 MED ORDER — IPRATROPIUM BROMIDE 0.06 % NA SOLN: 2.0000 | Freq: Three times a day (TID) | NASAL | 2 refills | Status: AC

## 2021-04-04 MED ORDER — FLUCONAZOLE 100 MG PO TABS: ORAL_TABLET | ORAL | 1 refills | Status: DC

## 2021-04-04 NOTE — Assessment & Plan Note (Signed)
Worse.  The patient stopped takin worse.  G oxycodone since it is making her sleepy and weak. Orthopedic surgery consultation.

## 2021-04-04 NOTE — Assessment & Plan Note (Signed)
Wt Readings from Last 3 Encounters:  04/04/21 126 lb (57.2 kg)  01/14/21 121 lb (54.9 kg)  12/09/20 121 lb (54.9 kg)  A little better

## 2021-04-04 NOTE — Assessment & Plan Note (Signed)
No relapse.  Check CBC and iron

## 2021-04-04 NOTE — Progress Notes (Signed)
Subjective:  Patient ID: Diane Abbott, female    DOB: [birth year]  Age: 86 y.o. MRN: [id number]  CC: Follow-up (6 week f/u)   HPI [name] presents for L foot and ankle pain, HTN, anemia.  She is a little stronger.  The pain in both knees and left foot/ankle is severe, unable to bear weight.  She is able to transfer to and from her wheelchair at home without assistance.  The patient is here with her friend C/o weakness, no syncope C/o sinusitis -worsening symptoms over the week or 2.  Outpatient Medications Prior to Visit  Medication Sig Dispense Refill   amLODipine (NORVASC) 2.5 MG tablet TAKE 1 TABLET BY MOUTH EVERY DAY 90 tablet 3   Fluticasone-Umeclidin-Vilant (TRELEGY ELLIPTA) 100-62.5-25 MCG/INH AEPB 1 inh daily 1 each 11   oxyCODONE (ROXICODONE) 5 MG immediate release tablet Take 1 tablet (5 mg total) by mouth 3 (three) times daily as needed for severe pain. 90 tablet 0   pantoprazole (PROTONIX) 40 MG tablet Take 1 tablet (40 mg total) by mouth daily. 90 tablet 1   rOPINIRole (REQUIP) 4 MG tablet TAKE 1 TABLET BY MOUTH 3 TIMES A DAY 270 tablet 3   triamcinolone cream (KENALOG) 0.5 % Apply 1 application topically 4 (four) times daily. 45 g 1   No facility-administered medications prior to visit.    ROS: Review of Systems  Constitutional:  Positive for fatigue. Negative for activity change, appetite change, chills and unexpected weight change.  HENT:  Positive for congestion, postnasal drip and rhinorrhea. Negative for mouth sores and sinus pressure.   Eyes:  Negative for visual disturbance.  Respiratory:  Negative for cough and chest tightness.   Gastrointestinal:  Negative for abdominal pain and nausea.  Genitourinary:  Negative for difficulty urinating, frequency and vaginal pain.  Musculoskeletal:  Positive for arthralgias, back pain and gait problem.  Skin:  Negative for pallor and rash.  Neurological:  Negative for dizziness, tremors, weakness, numbness and  headaches.  Psychiatric/Behavioral:  Negative for confusion, sleep disturbance and suicidal ideas. The patient is nervous/anxious.    Objective:  BP 140/72 (BP Location: Left Arm)   Pulse 76   Temp 97.9 F (36.6 C) (Oral)   Ht 5' (1.524 m)   Wt 126 lb (57.2 kg)   SpO2 97%   BMI 24.61 kg/m   BP Readings from Last 3 Encounters:  04/04/21 140/72  02/11/21 120/62  01/22/21 (!) 102/50    Wt Readings from Last 3 Encounters:  04/04/21 126 lb (57.2 kg)  01/14/21 121 lb (54.9 kg)  12/09/20 121 lb (54.9 kg)    Physical Exam Constitutional:      General: She is not in acute distress.    Appearance: She is well-developed.  HENT:     Head: Normocephalic.     Right Ear: External ear normal.     Left Ear: External ear normal.     Nose: Nose normal.  Eyes:     General:        Right eye: No discharge.        Left eye: No discharge.     Conjunctiva/sclera: Conjunctivae normal.     Pupils: Pupils are equal, round, and reactive to light.  Neck:     Thyroid: No thyromegaly.     Vascular: No JVD.     Trachea: No tracheal deviation.  Cardiovascular:     Rate and Rhythm: Normal rate and regular rhythm.     Heart sounds: Normal  heart sounds.  Pulmonary:     Effort: No respiratory distress.     Breath sounds: No stridor. No wheezing.  Abdominal:     General: Bowel sounds are normal. There is no distension.     Palpations: Abdomen is soft. There is no mass.     Tenderness: There is no abdominal tenderness. There is no guarding or rebound.  Musculoskeletal:        General: No tenderness.     Cervical back: Normal range of motion and neck supple. No rigidity.  Lymphadenopathy:     Cervical: No cervical adenopathy.  Skin:    Findings: No erythema or rash.  Neurological:     Mental Status: She is oriented to person, place, and time.     Cranial Nerves: No cranial nerve deficit.     Motor: Weakness present. No abnormal muscle tone.     Coordination: Coordination abnormal.     Gait:  Gait abnormal.     Deep Tendon Reflexes: Reflexes normal.  Psychiatric:        Behavior: Behavior normal.        Thought Content: Thought content normal.        Judgment: Judgment normal.  In a w/c  B knees-painful with range of motion Left ankle and proximal foot are painful range of motion.  Edema is better  Lab Results  Component Value Date   WBC 3.3 (L) 02/11/2021   HGB 9.4 (L) 02/11/2021   HCT 29.3 (L) 02/11/2021   PLT 100.0 (L) 02/11/2021   GLUCOSE 97 02/11/2021   ALT 6 02/11/2021   AST 16 02/11/2021   NA 140 02/11/2021   K 4.3 02/11/2021   CL 105 02/11/2021   CREATININE 1.06 02/11/2021   BUN 30 (H) 02/11/2021   CO2 27 02/11/2021   TSH 1.09 08/26/2020   INR 1.3 (H) 01/14/2021   HGBA1C 5.5 06/24/2016    DG Chest Port 1 View  Result Date: 01/14/2021 CLINICAL DATA:  sob EXAM: PORTABLE CHEST 1 VIEW COMPARISON:  August 26, 2020. FINDINGS: Patient rotation to the left. Streaky left basilar opacities, most likely atelectasis. No confluent consolidation. Possible small left pleural effusion versus pleural thickening. No visible pneumothorax. Similar cardiomediastinal silhouette. Aortic valve replacement and median sternotomy. Similar tortuosity of the aorta. Partially imaged left reverse shoulder arthroplasty. Thoracic dextrocurvature. Polyarticular degenerative change. IMPRESSION: 1. Streaky left basilar opacities, most likely atelectasis. 2. Possible small left pleural effusion versus pleural thickening. Electronically Signed   By: Margaretha Sheffield M.D.   On: 01/14/2021 14:36    Assessment & Plan:   Problem List Items Addressed This Visit     Aortic aneurysm, thoracic - Primary   Foot pain, left   Relevant Orders   Ambulatory referral to Orthopedic Surgery   Other Visit Diagnoses     Iron deficiency anemia, unspecified iron deficiency anemia type       Relevant Orders   CBC with Differential/Platelet   Iron, TIBC and Ferritin Panel   Comprehensive metabolic panel    Pneumonia, organism unspecified(486)             Meds ordered this encounter  Medications   ipratropium (ATROVENT) 0.06 % nasal spray    Sig: Place 2 sprays into the nose 3 (three) times daily.    Dispense:  15 mL    Refill:  2   doxycycline (VIBRAMYCIN) 100 MG capsule    Sig: Take 1 capsule (100 mg total) by mouth 2 (two) times daily for 7  days.    Dispense:  20 capsule    Refill:  1   fluconazole (DIFLUCAN) 100 MG tablet    Sig: Take 2 tabs on day#1, then 1 tab daily on Days #2-10    Dispense:  11 tablet    Refill:  1      Follow-up: No follow-ups on file.  Walker Kehr, MD

## 2021-04-04 NOTE — Assessment & Plan Note (Signed)
Better, since her anemia was treated Obtain CBC, c-Met

## 2021-04-04 NOTE — Assessment & Plan Note (Signed)
The patient stopped taking oxycodone since it is making her sleepy and weak.  The pain is not much better. Orthopedic surgery consultation.

## 2021-04-04 NOTE — Assessment & Plan Note (Signed)
Worse.  The patient stopped taking oxycodone since it is making her sleepy and weak. Orthopedic surgery consultation.

## 2021-04-05 LAB — IRON,TIBC AND FERRITIN PANEL
%SAT: 10 % (calc) — ABNORMAL LOW (ref 16–45)
Ferritin: 24 ng/mL (ref 16–288)
Iron: 43 ug/dL — ABNORMAL LOW (ref 45–160)
TIBC: 442 mcg/dL (calc) (ref 250–450)

## 2021-04-07 ENCOUNTER — Ambulatory Visit: Payer: PPO | Admitting: Internal Medicine

## 2021-05-12 ENCOUNTER — Ambulatory Visit: Payer: PPO | Admitting: Internal Medicine

## 2021-05-15 ENCOUNTER — Ambulatory Visit (INDEPENDENT_AMBULATORY_CARE_PROVIDER_SITE_OTHER): Payer: PPO | Admitting: Internal Medicine

## 2021-05-15 ENCOUNTER — Other Ambulatory Visit: Payer: Self-pay

## 2021-05-15 ENCOUNTER — Encounter: Payer: Self-pay | Admitting: Internal Medicine

## 2021-05-15 ENCOUNTER — Ambulatory Visit: Payer: PPO | Admitting: Internal Medicine

## 2021-05-15 DIAGNOSIS — G894 Chronic pain syndrome: Secondary | ICD-10-CM | POA: Diagnosis not present

## 2021-05-15 DIAGNOSIS — R634 Abnormal weight loss: Secondary | ICD-10-CM | POA: Diagnosis not present

## 2021-05-15 DIAGNOSIS — I700 Atherosclerosis of aorta: Secondary | ICD-10-CM

## 2021-05-15 DIAGNOSIS — D649 Anemia, unspecified: Secondary | ICD-10-CM

## 2021-05-15 DIAGNOSIS — E861 Hypovolemia: Secondary | ICD-10-CM

## 2021-05-15 DIAGNOSIS — I9589 Other hypotension: Secondary | ICD-10-CM

## 2021-05-15 LAB — COMPREHENSIVE METABOLIC PANEL
ALT: 9 U/L (ref 0–35)
AST: 17 U/L (ref 0–37)
Albumin: 3.9 g/dL (ref 3.5–5.2)
Alkaline Phosphatase: 82 U/L (ref 39–117)
BUN: 35 mg/dL — ABNORMAL HIGH (ref 6–23)
CO2: 29 mEq/L (ref 19–32)
Calcium: 9.7 mg/dL (ref 8.4–10.5)
Chloride: 103 mEq/L (ref 96–112)
Creatinine, Ser: 1.36 mg/dL — ABNORMAL HIGH (ref 0.40–1.20)
GFR: 35.65 mL/min — ABNORMAL LOW (ref >60.00)
Glucose, Bld: 97 mg/dL (ref 70–99)
Potassium: 4.5 mEq/L (ref 3.5–5.1)
Sodium: 138 mEq/L (ref 135–145)
Total Bilirubin: 0.5 mg/dL (ref 0.2–1.2)
Total Protein: 7.7 g/dL (ref 6.0–8.3)

## 2021-05-15 LAB — CBC WITH DIFFERENTIAL/PLATELET
Basophils Absolute: 0 10*3/uL (ref 0.0–0.1)
Basophils Relative: 1.1 % (ref 0.0–3.0)
Eosinophils Absolute: 0.2 10*3/uL (ref 0.0–0.7)
Eosinophils Relative: 4.3 % (ref 0.0–5.0)
HCT: 32.3 % — ABNORMAL LOW (ref 36.0–46.0)
Hemoglobin: 10.4 g/dL — ABNORMAL LOW (ref 12.0–15.0)
Lymphocytes Relative: 20.3 % (ref 12.0–46.0)
Lymphs Abs: 0.8 10*3/uL (ref 0.7–4.0)
MCHC: 32.1 g/dL (ref 30.0–36.0)
MCV: 84.8 fl (ref 78.0–100.0)
Monocytes Absolute: 0.4 10*3/uL (ref 0.1–1.0)
Monocytes Relative: 11.1 % (ref 3.0–12.0)
Neutro Abs: 2.4 10*3/uL (ref 1.4–7.7)
Neutrophils Relative %: 63.2 % (ref 43.0–77.0)
Platelets: 137 10*3/uL — ABNORMAL LOW (ref 150.0–400.0)
RBC: 3.8 Mil/uL — ABNORMAL LOW (ref 3.87–5.11)
RDW: 15.1 % (ref 11.5–15.5)
WBC: 3.8 10*3/uL — ABNORMAL LOW (ref 4.0–10.5)

## 2021-05-15 NOTE — Progress Notes (Signed)
Subjective:  Patient ID: Diane Abbott, female    DOB: 09-08-1934  Age: 87 y.o. MRN: [id number]  CC: Follow-up   HPI Diane Abbott presents for insomnia, chronic pain, wt loss C/o L knee pain and L ankle pain - better  Outpatient Medications Prior to Visit  Medication Sig Dispense Refill   amLODipine (NORVASC) 2.5 MG tablet TAKE 1 TABLET BY MOUTH EVERY DAY 90 tablet 3   fluconazole (DIFLUCAN) 100 MG tablet Take 2 tabs on day#1, then 1 tab daily on Days #2-10 11 tablet 1   Fluticasone-Umeclidin-Vilant (TRELEGY ELLIPTA) 100-62.5-25 MCG/INH AEPB 1 inh daily 1 each 11   ipratropium (ATROVENT) 0.06 % nasal spray Place 2 sprays into the nose 3 (three) times daily. 15 mL 2   pantoprazole (PROTONIX) 40 MG tablet Take 1 tablet (40 mg total) by mouth daily. 90 tablet 1   rOPINIRole (REQUIP) 4 MG tablet TAKE 1 TABLET BY MOUTH 3 TIMES A DAY 270 tablet 3   triamcinolone cream (KENALOG) 0.5 % Apply 1 application topically 4 (four) times daily. 45 g 1   No facility-administered medications prior to visit.    ROS: Review of Systems  Constitutional:  Positive for fatigue. Negative for activity change, appetite change, chills and unexpected weight change.  HENT:  Negative for congestion, mouth sores and sinus pressure.   Eyes:  Negative for visual disturbance.  Respiratory:  Negative for cough and chest tightness.   Gastrointestinal:  Negative for abdominal pain and nausea.  Genitourinary:  Negative for difficulty urinating, frequency and vaginal pain.  Musculoskeletal:  Positive for arthralgias, back pain and gait problem.  Skin:  Negative for pallor and rash.  Neurological:  Positive for weakness. Negative for dizziness, tremors, numbness and headaches.  Psychiatric/Behavioral:  Negative for confusion, sleep disturbance and suicidal ideas.    Objective:  BP 128/72 (BP Location: Left Arm)    Pulse 72    Temp 98.2 F (36.8 C) (Oral)    SpO2 98%   BP Readings from Last 3 Encounters:   05/15/21 128/72  04/04/21 140/72  02/11/21 120/62    Wt Readings from Last 3 Encounters:  04/04/21 126 lb (57.2 kg)  01/14/21 121 lb (54.9 kg)  12/09/20 121 lb (54.9 kg)    Physical Exam Constitutional:      General: She is not in acute distress.    Appearance: She is well-developed. She is not toxic-appearing.  HENT:     Head: Normocephalic.     Right Ear: External ear normal.     Left Ear: External ear normal.     Nose: Nose normal.  Eyes:     General:        Right eye: No discharge.        Left eye: No discharge.     Conjunctiva/sclera: Conjunctivae normal.     Pupils: Pupils are equal, round, and reactive to light.  Neck:     Thyroid: No thyromegaly.     Vascular: No JVD.     Trachea: No tracheal deviation.  Cardiovascular:     Rate and Rhythm: Normal rate and regular rhythm.     Heart sounds: Murmur heard.  Pulmonary:     Effort: No respiratory distress.     Breath sounds: No stridor. No wheezing.  Abdominal:     General: Bowel sounds are normal. There is no distension.     Palpations: Abdomen is soft. There is no mass.     Tenderness: There is no abdominal tenderness.  There is no guarding or rebound.  Musculoskeletal:        General: Tenderness present.     Cervical back: Normal range of motion and neck supple. No rigidity.  Lymphadenopathy:     Cervical: No cervical adenopathy.  Skin:    Findings: No erythema or rash.  Neurological:     Mental Status: She is oriented to person, place, and time.     Cranial Nerves: No cranial nerve deficit.     Motor: No abnormal muscle tone.     Coordination: Coordination normal.     Deep Tendon Reflexes: Reflexes normal.  Psychiatric:        Behavior: Behavior normal.        Thought Content: Thought content normal.        Judgment: Judgment normal.  In a w/c  Lab Results  Component Value Date   WBC 4.0 04/04/2021   HGB 10.6 (L) 04/04/2021   HCT 32.2 (L) 04/04/2021   PLT 120.0 (L) 04/04/2021   GLUCOSE 93  04/04/2021   ALT 9 04/04/2021   AST 15 04/04/2021   NA 138 04/04/2021   K 3.7 04/04/2021   CL 103 04/04/2021   CREATININE 1.16 04/04/2021   BUN 33 (H) 04/04/2021   CO2 26 04/04/2021   TSH 1.09 08/26/2020   INR 1.3 (H) 01/14/2021   HGBA1C 5.5 06/24/2016    DG Chest Port 1 View  Result Date: 01/14/2021 CLINICAL DATA:  sob EXAM: PORTABLE CHEST 1 VIEW COMPARISON:  August 26, 2020. FINDINGS: Patient rotation to the left. Streaky left basilar opacities, most likely atelectasis. No confluent consolidation. Possible small left pleural effusion versus pleural thickening. No visible pneumothorax. Similar cardiomediastinal silhouette. Aortic valve replacement and median sternotomy. Similar tortuosity of the aorta. Partially imaged left reverse shoulder arthroplasty. Thoracic dextrocurvature. Polyarticular degenerative change. IMPRESSION: 1. Streaky left basilar opacities, most likely atelectasis. 2. Possible small left pleural effusion versus pleural thickening. Electronically Signed   By: Margaretha Sheffield M.D.   On: 01/14/2021 14:36    Assessment & Plan:   Problem List Items Addressed This Visit     Atherosclerosis of aorta ([place])    Unable to tolerate statins      Chronic pain    Off meds      Hypotension    Resolved      Relevant Orders   Comprehensive metabolic panel   Symptomatic anemia    Check CBC      Relevant Orders   CBC with Differential/Platelet   Comprehensive metabolic panel   Weight loss    Better on diet         No orders of the defined types were placed in this encounter.     Follow-up: Return in about 3 months (around 08/13/2021) for a follow-up visit.  Walker Kehr, MD

## 2021-05-15 NOTE — Assessment & Plan Note (Signed)
Resolved

## 2021-05-15 NOTE — Assessment & Plan Note (Signed)
Better on diet 

## 2021-05-15 NOTE — Patient Instructions (Signed)
TENS unit

## 2021-05-15 NOTE — Assessment & Plan Note (Signed)
Check CBC 

## 2021-05-15 NOTE — Assessment & Plan Note (Signed)
Unable to tolerate statins 

## 2021-05-15 NOTE — Assessment & Plan Note (Signed)
Off meds 

## 2021-06-17 ENCOUNTER — Encounter: Payer: Self-pay | Admitting: Internal Medicine

## 2021-06-19 DIAGNOSIS — J410 Simple chronic bronchitis: Secondary | ICD-10-CM | POA: Diagnosis not present

## 2021-07-01 DIAGNOSIS — J410 Simple chronic bronchitis: Secondary | ICD-10-CM | POA: Diagnosis not present

## 2021-07-03 DIAGNOSIS — Z96653 Presence of artificial knee joint, bilateral: Secondary | ICD-10-CM | POA: Diagnosis not present

## 2021-07-03 DIAGNOSIS — Z96652 Presence of left artificial knee joint: Secondary | ICD-10-CM | POA: Diagnosis not present

## 2021-07-03 DIAGNOSIS — Z96651 Presence of right artificial knee joint: Secondary | ICD-10-CM | POA: Diagnosis not present

## 2021-07-11 DIAGNOSIS — D61818 Other pancytopenia: Secondary | ICD-10-CM | POA: Diagnosis not present

## 2021-07-11 DIAGNOSIS — I130 Hypertensive heart and chronic kidney disease with heart failure and stage 1 through stage 4 chronic kidney disease, or unspecified chronic kidney disease: Secondary | ICD-10-CM | POA: Diagnosis not present

## 2021-07-11 DIAGNOSIS — I5032 Chronic diastolic (congestive) heart failure: Secondary | ICD-10-CM | POA: Diagnosis not present

## 2021-07-11 DIAGNOSIS — N183 Chronic kidney disease, stage 3 unspecified: Secondary | ICD-10-CM | POA: Diagnosis not present

## 2021-07-11 DIAGNOSIS — M0600 Rheumatoid arthritis without rheumatoid factor, unspecified site: Secondary | ICD-10-CM | POA: Diagnosis not present

## 2021-07-11 DIAGNOSIS — I7102 Dissection of abdominal aorta: Secondary | ICD-10-CM | POA: Diagnosis not present

## 2021-07-11 DIAGNOSIS — Z952 Presence of prosthetic heart valve: Secondary | ICD-10-CM | POA: Diagnosis not present

## 2021-07-11 DIAGNOSIS — I714 Abdominal aortic aneurysm, without rupture, unspecified: Secondary | ICD-10-CM | POA: Diagnosis not present

## 2021-07-11 DIAGNOSIS — D696 Thrombocytopenia, unspecified: Secondary | ICD-10-CM | POA: Diagnosis not present

## 2021-07-11 DIAGNOSIS — D631 Anemia in chronic kidney disease: Secondary | ICD-10-CM | POA: Diagnosis not present

## 2021-07-11 DIAGNOSIS — J42 Unspecified chronic bronchitis: Secondary | ICD-10-CM | POA: Diagnosis not present

## 2021-08-06 DIAGNOSIS — M0600 Rheumatoid arthritis without rheumatoid factor, unspecified site: Secondary | ICD-10-CM | POA: Diagnosis not present

## 2021-08-06 DIAGNOSIS — D61818 Other pancytopenia: Secondary | ICD-10-CM | POA: Diagnosis not present

## 2021-08-13 ENCOUNTER — Encounter: Payer: Self-pay | Admitting: Internal Medicine

## 2021-08-13 ENCOUNTER — Ambulatory Visit (INDEPENDENT_AMBULATORY_CARE_PROVIDER_SITE_OTHER): Payer: PPO | Admitting: Internal Medicine

## 2021-08-13 VITALS — BP 134/72 | HR 70 | Temp 97.8°F | Ht 60.0 in

## 2021-08-13 DIAGNOSIS — J431 Panlobular emphysema: Secondary | ICD-10-CM | POA: Diagnosis not present

## 2021-08-13 DIAGNOSIS — G894 Chronic pain syndrome: Secondary | ICD-10-CM

## 2021-08-13 DIAGNOSIS — F5101 Primary insomnia: Secondary | ICD-10-CM | POA: Diagnosis not present

## 2021-08-13 DIAGNOSIS — D509 Iron deficiency anemia, unspecified: Secondary | ICD-10-CM | POA: Diagnosis not present

## 2021-08-13 DIAGNOSIS — N1831 Chronic kidney disease, stage 3a: Secondary | ICD-10-CM

## 2021-08-13 DIAGNOSIS — R634 Abnormal weight loss: Secondary | ICD-10-CM

## 2021-08-13 LAB — CBC WITH DIFFERENTIAL/PLATELET
Basophils Absolute: 0 10*3/uL (ref 0.0–0.1)
Basophils Relative: 1.3 % (ref 0.0–3.0)
Eosinophils Absolute: 0.1 10*3/uL (ref 0.0–0.7)
Eosinophils Relative: 4.6 % (ref 0.0–5.0)
HCT: 32.9 % — ABNORMAL LOW (ref 36.0–46.0)
Hemoglobin: 10.9 g/dL — ABNORMAL LOW (ref 12.0–15.0)
Lymphocytes Relative: 22.7 % (ref 12.0–46.0)
Lymphs Abs: 0.7 10*3/uL (ref 0.7–4.0)
MCHC: 33.2 g/dL (ref 30.0–36.0)
MCV: 85.4 fl (ref 78.0–100.0)
Monocytes Absolute: 0.3 10*3/uL (ref 0.1–1.0)
Monocytes Relative: 10 % (ref 3.0–12.0)
Neutro Abs: 1.9 10*3/uL (ref 1.4–7.7)
Neutrophils Relative %: 61.4 % (ref 43.0–77.0)
Platelets: 143 10*3/uL — ABNORMAL LOW (ref 150.0–400.0)
RBC: 3.86 Mil/uL — ABNORMAL LOW (ref 3.87–5.11)
RDW: 16.3 % — ABNORMAL HIGH (ref 11.5–15.5)
WBC: 3.1 10*3/uL — ABNORMAL LOW (ref 4.0–10.5)

## 2021-08-13 LAB — COMPREHENSIVE METABOLIC PANEL
ALT: 9 U/L (ref 0–35)
AST: 17 U/L (ref 0–37)
Albumin: 3.9 g/dL (ref 3.5–5.2)
Alkaline Phosphatase: 83 U/L (ref 39–117)
BUN: 27 mg/dL — ABNORMAL HIGH (ref 6–23)
CO2: 29 mEq/L (ref 19–32)
Calcium: 9.6 mg/dL (ref 8.4–10.5)
Chloride: 102 mEq/L (ref 96–112)
Creatinine, Ser: 1.22 mg/dL — ABNORMAL HIGH (ref 0.40–1.20)
GFR: 40.55 mL/min — ABNORMAL LOW (ref >60.00)
Glucose, Bld: 98 mg/dL (ref 70–99)
Potassium: 4.2 mEq/L (ref 3.5–5.1)
Sodium: 139 mEq/L (ref 135–145)
Total Bilirubin: 0.6 mg/dL (ref 0.2–1.2)
Total Protein: 7.6 g/dL (ref 6.0–8.3)

## 2021-08-13 MED ORDER — TRIAZOLAM 0.125 MG PO TABS: 0.1250 mg | ORAL_TABLET | Freq: Every evening | ORAL | 2 refills | Status: AC | PRN

## 2021-08-13 MED ORDER — IPRATROPIUM-ALBUTEROL 0.5-2.5 (3) MG/3ML IN SOLN: 3.0000 mL | Freq: Four times a day (QID) | RESPIRATORY_TRACT | 3 refills | Status: DC | PRN

## 2021-08-13 NOTE — Assessment & Plan Note (Signed)
In a w/c ? L knee pain  pt saw Dr Wynelle Link - he is unable to fix it w/o surgery; can't bear wt on it. ?Blue-Emu cream was recommended to use 2-3 times a day ? ?

## 2021-08-13 NOTE — Assessment & Plan Note (Signed)
Worse ?Start Duoneb via [place] ?

## 2021-08-13 NOTE — Patient Instructions (Signed)
Blue-Emu cream -- use 2-3 times a day ? ?

## 2021-08-13 NOTE — Progress Notes (Signed)
? ?Subjective:  ?Patient ID: Diane Abbott, female    DOB: 1934-06-26  Age: 87 y.o. MRN: [id number] ? ?CC: Follow-up ? ? ?HPI ?[name] presents for wt loss, HTN, L knee pain  pt saw Dr Wynelle Link - he is unable to fix it w/o surgery; can't bear wt on it. ?F/u wt loss ?C/o insomnia - used Triazolam in the past - ok ?C/o COPD - needs HHN ? ? ?Outpatient Medications Prior to Visit  ?Medication Sig Dispense Refill  ? amLODipine (NORVASC) 2.5 MG tablet TAKE 1 TABLET BY MOUTH EVERY DAY 90 tablet 3  ? fluconazole (DIFLUCAN) 100 MG tablet Take 2 tabs on day#1, then 1 tab daily on Days #2-10 11 tablet 1  ? Fluticasone-Umeclidin-Vilant (TRELEGY ELLIPTA) 100-62.5-25 MCG/INH AEPB 1 inh daily 1 each 11  ? ipratropium (ATROVENT) 0.06 % nasal spray Place 2 sprays into the nose 3 (three) times daily. 15 mL 2  ? pantoprazole (PROTONIX) 40 MG tablet Take 1 tablet (40 mg total) by mouth daily. 90 tablet 1  ? rOPINIRole (REQUIP) 4 MG tablet TAKE 1 TABLET BY MOUTH 3 TIMES A DAY 270 tablet 3  ? triamcinolone cream (KENALOG) 0.5 % Apply 1 application topically 4 (four) times daily. 45 g 1  ? ?No facility-administered medications prior to visit.  ? ? ?ROS: ?Review of Systems  ?Constitutional:  Positive for fatigue. Negative for activity change, appetite change, chills and unexpected weight change.  ?HENT:  Negative for congestion, mouth sores and sinus pressure.   ?Eyes:  Negative for visual disturbance.  ?Respiratory:  Negative for cough and chest tightness.   ?Gastrointestinal:  Negative for abdominal pain and nausea.  ?Genitourinary:  Negative for difficulty urinating, frequency and vaginal pain.  ?Musculoskeletal:  Positive for arthralgias, back pain, gait problem, joint swelling, neck pain and neck stiffness.  ?Skin:  Negative for pallor and rash.  ?Neurological:  Positive for weakness. Negative for dizziness, tremors, numbness and headaches.  ?Psychiatric/Behavioral:  Positive for decreased concentration and sleep disturbance.  Negative for confusion and suicidal ideas. The patient is nervous/anxious.   ? ?Objective:  ?BP 134/72 (BP Location: Left Arm, Patient Position: Sitting, Cuff Size: Normal)   Pulse 70   Temp 97.8 ?F (36.6 ?C) (Temporal)   Ht 5' (1.524 m)   SpO2 96%   BMI 24.61 kg/m?  ? ?BP Readings from Last 3 Encounters:  ?08/13/21 134/72  ?05/15/21 128/72  ?04/04/21 140/72  ? ? ?Wt Readings from Last 3 Encounters:  ?04/04/21 126 lb (57.2 kg)  ?01/14/21 121 lb (54.9 kg)  ?12/09/20 121 lb (54.9 kg)  ? ? ?Physical Exam ?Constitutional:   ?   General: She is not in acute distress. ?   Appearance: Normal appearance. She is well-developed.  ?HENT:  ?   Head: Normocephalic.  ?   Right Ear: External ear normal.  ?   Left Ear: External ear normal.  ?   Nose: Nose normal.  ?Eyes:  ?   General:     ?   Right eye: No discharge.     ?   Left eye: No discharge.  ?   Conjunctiva/sclera: Conjunctivae normal.  ?   Pupils: Pupils are equal, round, and reactive to light.  ?Neck:  ?   Thyroid: No thyromegaly.  ?   Vascular: No JVD.  ?   Trachea: No tracheal deviation.  ?Cardiovascular:  ?   Rate and Rhythm: Regular rhythm. Tachycardia present.  ?   Heart sounds: Murmur heard.  ?Pulmonary:  ?  Effort: No respiratory distress.  ?   Breath sounds: No stridor. No wheezing or rhonchi.  ?Abdominal:  ?   General: Bowel sounds are normal. There is no distension.  ?   Palpations: Abdomen is soft. There is no mass.  ?   Tenderness: There is no abdominal tenderness. There is no guarding or rebound.  ?Musculoskeletal:     ?   General: Tenderness and deformity present.  ?   Cervical back: Normal range of motion and neck supple. No rigidity.  ?Lymphadenopathy:  ?   Cervical: No cervical adenopathy.  ?Skin: ?   Findings: No erythema or rash.  ?Neurological:  ?   Mental Status: She is oriented to person, place, and time.  ?   Cranial Nerves: No cranial nerve deficit.  ?   Motor: No abnormal muscle tone.  ?   Coordination: Coordination normal.  ?   Gait: Gait  abnormal.  ?   Deep Tendon Reflexes: Reflexes normal.  ?Psychiatric:     ?   Behavior: Behavior normal.     ?   Thought Content: Thought content normal.     ?   Judgment: Judgment normal.  ?In a w/c ?L knee w/pain ? ? ? A total time of 45 minutes was spent preparing to see the patient, reviewing tests, x-rays, operative reports and other medical records.  Also, obtaining history and performing comprehensive physical exam.  Additionally, counseling the patient regarding the above listed issues - pain, insomnia, COPD.   Finally, documenting clinical information in the health records, coordination of care, educating the patient. It is a complex case. ? ? ?Lab Results  ?Component Value Date  ? WBC 3.8 (L) 05/15/2021  ? HGB 10.4 (L) 05/15/2021  ? HCT 32.3 (L) 05/15/2021  ? PLT 137.0 (L) 05/15/2021  ? GLUCOSE 97 05/15/2021  ? ALT 9 05/15/2021  ? AST 17 05/15/2021  ? NA 138 05/15/2021  ? K 4.5 05/15/2021  ? CL 103 05/15/2021  ? CREATININE 1.36 (H) 05/15/2021  ? BUN 35 (H) 05/15/2021  ? CO2 29 05/15/2021  ? TSH 1.09 08/26/2020  ? INR 1.3 (H) 01/14/2021  ? HGBA1C 5.5 06/24/2016  ? ? ?DG Chest Port 1 View ? ?Result Date: 01/14/2021 ?CLINICAL DATA:  sob EXAM: PORTABLE CHEST 1 VIEW COMPARISON:  August 26, 2020. FINDINGS: Patient rotation to the left. Streaky left basilar opacities, most likely atelectasis. No confluent consolidation. Possible small left pleural effusion versus pleural thickening. No visible pneumothorax. Similar cardiomediastinal silhouette. Aortic valve replacement and median sternotomy. Similar tortuosity of the aorta. Partially imaged left reverse shoulder arthroplasty. Thoracic dextrocurvature. Polyarticular degenerative change. IMPRESSION: 1. Streaky left basilar opacities, most likely atelectasis. 2. Possible small left pleural effusion versus pleural thickening. Electronically Signed   By: Margaretha Sheffield M.D.   On: 01/14/2021 14:36  ? ? ?Assessment & Plan:  ? ?Problem List Items Addressed This Visit    ? ? Chronic pain  ?  In a w/c ? L knee pain  pt saw Dr Wynelle Link - he is unable to fix it w/o surgery; can't bear wt on it. ?Blue-Emu cream was recommended to use 2-3 times a day ? ?  ?  ? Insomnia disorder  ?  Severe - pt wants to re-try a low dose Halcion ?She used Triazolam in the past - ok ? Potential benefits of a long term benzodiazepines  use as well as potential risks  and complications were explained to the patient and were aknowledged. ? ?  ?  ?  COPD (chronic obstructive pulmonary disease) ([place])  ?  Worse ?Start Duoneb via [facility] ?  ?  ? Relevant Medications  ? ipratropium-albuterol (DUONEB) 0.5-2.5 (3) MG/3ML SOLN  ? Chronic renal insufficiency, stage III (moderate) ([facility])  ?  Hydrate well ?  ?  ? Weight loss  ?  Better now ?Wt Readings from Last 3 Encounters:  ?04/04/21 126 lb (57.2 kg)  ?01/14/21 121 lb (54.9 kg)  ?12/09/20 121 lb (54.9 kg)  ? ? ?  ?  ? Relevant Orders  ? CBC with Differential/Platelet  ? Comprehensive metabolic panel  ? Iron, TIBC and Ferritin Panel  ? ?Other Visit Diagnoses   ? ? Iron deficiency anemia, unspecified iron deficiency anemia type    -  Primary  ? Relevant Orders  ? CBC with Differential/Platelet  ? Comprehensive metabolic panel  ? Iron, TIBC and Ferritin Panel  ? ?  ?  ? ? ?Meds ordered this encounter  ?Medications  ? ipratropium-albuterol (DUONEB) 0.5-2.5 (3) MG/3ML SOLN  ?  Sig: Take 3 mLs by nebulization every 6 (six) hours as needed.  ?  Dispense:  360 mL  ?  Refill:  3  ? triazolam (HALCION) 0.125 MG tablet  ?  Sig: Take 1 tablet (0.125 mg total) by mouth at bedtime as needed for sleep.  ?  Dispense:  30 tablet  ?  Refill:  2  ?  ? ? ?Follow-up: Return in about 3 months (around 11/12/2021) for a follow-up visit. ? ?Walker Kehr, MD ?

## 2021-08-13 NOTE — Assessment & Plan Note (Signed)
Severe - pt wants to re-try a low dose Halcion ?She used Triazolam in the past - ok ? Potential benefits of a long term benzodiazepines  use as well as potential risks  and complications were explained to the patient and were aknowledged. ? ?

## 2021-08-13 NOTE — Assessment & Plan Note (Signed)
Hydrate well 

## 2021-08-13 NOTE — Assessment & Plan Note (Signed)
Better now ?Wt Readings from Last 3 Encounters:  ?04/04/21 126 lb (57.2 kg)  ?01/14/21 121 lb (54.9 kg)  ?12/09/20 121 lb (54.9 kg)  ? ? ?

## 2021-08-14 ENCOUNTER — Telehealth: Payer: Self-pay | Admitting: *Deleted

## 2021-08-14 LAB — IRON,TIBC AND FERRITIN PANEL
%SAT: 13 % (calc) — ABNORMAL LOW (ref 16–45)
Ferritin: 28 ng/mL (ref 16–288)
Iron: 52 ug/dL (ref 45–160)
TIBC: 391 mcg/dL (calc) (ref 250–450)

## 2021-08-14 NOTE — Telephone Encounter (Signed)
Rec'd PA for Triazolam 0.'125mg'$  completed via cover-my-meds w/ (Key: [id number]). Rec'd m sg stating "  [employer] has not yet replied to your PA request. You may close this dialog, return to your dashboard, and perform other tasks." Wll check for approval 24-48hrs.Marland KitchenJohny Chess ?

## 2021-08-15 NOTE — Telephone Encounter (Signed)
Diane Abbott, ?Please inform the patient - if she wants to pay for it - she can find a better price via [email or web address]. ?Thanks, ?AP ? ?

## 2021-08-15 NOTE — Telephone Encounter (Signed)
Rec'd fax med was DENIED. It states plan or payment under Medicare Part D is non-formulary does not meet criteria.No alternative was given.Marland KitchenJohny Chess ?

## 2021-08-18 NOTE — Telephone Encounter (Signed)
Called pt no answer LMOM w/MD response../lmb 

## 2021-10-06 ENCOUNTER — Other Ambulatory Visit: Payer: Self-pay | Admitting: Surgery

## 2021-10-06 DIAGNOSIS — Z952 Presence of prosthetic heart valve: Secondary | ICD-10-CM

## 2021-10-23 DIAGNOSIS — N183 Chronic kidney disease, stage 3 unspecified: Secondary | ICD-10-CM | POA: Diagnosis not present

## 2021-11-10 ENCOUNTER — Telehealth: Payer: Self-pay | Admitting: Internal Medicine

## 2021-11-10 NOTE — Telephone Encounter (Signed)
Heidi with [facility] calls today in regards to PT requesting a manual wheelchair. PT is requesting this for helping her get in and out of the house as well as too her appointments. Heidi was wanting to know if we could get an order sent to our/local DME company to get that equipment out too her.  CB if needed: [phone]

## 2021-11-11 NOTE — Telephone Encounter (Signed)
Daughter is seeing MD this am.. he is giving order to her.Marland KitchenJohny Abbott

## 2021-11-11 NOTE — Telephone Encounter (Signed)
Diane Abbott, I will put a prescription on your desk.  Thanks

## 2021-11-12 ENCOUNTER — Encounter: Payer: Self-pay | Admitting: Internal Medicine

## 2021-11-12 ENCOUNTER — Ambulatory Visit (INDEPENDENT_AMBULATORY_CARE_PROVIDER_SITE_OTHER): Payer: PPO | Admitting: Internal Medicine

## 2021-11-12 VITALS — BP 102/60 | HR 69 | Temp 98.6°F | Ht 60.0 in

## 2021-11-12 DIAGNOSIS — D500 Iron deficiency anemia secondary to blood loss (chronic): Secondary | ICD-10-CM | POA: Diagnosis not present

## 2021-11-12 DIAGNOSIS — I10 Essential (primary) hypertension: Secondary | ICD-10-CM

## 2021-11-12 DIAGNOSIS — G720 Drug-induced myopathy: Secondary | ICD-10-CM

## 2021-11-12 DIAGNOSIS — J431 Panlobular emphysema: Secondary | ICD-10-CM

## 2021-11-12 DIAGNOSIS — N1831 Chronic kidney disease, stage 3a: Secondary | ICD-10-CM

## 2021-11-12 DIAGNOSIS — T466X5A Adverse effect of antihyperlipidemic and antiarteriosclerotic drugs, initial encounter: Secondary | ICD-10-CM | POA: Diagnosis not present

## 2021-11-12 DIAGNOSIS — D509 Iron deficiency anemia, unspecified: Secondary | ICD-10-CM | POA: Insufficient documentation

## 2021-11-12 DIAGNOSIS — R531 Weakness: Secondary | ICD-10-CM | POA: Diagnosis not present

## 2021-11-12 LAB — COMPREHENSIVE METABOLIC PANEL
ALT: 6 U/L (ref 0–35)
AST: 14 U/L (ref 0–37)
Albumin: 3.8 g/dL (ref 3.5–5.2)
Alkaline Phosphatase: 83 U/L (ref 39–117)
BUN: 23 mg/dL (ref 6–23)
CO2: 27 mEq/L (ref 19–32)
Calcium: 9.5 mg/dL (ref 8.4–10.5)
Chloride: 100 mEq/L (ref 96–112)
Creatinine, Ser: 1.37 mg/dL — ABNORMAL HIGH (ref 0.40–1.20)
GFR: 35.22 mL/min — ABNORMAL LOW (ref >60.00)
Glucose, Bld: 104 mg/dL — ABNORMAL HIGH (ref 70–99)
Potassium: 4.6 mEq/L (ref 3.5–5.1)
Sodium: 134 mEq/L — ABNORMAL LOW (ref 135–145)
Total Bilirubin: 0.7 mg/dL (ref 0.2–1.2)
Total Protein: 7.7 g/dL (ref 6.0–8.3)

## 2021-11-12 LAB — CBC WITH DIFFERENTIAL/PLATELET
Basophils Absolute: 0 10*3/uL (ref 0.0–0.1)
Basophils Relative: 0.7 % (ref 0.0–3.0)
Eosinophils Absolute: 0.1 10*3/uL (ref 0.0–0.7)
Eosinophils Relative: 2 % (ref 0.0–5.0)
HCT: 33.4 % — ABNORMAL LOW (ref 36.0–46.0)
Hemoglobin: 11 g/dL — ABNORMAL LOW (ref 12.0–15.0)
Lymphocytes Relative: 16 % (ref 12.0–46.0)
Lymphs Abs: 0.7 10*3/uL (ref 0.7–4.0)
MCHC: 32.9 g/dL (ref 30.0–36.0)
MCV: 85 fl (ref 78.0–100.0)
Monocytes Absolute: 0.4 10*3/uL (ref 0.1–1.0)
Monocytes Relative: 8.4 % (ref 3.0–12.0)
Neutro Abs: 3.2 10*3/uL (ref 1.4–7.7)
Neutrophils Relative %: 72.9 % (ref 43.0–77.0)
Platelets: 131 10*3/uL — ABNORMAL LOW (ref 150.0–400.0)
RBC: 3.94 Mil/uL (ref 3.87–5.11)
RDW: 15 % (ref 11.5–15.5)
WBC: 4.4 10*3/uL (ref 4.0–10.5)

## 2021-11-12 LAB — CORTISOL: Cortisol, Plasma: 19.8 ug/dL

## 2021-11-12 LAB — TSH: TSH: 1.83 u[IU]/mL (ref 0.35–5.50)

## 2021-11-12 LAB — T4, FREE: Free T4: 1.29 ng/dL (ref 0.60–1.60)

## 2021-11-12 MED ORDER — ROPINIROLE HCL 4 MG PO TABS: 4.0000 mg | ORAL_TABLET | Freq: Three times a day (TID) | ORAL | 3 refills | Status: AC

## 2021-11-12 MED ORDER — IPRATROPIUM-ALBUTEROL 0.5-2.5 (3) MG/3ML IN SOLN: 3.0000 mL | Freq: Four times a day (QID) | RESPIRATORY_TRACT | 3 refills | Status: AC | PRN

## 2021-11-12 MED ORDER — ACCRUFER 30 MG PO CAPS: ORAL_CAPSULE | ORAL | 5 refills | Status: AC

## 2021-11-12 NOTE — Assessment & Plan Note (Signed)
Start Accrufer if tolerated/covered

## 2021-11-12 NOTE — Assessment & Plan Note (Signed)
Chronic pain - better Blue-Emu cream was recommended to use 2-3 times a day

## 2021-11-12 NOTE — Assessment & Plan Note (Signed)
Hydrate well 

## 2021-11-12 NOTE — Progress Notes (Signed)
Subjective:  Patient ID: [name], female    DOB: 01-20-1935  Age: 87 y.o. MRN: [id number]  CC: No chief complaint on file.   HPI ANAIA FRITH presents for anemia, weakness, RLS  Outpatient Medications Prior to Visit  Medication Sig Dispense Refill   amLODipine (NORVASC) 2.5 MG tablet TAKE 1 TABLET BY MOUTH EVERY DAY 90 tablet 3   fluconazole (DIFLUCAN) 100 MG tablet Take 2 tabs on day#1, then 1 tab daily on Days #2-10 11 tablet 1   Fluticasone-Umeclidin-Vilant (TRELEGY ELLIPTA) 100-62.5-25 MCG/INH AEPB 1 inh daily 1 each 11   ipratropium (ATROVENT) 0.06 % nasal spray Place 2 sprays into the nose 3 (three) times daily. 15 mL 2   pantoprazole (PROTONIX) 40 MG tablet Take 1 tablet (40 mg total) by mouth daily. 90 tablet 1   triamcinolone cream (KENALOG) 0.5 % Apply 1 application topically 4 (four) times daily. 45 g 1   triazolam (HALCION) 0.125 MG tablet Take 1 tablet (0.125 mg total) by mouth at bedtime as needed for sleep. 30 tablet 2   ipratropium-albuterol (DUONEB) 0.5-2.5 (3) MG/3ML SOLN Take 3 mLs by nebulization every 6 (six) hours as needed. 360 mL 3   rOPINIRole (REQUIP) 4 MG tablet TAKE 1 TABLET BY MOUTH 3 TIMES A DAY 270 tablet 3   No facility-administered medications prior to visit.    ROS: Review of Systems  Constitutional:  Positive for fatigue. Negative for activity change, appetite change, chills and unexpected weight change.  HENT:  Negative for congestion, mouth sores and sinus pressure.   Eyes:  Negative for visual disturbance.  Respiratory:  Positive for shortness of breath. Negative for cough, chest tightness and wheezing.   Cardiovascular:  Positive for palpitations. Negative for leg swelling.  Gastrointestinal:  Negative for abdominal pain and nausea.  Genitourinary:  Negative for difficulty urinating, frequency and vaginal pain.  Musculoskeletal:  Positive for arthralgias, back pain and gait problem.  Skin:  Negative for pallor and rash.   Neurological:  Positive for weakness and numbness. Negative for dizziness, tremors and headaches.  Psychiatric/Behavioral:  Positive for dysphoric mood. Negative for confusion, sleep disturbance and suicidal ideas.     Objective:  BP 102/60 (BP Location: Left Arm, Patient Position: Sitting, Cuff Size: Normal)   Pulse 69   Temp 98.6 F (37 C) (Oral)   Ht 5' (1.524 m)   SpO2 98%   BMI 24.61 kg/m   BP Readings from Last 3 Encounters:  11/12/21 102/60  08/13/21 134/72  05/15/21 128/72    Wt Readings from Last 3 Encounters:  04/04/21 126 lb (57.2 kg)  01/14/21 121 lb (54.9 kg)  12/09/20 121 lb (54.9 kg)    Physical Exam Constitutional:      General: She is not in acute distress.    Appearance: Normal appearance. She is well-developed.  HENT:     Head: Normocephalic.     Right Ear: External ear normal.     Left Ear: External ear normal.     Nose: Nose normal.  Eyes:     General:        Right eye: No discharge.        Left eye: No discharge.     Conjunctiva/sclera: Conjunctivae normal.     Pupils: Pupils are equal, round, and reactive to light.  Neck:     Thyroid: No thyromegaly.     Vascular: No JVD.     Trachea: No tracheal deviation.  Cardiovascular:     Rate  and Rhythm: Normal rate and regular rhythm.     Heart sounds: Murmur heard.  Pulmonary:     Effort: No respiratory distress.     Breath sounds: No stridor. No wheezing.  Abdominal:     General: Bowel sounds are normal. There is no distension.     Palpations: Abdomen is soft. There is no mass.     Tenderness: There is no abdominal tenderness. There is no guarding or rebound.  Musculoskeletal:        General: Tenderness present.     Cervical back: Normal range of motion and neck supple. No rigidity.  Lymphadenopathy:     Cervical: No cervical adenopathy.  Skin:    Findings: No erythema or rash.  Neurological:     Mental Status: She is oriented to person, place, and time.     Cranial Nerves: No cranial  nerve deficit.     Motor: No abnormal muscle tone.     Coordination: Coordination normal.     Gait: Gait abnormal.     Deep Tendon Reflexes: Reflexes normal.  Psychiatric:        Behavior: Behavior normal.        Thought Content: Thought content normal.        Judgment: Judgment normal.   In a w/c     A total time of 45 minutes was spent preparing to see the patient, reviewing tests, x-rays, operative reports and other medical records.  Also, obtaining history and performing comprehensive physical exam.  Additionally, counseling the patient regarding the above listed issues.   Finally, documenting clinical information in the health records, coordination of care, educating the patient. It is a complex case.  Lab Results  Component Value Date   WBC 3.1 (L) 08/13/2021   HGB 10.9 (L) 08/13/2021   HCT 32.9 (L) 08/13/2021   PLT 143.0 (L) 08/13/2021   GLUCOSE 98 08/13/2021   ALT 9 08/13/2021   AST 17 08/13/2021   NA 139 08/13/2021   K 4.2 08/13/2021   CL 102 08/13/2021   CREATININE 1.22 (H) 08/13/2021   BUN 27 (H) 08/13/2021   CO2 29 08/13/2021   TSH 1.09 08/26/2020   INR 1.3 (H) 01/14/2021   HGBA1C 5.5 06/24/2016    DG Chest Port 1 View  Result Date: 01/14/2021 CLINICAL DATA:  sob EXAM: PORTABLE CHEST 1 VIEW COMPARISON:  August 26, 2020. FINDINGS: Patient rotation to the left. Streaky left basilar opacities, most likely atelectasis. No confluent consolidation. Possible small left pleural effusion versus pleural thickening. No visible pneumothorax. Similar cardiomediastinal silhouette. Aortic valve replacement and median sternotomy. Similar tortuosity of the aorta. Partially imaged left reverse shoulder arthroplasty. Thoracic dextrocurvature. Polyarticular degenerative change. IMPRESSION: 1. Streaky left basilar opacities, most likely atelectasis. 2. Possible small left pleural effusion versus pleural thickening. Electronically Signed   By: Margaretha Sheffield M.D.   On: 01/14/2021 14:36     Assessment & Plan:   Problem List Items Addressed This Visit     Chronic renal insufficiency, stage III (moderate) ([place])    Hydrate well      Relevant Orders   CBC with Differential/Platelet   Comprehensive metabolic panel   TSH   T4, free   Iron, TIBC and Ferritin Panel   Cortisol   COPD (chronic obstructive pulmonary disease) ([place]) - Primary   Relevant Medications   ipratropium-albuterol (DUONEB) 0.5-2.5 (3) MG/3ML SOLN   Other Relevant Orders   For home use only DME Nebulizer machine   HTN (hypertension)  Iron deficiency anemia    Start Accrufer if tolerated/covered      Relevant Medications   Ferric Maltol (ACCRUFER) 30 MG CAPS   Other Relevant Orders   CBC with Differential/Platelet   Comprehensive metabolic panel   TSH   T4, free   Iron, TIBC and Ferritin Panel   Cortisol   Statin myopathy    Blue-Emu cream was recommended to use 2-3 times a day       Other Visit Diagnoses     Weak       Relevant Orders   CBC with Differential/Platelet   TSH   Iron, TIBC and Ferritin Panel   Cortisol         Meds ordered this encounter  Medications   Ferric Maltol (ACCRUFER) 30 MG CAPS    Sig: 1 po bid    Dispense:  60 capsule    Refill:  5    Intolerant of OTC iron   ipratropium-albuterol (DUONEB) 0.5-2.5 (3) MG/3ML SOLN    Sig: Take 3 mLs by nebulization every 6 (six) hours as needed.    Dispense:  360 mL    Refill:  3   rOPINIRole (REQUIP) 4 MG tablet    Sig: Take 1 tablet (4 mg total) by mouth 3 (three) times daily.    Dispense:  270 tablet    Refill:  3      Follow-up: Return in about 3 months (around 02/12/2022) for a follow-up visit.  Walker Kehr, MD

## 2021-11-12 NOTE — Assessment & Plan Note (Signed)
Blue-Emu cream was recommended to use 2-3 times a day ? ?

## 2021-11-13 LAB — IRON,TIBC AND FERRITIN PANEL
%SAT: 7 % (calc) — ABNORMAL LOW (ref 16–45)
Ferritin: 55 ng/mL (ref 16–288)
Iron: 23 ug/dL — ABNORMAL LOW (ref 45–160)
TIBC: 307 mcg/dL (calc) (ref 250–450)

## 2021-11-18 DIAGNOSIS — H579 Unspecified disorder of eye and adnexa: Secondary | ICD-10-CM | POA: Diagnosis not present

## 2021-11-25 DIAGNOSIS — M25462 Effusion, left knee: Secondary | ICD-10-CM | POA: Diagnosis not present

## 2021-11-28 DIAGNOSIS — M79605 Pain in left leg: Secondary | ICD-10-CM | POA: Diagnosis not present

## 2021-11-28 DIAGNOSIS — M25462 Effusion, left knee: Secondary | ICD-10-CM | POA: Diagnosis not present

## 2021-11-28 DIAGNOSIS — M7989 Other specified soft tissue disorders: Secondary | ICD-10-CM | POA: Diagnosis not present

## 2021-12-09 DIAGNOSIS — Z515 Encounter for palliative care: Secondary | ICD-10-CM | POA: Diagnosis not present

## 2021-12-09 DIAGNOSIS — I714 Abdominal aortic aneurysm, without rupture, unspecified: Secondary | ICD-10-CM | POA: Diagnosis not present

## 2021-12-09 DIAGNOSIS — G8929 Other chronic pain: Secondary | ICD-10-CM | POA: Diagnosis not present

## 2021-12-09 DIAGNOSIS — M25462 Effusion, left knee: Secondary | ICD-10-CM | POA: Diagnosis not present

## 2021-12-10 ENCOUNTER — Other Ambulatory Visit: Payer: PPO

## 2021-12-10 ENCOUNTER — Ambulatory Visit: Payer: PPO | Admitting: Surgery

## 2021-12-11 ENCOUNTER — Encounter: Payer: Self-pay | Admitting: Internal Medicine

## 2021-12-11 NOTE — Telephone Encounter (Signed)
Check [place] registry last filled 11/21/2020.Marland KitchenJohny Chess

## 2021-12-13 ENCOUNTER — Encounter: Payer: Self-pay | Admitting: Internal Medicine

## 2021-12-15 ENCOUNTER — Telehealth: Payer: Self-pay

## 2021-12-15 NOTE — Telephone Encounter (Signed)
Pt is requesting a Rx Oxycodone.  Pt didn't want to schedule an appt and just request a New Rx for the medication. Pt states that Dr. Alain Marion just needed to know the Pharmacy to send med to.  Pharmacy: [id number]- [place], [place] - [id number]  LOV 11/12/21 ROV 02/16/22

## 2021-12-16 NOTE — Telephone Encounter (Signed)
Heidi calling from [facility]  is calling on behalf on the pt regarding an update on the Oxycodone Rx request.   She reports that the pt Is in a lot of pain.   Please advise   Heidi cb number [phone]

## 2021-12-17 ENCOUNTER — Other Ambulatory Visit: Payer: Self-pay | Admitting: Surgery

## 2021-12-17 ENCOUNTER — Other Ambulatory Visit: Payer: Self-pay | Admitting: Internal Medicine

## 2021-12-17 ENCOUNTER — Ambulatory Visit: Payer: PPO | Admitting: Internal Medicine

## 2021-12-17 DIAGNOSIS — I7121 Aneurysm of the ascending aorta, without rupture: Secondary | ICD-10-CM

## 2021-12-17 MED ORDER — OXYCODONE HCL ER 10 MG PO T12A: 10.0000 mg | EXTENDED_RELEASE_TABLET | Freq: Every day | ORAL | 0 refills | Status: DC

## 2021-12-17 NOTE — Telephone Encounter (Signed)
I addressed it via [employer].  Thanks

## 2021-12-17 NOTE — Telephone Encounter (Signed)
Great, Thank you so much!!

## 2021-12-18 NOTE — Telephone Encounter (Signed)
You are booked Monday pls advise if you want to add her on.Marland KitchenJohny Chess

## 2021-12-19 ENCOUNTER — Other Ambulatory Visit: Payer: Self-pay | Admitting: Internal Medicine

## 2021-12-19 ENCOUNTER — Ambulatory Visit (INDEPENDENT_AMBULATORY_CARE_PROVIDER_SITE_OTHER): Payer: PPO

## 2021-12-19 DIAGNOSIS — Z0000 Encounter for general adult medical examination without abnormal findings: Secondary | ICD-10-CM

## 2021-12-19 MED ORDER — OXYCODONE HCL ER 10 MG PO T12A: 10.0000 mg | EXTENDED_RELEASE_TABLET | Freq: Every day | ORAL | 0 refills | Status: AC

## 2021-12-19 NOTE — Patient Instructions (Signed)
Diane Abbott , Thank you for taking time to come for your Medicare Wellness Visit. I appreciate your ongoing commitment to your health goals. Please review the following plan we discussed and let me know if I can assist you in the future.   Screening recommendations/referrals: Colonoscopy: no longer required  Mammogram: no longer required  Bone Density: 12/27/2015 Recommended yearly ophthalmology/optometry visit for glaucoma screening and checkup Recommended yearly dental visit for hygiene and checkup  Vaccinations: Influenza vaccine: completed  Pneumococcal vaccine: completed  Tdap vaccine: 10/17/2018 Shingles vaccine: will consider     Advanced directives: yes   Conditions/risks identified: none   Next appointment: none    Preventive Care 35 Years and Older, Female Preventive care refers to lifestyle choices and visits with your health care provider that can promote health and wellness. What does preventive care include? A yearly physical exam. This is also called an annual well check. Dental exams once or twice a year. Routine eye exams. Ask your health care provider how often you should have your eyes checked. Personal lifestyle choices, including: Daily care of your teeth and gums. Regular physical activity. Eating a healthy diet. Avoiding tobacco and drug use. Limiting alcohol use. Practicing safe sex. Taking low-dose aspirin every day. Taking vitamin and mineral supplements as recommended by your health care provider. What happens during an annual well check? The services and screenings done by your health care provider during your annual well check will depend on your age, overall health, lifestyle risk factors, and family history of disease. Counseling  Your health care provider may ask you questions about your: Alcohol use. Tobacco use. Drug use. Emotional well-being. Home and relationship well-being. Sexual activity. Eating habits. History of falls. Memory  and ability to understand (cognition). Work and work Statistician. Reproductive health. Screening  You may have the following tests or measurements: Height, weight, and BMI. Blood pressure. Lipid and cholesterol levels. These may be checked every 5 years, or more frequently if you are over 67 years old. Skin check. Lung cancer screening. You may have this screening every year starting at age 46 if you have a 30-pack-year history of smoking and currently smoke or have quit within the past 15 years. Fecal occult blood test (FOBT) of the stool. You may have this test every year starting at age 90. Flexible sigmoidoscopy or colonoscopy. You may have a sigmoidoscopy every 5 years or a colonoscopy every 10 years starting at age 106. Hepatitis C blood test. Hepatitis B blood test. Sexually transmitted disease (STD) testing. Diabetes screening. This is done by checking your blood sugar (glucose) after you have not eaten for a while (fasting). You may have this done every 1-3 years. Bone density scan. This is done to screen for osteoporosis. You may have this done starting at age 22. Mammogram. This may be done every 1-2 years. Talk to your health care provider about how often you should have regular mammograms. Talk with your health care provider about your test results, treatment options, and if necessary, the need for more tests. Vaccines  Your health care provider may recommend certain vaccines, such as: Influenza vaccine. This is recommended every year. Tetanus, diphtheria, and acellular pertussis (Tdap, Td) vaccine. You may need a Td booster every 10 years. Zoster vaccine. You may need this after age 72. Pneumococcal 13-valent conjugate (PCV13) vaccine. One dose is recommended after age 58. Pneumococcal polysaccharide (PPSV23) vaccine. One dose is recommended after age 49. Talk to your health care provider about which screenings and  vaccines you need and how often you need them. This  information is not intended to replace advice given to you by your health care provider. Make sure you discuss any questions you have with your health care provider. Document Released: 05/17/2015 Document Revised: 01/08/2016 Document Reviewed: 02/19/2015 Elsevier Interactive Patient Education  2017 [employer] Prevention in the Home Falls can cause injuries. They can happen to people of all ages. There are many things you can do to make your home safe and to help prevent falls. What can I do on the outside of my home? Regularly fix the edges of walkways and driveways and fix any cracks. Remove anything that might make you trip as you walk through a door, such as a raised step or threshold. Trim any bushes or trees on the path to your home. Use bright outdoor lighting. Clear any walking paths of anything that might make someone trip, such as rocks or tools. Regularly check to see if handrails are loose or broken. Make sure that both sides of any steps have handrails. Any raised decks and porches should have guardrails on the edges. Have any leaves, snow, or ice cleared regularly. Use sand or salt on walking paths during winter. Clean up any spills in your garage right away. This includes oil or grease spills. What can I do in the bathroom? Use night lights. Install grab bars by the toilet and in the tub and shower. Do not use towel bars as grab bars. Use non-skid mats or decals in the tub or shower. If you need to sit down in the shower, use a plastic, non-slip stool. Keep the floor dry. Clean up any water that spills on the floor as soon as it happens. Remove soap buildup in the tub or shower regularly. Attach bath mats securely with double-sided non-slip rug tape. Do not have throw rugs and other things on the floor that can make you trip. What can I do in the bedroom? Use night lights. Make sure that you have a light by your bed that is easy to reach. Do not use any sheets or  blankets that are too big for your bed. They should not hang down onto the floor. Have a firm chair that has side arms. You can use this for support while you get dressed. Do not have throw rugs and other things on the floor that can make you trip. What can I do in the kitchen? Clean up any spills right away. Avoid walking on wet floors. Keep items that you use a lot in easy-to-reach places. If you need to reach something above you, use a strong step stool that has a grab bar. Keep electrical cords out of the way. Do not use floor polish or wax that makes floors slippery. If you must use wax, use non-skid floor wax. Do not have throw rugs and other things on the floor that can make you trip. What can I do with my stairs? Do not leave any items on the stairs. Make sure that there are handrails on both sides of the stairs and use them. Fix handrails that are broken or loose. Make sure that handrails are as long as the stairways. Check any carpeting to make sure that it is firmly attached to the stairs. Fix any carpet that is loose or worn. Avoid having throw rugs at the top or bottom of the stairs. If you do have throw rugs, attach them to the floor with carpet tape. Make  sure that you have a light switch at the top of the stairs and the bottom of the stairs. If you do not have them, ask someone to add them for you. What else can I do to help prevent falls? Wear shoes that: Do not have high heels. Have rubber bottoms. Are comfortable and fit you well. Are closed at the toe. Do not wear sandals. If you use a stepladder: Make sure that it is fully opened. Do not climb a closed stepladder. Make sure that both sides of the stepladder are locked into place. Ask someone to hold it for you, if possible. Clearly mark and make sure that you can see: Any grab bars or handrails. First and last steps. Where the edge of each step is. Use tools that help you move around (mobility aids) if they are  needed. These include: Canes. Walkers. Scooters. Crutches. Turn on the lights when you go into a dark area. Replace any light bulbs as soon as they burn out. Set up your furniture so you have a clear path. Avoid moving your furniture around. If any of your floors are uneven, fix them. If there are any pets around you, be aware of where they are. Review your medicines with your doctor. Some medicines can make you feel dizzy. This can increase your chance of falling. Ask your doctor what other things that you can do to help prevent falls. This information is not intended to replace advice given to you by your health care provider. Make sure you discuss any questions you have with your health care provider. Document Released: 02/14/2009 Document Revised: 09/26/2015 Document Reviewed: 05/25/2014 Elsevier Interactive Patient Education  2017 [employer].

## 2021-12-19 NOTE — Progress Notes (Cosign Needed Addendum)
Subjective:   Diane Abbott is a 87 y.o. female who presents for an Initial Medicare Annual Wellness Visit.   I connected with Dot Legault  today by telephone and verified that I am speaking with the correct person using two identifiers. Location patient: home Location provider: work Persons participating in the virtual visit: patient, provider.   I discussed the limitations, risks, security and privacy concerns of performing an evaluation and management service by telephone and the availability of in person appointments. I also discussed with the patient that there may be a patient responsible charge related to this service. The patient expressed understanding and verbally consented to this telephonic visit.    Interactive audio and video telecommunications were attempted between this provider and patient, however failed, due to patient having technical difficulties OR patient did not have access to video capability.  We continued and completed visit with audio only.    Review of Systems     Cardiac Risk Factors include: advanced age (>27mn, >>38women)     Objective:    Today's Vitals   There is no height or weight on file to calculate BMI.     12/19/2021    3:22 PM 01/14/2021    1:58 PM 10/17/2018    9:03 AM 06/29/2016    3:15 PM 06/29/2016    6:04 AM 06/24/2016   12:42 PM 08/06/2015    2:15 PM  Advanced Directives  Does Patient Have a Medical Advance Directive? Yes Yes Yes Yes  Yes Yes  Type of AParamed[place]of [place] will  Living will [place] will  HAdaLiving will   Does patient want to make changes to medical advance directive?   No - Patient declined No - Patient declined     Copy of HH[place]in Chart? No - copy requested   No - copy requested No - copy requested No - copy requested     Current Medications (verified) Outpatient Encounter Medications as of 12/19/2021   Medication Sig   Ferric Maltol (ACCRUFER) 30 MG CAPS 1 po bid   fluconazole (DIFLUCAN) 100 MG tablet Take 2 tabs on day#1, then 1 tab daily on Days #2-10   ipratropium (ATROVENT) 0.06 % nasal spray Place 2 sprays into the nose 3 (three) times daily.   ipratropium-albuterol (DUONEB) 0.5-2.5 (3) MG/3ML SOLN Take 3 mLs by nebulization every 6 (six) hours as needed.   oxyCODONE (OXYCONTIN) 10 mg 12 hr tablet Take 1 tablet (10 mg total) by mouth daily.   rOPINIRole (REQUIP) 4 MG tablet Take 1 tablet (4 mg total) by mouth 3 (three) times daily.   triamcinolone cream (KENALOG) 0.5 % Apply 1 application topically 4 (four) times daily.   amLODipine (NORVASC) 2.5 MG tablet TAKE 1 TABLET BY MOUTH EVERY DAY (Patient not taking: Reported on 12/19/2021)   Fluticasone-Umeclidin-Vilant (TRELEGY ELLIPTA) 100-62.5-25 MCG/INH AEPB 1 inh daily (Patient not taking: Reported on 12/19/2021)   pantoprazole (PROTONIX) 40 MG tablet Take 1 tablet (40 mg total) by mouth daily. (Patient not taking: Reported on 12/19/2021)   triazolam (HALCION) 0.125 MG tablet Take 1 tablet (0.125 mg total) by mouth at bedtime as needed for sleep. (Patient not taking: Reported on 12/19/2021)   [DISCONTINUED] oxyCODONE (OXYCONTIN) 10 mg 12 hr tablet Take 1 tablet (10 mg total) by mouth daily.   No facility-administered encounter medications on file as of 12/19/2021.    Allergies (verified) Acetaminophen, Codeine, Pramipexole dihydrochloride, Pramipexole dihydrochloride,  Rofecoxib, Venlafaxine, Arava [leflunomide], Clonazepam, Diclofenac sodium, Sulfa antibiotics, Alprazolam, Darvocet [propoxyphene n-acetaminophen], Duloxetine, Gabapentin, Latex, Morphine and related, Nortriptyline hcl, Penicillins, and Prednisone   History: Past Medical History:  Diagnosis Date   Adrenal insufficiency ([place])    Anemia    Anginal pain ([place])    "comes and goes has occured since 2012"   Blood dyscrasia    "problems with platelets, red cells and white cells  being low"   Bronchitis Jan 2013-March 2013   severe   Cancer [facility])    skin cancer on face   Cataracts, bilateral    more in left than right   COPD (chronic obstructive pulmonary disease) ([place])    Depression    Esophagitis    Fatty liver    Fibromyalgia    Fibromyalgia    Foot pain, left 08/26/2016   2/18 1st MTP - gout vs other   GERD (gastroesophageal reflux disease)    Heart murmur    History of blood clots 2008   below knee post trauma. Dilated [facility] no clotting disorder felt to be present.   History of echocardiogram    Echo 11/16: Mild LVH, EF 60-65%, normal wall motion, grade 1 diastolic dysfunction, bioprosthetic AVR with mean gradient 21 mmHg and no regurgitation, proximal aortic arch aneurysm 49 mm, moderate TR, PASP 31 mmHg   Hypertension    IBS (irritable bowel syndrome)    Leaky heart valve    Osteoarthritis    Osteoarthritis of left shoulder 12/07/2011   Osteopenia    Pulmonary embolism ([facility])    RA (rheumatoid arthritis) ([facility])    Shortness of breath    attributed to aneurysm   Sliding hiatal hernia    Urinary leakage    when coughing   Vertigo    hx of   Vitamin B 12 deficiency    Vitamin D deficiency    Past Surgical History:  Procedure Laterality Date   ABDOMINAL HYSTERECTOMY     AORTA SURGERY  12/2012   Resection of aneurysm and valve replacement   APPENDECTOMY  1956   BENTALL PROCEDURE N/A 09/29/2012   Procedure: BENTALL PROCEDURE;  Surgeon: Gaye Pollack, MD;  Location: [place];  Service: Open Heart Surgery;  Laterality: N/A;  WITH CIRC ARREST   BILATERAL OOPHORECTOMY     BIOPSY  01/16/2021   Procedure: BIOPSY;  Surgeon: Sharyn Creamer, MD;  Location[facility];  Service: Gastroenterology;;   BREAST BIOPSY Left    CARDIAC CATHETERIZATION  09/15/12   no PCI   CATARACT EXTRACTION Bilateral    CHOLECYSTECTOMY  2003   COLONOSCOPY     COLONOSCOPY WITH PROPOFOL N/A 01/16/2021   Procedure: COLONOSCOPY WITH PROPOFOL;  Surgeon: Sharyn Creamer,  MD;  Location: [place];  Service: Gastroenterology;  Laterality: N/A;   COSMETIC SURGERY     Tummy tuck   ESOPHAGOGASTRODUODENOSCOPY (EGD) WITH PROPOFOL N/A 01/15/2021   Procedure: ESOPHAGOGASTRODUODENOSCOPY (EGD) WITH PROPOFOL;  Surgeon: Sharyn Creamer, MD;  Location: [place];  Service: Gastroenterology;  Laterality: N/A;   HOT HEMOSTASIS N/A 01/16/2021   Procedure: HOT HEMOSTASIS (ARGON PLASMA COAGULATION/BICAP);  Surgeon: Sharyn Creamer, MD;  Location: [place];  Service: Gastroenterology;  Laterality: N/A;   INTRAOPERATIVE TRANSESOPHAGEAL ECHOCARDIOGRAM N/A 09/29/2012   Procedure: INTRAOPERATIVE TRANSESOPHAGEAL ECHOCARDIOGRAM;  Surgeon: Gaye Pollack, MD;  Location: [place] [facility];  Service: Open Heart Surgery;  Laterality: N/A;   JOINT REPLACEMENT Bilateral    REPLACEMENT ASCENDING AORTA N/A 06/29/2016   Procedure: AORTIC ARCH REPLACEMENT  WITH CIRC ARREST (N/A) - RIGHT AXILLARY CANNULATION  AORTO-AORTIC 28 HEMASHIELD GRAFT WITH AORTO-INNOMINATE AND AORTO-LEFT CAROTID ANASTAMOSIS;  Surgeon: Gaye Pollack, MD;  Location: [place] [facility];  Service: Open Heart Surgery;  Laterality: N/A;  RIGHT AXILLARY CANNULATION  BILATERAL RADIAL A-LINE   SKIN CANCER EXCISION Right    cheek   TEE WITHOUT CARDIOVERSION N/A 06/29/2016   Procedure: TRANSESOPHAGEAL ECHOCARDIOGRAM (TEE);  Surgeon: Gaye Pollack, MD;  Location: [place];  Service: Open Heart Surgery;  Laterality: N/A;   TONSILLECTOMY  1941   TOTAL KNEE ARTHROPLASTY     bilateral   TOTAL SHOULDER ARTHROPLASTY  12/07/2011   Procedure: TOTAL SHOULDER ARTHROPLASTY;  Surgeon: Johnny Bridge, MD;  Location: [place];  Service: Orthopedics;  Laterality: Left;  left total shoulder artthroplasty   WRIST SURGERY     bilateral   Family History  Problem Relation Age of Onset   Ovarian cancer Mother    Diabetes Mother    COPD Father    Heart disease Maternal Grandmother    Clotting disorder Other        Father, brother, sister, and daughter all had deep venous  thrombosis   Breast cancer Other    Pancreatic cancer Paternal Uncle    Colon cancer Neg Hx    Social History   Socioeconomic History   Marital status: Divorced    Spouse name: Not on file   Number of children: Not on file   Years of education: Not on file   Highest education level: Not on file  Occupational History   Occupation: retired    Fish farm manager: RETIRED  Tobacco Use   Smoking status: Never   Smokeless tobacco: Never   Tobacco comments:    She describes COPD related to secondhand exposure from her family and husband  Vaping Use   Vaping Use: Never used  Substance and Sexual Activity   Alcohol use: Not Currently    Comment: occ    Drug use: Never   Sexual activity: Not on file  Other Topics Concern   Not on file  Social History Narrative   Not on file   Social Determinants of Health   Financial Resource Strain: Low Risk  (12/19/2021)   Overall Financial Resource Strain (CARDIA)    Difficulty of Paying Living Expenses: Not hard at all  Food Insecurity: No Food Insecurity (12/19/2021)   Hunger Vital Sign    Worried About Running Out of Food in the Last Year: Never true    [place] in the Last Year: Never true  Transportation Needs: No Transportation Needs (12/19/2021)   PRAPARE - Hydrologist (Medical): No    Lack of Transportation (Non-Medical): No  Physical Activity: Inactive (12/19/2021)   Exercise Vital Sign    Days of Exercise per Week: 0 days    Minutes of Exercise per Session: 0 min  Stress: No Stress Concern Present (12/19/2021)   [place]    Feeling of Stress : Not at all  Social Connections: Socially Isolated (12/19/2021)   Social Connection and Isolation Panel [NHANES]    Frequency of Communication with Friends and Family: Twice a week    Frequency of Social Gatherings with Friends and Family: Twice a week    Attends Religious Services: Never    Building surveyor or Organizations: No    Attends Archivist Meetings: Never    Marital Status: Divorced  Tobacco Counseling Counseling given: Not Answered Tobacco comments: She describes COPD related to secondhand exposure from her family and husband   Clinical Intake:  Pre-visit preparation completed: Yes  Pain : No/denies pain     Nutritional Risks: None Diabetes: No  How often do you need to have someone help you when you read instructions, pamphlets, or other written materials from your doctor or pharmacy?: 1 - Never What is the last grade level you completed in school?: [place]   Interpreter Needed?: No  Information entered by :: L.Wilson,LPN   Activities of Daily Living    12/19/2021    3:24 PM  In your present state of health, do you have any difficulty performing the following activities:  Hearing? 0  Vision? 0  Difficulty concentrating or making decisions? 0  Walking or climbing stairs? 1  Dressing or bathing? 1  Doing errands, shopping? 1  Comment daughter helps with ADLS  Preparing Food and eating ? Y  Using the Toilet? Y  In the past six months, have you accidently leaked urine? Y  Do you have problems with loss of bowel control? Y  Managing your Medications? Y  Housekeeping or managing your Housekeeping? Y    Patient Care Team: Cassandria Anger, MD as PCP - General Tamala Julian Lynnell Dike, MD as PCP - Cardiology (Cardiology) Carroll Kinds, NP (Inactive) as Nurse Practitioner (Nurse Practitioner) Gaye Pollack, MD as Attending Physician (Cardiothoracic Surgery) Curt Bears, MD (Hematology and Oncology) Larey Dresser, MD as Attending Physician (Cardiology) Charlotte Crumb, MD as Consulting Physician (Orthopedic Surgery) Donato Heinz, MD as Consulting Physician (Nephrology) Consuella Lose, MD as Consulting Physician (Neurosurgery) Sharyn Creamer, MD as Consulting Physician (Gastroenterology)  Indicate  any recent Medical Services you may have received from other than [facility] providers in the past year (date may be approximate).     Assessment:   This is a routine wellness examination for [place].  Hearing/Vision screen Vision Screening - Comments:: Declined due to health   Dietary issues and exercise activities discussed: Current Exercise Habits: The patient does not participate in regular exercise at present, Exercise limited by: neurologic condition(s)   Goals Addressed   None    Depression Screen    12/19/2021    3:24 PM 12/19/2021    3:21 PM 08/13/2021    1:26 PM 02/11/2021    2:47 PM 08/26/2020    1:31 PM 12/08/2016   11:29 AM 12/02/2015    3:09 PM  PHQ 2/9 Scores  PHQ - 2 Score '1 1 2 '$ 0 2 2 0  PHQ- 9 Score    '1 4 8     '$ Fall Risk    12/19/2021    3:23 PM 08/13/2021    1:26 PM 08/26/2020    1:32 PM 08/25/2019    9:52 AM 12/14/2017    7:51 AM  Fall Risk   Falls in the past year? 0 '1 1 1 '$ No  Number falls in past yr: 0 '1 1 1   '$ Injury with Fall? 0 '1 1 1   '$ Risk for fall due to :   History of fall(s)    Follow up Falls evaluation completed;Education provided   Falls evaluation completed     [place]:  Any stairs in or around the home? Yes  If so, are there any without handrails? No  Home free of loose throw rugs in walkways, pet beds, electrical cords, etc? Yes  Adequate lighting in your  home to reduce risk of falls? Yes   ASSISTIVE DEVICES UTILIZED TO PREVENT FALLS:  Life alert? No  Use of a cane, walker or w/c? No  Grab bars in the bathroom? No  Shower chair or bench in shower? No  Elevated toilet seat or a handicapped toilet? No     Cognitive Function:  Normal cognitive status assessed by telephone conversation by this Nurse Health Advisor. No abnormalities found.        12/19/2021    3:24 PM  6CIT Screen  What Year? 0 points  What month? 0 points  What time? 0 points  Count back from 20 2 points  Months in reverse 2  points  Repeat phrase 2 points  Total Score 6 points    Immunizations Immunization History  Administered Date(s) Administered   Influenza Whole 03/10/2007, 02/01/2008   PFIZER(Purple Top)SARS-COV-2 Vaccination 08/07/2019, 08/30/2019   Pneumococcal Conjugate-13 03/23/2013   Pneumococcal Polysaccharide-23 02/03/2010, 01/22/2020   Td 03/21/2012   Tdap 10/17/2018   Zoster, Live 11/04/2010    TDAP status: Up to date  Flu Vaccine status: Up to date  Pneumococcal vaccine status: Up to date  Covid-19 vaccine status: Completed vaccines  Qualifies for Shingles Vaccine? Yes   Zostavax completed No   Shingrix Completed?: No.    Education has been provided regarding the importance of this vaccine. Patient has been advised to call insurance company to determine out of pocket expense if they have not yet received this vaccine. Advised may also receive vaccine at local pharmacy or Health Dept. Verbalized acceptance and understanding.  Screening Tests Health Maintenance  Topic Date Due   Zoster Vaccines- Shingrix (1 of 2) Never done   COVID-19 Vaccine (3 - Pfizer risk series) 09/27/2019   TETANUS/TDAP  10/16/2028   Pneumonia Vaccine 70+ Years old  Completed   DEXA SCAN  Completed   HPV VACCINES  Aged Out   INFLUENZA VACCINE  Discontinued    Health Maintenance  Health Maintenance Due  Topic Date Due   Zoster Vaccines- Shingrix (1 of 2) Never done   COVID-19 Vaccine (3 - Pfizer risk series) 09/27/2019    Colorectal cancer screening: No longer required.   Mammogram status: No longer required due to age.  Bone Density status: Completed 12/27/2015. Results reflect: Bone density results: OSTEOPENIA. Repeat every 5 years.  Lung Cancer Screening: (Low Dose CT Chest recommended if Age 75-80 years, 30 pack-year currently smoking OR have quit w/in 15years.) does not qualify.   Lung Cancer Screening Referral: n/a  Additional Screening:  Hepatitis C Screening: does not qualify;    Vision Screening: Recommended annual ophthalmology exams for early detection of glaucoma and other disorders of the eye. Is the patient up to date with their annual eye exam?  No  Who is the provider or what is the name of the office in which the patient attends annual eye exams? Declined due to declined to health  If pt is not established with a provider, would they like to be referred to a provider to establish care? No .   Dental Screening: Recommended annual dental exams for proper oral hygiene  Community Resource Referral / Chronic Care Management: CRR required this visit?  No   CCM required this visit?  No      Plan:     I have personally reviewed and noted the following in the patient's chart:   Medical and social history Use of alcohol, tobacco or illicit drugs  Current medications and supplements including  opioid prescriptions. Patient is not currently taking opioid prescriptions. Functional ability and status Nutritional status Physical activity Advanced directives List of other physicians Hospitalizations, surgeries, and ER visits in previous 12 months Vitals Screenings to include cognitive, depression, and falls Referrals and appointments  In addition, I have reviewed and discussed with patient certain preventive protocols, quality metrics, and best practice recommendations. A written personalized care plan for preventive services as well as general preventive health recommendations were provided to patient.     Daphane Shepherd, LPN   [id number]   Nurse Notes: none    Medical screening examination/treatment/procedure(s) were performed by non-physician practitioner and as supervising physician I was immediately available for consultation/collaboration.  I agree with above. Lew Dawes, MD

## 2021-12-20 ENCOUNTER — Ambulatory Visit (HOSPITAL_BASED_OUTPATIENT_CLINIC_OR_DEPARTMENT_OTHER)
Admission: RE | Admit: 2021-12-20 | Discharge: 2021-12-20 | Disposition: A | Discharge status: Home / Self Care | Payer: PPO | Source: Ambulatory Visit | Attending: Surgery | Admitting: Surgery

## 2021-12-20 DIAGNOSIS — J984 Other disorders of lung: Secondary | ICD-10-CM | POA: Diagnosis not present

## 2021-12-20 DIAGNOSIS — I7121 Aneurysm of the ascending aorta, without rupture: Secondary | ICD-10-CM | POA: Insufficient documentation

## 2021-12-22 ENCOUNTER — Telehealth: Payer: Self-pay | Admitting: *Deleted

## 2021-12-22 ENCOUNTER — Encounter: Payer: Self-pay | Admitting: Internal Medicine

## 2021-12-22 ENCOUNTER — Telehealth (INDEPENDENT_AMBULATORY_CARE_PROVIDER_SITE_OTHER): Payer: PPO | Admitting: Internal Medicine

## 2021-12-22 DIAGNOSIS — B370 Candidal stomatitis: Secondary | ICD-10-CM | POA: Diagnosis not present

## 2021-12-22 DIAGNOSIS — R627 Adult failure to thrive: Secondary | ICD-10-CM

## 2021-12-22 DIAGNOSIS — B3781 Candidal esophagitis: Secondary | ICD-10-CM

## 2021-12-22 DIAGNOSIS — Z7189 Other specified counseling: Secondary | ICD-10-CM

## 2021-12-22 DIAGNOSIS — G894 Chronic pain syndrome: Secondary | ICD-10-CM | POA: Diagnosis not present

## 2021-12-22 DIAGNOSIS — I7121 Aneurysm of the ascending aorta, without rupture: Secondary | ICD-10-CM | POA: Diagnosis not present

## 2021-12-22 MED ORDER — FLUCONAZOLE 100 MG PO TABS: ORAL_TABLET | ORAL | 0 refills | Status: AC

## 2021-12-22 NOTE — Assessment & Plan Note (Signed)
We discussed hospice referral.  She is in agreement

## 2021-12-22 NOTE — Telephone Encounter (Signed)
Patient called w/ her CT chest results that was done on Saturday 9/19.  Mild changes with TAA at 4.8cm.  Nothing needed from CT surgery end other than if patient wants yearly surveillance.  She appreciated the call and nothing else needed at this time.  She will call me if she needs anything.

## 2021-12-22 NOTE — Telephone Encounter (Signed)
Called pt MD will do telephone visit this am../;lmb

## 2021-12-22 NOTE — Assessment & Plan Note (Signed)
Worse. The pharmacy charged the $160 for 30 OxyContin tablets.  She was not able to afford it.  She has her last OxyContin 10 mg tablet from her first 5 pill prescription that cost her $30. Regular oxycodone is much less expensive, however, the patient states that "it goes straight to her head" and she does not like this effect. She is very weak and depressed.  She is in agreement to meet with hospice. DNR

## 2021-12-22 NOTE — Assessment & Plan Note (Signed)
Relapsed Usually it takes her > 8 wks of Diflucan to get rid of it, high dose

## 2021-12-22 NOTE — Progress Notes (Signed)
Virtual Visit via Video Note  I connected with Diane Abbott on 12/22/21 at 11:00 AM EDT by a video enabled telemedicine application and verified that I am speaking with the correct person using two identifiers.   I discussed the limitations of evaluation and management by telemedicine and the availability of in person appointments. The patient expressed understanding and agreed to proceed.  I was located at our [facility] office. The patient was at home. There was no one else present in the visit.  No chief complaint on file.    History of Present Illness:  We discussed chronic pain, weakness, failure to thrive with Dot. The pharmacy charged the $160 for 30 OxyContin tablets.  She was not able to afford it.  She has her last OxyContin 10 mg tablet from her first 5 pill prescription that cost her $30. She is complaining of severe thrush in her mouth. She is very weak and depressed.  She is in agreement to meet with hospice. Review of Systems  Constitutional:  Positive for chills and weight loss. Negative for fever.  HENT:  Positive for sore throat.   Eyes:  Negative for double vision.  Respiratory:  Negative for cough and sputum production.   Cardiovascular:  Positive for chest pain. Negative for leg swelling.  Genitourinary:  Positive for frequency. Negative for dysuria and urgency.  Musculoskeletal:  Positive for back pain, falls and joint pain.  Skin:  Negative for rash.  Neurological:  Positive for weakness. Negative for seizures.  Endo/Heme/Allergies:  Bruises/bleeds easily.  Psychiatric/Behavioral:  Positive for depression. Negative for memory loss and suicidal ideas. The patient is nervous/anxious and has insomnia.      Observations/Objective: The patient appears to be in no acute distress.  She appears chronically ill.  She looks tired.  Her face shows signs of weight loss.  She is alert oriented and cooperative.  Assessment and Plan:  Problem List Items Addressed  This Visit     Aortic aneurysm, thoracic ([place])    We discussed hospice referral.  She is in agreement      Relevant Orders   Ambulatory referral to Hospice   Chronic pain    Worse. The pharmacy charged the $160 for 30 OxyContin tablets.  She was not able to afford it.  She has her last OxyContin 10 mg tablet from her first 5 pill prescription that cost her $30. Regular oxycodone is much less expensive, however, the patient states that "it goes straight to her head" and she does not like this effect. She is very weak and depressed.  She is in agreement to meet with hospice. DNR      Relevant Orders   Ambulatory referral to Hospice   DNR (do not resuscitate) discussion    Discussed again and confirmed.      Relevant Orders   Ambulatory referral to Hospice   Failure to thrive in adult - Primary    We discussed hospice referral.  She is in agreement      Relevant Orders   Ambulatory referral to Hospice   Thrush of mouth and esophagus ([place])    Relapsed Usually it takes her > 8 wks of Diflucan to get rid of it, high dose      Relevant Medications   fluconazole (DIFLUCAN) 100 MG tablet     Meds ordered this encounter  Medications  . fluconazole (DIFLUCAN) 100 MG tablet    Sig: Take 2 tabs on day#1-7, then 1 tab daily on Days #  8-30    Dispense:  38 tablet    Refill:  0     Follow Up Instructions:    I discussed the assessment and treatment plan with the patient. The patient was provided an opportunity to ask questions and all were answered. The patient agreed with the plan and demonstrated an understanding of the instructions.   The patient was advised to call back or seek an in-person evaluation if the symptoms worsen or if the condition fails to improve as anticipated.  I provided face-to-face time during this encounter. We were at different locations.   Walker Kehr, MD

## 2021-12-22 NOTE — Assessment & Plan Note (Signed)
Discussed again and confirmed.

## 2021-12-24 ENCOUNTER — Telehealth: Payer: Self-pay

## 2021-12-24 NOTE — Telephone Encounter (Signed)
Spoke to [employer] nurse, who wanted to know if Dr. Alain Marion wants to be the pt attending hospices physician and would he like for their attending physician, to then be the provider who will attend the pt comfort care symptoms and manage her medication instead.

## 2021-12-25 NOTE — Telephone Encounter (Signed)
It has been addressed already.  Thank you

## 2022-01-19 ENCOUNTER — Telehealth: Payer: Self-pay | Admitting: *Deleted

## 2022-01-19 NOTE — Telephone Encounter (Signed)
Rec'd fax notification of Hospice Death. Date Admission 01/05/2022. Date of Death January 28, 2022 @ 11:09pm

## 2022-01-21 NOTE — Telephone Encounter (Signed)
Noted Sympathy card sent to [facility]

## 2022-01-28 NOTE — Telephone Encounter (Signed)
Spoke with representative from [facility] today and they are requesting Dr.Plotnikov sign off on the death certificate on the [place] [facility] system. Number is [id number].   Date of Death 02/06/2022 Time of Death 11:09 PM

## 2022-01-30 ENCOUNTER — Telehealth: Payer: Self-pay | Admitting: Internal Medicine

## 2022-01-30 NOTE — Telephone Encounter (Signed)
It is taken care of.  Thank you

## 2022-01-30 NOTE — Telephone Encounter (Signed)
[facility] called and said that the Death Certificate that they sent on the 15th of this month has still not been signed - Please sign ASAP

## 2022-01-30 NOTE — Telephone Encounter (Signed)
Done. Thanks.

## 2022-02-01 DEATH — deceased

## 2022-02-16 ENCOUNTER — Ambulatory Visit: Payer: PPO | Admitting: Internal Medicine
